# Patient Record
Sex: Female | Born: 1950 | Race: Black or African American | Hispanic: No | Smoking: Never smoker
Health system: Southern US, Community
[De-identification: ages and names within clinical notes are randomized; demographics above are authoritative.]

## PROBLEM LIST (undated history)

## (undated) DIAGNOSIS — R000 Tachycardia, unspecified: Secondary | ICD-10-CM

## (undated) DIAGNOSIS — I10 Essential (primary) hypertension: Secondary | ICD-10-CM

## (undated) DIAGNOSIS — D649 Anemia, unspecified: Secondary | ICD-10-CM

## (undated) DIAGNOSIS — G20A1 Parkinson's disease without dyskinesia, without mention of fluctuations: Secondary | ICD-10-CM

## (undated) DIAGNOSIS — R600 Localized edema: Secondary | ICD-10-CM

## (undated) DIAGNOSIS — M5020 Other cervical disc displacement, unspecified cervical region: Secondary | ICD-10-CM

## (undated) DIAGNOSIS — F32A Depression, unspecified: Secondary | ICD-10-CM

## (undated) DIAGNOSIS — E039 Hypothyroidism, unspecified: Secondary | ICD-10-CM

## (undated) DIAGNOSIS — T8859XA Other complications of anesthesia, initial encounter: Secondary | ICD-10-CM

## (undated) DIAGNOSIS — T4145XA Adverse effect of unspecified anesthetic, initial encounter: Secondary | ICD-10-CM

## (undated) DIAGNOSIS — E538 Deficiency of other specified B group vitamins: Secondary | ICD-10-CM

## (undated) DIAGNOSIS — Z923 Personal history of irradiation: Secondary | ICD-10-CM

## (undated) DIAGNOSIS — G4730 Sleep apnea, unspecified: Secondary | ICD-10-CM

## (undated) DIAGNOSIS — K219 Gastro-esophageal reflux disease without esophagitis: Secondary | ICD-10-CM

## (undated) DIAGNOSIS — K5792 Diverticulitis of intestine, part unspecified, without perforation or abscess without bleeding: Secondary | ICD-10-CM

## (undated) DIAGNOSIS — L732 Hidradenitis suppurativa: Secondary | ICD-10-CM

## (undated) DIAGNOSIS — C50019 Malignant neoplasm of nipple and areola, unspecified female breast: Secondary | ICD-10-CM

## (undated) DIAGNOSIS — M1990 Unspecified osteoarthritis, unspecified site: Secondary | ICD-10-CM

## (undated) DIAGNOSIS — M5030 Other cervical disc degeneration, unspecified cervical region: Secondary | ICD-10-CM

## (undated) DIAGNOSIS — G629 Polyneuropathy, unspecified: Secondary | ICD-10-CM

## (undated) DIAGNOSIS — G20 Parkinson's disease: Secondary | ICD-10-CM

## (undated) DIAGNOSIS — G4733 Obstructive sleep apnea (adult) (pediatric): Secondary | ICD-10-CM

## (undated) DIAGNOSIS — I4891 Unspecified atrial fibrillation: Secondary | ICD-10-CM

## (undated) DIAGNOSIS — Z9010 Acquired absence of unspecified breast and nipple: Secondary | ICD-10-CM

## (undated) DIAGNOSIS — M109 Gout, unspecified: Secondary | ICD-10-CM

## (undated) DIAGNOSIS — Z9221 Personal history of antineoplastic chemotherapy: Secondary | ICD-10-CM

## (undated) DIAGNOSIS — F329 Major depressive disorder, single episode, unspecified: Secondary | ICD-10-CM

## (undated) DIAGNOSIS — R0600 Dyspnea, unspecified: Secondary | ICD-10-CM

## (undated) DIAGNOSIS — Z90710 Acquired absence of both cervix and uterus: Secondary | ICD-10-CM

## (undated) DIAGNOSIS — C50919 Malignant neoplasm of unspecified site of unspecified female breast: Secondary | ICD-10-CM

## (undated) DIAGNOSIS — Z98891 History of uterine scar from previous surgery: Secondary | ICD-10-CM

## (undated) DIAGNOSIS — E669 Obesity, unspecified: Secondary | ICD-10-CM

## (undated) DIAGNOSIS — F419 Anxiety disorder, unspecified: Secondary | ICD-10-CM

## (undated) HISTORY — PX: OTHER SURGICAL HISTORY: SHX169

## (undated) HISTORY — PX: EYE SURGERY: SHX253

## (undated) HISTORY — PX: NEUROPLASTY / TRANSPOSITION ULNAR NERVE AT ELBOW: SUR895

## (undated) HISTORY — PX: SPINE SURGERY: SHX786

## (undated) HISTORY — PX: ABDOMINAL HYSTERECTOMY: SHX81

## (undated) HISTORY — PX: REDUCTION MAMMAPLASTY: SUR839

## (undated) HISTORY — PX: LAPAROSCOPIC GASTRIC BYPASS: SUR771

## (undated) HISTORY — PX: BREAST CYST ASPIRATION: SHX578

## (undated) HISTORY — PX: BREAST BIOPSY: SHX20

## (undated) HISTORY — PX: CHOLECYSTECTOMY: SHX55

## (undated) HISTORY — PX: MASTECTOMY: SHX3

## (undated) HISTORY — PX: OOPHORECTOMY: SHX86

## (undated) HISTORY — DX: Malignant neoplasm of unspecified site of unspecified female breast: C50.919

## (undated) HISTORY — PX: MASTECTOMY MODIFIED RADICAL: SHX5962

## (undated) HISTORY — PX: ESOPHAGOGASTRODUODENOSCOPY: SHX1529

---
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
## 1985-09-19 DIAGNOSIS — Z98891 History of uterine scar from previous surgery: Secondary | ICD-10-CM

## 1985-09-19 HISTORY — DX: History of uterine scar from previous surgery: Z98.891

## 1989-09-19 DIAGNOSIS — C50919 Malignant neoplasm of unspecified site of unspecified female breast: Secondary | ICD-10-CM

## 1989-09-19 HISTORY — DX: Malignant neoplasm of unspecified site of unspecified female breast: C50.919

## 1994-09-19 DIAGNOSIS — Z90710 Acquired absence of both cervix and uterus: Secondary | ICD-10-CM

## 1994-09-19 HISTORY — DX: Acquired absence of both cervix and uterus: Z90.710

## 2000-09-19 DIAGNOSIS — M5020 Other cervical disc displacement, unspecified cervical region: Secondary | ICD-10-CM

## 2000-09-19 HISTORY — PX: BACK SURGERY: SHX140

## 2000-09-19 HISTORY — DX: Other cervical disc displacement, unspecified cervical region: M50.20

## 2001-05-23 ENCOUNTER — Encounter: Admission: RE | Admit: 2001-05-23 | Discharge: 2001-05-23 | Payer: Self-pay | Admitting: *Deleted

## 2001-09-11 ENCOUNTER — Encounter: Payer: Self-pay | Admitting: Neurosurgery

## 2001-09-17 ENCOUNTER — Ambulatory Visit (HOSPITAL_COMMUNITY): Admission: RE | Admit: 2001-09-17 | Discharge: 2001-09-18 | Payer: Self-pay | Admitting: Neurosurgery

## 2001-09-17 ENCOUNTER — Encounter: Payer: Self-pay | Admitting: Neurosurgery

## 2004-10-20 ENCOUNTER — Ambulatory Visit: Payer: Self-pay | Admitting: Surgery

## 2005-03-28 ENCOUNTER — Ambulatory Visit: Payer: Self-pay | Admitting: Surgery

## 2005-05-06 ENCOUNTER — Ambulatory Visit: Payer: Self-pay | Admitting: Urology

## 2005-05-10 ENCOUNTER — Ambulatory Visit: Payer: Self-pay | Admitting: Unknown Physician Specialty

## 2005-11-09 ENCOUNTER — Ambulatory Visit: Payer: Self-pay | Admitting: Surgery

## 2006-05-10 ENCOUNTER — Ambulatory Visit: Payer: Self-pay | Admitting: Surgery

## 2006-10-16 ENCOUNTER — Ambulatory Visit: Payer: Self-pay | Admitting: Internal Medicine

## 2006-11-09 ENCOUNTER — Emergency Department: Payer: Self-pay | Admitting: Emergency Medicine

## 2006-11-23 ENCOUNTER — Ambulatory Visit: Payer: Self-pay | Admitting: Surgery

## 2007-12-10 ENCOUNTER — Ambulatory Visit: Payer: Self-pay | Admitting: Surgery

## 2008-10-10 ENCOUNTER — Ambulatory Visit: Payer: Self-pay | Admitting: Internal Medicine

## 2008-12-24 ENCOUNTER — Ambulatory Visit: Payer: Self-pay | Admitting: Surgery

## 2009-01-17 DIAGNOSIS — T8859XA Other complications of anesthesia, initial encounter: Secondary | ICD-10-CM

## 2009-01-17 HISTORY — DX: Other complications of anesthesia, initial encounter: T88.59XA

## 2009-01-26 HISTORY — PX: ROUX-EN-Y GASTRIC BYPASS: SHX1104

## 2009-03-09 ENCOUNTER — Ambulatory Visit: Payer: Self-pay | Admitting: Internal Medicine

## 2009-10-28 ENCOUNTER — Ambulatory Visit: Payer: Self-pay | Admitting: Internal Medicine

## 2009-11-11 ENCOUNTER — Ambulatory Visit: Payer: Self-pay | Admitting: Internal Medicine

## 2009-11-13 ENCOUNTER — Ambulatory Visit: Payer: Self-pay | Admitting: Internal Medicine

## 2009-11-16 ENCOUNTER — Ambulatory Visit: Payer: Self-pay | Admitting: Internal Medicine

## 2009-11-19 ENCOUNTER — Ambulatory Visit: Payer: Self-pay | Admitting: Internal Medicine

## 2009-11-23 ENCOUNTER — Ambulatory Visit: Payer: Self-pay | Admitting: Internal Medicine

## 2009-11-26 ENCOUNTER — Ambulatory Visit: Payer: Self-pay | Admitting: Internal Medicine

## 2009-11-30 ENCOUNTER — Ambulatory Visit: Payer: Self-pay | Admitting: Internal Medicine

## 2009-12-03 ENCOUNTER — Ambulatory Visit: Payer: Self-pay | Admitting: Internal Medicine

## 2009-12-07 ENCOUNTER — Ambulatory Visit: Payer: Self-pay | Admitting: Internal Medicine

## 2009-12-11 ENCOUNTER — Ambulatory Visit: Payer: Self-pay | Admitting: Internal Medicine

## 2009-12-15 ENCOUNTER — Ambulatory Visit: Payer: Self-pay | Admitting: Internal Medicine

## 2009-12-18 ENCOUNTER — Ambulatory Visit: Payer: Self-pay | Admitting: Internal Medicine

## 2009-12-22 ENCOUNTER — Ambulatory Visit: Payer: Self-pay | Admitting: Internal Medicine

## 2009-12-25 ENCOUNTER — Ambulatory Visit: Payer: Self-pay | Admitting: Internal Medicine

## 2009-12-29 ENCOUNTER — Ambulatory Visit: Payer: Self-pay | Admitting: Internal Medicine

## 2009-12-30 ENCOUNTER — Ambulatory Visit: Payer: Self-pay | Admitting: Surgery

## 2010-01-04 ENCOUNTER — Ambulatory Visit: Payer: Self-pay | Admitting: Internal Medicine

## 2010-01-07 ENCOUNTER — Ambulatory Visit: Payer: Self-pay | Admitting: Internal Medicine

## 2010-01-12 ENCOUNTER — Ambulatory Visit: Payer: Self-pay | Admitting: Internal Medicine

## 2010-01-19 ENCOUNTER — Ambulatory Visit: Payer: Self-pay | Admitting: Internal Medicine

## 2010-01-26 ENCOUNTER — Ambulatory Visit: Payer: Self-pay | Admitting: Internal Medicine

## 2010-02-02 ENCOUNTER — Ambulatory Visit: Payer: Self-pay | Admitting: Internal Medicine

## 2010-02-12 ENCOUNTER — Ambulatory Visit: Payer: Self-pay | Admitting: Internal Medicine

## 2010-03-26 ENCOUNTER — Ambulatory Visit: Payer: Self-pay | Admitting: Internal Medicine

## 2010-08-20 ENCOUNTER — Ambulatory Visit: Payer: Self-pay | Admitting: Internal Medicine

## 2010-09-17 ENCOUNTER — Ambulatory Visit: Payer: Self-pay | Admitting: Internal Medicine

## 2010-09-19 ENCOUNTER — Ambulatory Visit: Payer: Self-pay | Admitting: Internal Medicine

## 2010-09-24 ENCOUNTER — Ambulatory Visit: Payer: Self-pay | Admitting: Internal Medicine

## 2010-10-01 ENCOUNTER — Ambulatory Visit: Payer: Self-pay | Admitting: Internal Medicine

## 2010-10-18 ENCOUNTER — Ambulatory Visit: Payer: Self-pay | Admitting: Unknown Physician Specialty

## 2010-10-20 ENCOUNTER — Ambulatory Visit: Payer: Self-pay | Admitting: Internal Medicine

## 2010-10-22 LAB — PATHOLOGY REPORT

## 2010-11-18 ENCOUNTER — Ambulatory Visit: Payer: Self-pay | Admitting: Internal Medicine

## 2010-12-19 ENCOUNTER — Ambulatory Visit: Payer: Self-pay | Admitting: Internal Medicine

## 2010-12-30 ENCOUNTER — Ambulatory Visit: Payer: Self-pay | Admitting: Internal Medicine

## 2011-01-05 ENCOUNTER — Ambulatory Visit: Payer: Self-pay | Admitting: Surgery

## 2011-01-18 ENCOUNTER — Ambulatory Visit: Payer: Self-pay

## 2011-01-18 ENCOUNTER — Ambulatory Visit: Payer: Self-pay | Admitting: Internal Medicine

## 2011-02-24 ENCOUNTER — Ambulatory Visit: Payer: Self-pay | Admitting: Internal Medicine

## 2011-03-20 ENCOUNTER — Ambulatory Visit: Payer: Self-pay | Admitting: Internal Medicine

## 2011-04-20 ENCOUNTER — Ambulatory Visit: Payer: Self-pay | Admitting: Internal Medicine

## 2011-05-30 ENCOUNTER — Ambulatory Visit: Payer: Self-pay | Admitting: Internal Medicine

## 2011-06-20 ENCOUNTER — Ambulatory Visit: Payer: Self-pay | Admitting: Internal Medicine

## 2011-07-21 ENCOUNTER — Ambulatory Visit: Payer: Self-pay | Admitting: Internal Medicine

## 2011-10-17 ENCOUNTER — Ambulatory Visit: Payer: Self-pay | Admitting: Internal Medicine

## 2011-10-17 LAB — CBC CANCER CENTER
Basophil %: 1.7 %
Eosinophil %: 1.6 %
HCT: 41 % (ref 35.0–47.0)
HGB: 14.1 g/dL (ref 12.0–16.0)
Lymphocyte %: 54.8 %
MCH: 32.1 pg (ref 26.0–34.0)
Monocyte #: 1 x10 3/mm — ABNORMAL HIGH (ref 0.0–0.7)
Monocyte %: 10.9 %
Neutrophil %: 31 %
RBC: 4.39 10*6/uL (ref 3.80–5.20)
WBC: 9.2 x10 3/mm (ref 3.6–11.0)

## 2011-10-17 LAB — LACTATE DEHYDROGENASE: LDH: 270 U/L — ABNORMAL HIGH (ref 84–246)

## 2011-10-17 LAB — CREATININE, SERUM
Creatinine: 0.87 mg/dL (ref 0.60–1.30)
EGFR (Non-African Amer.): >60

## 2011-10-21 ENCOUNTER — Ambulatory Visit: Payer: Self-pay | Admitting: Internal Medicine

## 2012-01-16 ENCOUNTER — Ambulatory Visit: Payer: Self-pay | Admitting: Internal Medicine

## 2012-01-16 LAB — CBC CANCER CENTER
Basophil #: 0.1 x10 3/mm (ref 0.0–0.1)
Basophil %: 0.8 %
Eosinophil %: 2 %
HGB: 13.2 g/dL (ref 12.0–16.0)
MCH: 31 pg (ref 26.0–34.0)
MCHC: 33.2 g/dL (ref 32.0–36.0)
Monocyte #: 0.8 x10 3/mm (ref 0.2–0.9)
Monocyte %: 10 %
Neutrophil #: 2.8 x10 3/mm (ref 1.4–6.5)
Neutrophil %: 33.5 %
Platelet: 259 x10 3/mm (ref 150–440)
WBC: 8.5 x10 3/mm (ref 3.6–11.0)

## 2012-01-16 LAB — CREATININE, SERUM: Creatinine: 0.66 mg/dL (ref 0.60–1.30)

## 2012-01-16 LAB — URIC ACID: Uric Acid: 2.8 mg/dL (ref 2.6–6.0)

## 2012-01-18 ENCOUNTER — Ambulatory Visit: Payer: Self-pay | Admitting: Internal Medicine

## 2012-02-02 ENCOUNTER — Ambulatory Visit: Payer: Self-pay | Admitting: Surgery

## 2012-02-14 ENCOUNTER — Ambulatory Visit: Payer: Self-pay | Admitting: Surgery

## 2012-02-15 ENCOUNTER — Ambulatory Visit: Payer: Self-pay | Admitting: Internal Medicine

## 2012-02-15 LAB — CBC CANCER CENTER
Basophil #: 0.1 x10 3/mm (ref 0.0–0.1)
Basophil %: 0.8 %
Eosinophil #: 0.1 x10 3/mm (ref 0.0–0.7)
Eosinophil %: 1.6 %
HCT: 39 % (ref 35.0–47.0)
HGB: 13 g/dL (ref 12.0–16.0)
MCH: 31.2 pg (ref 26.0–34.0)
MCHC: 33.4 g/dL (ref 32.0–36.0)
Monocyte #: 1 x10 3/mm — ABNORMAL HIGH (ref 0.2–0.9)
Monocyte %: 12.2 %
Neutrophil #: 2.7 x10 3/mm (ref 1.4–6.5)

## 2012-02-18 ENCOUNTER — Ambulatory Visit: Payer: Self-pay | Admitting: Internal Medicine

## 2012-03-12 ENCOUNTER — Ambulatory Visit: Payer: Self-pay | Admitting: Internal Medicine

## 2012-03-26 DIAGNOSIS — F331 Major depressive disorder, recurrent, moderate: Secondary | ICD-10-CM | POA: Insufficient documentation

## 2012-04-09 HISTORY — PX: CHOLECYSTECTOMY: SHX55

## 2012-05-17 ENCOUNTER — Ambulatory Visit: Payer: Self-pay | Admitting: Internal Medicine

## 2012-05-17 LAB — CREATININE, SERUM
Creatinine: 0.87 mg/dL (ref 0.60–1.30)
EGFR (African American): >60
EGFR (Non-African Amer.): >60

## 2012-05-17 LAB — CBC CANCER CENTER
Basophil #: 0.1 x10 3/mm (ref 0.0–0.1)
Basophil %: 1 %
Eosinophil #: 0.2 x10 3/mm (ref 0.0–0.7)
Eosinophil %: 2.4 %
HCT: 41.1 % (ref 35.0–47.0)
HGB: 13.3 g/dL (ref 12.0–16.0)
Lymphocyte #: 4.5 x10 3/mm — ABNORMAL HIGH (ref 1.0–3.6)
Lymphocyte %: 54.7 %
MCH: 30.7 pg (ref 26.0–34.0)
MCHC: 32.4 g/dL (ref 32.0–36.0)
MCV: 95 fL (ref 80–100)
Monocyte #: 0.9 x10 3/mm (ref 0.2–0.9)
Monocyte %: 10.8 %
Neutrophil #: 2.5 x10 3/mm (ref 1.4–6.5)
Neutrophil %: 31.1 %
Platelet: 259 x10 3/mm (ref 150–440)
RBC: 4.33 10*6/uL (ref 3.80–5.20)
RDW: 14.7 % — ABNORMAL HIGH (ref 11.5–14.5)
WBC: 8.2 x10 3/mm (ref 3.6–11.0)

## 2012-05-17 LAB — URIC ACID: Uric Acid: 2.8 mg/dL (ref 2.6–6.0)

## 2012-05-17 LAB — LACTATE DEHYDROGENASE: LDH: 225 U/L (ref 81–234)

## 2012-05-20 ENCOUNTER — Ambulatory Visit: Payer: Self-pay | Admitting: Internal Medicine

## 2012-06-04 DIAGNOSIS — E669 Obesity, unspecified: Secondary | ICD-10-CM | POA: Insufficient documentation

## 2012-06-12 ENCOUNTER — Ambulatory Visit: Payer: Self-pay | Admitting: Internal Medicine

## 2012-06-14 ENCOUNTER — Ambulatory Visit: Payer: Self-pay | Admitting: Internal Medicine

## 2012-06-19 ENCOUNTER — Ambulatory Visit: Payer: Self-pay | Admitting: Internal Medicine

## 2012-07-23 ENCOUNTER — Ambulatory Visit: Payer: Self-pay | Admitting: Internal Medicine

## 2012-07-23 LAB — CREATININE, SERUM
Creatinine: 0.9 mg/dL (ref 0.60–1.30)
EGFR (African American): >60

## 2012-07-23 LAB — CBC CANCER CENTER
Basophil #: 0.1 x10 3/mm (ref 0.0–0.1)
Eosinophil #: 0.1 x10 3/mm (ref 0.0–0.7)
HGB: 13.4 g/dL (ref 12.0–16.0)
Lymphocyte #: 3.9 x10 3/mm — ABNORMAL HIGH (ref 1.0–3.6)
MCH: 31.3 pg (ref 26.0–34.0)
MCHC: 32.8 g/dL (ref 32.0–36.0)
MCV: 96 fL (ref 80–100)
Monocyte #: 1.1 x10 3/mm — ABNORMAL HIGH (ref 0.2–0.9)
Monocyte %: 12 %
Platelet: 264 x10 3/mm (ref 150–440)
RDW: 15.1 % — ABNORMAL HIGH (ref 11.5–14.5)
WBC: 8.9 x10 3/mm (ref 3.6–11.0)

## 2012-07-23 LAB — URIC ACID: Uric Acid: 2.8 mg/dL (ref 2.6–6.0)

## 2012-07-23 LAB — LACTATE DEHYDROGENASE: LDH: 228 U/L (ref 81–246)

## 2012-08-19 ENCOUNTER — Ambulatory Visit: Payer: Self-pay | Admitting: Internal Medicine

## 2012-08-20 LAB — CBC CANCER CENTER
Basophil %: 0.7 %
Eosinophil #: 0.1 x10 3/mm (ref 0.0–0.7)
Eosinophil %: 1.3 %
HCT: 38.8 % (ref 35.0–47.0)
Lymphocyte %: 51.4 %
MCH: 31.7 pg (ref 26.0–34.0)
MCV: 93 fL (ref 80–100)
Monocyte #: 0.9 x10 3/mm (ref 0.2–0.9)
Monocyte %: 10.6 %
Platelet: 267 x10 3/mm (ref 150–440)
RDW: 14.9 % — ABNORMAL HIGH (ref 11.5–14.5)

## 2012-09-10 LAB — CBC CANCER CENTER
Basophil #: 0.1 x10 3/mm (ref 0.0–0.1)
Basophil %: 0.7 %
HGB: 13.6 g/dL (ref 12.0–16.0)
Lymphocyte #: 4.3 x10 3/mm — ABNORMAL HIGH (ref 1.0–3.6)
MCHC: 33.9 g/dL (ref 32.0–36.0)
MCV: 93 fL (ref 80–100)
Monocyte #: 1 x10 3/mm — ABNORMAL HIGH (ref 0.2–0.9)
Monocyte %: 12.3 %
Neutrophil #: 2.6 x10 3/mm (ref 1.4–6.5)
RBC: 4.28 10*6/uL (ref 3.80–5.20)
RDW: 14.8 % — ABNORMAL HIGH (ref 11.5–14.5)
WBC: 8.1 x10 3/mm (ref 3.6–11.0)

## 2012-09-10 LAB — LACTATE DEHYDROGENASE: LDH: 234 U/L (ref 81–246)

## 2012-09-19 ENCOUNTER — Ambulatory Visit: Payer: Self-pay | Admitting: Internal Medicine

## 2012-09-19 DIAGNOSIS — Z9010 Acquired absence of unspecified breast and nipple: Secondary | ICD-10-CM

## 2012-09-19 HISTORY — DX: Acquired absence of unspecified breast and nipple: Z90.10

## 2012-09-19 HISTORY — PX: BREAST SURGERY: SHX581

## 2012-09-24 ENCOUNTER — Ambulatory Visit: Payer: Self-pay | Admitting: Surgery

## 2012-10-04 ENCOUNTER — Ambulatory Visit: Payer: Self-pay | Admitting: Surgery

## 2012-10-04 LAB — PROTIME-INR
INR: 1
Prothrombin Time: 13.3 secs (ref 11.5–14.7)

## 2012-10-11 LAB — PATHOLOGY REPORT

## 2012-10-20 ENCOUNTER — Ambulatory Visit: Payer: Self-pay | Admitting: Internal Medicine

## 2012-10-22 LAB — CBC CANCER CENTER
Basophil #: 0.1 x10 3/mm (ref 0.0–0.1)
Eosinophil #: 0.1 x10 3/mm (ref 0.0–0.7)
Eosinophil %: 1.5 %
HCT: 41.3 % (ref 35.0–47.0)
HGB: 13.9 g/dL (ref 12.0–16.0)
Lymphocyte #: 4.4 x10 3/mm — ABNORMAL HIGH (ref 1.0–3.6)
Lymphocyte %: 50.8 %
MCH: 31.8 pg (ref 26.0–34.0)
MCHC: 33.7 g/dL (ref 32.0–36.0)
MCV: 95 fL (ref 80–100)
WBC: 8.6 x10 3/mm (ref 3.6–11.0)

## 2012-10-29 ENCOUNTER — Ambulatory Visit: Payer: Self-pay | Admitting: Internal Medicine

## 2012-10-31 LAB — COMPREHENSIVE METABOLIC PANEL
Alkaline Phosphatase: 125 U/L (ref 50–136)
Anion Gap: 8 (ref 7–16)
BUN: 19 mg/dL — ABNORMAL HIGH (ref 7–18)
Calcium, Total: 8.6 mg/dL (ref 8.5–10.1)
Chloride: 105 mmol/L (ref 98–107)
Co2: 28 mmol/L (ref 21–32)
Creatinine: 1.05 mg/dL (ref 0.60–1.30)
EGFR (African American): >60
Osmolality: 283 (ref 275–301)
SGPT (ALT): 29 U/L (ref 12–78)
Sodium: 141 mmol/L (ref 136–145)

## 2012-11-02 LAB — CEA: CEA: 2.8 ng/mL (ref 0.0–4.7)

## 2012-11-02 LAB — CANCER ANTIGEN 27.29: CA 27.29: 28.9 U/mL (ref 0.0–38.6)

## 2012-11-13 LAB — CREATININE, SERUM
EGFR (African American): >60
EGFR (Non-African Amer.): >60

## 2012-11-17 ENCOUNTER — Ambulatory Visit: Payer: Self-pay | Admitting: Internal Medicine

## 2012-11-22 ENCOUNTER — Ambulatory Visit: Payer: Self-pay | Admitting: Internal Medicine

## 2012-11-29 ENCOUNTER — Ambulatory Visit: Payer: Self-pay | Admitting: Surgery

## 2012-12-05 ENCOUNTER — Ambulatory Visit: Payer: Self-pay | Admitting: Surgery

## 2012-12-05 LAB — PROTIME-INR
INR: 1.1
Prothrombin Time: 14.3 secs (ref 11.5–14.7)

## 2012-12-07 LAB — PATHOLOGY REPORT

## 2012-12-17 ENCOUNTER — Observation Stay: Payer: Self-pay | Admitting: Internal Medicine

## 2012-12-17 LAB — CBC WITH DIFFERENTIAL/PLATELET
Basophil #: 0.1 10*3/uL (ref 0.0–0.1)
Eosinophil #: 0.4 10*3/uL (ref 0.0–0.7)
HCT: 37.8 % (ref 35.0–47.0)
HGB: 12.7 g/dL (ref 12.0–16.0)
MCHC: 33.7 g/dL (ref 32.0–36.0)
MCV: 95 fL (ref 80–100)
Monocyte #: 1 x10 3/mm — ABNORMAL HIGH (ref 0.2–0.9)
Monocyte %: 9.1 %
Neutrophil %: 44 %
RDW: 14.3 % (ref 11.5–14.5)

## 2012-12-17 LAB — BASIC METABOLIC PANEL
Anion Gap: 5 — ABNORMAL LOW (ref 7–16)
Calcium, Total: 8.4 mg/dL — ABNORMAL LOW (ref 8.5–10.1)
Chloride: 108 mmol/L — ABNORMAL HIGH (ref 98–107)
Creatinine: 0.61 mg/dL (ref 0.60–1.30)
EGFR (Non-African Amer.): >60
Potassium: 3.8 mmol/L (ref 3.5–5.1)
Sodium: 142 mmol/L (ref 136–145)

## 2012-12-17 LAB — PROTIME-INR: INR: 1.6

## 2012-12-19 ENCOUNTER — Ambulatory Visit: Payer: Self-pay | Admitting: Internal Medicine

## 2013-01-17 ENCOUNTER — Ambulatory Visit: Payer: Self-pay | Admitting: Internal Medicine

## 2013-01-24 ENCOUNTER — Ambulatory Visit: Payer: Self-pay | Admitting: Orthopedic Surgery

## 2013-01-31 ENCOUNTER — Ambulatory Visit: Payer: Self-pay | Admitting: Orthopedic Surgery

## 2013-02-17 ENCOUNTER — Ambulatory Visit: Payer: Self-pay | Admitting: Internal Medicine

## 2013-02-18 LAB — COMPREHENSIVE METABOLIC PANEL
Albumin: 3.2 g/dL — ABNORMAL LOW (ref 3.4–5.0)
Alkaline Phosphatase: 107 U/L (ref 50–136)
Anion Gap: 8 (ref 7–16)
BUN: 13 mg/dL (ref 7–18)
Chloride: 105 mmol/L (ref 98–107)
Co2: 28 mmol/L (ref 21–32)
Creatinine: 0.8 mg/dL (ref 0.60–1.30)
EGFR (African American): >60
EGFR (Non-African Amer.): >60
Osmolality: 281 (ref 275–301)
Potassium: 3.9 mmol/L (ref 3.5–5.1)
Sodium: 141 mmol/L (ref 136–145)

## 2013-02-18 LAB — CBC CANCER CENTER
Eosinophil #: 0.2 x10 3/mm (ref 0.0–0.7)
Eosinophil %: 2.2 %
HCT: 37.9 % (ref 35.0–47.0)
Lymphocyte #: 5.1 x10 3/mm — ABNORMAL HIGH (ref 1.0–3.6)
Lymphocyte %: 50.3 %
Monocyte %: 11.4 %
Neutrophil %: 35.6 %
Platelet: 261 x10 3/mm (ref 150–440)
RDW: 14.7 % — ABNORMAL HIGH (ref 11.5–14.5)
WBC: 10.2 x10 3/mm (ref 3.6–11.0)

## 2013-02-18 LAB — PROTIME-INR
INR: 2.8
Prothrombin Time: 29 secs — ABNORMAL HIGH (ref 11.5–14.7)

## 2013-02-25 LAB — CBC CANCER CENTER
Basophil #: 0 x10 3/mm (ref 0.0–0.1)
Eosinophil #: 0.2 x10 3/mm (ref 0.0–0.7)
HGB: 12.8 g/dL (ref 12.0–16.0)
Lymphocyte #: 3.6 x10 3/mm (ref 1.0–3.6)
Lymphocyte %: 36.2 %
Monocyte %: 5.6 %
Neutrophil %: 56.5 %
Platelet: 207 x10 3/mm (ref 150–440)
RBC: 3.96 10*6/uL (ref 3.80–5.20)
RDW: 14.4 % (ref 11.5–14.5)

## 2013-02-25 LAB — PROTIME-INR: Prothrombin Time: 22.6 secs — ABNORMAL HIGH (ref 11.5–14.7)

## 2013-02-27 LAB — CBC CANCER CENTER
Basophil %: 0.7 %
Eosinophil #: 0.1 x10 3/mm (ref 0.0–0.7)
HCT: 35.5 % (ref 35.0–47.0)
HGB: 12.3 g/dL (ref 12.0–16.0)
Lymphocyte %: 58.7 %
MCHC: 34.6 g/dL (ref 32.0–36.0)
MCV: 93 fL (ref 80–100)
Monocyte %: 8.7 %
Neutrophil #: 2.3 x10 3/mm (ref 1.4–6.5)
Platelet: 212 x10 3/mm (ref 150–440)
RBC: 3.84 10*6/uL (ref 3.80–5.20)
RDW: 14.2 % (ref 11.5–14.5)
WBC: 7.6 x10 3/mm (ref 3.6–11.0)

## 2013-03-01 LAB — CBC CANCER CENTER
Basophil %: 0.8 %
Eosinophil %: 1 %
HCT: 36.5 % (ref 35.0–47.0)
Lymphocyte %: 51.7 %
MCH: 32.1 pg (ref 26.0–34.0)
MCHC: 34.5 g/dL (ref 32.0–36.0)
Monocyte #: 0.7 x10 3/mm (ref 0.2–0.9)
Neutrophil %: 38 %
Platelet: 228 x10 3/mm (ref 150–440)
WBC: 8.2 x10 3/mm (ref 3.6–11.0)

## 2013-03-01 LAB — PROTIME-INR: Prothrombin Time: 25.5 secs — ABNORMAL HIGH (ref 11.5–14.7)

## 2013-03-04 LAB — CBC CANCER CENTER
Basophil %: 0.7 %
Eosinophil #: 0.1 x10 3/mm (ref 0.0–0.7)
Eosinophil %: 0.7 %
HCT: 37.1 % (ref 35.0–47.0)
HGB: 12.9 g/dL (ref 12.0–16.0)
Lymphocyte #: 4.8 x10 3/mm — ABNORMAL HIGH (ref 1.0–3.6)
MCH: 32.2 pg (ref 26.0–34.0)
MCV: 93 fL (ref 80–100)
Monocyte #: 1 x10 3/mm — ABNORMAL HIGH (ref 0.2–0.9)
Neutrophil #: 4.7 x10 3/mm (ref 1.4–6.5)
Neutrophil %: 43.7 %
RBC: 4 10*6/uL (ref 3.80–5.20)
RDW: 14.6 % — ABNORMAL HIGH (ref 11.5–14.5)
WBC: 10.7 x10 3/mm (ref 3.6–11.0)

## 2013-03-04 LAB — PROTIME-INR
INR: 2.8
Prothrombin Time: 29 secs — ABNORMAL HIGH (ref 11.5–14.7)

## 2013-03-11 LAB — CBC CANCER CENTER
Eosinophil #: 0 x10 3/mm (ref 0.0–0.7)
HCT: 38.9 % (ref 35.0–47.0)
HGB: 13 g/dL (ref 12.0–16.0)
Lymphocyte %: 43 %
MCH: 31 pg (ref 26.0–34.0)
MCHC: 33.5 g/dL (ref 32.0–36.0)
MCV: 93 fL (ref 80–100)
Monocyte %: 11.6 %
Neutrophil #: 5.9 x10 3/mm (ref 1.4–6.5)
Neutrophil %: 44.9 %
Platelet: 264 x10 3/mm (ref 150–440)
RBC: 4.2 10*6/uL (ref 3.80–5.20)
RDW: 15.4 % — ABNORMAL HIGH (ref 11.5–14.5)
WBC: 13.3 x10 3/mm — ABNORMAL HIGH (ref 3.6–11.0)

## 2013-03-11 LAB — COMPREHENSIVE METABOLIC PANEL
Anion Gap: 5 — ABNORMAL LOW (ref 7–16)
BUN: 20 mg/dL — ABNORMAL HIGH (ref 7–18)
Bilirubin,Total: 0.3 mg/dL (ref 0.2–1.0)
Chloride: 103 mmol/L (ref 98–107)
Co2: 31 mmol/L (ref 21–32)
Creatinine: 0.99 mg/dL (ref 0.60–1.30)
EGFR (African American): >60
EGFR (Non-African Amer.): >60
Glucose: 99 mg/dL (ref 65–99)

## 2013-03-11 LAB — PROTIME-INR
INR: 3
Prothrombin Time: 30.1 secs — ABNORMAL HIGH (ref 11.5–14.7)

## 2013-03-15 LAB — PROTIME-INR
INR: 3.7
Prothrombin Time: 35.1 secs — ABNORMAL HIGH (ref 11.5–14.7)

## 2013-03-19 ENCOUNTER — Ambulatory Visit: Payer: Self-pay | Admitting: Internal Medicine

## 2013-03-20 LAB — CBC CANCER CENTER
Eosinophil #: 0.1 x10 3/mm (ref 0.0–0.7)
Eosinophil %: 1.1 %
HCT: 35.6 % (ref 35.0–47.0)
HGB: 12.2 g/dL (ref 12.0–16.0)
Lymphocyte #: 3.7 x10 3/mm — ABNORMAL HIGH (ref 1.0–3.6)
MCHC: 34.2 g/dL (ref 32.0–36.0)
MCV: 93 fL (ref 80–100)
Monocyte %: 13.3 %
Neutrophil #: 2.1 x10 3/mm (ref 1.4–6.5)
Platelet: 221 x10 3/mm (ref 150–440)
RBC: 3.84 10*6/uL (ref 3.80–5.20)

## 2013-03-20 LAB — PROTIME-INR: INR: 1.9

## 2013-03-25 LAB — CBC CANCER CENTER
Eosinophil #: 0.1 x10 3/mm (ref 0.0–0.7)
Eosinophil %: 1.4 %
Lymphocyte %: 42.8 %
MCH: 32.4 pg (ref 26.0–34.0)
MCHC: 34.8 g/dL (ref 32.0–36.0)
MCV: 93 fL (ref 80–100)
Monocyte #: 0.9 x10 3/mm (ref 0.2–0.9)
Monocyte %: 10.7 %
Neutrophil #: 3.5 x10 3/mm (ref 1.4–6.5)
Neutrophil %: 44.2 %
Platelet: 174 x10 3/mm (ref 150–440)
RBC: 3.73 10*6/uL — ABNORMAL LOW (ref 3.80–5.20)
WBC: 8 x10 3/mm (ref 3.6–11.0)

## 2013-03-25 LAB — BASIC METABOLIC PANEL
Anion Gap: 9 (ref 7–16)
BUN: 11 mg/dL (ref 7–18)
Chloride: 105 mmol/L (ref 98–107)
Co2: 29 mmol/L (ref 21–32)
EGFR (African American): >60
Glucose: 95 mg/dL (ref 65–99)
Sodium: 143 mmol/L (ref 136–145)

## 2013-03-25 LAB — PROTIME-INR
INR: 2.7
Prothrombin Time: 27.9 secs — ABNORMAL HIGH (ref 11.5–14.7)

## 2013-03-27 ENCOUNTER — Ambulatory Visit: Payer: Self-pay | Admitting: Cardiology

## 2013-04-01 LAB — CREATININE, SERUM: EGFR (African American): >60

## 2013-04-01 LAB — CBC CANCER CENTER
Basophil #: 0 x10 3/mm (ref 0.0–0.1)
Eosinophil %: 0.8 %
HGB: 12.3 g/dL (ref 12.0–16.0)
Lymphocyte #: 3.4 x10 3/mm (ref 1.0–3.6)
MCV: 94 fL (ref 80–100)
Monocyte #: 1.3 x10 3/mm — ABNORMAL HIGH (ref 0.2–0.9)
Monocyte %: 14.5 %
Neutrophil %: 46.7 %
RDW: 16.9 % — ABNORMAL HIGH (ref 11.5–14.5)
WBC: 9 x10 3/mm (ref 3.6–11.0)

## 2013-04-01 LAB — HEPATIC FUNCTION PANEL A (ARMC)
Albumin: 3.2 g/dL — ABNORMAL LOW (ref 3.4–5.0)
Alkaline Phosphatase: 105 U/L (ref 50–136)
SGOT(AST): 31 U/L (ref 15–37)
SGPT (ALT): 27 U/L (ref 12–78)
Total Protein: 6.2 g/dL — ABNORMAL LOW (ref 6.4–8.2)

## 2013-04-01 LAB — PROTIME-INR
INR: 2.6
Prothrombin Time: 26.9 secs — ABNORMAL HIGH (ref 11.5–14.7)

## 2013-04-08 LAB — CBC CANCER CENTER
Basophil #: 0 x10 3/mm (ref 0.0–0.1)
Basophil %: 1 %
Eosinophil #: 0 x10 3/mm (ref 0.0–0.7)
Lymphocyte #: 1.8 x10 3/mm (ref 1.0–3.6)
Lymphocyte %: 50.4 %
MCHC: 34.3 g/dL (ref 32.0–36.0)
Monocyte #: 0.1 x10 3/mm — ABNORMAL LOW (ref 0.2–0.9)
Monocyte %: 2.7 %
Neutrophil #: 1.6 x10 3/mm (ref 1.4–6.5)
Neutrophil %: 44.8 %
Platelet: 136 x10 3/mm — ABNORMAL LOW (ref 150–440)
RDW: 16.7 % — ABNORMAL HIGH (ref 11.5–14.5)
WBC: 3.6 x10 3/mm (ref 3.6–11.0)

## 2013-04-08 LAB — PROTIME-INR
INR: 1.9
Prothrombin Time: 21.9 s — ABNORMAL HIGH

## 2013-04-08 LAB — CREATININE, SERUM
Creatinine: 0.62 mg/dL (ref 0.60–1.30)
EGFR (African American): >60

## 2013-04-08 LAB — POTASSIUM: Potassium: 3.7 mmol/L

## 2013-04-11 LAB — CBC CANCER CENTER
Basophil #: 0.1 x10 3/mm (ref 0.0–0.1)
Basophil %: 1.9 %
Eosinophil #: 0 x10 3/mm (ref 0.0–0.7)
Eosinophil %: 1.2 %
HGB: 11.4 g/dL — ABNORMAL LOW (ref 12.0–16.0)
Lymphocyte #: 2.7 x10 3/mm (ref 1.0–3.6)
Lymphocyte %: 71.7 %
MCH: 32.4 pg (ref 26.0–34.0)
MCHC: 35 g/dL (ref 32.0–36.0)
Monocyte #: 0.3 x10 3/mm (ref 0.2–0.9)
Monocyte %: 7.8 %
Neutrophil %: 17.4 %
RBC: 3.51 10*6/uL — ABNORMAL LOW (ref 3.80–5.20)
RDW: 16.4 % — ABNORMAL HIGH (ref 11.5–14.5)
WBC: 3.7 x10 3/mm (ref 3.6–11.0)

## 2013-04-11 LAB — PROTIME-INR
INR: 1.6
Prothrombin Time: 18.5 secs — ABNORMAL HIGH (ref 11.5–14.7)

## 2013-04-12 LAB — CBC CANCER CENTER
Basophil #: 0.1 x10 3/mm (ref 0.0–0.1)
Basophil %: 1.4 %
Eosinophil %: 0.6 %
HCT: 32.4 % — ABNORMAL LOW (ref 35.0–47.0)
Lymphocyte #: 2.9 x10 3/mm (ref 1.0–3.6)
Lymphocyte %: 58.6 %
MCH: 31.9 pg (ref 26.0–34.0)
MCHC: 34.3 g/dL (ref 32.0–36.0)
MCV: 93 fL (ref 80–100)
Monocyte #: 0.5 x10 3/mm (ref 0.2–0.9)
Neutrophil #: 1.5 x10 3/mm (ref 1.4–6.5)
Neutrophil %: 30.3 %
Platelet: 147 x10 3/mm — ABNORMAL LOW (ref 150–440)
RBC: 3.48 10*6/uL — ABNORMAL LOW (ref 3.80–5.20)
RDW: 16 % — ABNORMAL HIGH (ref 11.5–14.5)
WBC: 5 x10 3/mm (ref 3.6–11.0)

## 2013-04-12 LAB — PROTIME-INR: Prothrombin Time: 19.2 secs — ABNORMAL HIGH (ref 11.5–14.7)

## 2013-04-15 LAB — CBC CANCER CENTER
Basophil #: 0.1 x10 3/mm (ref 0.0–0.1)
Eosinophil #: 0.1 x10 3/mm (ref 0.0–0.7)
Lymphocyte #: 3.3 x10 3/mm (ref 1.0–3.6)
MCH: 32.4 pg (ref 26.0–34.0)
MCHC: 34.6 g/dL (ref 32.0–36.0)
Monocyte #: 0.9 x10 3/mm (ref 0.2–0.9)
Neutrophil %: 44.1 %
Platelet: 235 x10 3/mm (ref 150–440)
WBC: 7.7 x10 3/mm (ref 3.6–11.0)

## 2013-04-15 LAB — PROTIME-INR
INR: 2.1
Prothrombin Time: 23.4 secs — ABNORMAL HIGH (ref 11.5–14.7)

## 2013-04-15 LAB — MAGNESIUM: Magnesium: 1.4 mg/dL — ABNORMAL LOW

## 2013-04-15 LAB — CREATININE, SERUM
EGFR (African American): >60
EGFR (Non-African Amer.): >60

## 2013-04-18 LAB — POTASSIUM: Potassium: 3.6 mmol/L (ref 3.5–5.1)

## 2013-04-18 LAB — MAGNESIUM: Magnesium: 1.4 mg/dL — ABNORMAL LOW

## 2013-04-18 LAB — PROTIME-INR: INR: 2.5

## 2013-04-19 ENCOUNTER — Ambulatory Visit: Payer: Self-pay | Admitting: Internal Medicine

## 2013-04-22 ENCOUNTER — Ambulatory Visit: Payer: Self-pay | Admitting: Internal Medicine

## 2013-04-22 LAB — CBC CANCER CENTER
Basophil %: 1.1 %
Eosinophil #: 0 x10 3/mm (ref 0.0–0.7)
Eosinophil %: 0.2 %
HCT: 35.6 % (ref 35.0–47.0)
HGB: 12.2 g/dL (ref 12.0–16.0)
Lymphocyte %: 38.6 %
MCHC: 34.2 g/dL (ref 32.0–36.0)
MCV: 94 fL (ref 80–100)
Monocyte #: 1.4 x10 3/mm — ABNORMAL HIGH (ref 0.2–0.9)
Monocyte %: 16.2 %
Neutrophil %: 43.9 %
RBC: 3.77 10*6/uL — ABNORMAL LOW (ref 3.80–5.20)
RDW: 18.2 % — ABNORMAL HIGH (ref 11.5–14.5)
WBC: 8.3 x10 3/mm (ref 3.6–11.0)

## 2013-04-22 LAB — COMPREHENSIVE METABOLIC PANEL
Albumin: 3.3 g/dL — ABNORMAL LOW (ref 3.4–5.0)
Anion Gap: 7 (ref 7–16)
BUN: 13 mg/dL (ref 7–18)
Bilirubin,Total: 0.3 mg/dL (ref 0.2–1.0)
Calcium, Total: 8.5 mg/dL (ref 8.5–10.1)
Chloride: 104 mmol/L (ref 98–107)
Co2: 30 mmol/L (ref 21–32)
EGFR (African American): >60
Osmolality: 282 (ref 275–301)
Potassium: 4 mmol/L (ref 3.5–5.1)
SGPT (ALT): 26 U/L (ref 12–78)
Total Protein: 6.4 g/dL (ref 6.4–8.2)

## 2013-04-22 LAB — PROTIME-INR
INR: 2.4
Prothrombin Time: 25.6 secs — ABNORMAL HIGH (ref 11.5–14.7)

## 2013-04-22 LAB — MAGNESIUM: Magnesium: 1.9 mg/dL

## 2013-04-29 LAB — CBC CANCER CENTER
Basophil %: 0.1 %
HCT: 32.2 % — ABNORMAL LOW (ref 35.0–47.0)
HGB: 11.2 g/dL — ABNORMAL LOW (ref 12.0–16.0)
MCH: 32.5 pg (ref 26.0–34.0)
MCHC: 34.9 g/dL (ref 32.0–36.0)
MCV: 93 fL (ref 80–100)
Monocyte #: 0.2 x10 3/mm (ref 0.2–0.9)
Platelet: 206 x10 3/mm (ref 150–440)
RBC: 3.45 10*6/uL — ABNORMAL LOW (ref 3.80–5.20)
WBC: 5.3 x10 3/mm (ref 3.6–11.0)

## 2013-04-29 LAB — MAGNESIUM: Magnesium: 1.6 mg/dL — ABNORMAL LOW

## 2013-04-29 LAB — POTASSIUM: Potassium: 3.8 mmol/L (ref 3.5–5.1)

## 2013-04-29 LAB — PROTIME-INR
INR: 1.8
Prothrombin Time: 20.8 secs — ABNORMAL HIGH (ref 11.5–14.7)

## 2013-05-06 LAB — CBC CANCER CENTER
Basophil #: 0.1 x10 3/mm (ref 0.0–0.1)
Eosinophil %: 1 %
HCT: 33.4 % — ABNORMAL LOW (ref 35.0–47.0)
HGB: 11.7 g/dL — ABNORMAL LOW (ref 12.0–16.0)
MCH: 33.3 pg (ref 26.0–34.0)
MCHC: 35.1 g/dL (ref 32.0–36.0)
MCV: 95 fL (ref 80–100)
Monocyte #: 1.1 x10 3/mm — ABNORMAL HIGH (ref 0.2–0.9)
Monocyte %: 17.5 %
Neutrophil %: 33.6 %
Platelet: 195 x10 3/mm (ref 150–440)
RBC: 3.51 10*6/uL — ABNORMAL LOW (ref 3.80–5.20)
RDW: 18.1 % — ABNORMAL HIGH (ref 11.5–14.5)
WBC: 6.5 x10 3/mm (ref 3.6–11.0)

## 2013-05-06 LAB — MAGNESIUM: Magnesium: 1.9 mg/dL

## 2013-05-06 LAB — BASIC METABOLIC PANEL
Anion Gap: 6 — ABNORMAL LOW (ref 7–16)
BUN: 9 mg/dL (ref 7–18)
Calcium, Total: 8.4 mg/dL — ABNORMAL LOW (ref 8.5–10.1)
Chloride: 106 mmol/L (ref 98–107)
Co2: 29 mmol/L (ref 21–32)
Creatinine: 0.77 mg/dL (ref 0.60–1.30)
EGFR (African American): >60
EGFR (Non-African Amer.): >60
Potassium: 4.1 mmol/L (ref 3.5–5.1)
Sodium: 141 mmol/L (ref 136–145)

## 2013-05-13 LAB — CBC CANCER CENTER
Basophil #: 0.1 x10 3/mm (ref 0.0–0.1)
Basophil %: 1 %
HGB: 11.8 g/dL — ABNORMAL LOW (ref 12.0–16.0)
Lymphocyte #: 2.9 x10 3/mm (ref 1.0–3.6)
Lymphocyte %: 35.7 %
MCHC: 34.5 g/dL (ref 32.0–36.0)
Monocyte #: 1.5 x10 3/mm — ABNORMAL HIGH (ref 0.2–0.9)
Neutrophil #: 3.6 x10 3/mm (ref 1.4–6.5)
WBC: 8.2 x10 3/mm (ref 3.6–11.0)

## 2013-05-13 LAB — COMPREHENSIVE METABOLIC PANEL
Alkaline Phosphatase: 88 U/L (ref 50–136)
BUN: 11 mg/dL (ref 7–18)
Bilirubin,Total: 0.3 mg/dL (ref 0.2–1.0)
Chloride: 109 mmol/L — ABNORMAL HIGH (ref 98–107)
EGFR (African American): >60
EGFR (Non-African Amer.): >60
Glucose: 100 mg/dL — ABNORMAL HIGH (ref 65–99)
SGOT(AST): 44 U/L — ABNORMAL HIGH (ref 15–37)
SGPT (ALT): 25 U/L (ref 12–78)
Total Protein: 6.2 g/dL — ABNORMAL LOW (ref 6.4–8.2)

## 2013-05-13 LAB — MAGNESIUM: Magnesium: 1.7 mg/dL — ABNORMAL LOW

## 2013-05-16 ENCOUNTER — Emergency Department: Payer: Self-pay | Admitting: Emergency Medicine

## 2013-05-16 LAB — CBC WITH DIFFERENTIAL/PLATELET
Basophil #: 0.1 10*3/uL (ref 0.0–0.1)
Basophil %: 0.1 %
Eosinophil #: 0 10*3/uL (ref 0.0–0.7)
Eosinophil %: 0.1 %
HCT: 32.1 % — ABNORMAL LOW (ref 35.0–47.0)
Lymphocyte #: 2.3 10*3/uL (ref 1.0–3.6)
Lymphocytes: 4 %
MCH: 32.5 pg (ref 26.0–34.0)
MCHC: 34.4 g/dL (ref 32.0–36.0)
MCV: 95 fL (ref 80–100)
Monocyte #: 0.9 x10 3/mm (ref 0.2–0.9)
Monocyte %: 1.9 %
Neutrophil #: 45.9 10*3/uL — ABNORMAL HIGH (ref 1.4–6.5)
Platelet: 203 10*3/uL (ref 150–440)
Platelet: 193 10*3/uL (ref 150–440)
RBC: 3.49 10*6/uL — ABNORMAL LOW (ref 3.80–5.20)
RBC: 3.39 10*6/uL — ABNORMAL LOW (ref 3.80–5.20)
RDW: 18.5 % — ABNORMAL HIGH (ref 11.5–14.5)
RDW: 18.7 % — ABNORMAL HIGH (ref 11.5–14.5)
WBC: 49.3 10*3/uL — ABNORMAL HIGH (ref 3.6–11.0)
WBC: 50.5 10*3/uL — ABNORMAL HIGH (ref 3.6–11.0)

## 2013-05-16 LAB — COMPREHENSIVE METABOLIC PANEL
Albumin: 3.2 g/dL — ABNORMAL LOW (ref 3.4–5.0)
Anion Gap: 6 — ABNORMAL LOW (ref 7–16)
Calcium, Total: 8.2 mg/dL — ABNORMAL LOW (ref 8.5–10.1)
Chloride: 106 mmol/L (ref 98–107)
Co2: 27 mmol/L (ref 21–32)
EGFR (African American): >60
EGFR (Non-African Amer.): >60
Glucose: 127 mg/dL — ABNORMAL HIGH (ref 65–99)
Potassium: 4 mmol/L (ref 3.5–5.1)
SGOT(AST): 22 U/L (ref 15–37)
SGPT (ALT): 20 U/L (ref 12–78)
Total Protein: 6 g/dL — ABNORMAL LOW (ref 6.4–8.2)

## 2013-05-16 LAB — URINALYSIS, COMPLETE
Bilirubin,UR: NEGATIVE
Nitrite: NEGATIVE
Protein: NEGATIVE
RBC,UR: 19 /HPF (ref 0–5)
Specific Gravity: 1.019 (ref 1.003–1.030)
Squamous Epithelial: <1
WBC UR: 2 /HPF (ref 0–5)

## 2013-05-16 LAB — PROTIME-INR: INR: 2.4

## 2013-05-16 LAB — LIPASE, BLOOD: Lipase: 64 U/L — ABNORMAL LOW (ref 73–393)

## 2013-05-16 LAB — APTT: Activated PTT: 31.6 secs (ref 23.6–35.9)

## 2013-05-18 LAB — URINE CULTURE

## 2013-05-20 ENCOUNTER — Ambulatory Visit: Payer: Self-pay | Admitting: Internal Medicine

## 2013-05-21 LAB — CULTURE, BLOOD (SINGLE)

## 2013-05-21 LAB — CBC CANCER CENTER
Basophil #: 0.1 x10 3/mm (ref 0.0–0.1)
Basophil %: 1.3 %
Eosinophil #: 0.1 x10 3/mm (ref 0.0–0.7)
Eosinophil %: 1.6 %
HCT: 30.2 % — ABNORMAL LOW (ref 35.0–47.0)
Lymphocyte #: 1.8 x10 3/mm (ref 1.0–3.6)
MCV: 95 fL (ref 80–100)
Monocyte #: 0.3 x10 3/mm (ref 0.2–0.9)
Monocyte %: 7.7 %
Neutrophil #: 2 x10 3/mm (ref 1.4–6.5)
Neutrophil %: 47.3 %
RDW: 17.6 % — ABNORMAL HIGH (ref 11.5–14.5)

## 2013-05-21 LAB — MAGNESIUM: Magnesium: 1.8 mg/dL

## 2013-05-21 LAB — POTASSIUM: Potassium: 3.7 mmol/L (ref 3.5–5.1)

## 2013-05-27 LAB — CBC CANCER CENTER
Basophil #: 0.1 "x10 3/mm "
Basophil %: 0.7 %
Eosinophil #: 0 "x10 3/mm "
Eosinophil %: 0.6 %
HCT: 31.4 % — ABNORMAL LOW
HGB: 10.8 g/dL — ABNORMAL LOW
Lymphocyte %: 31.9 %
Lymphs Abs: 2.7 "x10 3/mm "
MCH: 33.2 pg
MCHC: 34.3 g/dL
MCV: 97 fL
Monocyte #: 1.5 "x10 3/mm " — ABNORMAL HIGH
Monocyte %: 17.5 %
Neutrophil #: 4.2 "x10 3/mm "
Neutrophil %: 49.3 %
Platelet: 289 "x10 3/mm "
RBC: 3.25 "x10 6/mm " — ABNORMAL LOW
RDW: 18 % — ABNORMAL HIGH
WBC: 8.5 "x10 3/mm "

## 2013-05-27 LAB — PROTIME-INR: Prothrombin Time: 21.9 secs — ABNORMAL HIGH (ref 11.5–14.7)

## 2013-05-27 LAB — URINALYSIS, COMPLETE
Bilirubin,UR: NEGATIVE
Glucose,UR: NEGATIVE mg/dL (ref 0–75)
Ketone: NEGATIVE
Nitrite: NEGATIVE
Ph: 6 (ref 4.5–8.0)
Squamous Epithelial: 8
WBC UR: 11 /HPF (ref 0–5)

## 2013-05-28 LAB — URINE CULTURE

## 2013-06-03 LAB — CBC CANCER CENTER
Basophil %: 1.1 %
Eosinophil #: 0 x10 3/mm (ref 0.0–0.7)
Eosinophil %: 0.3 %
HCT: 33.1 % — ABNORMAL LOW (ref 35.0–47.0)
Lymphocyte #: 3 x10 3/mm (ref 1.0–3.6)
Lymphocyte %: 29.4 %
MCHC: 34.3 g/dL (ref 32.0–36.0)
MCV: 96 fL (ref 80–100)
Monocyte #: 1.7 x10 3/mm — ABNORMAL HIGH (ref 0.2–0.9)
Neutrophil #: 5.3 x10 3/mm (ref 1.4–6.5)
Neutrophil %: 52.7 %
Platelet: 383 x10 3/mm (ref 150–440)
RBC: 3.44 10*6/uL — ABNORMAL LOW (ref 3.80–5.20)

## 2013-06-10 LAB — MAGNESIUM: Magnesium: 1.8 mg/dL

## 2013-06-10 LAB — CBC CANCER CENTER
Basophil %: 0.9 %
Eosinophil #: 0 x10 3/mm (ref 0.0–0.7)
HGB: 10 g/dL — ABNORMAL LOW (ref 12.0–16.0)
Lymphocyte #: 2.2 x10 3/mm (ref 1.0–3.6)
Lymphocyte %: 49.9 %
MCHC: 34.1 g/dL (ref 32.0–36.0)
MCV: 96 fL (ref 80–100)
Monocyte #: 0.2 x10 3/mm (ref 0.2–0.9)
Monocyte %: 4.1 %
Neutrophil #: 1.9 x10 3/mm (ref 1.4–6.5)
RBC: 3.05 10*6/uL — ABNORMAL LOW (ref 3.80–5.20)
WBC: 4.3 x10 3/mm (ref 3.6–11.0)

## 2013-06-10 LAB — BASIC METABOLIC PANEL
Anion Gap: 8 (ref 7–16)
BUN: 8 mg/dL (ref 7–18)
Chloride: 106 mmol/L (ref 98–107)
Co2: 27 mmol/L (ref 21–32)
EGFR (Non-African Amer.): >60
Glucose: 96 mg/dL (ref 65–99)
Osmolality: 279 (ref 275–301)
Potassium: 4 mmol/L (ref 3.5–5.1)
Sodium: 141 mmol/L (ref 136–145)

## 2013-06-17 LAB — CBC CANCER CENTER
Eosinophil #: 0 x10 3/mm (ref 0.0–0.7)
HCT: 31.8 % — ABNORMAL LOW (ref 35.0–47.0)
Lymphocyte #: 3.2 x10 3/mm (ref 1.0–3.6)
MCH: 32.8 pg (ref 26.0–34.0)
MCHC: 33.7 g/dL (ref 32.0–36.0)
Neutrophil #: 1.9 x10 3/mm (ref 1.4–6.5)
Neutrophil %: 28.6 %
Platelet: 258 x10 3/mm (ref 150–440)
RBC: 3.28 10*6/uL — ABNORMAL LOW (ref 3.80–5.20)
RDW: 17.1 % — ABNORMAL HIGH (ref 11.5–14.5)
WBC: 6.6 x10 3/mm (ref 3.6–11.0)

## 2013-06-19 ENCOUNTER — Ambulatory Visit: Payer: Self-pay | Admitting: Internal Medicine

## 2013-06-24 ENCOUNTER — Ambulatory Visit: Payer: Self-pay | Admitting: Internal Medicine

## 2013-06-24 LAB — COMPREHENSIVE METABOLIC PANEL
Albumin: 3.1 g/dL — ABNORMAL LOW (ref 3.4–5.0)
Alkaline Phosphatase: 101 U/L (ref 50–136)
BUN: 10 mg/dL (ref 7–18)
Bilirubin,Total: 0.2 mg/dL (ref 0.2–1.0)
Chloride: 106 mmol/L (ref 98–107)
Co2: 27 mmol/L (ref 21–32)
EGFR (Non-African Amer.): >60
SGOT(AST): 26 U/L (ref 15–37)
SGPT (ALT): 22 U/L (ref 12–78)
Total Protein: 6.6 g/dL (ref 6.4–8.2)

## 2013-06-24 LAB — CBC CANCER CENTER
Eosinophil %: 0.4 %
HCT: 33.6 % — ABNORMAL LOW (ref 35.0–47.0)
HGB: 11.3 g/dL — ABNORMAL LOW (ref 12.0–16.0)
Lymphocyte #: 2.6 x10 3/mm (ref 1.0–3.6)
Lymphocyte %: 36.7 %
MCH: 33.4 pg (ref 26.0–34.0)
MCV: 99 fL (ref 80–100)
Monocyte #: 1.3 x10 3/mm — ABNORMAL HIGH (ref 0.2–0.9)
Monocyte %: 18.3 %
Neutrophil #: 3.1 x10 3/mm (ref 1.4–6.5)
Neutrophil %: 43.2 %
Platelet: 292 x10 3/mm (ref 150–440)
RBC: 3.39 10*6/uL — ABNORMAL LOW (ref 3.80–5.20)
WBC: 7.1 x10 3/mm (ref 3.6–11.0)

## 2013-07-17 ENCOUNTER — Ambulatory Visit: Payer: Self-pay | Admitting: Internal Medicine

## 2013-07-20 ENCOUNTER — Ambulatory Visit: Payer: Self-pay | Admitting: Internal Medicine

## 2013-07-22 LAB — PROTIME-INR
INR: 1.8
Prothrombin Time: 20.3 secs — ABNORMAL HIGH (ref 11.5–14.7)

## 2013-08-12 LAB — PROTIME-INR: Prothrombin Time: 23.8 secs — ABNORMAL HIGH (ref 11.5–14.7)

## 2013-08-19 ENCOUNTER — Ambulatory Visit: Payer: Self-pay | Admitting: Internal Medicine

## 2013-08-26 LAB — CBC CANCER CENTER
Eosinophil %: 1.7 %
HCT: 38.8 % (ref 35.0–47.0)
HGB: 12.9 g/dL (ref 12.0–16.0)
Lymphocyte %: 57.3 %
MCH: 31.5 pg (ref 26.0–34.0)
MCHC: 33.3 g/dL (ref 32.0–36.0)
MCV: 94 fL (ref 80–100)
Monocyte #: 0.8 x10 3/mm (ref 0.2–0.9)
Monocyte %: 12.1 %
Neutrophil #: 1.9 x10 3/mm (ref 1.4–6.5)
Neutrophil %: 28.3 %
RDW: 14.6 % — ABNORMAL HIGH (ref 11.5–14.5)
WBC: 6.6 x10 3/mm (ref 3.6–11.0)

## 2013-08-26 LAB — LACTATE DEHYDROGENASE: LDH: 226 U/L (ref 81–246)

## 2013-09-19 ENCOUNTER — Ambulatory Visit: Payer: Self-pay | Admitting: Internal Medicine

## 2013-09-30 LAB — PROTIME-INR
INR: 2.5
PROTHROMBIN TIME: 26.7 s — AB (ref 11.5–14.7)

## 2013-10-20 ENCOUNTER — Ambulatory Visit: Payer: Self-pay | Admitting: Internal Medicine

## 2013-11-04 LAB — PROTIME-INR
INR: 2.9
PROTHROMBIN TIME: 29.2 s — AB (ref 11.5–14.7)

## 2013-11-17 ENCOUNTER — Ambulatory Visit: Payer: Self-pay | Admitting: Internal Medicine

## 2013-11-18 LAB — PROTIME-INR
INR: 2.7
Prothrombin Time: 28.1 secs — ABNORMAL HIGH (ref 11.5–14.7)

## 2013-12-09 LAB — PROTIME-INR
INR: 2.5
PROTHROMBIN TIME: 26.5 s — AB (ref 11.5–14.7)

## 2013-12-18 ENCOUNTER — Ambulatory Visit: Payer: Self-pay | Admitting: Internal Medicine

## 2013-12-24 LAB — BASIC METABOLIC PANEL
Anion Gap: 7 (ref 7–16)
BUN: 8 mg/dL (ref 7–18)
CHLORIDE: 98 mmol/L (ref 98–107)
Calcium, Total: 8.7 mg/dL (ref 8.5–10.1)
Co2: 31 mmol/L (ref 21–32)
Creatinine: 0.86 mg/dL (ref 0.60–1.30)
EGFR (African American): >60
Glucose: 90 mg/dL (ref 65–99)
Osmolality: 270 (ref 275–301)
Potassium: 4.4 mmol/L (ref 3.5–5.1)
Sodium: 136 mmol/L (ref 136–145)

## 2013-12-24 LAB — PROTIME-INR
INR: 2.4
PROTHROMBIN TIME: 25.2 s — AB (ref 11.5–14.7)

## 2013-12-24 LAB — MAGNESIUM: MAGNESIUM: 1.9 mg/dL

## 2014-01-13 LAB — CBC CANCER CENTER
BASOS ABS: 0 x10 3/mm (ref 0.0–0.1)
BASOS PCT: 0.6 %
Eosinophil #: 0.1 x10 3/mm (ref 0.0–0.7)
Eosinophil %: 1.9 %
HCT: 38.8 % (ref 35.0–47.0)
HGB: 12.9 g/dL (ref 12.0–16.0)
Lymphocyte #: 3.2 x10 3/mm (ref 1.0–3.6)
Lymphocyte %: 52.6 %
MCH: 31.4 pg (ref 26.0–34.0)
MCHC: 33.3 g/dL (ref 32.0–36.0)
MCV: 94 fL (ref 80–100)
MONOS PCT: 14.1 %
Monocyte #: 0.9 x10 3/mm (ref 0.2–0.9)
NEUTROS ABS: 1.9 x10 3/mm (ref 1.4–6.5)
Neutrophil %: 30.8 %
Platelet: 254 x10 3/mm (ref 150–440)
RBC: 4.11 10*6/uL (ref 3.80–5.20)
RDW: 15.7 % — ABNORMAL HIGH (ref 11.5–14.5)
WBC: 6.2 x10 3/mm (ref 3.6–11.0)

## 2014-01-13 LAB — POTASSIUM: Potassium: 4.2 mmol/L (ref 3.5–5.1)

## 2014-01-13 LAB — PROTIME-INR
INR: 2.7
PROTHROMBIN TIME: 27.8 s — AB (ref 11.5–14.7)

## 2014-01-13 LAB — LACTATE DEHYDROGENASE: LDH: 230 U/L (ref 81–246)

## 2014-01-17 ENCOUNTER — Ambulatory Visit: Payer: Self-pay | Admitting: Internal Medicine

## 2014-02-17 ENCOUNTER — Ambulatory Visit: Payer: Self-pay | Admitting: Internal Medicine

## 2014-02-17 DIAGNOSIS — I480 Paroxysmal atrial fibrillation: Secondary | ICD-10-CM | POA: Insufficient documentation

## 2014-02-17 DIAGNOSIS — I4891 Unspecified atrial fibrillation: Secondary | ICD-10-CM | POA: Insufficient documentation

## 2014-02-17 LAB — CBC CANCER CENTER
BASOS ABS: 0 x10 3/mm (ref 0.0–0.1)
Basophil %: 0.7 %
EOS ABS: 0.1 x10 3/mm (ref 0.0–0.7)
EOS PCT: 1.5 %
HCT: 40.2 % (ref 35.0–47.0)
HGB: 13.4 g/dL (ref 12.0–16.0)
Lymphocyte #: 3.4 x10 3/mm (ref 1.0–3.6)
Lymphocyte %: 53.2 %
MCH: 31.9 pg (ref 26.0–34.0)
MCHC: 33.4 g/dL (ref 32.0–36.0)
MCV: 95 fL (ref 80–100)
MONOS PCT: 9.5 %
Monocyte #: 0.6 x10 3/mm (ref 0.2–0.9)
NEUTROS ABS: 2.3 x10 3/mm (ref 1.4–6.5)
NEUTROS PCT: 35.1 %
PLATELETS: 214 x10 3/mm (ref 150–440)
RBC: 4.22 10*6/uL (ref 3.80–5.20)
RDW: 14.8 % — ABNORMAL HIGH (ref 11.5–14.5)
WBC: 6.5 x10 3/mm (ref 3.6–11.0)

## 2014-02-17 LAB — PROTIME-INR
INR: 2.5
PROTHROMBIN TIME: 26.1 s — AB (ref 11.5–14.7)

## 2014-02-17 LAB — LACTATE DEHYDROGENASE: LDH: 244 U/L (ref 81–246)

## 2014-02-24 DIAGNOSIS — E538 Deficiency of other specified B group vitamins: Secondary | ICD-10-CM | POA: Insufficient documentation

## 2014-03-19 ENCOUNTER — Ambulatory Visit: Payer: Self-pay | Admitting: Internal Medicine

## 2014-03-24 LAB — PROTIME-INR
INR: 1.7
Prothrombin Time: 19.8 secs — ABNORMAL HIGH (ref 11.5–14.7)

## 2014-04-15 DIAGNOSIS — I10 Essential (primary) hypertension: Secondary | ICD-10-CM | POA: Insufficient documentation

## 2014-04-19 ENCOUNTER — Ambulatory Visit: Payer: Self-pay | Admitting: Internal Medicine

## 2014-04-30 LAB — CBC CANCER CENTER
BASOS ABS: 0 x10 3/mm (ref 0.0–0.1)
BASOS PCT: 0.7 %
Eosinophil #: 0.1 x10 3/mm (ref 0.0–0.7)
Eosinophil %: 0.9 %
HCT: 41.9 % (ref 35.0–47.0)
HGB: 13.8 g/dL (ref 12.0–16.0)
Lymphocyte #: 3.8 x10 3/mm — ABNORMAL HIGH (ref 1.0–3.6)
Lymphocyte %: 54.4 %
MCH: 31.1 pg (ref 26.0–34.0)
MCHC: 32.8 g/dL (ref 32.0–36.0)
MCV: 95 fL (ref 80–100)
Monocyte #: 0.8 x10 3/mm (ref 0.2–0.9)
Monocyte %: 11.8 %
NEUTROS PCT: 32.2 %
Neutrophil #: 2.2 x10 3/mm (ref 1.4–6.5)
Platelet: 226 x10 3/mm (ref 150–440)
RBC: 4.43 10*6/uL (ref 3.80–5.20)
RDW: 14.8 % — ABNORMAL HIGH (ref 11.5–14.5)
WBC: 7 x10 3/mm (ref 3.6–11.0)

## 2014-04-30 LAB — PROTIME-INR
INR: 1.8
Prothrombin Time: 20.6 secs — ABNORMAL HIGH (ref 11.5–14.7)

## 2014-04-30 LAB — LACTATE DEHYDROGENASE: LDH: 205 U/L (ref 81–246)

## 2014-05-20 ENCOUNTER — Ambulatory Visit: Payer: Self-pay | Admitting: Internal Medicine

## 2014-06-07 IMAGING — CT CT CHEST W/ CM
1 series · 15 of 32 positions shown, 19 images · non-contrast
Comparison: none

REASON FOR EXAM: pulmonary nodule  breast CA
COMMENTS:

[Series 2: soft tissue · axial · 0.67mm/px · z∈[-246,+24]mm · 15 of 101 slices shown, 19 images]
[im 7/101  soft-tissue]
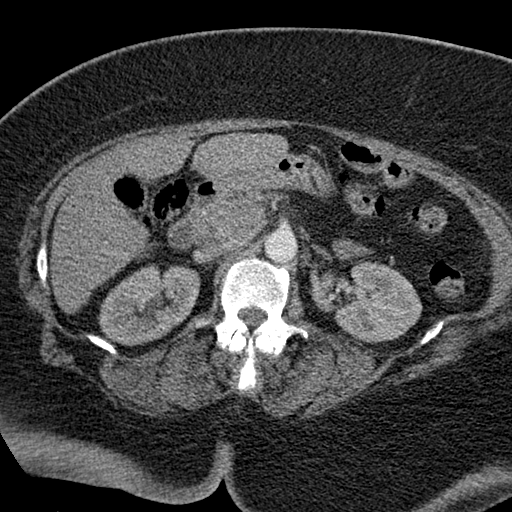
[im 7/101  bone]
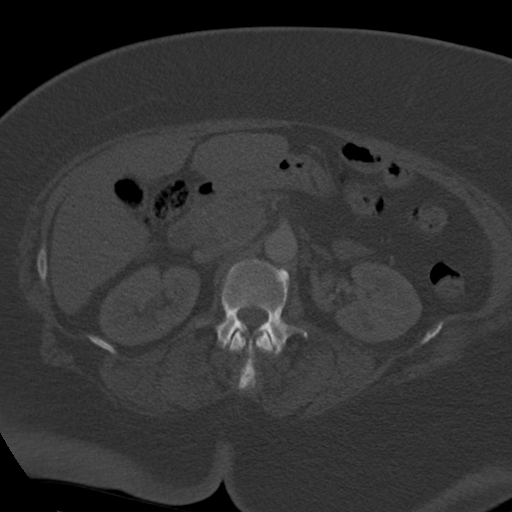
[im 13/101  soft-tissue]
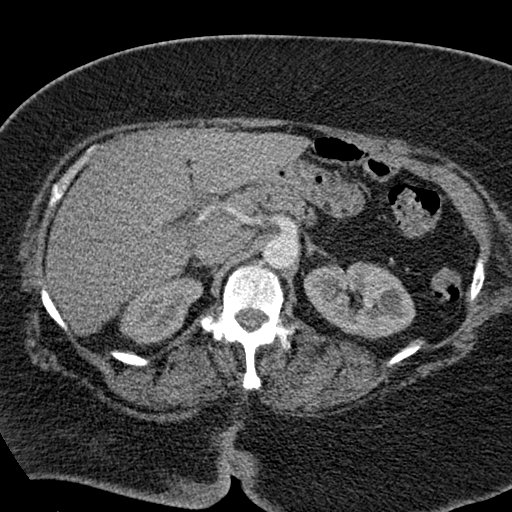
[im 20/101  soft-tissue]
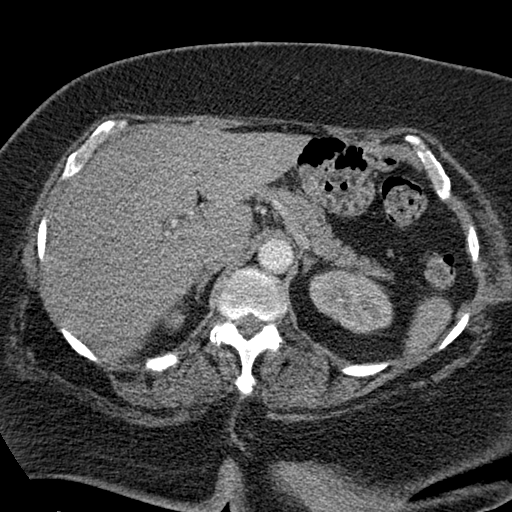
[im 30/101  soft-tissue]
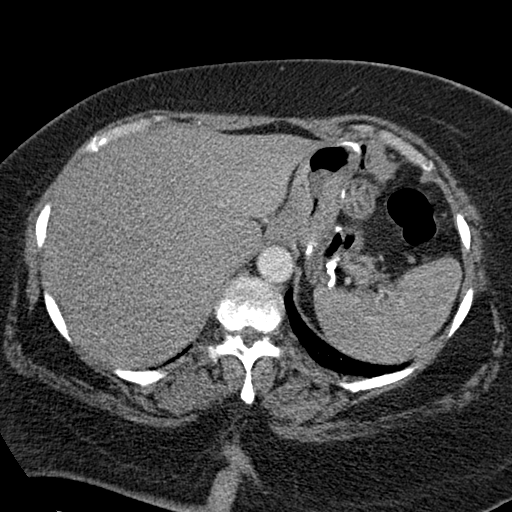
[im 36/101  soft-tissue]
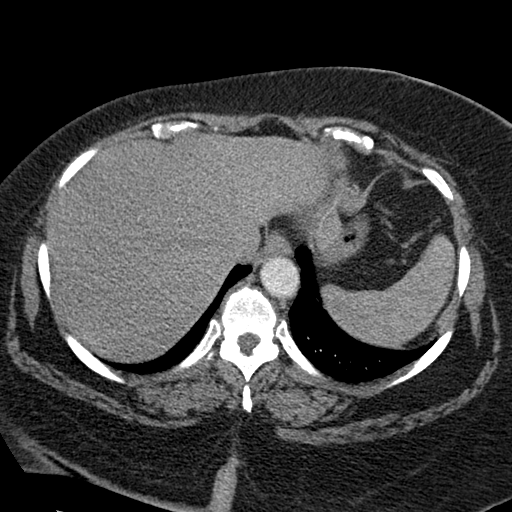
[im 42/101  soft-tissue]
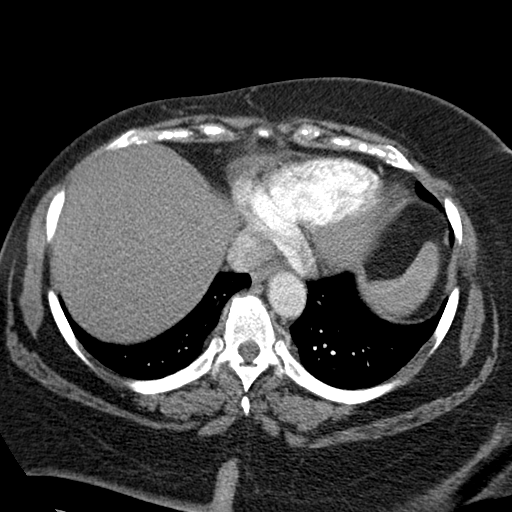
[im 52/101  soft-tissue]
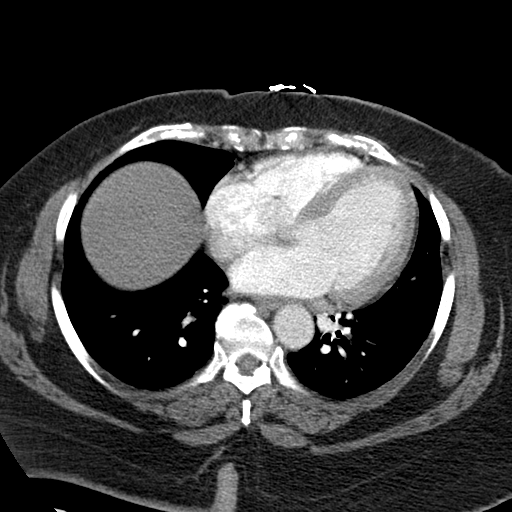
[im 59/101  soft-tissue]
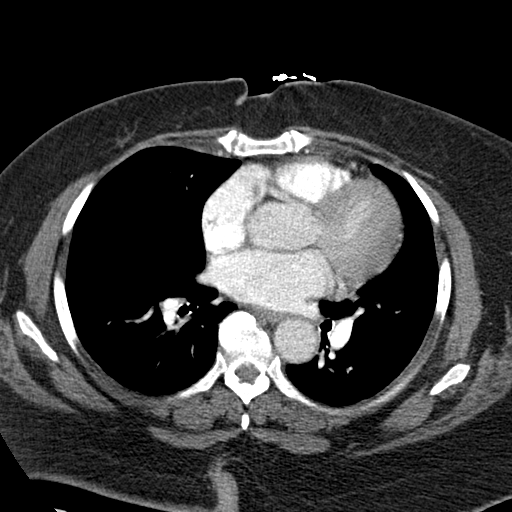
[im 65/101  soft-tissue]
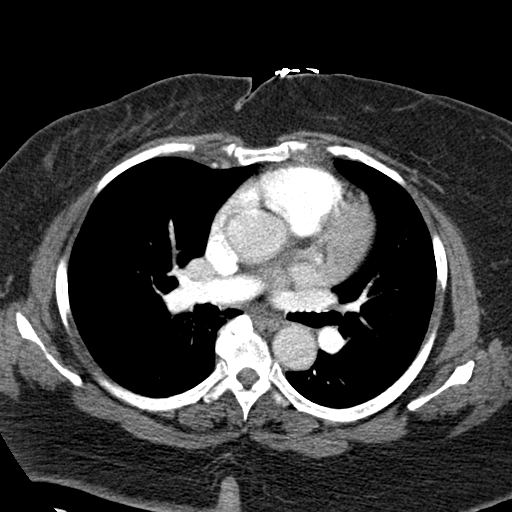
[im 65/101  bone]
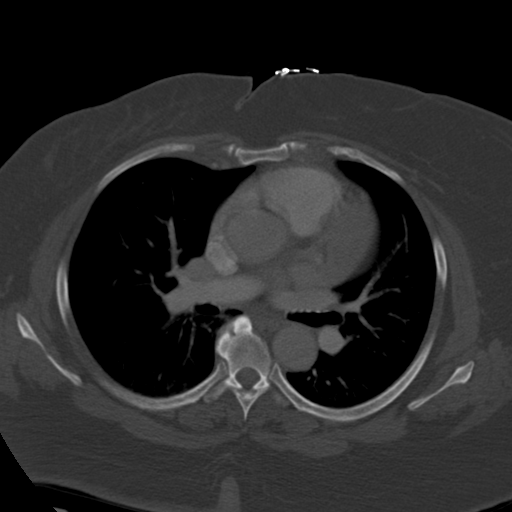
[im 71/101  soft-tissue]
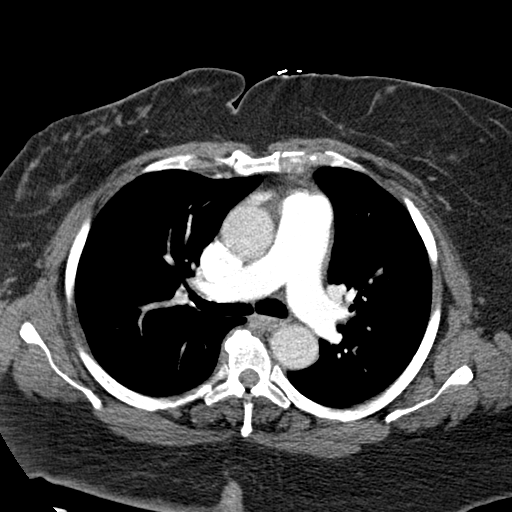
[im 81/101  soft-tissue]
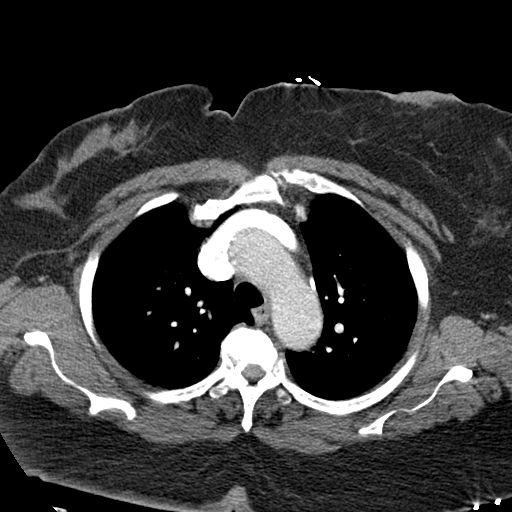
[im 88/101  soft-tissue]
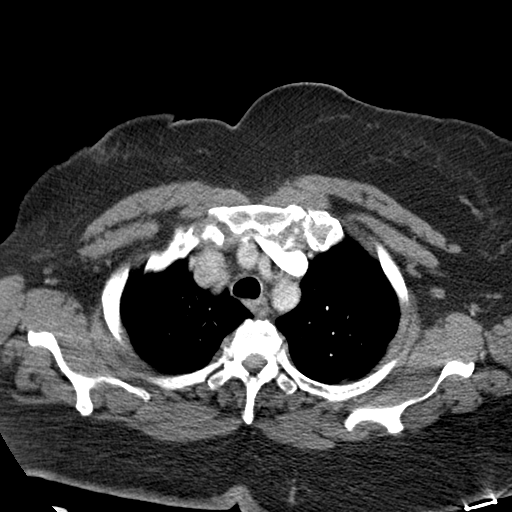
[im 88/101  lung]
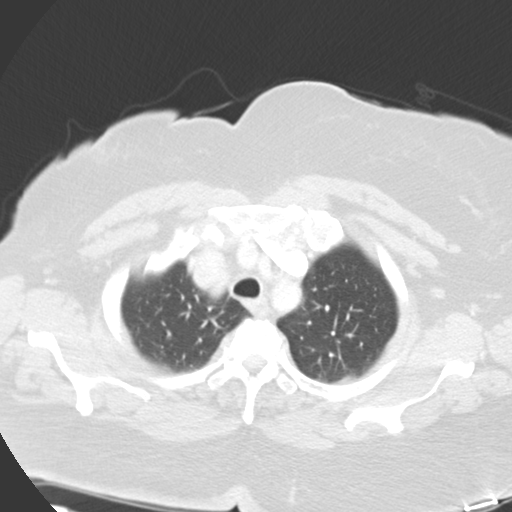
[im 91/101  lung]
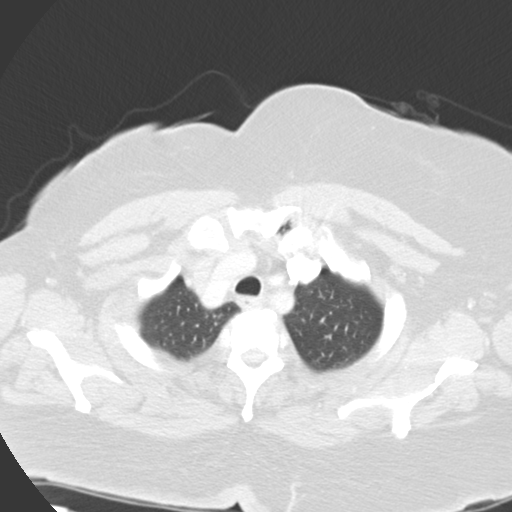
[im 94/101  soft-tissue]
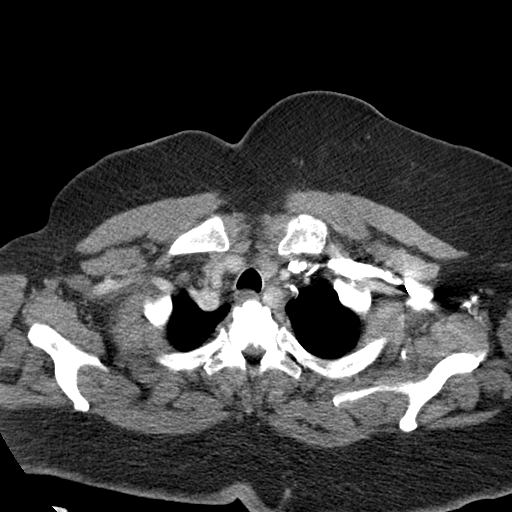
[im 94/101  lung]
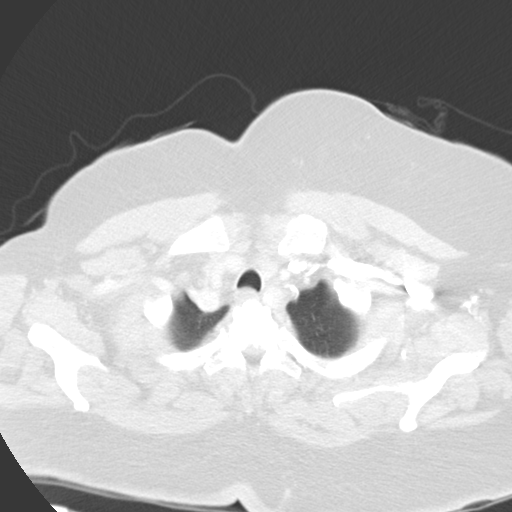
[im 97/101  lung]
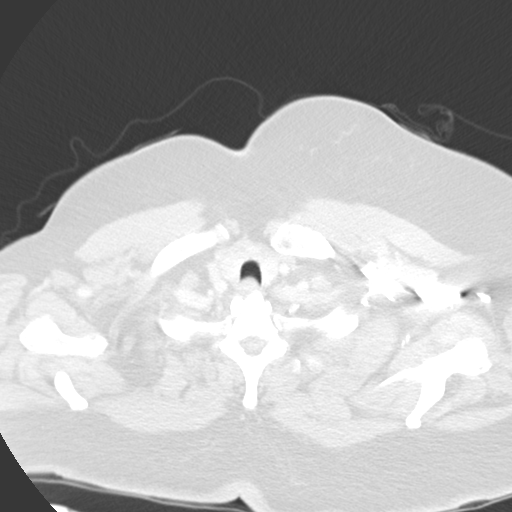

[15 of 32 positions shown; findings below may reference images not displayed]

PROCEDURE:     CT  - CT CHEST WITH CONTRAST  - November 15, 2012  [DATE]

RESULT:     Axial CT scanning was performed through the chest with
reconstructions at 3 mm intervals and slice thicknesses following
intravenous administration of 75 cc of Isovue-ZSK. Review of multiplanar
reconstructed images was performed separately on the VIA monitor. Comparison
is made to a chest CT scan of June 12, 2012.

At lung window settings there is a stable approximately 2 mm diameter nodule
in the left upper lobe on image 16. A 1 mm diameter calcified nodule on the
anterior lateral aspect of the minor fissure is demonstrated on image 33 it
appears stable. I do not see abnormal nodules elsewhere. There is minimal
subsegmental atelectasis in the posterior aspect of both lower lobes.

The cardiac chambers are enlarged. The caliber of the thoracic aorta is
normal. Contrast within the pulmonary arterial tree is normal in appearance.
No pathologic sized mediastinal or hilar lymph nodes are evident. There is
no pleural nor pericardial effusion.

Within the upper abdomen the observed portions of the liver and spleen and
adrenal glands appear normal.
IMPRESSION: 1. There are stable tiny nodules in the left upper lobe and along the minor
fissure on the right. The minor fissure nodule is calcified. I do not see
new nodules. Followup chest CT scanning in 4 months would bring the patient
to one year out from original discovery of these nodules.
2. There is no mediastinal or hilar lymphadenopathy. There is no pleural nor
pericardial effusion.

If there are strong clinical concerns of metastatic breast malignancy,
PET/CT scanning may be the most useful next diagnostic step.

[REDACTED]

## 2014-06-11 LAB — CBC CANCER CENTER
Basophil #: 0 x10 3/mm (ref 0.0–0.1)
Basophil %: 0.5 %
EOS ABS: 0.1 x10 3/mm (ref 0.0–0.7)
EOS PCT: 1.4 %
HCT: 40.7 % (ref 35.0–47.0)
HGB: 13.8 g/dL (ref 12.0–16.0)
LYMPHS ABS: 3.8 x10 3/mm — AB (ref 1.0–3.6)
Lymphocyte %: 51.6 %
MCH: 31.9 pg (ref 26.0–34.0)
MCHC: 33.9 g/dL (ref 32.0–36.0)
MCV: 94 fL (ref 80–100)
MONO ABS: 1 x10 3/mm — AB (ref 0.2–0.9)
Monocyte %: 13.1 %
NEUTROS ABS: 2.5 x10 3/mm (ref 1.4–6.5)
NEUTROS PCT: 33.4 %
Platelet: 248 x10 3/mm (ref 150–440)
RBC: 4.32 10*6/uL (ref 3.80–5.20)
RDW: 14.8 % — AB (ref 11.5–14.5)
WBC: 7.4 x10 3/mm (ref 3.6–11.0)

## 2014-06-11 LAB — PROTIME-INR
INR: 2.3
Prothrombin Time: 25.1 secs — ABNORMAL HIGH (ref 11.5–14.7)

## 2014-06-14 IMAGING — US ULTRASOUND LEFT BREAST
1 series · 14 of 17 positions shown · non-contrast
Comparison: 02/02/2012, 01/05/2011, 12/30/2009, 12/10/2007.

REASON FOR EXAM: av lt small asymmetry
COMMENTS:

PROCEDURE:     US  - US BREAST LEFT  - November 22, 2012 [DATE]
RESULT:

[Series 1: ultrasound left breast · 0.12mm/px · 14 of 17 slices shown]
[im 1/17]
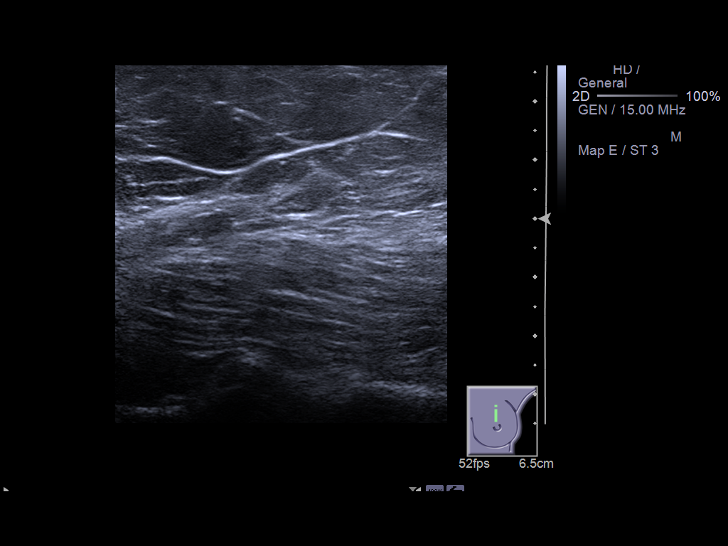
[im 2/17]
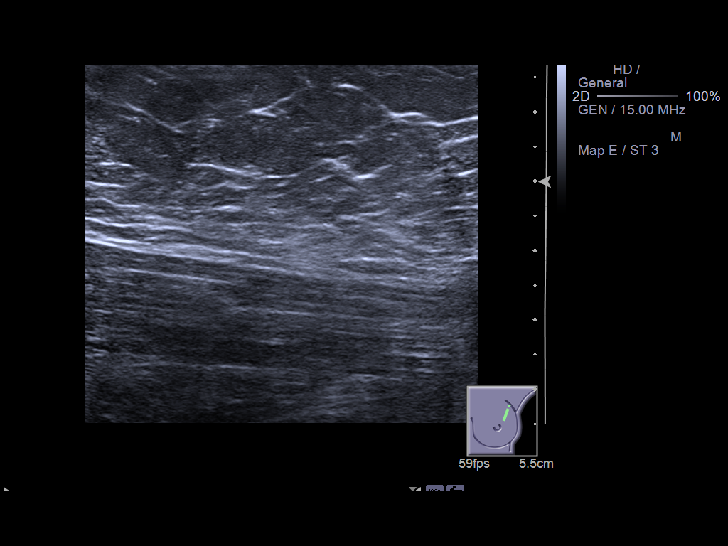
[im 4/17]
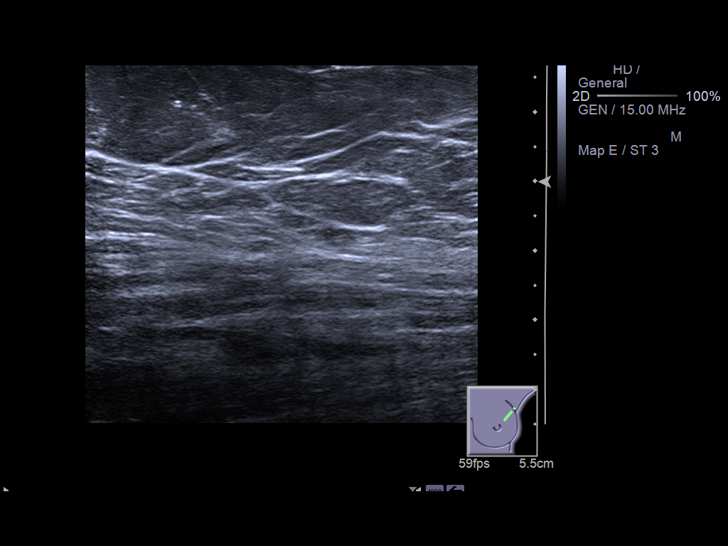
[im 5/17]
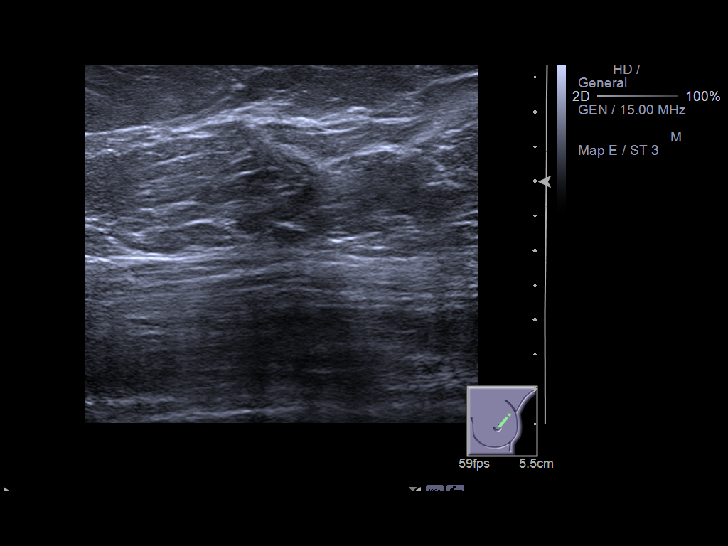
[im 6/17]
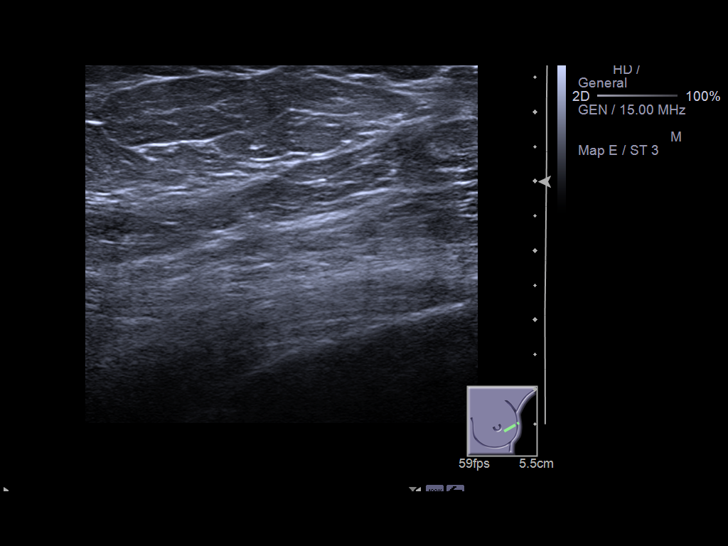
[im 7/17]
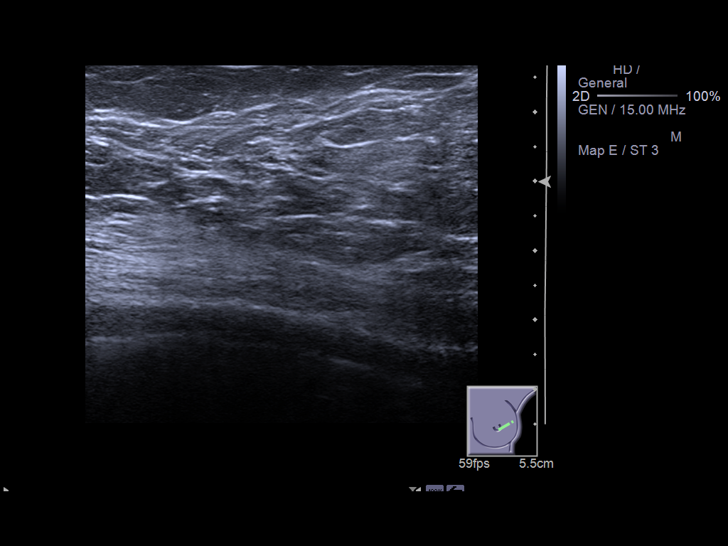
[im 8/17]
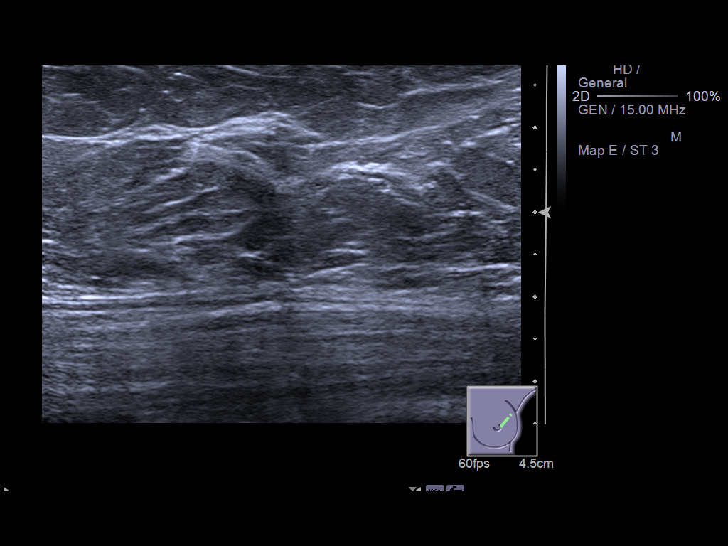
[im 10/17]
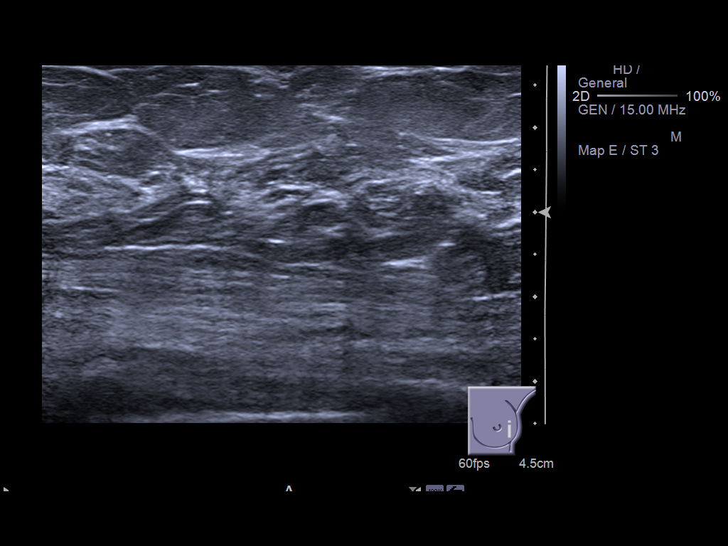
[im 11/17]
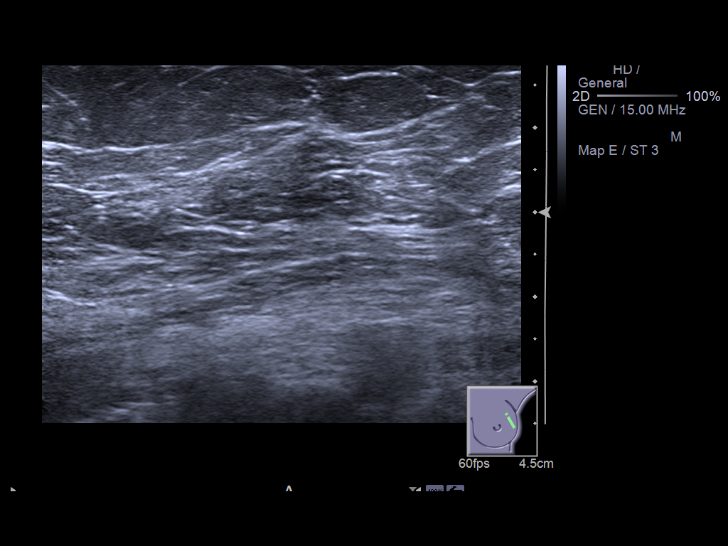
[im 12/17]
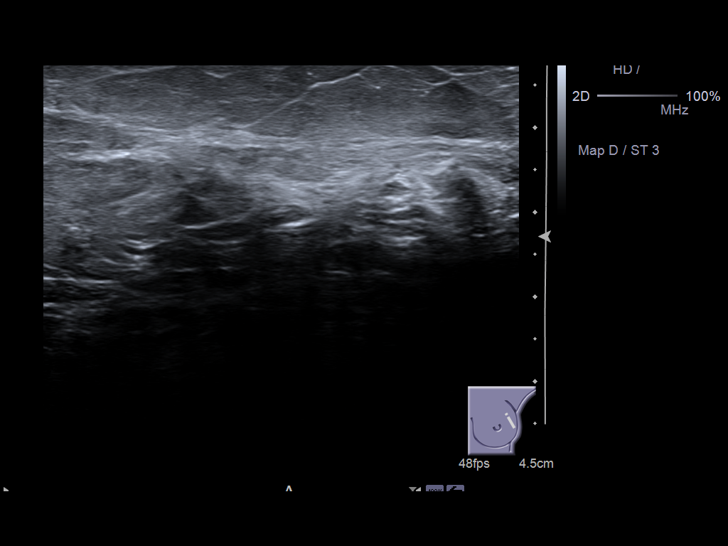
[im 13/17]
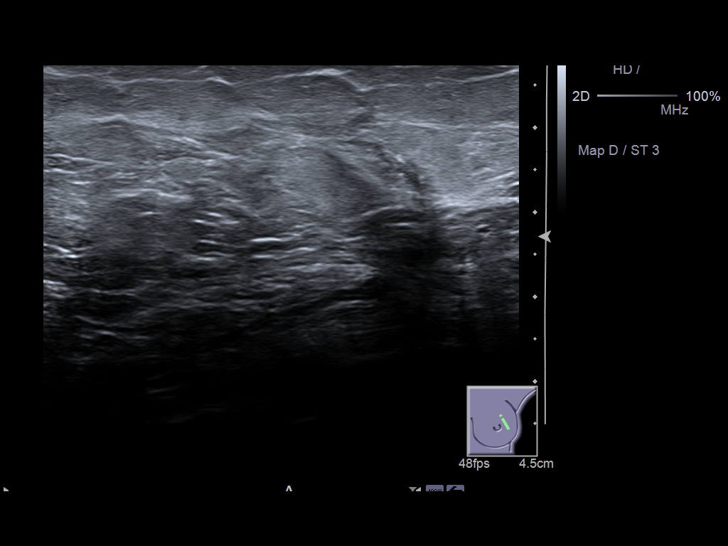
[im 14/17]
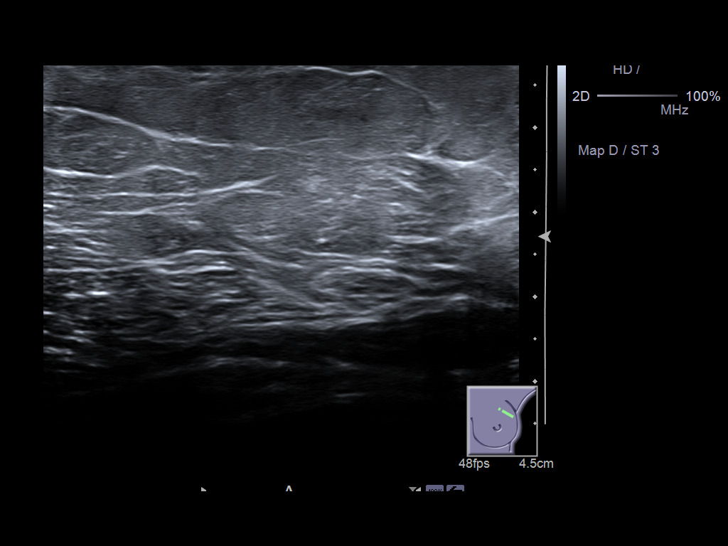
[im 16/17]
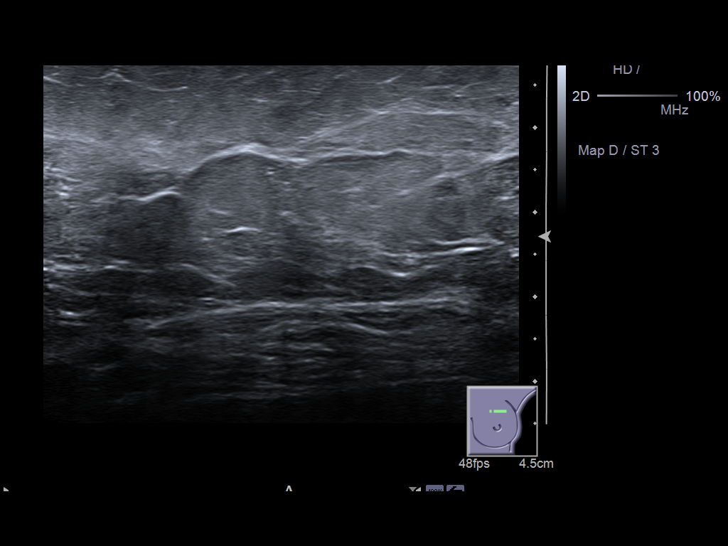
[im 17/17]
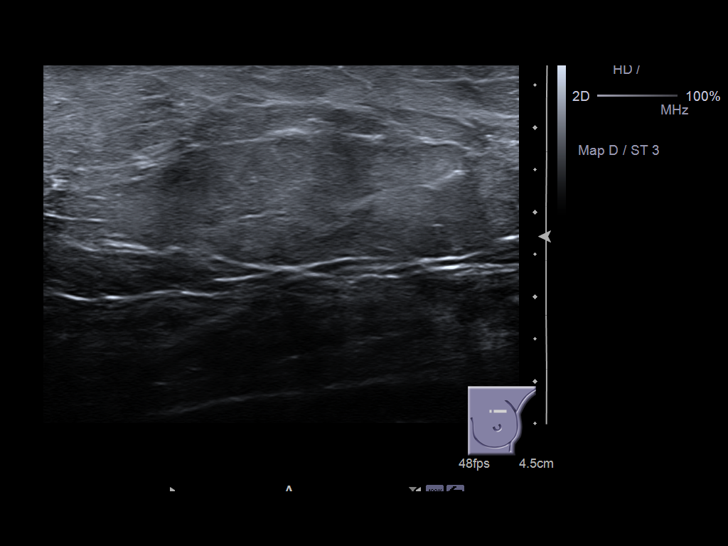

[14 of 17 positions shown; findings below may reference images not displayed]

FINDINGS: Spot compression magnification views are performed to evaluate the small
asymmetry in the lateral aspect of the left CC view. After the initial spot
compression magnification views were performed, the patient was called back
for additional focal spot compression magnification views of this region to
ensure that this area effaced. With these images, the area of the asymmetry
effaced and assumed the appearance of normal fibroglandular tissue that was
similar to prior studies, including 12/30/2009.

Real-time ultrasound was originally performed of the superolateral left
breast. No mass or suspicious shadowing was identified. The patient was
called back and the inferolateral left breast was also scanned. Again, no
mass or suspicious shadowing was identified.
IMPRESSION: 1.     BI-RADS: Category 1 Negative.
2.     Recommend continued annual mammography.

BREAST COMPOSITION: The breast composition is SCATTERED FIBROGLANDULAR
TISSUE (glandular tissue is 25-50%).

## 2014-06-18 IMAGING — MG MM ADDITIONAL VIEWS AT NO CHARGE
1 series · 4 of 4 positions shown · non-contrast
Comparison: 02/02/2012, 01/05/2011, 12/30/2009, 12/10/2007.

REASON FOR EXAM: av lt small asymmetry
COMMENTS:

PROCEDURE:     MAM - MAM DGTL [HOSPITAL] ADD VIEWS LT DIAG  - November 22, 2012 [DATE]
RESULT:

[L CC · left · 4 of 4 slices shown]
[im 1/4]
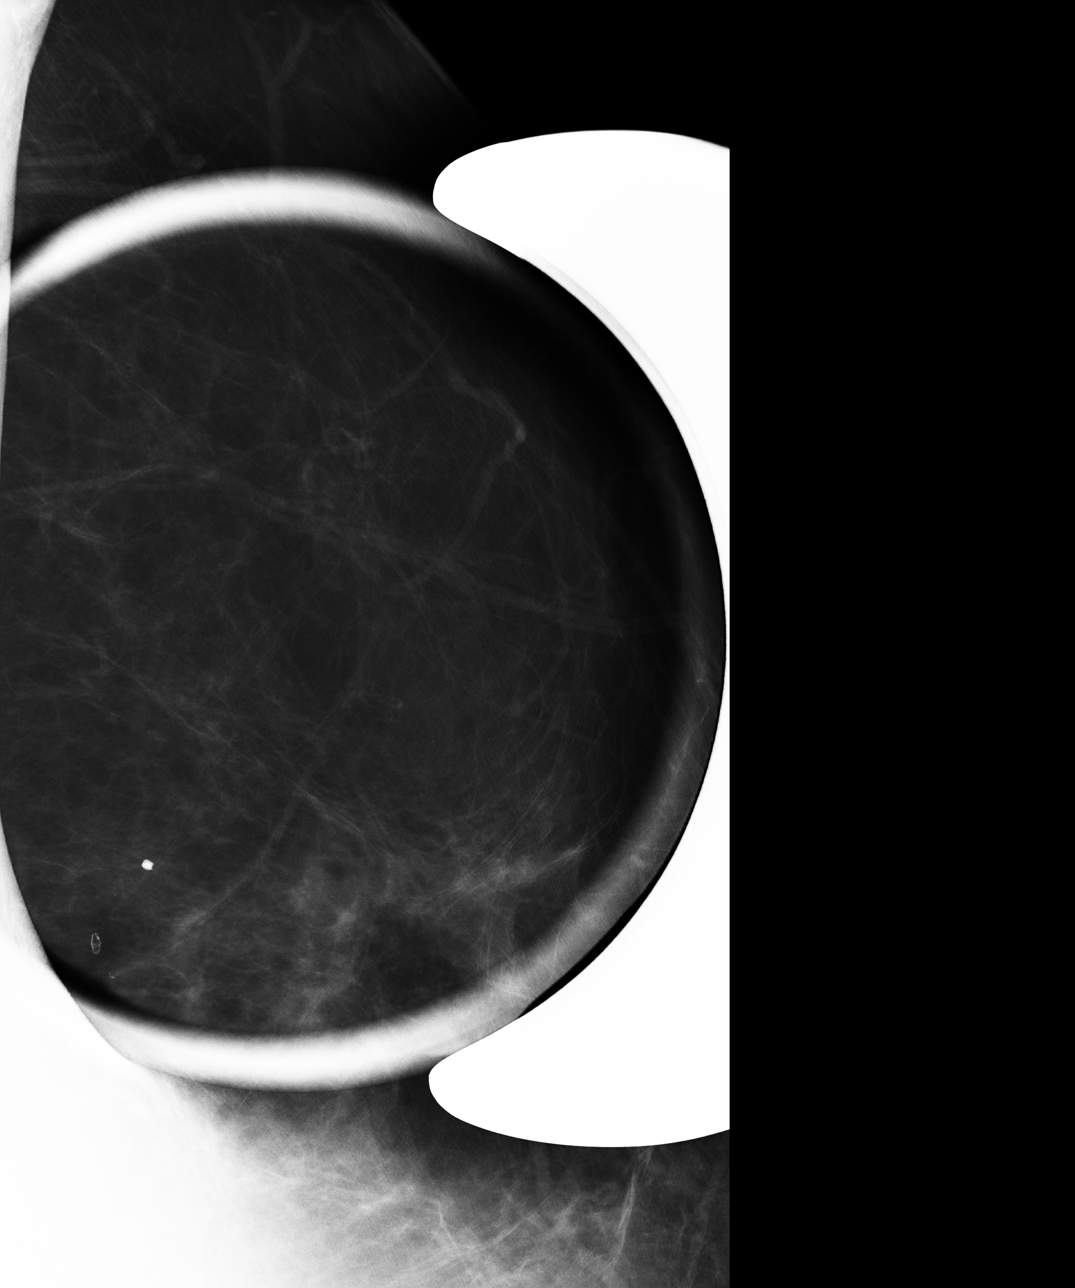
[im 2/4]
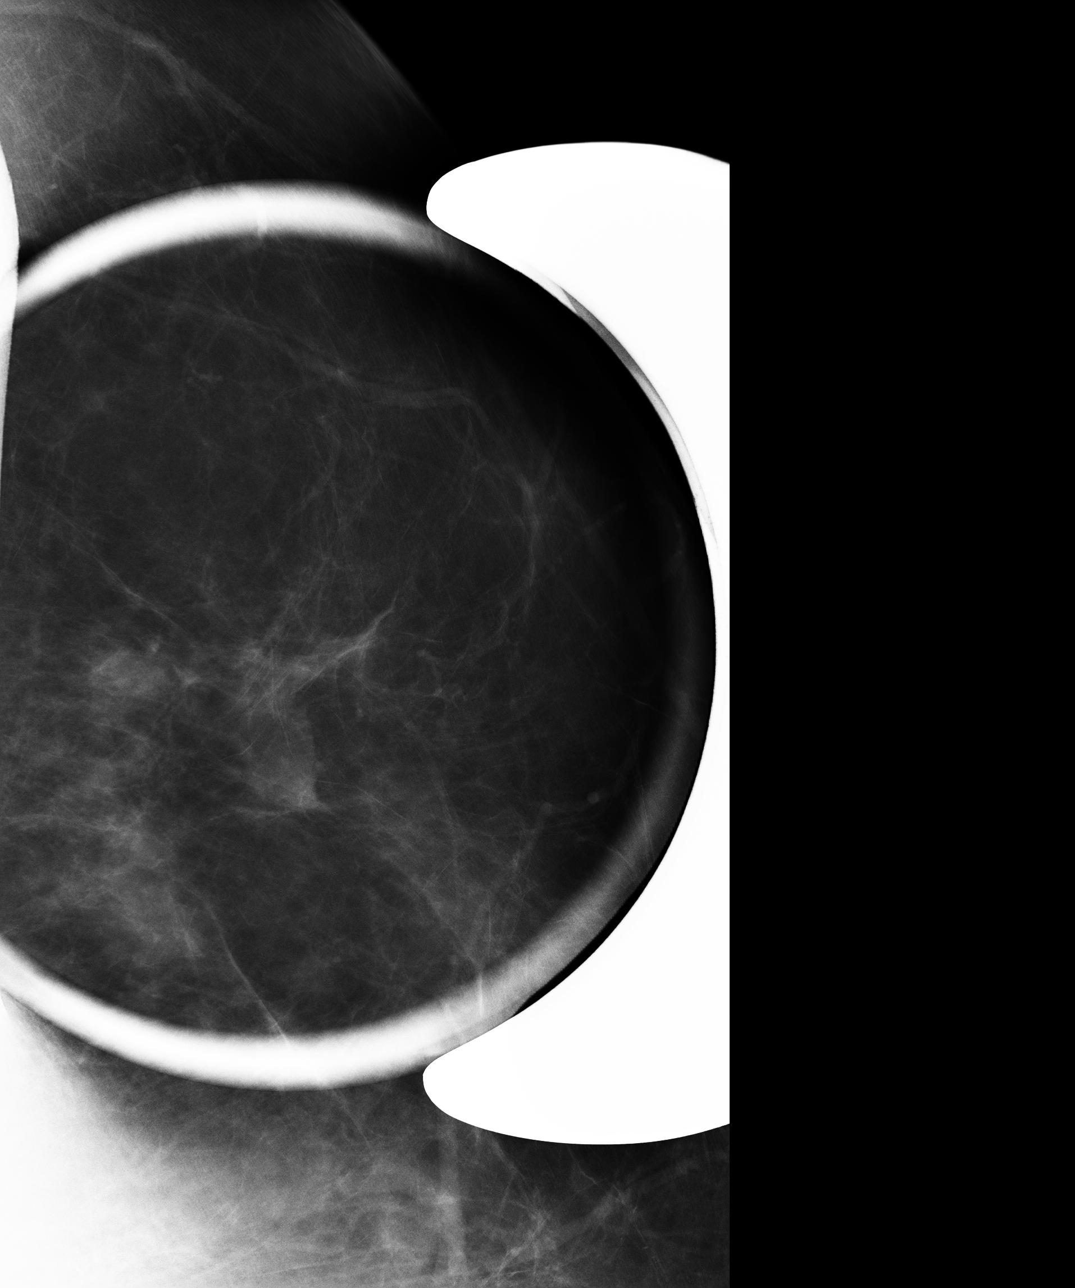
[im 3/4]
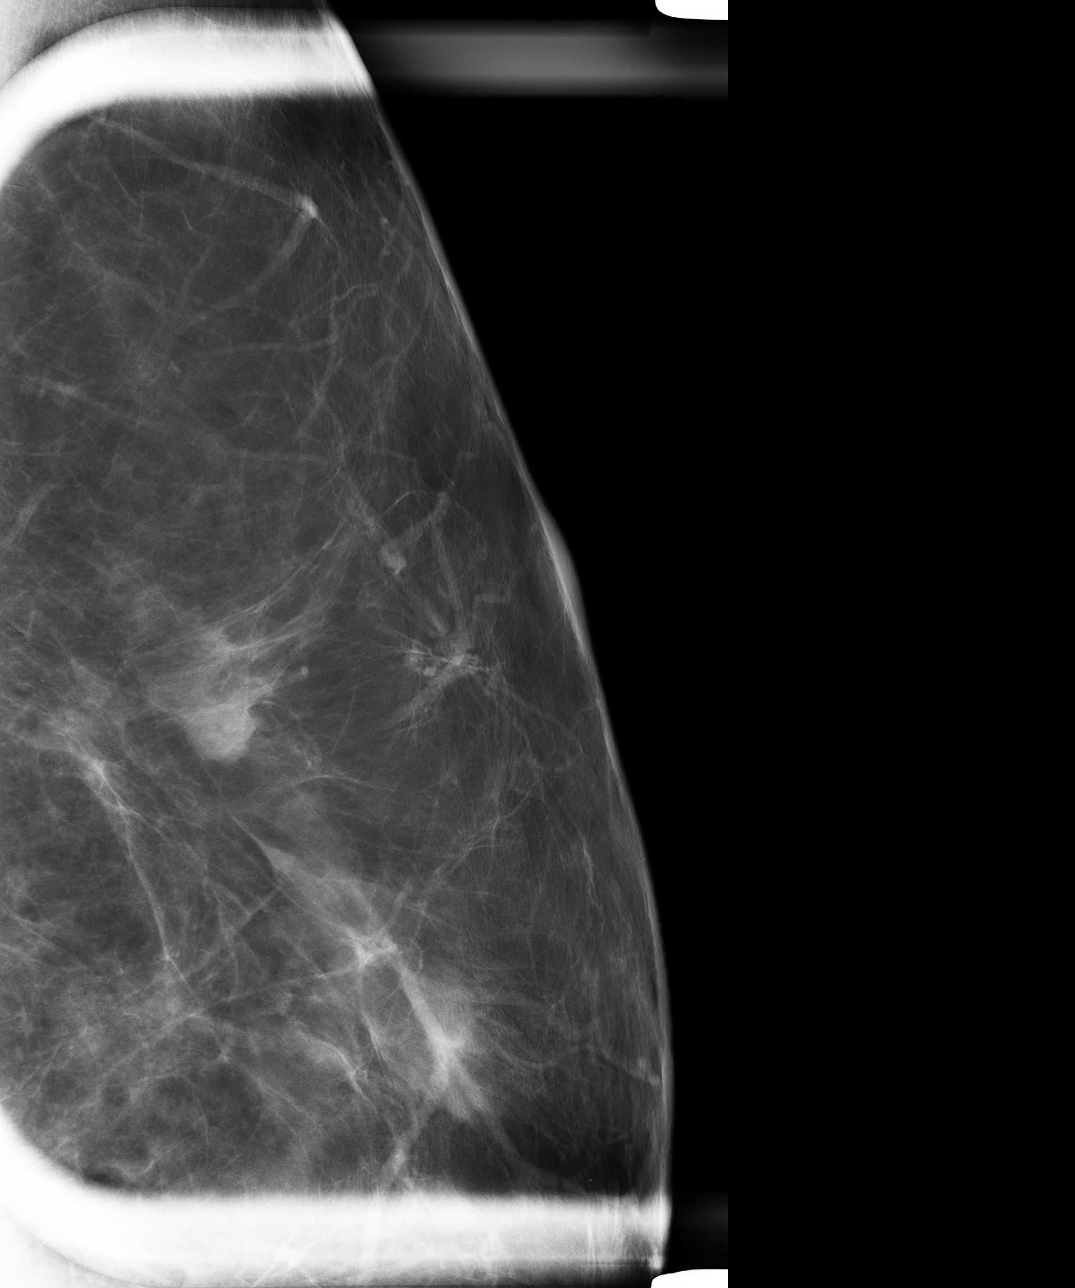
[im 4/4]
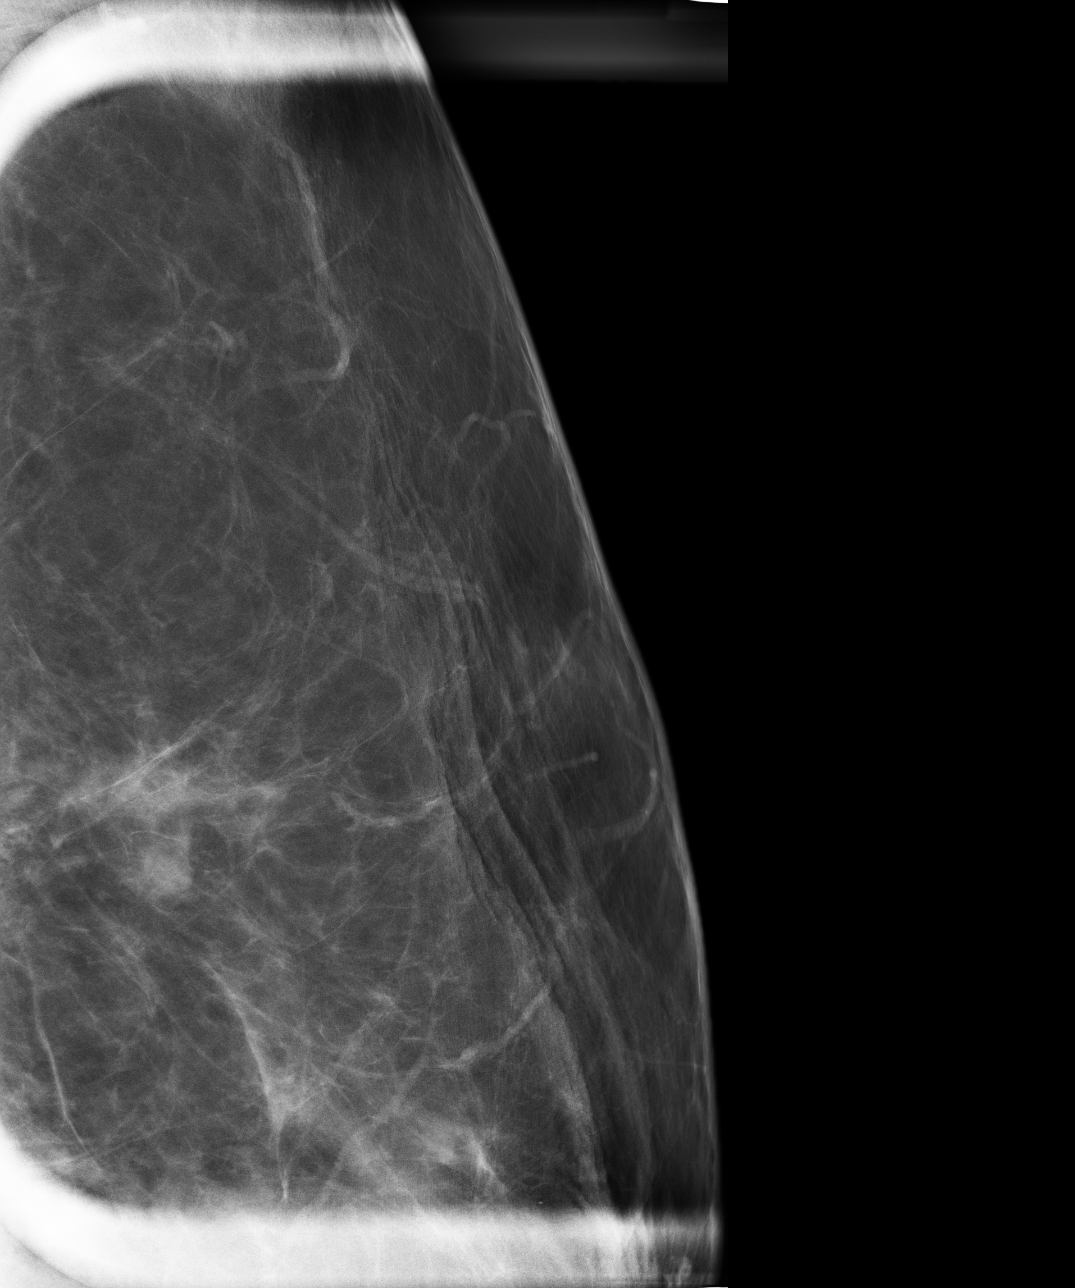

[4 of 4 positions shown; findings below may reference images not displayed]

FINDINGS: Spot compression magnification views are performed to evaluate the small
asymmetry in the lateral aspect of the left CC view. After the initial spot
compression magnification views were performed, the patient was called back
for additional focal spot compression magnification views of this region to
ensure that this area effaced. With these images, the area of the asymmetry
effaced and assumed the appearance of normal fibroglandular tissue that was
similar to prior studies, including 12/30/2009.

Real-time ultrasound was originally performed of the superolateral left
breast. No mass or suspicious shadowing was identified. The patient was
called back and the inferolateral left breast was also scanned. Again, no
mass or suspicious shadowing was identified.
IMPRESSION: 1.     BI-RADS: Category 1 Negative.
2.     Recommend continued annual mammography.

BREAST COMPOSITION: The breast composition is SCATTERED FIBROGLANDULAR
TISSUE (glandular tissue is 25-50%).

Thank you for this opportunity to contribute to the care of your patient.

A NEGATIVE MAMMOGRAM REPORT DOES NOT PRECLUDE BIOPSY OR OTHER EVALUATION OF
A CLINICALLY PALPABLE OR OTHERWISE SUSPICIOUS MASS OR LESION. BREAST CA[HOSPITAL]ER
MAY NOT BE DETECTED BY MAMMOGRAPHY IN UP TO 10% OF CASES.

## 2014-06-19 ENCOUNTER — Ambulatory Visit: Payer: Self-pay | Admitting: Internal Medicine

## 2014-07-23 ENCOUNTER — Ambulatory Visit: Payer: Self-pay | Admitting: Internal Medicine

## 2014-07-23 LAB — CBC CANCER CENTER
BASOS ABS: 0 x10 3/mm (ref 0.0–0.1)
BASOS PCT: 0.6 %
Eosinophil #: 0.1 x10 3/mm (ref 0.0–0.7)
Eosinophil %: 1.5 %
HCT: 40.5 % (ref 35.0–47.0)
HGB: 13.4 g/dL (ref 12.0–16.0)
LYMPHS ABS: 4.2 x10 3/mm — AB (ref 1.0–3.6)
LYMPHS PCT: 56.1 %
MCH: 31.7 pg (ref 26.0–34.0)
MCHC: 33.1 g/dL (ref 32.0–36.0)
MCV: 96 fL (ref 80–100)
Monocyte #: 0.8 x10 3/mm (ref 0.2–0.9)
Monocyte %: 11.4 %
Neutrophil #: 2.3 x10 3/mm (ref 1.4–6.5)
Neutrophil %: 30.4 %
Platelet: 236 x10 3/mm (ref 150–440)
RBC: 4.23 10*6/uL (ref 3.80–5.20)
RDW: 14.7 % — ABNORMAL HIGH (ref 11.5–14.5)
WBC: 7.5 x10 3/mm (ref 3.6–11.0)

## 2014-07-23 LAB — URIC ACID: URIC ACID: 1.9 mg/dL — AB (ref 2.6–6.0)

## 2014-07-23 LAB — PROTIME-INR
INR: 1.8
PROTHROMBIN TIME: 20.6 s — AB (ref 11.5–14.7)

## 2014-07-23 LAB — CREATININE, SERUM
Creatinine: 0.74 mg/dL (ref 0.60–1.30)
EGFR (African American): >60
EGFR (Non-African Amer.): >60

## 2014-07-23 LAB — LACTATE DEHYDROGENASE: LDH: 213 U/L (ref 81–246)

## 2014-08-19 ENCOUNTER — Ambulatory Visit: Payer: Self-pay | Admitting: Internal Medicine

## 2014-09-03 LAB — CBC CANCER CENTER
Basophil #: 0 x10 3/mm (ref 0.0–0.1)
Basophil %: 0.5 %
Eosinophil #: 0.1 x10 3/mm (ref 0.0–0.7)
Eosinophil %: 1.8 %
HCT: 38.9 % (ref 35.0–47.0)
HGB: 12.8 g/dL (ref 12.0–16.0)
LYMPHS ABS: 4 x10 3/mm — AB (ref 1.0–3.6)
Lymphocyte %: 50.5 %
MCH: 31.2 pg (ref 26.0–34.0)
MCHC: 33 g/dL (ref 32.0–36.0)
MCV: 94 fL (ref 80–100)
MONO ABS: 0.9 x10 3/mm (ref 0.2–0.9)
Monocyte %: 11.5 %
NEUTROS ABS: 2.9 x10 3/mm (ref 1.4–6.5)
Neutrophil %: 35.7 %
PLATELETS: 267 x10 3/mm (ref 150–440)
RBC: 4.12 10*6/uL (ref 3.80–5.20)
RDW: 15.2 % — AB (ref 11.5–14.5)
WBC: 8 x10 3/mm (ref 3.6–11.0)

## 2014-09-19 ENCOUNTER — Ambulatory Visit: Payer: Self-pay | Admitting: Internal Medicine

## 2014-10-20 ENCOUNTER — Ambulatory Visit: Payer: Self-pay | Admitting: Internal Medicine

## 2014-11-26 ENCOUNTER — Ambulatory Visit
Admit: 2014-11-26 | Disposition: A | Discharge status: Home / Self Care | Payer: Self-pay | Attending: Internal Medicine | Admitting: Internal Medicine

## 2014-12-19 ENCOUNTER — Ambulatory Visit
Admit: 2014-12-19 | Disposition: A | Discharge status: Home / Self Care | Payer: Self-pay | Attending: Internal Medicine | Admitting: Internal Medicine

## 2015-01-09 NOTE — Op Note (Signed)
PATIENT NAME:  Suzanne Dixon, Suzanne Dixon MR#:  [id number] DATE OF BIRTH:  1951-05-06  DATE OF PROCEDURE:  12/17/2012  PREOPERATIVE DIAGNOSIS:  Right chest wall wound dehiscence.   POSTOPERATIVE DIAGNOSIS:  Right chest wall wound dehiscence.   PROCEDURE:  Wound exploration and closure.   ANESTHESIA:  General.   SURGEON:  Rodena Goldmann, MD  OPERATIVE PROCEDURE:  With the patient in the supine position after the induction of appropriate general anesthesia, the patient's right chest was prepped with Betadine and draped with sterile towels. The wound was opened in the lateral aspect. It was copiously irrigated. No purulence was identified. There appeared to be a spontaneously drained seroma. The sutures were missing in the lateral aspect of the incision. There were several other areas which were dusky and one other area which was slightly opened. Two 19-French Blake drains were placed, 1 to the axilla and 1 to the superior flap secured with 3-0 nylon. The skin incisions were then reinforced medially and closed laterally with 3-0 nylon. Sterile dressings were applied. The patient was returned to the recovery room having tolerated the procedure well. Sponge, instrument and needle counts were correct x 2 in the operating room.    ____________________________ Micheline Maze, MD rle:si D: 12/17/2012 21:12:40 ET T: 12/17/2012 23:26:51 ET JOB#: [id number]  cc: Micheline Maze, MD, <Dictator> Adin Hector, MD Simonne Come. Inez Pilgrim, MD  Rodena Goldmann MD ELECTRONICALLY SIGNED 12/18/2012 0:34

## 2015-01-09 NOTE — Discharge Summary (Signed)
PATIENT NAME:  Suzanne Dixon, Suzanne Dixon MR#:  [id number] DATE OF BIRTH:  10/03/1950  DATE OF ADMISSION:  12/17/2012 DATE OF DISCHARGE:  12/18/2012  BRIEF HISTORY OF PRESENT ILLNESS:  The patient is a 64 year old woman who recently underwent a simple mastectomy on the right side.  The procedure was uncomplicated.  Drains were removed in the office last week after drainage dropped below 25 mL per day.  She presented back to the office with a small wound dehiscence.  The sutures in one area laterally were completely gone.  There was some concern about the etiology of that dehiscence, but it was clear that she could not remain with an open wound and an otherwise nonadherent flap.  She is admitted to the hospital, taken back to the operating room where the area was copiously irrigated, new drains placed and the wound reapproximated.  She was given intraoperative antibiotics and postoperative antibiotics, discharged home to be followed in the office in 7 days' time for drain removal.   DISCHARGE MEDICATIONS:  Metoprolol 100 mg 2 tablets twice a day, omeprazole 20 mg twice daily, allopurinol 300 mg twice daily, Levothroid 50 mcg once a day, calcium 600 mg twice a day, meloxicam 15 mg once a day, warfarin 9 mg once a day, Klor-Con 10 mEq twice daily, multivitamins 2 tablets a day, fluticasone 0.05 mg inhaler once a day, gabapentin 600 mg 3 times daily, furosemide 80 mg by mouth daily, alpha lipoic acid 1 tablet twice daily, and tramadol 100 mg every 4 to 6 hours as needed.   FINAL DISCHARGE DIAGNOSES:  1.  Breast cancer.  2.  Wound dehiscence.   SURGERY:  Wound dehiscence closure.      ____________________________ Micheline Maze, MD rle:ea D: 12/18/2012 20:35:00 ET T: 12/19/2012 00:01:18 ET JOB#: [id number]  cc: Rodena Goldmann III, MD, <Dictator> Rodena Goldmann MD ELECTRONICALLY SIGNED 12/19/2012 20:32

## 2015-01-09 NOTE — Op Note (Signed)
PATIENT NAME:  Suzanne Dixon, Suzanne Dixon MR#:  [id number] DATE OF BIRTH:  1951/09/19  DATE OF PROCEDURE:  01/31/2013  PREOPERATIVE DIAGNOSIS: Left cubital tunnel syndrome.   POSTOPERATIVE DIAGNOSIS: Left cubital tunnel syndrome.   PROCEDURE: Subcutaneous ulnar nerve transposition.   SURGEON: Laurene Footman, MD.   DESCRIPTION OF PROCEDURE: The patient was brought to the operating room and after adequate anesthesia was obtained, the left arm was prepped and draped in the usual sterile fashion. Sterile tourniquet was then applied and appropriate patient identification and timeout procedures completed. The arm was exsanguinated with an Esmarch and the tourniquet raised to 250 mmHg. A Learmonth  incision was then made over the medial epicondyle and the subcutaneous tissue spread. Medial epicondyle was identified and the  tissue was elevated off the muscle for subsequent placement of the nerve. The nerve was then identified posteriorly and released from the cubital tunnel. It had significant approximately 1.5 cm area of constriction just distal to the bend of the elbow under the ulnar  within the cubital tunnel was then freed up proximally and distally so could be mobilized and was then brought forward and when placed through a range of motion. There was no kinking or tethering. A flap was then created with a subcutaneous fat and fascia from the area of medial epicondyle to hold the nerve anteriorly and prevent dislocation back posteriorly. This was done using a 2-0 Vicryl again placing it through a range of motion. It appeared stable and again without compression. The wound was then closed with 2-0 Vicryl subcutaneously and 4-0 nylon in a simple interrupted fashion. A Prevena wound dressing was applied secondary to the patient being on Coumadin and then a long-arm splint was placed with appropriate padding and Ace wrap. The tourniquet was let down.   TOURNIQUET TIME: 52 minutes at 250 mmHg.   COMPLICATIONS: None.    ESTIMATED BLOOD LOSS: Minimal.   SPECIMEN:  No specimen.   FINDINGS: As noted above. Significant constriction of the ulnar nerve.   CONDITION TO RECOVERY ROOM:  Stable.     ____________________________ Laurene Footman, MD mjm:cc D: 01/31/2013 21:24:20 ET T: 01/31/2013 21:36:24 ET JOB#: [id number]  cc: Laurene Footman, MD, <Dictator> Laurene Footman MD ELECTRONICALLY SIGNED 02/01/2013 8:07

## 2015-01-09 NOTE — Op Note (Signed)
PATIENT NAME:  Suzanne Dixon, Suzanne Dixon MR#:  [id number] DATE OF BIRTH:  [birth year]  DATE OF PROCEDURE:  12/05/2012  PREOPERATIVE DIAGNOSIS: Right breast carcinoma.   POSTOPERATIVE DIAGNOSIS: Right breast carcinoma.   PROCEDURES:  1.  Right breast mastectomy. 2.  Port placement.   SURGEON: Rodena Goldmann, MD  ASSISTANTAnastasia Fiedler, [place] Student  ANESTHESIA:  General.   DESCRIPTION OF PROCEDURE: With the patient in the supine position, after induction of appropriate general anesthesia, the patient's right breast was prepped with ChloraPrep and draped in sterile towels. An elliptical incision was made around the site of the previous biopsy and carried down through the subcutaneous tissue with Bovie electrocautery. Anterior flap and inferior flaps were created using suture traction down to the chest wall. The third intercostal space was used superiorly and just below the inframammary fold was used inferiorly. There were multiple areas of scarring from previous surgery and therapy. The breast was then swept off medial to lateral ligating the large perforating vessels with cautery. The specimen was taken off just medial to the latissimus muscle. It was marked and sent for permanent sections. Flat Jackson-Pratt drains, 10 mm in size, were inserted through the inferior flap and placed 1 to the superior flap and 1 to the inferior flap. Hemostasis appeared to be satisfactory. The area was then irrigated and the incision closed with vertical mattress sutures of 4-0 nylon. The drain was secured with 3-0 nylon, compressive dressing applied.   Attention was then turned to the patient's left neck. The left neck was investigated with the ultrasound and there did appear to be a large patent jugular vein. No attempt was made to perform subclavian placement because of her size. She was appropriately padded and positioned. The neck was prepped with ChloraPrep and draped in sterile towels. A small incision was made over the  identified area. The vein was cannulated with a single pass and the wire passed into the great vessel system without difficulty.  Fluoroscopy was used to manipulate the wire into the great system, but would not go into the atrium.  The wire was exchanged for an introducer and then the catheter placed through the introducer after removing the wire and dilator.  Using contrast the flexible catheter was manipulated in the great vessel system and into the atrium. It  was backed up to just into the atrium. As soon as the catheter came out of the atrium, it would flip up into the right arm. No ectopy was identified. The catheter was then tunneled to a separate second incision where a subcutaneous suprafascial pocket was created for the Port-A-Cath device. Conray was used to provide proper placement. The device was filled with heparinized saline and sutured to the chest wall. There were no kinks or leaks noted. The subcutaneous space was obliterated with 3-0 Vicryl and the skin was closed with 4-0 nylon. Sterile dressings were applied. The patient was returned to the recovery room having tolerated the procedure well. Sponge, instrument and needle counts were correct x 2 in the operating room. ____________________________ Micheline Maze, MD rle:sb D: 12/05/2012 14:07:30 ET T: 12/05/2012 14:21:13 ET JOB#: [id number]  cc: Micheline Maze, MD, <Dictator> Adin Hector, MD Simonne Come. Inez Pilgrim, MD  Rodena Goldmann MD ELECTRONICALLY SIGNED 12/06/2012 10:41

## 2015-01-09 NOTE — Discharge Summary (Signed)
PATIENT NAME:  Suzanne Dixon, Suzanne Dixon MR#:  [id number] DATE OF BIRTH:  02/07/1951  DATE OF ADMISSION:  12/05/2012 DATE OF DISCHARGE:  12/07/2012  BRIEF HISTORY: The patient is a 64 year old woman with newly discovered carcinoma of the right breast. She has had previous breast carcinoma with axillary dissection, lumpectomy and radiation. She had a recurrent cancer on the same side identified by open biopsy. After appropriate oncologic evaluation, she was set up for surgical intervention and port placement. After appropriate preoperative preparation and informed consent, she was taken to surgery on the morning of 12/05/2012, where she underwent a mastectomy. Her procedure was uncomplicated. A Port-A-Cath was placed at the same time. She recovered uneventfully. Her wounds look good. There is no sign of any infection. She is discharged home today to be followed in the office in 3 to 5 days' time for drain removal.   DISCHARGE MEDICATIONS: Include: 1. Allopurinol 300 mg b.i.d.  2. Calcium 600 mg daily.  3. Fluconazole nasal spray. 4. Furosemide 80 mg p.o. daily.  5. Gabapentin 600 mg t.i.d.  6. Klor-Con 10 mEq daily.  7. Synthroid 0.05 mg daily. 8. Meloxicam 15 mg once a day.  9. Metoprolol 100 mg 2 tablets once a day.  10. Omeprazole 20 mg once a day.  11. Tramadol 100 mg every 4 to 6 hours p.r.n.  12. She is also discharged on warfarin 5 mg p.o. daily.   FINAL DISCHARGE DIAGNOSIS: Breast carcinoma.  SURGERY: Right mastectomy with port placement.   ____________________________ Micheline Maze, MD rle:OSi D: 12/07/2012 16:16:00 ET T: 12/08/2012 09:45:01 ET JOB#: [id number]  cc: Micheline Maze, MD, <Dictator> Adin Hector, MD Simonne Come. Inez Pilgrim, MD Rodena Goldmann MD ELECTRONICALLY SIGNED 12/09/2012 17:31

## 2015-01-19 ENCOUNTER — Emergency Department
Admission: EM | Admit: 2015-01-19 | Discharge: 2015-01-19 | Disposition: A | Discharge status: Home / Self Care | Payer: Medicare Other | Attending: Emergency Medicine | Admitting: Emergency Medicine

## 2015-01-19 ENCOUNTER — Encounter: Payer: Self-pay | Admitting: Emergency Medicine

## 2015-01-19 DIAGNOSIS — X58XXXA Exposure to other specified factors, initial encounter: Secondary | ICD-10-CM | POA: Diagnosis not present

## 2015-01-19 DIAGNOSIS — S3992XA Unspecified injury of lower back, initial encounter: Secondary | ICD-10-CM | POA: Diagnosis present

## 2015-01-19 DIAGNOSIS — Z7951 Long term (current) use of inhaled steroids: Secondary | ICD-10-CM | POA: Diagnosis not present

## 2015-01-19 DIAGNOSIS — Y9389 Activity, other specified: Secondary | ICD-10-CM | POA: Insufficient documentation

## 2015-01-19 DIAGNOSIS — Z79899 Other long term (current) drug therapy: Secondary | ICD-10-CM | POA: Insufficient documentation

## 2015-01-19 DIAGNOSIS — Z7901 Long term (current) use of anticoagulants: Secondary | ICD-10-CM | POA: Diagnosis not present

## 2015-01-19 DIAGNOSIS — Y998 Other external cause status: Secondary | ICD-10-CM | POA: Insufficient documentation

## 2015-01-19 DIAGNOSIS — Y9289 Other specified places as the place of occurrence of the external cause: Secondary | ICD-10-CM | POA: Insufficient documentation

## 2015-01-19 DIAGNOSIS — S39012A Strain of muscle, fascia and tendon of lower back, initial encounter: Secondary | ICD-10-CM | POA: Insufficient documentation

## 2015-01-19 DIAGNOSIS — M62830 Muscle spasm of back: Secondary | ICD-10-CM

## 2015-01-19 HISTORY — DX: Other cervical disc displacement, unspecified cervical region: M50.20

## 2015-01-19 HISTORY — DX: Acquired absence of both cervix and uterus: Z90.710

## 2015-01-19 HISTORY — DX: Acquired absence of unspecified breast and nipple: Z90.10

## 2015-01-19 HISTORY — DX: History of uterine scar from previous surgery: Z98.891

## 2015-01-19 MED ORDER — DIAZEPAM 5 MG/ML IJ SOLN
10.0000 mg | Freq: Once | INTRAMUSCULAR | Status: AC
  Administered 2015-01-19: 10 mg via INTRAVENOUS

## 2015-01-19 MED ORDER — DIAZEPAM 5 MG/ML IJ SOLN
INTRAMUSCULAR | Status: AC
  Administered 2015-01-19: 10 mg via INTRAVENOUS
  Filled 2015-01-19: qty 2

## 2015-01-19 MED ORDER — CYCLOBENZAPRINE HCL 5 MG PO TABS: 5.0000 mg | ORAL_TABLET | Freq: Three times a day (TID) | ORAL | Status: DC | PRN

## 2015-01-19 NOTE — ED Notes (Signed)
Pt here for back spasms in lower back.

## 2015-01-19 NOTE — ED Provider Notes (Signed)
[facility] Emergency Department Provider Note    ____________________________________________  Time seen:1800  I have reviewed the triage vital signs and the nursing notes.   HISTORY  Chief Complaint Back Pain       HPI Suzanne Dixon is a 64 y.o. female complaining of back spasm and cramping that started over the last couple days was worse today called EMS to be brought here for evaluation because she couldn't get the spasms to stop rated as a 10 out of 10 at worst currently about a 6 out of 10 states that the last couple days she's been bending over to pick up her grandchild or chill while walking and being more active than normal and her back to spasm and locked up no other complaints at this time states sitting perfectly still makes it better any type of movement has been making it worse     Past Medical History  Diagnosis Date  . H/O: cesarean section 1987    per patient report  . H/O: hysterectomy 1996    per patient  . Ruptured cervical disc 2002    per patient   . H/O mastectomy 2014    per patient    There are no active problems to display for this patient.   No past surgical history on file.  Current Outpatient Rx  Name  Route  Sig  Dispense  Refill  . allopurinol (ZYLOPRIM) 300 MG tablet   Oral   Take 300 mg by mouth 2 (two) times daily.         . calcium-vitamin D 250-100 MG-UNIT per tablet   Oral   Take 2 tablets by mouth 2 (two) times daily.         . DULoxetine (CYMBALTA) 30 MG capsule   Oral   Take 30 mg by mouth daily.         . fluticasone (FLONASE) 50 MCG/ACT nasal spray   Each Nare   Place 2 sprays into both nostrils daily.         Marland Kitchen gabapentin (NEURONTIN) 300 MG capsule   Oral   Take 300 mg by mouth 3 (three) times daily.         Marland Kitchen GABAPENTIN, ONCE-DAILY, PO   Oral   Take by mouth.         . levothyroxine (SYNTHROID, LEVOTHROID) 50 MCG tablet   Oral   Take 50 mcg by mouth daily before  breakfast.         . Levothyroxine Sodium (LEVOTHROID PO)   Oral   Take 50 mcg by mouth.         . meloxicam (MOBIC) 15 MG tablet   Oral   Take 15 mg by mouth daily.         . metoprolol (LOPRESSOR) 100 MG tablet   Oral   Take 100 mg by mouth 2 (two) times daily.         . Multiple Vitamins-Minerals (MULTIVITAMIN ADULT PO)   Oral   Take 1 tablet by mouth daily.         Marland Kitchen omeprazole (PRILOSEC) 20 MG capsule   Oral   Take 20 mg by mouth daily.         . potassium chloride (K-DUR,KLOR-CON) 10 MEQ tablet   Oral   Take 10 mEq by mouth 2 (two) times daily.         . traMADol (ULTRAM) 50 MG tablet   Oral   Take 50 mg by  mouth every 6 (six) hours as needed.         . vitamin B-12 (CYANOCOBALAMIN) 500 MCG tablet   Oral   Take 500 mcg by mouth daily.         Marland Kitchen warfarin (COUMADIN) 6 MG tablet   Oral   Take 6 mg by mouth daily.         . cyclobenzaprine (FLEXERIL) 5 MG tablet   Oral   Take 1 tablet (5 mg total) by mouth every 8 (eight) hours as needed for muscle spasms.   12 tablet   1     Allergies Singulair; Sulfa antibiotics; and Venlafaxine  No family history on file.  Social History History  Substance Use Topics  . Smoking status: Never Smoker   . Smokeless tobacco: Not on file  . Alcohol Use: Not on file    Review of Systems  Constitutional: Negative for fever. Eyes: Negative for visual changes. ENT: Negative for sore throat. Cardiovascular: Negative for chest pain. Respiratory: Negative for shortness of breath. Gastrointestinal: Negative for abdominal pain, vomiting and diarrhea. Genitourinary: Negative for dysuria. Musculoskeletal: Negative for back pain. Skin: Negative for rash. Neurological: Negative for headaches, focal weakness or numbness.   10-point ROS otherwise negative. Except for previously noted in the history of present illness  ____________________________________________   PHYSICAL EXAM:  VITAL SIGNS: ED  Triage Vitals  Enc Vitals Group     BP 01/19/15 1815 213/91 mmHg     Pulse Rate 01/19/15 1815 66     Resp 01/19/15 1815 18     Temp 01/19/15 1815 97.9 F (36.6 C)     Temp Source 01/19/15 1815 Oral     SpO2 01/19/15 1815 94 %     Weight --      Height --      Head Cir --      Peak Flow --      Pain Score 01/19/15 1833 7     Pain Loc --      Pain Edu? --      Excl. in [place]? --      Constitutional: Alert and oriented. Well appearing and in no distress. Eyes: Conjunctivae are normal. PERRL. Normal extraocular movements. ENT   Head: Normocephalic and atraumatic.   Nose: No congestion/rhinnorhea.   Mouth/Throat: Mucous membranes are moist.   Neck: No stridor. Hematological/Lymphatic/Immunilogical: No cervical lymphadenopathy. Cardiovascular: Normal rate, regular rhythm. Normal and symmetric distal pulses are present in all extremities. No murmurs, rubs, or gallops. Respiratory: Normal respiratory effort without tachypnea nor retractions. Breath sounds are clear and equal bilaterally. No wheezes/rales/rhonchi.  Musculoskeletal: Patient tight spasm muscles across her low and thoracic back not notable deformities step-offs or other abnormal findings Neurologic:  Normal speech and language. No gross focal neurologic deficits are appreciated. Speech is normal. No gait instability. Skin:  Skin is warm, dry and intact. No rash noted. Psychiatric: Mood and affect are normal. Speech and behavior are normal. Patient exhibits appropriate insight and judgment.    PROCEDURES  Procedure(s) performed: None  Critical Care performed: No  ____________________________________________   INITIAL IMPRESSION / ASSESSMENT AND PLAN / ED COURSE  Pertinent labs & imaging results that were available during my care of the patient were reviewed by me and considered in my medical decision making (see chart for details).  Initial impression lumbosacral thoracic back spasm and strain start  patient on muscle relaxer have follow-up with primary care provider in one day return for any acute concerns or worsening  symptoms patient was given a shot of volume in the department with almost complete relief of symptoms  ____________________________________________   FINAL CLINICAL IMPRESSION(S) / ED DIAGNOSES  Final diagnoses:  Back strain, initial encounter  Spasm of back muscles    Rayona Sardinha Verdene Rio, PA-C 01/19/15 1936  Nance Pear, MD 01/20/15 [phone]

## 2015-02-18 ENCOUNTER — Inpatient Hospital Stay: Payer: Medicare Other | Attending: Family Medicine

## 2015-02-18 ENCOUNTER — Encounter (INDEPENDENT_AMBULATORY_CARE_PROVIDER_SITE_OTHER): Payer: Self-pay

## 2015-02-18 DIAGNOSIS — C801 Malignant (primary) neoplasm, unspecified: Secondary | ICD-10-CM

## 2015-02-18 DIAGNOSIS — Z452 Encounter for adjustment and management of vascular access device: Secondary | ICD-10-CM | POA: Insufficient documentation

## 2015-02-18 MED ORDER — HEPARIN SOD (PORK) LOCK FLUSH 100 UNIT/ML IV SOLN
INTRAVENOUS | Status: AC
  Filled 2015-02-18: qty 5

## 2015-02-18 MED ORDER — SODIUM CHLORIDE 0.9 % IJ SOLN
10.0000 mL | Freq: Once | INTRAMUSCULAR | Status: AC
  Administered 2015-02-18: 10 mL via INTRAVENOUS
  Filled 2015-02-18: qty 10

## 2015-02-18 MED ORDER — HEPARIN SOD (PORK) LOCK FLUSH 100 UNIT/ML IV SOLN
500.0000 [IU] | Freq: Once | INTRAVENOUS | Status: AC
  Administered 2015-02-18: 500 [IU] via INTRAVENOUS

## 2015-04-01 ENCOUNTER — Inpatient Hospital Stay: Payer: Medicare Other

## 2015-04-01 ENCOUNTER — Inpatient Hospital Stay: Payer: Medicare Other | Attending: Family Medicine | Admitting: Family Medicine

## 2015-04-01 ENCOUNTER — Telehealth: Payer: Self-pay | Admitting: *Deleted

## 2015-04-01 ENCOUNTER — Encounter: Payer: Self-pay | Admitting: Family Medicine

## 2015-04-01 VITALS — BP 174/115 | HR 74 | Temp 98.0°F | Wt 341.3 lb

## 2015-04-01 DIAGNOSIS — I972 Postmastectomy lymphedema syndrome: Secondary | ICD-10-CM | POA: Insufficient documentation

## 2015-04-01 DIAGNOSIS — G4730 Sleep apnea, unspecified: Secondary | ICD-10-CM | POA: Insufficient documentation

## 2015-04-01 DIAGNOSIS — Z452 Encounter for adjustment and management of vascular access device: Secondary | ICD-10-CM | POA: Diagnosis not present

## 2015-04-01 DIAGNOSIS — D72820 Lymphocytosis (symptomatic): Secondary | ICD-10-CM | POA: Insufficient documentation

## 2015-04-01 DIAGNOSIS — R918 Other nonspecific abnormal finding of lung field: Secondary | ICD-10-CM | POA: Diagnosis not present

## 2015-04-01 DIAGNOSIS — E559 Vitamin D deficiency, unspecified: Secondary | ICD-10-CM | POA: Insufficient documentation

## 2015-04-01 DIAGNOSIS — Z9221 Personal history of antineoplastic chemotherapy: Secondary | ICD-10-CM | POA: Insufficient documentation

## 2015-04-01 DIAGNOSIS — G64 Other disorders of peripheral nervous system: Secondary | ICD-10-CM | POA: Insufficient documentation

## 2015-04-01 DIAGNOSIS — Z7901 Long term (current) use of anticoagulants: Secondary | ICD-10-CM | POA: Diagnosis not present

## 2015-04-01 DIAGNOSIS — D831 Common variable immunodeficiency with predominant immunoregulatory T-cell disorders: Secondary | ICD-10-CM | POA: Insufficient documentation

## 2015-04-01 DIAGNOSIS — Z853 Personal history of malignant neoplasm of breast: Secondary | ICD-10-CM | POA: Diagnosis not present

## 2015-04-01 DIAGNOSIS — Z79899 Other long term (current) drug therapy: Secondary | ICD-10-CM | POA: Diagnosis not present

## 2015-04-01 DIAGNOSIS — R600 Localized edema: Secondary | ICD-10-CM | POA: Insufficient documentation

## 2015-04-01 DIAGNOSIS — M109 Gout, unspecified: Secondary | ICD-10-CM | POA: Insufficient documentation

## 2015-04-01 DIAGNOSIS — Z171 Estrogen receptor negative status [ER-]: Secondary | ICD-10-CM | POA: Insufficient documentation

## 2015-04-01 DIAGNOSIS — M1990 Unspecified osteoarthritis, unspecified site: Secondary | ICD-10-CM | POA: Insufficient documentation

## 2015-04-01 DIAGNOSIS — Z90710 Acquired absence of both cervix and uterus: Secondary | ICD-10-CM | POA: Diagnosis not present

## 2015-04-01 DIAGNOSIS — T7840XA Allergy, unspecified, initial encounter: Secondary | ICD-10-CM | POA: Insufficient documentation

## 2015-04-01 DIAGNOSIS — Z9011 Acquired absence of right breast and nipple: Secondary | ICD-10-CM | POA: Diagnosis not present

## 2015-04-01 DIAGNOSIS — C50919 Malignant neoplasm of unspecified site of unspecified female breast: Secondary | ICD-10-CM | POA: Insufficient documentation

## 2015-04-01 DIAGNOSIS — C50911 Malignant neoplasm of unspecified site of right female breast: Secondary | ICD-10-CM

## 2015-04-01 DIAGNOSIS — C801 Malignant (primary) neoplasm, unspecified: Secondary | ICD-10-CM | POA: Insufficient documentation

## 2015-04-01 DIAGNOSIS — L732 Hidradenitis suppurativa: Secondary | ICD-10-CM | POA: Insufficient documentation

## 2015-04-01 MED ORDER — HEPARIN SOD (PORK) LOCK FLUSH 100 UNIT/ML IV SOLN
INTRAVENOUS | Status: AC
  Filled 2015-04-01: qty 5

## 2015-04-01 MED ORDER — SODIUM CHLORIDE 0.9 % IJ SOLN
10.0000 mL | INTRAMUSCULAR | Status: DC | PRN
  Administered 2015-04-01: 10 mL via INTRAVENOUS
  Filled 2015-04-01: qty 10

## 2015-04-01 MED ORDER — HEPARIN SOD (PORK) LOCK FLUSH 100 UNIT/ML IV SOLN
500.0000 [IU] | Freq: Once | INTRAVENOUS | Status: AC
  Administered 2015-04-01: 500 [IU] via INTRAVENOUS

## 2015-04-01 NOTE — Telephone Encounter (Signed)
Port removal orders faxed to Dia Crawford, MD with [facility] at [phone] as discussed with Georgeanne Nim, AGNP-C.Marland KitchenMarland Kitchen

## 2015-04-01 NOTE — Progress Notes (Signed)
[place]  [phone]) [phone]  [phone]) [phone]     Suzanne Dixon DOB: [birth year]  MR#: [id number]  IWL#:[id number]  Patient Care Team: Adin Hector, MD as PCP - General (Internal Medicine)  CHIEF COMPLAINT:  Chief Complaint  Patient presents with  . Follow-up  . Breast Cancer   Patient also with medical history of mild lymphocytosis with a CLL panel revealing abnormal T-cell antigens and T-cell clonal gene rearrangement. HIV, SPEP and CT/PET for 2012 reported as negative. Breast cancer diagnosed in 2014, ER/PR negative HER-2/neu negative, BRCA negative. Patient is status post right mastectomy following diagnosis of infiltrating ductal carcinoma. Patient has subsequent right arm lymphedema. Patient also has significant history for benign pulmonary nodules noted on CT scan from 2013, that have remained stable. Last CT March 2015.  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 Patient completed 6 cycles of FEC in 2014. She recently had a mammogram in March 2016 that was reported as negative. She is followed closely by her primary care provider Dr. Caryl Comes. Patient also has a history of T-cell disorder with lymphocytosis, no suspicion of T-cell lymphoma. Patient overall feels very well and denies any complaints.  REVIEW OF SYSTEMS:   Review of Systems  Constitutional: Negative for fever, chills, weight loss, malaise/fatigue and diaphoresis.  HENT: Negative for congestion, ear discharge, ear pain, hearing loss, nosebleeds, sore throat and tinnitus.   Eyes: Negative for blurred vision, double vision, photophobia, pain, discharge and redness.  Respiratory: Negative for cough, hemoptysis, sputum production, shortness of breath, wheezing and stridor.   Cardiovascular: Negative for chest pain, palpitations, orthopnea, claudication, leg swelling and PND.    Gastrointestinal: Negative for heartburn, nausea, vomiting, abdominal pain, diarrhea, constipation, blood in stool and melena.  Genitourinary: Negative.   Musculoskeletal: Negative.   Skin: Negative.   Neurological: Negative for dizziness, tingling, focal weakness, seizures, weakness and headaches.  Endo/Heme/Allergies: Bruises/bleeds easily.  Psychiatric/Behavioral: Negative for depression. The patient is not nervous/anxious and does not have insomnia.     As per HPI. Otherwise, a complete review of systems is negatve.  ONCOLOGY HISTORY:  No history exists.    PAST MEDICAL HISTORY: Past Medical History  Diagnosis Date  . H/O: cesarean section 1987    per patient report  . H/O: hysterectomy 1996    per patient  . Ruptured cervical disc 2002    per patient   . H/O mastectomy 2014    per patient  . Breast cancer 04/01/2015    PAST SURGICAL HISTORY: No past surgical history on file.  FAMILY HISTORY No family history on file.  GYNECOLOGIC HISTORY:  No LMP recorded. Patient has had a hysterectomy.     ADVANCED DIRECTIVES:    HEALTH MAINTENANCE: History  Substance Use Topics  . Smoking status: Never Smoker   . Smokeless tobacco: Not on file  . Alcohol Use: Not on file     Colonoscopy:  PAP:  Bone density:  Lipid panel:  Allergies  Allergen Reactions  . Singulair [Montelukast]     "muscle pain"  . Sulfa Antibiotics Hives  . Venlafaxine     "altered mental status/tremors"    Current Outpatient Prescriptions  Medication Sig Dispense Refill  . allopurinol (ZYLOPRIM) 300 MG tablet Take 300 mg by mouth 2 (two) times daily.    Marland Kitchen azelastine (ASTELIN) 0.1 % nasal spray Place into the nose.    . Calcium Carb-Cholecalciferol (  SM CALCIUM-VITAMIN D) 600-400 MG-UNIT TABS Take by mouth.    . calcium-vitamin D 250-100 MG-UNIT per tablet Take 2 tablets by mouth 2 (two) times daily.    . clindamycin (CLEOCIN T) 1 % SWAB USE EVERY MORNING    . Cyanocobalamin (RA VITAMIN  B-12 TR) 1000 MCG TBCR Take by mouth.    . cyclobenzaprine (FLEXERIL) 5 MG tablet Take 1 tablet (5 mg total) by mouth every 8 (eight) hours as needed for muscle spasms. 12 tablet 1  . doxycycline (ADOXA) 100 MG tablet TAKE 1 TABLET TWICE A DAY    . DULoxetine (CYMBALTA) 30 MG capsule Take 30 mg by mouth daily.    . fluticasone (FLONASE) 50 MCG/ACT nasal spray Place 2 sprays into both nostrils daily.    Marland Kitchen gabapentin (NEURONTIN) 300 MG capsule Take 300 mg by mouth 3 (three) times daily.    Marland Kitchen gabapentin (NEURONTIN) 600 MG tablet Take 600 mg by mouth 3 (three) times daily.  11  . levothyroxine (SYNTHROID, LEVOTHROID) 50 MCG tablet Take 50 mcg by mouth daily before breakfast.    . meloxicam (MOBIC) 15 MG tablet Take 15 mg by mouth daily.    . metoprolol (LOPRESSOR) 100 MG tablet Take 100 mg by mouth 2 (two) times daily.    . Multiple Vitamins tablet Take by mouth.    Marland Kitchen omeprazole (PRILOSEC) 20 MG capsule Take 20 mg by mouth daily.    . potassium chloride (K-DUR,KLOR-CON) 10 MEQ tablet Take 10 mEq by mouth 2 (two) times daily.    . traMADol (ULTRAM) 50 MG tablet Take 50 mg by mouth every 6 (six) hours as needed.    . triamcinolone cream (KENALOG) 0.1 % Apply topically.    . warfarin (COUMADIN) 1 MG tablet TAKE 1 TABLET (1 MG TOTAL) BY MOUTH ONCE DAILY.  11  . warfarin (COUMADIN) 2 MG tablet TAKE 1 TABLET (2 MG TOTAL) BY MOUTH ONCE DAILY.  11  . warfarin (COUMADIN) 5 MG tablet      No current facility-administered medications for this visit.   Facility-Administered Medications Ordered in Other Visits  Medication Dose Route Frequency Provider Last Rate Last Dose  . sodium chloride 0.9 % injection 10 mL  10 mL Intravenous PRN Evlyn Kanner, NP   10 mL at 04/01/15 1430    OBJECTIVE: BP 174/115 mmHg  Pulse 74  Temp(Src) 98 F (36.7 C) (Tympanic)  Wt 341 lb 4.4 oz (154.8 kg)   There is no height on file to calculate BMI.    ECOG FS:0 - Asymptomatic  General: Well-developed, well-nourished, no  acute distress. Eyes: Pink conjunctiva, anicteric sclera. HEENT: Normocephalic, moist mucous membranes, clear oropharnyx. Lungs: Clear to auscultation bilaterally. Heart: Regular rate and rhythm. No rubs, murmurs, or gallops. Abdomen: Soft, nontender, nondistended. No organomegaly noted, normoactive bowel sounds. Musculoskeletal: No edema, cyanosis, or clubbing. Neuro: Alert, answering all questions appropriately. Cranial nerves grossly intact. Skin: No rashes or petechiae noted. Psych: Normal affect. Lymphatics: No cervical, calvicular, axillary or inguinal LAD.   LAB RESULTS:  No visits with results within 3 Day(s) from this visit. Latest known visit with results is:  [facility] Conversion on 08/19/2014  Component Date Value Ref Range Status  . WBC 09/03/2014 8.0  3.6-11.0 x10 3/mm  Final  . RBC 09/03/2014 4.12  3.80-5.20 x10 6/mm  Final  . HGB 09/03/2014 12.8  12.0-16.0 g/dL Final  . HCT 09/03/2014 38.9  35.0-47.0 % Final  . MCV 09/03/2014 94  80-100 fL Final  .  MCH 09/03/2014 31.2  26.0-34.0 pg Final  . MCHC 09/03/2014 33.0  32.0-36.0 g/dL Final  . RDW 09/03/2014 15.2* 11.5-14.5 % Final  . Platelet 09/03/2014 267  150-440 x10 3/mm  Final  . Neutrophil % 09/03/2014 35.7   Final  . Lymphocyte % 09/03/2014 50.5   Final  . Monocyte % 09/03/2014 11.5   Final  . Eosinophil % 09/03/2014 1.8   Final  . Basophil % 09/03/2014 0.5   Final  . Neutrophil # 09/03/2014 2.9  1.4-6.5 x10 3/mm  Final  . Lymphocyte # 09/03/2014 4.0* 1.0-3.6 x10 3/mm  Final  . Monocyte # 09/03/2014 0.9  0.2-0.9 x10 3/mm  Final  . Eosinophil # 09/03/2014 0.1  0.0-0.7 x10 3/mm  Final  . Basophil # 09/03/2014 0.0  0.0-0.1 x10 3/mm  Final    STUDIES: No results found.  ASSESSMENT:  Breast cancer  PLAN:  1. Breast CA. 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 Patient completed 6 cycles of FEC chemotherapy in 2014. She is requesting removal of  her Port-A-Cath. We'll refer patient back to Dr. Rolin Barry office for removal. Mammogram from March 2016 reported as BI-RADS 1, negative. Patient encouraged to continue with routine monthly self breast exams. Dr. Caryl Comes following regularly for primary care as well as Coumadin management. Next follow-up 04/10/2015. Most recent lab work has been reviewed from [facility].  We'll continue with routine follow-up in 6 months with continued lab work.  Patient expressed understanding and was in agreement with this plan. She also understands that She can call clinic at any time with any questions, concerns, or complaints.   Dr. Oliva Bustard was available for consultation and review of plan of care for this patient.   Evlyn Kanner, NP   04/01/2015 3:28 PM

## 2015-04-15 ENCOUNTER — Ambulatory Visit (INDEPENDENT_AMBULATORY_CARE_PROVIDER_SITE_OTHER): Payer: Medicare Other | Admitting: Surgery

## 2015-04-15 ENCOUNTER — Encounter: Payer: Self-pay | Admitting: Surgery

## 2015-04-15 VITALS — BP 172/100 | HR 68 | Temp 98.0°F | Ht 66.0 in | Wt 335.0 lb

## 2015-04-15 DIAGNOSIS — D0590 Unspecified type of carcinoma in situ of unspecified breast: Secondary | ICD-10-CM | POA: Diagnosis not present

## 2015-04-15 NOTE — Telephone Encounter (Signed)
error 

## 2015-04-15 NOTE — Patient Instructions (Signed)
Angie will be contacting you to schedule your port removal surgery.  Please stop taking your Coumadin/Warfarin three days prior to your surgery and restart it the following day.  If you have any questions, please give [place] a call.

## 2015-04-15 NOTE — Progress Notes (Signed)
Outpatient Surgical Follow Up  04/15/2015  Suzanne Dixon is an 64 y.o. female.   CC: Patient requesting port removal  HPI: This a patient with a history of breast cancer treated back in 2014. She no longer requires her port. She does not anticipate needing it in the future. And wants it removed.  Past Medical History  Diagnosis Date  . H/O: cesarean section 1987    per patient report  . H/O: hysterectomy 1996    per patient  . Ruptured cervical disc 2002    per patient   . H/O mastectomy 2014    per patient  . Breast cancer 04/01/2015    History reviewed. No pertinent past surgical history.  Family History  Problem Relation Age of Onset  . Hypertension Mother   . Lymphoma Mother   . Cancer Sister     Breast    Social History:  reports that she has never smoked. She does not have any smokeless tobacco history on file. She reports that she does not drink alcohol. Her drug history is not on file.  Allergies:  Allergies  Allergen Reactions  . Singulair [Montelukast]     "muscle pain"  . Sulfa Antibiotics Hives  . Venlafaxine     "altered mental status/tremors"    Medications reviewed.   Review of Systems:   Review of Systems  Constitutional: Negative.   HENT: Negative.   Eyes: Negative.   Respiratory: Negative.   Cardiovascular: Negative.   Gastrointestinal: Negative.   Skin: Negative.   All other systems reviewed and are negative.    Physical Exam:  BP 172/100 mmHg  Pulse 68  Temp(Src) 98 F (36.7 C) (Oral)  Ht 5\' 6"  (1.676 m)  Wt 335 lb (151.955 kg)  BMI 54.10 kg/m2  Physical Exam  Constitutional: She is oriented to person, place, and time.  Massively morbidly obese  HENT:  Head: Normocephalic.  Eyes: No scleral icterus.  Cardiovascular: Normal rate, regular rhythm and normal heart sounds.   Left chest port site well-healed port is palpable.  Pulmonary/Chest: No respiratory distress. She has no wheezes. She has no rales.  Abdominal: Soft.  There is no tenderness.  Musculoskeletal: Normal range of motion.  Walks with a cane.  Lymphadenopathy:    She has no cervical adenopathy.  Neurological: She is alert and oriented to person, place, and time.  Skin: Skin is warm and dry.  Psychiatric: Mood and affect normal.      No results found for this or any previous visit (from the past 48 hour(s)). No results found.  Assessment/Plan:  Patient with breast cancer requesting port removal. She has not used it since 2014.  I'm in agreement the port can be removed under local and aesthetic and monitored anesthesia care. This should be done in the operating room. I discussed with her the rationale for surgery the options of leaving the port in place and the risks of bleeding infection recurrence of need for port and port replacement she understood and agreed to proceed.  Florene Glen, MD, FACS

## 2015-04-16 ENCOUNTER — Telehealth: Payer: Self-pay | Admitting: Surgery

## 2015-04-16 NOTE — Telephone Encounter (Signed)
Pt advised of pre op date/time and sx date. Sx: 05/13/15 with Dr Burt Knack for [facility] Removal Pre op: 05/05/15 @ 9:30am [facility].

## 2015-04-17 ENCOUNTER — Telehealth: Payer: Self-pay | Admitting: Surgery

## 2015-04-17 NOTE — Telephone Encounter (Signed)
No authorization is required for CPT: 61518 per [facility] with [place]. DOS: 05/13/15. REF# [id number]

## 2015-05-05 ENCOUNTER — Encounter
Admission: RE | Admit: 2015-05-05 | Discharge: 2015-05-05 | Disposition: A | Discharge status: Home / Self Care | Payer: Medicare Other | Source: Ambulatory Visit | Attending: Surgery | Admitting: Surgery

## 2015-05-05 DIAGNOSIS — Z01812 Encounter for preprocedural laboratory examination: Secondary | ICD-10-CM | POA: Insufficient documentation

## 2015-05-05 HISTORY — DX: Essential (primary) hypertension: I10

## 2015-05-05 HISTORY — DX: Hypothyroidism, unspecified: E03.9

## 2015-05-05 HISTORY — DX: Polyneuropathy, unspecified: G62.9

## 2015-05-05 HISTORY — DX: Tachycardia, unspecified: R00.0

## 2015-05-05 HISTORY — DX: Gastro-esophageal reflux disease without esophagitis: K21.9

## 2015-05-05 LAB — CBC WITH DIFFERENTIAL/PLATELET
Basophils Absolute: 0 10*3/uL (ref 0–0.1)
Basophils Relative: 0 %
EOS ABS: 0.1 10*3/uL (ref 0–0.7)
EOS PCT: 2 %
HCT: 42 % (ref 35.0–47.0)
Hemoglobin: 14 g/dL (ref 12.0–16.0)
LYMPHS ABS: 3.8 10*3/uL — AB (ref 1.0–3.6)
Lymphocytes Relative: 55 %
MCH: 31.8 pg (ref 26.0–34.0)
MCHC: 33.3 g/dL (ref 32.0–36.0)
MCV: 95.5 fL (ref 80.0–100.0)
MONO ABS: 0.7 10*3/uL (ref 0.2–0.9)
MONOS PCT: 10 %
Neutro Abs: 2.2 10*3/uL (ref 1.4–6.5)
Neutrophils Relative %: 33 %
PLATELETS: 249 10*3/uL (ref 150–440)
RBC: 4.39 MIL/uL (ref 3.80–5.20)
RDW: 15 % — AB (ref 11.5–14.5)
WBC: 6.9 10*3/uL (ref 3.6–11.0)

## 2015-05-05 LAB — BASIC METABOLIC PANEL
Anion gap: 9 (ref 5–15)
BUN: 9 mg/dL (ref 6–20)
CHLORIDE: 103 mmol/L (ref 101–111)
CO2: 29 mmol/L (ref 22–32)
CREATININE: 0.75 mg/dL (ref 0.44–1.00)
Calcium: 8.9 mg/dL (ref 8.9–10.3)
GFR calc Af Amer: >60 mL/min (ref >60)
GFR calc non Af Amer: >60 mL/min (ref >60)
GLUCOSE: 66 mg/dL (ref 65–99)
POTASSIUM: 4.4 mmol/L (ref 3.5–5.1)
SODIUM: 141 mmol/L (ref 135–145)

## 2015-05-05 NOTE — Patient Instructions (Signed)
  Your procedure is scheduled on: Wednesday 05/13/2015 Report to Day Surgery. 2ND FLOOR MEDICAL MALL ENTRANCE To find out your arrival time please call [phone] between 1PM - 3PM on Tuesday 05/12/2015.  Remember: Instructions that are not followed completely may result in serious medical risk, up to and including death, or upon the discretion of your surgeon and anesthesiologist your surgery may need to be rescheduled.    __X__ 1. Do not eat food or drink liquids after midnight. No gum chewing or hard candies.     __X__ 2. No Alcohol for 24 hours before or after surgery.   ____ 3. Bring all medications with you on the day of surgery if instructed.    __X__ 4. Notify your doctor if there is any change in your medical condition     (cold, fever, infections).     Do not wear jewelry, make-up, hairpins, clips or nail polish.  Do not wear lotions, powders, or perfumes.   Do not shave 48 hours prior to surgery. Men may shave face and neck.  Do not bring valuables to the hospital.    [facility] is not responsible for any belongings or valuables.               Contacts, dentures or bridgework may not be worn into surgery.  Leave your suitcase in the car. After surgery it may be brought to your room.  For patients admitted to the hospital, discharge time is determined by your                treatment team.   Patients discharged the day of surgery will not be allowed to drive home.   Please read over the following fact sheets that you were given:   Surgical Site Infection Prevention   __X__ Take these medicines the morning of surgery with A SIP OF WATER:    1. DULOXETINE  2. GABAPENTIN  3. LEVOTHYROXINE  4. METOPROLOL  5. OMEPRAZOLE  6.  ____ Fleet Enema (as directed)   __X__ Use CHG Soap as directed  ____ Use inhalers on the day of surgery  ____ Stop metformin 2 days prior to surgery    ____ Take 1/2 of usual insulin dose the night before surgery and none on the morning of  surgery.   __X__ Stop Coumadin/Plavix/aspirin on AS DIRECTED ON 05/10/2015  __X__ Stop Anti-inflammatories on TODAY (MELOXICAM)   __X__ Stop supplements until after surgery.  VITAMIN B12  __X__ Bring C-Pap to the hospital.

## 2015-05-13 ENCOUNTER — Encounter: Payer: Self-pay | Admitting: *Deleted

## 2015-05-13 ENCOUNTER — Encounter
Admission: RE | Disposition: A | Discharge status: Home / Self Care | Payer: Self-pay | Source: Ambulatory Visit | Attending: Surgery

## 2015-05-13 ENCOUNTER — Ambulatory Visit: Payer: Medicare Other | Admitting: Anesthesiology

## 2015-05-13 ENCOUNTER — Ambulatory Visit
Admission: RE | Admit: 2015-05-13 | Discharge: 2015-05-13 | Disposition: A | Discharge status: Home / Self Care | Payer: Medicare Other | Source: Ambulatory Visit | Attending: Surgery | Admitting: Surgery

## 2015-05-13 DIAGNOSIS — D0590 Unspecified type of carcinoma in situ of unspecified breast: Secondary | ICD-10-CM | POA: Diagnosis not present

## 2015-05-13 DIAGNOSIS — Z79899 Other long term (current) drug therapy: Secondary | ICD-10-CM | POA: Diagnosis not present

## 2015-05-13 DIAGNOSIS — Z853 Personal history of malignant neoplasm of breast: Secondary | ICD-10-CM | POA: Insufficient documentation

## 2015-05-13 DIAGNOSIS — G4730 Sleep apnea, unspecified: Secondary | ICD-10-CM | POA: Insufficient documentation

## 2015-05-13 DIAGNOSIS — E039 Hypothyroidism, unspecified: Secondary | ICD-10-CM | POA: Diagnosis not present

## 2015-05-13 DIAGNOSIS — F329 Major depressive disorder, single episode, unspecified: Secondary | ICD-10-CM | POA: Insufficient documentation

## 2015-05-13 DIAGNOSIS — Z5181 Encounter for therapeutic drug level monitoring: Secondary | ICD-10-CM | POA: Diagnosis not present

## 2015-05-13 DIAGNOSIS — M1990 Unspecified osteoarthritis, unspecified site: Secondary | ICD-10-CM | POA: Insufficient documentation

## 2015-05-13 DIAGNOSIS — I10 Essential (primary) hypertension: Secondary | ICD-10-CM | POA: Diagnosis not present

## 2015-05-13 DIAGNOSIS — Z452 Encounter for adjustment and management of vascular access device: Secondary | ICD-10-CM | POA: Insufficient documentation

## 2015-05-13 HISTORY — PX: PORT-A-CATH REMOVAL: SHX5289

## 2015-05-13 LAB — PROTIME-INR
INR: 1.15
PROTHROMBIN TIME: 14.9 s (ref 11.4–15.0)

## 2015-05-13 SURGERY — REMOVAL PORT-A-CATH
Anesthesia: Monitor Anesthesia Care | Laterality: Left | Wound class: Clean

## 2015-05-13 MED ORDER — BUPIVACAINE-EPINEPHRINE (PF) 0.25% -1:200000 IJ SOLN
INTRAMUSCULAR | Status: AC
  Filled 2015-05-13: qty 30

## 2015-05-13 MED ORDER — HEPARIN SODIUM (PORCINE) 5000 UNIT/ML IJ SOLN
INTRAMUSCULAR | Status: AC
  Administered 2015-05-13: 5000 [IU] via SUBCUTANEOUS
  Filled 2015-05-13: qty 1

## 2015-05-13 MED ORDER — HEPARIN SODIUM (PORCINE) 5000 UNIT/ML IJ SOLN
5000.0000 [IU] | Freq: Once | INTRAMUSCULAR | Status: AC
  Administered 2015-05-13: 5000 [IU] via SUBCUTANEOUS

## 2015-05-13 MED ORDER — LACTATED RINGERS IV SOLN
INTRAVENOUS | Status: DC
  Administered 2015-05-13: 75 mL/h via INTRAVENOUS

## 2015-05-13 MED ORDER — LIDOCAINE HCL (PF) 1 % IJ SOLN
INTRAMUSCULAR | Status: DC | PRN
  Administered 2015-05-13: 16 mL via SUBCUTANEOUS

## 2015-05-13 MED ORDER — ONDANSETRON HCL 4 MG/2ML IJ SOLN: 4.0000 mg | Freq: Once | INTRAMUSCULAR | Status: DC | PRN

## 2015-05-13 MED ORDER — FENTANYL CITRATE (PF) 100 MCG/2ML IJ SOLN
INTRAMUSCULAR | Status: DC | PRN
  Administered 2015-05-13 (×2): 50 ug via INTRAVENOUS

## 2015-05-13 MED ORDER — DEXTROSE 5 % IV SOLN
3.0000 g | INTRAVENOUS | Status: AC
  Administered 2015-05-13: 3 g via INTRAVENOUS
  Filled 2015-05-13: qty 3000

## 2015-05-13 MED ORDER — LIDOCAINE HCL (PF) 1 % IJ SOLN
INTRAMUSCULAR | Status: AC
  Filled 2015-05-13: qty 30

## 2015-05-13 MED ORDER — FENTANYL CITRATE (PF) 100 MCG/2ML IJ SOLN: 25.0000 ug | INTRAMUSCULAR | Status: DC | PRN

## 2015-05-13 MED ORDER — MIDAZOLAM HCL 2 MG/2ML IJ SOLN
INTRAMUSCULAR | Status: DC | PRN
  Administered 2015-05-13: 2 mg via INTRAVENOUS

## 2015-05-13 MED ORDER — BUPIVACAINE-EPINEPHRINE (PF) 0.25% -1:200000 IJ SOLN
INTRAMUSCULAR | Status: DC | PRN
  Administered 2015-05-13: 16 mL

## 2015-05-13 SURGICAL SUPPLY — 26 items
ADHESIVE MASTISOL STRL (MISCELLANEOUS) ×3 IMPLANT
BLADE SURG 15 STRL LF DISP TIS (BLADE) ×1 IMPLANT
BLADE SURG 15 STRL SS (BLADE) ×2
CANISTER SUCT 1200ML W/VALVE (MISCELLANEOUS) ×3 IMPLANT
CHLORAPREP W/TINT 26ML (MISCELLANEOUS) ×3 IMPLANT
CLOSURE WOUND 1/2 X4 (GAUZE/BANDAGES/DRESSINGS) ×1
COVER LIGHT HANDLE STERIS (MISCELLANEOUS) ×6 IMPLANT
DRAPE PED LAPAROTOMY (DRAPES) ×3 IMPLANT
GAUZE SPONGE 4X4 12PLY STRL (GAUZE/BANDAGES/DRESSINGS) ×3 IMPLANT
GLOVE BIO SURGEON STRL SZ8 (GLOVE) ×12 IMPLANT
GOWN STRL REUS W/ TWL LRG LVL3 (GOWN DISPOSABLE) ×2 IMPLANT
GOWN STRL REUS W/TWL LRG LVL3 (GOWN DISPOSABLE) ×4
KIT RM TURNOVER STRD PROC AR (KITS) ×3 IMPLANT
LABEL OR SOLS (LABEL) ×3 IMPLANT
NEEDLE FILTER BLUNT 18X 1/2SAF (NEEDLE)
NEEDLE FILTER BLUNT 18X1 1/2 (NEEDLE) IMPLANT
NEEDLE HYPO 22GX1.5 SAFETY (NEEDLE) ×3 IMPLANT
PAD GROUND ADULT SPLIT (MISCELLANEOUS) ×3 IMPLANT
SPONGE LAP 18X18 5 PK (GAUZE/BANDAGES/DRESSINGS) ×3 IMPLANT
STRIP CLOSURE SKIN 1/2X4 (GAUZE/BANDAGES/DRESSINGS) ×2 IMPLANT
SUT MNCRL AB 4-0 PS2 18 (SUTURE) ×3 IMPLANT
SUT PROLENE 3 0 SH DA (SUTURE) IMPLANT
SUT VIC AB 3-0 SH 27 (SUTURE) ×2
SUT VIC AB 3-0 SH 27X BRD (SUTURE) ×1 IMPLANT
SYR 3ML LL SCALE MARK (SYRINGE) ×3 IMPLANT
SYRINGE 10CC LL (SYRINGE) ×3 IMPLANT

## 2015-05-13 NOTE — Anesthesia Preprocedure Evaluation (Addendum)
Anesthesia Evaluation  Patient identified by MRN, date of birth, ID band Patient awake    Reviewed: Allergy & Precautions, NPO status , Patient's Chart, lab work & pertinent test results, reviewed documented beta blocker date and time   Airway Mallampati: III  TM Distance: >3 FB     Dental  (+) Chipped, Poor Dentition, Dental Advisory Given, Missing   Pulmonary sleep apnea and Continuous Positive Airway Pressure Ventilation ,          Cardiovascular hypertension, Pt. on medications and Pt. on home beta blockers     Neuro/Psych PSYCHIATRIC DISORDERS Depression  Neuromuscular disease    GI/Hepatic GERD-  ,  Endo/Other  Hypothyroidism   Renal/GU      Musculoskeletal  (+) Arthritis -,   Abdominal   Peds  Hematology   Anesthesia Other Findings Will use CPAP tonite and have someone  At home.  Reproductive/Obstetrics                            Anesthesia Physical Anesthesia Plan  ASA: III  Anesthesia Plan: MAC   Post-op Pain Management:    Induction:   Airway Management Planned:   Additional Equipment:   Intra-op Plan:   Post-operative Plan:   Informed Consent: I have reviewed the patients History and Physical, chart, labs and discussed the procedure including the risks, benefits and alternatives for the proposed anesthesia with the patient or authorized representative who has indicated his/her understanding and acceptance.     Plan Discussed with: CRNA  Anesthesia Plan Comments:         Anesthesia Quick Evaluation

## 2015-05-13 NOTE — Op Note (Signed)
  Pre-operative Diagnosis: Breast cancer  Post-operative Diagnosis: Same   Surgeon: Jerrol Banana. Burt Knack, MD FACS  Anesthesia: Local aesthetic and IV sedation  Procedure: Port removal  Procedure Details  The patient was seen again in the Holding Room. The benefits, complications, treatment options, and expected outcomes were discussed with the patient. The risks of bleeding, infection, recurrence of symptoms, failure to resolve symptoms, were reviewed with the patient.   The patient was taken to Operating Room, identified as Suzanne Dixon and the procedure verified.  A Time Out was held and the above information confirmed.  VTE prophylaxis was in place. Appropriate anesthesia was then administered and tolerated well. The chest was prepped with Chloraprep and draped in the sterile fashion. The patient was positioned in the supine position. Then the patient was placed in Trendelenburg position. Patient was prepped and draped in a sterile condition. Local and static was infiltrated in the sitting skin and subcutaneous tissues around the previous incision. At scar was incised and dissection down to the port pocket was performed. The suture was cut and the was elevated and removed pressure was held. And once assuring hemostasis was adequate the use of minimal electrocautery the site was closed with deep sutures of 30 Vicryls followed by 4-0 subcuticular Monocryl. Suzanne Dixon and Steri-Strips were placed as well as sterile dressing.  Patient was taken to the recovery room in stable condition where a postoperative chest film has been ordered.    Findings: Port placed in the subclavian position verified by fluoroscopy. A postoperative chest film has been ordered.  Estimated Blood Loss: Minimal         Drains: None         Specimens: None       Complications: None                  Condition: Stable   Suzanne Dixon E. Burt Knack, MD, FACS

## 2015-05-13 NOTE — Progress Notes (Signed)
Preoperative Review   Patient is met in the preoperative holding area. The history is reviewed in the chart and with the patient. I personally reviewed the options and rationale as well as the risks of this procedure that have been previously discussed with the patient. All questions asked by the patient and/or family were answered to their satisfaction.  Patient agrees to proceed with this procedure at this time.  Brigette Hopfer E Suresh Audi M.D. FACS  

## 2015-05-13 NOTE — Anesthesia Procedure Notes (Signed)
Procedure Name: MAC Date/Time: 05/13/2015 7:25 AM Performed by: Doreen Salvage Pre-anesthesia Checklist: Patient identified, Emergency Drugs available, Suction available and Patient being monitored Patient Re-evaluated:Patient Re-evaluated prior to inductionOxygen Delivery Method: Nasal cannula

## 2015-05-13 NOTE — Transfer of Care (Signed)
Immediate Anesthesia Transfer of Care Note  Patient: Suzanne Dixon  Procedure(s) Performed: Procedure(s): REMOVAL PORT-A-CATH LEFT CHEST  (Left)  Patient Location: PACU  Anesthesia Type:General  Level of Consciousness: sedated  Airway & Oxygen Therapy: Patient Spontanous Breathing and Patient connected to face mask oxygen  Post-op Assessment: Report given to RN and Post -op Vital signs reviewed and stable  Post vital signs: Reviewed and stable  Last Vitals:  Filed Vitals:   05/13/15 0807  BP: 152/89  Pulse: 65  Temp: 37.5 C  Resp: 16    Complications: No apparent anesthesia complications

## 2015-05-13 NOTE — Anesthesia Postprocedure Evaluation (Signed)
  Anesthesia Post-op Note  Patient: Suzanne Dixon  Procedure(s) Performed: Procedure(s): REMOVAL PORT-A-CATH LEFT CHEST  (Left)  Anesthesia type:MAC  Patient location: PACU  Post pain: Pain level controlled  Post assessment: Post-op Vital signs reviewed, Patient's Cardiovascular Status Stable, Respiratory Function Stable, Patent Airway and No signs of Nausea or vomiting  Post vital signs: Reviewed and stable  Last Vitals:  Filed Vitals:   05/13/15 0924  BP: 166/79  Pulse: 80  Temp:   Resp:     Level of consciousness: awake, alert  and patient cooperative  Complications: No apparent anesthesia complications

## 2015-05-13 NOTE — Discharge Instructions (Addendum)
Remove dressing in 24 hours. May shower in 24 hours. Leave paper strips in place. Resume all home medications. Follow-up with Dr. Burt Knack in 10 days.AMBULATORY SURGERY  DISCHARGE INSTRUCTIONS   1) The drugs that you were given will stay in your system until tomorrow so for the next 24 hours you should not:  A) Drive an automobile B) Make any legal decisions C) Drink any alcoholic beverage   2) You may resume regular meals tomorrow.  Today it is better to start with liquids and gradually work up to solid foods.  You may eat anything you prefer, but it is better to start with liquids, then soup and crackers, and gradually work up to solid foods.   3) Please notify your doctor immediately if you have any unusual bleeding, trouble breathing, redness and pain at the surgery site, drainage, fever, or pain not relieved by medication.    4) Additional Instructions:        Please contact your physician with any problems or Same Day Surgery at [phone], Monday through Friday 6 am to 4 pm, or [place] at [facility] number at [phone].AMBULATORY SURGERY  DISCHARGE INSTRUCTIONS   5) The drugs that you were given will stay in your system until tomorrow so for the next 24 hours you should not:  D) Drive an automobile E) Make any legal decisions F) Drink any alcoholic beverage   6) You may resume regular meals tomorrow.  Today it is better to start with liquids and gradually work up to solid foods.  You may eat anything you prefer, but it is better to start with liquids, then soup and crackers, and gradually work up to solid foods.   7) Please notify your doctor immediately if you have any unusual bleeding, trouble breathing, redness and pain at the surgery site, drainage, fever, or pain not relieved by medication.    8) Additional Instructions:        Please contact your physician with any problems or Same Day Surgery at [phone], Monday through Friday 6  am to 4 pm, or [place] at [facility] number at [phone].

## 2015-05-14 ENCOUNTER — Encounter: Payer: Self-pay | Admitting: Surgery

## 2015-05-22 ENCOUNTER — Encounter: Payer: Medicare Other | Admitting: Surgery

## 2015-05-22 ENCOUNTER — Encounter: Payer: Self-pay | Admitting: Surgery

## 2015-05-22 ENCOUNTER — Ambulatory Visit (INDEPENDENT_AMBULATORY_CARE_PROVIDER_SITE_OTHER): Payer: Medicare Other | Admitting: Surgery

## 2015-05-22 VITALS — BP 130/88 | HR 101 | Temp 97.5°F | Ht 66.0 in | Wt 337.0 lb

## 2015-05-22 DIAGNOSIS — D0590 Unspecified type of carcinoma in situ of unspecified breast: Secondary | ICD-10-CM

## 2015-05-22 NOTE — Patient Instructions (Signed)
Please give us a call if you have any questions or concerns. 

## 2015-05-22 NOTE — Progress Notes (Signed)
Patient is status post port removal she has no complaints at this time.   Wound is healing well no erythema the incision was made through the old incision which was heavily keloid. Otherwise the wound is doing quite well.  Patient is doing well recommend follow up on an as-needed basis.

## 2015-06-22 ENCOUNTER — Ambulatory Visit: Payer: Medicare Other | Admitting: Anesthesiology

## 2015-06-22 ENCOUNTER — Encounter: Payer: Self-pay | Admitting: *Deleted

## 2015-06-22 ENCOUNTER — Encounter
Admission: RE | Disposition: A | Discharge status: Home / Self Care | Payer: Self-pay | Source: Ambulatory Visit | Attending: Unknown Physician Specialty

## 2015-06-22 ENCOUNTER — Ambulatory Visit
Admission: RE | Admit: 2015-06-22 | Discharge: 2015-06-22 | Disposition: A | Discharge status: Home / Self Care | Payer: Medicare Other | Source: Ambulatory Visit | Attending: Unknown Physician Specialty | Admitting: Unknown Physician Specialty

## 2015-06-22 DIAGNOSIS — Z807 Family history of other malignant neoplasms of lymphoid, hematopoietic and related tissues: Secondary | ICD-10-CM | POA: Diagnosis not present

## 2015-06-22 DIAGNOSIS — Z8249 Family history of ischemic heart disease and other diseases of the circulatory system: Secondary | ICD-10-CM | POA: Diagnosis not present

## 2015-06-22 DIAGNOSIS — G629 Polyneuropathy, unspecified: Secondary | ICD-10-CM | POA: Diagnosis not present

## 2015-06-22 DIAGNOSIS — E039 Hypothyroidism, unspecified: Secondary | ICD-10-CM | POA: Insufficient documentation

## 2015-06-22 DIAGNOSIS — Z9010 Acquired absence of unspecified breast and nipple: Secondary | ICD-10-CM | POA: Insufficient documentation

## 2015-06-22 DIAGNOSIS — Z882 Allergy status to sulfonamides status: Secondary | ICD-10-CM | POA: Insufficient documentation

## 2015-06-22 DIAGNOSIS — K640 First degree hemorrhoids: Secondary | ICD-10-CM | POA: Insufficient documentation

## 2015-06-22 DIAGNOSIS — K219 Gastro-esophageal reflux disease without esophagitis: Secondary | ICD-10-CM | POA: Diagnosis not present

## 2015-06-22 DIAGNOSIS — Z853 Personal history of malignant neoplasm of breast: Secondary | ICD-10-CM | POA: Insufficient documentation

## 2015-06-22 DIAGNOSIS — Z9889 Other specified postprocedural states: Secondary | ICD-10-CM | POA: Insufficient documentation

## 2015-06-22 DIAGNOSIS — Z79899 Other long term (current) drug therapy: Secondary | ICD-10-CM | POA: Insufficient documentation

## 2015-06-22 DIAGNOSIS — Z803 Family history of malignant neoplasm of breast: Secondary | ICD-10-CM | POA: Diagnosis not present

## 2015-06-22 DIAGNOSIS — I10 Essential (primary) hypertension: Secondary | ICD-10-CM | POA: Insufficient documentation

## 2015-06-22 DIAGNOSIS — K5730 Diverticulosis of large intestine without perforation or abscess without bleeding: Secondary | ICD-10-CM | POA: Diagnosis not present

## 2015-06-22 DIAGNOSIS — Z1211 Encounter for screening for malignant neoplasm of colon: Secondary | ICD-10-CM | POA: Insufficient documentation

## 2015-06-22 DIAGNOSIS — Z7951 Long term (current) use of inhaled steroids: Secondary | ICD-10-CM | POA: Diagnosis not present

## 2015-06-22 DIAGNOSIS — Z7901 Long term (current) use of anticoagulants: Secondary | ICD-10-CM | POA: Insufficient documentation

## 2015-06-22 DIAGNOSIS — Z888 Allergy status to other drugs, medicaments and biological substances status: Secondary | ICD-10-CM | POA: Insufficient documentation

## 2015-06-22 HISTORY — PX: COLONOSCOPY WITH PROPOFOL: SHX5780

## 2015-06-22 SURGERY — COLONOSCOPY WITH PROPOFOL
Anesthesia: General

## 2015-06-22 MED ORDER — SODIUM CHLORIDE 0.9 % IV SOLN
INTRAVENOUS | Status: DC
  Administered 2015-06-22: 15:00:00 via INTRAVENOUS

## 2015-06-22 MED ORDER — PROPOFOL 500 MG/50ML IV EMUL
INTRAVENOUS | Status: DC | PRN
  Administered 2015-06-22: 160 ug/kg/min via INTRAVENOUS

## 2015-06-22 MED ORDER — MIDAZOLAM HCL 2 MG/2ML IJ SOLN
INTRAMUSCULAR | Status: DC | PRN
  Administered 2015-06-22: 1 mg via INTRAVENOUS

## 2015-06-22 MED ORDER — PROPOFOL 10 MG/ML IV BOLUS
INTRAVENOUS | Status: DC | PRN
  Administered 2015-06-22: 50 mg via INTRAVENOUS

## 2015-06-22 MED ORDER — LIDOCAINE HCL (CARDIAC) 20 MG/ML IV SOLN
INTRAVENOUS | Status: DC | PRN
  Administered 2015-06-22: 60 mg via INTRAVENOUS

## 2015-06-22 NOTE — Op Note (Signed)
[facility] Patient Name: Suzanne Dixon Procedure Date: 06/22/2015 3:46 PM MRN: [id number] Account #: [id number] Date of Birth: 1950/09/26 Admit Type: Outpatient Age: 63 Room: [facility] ENDO ROOM 1 Gender: Female Note Status: Finalized Procedure:         Colonoscopy Indications:       Screening for colorectal malignant neoplasm Providers:         Manya Silvas, MD Referring MD:      Ramonita Lab, MD (Referring MD) Medicines:         Propofol per Anesthesia Complications:     No immediate complications. Procedure:         Pre-Anesthesia Assessment:                    - After reviewing the risks and benefits, the patient was                     deemed in satisfactory condition to undergo the procedure.                    After obtaining informed consent, the colonoscope was                     passed under direct vision. Throughout the procedure, the                     patient's blood pressure, pulse, and oxygen saturations                     were monitored continuously. The Colonoscope was                     introduced through the anus and advanced to the the cecum,                     identified by appendiceal orifice and ileocecal valve. The                     colonoscopy was performed without difficulty. The patient                     tolerated the procedure well. The quality of the bowel                     preparation was good. Findings:      Many small-mouthed diverticula were found in the sigmoid colon.      Internal hemorrhoids were found during endoscopy. The hemorrhoids were       small, medium-sized and Grade I (internal hemorrhoids that do not       prolapse).      The exam was otherwise without abnormality. Impression:        - Diverticulosis in the sigmoid colon.                    - Internal hemorrhoids.                    - The examination was otherwise normal.                    - No specimens collected. Recommendation:    -  Repeat colonoscopy in 10 years for screening purposes. Manya Silvas, MD 06/22/2015 4:07:36 PM This report has been signed electronically. Number of Addenda: 0 Note Initiated On: 06/22/2015 3:46  PM Scope Withdrawal Time: 0 hours 5 minutes 43 seconds  Total Procedure Duration: 0 hours 12 minutes 25 seconds       [facility]

## 2015-06-22 NOTE — H&P (Signed)
Primary Care Physician:  Adin Hector, MD Primary Gastroenterologist:  Dr. Vira Agar  Pre-Procedure History & Physical: HPI:  Suzanne Dixon is a 64 y.o. female is here for an colonoscopy.   Past Medical History  Diagnosis Date  . H/O: cesarean section 1987    per patient report  . H/O: hysterectomy 1996    per patient  . Ruptured cervical disc 2002    per patient   . H/O mastectomy 2014    per patient  . Breast cancer ([place]) 04/01/2015  . Hypertension   . Tachycardia   . Hypothyroidism   . Neuropathy ([place])   . GERD (gastroesophageal reflux disease)     Past Surgical History  Procedure Laterality Date  . Abdominal hysterectomy    . Mastectomy Right   . Cesarean section    . Cholecystectomy    . Breast surgery Right 2014    mastectomy  . Back surgery  2002    plate and 2 screws in neck  . Port-a-cath removal Left 05/13/2015    Procedure: REMOVAL PORT-A-CATH LEFT CHEST ;  Surgeon: Florene Glen, MD;  Location: [facility] ORS;  Service: General;  Laterality: Left;    Prior to Admission medications   Medication Sig Start Date End Date Taking? Authorizing Provider  allopurinol (ZYLOPRIM) 300 MG tablet Take 300 mg by mouth 2 (two) times daily.   Yes Historical Provider, MD  Calcium Carb-Cholecalciferol (SM CALCIUM-VITAMIN D) 600-400 MG-UNIT TABS Take 1 tablet by mouth 2 (two) times daily.    Yes Historical Provider, MD  Cyanocobalamin (RA VITAMIN B-12 TR) 1000 MCG TBCR Take 1 tablet by mouth daily.    Yes Historical Provider, MD  DULoxetine (CYMBALTA) 30 MG capsule Take 30 mg by mouth daily.   Yes Historical Provider, MD  fluticasone (FLONASE) 50 MCG/ACT nasal spray Place 2 sprays into both nostrils daily.   Yes Historical Provider, MD  gabapentin (NEURONTIN) 300 MG capsule Take 300 mg by mouth 3 (three) times daily.   Yes Historical Provider, MD  gabapentin (NEURONTIN) 600 MG tablet Take 600 mg by mouth 3 (three) times daily. 03/09/15  Yes Historical Provider, MD   levothyroxine (SYNTHROID, LEVOTHROID) 50 MCG tablet Take 50 mcg by mouth daily before breakfast.   Yes Historical Provider, MD  meloxicam (MOBIC) 15 MG tablet Take 15 mg by mouth daily.   Yes Historical Provider, MD  metoprolol (LOPRESSOR) 100 MG tablet Take 200 mg by mouth 2 (two) times daily.    Yes Historical Provider, MD  Multiple Vitamins tablet Take 2 tablets by mouth daily.    Yes Historical Provider, MD  omeprazole (PRILOSEC) 20 MG capsule Take 20 mg by mouth 2 (two) times daily before a meal.    Yes Historical Provider, MD  potassium chloride (K-DUR,KLOR-CON) 10 MEQ tablet Take 10 mEq by mouth 3 (three) times daily.    Yes Historical Provider, MD  warfarin (COUMADIN) 2 MG tablet Take 2 mg by mouth daily at 6 PM. Taken with 5mg  tablet for a total of 7mg    Yes Historical Provider, MD  warfarin (COUMADIN) 5 MG tablet Take 5 mg by mouth daily at 6 PM. Taken with 2mg  tablet for a total of 7mg  03/31/15  Yes Historical Provider, MD  azelastine (ASTELIN) 0.1 % nasal spray Place 2 sprays into the nose 2 (two) times daily as needed for rhinitis.  02/19/15 02/19/16  Historical Provider, MD  traMADol (ULTRAM) 50 MG tablet Take 50 mg by mouth every 6 (six)  hours as needed.    Historical Provider, MD  triamcinolone cream (KENALOG) 0.1 % Apply 1 application topically daily.  11/28/14 11/28/15  Historical Provider, MD    Allergies as of 05/18/2015 - Review Complete 05/13/2015  Allergen Reaction Noted  . Singulair [montelukast]  01/19/2015  . Sulfa antibiotics Hives 01/19/2015  . Venlafaxine  01/19/2015    Family History  Problem Relation Age of Onset  . Hypertension Mother   . Lymphoma Mother   . Cancer Sister     Breast    Social History   Social History  . Marital Status: Divorced    Spouse Name: N/A  . Number of Children: N/A  . Years of Education: N/A   Occupational History  . Not on file.   Social History Main Topics  . Smoking status: Never Smoker   . Smokeless tobacco: Never Used   . Alcohol Use: No  . Drug Use: No  . Sexual Activity: Not on file   Other Topics Concern  . Not on file   Social History Narrative    Review of Systems: See HPI, otherwise negative ROS  Physical Exam: BP 153/86 mmHg  Pulse 73  Temp(Src) 97.5 F (36.4 C) (Tympanic)  Resp 14  Ht 5\' 5"  (1.651 m)  Wt 151.955 kg (335 lb)  BMI 55.75 kg/m2  SpO2 100% General:   Alert,  pleasant and cooperative in NAD Head:  Normocephalic and atraumatic. Neck:  Supple; no masses or thyromegaly. Lungs:  Clear throughout to auscultation.    Heart:  Regular rate and rhythm. Abdomen:  Soft, nontender and nondistended. Normal bowel sounds, without guarding, and without rebound.   Neurologic:  Alert and  oriented x4;  grossly normal neurologically.  Impression/Plan: Suzanne Dixon is here for an colonoscopy to be performed for screening  Risks, benefits, limitations, and alternatives regarding  colonoscopy have been reviewed with the patient.  Questions have been answered.  All parties agreeable.   Gaylyn Cheers, MD  06/22/2015, 3:45 PM

## 2015-06-22 NOTE — Transfer of Care (Signed)
Immediate Anesthesia Transfer of Care Note  Patient: Suzanne Dixon  Procedure(s) Performed: Procedure(s): COLONOSCOPY WITH PROPOFOL (N/A)  Patient Location: Endoscopy Unit  Anesthesia Type:General  Level of Consciousness: awake, alert , oriented and patient cooperative  Airway & Oxygen Therapy: Patient Spontanous Breathing and Patient connected to nasal cannula oxygen  Post-op Assessment: Report given to RN, Post -op Vital signs reviewed and stable and Patient moving all extremities X 4  Post vital signs: Reviewed and stable  Last Vitals:  Filed Vitals:   06/22/15 1609  BP: 124/54  Pulse: 68  Temp: 36.2 C  Resp: 22    Complications: No apparent anesthesia complications

## 2015-06-22 NOTE — Anesthesia Preprocedure Evaluation (Signed)
Anesthesia Evaluation  Patient identified by MRN, date of birth, ID band Patient awake    Reviewed: Allergy & Precautions, H&P , NPO status , Patient's Chart, lab work & pertinent test results, reviewed documented beta blocker date and time   Airway Mallampati: II  TM Distance: >3 FB Neck ROM: full    Dental no notable dental hx.    Pulmonary neg pulmonary ROS, sleep apnea ,    Pulmonary exam normal breath sounds clear to auscultation       Cardiovascular Exercise Tolerance: Good hypertension, negative cardio ROS   Rhythm:regular Rate:Normal     Neuro/Psych PSYCHIATRIC DISORDERS Complains of bilateral upper extr neuropathy, worse after placement of iv.  Burning in both arms and hands  Neuromuscular disease negative neurological ROS  negative psych ROS   GI/Hepatic negative GI ROS, Neg liver ROS, GERD  ,  Endo/Other  negative endocrine ROSHypothyroidism Morbid obesity  Renal/GU negative Renal ROS  negative genitourinary   Musculoskeletal   Abdominal   Peds  Hematology negative hematology ROS (+)   Anesthesia Other Findings   Reproductive/Obstetrics negative OB ROS                             Anesthesia Physical Anesthesia Plan  ASA: III  Anesthesia Plan: General   Post-op Pain Management:    Induction:   Airway Management Planned:   Additional Equipment:   Intra-op Plan:   Post-operative Plan:   Informed Consent: I have reviewed the patients History and Physical, chart, labs and discussed the procedure including the risks, benefits and alternatives for the proposed anesthesia with the patient or authorized representative who has indicated his/her understanding and acceptance.   Dental Advisory Given  Plan Discussed with: CRNA  Anesthesia Plan Comments:         Anesthesia Quick Evaluation

## 2015-06-23 ENCOUNTER — Encounter: Payer: Self-pay | Admitting: Unknown Physician Specialty

## 2015-06-26 NOTE — Anesthesia Postprocedure Evaluation (Signed)
  Anesthesia Post-op Note  Patient: Suzanne Dixon  Procedure(s) Performed: Procedure(s): COLONOSCOPY WITH PROPOFOL (N/A)  Anesthesia type:General  Patient location: PACU  Post pain: Pain level controlled  Post assessment: Post-op Vital signs reviewed, Patient's Cardiovascular Status Stable, Respiratory Function Stable, Patent Airway and No signs of Nausea or vomiting  Post vital signs: Reviewed and stable  Last Vitals:  Filed Vitals:   06/22/15 1650  BP: 174/85  Pulse: 62  Temp:   Resp: 23    Level of consciousness: awake, alert  and patient cooperative  Complications: No apparent anesthesia complications

## 2015-08-27 ENCOUNTER — Other Ambulatory Visit: Payer: Self-pay | Admitting: Internal Medicine

## 2015-08-27 DIAGNOSIS — M7989 Other specified soft tissue disorders: Secondary | ICD-10-CM

## 2015-08-28 ENCOUNTER — Ambulatory Visit
Admission: RE | Admit: 2015-08-28 | Discharge: 2015-08-28 | Disposition: A | Discharge status: Home / Self Care | Payer: Medicare Other | Source: Ambulatory Visit | Attending: Internal Medicine | Admitting: Internal Medicine

## 2015-08-28 DIAGNOSIS — M7989 Other specified soft tissue disorders: Secondary | ICD-10-CM | POA: Insufficient documentation

## 2015-10-01 ENCOUNTER — Other Ambulatory Visit: Payer: Self-pay | Admitting: *Deleted

## 2015-10-01 DIAGNOSIS — C50911 Malignant neoplasm of unspecified site of right female breast: Secondary | ICD-10-CM

## 2015-10-02 ENCOUNTER — Inpatient Hospital Stay: Payer: Medicare HMO | Attending: Internal Medicine

## 2015-10-02 ENCOUNTER — Inpatient Hospital Stay (HOSPITAL_BASED_OUTPATIENT_CLINIC_OR_DEPARTMENT_OTHER): Payer: Medicare HMO | Admitting: Internal Medicine

## 2015-10-02 VITALS — BP 142/88 | HR 73 | Temp 97.8°F | Resp 20 | Ht 65.0 in | Wt 332.2 lb

## 2015-10-02 DIAGNOSIS — Z853 Personal history of malignant neoplasm of breast: Secondary | ICD-10-CM | POA: Insufficient documentation

## 2015-10-02 DIAGNOSIS — E039 Hypothyroidism, unspecified: Secondary | ICD-10-CM | POA: Insufficient documentation

## 2015-10-02 DIAGNOSIS — I10 Essential (primary) hypertension: Secondary | ICD-10-CM | POA: Insufficient documentation

## 2015-10-02 DIAGNOSIS — Z171 Estrogen receptor negative status [ER-]: Secondary | ICD-10-CM | POA: Diagnosis not present

## 2015-10-02 DIAGNOSIS — Z79899 Other long term (current) drug therapy: Secondary | ICD-10-CM | POA: Insufficient documentation

## 2015-10-02 DIAGNOSIS — R918 Other nonspecific abnormal finding of lung field: Secondary | ICD-10-CM | POA: Diagnosis not present

## 2015-10-02 DIAGNOSIS — Z9221 Personal history of antineoplastic chemotherapy: Secondary | ICD-10-CM

## 2015-10-02 DIAGNOSIS — Z803 Family history of malignant neoplasm of breast: Secondary | ICD-10-CM | POA: Insufficient documentation

## 2015-10-02 DIAGNOSIS — C50911 Malignant neoplasm of unspecified site of right female breast: Secondary | ICD-10-CM

## 2015-10-02 DIAGNOSIS — I972 Postmastectomy lymphedema syndrome: Secondary | ICD-10-CM | POA: Insufficient documentation

## 2015-10-02 DIAGNOSIS — K219 Gastro-esophageal reflux disease without esophagitis: Secondary | ICD-10-CM | POA: Diagnosis not present

## 2015-10-02 DIAGNOSIS — D72820 Lymphocytosis (symptomatic): Secondary | ICD-10-CM

## 2015-10-02 DIAGNOSIS — Z9011 Acquired absence of right breast and nipple: Secondary | ICD-10-CM | POA: Diagnosis not present

## 2015-10-02 DIAGNOSIS — Z6841 Body Mass Index (BMI) 40.0 and over, adult: Secondary | ICD-10-CM | POA: Insufficient documentation

## 2015-10-02 DIAGNOSIS — Z90710 Acquired absence of both cervix and uterus: Secondary | ICD-10-CM | POA: Diagnosis not present

## 2015-10-02 DIAGNOSIS — Z7901 Long term (current) use of anticoagulants: Secondary | ICD-10-CM | POA: Insufficient documentation

## 2015-10-02 LAB — CBC WITH DIFFERENTIAL/PLATELET
Basophils Absolute: 0.1 10*3/uL (ref 0–0.1)
Basophils Relative: 1 %
EOS PCT: 1 %
Eosinophils Absolute: 0.1 10*3/uL (ref 0–0.7)
HEMATOCRIT: 40.5 % (ref 35.0–47.0)
Hemoglobin: 13.8 g/dL (ref 12.0–16.0)
LYMPHS ABS: 3.9 10*3/uL — AB (ref 1.0–3.6)
Lymphocytes Relative: 41 %
MCH: 31.7 pg (ref 26.0–34.0)
MCHC: 34 g/dL (ref 32.0–36.0)
MCV: 93 fL (ref 80.0–100.0)
MONO ABS: 1 10*3/uL — AB (ref 0.2–0.9)
Monocytes Relative: 11 %
NEUTROS PCT: 46 %
Neutro Abs: 4.4 10*3/uL (ref 1.4–6.5)
PLATELETS: 260 10*3/uL (ref 150–440)
RBC: 4.36 MIL/uL (ref 3.80–5.20)
RDW: 14.5 % (ref 11.5–14.5)
WBC: 9.5 10*3/uL (ref 3.6–11.0)

## 2015-10-02 LAB — FERRITIN: Ferritin: 56 ng/mL (ref 11–307)

## 2015-10-02 LAB — IRON AND TIBC
Iron: 102 ug/dL (ref 28–170)
SATURATION RATIOS: 31 % (ref 10.4–31.8)
TIBC: 326 ug/dL (ref 250–450)
UIBC: 224 ug/dL

## 2015-10-02 NOTE — Progress Notes (Signed)
[facility]  [phone]  [phone]     Suzanne Dixon DOB: 05/15/1951  MR#: [id number]  GUY#:[id number]  Patient Care Team: Adin Hector, MD as PCP - General (Internal Medicine)  CHIEF COMPLAINT:  No chief complaint on file.  Patient also with medical history of mild lymphocytosis with a CLL panel revealing abnormal T-cell antigens and T-cell clonal gene rearrangement. HIV, SPEP and CT/PET for 2012 reported as negative. Breast cancer diagnosed in 2014, ER/PR negative HER-2/neu negative, BRCA negative. Patient is status post right mastectomy following diagnosis of infiltrating ductal carcinoma. Patient has subsequent right arm lymphedema. Patient also has significant history for benign pulmonary nodules noted on CT scan from 2013, that have remained stable. Last CT March 2015.  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 Patient completed 6 cycles of FEC in 2014. She continues to have skin lesions/boils on his skin from time to time. She has not had any significant health-related issues or concerns since her last appointment. She continues to have difficulties performing activities of daily living due to morbid obesity.  REVIEW OF SYSTEMS:   Review of Systems  Constitutional: Negative for fever, chills, weight loss, malaise/fatigue and diaphoresis.  HENT: Negative for congestion, ear discharge, ear pain, hearing loss, nosebleeds, sore throat and tinnitus.   Eyes: Negative for blurred vision, double vision, photophobia, pain, discharge and redness.  Respiratory: Negative for cough, hemoptysis, sputum production, shortness of breath, wheezing and stridor.   Cardiovascular: Negative for chest pain, palpitations, orthopnea, claudication, leg swelling and PND.  Gastrointestinal: Negative for heartburn, nausea, vomiting, abdominal pain,  diarrhea, constipation, blood in stool and melena.  Genitourinary: Negative.   Musculoskeletal: Negative.   Skin: Negative.   Neurological: Negative for dizziness, tingling, focal weakness, seizures, weakness and headaches.  Endo/Heme/Allergies: Bruises/bleeds easily.  Psychiatric/Behavioral: Negative for depression. The patient is not nervous/anxious and does not have insomnia.     As per HPI. Otherwise, a complete review of systems is negatve.  ONCOLOGY HISTORY:   Breast cancer ([place])   10/05/2012 Initial Diagnosis Breast cancer   10/10/2012 Surgery Right Mastectomy   02/18/2013 - 06/03/2013 Chemotherapy FECx 6 cycles    PAST MEDICAL HISTORY: Past Medical History  Diagnosis Date  . H/O: cesarean section 1987    per patient report  . H/O: hysterectomy 1996    per patient  . Ruptured cervical disc 2002    per patient   . H/O mastectomy 2014    per patient  . Breast cancer ([place]) 04/01/2015  . Hypertension   . Tachycardia   . Hypothyroidism   . Neuropathy ([place])   . GERD (gastroesophageal reflux disease)     PAST SURGICAL HISTORY: Past Surgical History  Procedure Laterality Date  . Abdominal hysterectomy    . Mastectomy Right   . Cesarean section    . Cholecystectomy    . Breast surgery Right 2014    mastectomy  . Back surgery  2002    plate and 2 screws in neck  . Port-a-cath removal Left 05/13/2015    Procedure: REMOVAL PORT-A-CATH LEFT CHEST ;  Surgeon: Florene Glen, MD;  Location: [facility] ORS;  Service: General;  Laterality: Left;  . Colonoscopy with propofol N/A 06/22/2015    Procedure: COLONOSCOPY WITH PROPOFOL;  Surgeon: Manya Silvas, MD;  Location: [facility] ENDOSCOPY;  Service: Endoscopy;  Laterality: N/A;    FAMILY HISTORY Family History  Problem Relation Age of Onset  . Hypertension Mother   . Lymphoma Mother   . Cancer Sister     Breast    GYNECOLOGIC HISTORY:  No LMP recorded. Patient has had a hysterectomy.     ADVANCED DIRECTIVES:    HEALTH  MAINTENANCE: Social History  Substance Use Topics  . Smoking status: Never Smoker   . Smokeless tobacco: Never Used  . Alcohol Use: No     Colonoscopy:  PAP:  Bone density:  Lipid panel:  Allergies  Allergen Reactions  . Advair Diskus [Fluticasone-Salmeterol]   . Singulair [Montelukast]     "muscle pain"  . Sulfa Antibiotics Hives  . Venlafaxine     "altered mental status/tremors"    Current Outpatient Prescriptions  Medication Sig Dispense Refill  . allopurinol (ZYLOPRIM) 300 MG tablet Take 300 mg by mouth 2 (two) times daily.    Marland Kitchen azelastine (ASTELIN) 0.1 % nasal spray Place 2 sprays into the nose 2 (two) times daily as needed for rhinitis.     . Calcium Carb-Cholecalciferol (SM CALCIUM-VITAMIN D) 600-400 MG-UNIT TABS Take 1 tablet by mouth 2 (two) times daily.     . Cyanocobalamin (RA VITAMIN B-12 TR) 1000 MCG TBCR Take 1 tablet by mouth daily.     . DULoxetine (CYMBALTA) 30 MG capsule Take 30 mg by mouth daily.    . fluticasone (FLONASE) 50 MCG/ACT nasal spray Place 2 sprays into both nostrils daily.    Marland Kitchen gabapentin (NEURONTIN) 300 MG capsule Take 300 mg by mouth 3 (three) times daily.    Marland Kitchen gabapentin (NEURONTIN) 600 MG tablet Take 600 mg by mouth 3 (three) times daily.  11  . levothyroxine (SYNTHROID, LEVOTHROID) 50 MCG tablet Take 50 mcg by mouth daily before breakfast.    . meloxicam (MOBIC) 15 MG tablet Take 15 mg by mouth daily.    . metoprolol (LOPRESSOR) 100 MG tablet Take 200 mg by mouth 2 (two) times daily.     . Multiple Vitamins tablet Take 2 tablets by mouth daily.     Marland Kitchen omeprazole (PRILOSEC) 20 MG capsule Take 20 mg by mouth 2 (two) times daily before a meal.     . potassium chloride (K-DUR,KLOR-CON) 10 MEQ tablet Take 10 mEq by mouth 3 (three) times daily.     . traMADol (ULTRAM) 50 MG tablet Take 50 mg by mouth every 6 (six) hours as needed.    . triamcinolone cream (KENALOG) 0.1 % Apply 1 application topically daily.     Marland Kitchen warfarin (COUMADIN) 2 MG tablet  Take 2 mg by mouth daily at 6 PM. Taken with 380m tablet for a total of 78m   . warfarin (COUMADIN) 5 MG tablet Take 5 mg by mouth daily at 6 PM. Taken with 80m68mablet for a total of 7mg59m  No current facility-administered medications for this visit.    OBJECTIVE: BP 142/88 mmHg  Pulse 73  Temp(Src) 97.8 F (36.6 C) (Tympanic)  Resp 20  Ht '5\' 5"'  (1.651 m)  Wt 332 lb 3.7 oz (150.7 kg)  BMI 55.29 kg/m2   Body mass index is 55.29 kg/(m^2).    ECOG FS:1 - Symptomatic but completely ambulatory  General: Well-developed, morbidly obese African-American female, no acute distress. Eyes: Pink conjunctiva, anicteric sclera. HEENT: Normocephalic, moist mucous membranes, clear oropharnyx. Lungs: Clear to auscultation bilaterally. Heart: Regular rate and rhythm. No rubs, murmurs, or gallops. Abdomen: Soft, nontender, nondistended. No organomegaly noted, normoactive bowel sounds. Musculoskeletal: No edema, cyanosis,  or clubbing. Neuro: Alert, answering all questions appropriately. Cranial nerves grossly intact. Skin: multiple hyperpigmented areas at the sites of former infection. Psych: Normal affect. Lymphatics: No cervical, calvicular, axillary or inguinal LAD. Breast: Right-status post mastectomy. No evidence of masses or nodules. No axillary lymphadenopathy. Left-no masses or palpable nodules. No axillary lymphadenopathy. There are 2 hyperpigmented lesions at the side of former infection.   LAB RESULTS:  Appointment on 10/02/2015  Component Date Value Ref Range Status  . WBC 10/02/2015 9.5  3.6 - 11.0 K/uL Final  . RBC 10/02/2015 4.36  3.80 - 5.20 MIL/uL Final  . Hemoglobin 10/02/2015 13.8  12.0 - 16.0 g/dL Final  . HCT 10/02/2015 40.5  35.0 - 47.0 % Final  . MCV 10/02/2015 93.0  80.0 - 100.0 fL Final  . MCH 10/02/2015 31.7  26.0 - 34.0 pg Final  . MCHC 10/02/2015 34.0  32.0 - 36.0 g/dL Final  . RDW 10/02/2015 14.5  11.5 - 14.5 % Final  . Platelets 10/02/2015 260  150 - 440 K/uL  Final  . Neutrophils Relative % 10/02/2015 46   Final  . Neutro Abs 10/02/2015 4.4  1.4 - 6.5 K/uL Final  . Lymphocytes Relative 10/02/2015 41   Final  . Lymphs Abs 10/02/2015 3.9* 1.0 - 3.6 K/uL Final  . Monocytes Relative 10/02/2015 11   Final  . Monocytes Absolute 10/02/2015 1.0* 0.2 - 0.9 K/uL Final  . Eosinophils Relative 10/02/2015 1   Final  . Eosinophils Absolute 10/02/2015 0.1  0 - 0.7 K/uL Final  . Basophils Relative 10/02/2015 1   Final  . Basophils Absolute 10/02/2015 0.1  0 - 0.1 K/uL Final    STUDIES: No results found.  ASSESSMENT:  Breast cancer  PLAN:  1. Breast CA. 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 Patient completed 6 cycles of FEC chemotherapy in 05/2013.  Since she is more than 2 years out from the completion of the treatment, we will switch to annual visits. She will continue to perform monthly self exam of the left breast and right chest wall. She will continue with annual mammograms, the next one is due in March 2017. She will contact us prior to next visit if needed.  2. 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 We will request T-cell receptor gene rearrangement test today. If positive, the diagnosis is consistent with LGL process. If negative, it'll be a reactive process. Patient expressed understanding and was in agreement with this plan. She also understands that She can call clinic at any time with any questions, concerns, or complaints.     Roxana Hires, MD   10/02/2015 1:34 PM

## 2015-10-07 ENCOUNTER — Inpatient Hospital Stay: Payer: Medicare HMO

## 2015-10-07 DIAGNOSIS — C50911 Malignant neoplasm of unspecified site of right female breast: Secondary | ICD-10-CM

## 2015-10-07 DIAGNOSIS — Z853 Personal history of malignant neoplasm of breast: Secondary | ICD-10-CM | POA: Diagnosis not present

## 2015-10-07 DIAGNOSIS — D72820 Lymphocytosis (symptomatic): Secondary | ICD-10-CM

## 2015-10-07 LAB — CBC WITH DIFFERENTIAL/PLATELET
Basophils Absolute: 0.1 10*3/uL (ref 0–0.1)
Basophils Relative: 1 %
Eosinophils Absolute: 0.1 10*3/uL (ref 0–0.7)
Eosinophils Relative: 1 %
HEMATOCRIT: 42.4 % (ref 35.0–47.0)
Hemoglobin: 14.5 g/dL (ref 12.0–16.0)
LYMPHS ABS: 3.9 10*3/uL — AB (ref 1.0–3.6)
LYMPHS PCT: 49 %
MCH: 32 pg (ref 26.0–34.0)
MCHC: 34.2 g/dL (ref 32.0–36.0)
MCV: 93.6 fL (ref 80.0–100.0)
MONO ABS: 0.7 10*3/uL (ref 0.2–0.9)
MONOS PCT: 9 %
NEUTROS ABS: 3.3 10*3/uL (ref 1.4–6.5)
Neutrophils Relative %: 40 %
Platelets: 268 10*3/uL (ref 150–440)
RBC: 4.52 MIL/uL (ref 3.80–5.20)
RDW: 15 % — AB (ref 11.5–14.5)
WBC: 8.1 10*3/uL (ref 3.6–11.0)

## 2015-10-16 LAB — MISC LABCORP TEST (SEND OUT)
LABCORP TEST CODE: 481080
LABCORP TEST NAME: 481080

## 2015-12-03 ENCOUNTER — Ambulatory Visit
Admission: RE | Admit: 2015-12-03 | Discharge: 2015-12-03 | Disposition: A | Discharge status: Home / Self Care | Payer: Medicare HMO | Source: Ambulatory Visit | Attending: Internal Medicine | Admitting: Internal Medicine

## 2015-12-03 ENCOUNTER — Other Ambulatory Visit: Payer: Self-pay | Admitting: Internal Medicine

## 2015-12-03 DIAGNOSIS — Z853 Personal history of malignant neoplasm of breast: Secondary | ICD-10-CM | POA: Insufficient documentation

## 2015-12-03 DIAGNOSIS — Z1231 Encounter for screening mammogram for malignant neoplasm of breast: Secondary | ICD-10-CM | POA: Insufficient documentation

## 2015-12-03 DIAGNOSIS — Z9011 Acquired absence of right breast and nipple: Secondary | ICD-10-CM | POA: Insufficient documentation

## 2015-12-03 DIAGNOSIS — C50911 Malignant neoplasm of unspecified site of right female breast: Secondary | ICD-10-CM

## 2016-06-02 DIAGNOSIS — Z853 Personal history of malignant neoplasm of breast: Secondary | ICD-10-CM | POA: Insufficient documentation

## 2016-09-09 ENCOUNTER — Emergency Department
Admission: EM | Admit: 2016-09-09 | Discharge: 2016-09-09 | Disposition: A | Discharge status: Home / Self Care | Payer: Medicare HMO | Attending: Emergency Medicine | Admitting: Emergency Medicine

## 2016-09-09 ENCOUNTER — Emergency Department: Payer: Medicare HMO

## 2016-09-09 ENCOUNTER — Encounter: Payer: Self-pay | Admitting: Emergency Medicine

## 2016-09-09 DIAGNOSIS — Z79899 Other long term (current) drug therapy: Secondary | ICD-10-CM | POA: Diagnosis not present

## 2016-09-09 DIAGNOSIS — E039 Hypothyroidism, unspecified: Secondary | ICD-10-CM | POA: Diagnosis not present

## 2016-09-09 DIAGNOSIS — Y999 Unspecified external cause status: Secondary | ICD-10-CM | POA: Diagnosis not present

## 2016-09-09 DIAGNOSIS — Y939 Activity, unspecified: Secondary | ICD-10-CM | POA: Diagnosis not present

## 2016-09-09 DIAGNOSIS — M25511 Pain in right shoulder: Secondary | ICD-10-CM | POA: Insufficient documentation

## 2016-09-09 DIAGNOSIS — W19XXXA Unspecified fall, initial encounter: Secondary | ICD-10-CM

## 2016-09-09 DIAGNOSIS — Z853 Personal history of malignant neoplasm of breast: Secondary | ICD-10-CM | POA: Insufficient documentation

## 2016-09-09 DIAGNOSIS — S0083XA Contusion of other part of head, initial encounter: Secondary | ICD-10-CM

## 2016-09-09 DIAGNOSIS — W1809XA Striking against other object with subsequent fall, initial encounter: Secondary | ICD-10-CM | POA: Diagnosis not present

## 2016-09-09 DIAGNOSIS — I10 Essential (primary) hypertension: Secondary | ICD-10-CM | POA: Insufficient documentation

## 2016-09-09 DIAGNOSIS — Z7901 Long term (current) use of anticoagulants: Secondary | ICD-10-CM | POA: Insufficient documentation

## 2016-09-09 DIAGNOSIS — S0993XA Unspecified injury of face, initial encounter: Secondary | ICD-10-CM | POA: Diagnosis present

## 2016-09-09 DIAGNOSIS — Y92480 Sidewalk as the place of occurrence of the external cause: Secondary | ICD-10-CM | POA: Insufficient documentation

## 2016-09-09 MED ORDER — BACITRACIN-NEOMYCIN-POLYMYXIN 400-5-5000 EX OINT
TOPICAL_OINTMENT | Freq: Once | CUTANEOUS | Status: AC
  Administered 2016-09-09: 1 via TOPICAL
  Filled 2016-09-09: qty 1

## 2016-09-09 MED ORDER — OXYCODONE-ACETAMINOPHEN 5-325 MG PO TABS
1.0000 | ORAL_TABLET | Freq: Once | ORAL | Status: AC
  Administered 2016-09-09: 1 via ORAL
  Filled 2016-09-09: qty 1

## 2016-09-09 MED ORDER — TRAMADOL HCL 50 MG PO TABS: 50.0000 mg | ORAL_TABLET | Freq: Two times a day (BID) | ORAL | 0 refills | Status: DC | PRN

## 2016-09-09 NOTE — Discharge Instructions (Signed)
Wear sling for 2-3 days as needed. Advised Neosporin for facial abrasions.

## 2016-09-09 NOTE — ED Provider Notes (Signed)
[facility] Emergency Department Provider Note   ____________________________________________   First MD Initiated Contact with Patient 09/09/16 [phone]     (approximate)  I have reviewed the triage vital signs and the nursing notes.   HISTORY  Chief Complaint Fall and Shoulder Pain    HPI Suzanne Dixon is a 65 y.o. female patient complaining of facial and right shoulder pain secondary to a fall. Patient states she missed a step onto a curb and fell hitting her face and right shoulder. Patient denies LOC or vertigo. Patient denies loss sensation to the shoulder but decreased range of motion of overhead reaching. Patient denies any other complaint secondary to this fall.Patient rates the pain as a 7/10. Patient scratching her pain as "achy". No palliative measures prior to arrival by EMS.   Past Medical History:  Diagnosis Date  . Breast cancer ([place]) 03/31/2013   Right - chemo- mastectomy  . Breast cancer ([place]) 1991   Rt.- radiation  . GERD (gastroesophageal reflux disease)   . H/O mastectomy 2014   per patient  . H/O: cesarean section 1987   per patient report  . H/O: hysterectomy 1996   per patient  . Hypertension   . Hypothyroidism   . Neuropathy ([place])   . Ruptured cervical disc 2002   per patient   . Tachycardia     Patient Active Problem List   Diagnosis Date Noted  . Allergic state 04/01/2015  . Arthritis 04/01/2015  . Cancer ([place]) 04/01/2015  . Edema leg 04/01/2015  . Gout 04/01/2015  . Hidradenitis 04/01/2015  . Elevated lymphocyte count 04/01/2015  . 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 Apnea, sleep 04/01/2015  . Avitaminosis D 04/01/2015  . Breast cancer ([place]) 04/01/2015  . BP (high blood pressure) 04/15/2014  . B12 deficiency 02/24/2014  . A-fib ([place]) 02/17/2014  . Adiposity 06/04/2012  . Depression, major, recurrent, moderate ([place]) 03/26/2012    Past Surgical History:  Procedure Laterality Date  .  ABDOMINAL HYSTERECTOMY    . BACK SURGERY  2002   plate and 2 screws in neck  . BREAST BIOPSY Right 1991,2014   Positive  . BREAST CYST ASPIRATION Left   . BREAST SURGERY Right 2014   mastectomy  . CESAREAN SECTION    . CHOLECYSTECTOMY    . COLONOSCOPY WITH PROPOFOL N/A 06/22/2015   Procedure: COLONOSCOPY WITH PROPOFOL;  Surgeon: Manya Silvas, MD;  Location: [facility] ENDOSCOPY;  Service: Endoscopy;  Laterality: N/A;  . MASTECTOMY Right   . PORT-A-CATH REMOVAL Left 05/13/2015   Procedure: REMOVAL PORT-A-CATH LEFT CHEST ;  Surgeon: Florene Glen, MD;  Location: [facility] ORS;  Service: General;  Laterality: Left;    Prior to Admission medications   Medication Sig Start Date End Date Taking? Authorizing Provider  allopurinol (ZYLOPRIM) 300 MG tablet Take 300 mg by mouth 2 (two) times daily.    Historical Provider, MD  azelastine (ASTELIN) 0.1 % nasal spray Place 2 sprays into the nose 2 (two) times daily as needed for rhinitis.  02/19/15 02/19/16  Historical Provider, MD  Calcium Carb-Cholecalciferol (SM CALCIUM-VITAMIN D) 600-400 MG-UNIT TABS Take 1 tablet by mouth 2 (two) times daily.     Historical Provider, MD  Cyanocobalamin (RA VITAMIN B-12 TR) 1000 MCG TBCR Take 1 tablet by mouth daily.     Historical Provider, MD  DULoxetine (CYMBALTA) 30 MG capsule Take 30 mg by mouth daily.    Historical Provider, MD  fluticasone (FLONASE) 50 MCG/ACT  nasal spray Place 2 sprays into both nostrils daily.    Historical Provider, MD  gabapentin (NEURONTIN) 300 MG capsule Take 300 mg by mouth 3 (three) times daily.    Historical Provider, MD  gabapentin (NEURONTIN) 600 MG tablet Take 600 mg by mouth 3 (three) times daily. 03/09/15   Historical Provider, MD  levothyroxine (SYNTHROID, LEVOTHROID) 50 MCG tablet Take 50 mcg by mouth daily before breakfast.    Historical Provider, MD  meloxicam (MOBIC) 15 MG tablet Take 15 mg by mouth daily.    Historical Provider, MD  metoprolol (LOPRESSOR) 100 MG tablet Take 200 mg  by mouth 2 (two) times daily.     Historical Provider, MD  Multiple Vitamins tablet Take 2 tablets by mouth daily.     Historical Provider, MD  omeprazole (PRILOSEC) 20 MG capsule Take 20 mg by mouth 2 (two) times daily before a meal.     Historical Provider, MD  potassium chloride (K-DUR,KLOR-CON) 10 MEQ tablet Take 10 mEq by mouth 3 (three) times daily.     Historical Provider, MD  traMADol (ULTRAM) 50 MG tablet Take 50 mg by mouth every 6 (six) hours as needed.    Historical Provider, MD  traMADol (ULTRAM) 50 MG tablet Take 1 tablet (50 mg total) by mouth every 12 (twelve) hours as needed. 09/09/16   Sable Feil, PA-C  warfarin (COUMADIN) 2 MG tablet Take 1 mg by mouth daily at 6 PM. Taken with 5mg  tablet for a total of 7mg     Historical Provider, MD  warfarin (COUMADIN) 5 MG tablet Take 5 mg by mouth daily at 6 PM. Taken with 2mg  tablet for a total of 7mg  03/31/15   Historical Provider, MD    Allergies Advair diskus [fluticasone-salmeterol]; Singulair [montelukast]; Sulfa antibiotics; and Venlafaxine  Family History  Problem Relation Age of Onset  . Hypertension Mother   . Lymphoma Mother   . Cancer Sister     Breast  . Breast cancer Sister 36    Social History Social History  Substance Use Topics  . Smoking status: Never Smoker  . Smokeless tobacco: Never Used  . Alcohol use No    Review of Systems Constitutional: No fever/chills Eyes: No visual changes. ENT: No sore throat. Cardiovascular: Denies chest pain. Respiratory: Denies shortness of breath. Gastrointestinal: No abdominal pain.  No nausea, no vomiting.  No diarrhea.  No constipation. Genitourinary: Negative for dysuria. Musculoskeletal: Right shoulder pain  Skin: Negative for rash. Abrasion to forehead and anterior nose. Neurological: Negative for headaches, focal weakness or numbness. Psychiatric:Depression Endocrine:Hypertension and hypothyroidism Hematological/Lymphatic: Allergic/Immunilogical: See  medication list ____________________________________________   PHYSICAL EXAM:  VITAL SIGNS: ED Triage Vitals  Enc Vitals Group     BP 09/09/16 0949 (!) 143/91     Pulse Rate 09/09/16 0949 72     Resp 09/09/16 0949 20     Temp 09/09/16 0949 97.9 F (36.6 C)     Temp Source 09/09/16 0949 Oral     SpO2 09/09/16 0949 98 %     Weight 09/09/16 0950 (!) 332 lb (150.6 kg)     Height 09/09/16 0950 5\' 4"  (1.626 m)     Head Circumference --      Peak Flow --      Pain Score 09/09/16 0950 7     Pain Loc --      Pain Edu? --      Excl. in [place]? --     Constitutional: Alert and oriented. Well appearing and  in no acute distress. Eyes: Conjunctivae are normal. PERRL. EOMI. Head: Atraumatic. Nose: No congestion/rhinnorhea. Mouth/Throat: Mucous membranes are moist.  Oropharynx non-erythematous. Neck: No stridor.  No cervical spine tenderness to palpation. Hematological/Lymphatic/Immunilogical: No cervical lymphadenopathy. Cardiovascular: Normal rate, regular rhythm. Grossly normal heart sounds.  Good peripheral circulation. Respiratory: Normal respiratory effort.  No retractions. Lungs CTAB. Gastrointestinal: Soft and nontender. No distention. No abdominal bruits. No CVA tenderness. Musculoskeletal: No obvious deformity to the right shoulder. Patient has moderate guarding the humeral head and GH joint. Decreased range of motion with adduction and overhead reaching. In the back complaining of pain. Neurologic:  Normal speech and language. No gross focal neurologic deficits are appreciated. No gait instability. Skin:  Skin is warm, dry and intact. No rash noted. Abrasion to forehead and anterior nose. Psychiatric: Mood and affect are normal. Speech and behavior are normal.  ____________________________________________   LABS (all labs ordered are listed, but only abnormal results are displayed)  Labs Reviewed - No data to  display ____________________________________________  EKG   ____________________________________________  RADIOLOGY  Facial and right shoulder x-ray negative for acute findings. ____________________________________________   PROCEDURES  Procedure(s) performed: None  Procedures  Critical Care performed: No  ____________________________________________   INITIAL IMPRESSION / ASSESSMENT AND PLAN / ED COURSE  Pertinent labs & imaging results that were available during my care of the patient were reviewed by me and considered in my medical decision making (see chart for details). Facial and right shoulder contusion secondary to fall. Patient given discharge care instructions. Patient given a prescription for tramadol and advised follow-up family doctor condition persists.  Clinical Course    Discussed x-ray finding with patient. Patient placed in arm sling for comfort.  ____________________________________________   FINAL CLINICAL IMPRESSION(S) / ED DIAGNOSES  Final diagnoses:  Fall, initial encounter  Acute pain of right shoulder  Contusion of face, initial encounter      NEW MEDICATIONS STARTED DURING THIS VISIT:  New Prescriptions   TRAMADOL (ULTRAM) 50 MG TABLET    Take 1 tablet (50 mg total) by mouth every 12 (twelve) hours as needed.     Note:  This document was prepared using Dragon voice recognition software and may include unintentional dictation errors.    Sable Feil, PA-C 09/09/16 1107    Lisa Roca, MD 09/09/16 [phone]

## 2016-09-09 NOTE — ED Triage Notes (Signed)
Pt presents after falling. She missed a step onto a curb and hit her face and shoulder. Pt has abrasions so forehead and just under her nose. Pt denies LOC or dizziness prior to fall. States that she had a purse around her neck that made her unbalanced.

## 2016-09-09 NOTE — ED Notes (Signed)
Pt discharged home after verbalizing understanding of discharge instructions; nad noted. 

## 2016-10-03 ENCOUNTER — Ambulatory Visit: Payer: Medicare HMO

## 2016-10-03 ENCOUNTER — Other Ambulatory Visit: Payer: Medicare HMO

## 2016-10-03 ENCOUNTER — Other Ambulatory Visit: Payer: Self-pay | Admitting: *Deleted

## 2016-10-03 DIAGNOSIS — Z171 Estrogen receptor negative status [ER-]: Principal | ICD-10-CM

## 2016-10-03 DIAGNOSIS — D72820 Lymphocytosis (symptomatic): Secondary | ICD-10-CM

## 2016-10-03 DIAGNOSIS — C50911 Malignant neoplasm of unspecified site of right female breast: Secondary | ICD-10-CM

## 2016-10-04 ENCOUNTER — Inpatient Hospital Stay: Payer: Medicare HMO | Attending: Oncology | Admitting: Oncology

## 2016-10-04 ENCOUNTER — Inpatient Hospital Stay: Payer: Medicare HMO

## 2016-10-04 VITALS — BP 152/97 | HR 71 | Temp 97.5°F | Resp 18 | Wt 330.9 lb

## 2016-10-04 DIAGNOSIS — I10 Essential (primary) hypertension: Secondary | ICD-10-CM | POA: Insufficient documentation

## 2016-10-04 DIAGNOSIS — C50911 Malignant neoplasm of unspecified site of right female breast: Secondary | ICD-10-CM

## 2016-10-04 DIAGNOSIS — K219 Gastro-esophageal reflux disease without esophagitis: Secondary | ICD-10-CM | POA: Insufficient documentation

## 2016-10-04 DIAGNOSIS — Z9011 Acquired absence of right breast and nipple: Secondary | ICD-10-CM | POA: Diagnosis not present

## 2016-10-04 DIAGNOSIS — E039 Hypothyroidism, unspecified: Secondary | ICD-10-CM | POA: Insufficient documentation

## 2016-10-04 DIAGNOSIS — Z90710 Acquired absence of both cervix and uterus: Secondary | ICD-10-CM | POA: Diagnosis not present

## 2016-10-04 DIAGNOSIS — Z7901 Long term (current) use of anticoagulants: Secondary | ICD-10-CM | POA: Diagnosis not present

## 2016-10-04 DIAGNOSIS — Z171 Estrogen receptor negative status [ER-]: Secondary | ICD-10-CM

## 2016-10-04 DIAGNOSIS — Z79899 Other long term (current) drug therapy: Secondary | ICD-10-CM | POA: Diagnosis not present

## 2016-10-04 DIAGNOSIS — Z9181 History of falling: Secondary | ICD-10-CM | POA: Insufficient documentation

## 2016-10-04 DIAGNOSIS — Z9221 Personal history of antineoplastic chemotherapy: Secondary | ICD-10-CM | POA: Insufficient documentation

## 2016-10-04 DIAGNOSIS — D72820 Lymphocytosis (symptomatic): Secondary | ICD-10-CM

## 2016-10-04 DIAGNOSIS — Z853 Personal history of malignant neoplasm of breast: Secondary | ICD-10-CM | POA: Diagnosis not present

## 2016-10-04 DIAGNOSIS — C91Z0 Other lymphoid leukemia not having achieved remission: Secondary | ICD-10-CM | POA: Diagnosis present

## 2016-10-04 LAB — CBC WITH DIFFERENTIAL/PLATELET
Basophils Absolute: 0.1 10*3/uL (ref 0–0.1)
Basophils Relative: 1 %
EOS ABS: 0.2 10*3/uL (ref 0–0.7)
EOS PCT: 2 %
HCT: 40.8 % (ref 35.0–47.0)
HEMOGLOBIN: 14.1 g/dL (ref 12.0–16.0)
Lymphocytes Relative: 51 %
Lymphs Abs: 4.7 10*3/uL — ABNORMAL HIGH (ref 1.0–3.6)
MCH: 31.9 pg (ref 26.0–34.0)
MCHC: 34.5 g/dL (ref 32.0–36.0)
MCV: 92.6 fL (ref 80.0–100.0)
MONO ABS: 1 10*3/uL — AB (ref 0.2–0.9)
MONOS PCT: 11 %
Neutro Abs: 3.3 10*3/uL (ref 1.4–6.5)
Neutrophils Relative %: 35 %
PLATELETS: 278 10*3/uL (ref 150–440)
RBC: 4.4 MIL/uL (ref 3.80–5.20)
RDW: 14.6 % — ABNORMAL HIGH (ref 11.5–14.5)
WBC: 9.2 10*3/uL (ref 3.6–11.0)

## 2016-10-04 NOTE — Progress Notes (Signed)
Hematology/Oncology Consult note [facility]  [phone] [phone]  Patient Care Team: Adin Hector, MD as PCP - General (Internal Medicine)   Name of the patient: Suzanne Dixon  [id number]  25-Jun-1951   Date of visit: 10/04/16  Diagnosis-1. 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 HIV, SPEP and CT/PET for 2012 reported as negative. Right breast cancer diagnosed in 2014, ER/PR negative HER-2/neu negative, BRCA negative. Patient is status post right mastectomy following diagnosis of infiltrating ductal carcinoma. Patient completed 6 cycles of FEC in 2014. Patient had subsequent right arm lymphedema. Patient also has significant history for benign pulmonary nodules noted on CT scan from 2013, that have remained stable. Last CT March 2015.  Test Ordered: [id number] T Cell Panel (gamma, beta)  T-Cell PCR Interpretation   Comment          YU   POSITIVE: A clonal T-cell receptor gamma (TCRG) population was      detected  Interpretation of this result should be made in the context of other  clinical, morphologic, and immunophenotypic findings. The presence  of a monoclonal gene rearrangement usually reflects the presence of a  T-lymphocytic neoplasm. Occasionally, B-cell neoplasms may  demonstrate "lineage infidelity", i.e. rearranged T-cell receptor  genes. Other possible reasons for detecting a clonal T-cell  population include transient clonal proliferations due to certain  viral infections or drug-associated cutaneous eruptions associated  with dermal lymphoid proliferation  processes. Clonal T-cell receptor  gene rearrangements have also been described in other non-malignant  disorders including lymphomatoid papulosis and large granular  lymphocytosis.    Interval history- reports doing well. Denies any complaints  ECOG PS- 2 Pain scale- 0 Opioid associated constipation- no  Review of systems- Review of Systems  Constitutional: Negative for chills, fever, malaise/fatigue and weight loss.  HENT: Negative for congestion, ear discharge and nosebleeds.   Eyes: Negative for blurred vision.  Respiratory: Negative for cough, hemoptysis, sputum production, shortness of breath and wheezing.   Cardiovascular: Negative for chest pain, palpitations, orthopnea and claudication.  Gastrointestinal: Negative for abdominal pain, blood in stool, constipation, diarrhea, heartburn, melena, nausea and vomiting.  Genitourinary: Negative for dysuria, flank pain, frequency, hematuria and urgency.  Musculoskeletal: Negative for back pain, joint pain and myalgias.  Skin: Negative for rash.  Neurological: Negative for dizziness, tingling, focal weakness, seizures, weakness and headaches.  Endo/Heme/Allergies: Does not bruise/bleed easily.  Psychiatric/Behavioral: Negative for depression and suicidal ideas. The patient does not have insomnia.    Breast exam: Patient is status post right mastectomy without reconstruction. There is no evidence of any chest wall recurrence. No evidence of bilateral axillary adenopathy. No palpable masses in the left breast   Current treatment- observation  Allergies  Allergen Reactions  . Advair Diskus [Fluticasone-Salmeterol]   . Singulair [Montelukast]     "muscle pain"  . Sulfa Antibiotics Hives  . Venlafaxine     "altered mental status/tremors"     Past Medical History:  Diagnosis Date  . Breast cancer ([place]) 03/31/2013   Right - chemo- mastectomy  . Breast cancer ([place]) 1991   Rt.- radiation  . GERD (gastroesophageal reflux disease)     . H/O  mastectomy 2014   per patient  . H/O: cesarean section 1987   per patient report  . H/O: hysterectomy 1996   per patient  . Hypertension   . Hypothyroidism   . Neuropathy ([place])   . Ruptured cervical disc 2002   per patient   . Tachycardia      Past Surgical History:  Procedure Laterality Date  . ABDOMINAL HYSTERECTOMY    . BACK SURGERY  2002   plate and 2 screws in neck  . BREAST BIOPSY Right 1991,2014   Positive  . BREAST CYST ASPIRATION Left   . BREAST SURGERY Right 2014   mastectomy  . CESAREAN SECTION    . CHOLECYSTECTOMY    . COLONOSCOPY WITH PROPOFOL N/A 06/22/2015   Procedure: COLONOSCOPY WITH PROPOFOL;  Surgeon: Manya Silvas, MD;  Location: [facility] ENDOSCOPY;  Service: Endoscopy;  Laterality: N/A;  . MASTECTOMY Right   . PORT-A-CATH REMOVAL Left 05/13/2015   Procedure: REMOVAL PORT-A-CATH LEFT CHEST ;  Surgeon: Florene Glen, MD;  Location: [facility] ORS;  Service: General;  Laterality: Left;    Social History   Social History  . Marital status: Divorced    Spouse name: N/A  . Number of children: N/A  . Years of education: N/A   Occupational History  . Not on file.   Social History Main Topics  . Smoking status: Never Smoker  . Smokeless tobacco: Never Used  . Alcohol use No  . Drug use: No  . Sexual activity: Not on file   Other Topics Concern  . Not on file   Social History Narrative  . No narrative on file    Family History  Problem Relation Age of Onset  . Hypertension Mother   . Lymphoma Mother   . Cancer Sister     Breast  . Breast cancer Sister 28     Current Outpatient Prescriptions:  .  allopurinol (ZYLOPRIM) 300 MG tablet, Take 300 mg by mouth 2 (two) times daily., Disp: , Rfl:  .  azelastine (ASTELIN) 0.1 % nasal spray, Place 2 sprays into the nose 2 (two) times daily as needed for rhinitis. , Disp: , Rfl:  .  Calcium Carb-Cholecalciferol (SM CALCIUM-VITAMIN D) 600-400 MG-UNIT TABS, Take 1 tablet by mouth 2 (two) times  daily. , Disp: , Rfl:  .  Cyanocobalamin (RA VITAMIN B-12 TR) 1000 MCG TBCR, Take 1 tablet by mouth daily. , Disp: , Rfl:  .  DULoxetine (CYMBALTA) 30 MG capsule, Take 30 mg by mouth daily., Disp: , Rfl:  .  fluticasone (FLONASE) 50 MCG/ACT nasal spray, Place 2 sprays into both nostrils daily., Disp: , Rfl:  .  gabapentin (NEURONTIN) 300 MG capsule, Take 300 mg by mouth 3 (three) times daily., Disp: , Rfl:  .  gabapentin (NEURONTIN) 600 MG tablet, Take 600 mg by mouth 3 (three) times daily., Disp: , Rfl: 11 .  levothyroxine (SYNTHROID, LEVOTHROID) 50 MCG tablet, Take 50 mcg by mouth daily before breakfast., Disp: , Rfl:  .  meloxicam (MOBIC) 15 MG tablet, Take 15 mg by mouth daily., Disp: , Rfl:  .  metoprolol (LOPRESSOR) 100 MG tablet, Take 200 mg by mouth 2 (two) times daily. , Disp: , Rfl:  .  Multiple Vitamins tablet, Take 2 tablets by mouth daily. , Disp: , Rfl:  .  omeprazole (PRILOSEC) 20 MG capsule, Take 20 mg by mouth 2 (two) times daily before a meal. , Disp: , Rfl:  .  potassium chloride (K-DUR,KLOR-CON) 10 MEQ  tablet, Take 10 mEq by mouth 3 (three) times daily. , Disp: , Rfl:  .  traMADol (ULTRAM) 50 MG tablet, Take 50 mg by mouth every 6 (six) hours as needed., Disp: , Rfl:  .  traMADol (ULTRAM) 50 MG tablet, Take 1 tablet (50 mg total) by mouth every 12 (twelve) hours as needed., Disp: 12 tablet, Rfl: 0 .  warfarin (COUMADIN) 2 MG tablet, Take 1 mg by mouth daily at 6 PM. Taken with 38m tablet for a total of 7331m Disp: , Rfl:  .  warfarin (COUMADIN) 5 MG tablet, Take 5 mg by mouth daily at 6 PM. Taken with 31m36mablet for a total of 7mg53misp: , Rfl:   Physical exam:  Vitals:   10/04/16 1447  BP: (!) 152/97  Pulse: 71  Resp: 18  Temp: 97.5 F (36.4 C)  TempSrc: Tympanic  Weight: (!) 330 lb 14.6 oz (150.1 kg)   Physical Exam  Constitutional: She is oriented to person, place, and time.  She is morbidly obese. Appears in no acute distress  HENT:  Head: Normocephalic and  atraumatic.  Eyes: EOM are normal. Pupils are equal, round, and reactive to light.  Neck: Normal range of motion.  Cardiovascular: Normal rate, regular rhythm and normal heart sounds.   Pulmonary/Chest: Effort normal and breath sounds normal.  Abdominal: Soft. Bowel sounds are normal.  Neurological: She is alert and oriented to person, place, and time.  Skin: Skin is warm and dry.     CMP Latest Ref Rng & Units 05/05/2015  Glucose 65 - 99 mg/dL 66  BUN 6 - 20 mg/dL 9  Creatinine 0.44 - 1.00 mg/dL 0.75  Sodium 135 - 145 mmol/L 141  Potassium 3.5 - 5.1 mmol/L 4.4  Chloride 101 - 111 mmol/L 103  CO2 22 - 32 mmol/L 29  Calcium 8.9 - 10.3 mg/dL 8.9  Total Protein 6.4 - 8.2 g/dL -  Total Bilirubin 0.2 - 1.0 mg/dL -  Alkaline Phos 50 - 136 Unit/L -  AST 15 - 37 Unit/L -  ALT 12 - 78 U/L -   CBC Latest Ref Rng & Units 10/07/2015  WBC 3.6 - 11.0 K/uL 8.1  Hemoglobin 12.0 - 16.0 g/dL 14.5  Hematocrit 35.0 - 47.0 % 42.4  Platelets 150 - 440 K/uL 268    No images are attached to the encounter.  Dg Facial Bones Complete  Result Date: 09/09/2016 CLINICAL DATA:  Trip and fall off a curb with a facial injury. Initial encounter. EXAM: FACIAL BONES COMPLETE 3+V COMPARISON:  None. FINDINGS: There is no evidence of fracture or other significant bone abnormality. No orbital emphysema or sinus air-fluid levels are seen. IMPRESSION: Negative exam. Electronically Signed   By: ThomInge Rise.   On: 09/09/2016 10:52   Dg Shoulder Right  Result Date: 09/09/2016 CLINICAL DATA:  Fall today stepping off a curb with a right shoulder injury. Initial encounter. EXAM: RIGHT SHOULDER - 2+ VIEW COMPARISON:  None. FINDINGS: No acute bony or joint abnormality is seen. Acromioclavicular osteoarthritis is noted. Subacromial spurring is seen. Imaged right lung and ribs appear normal. IMPRESSION: No acute abnormality. Electronically Signed   By: ThomInge Rise.   On: 09/09/2016 10:53     Assessment and  plan- Patient is a 65 y47. [place] medical [place]:  1. Triple negative right breast cancer in 2014 status post mastectomy and adjuvant chemotherapy. She did not receive adjuvant radiation because of prior h/o RT to  her left breast. Clinically she is doing well and there is no evidence of recurrence on today's exam. We will schedule left breast mammogram for March 2018.  2. LGL- noted on flow cytometry for workup done for lymphocytosis. Patient does not have any cytopenias or bony symptoms and does not need anytreatment for her LGL at this time. We will follow this up with a repeat cbc with diff in 6 months time.   Visit Diagnosis 1. 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 Randa Evens, MD, MPH [place] [facility] Pager- [phone] 10/04/2016 8:28 AM

## 2016-10-04 NOTE — Progress Notes (Signed)
Patient is here for follow up, she is doing well besides pain in her right shoulder

## 2016-11-07 ENCOUNTER — Other Ambulatory Visit: Payer: Self-pay | Admitting: Student

## 2016-11-07 DIAGNOSIS — M7581 Other shoulder lesions, right shoulder: Secondary | ICD-10-CM

## 2016-11-07 DIAGNOSIS — M25511 Pain in right shoulder: Secondary | ICD-10-CM

## 2016-11-18 ENCOUNTER — Ambulatory Visit
Admission: RE | Admit: 2016-11-18 | Discharge: 2016-11-18 | Disposition: A | Discharge status: Home / Self Care | Payer: Medicare HMO | Source: Ambulatory Visit | Attending: Student | Admitting: Student

## 2016-11-18 DIAGNOSIS — M25511 Pain in right shoulder: Secondary | ICD-10-CM | POA: Diagnosis not present

## 2016-11-18 DIAGNOSIS — M7581 Other shoulder lesions, right shoulder: Secondary | ICD-10-CM | POA: Insufficient documentation

## 2016-11-18 DIAGNOSIS — M75121 Complete rotator cuff tear or rupture of right shoulder, not specified as traumatic: Secondary | ICD-10-CM | POA: Insufficient documentation

## 2016-12-01 ENCOUNTER — Other Ambulatory Visit: Payer: Self-pay | Admitting: Internal Medicine

## 2016-12-01 DIAGNOSIS — Z1231 Encounter for screening mammogram for malignant neoplasm of breast: Secondary | ICD-10-CM

## 2016-12-05 DIAGNOSIS — M19011 Primary osteoarthritis, right shoulder: Secondary | ICD-10-CM | POA: Insufficient documentation

## 2016-12-05 DIAGNOSIS — M7521 Bicipital tendinitis, right shoulder: Secondary | ICD-10-CM | POA: Insufficient documentation

## 2016-12-05 DIAGNOSIS — M75121 Complete rotator cuff tear or rupture of right shoulder, not specified as traumatic: Secondary | ICD-10-CM | POA: Insufficient documentation

## 2016-12-20 ENCOUNTER — Encounter
Admission: RE | Admit: 2016-12-20 | Discharge: 2016-12-20 | Disposition: A | Discharge status: Home / Self Care | Payer: Medicare HMO | Source: Ambulatory Visit | Attending: Surgery | Admitting: Surgery

## 2016-12-20 DIAGNOSIS — Z01818 Encounter for other preprocedural examination: Secondary | ICD-10-CM | POA: Insufficient documentation

## 2016-12-20 HISTORY — DX: Deficiency of other specified B group vitamins: E53.8

## 2016-12-20 HISTORY — DX: Malignant neoplasm of nipple and areola, unspecified female breast: C50.019

## 2016-12-20 HISTORY — DX: Gout, unspecified: M10.9

## 2016-12-20 HISTORY — DX: Depression, unspecified: F32.A

## 2016-12-20 HISTORY — DX: Other cervical disc degeneration, unspecified cervical region: M50.30

## 2016-12-20 HISTORY — DX: Major depressive disorder, single episode, unspecified: F32.9

## 2016-12-20 HISTORY — DX: Hidradenitis suppurativa: L73.2

## 2016-12-20 HISTORY — DX: Sleep apnea, unspecified: G47.30

## 2016-12-20 HISTORY — DX: Localized edema: R60.0

## 2016-12-20 NOTE — Pre-Procedure Instructions (Signed)
  ECG 12-lead1/18/2017 [place] Component Name Value Ref Range  Vent Rate (bpm) 72   PR Interval (msec) 160   QRS Interval (msec) 94   QT Interval (msec) 408   QTc (msec) 446   Result Narrative  Normal sinus rhythm Minimal voltage criteria for LVH, may be normal variant Borderline ECG When compared with ECG of 10-Apr-2015 08:53, No significant change was found I reviewed and concur with this report. Electronically signed LI:DCVU MD, KEN [phone]) on 10/14/2015 1:55:08 PM

## 2016-12-20 NOTE — Patient Instructions (Signed)
Your procedure is scheduled on: Tuesday 01/03/17 Report to [place]. 2ND FLOOR MEDICAL MALL ENTRANCE. To find out your arrival time please call [phone] between 1PM - 3PM on Monday 01/02/17.  Remember: Instructions that are not followed completely may result in serious medical risk, up to and including death, or upon the discretion of your surgeon and anesthesiologist your surgery may need to be rescheduled.    __X__ 1. Do not eat food or drink liquids after midnight. No gum chewing or hard candies.     __X__ 2. No Alcohol for 24 hours before or after surgery.   ____ 3. Bring all medications with you on the day of surgery if instructed.    __X__ 4. Notify your doctor if there is any change in your medical condition     (cold, fever, infections).             ___X__5. No smoking within 24 hours of your surgery.     Do not wear jewelry, make-up, hairpins, clips or nail polish.  Do not wear lotions, powders, or perfumes.   Do not shave 48 hours prior to surgery. Men may shave face and neck.  Do not bring valuables to the hospital.    [facility] is not responsible for any belongings or valuables.               Contacts, dentures or bridgework may not be worn into surgery.  Leave your suitcase in the car. After surgery it may be brought to your room.  For patients admitted to the hospital, discharge time is determined by your                treatment team.   Patients discharged the day of surgery will not be allowed to drive home.   Please read over the following fact sheets that you were given:   Pain Booklet and MRSA Information   __X__ Take these medicines the morning of surgery with A SIP OF WATER:    1. DULOXETINE (CYMBALTA)  2. GABAPENTIN (NEURONTIN)  3. LEVOTHYROXINE (SYNTHROID)  4. MAGNESIUM  5. OMEPRAZOLE  6. TRAMADOL IF NEEDED  7. METOPROLOL (LOPRESSOR)  ____ Fleet Enema (as directed)   __X__ Use CHG Soap as directed  ____ Use inhalers on the day of  surgery  ____ Stop metformin 2 days prior to surgery    ____ Take 1/2 of usual insulin dose the night before surgery and none on the morning of surgery.   __X__ Stop Coumadin/Plavix/aspirin on CONTACT SURGEONS OFFICE REGARDING TIME TO STOP COUMADIN  __X__ Stop Anti-inflammatories such as Advil, Aleve, Ibuprofen, Motrin, Naproxen, Naprosyn, Goodies,powder, or aspirin products. STOP MELOXICAM 12/27/16 OK to take Tylenol.   ____ Stop supplements until after surgery.    __X__ Bring C-Pap to the hospital.

## 2017-01-03 ENCOUNTER — Encounter: Payer: Self-pay | Admitting: Surgery

## 2017-01-03 ENCOUNTER — Ambulatory Visit: Payer: Medicare HMO | Admitting: Anesthesiology

## 2017-01-03 ENCOUNTER — Ambulatory Visit
Admission: RE | Admit: 2017-01-03 | Discharge: 2017-01-03 | Disposition: A | Discharge status: Home / Self Care | Payer: Medicare HMO | Source: Ambulatory Visit | Attending: Surgery | Admitting: Surgery

## 2017-01-03 ENCOUNTER — Ambulatory Visit: Payer: Medicare HMO

## 2017-01-03 ENCOUNTER — Encounter
Admission: RE | Disposition: A | Discharge status: Home / Self Care | Payer: Self-pay | Source: Ambulatory Visit | Attending: Surgery

## 2017-01-03 DIAGNOSIS — N393 Stress incontinence (female) (male): Secondary | ICD-10-CM | POA: Insufficient documentation

## 2017-01-03 DIAGNOSIS — E669 Obesity, unspecified: Secondary | ICD-10-CM | POA: Diagnosis not present

## 2017-01-03 DIAGNOSIS — Z807 Family history of other malignant neoplasms of lymphoid, hematopoietic and related tissues: Secondary | ICD-10-CM | POA: Insufficient documentation

## 2017-01-03 DIAGNOSIS — M479 Spondylosis, unspecified: Secondary | ICD-10-CM | POA: Insufficient documentation

## 2017-01-03 DIAGNOSIS — Z90710 Acquired absence of both cervix and uterus: Secondary | ICD-10-CM | POA: Insufficient documentation

## 2017-01-03 DIAGNOSIS — Y939 Activity, unspecified: Secondary | ICD-10-CM | POA: Insufficient documentation

## 2017-01-03 DIAGNOSIS — E538 Deficiency of other specified B group vitamins: Secondary | ICD-10-CM | POA: Diagnosis not present

## 2017-01-03 DIAGNOSIS — I4891 Unspecified atrial fibrillation: Secondary | ICD-10-CM | POA: Insufficient documentation

## 2017-01-03 DIAGNOSIS — L732 Hidradenitis suppurativa: Secondary | ICD-10-CM | POA: Insufficient documentation

## 2017-01-03 DIAGNOSIS — W010XXA Fall on same level from slipping, tripping and stumbling without subsequent striking against object, initial encounter: Secondary | ICD-10-CM | POA: Diagnosis not present

## 2017-01-03 DIAGNOSIS — G4730 Sleep apnea, unspecified: Secondary | ICD-10-CM | POA: Insufficient documentation

## 2017-01-03 DIAGNOSIS — Z9011 Acquired absence of right breast and nipple: Secondary | ICD-10-CM | POA: Insufficient documentation

## 2017-01-03 DIAGNOSIS — Z9884 Bariatric surgery status: Secondary | ICD-10-CM | POA: Insufficient documentation

## 2017-01-03 DIAGNOSIS — Z853 Personal history of malignant neoplasm of breast: Secondary | ICD-10-CM | POA: Insufficient documentation

## 2017-01-03 DIAGNOSIS — K219 Gastro-esophageal reflux disease without esophagitis: Secondary | ICD-10-CM | POA: Insufficient documentation

## 2017-01-03 DIAGNOSIS — E559 Vitamin D deficiency, unspecified: Secondary | ICD-10-CM | POA: Diagnosis not present

## 2017-01-03 DIAGNOSIS — Z9049 Acquired absence of other specified parts of digestive tract: Secondary | ICD-10-CM | POA: Diagnosis not present

## 2017-01-03 DIAGNOSIS — M12811 Other specific arthropathies, not elsewhere classified, right shoulder: Secondary | ICD-10-CM | POA: Insufficient documentation

## 2017-01-03 DIAGNOSIS — M5030 Other cervical disc degeneration, unspecified cervical region: Secondary | ICD-10-CM | POA: Insufficient documentation

## 2017-01-03 DIAGNOSIS — M109 Gout, unspecified: Secondary | ICD-10-CM | POA: Insufficient documentation

## 2017-01-03 DIAGNOSIS — G629 Polyneuropathy, unspecified: Secondary | ICD-10-CM | POA: Diagnosis not present

## 2017-01-03 DIAGNOSIS — F329 Major depressive disorder, single episode, unspecified: Secondary | ICD-10-CM | POA: Diagnosis not present

## 2017-01-03 DIAGNOSIS — Z888 Allergy status to other drugs, medicaments and biological substances status: Secondary | ICD-10-CM | POA: Insufficient documentation

## 2017-01-03 DIAGNOSIS — Z6841 Body Mass Index (BMI) 40.0 and over, adult: Secondary | ICD-10-CM | POA: Insufficient documentation

## 2017-01-03 DIAGNOSIS — Z7901 Long term (current) use of anticoagulants: Secondary | ICD-10-CM | POA: Insufficient documentation

## 2017-01-03 DIAGNOSIS — S46011A Strain of muscle(s) and tendon(s) of the rotator cuff of right shoulder, initial encounter: Secondary | ICD-10-CM | POA: Insufficient documentation

## 2017-01-03 DIAGNOSIS — I10 Essential (primary) hypertension: Secondary | ICD-10-CM | POA: Insufficient documentation

## 2017-01-03 DIAGNOSIS — M7541 Impingement syndrome of right shoulder: Secondary | ICD-10-CM | POA: Diagnosis present

## 2017-01-03 DIAGNOSIS — Z833 Family history of diabetes mellitus: Secondary | ICD-10-CM | POA: Insufficient documentation

## 2017-01-03 DIAGNOSIS — Z882 Allergy status to sulfonamides status: Secondary | ICD-10-CM | POA: Insufficient documentation

## 2017-01-03 DIAGNOSIS — Z79899 Other long term (current) drug therapy: Secondary | ICD-10-CM | POA: Insufficient documentation

## 2017-01-03 DIAGNOSIS — I480 Paroxysmal atrial fibrillation: Secondary | ICD-10-CM | POA: Insufficient documentation

## 2017-01-03 DIAGNOSIS — Z825 Family history of asthma and other chronic lower respiratory diseases: Secondary | ICD-10-CM | POA: Diagnosis not present

## 2017-01-03 DIAGNOSIS — Z8249 Family history of ischemic heart disease and other diseases of the circulatory system: Secondary | ICD-10-CM | POA: Insufficient documentation

## 2017-01-03 DIAGNOSIS — D72820 Lymphocytosis (symptomatic): Secondary | ICD-10-CM | POA: Insufficient documentation

## 2017-01-03 DIAGNOSIS — E079 Disorder of thyroid, unspecified: Secondary | ICD-10-CM | POA: Diagnosis not present

## 2017-01-03 DIAGNOSIS — Y92210 Daycare center as the place of occurrence of the external cause: Secondary | ICD-10-CM | POA: Insufficient documentation

## 2017-01-03 HISTORY — PX: SHOULDER ARTHROSCOPY WITH OPEN ROTATOR CUFF REPAIR: SHX6092

## 2017-01-03 HISTORY — PX: SHOULDER ARTHROSCOPY WITH SUBACROMIAL DECOMPRESSION, ROTATOR CUFF REPAIR AND BICEP TENDON REPAIR: SHX5687

## 2017-01-03 SURGERY — ARTHROSCOPY, SHOULDER WITH REPAIR, ROTATOR CUFF, OPEN
Anesthesia: General | Site: Shoulder | Laterality: Right

## 2017-01-03 MED ORDER — ONDANSETRON HCL 4 MG/2ML IJ SOLN: 4.0000 mg | Freq: Four times a day (QID) | INTRAMUSCULAR | Status: DC | PRN

## 2017-01-03 MED ORDER — PHENYLEPHRINE HCL 10 MG/ML IJ SOLN
INTRAMUSCULAR | Status: DC | PRN
  Administered 2017-01-03: 200 ug via INTRAVENOUS

## 2017-01-03 MED ORDER — LIDOCAINE HCL (PF) 2 % IJ SOLN
INTRAMUSCULAR | Status: AC
  Filled 2017-01-03: qty 2

## 2017-01-03 MED ORDER — LIDOCAINE HCL (CARDIAC) 20 MG/ML IV SOLN
INTRAVENOUS | Status: DC | PRN
  Administered 2017-01-03: 100 mg via INTRAVENOUS

## 2017-01-03 MED ORDER — ROPIVACAINE HCL 5 MG/ML IJ SOLN
INTRAMUSCULAR | Status: DC | PRN
  Administered 2017-01-03: 30 mL via PERINEURAL

## 2017-01-03 MED ORDER — SUGAMMADEX SODIUM 200 MG/2ML IV SOLN
INTRAVENOUS | Status: DC | PRN
  Administered 2017-01-03: 500 mg via INTRAVENOUS

## 2017-01-03 MED ORDER — OXYCODONE HCL 5 MG PO TABS: 5.0000 mg | ORAL_TABLET | ORAL | 0 refills | Status: DC | PRN

## 2017-01-03 MED ORDER — DEXAMETHASONE SODIUM PHOSPHATE 4 MG/ML IJ SOLN
INTRAMUSCULAR | Status: DC | PRN
  Administered 2017-01-03: 5 mg via INTRAVENOUS

## 2017-01-03 MED ORDER — SUGAMMADEX SODIUM 500 MG/5ML IV SOLN
INTRAVENOUS | Status: AC
  Filled 2017-01-03: qty 5

## 2017-01-03 MED ORDER — LIDOCAINE HCL (PF) 1 % IJ SOLN
INTRAMUSCULAR | Status: AC
  Filled 2017-01-03: qty 5

## 2017-01-03 MED ORDER — ROCURONIUM BROMIDE 50 MG/5ML IV SOLN
INTRAVENOUS | Status: AC
  Filled 2017-01-03: qty 1

## 2017-01-03 MED ORDER — PHENYLEPHRINE HCL 10 MG/ML IJ SOLN
INTRAVENOUS | Status: DC | PRN
  Administered 2017-01-03: 10 ug/min via INTRAVENOUS

## 2017-01-03 MED ORDER — GLYCOPYRROLATE 0.2 MG/ML IJ SOLN
INTRAMUSCULAR | Status: DC | PRN
  Administered 2017-01-03: 0.2 mg via INTRAVENOUS

## 2017-01-03 MED ORDER — OXYCODONE HCL 5 MG PO TABS: 5.0000 mg | ORAL_TABLET | ORAL | Status: DC | PRN

## 2017-01-03 MED ORDER — POTASSIUM CHLORIDE IN NACL 20-0.9 MEQ/L-% IV SOLN
INTRAVENOUS | Status: DC
  Filled 2017-01-03: qty 1000

## 2017-01-03 MED ORDER — FENTANYL CITRATE (PF) 250 MCG/5ML IJ SOLN
INTRAMUSCULAR | Status: AC
Start: 2017-01-03 — End: 2017-01-03
  Filled 2017-01-03: qty 5

## 2017-01-03 MED ORDER — OXYCODONE HCL 5 MG PO TABS: 5.0000 mg | ORAL_TABLET | Freq: Once | ORAL | Status: DC | PRN

## 2017-01-03 MED ORDER — ONDANSETRON HCL 4 MG PO TABS: 4.0000 mg | ORAL_TABLET | Freq: Four times a day (QID) | ORAL | Status: DC | PRN

## 2017-01-03 MED ORDER — MEPERIDINE HCL 50 MG/ML IJ SOLN: 6.2500 mg | INTRAMUSCULAR | Status: DC | PRN

## 2017-01-03 MED ORDER — METOCLOPRAMIDE HCL 5 MG/ML IJ SOLN: 5.0000 mg | Freq: Three times a day (TID) | INTRAMUSCULAR | Status: DC | PRN

## 2017-01-03 MED ORDER — MIDAZOLAM HCL 2 MG/2ML IJ SOLN
INTRAMUSCULAR | Status: AC
  Filled 2017-01-03: qty 2

## 2017-01-03 MED ORDER — FAMOTIDINE 20 MG PO TABS
20.0000 mg | ORAL_TABLET | Freq: Once | ORAL | Status: AC
  Administered 2017-01-03: 20 mg via ORAL

## 2017-01-03 MED ORDER — BUPIVACAINE-EPINEPHRINE (PF) 0.5% -1:200000 IJ SOLN
INTRAMUSCULAR | Status: AC
  Filled 2017-01-03: qty 30

## 2017-01-03 MED ORDER — LIDOCAINE HCL (PF) 1 % IJ SOLN
INTRAMUSCULAR | Status: DC | PRN
  Administered 2017-01-03: 3 mL

## 2017-01-03 MED ORDER — MIDAZOLAM HCL 2 MG/2ML IJ SOLN
INTRAMUSCULAR | Status: DC | PRN
  Administered 2017-01-03: 2 mg via INTRAVENOUS

## 2017-01-03 MED ORDER — PROPOFOL 10 MG/ML IV BOLUS
INTRAVENOUS | Status: DC | PRN
  Administered 2017-01-03: 200 mg via INTRAVENOUS

## 2017-01-03 MED ORDER — LACTATED RINGERS IV SOLN
INTRAVENOUS | Status: DC
  Administered 2017-01-03: 07:00:00 via INTRAVENOUS

## 2017-01-03 MED ORDER — PROPOFOL 10 MG/ML IV BOLUS
INTRAVENOUS | Status: AC
  Filled 2017-01-03: qty 20

## 2017-01-03 MED ORDER — ONDANSETRON HCL 4 MG/2ML IJ SOLN
INTRAMUSCULAR | Status: AC
  Filled 2017-01-03: qty 2

## 2017-01-03 MED ORDER — EPINEPHRINE PF 1 MG/ML IJ SOLN
INTRAMUSCULAR | Status: AC
  Filled 2017-01-03: qty 2

## 2017-01-03 MED ORDER — DEXAMETHASONE SODIUM PHOSPHATE 10 MG/ML IJ SOLN
INTRAMUSCULAR | Status: AC
Start: 2017-01-03 — End: 2017-01-03
  Filled 2017-01-03: qty 1

## 2017-01-03 MED ORDER — ONDANSETRON HCL 4 MG/2ML IJ SOLN
INTRAMUSCULAR | Status: DC | PRN
  Administered 2017-01-03: 4 mg via INTRAVENOUS

## 2017-01-03 MED ORDER — PROMETHAZINE HCL 25 MG/ML IJ SOLN
6.2500 mg | INTRAMUSCULAR | Status: DC | PRN
Start: 2017-01-03 — End: 2017-01-03

## 2017-01-03 MED ORDER — CEFAZOLIN SODIUM 1 G IJ SOLR
INTRAMUSCULAR | Status: AC
  Filled 2017-01-03: qty 10

## 2017-01-03 MED ORDER — FENTANYL CITRATE (PF) 100 MCG/2ML IJ SOLN: 25.0000 ug | INTRAMUSCULAR | Status: DC | PRN

## 2017-01-03 MED ORDER — METOCLOPRAMIDE HCL 10 MG PO TABS: 5.0000 mg | ORAL_TABLET | Freq: Three times a day (TID) | ORAL | Status: DC | PRN

## 2017-01-03 MED ORDER — SUCCINYLCHOLINE CHLORIDE 20 MG/ML IJ SOLN
INTRAMUSCULAR | Status: DC | PRN
  Administered 2017-01-03: 100 mg via INTRAVENOUS

## 2017-01-03 MED ORDER — BUPIVACAINE-EPINEPHRINE 0.5% -1:200000 IJ SOLN
INTRAMUSCULAR | Status: DC | PRN
  Administered 2017-01-03: 30 mL

## 2017-01-03 MED ORDER — MIDAZOLAM HCL 2 MG/2ML IJ SOLN
1.0000 mg | Freq: Once | INTRAMUSCULAR | Status: AC
  Administered 2017-01-03: 1 mg via INTRAVENOUS

## 2017-01-03 MED ORDER — GLYCOPYRROLATE 0.2 MG/ML IJ SOLN
INTRAMUSCULAR | Status: AC
  Filled 2017-01-03: qty 1

## 2017-01-03 MED ORDER — ROCURONIUM BROMIDE 100 MG/10ML IV SOLN
INTRAVENOUS | Status: DC | PRN
  Administered 2017-01-03: 50 mg via INTRAVENOUS
  Administered 2017-01-03 (×2): 10 mg via INTRAVENOUS

## 2017-01-03 MED ORDER — ROPIVACAINE HCL 5 MG/ML IJ SOLN
INTRAMUSCULAR | Status: AC
  Filled 2017-01-03: qty 40

## 2017-01-03 MED ORDER — CEFAZOLIN SODIUM-DEXTROSE 2-4 GM/100ML-% IV SOLN
INTRAVENOUS | Status: AC
  Filled 2017-01-03: qty 100

## 2017-01-03 MED ORDER — OXYCODONE HCL 5 MG/5ML PO SOLN: 5.0000 mg | Freq: Once | ORAL | Status: DC | PRN

## 2017-01-03 MED ORDER — MIDAZOLAM HCL 2 MG/2ML IJ SOLN
INTRAMUSCULAR | Status: AC
  Administered 2017-01-03: 1 mg via INTRAVENOUS
  Filled 2017-01-03: qty 2

## 2017-01-03 MED ORDER — FAMOTIDINE 20 MG PO TABS
ORAL_TABLET | ORAL | Status: AC
  Administered 2017-01-03: 20 mg via ORAL
  Filled 2017-01-03: qty 1

## 2017-01-03 MED ORDER — CEFAZOLIN SODIUM-DEXTROSE 2-4 GM/100ML-% IV SOLN
2.0000 g | Freq: Once | INTRAVENOUS | Status: AC
  Administered 2017-01-03: 2 g via INTRAVENOUS
  Administered 2017-01-03: 1 g via INTRAVENOUS

## 2017-01-03 MED ORDER — EPINEPHRINE PF 1 MG/ML IJ SOLN
INTRAMUSCULAR | Status: DC | PRN
  Administered 2017-01-03: 2 mL

## 2017-01-03 MED ORDER — FENTANYL CITRATE (PF) 100 MCG/2ML IJ SOLN
INTRAMUSCULAR | Status: DC | PRN
  Administered 2017-01-03 (×2): 100 ug via INTRAVENOUS
  Administered 2017-01-03: 50 ug via INTRAVENOUS

## 2017-01-03 SURGICAL SUPPLY — 46 items
ANCHOR JUGGERKNOT WTAP NDL 2.9 (Anchor) ×16 IMPLANT
ANCHOR SUT QUATTRO KNTLS 4.5 (Anchor) ×8 IMPLANT
BIT DRILL JUGRKNT W/NDL BIT2.9 (DRILL) ×2 IMPLANT
BLADE FULL RADIUS 3.5 (BLADE) ×4 IMPLANT
BUR ACROMIONIZER 4.0 (BURR) ×4 IMPLANT
CANNULA SHAVER 8MMX76MM (CANNULA) ×4 IMPLANT
CHLORAPREP W/TINT 26ML (MISCELLANEOUS) ×4 IMPLANT
COVER MAYO STAND STRL (DRAPES) ×4 IMPLANT
DRAPE IMP U-DRAPE 54X76 (DRAPES) ×8 IMPLANT
DRILL JUGGERKNOT W/NDL BIT 2.9 (DRILL) ×4
DRSG OPSITE POSTOP 4X8 (GAUZE/BANDAGES/DRESSINGS) ×4 IMPLANT
ELECT REM PT RETURN 9FT ADLT (ELECTROSURGICAL) ×4
ELECTRODE REM PT RTRN 9FT ADLT (ELECTROSURGICAL) ×2 IMPLANT
GAUZE PETRO XEROFOAM 1X8 (MISCELLANEOUS) ×4 IMPLANT
GAUZE SPONGE 4X4 12PLY STRL (GAUZE/BANDAGES/DRESSINGS) ×4 IMPLANT
GLOVE BIO SURGEON STRL SZ7.5 (GLOVE) ×8 IMPLANT
GLOVE BIO SURGEON STRL SZ8 (GLOVE) ×8 IMPLANT
GLOVE BIOGEL PI IND STRL 8 (GLOVE) ×2 IMPLANT
GLOVE BIOGEL PI INDICATOR 8 (GLOVE) ×2
GLOVE INDICATOR 8.0 STRL GRN (GLOVE) ×4 IMPLANT
GOWN STRL REUS W/ TWL LRG LVL3 (GOWN DISPOSABLE) ×2 IMPLANT
GOWN STRL REUS W/ TWL XL LVL3 (GOWN DISPOSABLE) ×2 IMPLANT
GOWN STRL REUS W/TWL LRG LVL3 (GOWN DISPOSABLE) ×2
GOWN STRL REUS W/TWL XL LVL3 (GOWN DISPOSABLE) ×2
GRASPER SUT 15 45D LOW PRO (SUTURE) IMPLANT
IV LACTATED RINGER IRRG 3000ML (IV SOLUTION) ×4
IV LR IRRIG 3000ML ARTHROMATIC (IV SOLUTION) ×4 IMPLANT
MANIFOLD NEPTUNE II (INSTRUMENTS) ×4 IMPLANT
MASK FACE SPIDER DISP (MASK) ×4 IMPLANT
MAT BLUE FLOOR 46X72 FLO (MISCELLANEOUS) ×4 IMPLANT
NDL MAYO CATGUT SZ5 (NEEDLE)
NDL SUT 5 .5 CRC TPR PNT MAYO (NEEDLE) IMPLANT
NEEDLE REVERSE CUT 1/2 CRC (NEEDLE) IMPLANT
PACK ARTHROSCOPY SHOULDER (MISCELLANEOUS) ×4 IMPLANT
SLING ARM LRG DEEP (SOFTGOODS) IMPLANT
SLING ULTRA II LG (MISCELLANEOUS) ×4 IMPLANT
STAPLER SKIN PROX 35W (STAPLE) ×4 IMPLANT
STRAP SAFETY BODY (MISCELLANEOUS) ×4 IMPLANT
SUT ETHIBOND 0 MO6 C/R (SUTURE) ×4 IMPLANT
SUT VIC AB 2-0 CT1 27 (SUTURE) ×4
SUT VIC AB 2-0 CT1 TAPERPNT 27 (SUTURE) ×4 IMPLANT
TAPE MICROFOAM 4IN (TAPE) ×4 IMPLANT
TUBING ARTHRO INFLOW-ONLY STRL (TUBING) ×4 IMPLANT
TUBING CONNECTING 10 (TUBING) ×3 IMPLANT
TUBING CONNECTING 10' (TUBING) ×1
WAND HAND CNTRL MULTIVAC 90 (MISCELLANEOUS) ×4 IMPLANT

## 2017-01-03 NOTE — H&P (Signed)
Paper H&P to be scanned into permanent record. H&P reviewed and patient re-examined. No changes. 

## 2017-01-03 NOTE — Transfer of Care (Signed)
Immediate Anesthesia Transfer of Care Note  Patient: Suzanne Dixon  Procedure(s) Performed: Procedure(s) with comments: SHOULDER ARTHROSCOPY WITH OPEN ROTATOR CUFF REPAIR (Right) SHOULDER ARTHROSCOPY WITH SUBACROMIAL DECOMPRESSION, ROTATOR CUFF REPAIR AND BICEP TENDON REPAIR (Right) - Limited debridement  Patient Location: PACU  Anesthesia Type:General  Level of Consciousness: awake, alert , oriented and patient cooperative  Airway & Oxygen Therapy: Patient Spontanous Breathing and Patient connected to face mask oxygen  Post-op Assessment: Report given to RN and Post -op Vital signs reviewed and stable  Post vital signs: Reviewed and stable  Last Vitals:  Vitals:   01/03/17 0720 01/03/17 0725  BP: (!) 158/107 (!) 155/93  Pulse: 63 (!) 32  Resp: (!) 23 (!) 21  Temp:      Last Pain:  Vitals:   01/03/17 0621  TempSrc: Tympanic         Complications: No apparent anesthesia complications

## 2017-01-03 NOTE — Anesthesia Procedure Notes (Signed)
Anesthesia Regional Block: Interscalene brachial plexus block   Pre-Anesthetic Checklist: ,, timeout performed, Correct Patient, Correct Site, Correct Laterality, Correct Procedure, Correct Position, site marked, Risks and benefits discussed,  Surgical consent,  Pre-op evaluation,  At surgeon's request and post-op pain management  Laterality: Right  Prep: chloraprep       Needles:  Injection technique: Single-shot  Needle Type: Stimiplex     Needle Length: 9cm  Needle Gauge: 22     Additional Needles:   Procedures: ultrasound guided,,,,,,,,  Narrative:  Start time: 01/03/2017 7:10 AM End time: 01/03/2017 7:20 AM Injection made incrementally with aspirations every 5 mL.  Performed by: Personally  Anesthesiologist: Tiffiany Beadles  Additional Notes: Functioning IV was confirmed and monitors were applied.  A 27mm 22ga Stimuplex needle was used. Sterile prep and drape,hand hygiene and sterile gloves were used.  Negative aspiration and negative test dose prior to incremental administration of local anesthetic. The patient tolerated the procedure well.

## 2017-01-03 NOTE — OR Nursing (Signed)
1200 Family instructed to give Metoprolol  200mg  po when she gets home.

## 2017-01-03 NOTE — Anesthesia Preprocedure Evaluation (Signed)
Anesthesia Evaluation  Patient identified by MRN, date of birth, ID band Patient awake    Reviewed: Allergy & Precautions, NPO status , Patient's Chart, lab work & pertinent test results  History of Anesthesia Complications Negative for: history of anesthetic complications  Airway Mallampati: III  TM Distance: >3 FB Neck ROM: Full    Dental no notable dental hx.    Pulmonary sleep apnea and Continuous Positive Airway Pressure Ventilation , neg COPD,    breath sounds clear to auscultation- rhonchi (-) wheezing      Cardiovascular hypertension, Pt. on medications (-) CAD and (-) Past MI + dysrhythmias (paroxysmal afib) Atrial Fibrillation  Rhythm:Regular Rate:Normal - Systolic murmurs and - Diastolic murmurs    Neuro/Psych PSYCHIATRIC DISORDERS Depression negative neurological ROS     GI/Hepatic Neg liver ROS, GERD  ,  Endo/Other  neg diabetesHypothyroidism   Renal/GU negative Renal ROS     Musculoskeletal  (+) Arthritis ,   Abdominal (+) + obese,   Peds  Hematology negative hematology ROS (+)   Anesthesia Other Findings Past Medical History: No date: B12 deficiency 03/31/2013: Breast cancer ([place])     Comment: Right - chemo- mastectomy 1991: Breast cancer ([place])     Comment: Rt.- radiation No date: DDD (degenerative disc disease), cervical No date: Depression No date: Edema of both legs No date: GERD (gastroesophageal reflux disease) No date: Gout 2014: H/O mastectomy     Comment: per patient 29: H/O: cesarean section     Comment: per patient report 67: H/O: hysterectomy     Comment: per patient No date: Hydradenitis No date: Hypertension No date: Hypothyroidism No date: Neuropathy 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: Ruptured cervical disc     Comment: per patient  No date: Sleep apnea No date: Tachycardia   Reproductive/Obstetrics                              Anesthesia Physical Anesthesia Plan  ASA: III  Anesthesia Plan: General   Post-op Pain Management:  Regional for Post-op pain   Induction: Intravenous  Airway Management Planned: Oral ETT  Additional Equipment:   Intra-op Plan:   Post-operative Plan: Extubation in OR  Informed Consent: I have reviewed the patients History and Physical, chart, labs and discussed the procedure including the risks, benefits and alternatives for the proposed anesthesia with the patient or authorized representative who has indicated his/her understanding and acceptance.   Dental advisory given  Plan Discussed with: CRNA and Anesthesiologist  Anesthesia Plan Comments:         Anesthesia Quick Evaluation

## 2017-01-03 NOTE — Anesthesia Procedure Notes (Signed)
Procedure Name: Intubation Date/Time: 01/03/2017 7:41 AM Performed by: Rosaria Ferries, Kimari Lienhard Pre-anesthesia Checklist: Patient identified, Emergency Drugs available, Suction available and Patient being monitored Patient Re-evaluated:Patient Re-evaluated prior to inductionOxygen Delivery Method: Circle system utilized Preoxygenation: Pre-oxygenation with 100% oxygen Intubation Type: IV induction Laryngoscope Size: Mac, 3 and McGraph (Grade 1 with McGraph) Grade View: Grade III Tube size: 7.0 mm Number of attempts: 1 Airway Equipment and Method: Bougie stylet Placement Confirmation: ETT inserted through vocal cords under direct vision,  positive ETCO2 and breath sounds checked- equal and bilateral Secured at: 22 cm Tube secured with: Tape Difficulty Due To: Difficulty was anticipated and Difficult Airway- due to anterior larynx Future Recommendations: Recommend- induction with short-acting agent, and alternative techniques readily available

## 2017-01-03 NOTE — Anesthesia Post-op Follow-up Note (Cosign Needed)
Anesthesia QCDR form completed.        

## 2017-01-03 NOTE — Discharge Instructions (Addendum)
Keep dressing dry and intact.  May shower after dressing changed on post-op day #4 (Saturday).  Cover staples/sutures with Band-Aids after drying off. Apply ice frequently to shoulder. Take oxycodone as prescribed when needed. May alternate with Tramadol.  May supplement with ES Tylenol if necessary. Keep shoulder immobilizer on at all times except may remove for bathing purposes. Follow-up in 10-14 days or as scheduled.   AMBULATORY SURGERY  DISCHARGE INSTRUCTIONS   1) The drugs that you were given will stay in your system until tomorrow so for the next 24 hours you should not:  A) Drive an automobile B) Make any legal decisions C) Drink any alcoholic beverage   2) You may resume regular meals tomorrow.  Today it is better to start with liquids and gradually work up to solid foods.  You may eat anything you prefer, but it is better to start with liquids, then soup and crackers, and gradually work up to solid foods.   3) Please notify your doctor immediately if you have any unusual bleeding, trouble breathing, redness and pain at the surgery site, drainage, fever, or pain not relieved by medication.    4) Additional Instructions:        Please contact your physician with any problems or Same Day Surgery at [phone], Monday through Friday 6 am to 4 pm, or [place] [facility] number at [phone].

## 2017-01-03 NOTE — Anesthesia Postprocedure Evaluation (Signed)
Anesthesia Post Note  Patient: Suzanne Dixon  Procedure(s) Performed: Procedure(s) (LRB): SHOULDER ARTHROSCOPY WITH OPEN ROTATOR CUFF REPAIR (Right) SHOULDER ARTHROSCOPY WITH SUBACROMIAL DECOMPRESSION, ROTATOR CUFF REPAIR AND BICEP TENDON REPAIR (Right)  Patient location during evaluation: PACU Anesthesia Type: General Level of consciousness: awake and alert and oriented Pain management: pain level controlled Vital Signs Assessment: post-procedure vital signs reviewed and stable Respiratory status: spontaneous breathing, nonlabored ventilation and respiratory function stable Cardiovascular status: blood pressure returned to baseline and stable Postop Assessment: no signs of nausea or vomiting Anesthetic complications: no     Last Vitals:  Vitals:   01/03/17 1039 01/03/17 1051  BP: (!) 166/95 (!) 176/102  Pulse: 68 66  Resp: 20 16  Temp: 36.5 C 36.1 C    Last Pain:  Vitals:   01/03/17 1051  TempSrc: Temporal  PainSc: 0-No pain                 Aaryav Hopfensperger

## 2017-01-03 NOTE — Op Note (Signed)
01/03/2017  9:41 AM  Patient:   Suzanne Dixon  Pre-Op Diagnosis:   Impingement/tendinopathy with large rotator cuff tear and biceps tendinopathy, right shoulder.  Post-Op Diagnosis: Impingement/tendinopathy with massive rotator cuff tear and biceps tendinopathy, right shoulder.  Procedure: Limited arthroscopic debridement, arthroscopic subacromial decompression, mini-open repair of massive rotator cuff tear, and mini-open biceps tenodesis, right shoulder.  Anesthesia: General endotracheal with interscalene block placed preoperatively by the anesthesiologist.  Surgeon:   Pascal Lux, MD  Assistant:   Cameron Proud, PA-C  Findings: As above. The rotator cuff tear involved the entire insertion of both the supraspinatus and infraspinatus tendons with retraction back to the glenoid. There was significant tendinopathy Tearing of the long head of the biceps tendon. There were diffuse grade 1-2 chondromalacial changes of the glenoid with focal grade 3 changes centrally. The humeral head also demonstrated diffuse grade 1 chondromalacial changes. Both the subscapularis and infraspinatus tendons were in satisfactory condition. There was some labral fraying but no frank detachment of the labrum from the glenoid.  Complications: None  Fluids:   1000 cc  Estimated blood loss: 10 cc  Tourniquet time: None  Drains: None  Closure: Staples   Brief clinical note: The patient is a 66 year old female with a two month history of right shoulder pain following a fall. The patient's symptoms have progressed despite medications, activity modification, etc. The patient's history and examination are consistent with impingement/tendinopathy with a rotator cuff tear. These findings were confirmed by MRI scan. The patient presents at this time for definitive management of these shoulder symptoms.  Procedure: The patient underwent placement of an interscalene block by the  anesthesiologist in the preoperative holding area before being brought into the operating room and lain in the supine position. The patient then underwent general endotracheal intubation and anesthesia before being repositioned in the beach chair position using the beach chair positioner. The right shoulder and upper extremity were prepped with ChloraPrep solution before being draped sterilely. Preoperative antibiotics were administered. A timeout was performed to confirm the proper surgical site before the expected portal sites and incision site were injected with 0.5% Sensorcaine with epinephrine. A posterior portal was created and the glenohumeral joint thoroughly inspected with the findings as described above. An anterior portal was created using an outside-in technique. The labrum and rotator cuff were further probed, again confirming the above-noted findings. Areas of synovitis and as well as the torn margins of the rotator cuff tear were debrided back to stable margins using the full-radius resector. The ArthroCare wand was inserted and used to release the biceps tendon from its labral anchor as well as to "anneal" the labrum superiorly and anteriorly. The instruments were removed from the joint after suctioning the excess fluid.  The camera was repositioned through the posterior portal into the subacromial space. A separate lateral portal was created using an outside-in technique. The 3.5 mm full-radius resector was introduced and used to perform a subtotal bursectomy. The ArthroCare wand was then inserted and used to remove the periosteal tissue off the undersurface of the anterior third of the acromion as well as to recess the coracoacromial ligament from its attachment along the anterior and lateral margins of the acromion. The 4.0 mm acromionizing bur was introduced and used to complete the decompression by removing the undersurface of the anterior third of the acromion. The full radius resector was  reintroduced to remove any residual bony debris before the ArthroCare wand was reintroduced to obtain hemostasis. The instruments  were then removed from the subacromial space after suctioning the excess fluid.  An approximately 4-5 cm incision was made over the anterolateral aspect of the shoulder beginning at the anterolateral corner of the acromion and extending distally in line with the bicipital groove. This incision was carried down through the subcutaneous tissues to expose the deltoid fascia. The raphae between the anterior and middle thirds was identified and this plane developed to provide access into the subacromial space. Additional bursal tissues were debrided sharply using Metzenbaum scissors, mobilizing the rotator cuff in the process. The rotator cuff tear was readily identified. The margins were debrided sharply with a #15 blade and the exposed greater tuberosity roughened with a rongeur. The tear was repaired using three Biomet 2.9 mm JuggerKnot anchors. These sutures were then brought back laterally and secured using two [place] QuatroLink anchors to create a two-layer closure. In addition, several side-to-side #0 Ethibond interrupted sutures were placed in the area of the rotator interval to reinforce the repair. An apparent watertight closure was obtained.  The bicipital groove was identified by palpation and opened for 1-1.5 cm. The biceps tendon stump was retrieved through this defect. The floor of the bicipital groove was roughened with a curet before another Biomet 2.9 mm JuggerKnot anchor was inserted. Both sets of sutures were passed through the biceps tendon and tied securely to effect the tenodesis. The bicipital sheath was reapproximated using two #0 Ethibond interrupted sutures, incorporating the biceps tendon to further reinforce the tenodesis.  The wound was copiously irrigated with sterile saline solution before the deltoid raphae was reapproximated using 2-0 Vicryl interrupted  sutures. The subcutaneous tissues were closed in two layers using 2-0 Vicryl interrupted sutures before the skin was closed using staples. The portal sites also were closed using staples. A sterile bulky dressing was applied to the shoulder before the arm was placed into a shoulder immobilizer. The patient was then awakened, extubated, and returned to the recovery room in satisfactory condition after tolerating the procedure well.

## 2017-02-21 ENCOUNTER — Ambulatory Visit
Admission: RE | Admit: 2017-02-21 | Discharge: 2017-02-21 | Disposition: A | Discharge status: Home / Self Care | Payer: Medicare HMO | Source: Ambulatory Visit | Attending: Internal Medicine | Admitting: Internal Medicine

## 2017-02-21 DIAGNOSIS — Z853 Personal history of malignant neoplasm of breast: Secondary | ICD-10-CM | POA: Diagnosis not present

## 2017-02-21 DIAGNOSIS — Z1231 Encounter for screening mammogram for malignant neoplasm of breast: Secondary | ICD-10-CM | POA: Diagnosis not present

## 2017-04-04 ENCOUNTER — Inpatient Hospital Stay: Payer: Medicare HMO

## 2017-04-04 ENCOUNTER — Inpatient Hospital Stay: Payer: Medicare HMO | Attending: Oncology | Admitting: Oncology

## 2017-04-04 VITALS — BP 179/112 | HR 87 | Temp 96.4°F | Resp 20 | Wt 324.1 lb

## 2017-04-04 DIAGNOSIS — R5381 Other malaise: Secondary | ICD-10-CM | POA: Diagnosis not present

## 2017-04-04 DIAGNOSIS — K219 Gastro-esophageal reflux disease without esophagitis: Secondary | ICD-10-CM | POA: Diagnosis not present

## 2017-04-04 DIAGNOSIS — Z803 Family history of malignant neoplasm of breast: Secondary | ICD-10-CM | POA: Insufficient documentation

## 2017-04-04 DIAGNOSIS — C91Z0 Other lymphoid leukemia not having achieved remission: Secondary | ICD-10-CM | POA: Diagnosis not present

## 2017-04-04 DIAGNOSIS — Z79899 Other long term (current) drug therapy: Secondary | ICD-10-CM | POA: Diagnosis not present

## 2017-04-04 DIAGNOSIS — R5383 Other fatigue: Secondary | ICD-10-CM | POA: Diagnosis not present

## 2017-04-04 DIAGNOSIS — M5030 Other cervical disc degeneration, unspecified cervical region: Secondary | ICD-10-CM | POA: Diagnosis not present

## 2017-04-04 DIAGNOSIS — E039 Hypothyroidism, unspecified: Secondary | ICD-10-CM | POA: Diagnosis not present

## 2017-04-04 DIAGNOSIS — Z853 Personal history of malignant neoplasm of breast: Secondary | ICD-10-CM | POA: Diagnosis not present

## 2017-04-04 DIAGNOSIS — E538 Deficiency of other specified B group vitamins: Secondary | ICD-10-CM | POA: Diagnosis not present

## 2017-04-04 DIAGNOSIS — Z171 Estrogen receptor negative status [ER-]: Secondary | ICD-10-CM | POA: Diagnosis not present

## 2017-04-04 DIAGNOSIS — G4730 Sleep apnea, unspecified: Secondary | ICD-10-CM | POA: Insufficient documentation

## 2017-04-04 DIAGNOSIS — R918 Other nonspecific abnormal finding of lung field: Secondary | ICD-10-CM | POA: Diagnosis not present

## 2017-04-04 DIAGNOSIS — Z9221 Personal history of antineoplastic chemotherapy: Secondary | ICD-10-CM | POA: Insufficient documentation

## 2017-04-04 DIAGNOSIS — M109 Gout, unspecified: Secondary | ICD-10-CM | POA: Diagnosis not present

## 2017-04-04 DIAGNOSIS — Z9011 Acquired absence of right breast and nipple: Secondary | ICD-10-CM | POA: Diagnosis not present

## 2017-04-04 DIAGNOSIS — Z7901 Long term (current) use of anticoagulants: Secondary | ICD-10-CM

## 2017-04-04 DIAGNOSIS — I10 Essential (primary) hypertension: Secondary | ICD-10-CM | POA: Diagnosis not present

## 2017-04-04 DIAGNOSIS — C50911 Malignant neoplasm of unspecified site of right female breast: Secondary | ICD-10-CM

## 2017-04-04 LAB — CBC WITH DIFFERENTIAL/PLATELET
Basophils Absolute: 0.1 10*3/uL (ref 0–0.1)
Basophils Relative: 1 %
EOS PCT: 1 %
Eosinophils Absolute: 0.1 10*3/uL (ref 0–0.7)
HCT: 36.5 % (ref 35.0–47.0)
Hemoglobin: 12.7 g/dL (ref 12.0–16.0)
LYMPHS ABS: 3.8 10*3/uL — AB (ref 1.0–3.6)
LYMPHS PCT: 39 %
MCH: 31.8 pg (ref 26.0–34.0)
MCHC: 34.8 g/dL (ref 32.0–36.0)
MCV: 91.3 fL (ref 80.0–100.0)
MONO ABS: 0.8 10*3/uL (ref 0.2–0.9)
Monocytes Relative: 8 %
Neutro Abs: 5 10*3/uL (ref 1.4–6.5)
Neutrophils Relative %: 51 %
PLATELETS: 313 10*3/uL (ref 150–440)
RBC: 4 MIL/uL (ref 3.80–5.20)
RDW: 14.4 % (ref 11.5–14.5)
WBC: 9.9 10*3/uL (ref 3.6–11.0)

## 2017-04-04 NOTE — Progress Notes (Signed)
Recheck b/p 132/78 p-88.

## 2017-04-04 NOTE — Progress Notes (Signed)
Here for follow up  BP elevated -will repeat

## 2017-04-06 ENCOUNTER — Encounter: Payer: Self-pay | Admitting: Oncology

## 2017-04-06 NOTE — Progress Notes (Signed)
Hematology/Oncology Consult note [facility]  Telephone:([phone] [phone]  Patient Care Team: Adin Hector, MD as PCP - General (Internal Medicine)   Name of the patient: Suzanne Dixon  [id number]  1951/09/17   Date of visit: 04/06/17  Diagnosis-1. 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 HIV, SPEP and CT/PET for 2012 reported as negative. Right breast cancer diagnosed in 2014, ER/PR negative HER-2/neu negative, BRCA negative. Patient is status post right mastectomy following diagnosis of infiltrating ductal carcinoma. Patient completed 6 cycles of FEC in 2014. Patient had subsequent right arm lymphedema. Patient also has significant history for benign pulmonary nodules noted on CT scan from 2013, that have remained stable. Last CT March 2015.  Test Ordered: [id number] T Cell Panel (gamma, beta)  T-Cell PCR Interpretation   Comment          YU   POSITIVE: A clonal T-cell receptor gamma (TCRG) population was      detected  Interpretation of this result should be made in the context of other  clinical, morphologic, and immunophenotypic findings. The presence  of a monoclonal gene rearrangement usually reflects the presence of a  T-lymphocytic neoplasm. Occasionally, B-cell neoplasms may  demonstrate "lineage infidelity", i.e. rearranged T-cell receptor  genes. Other possible reasons for detecting a clonal T-cell  population include transient clonal proliferations due to certain  viral infections or drug-associated cutaneous eruptions associated  with dermal lymphoid  proliferation processes. Clonal T-cell receptor  gene rearrangements have also been described in other non-malignant  disorders including lymphomatoid papulosis and large granular  lymphocytosis.    Interval history- appetite is good. Denies any unintentional weight loss or pain  ECOG PS- 1 Pain scale- 0   Review of systems- Review of Systems  Constitutional: Positive for malaise/fatigue. Negative for chills, fever and weight loss.  HENT: Negative for congestion, ear discharge and nosebleeds.   Eyes: Negative for blurred vision.  Respiratory: Negative for cough, hemoptysis, sputum production, shortness of breath and wheezing.   Cardiovascular: Negative for chest pain, palpitations, orthopnea and claudication.  Gastrointestinal: Negative for abdominal pain, blood in stool, constipation, diarrhea, heartburn, melena, nausea and vomiting.  Genitourinary: Negative for dysuria, flank pain, frequency, hematuria and urgency.  Musculoskeletal: Negative for back pain, joint pain and myalgias.  Skin: Negative for rash.  Neurological: Negative for dizziness, tingling, focal weakness, seizures, weakness and headaches.  Endo/Heme/Allergies: Does not bruise/bleed easily.  Psychiatric/Behavioral: Negative for depression and suicidal ideas. The patient does not have insomnia.      Allergies  Allergen Reactions  . Advair Diskus [Fluticasone-Salmeterol] Other (See Comments)    Joint pain   . Singulair [Montelukast] Other (See Comments)    "muscle pain"  . Sulfa Antibiotics Hives  . Venlafaxine Other (See Comments)    "altered mental status/tremors"     Past Medical History:  Diagnosis Date  . B12 deficiency   . Breast cancer ([place]) 03/31/2013   Right - chemo- mastectomy  . Breast cancer ([place]) 1991   Rt.- radiation  . DDD (degenerative disc disease), cervical   . Depression   . Edema of both legs   . GERD (gastroesophageal reflux disease)   .  Gout   . H/O mastectomy 2014   per patient    . H/O: cesarean section 1987   per patient report  . H/O: hysterectomy 1996   per patient  . Hydradenitis   . Hypertension   . Hypothyroidism   . Neuropathy   . 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 Ruptured cervical disc 2002   per patient   . Sleep apnea   . Tachycardia      Past Surgical History:  Procedure Laterality Date  . ABDOMINAL HYSTERECTOMY    . BACK SURGERY  2002   plate and 2 screws in neck  . BREAST BIOPSY Right 1991,2014   Positive  . BREAST CYST ASPIRATION Left   . BREAST SURGERY Right 2014   mastectomy  . CESAREAN SECTION    . CHOLECYSTECTOMY    . COLONOSCOPY WITH PROPOFOL N/A 06/22/2015   Procedure: COLONOSCOPY WITH PROPOFOL;  Surgeon: Manya Silvas, MD;  Location: [facility] ENDOSCOPY;  Service: Endoscopy;  Laterality: N/A;  . ESOPHAGOGASTRODUODENOSCOPY    . LAPAROSCOPIC GASTRIC BYPASS    . MASTECTOMY Right   . NEUROPLASTY / TRANSPOSITION ULNAR NERVE AT ELBOW    . PORT-A-CATH REMOVAL Left 05/13/2015   Procedure: REMOVAL PORT-A-CATH LEFT CHEST ;  Surgeon: Florene Glen, MD;  Location: [facility] ORS;  Service: General;  Laterality: Left;  . REDUCTION MAMMAPLASTY    . SHOULDER ARTHROSCOPY WITH OPEN ROTATOR CUFF REPAIR Right 01/03/2017   Procedure: SHOULDER ARTHROSCOPY WITH OPEN ROTATOR CUFF REPAIR;  Surgeon: Corky Mull, MD;  Location: [facility] ORS;  Service: Orthopedics;  Laterality: Right;  . SHOULDER ARTHROSCOPY WITH SUBACROMIAL DECOMPRESSION, ROTATOR CUFF REPAIR AND BICEP TENDON REPAIR Right 01/03/2017   Procedure: SHOULDER ARTHROSCOPY WITH SUBACROMIAL DECOMPRESSION, ROTATOR CUFF REPAIR AND BICEP TENDON REPAIR;  Surgeon: Corky Mull, MD;  Location: [facility] ORS;  Service: Orthopedics;  Laterality: Right;  Limited debridement    Social History   Social History  . Marital status: Divorced    Spouse name: N/A  . Number of children: N/A  . Years of education: N/A   Occupational History  . Not on file.   Social History Main Topics  . Smoking status: Never Smoker   . Smokeless tobacco: Never Used  . Alcohol use No  . Drug use: No  . Sexual activity: Not on file   Other Topics Concern  . Not on file   Social History Narrative  . No narrative on file    Family History  Problem Relation Age of Onset  . Hypertension Mother   . Lymphoma Mother   . Cancer Sister        Breast  . Breast cancer Sister 22     Current Outpatient Prescriptions:  .  allopurinol (ZYLOPRIM) 300 MG tablet, Take 300 mg by mouth daily. , Disp: , Rfl:  .  Calcium Carb-Cholecalciferol (SM CALCIUM-VITAMIN D) 600-400 MG-UNIT TABS, Take 1 tablet by mouth 2 (two) times daily. , Disp: , Rfl:  .  Cyanocobalamin (RA VITAMIN B-12 TR) 1000 MCG TBCR, Take 1 tablet by mouth daily. , Disp: , Rfl:  .  cyclobenzaprine (FLEXERIL) 10 MG tablet, Take by mouth., Disp: , Rfl:  .  diclofenac sodium (VOLTAREN) 1 % GEL, Apply 4 g topically 3 (three) times daily as needed (joint pain). , Disp: , Rfl:  .  DULoxetine (CYMBALTA) 30 MG capsule, Take 30-60 mg by mouth 2 (two) times daily. 30 mg in the morning and 60 mg at bedtime, Disp: , Rfl:  .  fluticasone (FLONASE) 50 MCG/ACT nasal spray, Place 2 sprays into both nostrils daily., Disp: , Rfl:  .  gabapentin (NEURONTIN) 300 MG capsule, Take 300 mg by mouth 3 (three) times daily. 300 mg and a 600 mg to equal 900 mg three times daily, Disp: , Rfl:  .  gabapentin (NEURONTIN) 600 MG tablet, Take 600 mg by mouth 3 (three) times daily. Takes a 300 mg and 600 mg to equal 900 mg three times daily, Disp: , Rfl: 11 .  levothyroxine (SYNTHROID, LEVOTHROID) 50 MCG tablet, Take 50 mcg by mouth daily before breakfast., Disp: , Rfl:  .  MAGNESIUM PO, Take 350 mg by mouth daily., Disp: , Rfl:  .  meloxicam (MOBIC) 15 MG tablet, Take 15 mg by mouth daily., Disp: , Rfl:  .  metoprolol (LOPRESSOR) 100 MG tablet, Take 200 mg by mouth 2 (two) times daily. Takes 2 tablets, Disp: , Rfl:  .  Multiple Vitamins tablet, Take 2 tablets by mouth daily. , Disp: , Rfl:  .   omeprazole (PRILOSEC) 20 MG capsule, Take 20 mg by mouth daily. , Disp: , Rfl:  .  potassium chloride (K-DUR,KLOR-CON) 10 MEQ tablet, Take 10 mEq by mouth 3 (three) times daily. , Disp: , Rfl:  .  PRESCRIPTION MEDICATION, APPLY FROM NECK DOWN TWICE DAILY (PHARMACY MIX 1:1 WITH CERAVE CREAM), Disp: , Rfl: 0 .  triamcinolone ointment (KENALOG) 0.1 %, Apply 1 application topically 2 (two) times daily. To body, Disp: , Rfl:  .  warfarin (COUMADIN) 1 MG tablet, Take 1 mg by mouth daily at 6 PM. Takes 5 mg and  1 mg to equal 6 mg daily, Disp: , Rfl:  .  warfarin (COUMADIN) 5 MG tablet, Take 5 mg by mouth daily at 6 PM. Takes a 5 mg and 1 mg to equal 6 mg daily, Disp: , Rfl:  .  azelastine (ASTELIN) 0.1 % nasal spray, Instill 1 spray into each nostril twice daily as needed for congestion, Disp: , Rfl:  .  clindamycin (CLEOCIN T) 1 % SWAB, Apply 1 application topically daily., Disp: , Rfl:  .  diphenhydrAMINE (ALLERGY RELIEF) 25 mg capsule, Take 25 mg by mouth 2 (two) times daily., Disp: , Rfl:  .  HYDROcodone-acetaminophen (NORCO/VICODIN) 5-325 MG tablet, Take 1 tablet by mouth every 6 (six) hours as needed. for pain, Disp: , Rfl: 0 .  nystatin-triamcinolone (MYCOLOG II) cream, Apply 1 application topically as needed., Disp: , Rfl:  .  oxyCODONE (ROXICODONE) 5 MG immediate release tablet, Take 1-2 tablets (5-10 mg total) by mouth every 4 (four) hours as needed for severe pain. (Patient not taking: Reported on 04/04/2017), Disp: 50 tablet, Rfl: 0 .  traMADol (ULTRAM) 50 MG tablet, Take 50 mg by mouth every 4 (four) hours as needed for moderate pain (every 4-6 hours as needed). , Disp: , Rfl:   Physical exam:  Vitals:   04/04/17 1201  BP: (!) 179/112  Pulse: 87  Resp: 20  Temp: (!) 96.4 F (35.8 C)  TempSrc: Tympanic  Weight: (!) 324 lb 1.6 oz (147 kg)   Physical Exam  Constitutional: She is oriented to person, place, and time.  Morbidly obese. Appears in no acute distress  HENT:  Head:  Normocephalic and atraumatic.  Eyes: Pupils are equal, round, and reactive to light. EOM are normal.  Neck: Normal range of motion.  Cardiovascular: Normal rate, regular rhythm and normal heart sounds.   Pulmonary/Chest: Effort normal and breath sounds normal.  Abdominal: Soft. Bowel  sounds are normal.  Musculoskeletal: She exhibits edema.  Neurological: She is alert and oriented to person, place, and time.  Skin: Skin is warm and dry.   Breast exam is performed in seated and lying down position. Patient is status post right mastectomy without reconstruction. There is no evidence of any chest wall recurrence. No evidence of bilateral axillary adenopathy. No palpable masses in the left breast   CMP Latest Ref Rng & Units 05/05/2015  Glucose 65 - 99 mg/dL 66  BUN 6 - 20 mg/dL 9  Creatinine 0.44 - 1.00 mg/dL 0.75  Sodium 135 - 145 mmol/L 141  Potassium 3.5 - 5.1 mmol/L 4.4  Chloride 101 - 111 mmol/L 103  CO2 22 - 32 mmol/L 29  Calcium 8.9 - 10.3 mg/dL 8.9  Total Protein 6.4 - 8.2 g/dL -  Total Bilirubin 0.2 - 1.0 mg/dL -  Alkaline Phos 50 - 136 Unit/L -  AST 15 - 37 Unit/L -  ALT 12 - 78 U/L -   CBC Latest Ref Rng & Units 04/04/2017  WBC 3.6 - 11.0 K/uL 9.9  Hemoglobin 12.0 - 16.0 g/dL 12.7  Hematocrit 35.0 - 47.0 % 36.5  Platelets 150 - 440 K/uL 313     Assessment and plan- Patient is a 66 y.o. female who sees [place] for following medical issues:  1. Triple negative right breast cancer in 2014 status post mastectomy and adjuvant chemotherapy. Clinically she is doing well and there is no evidence of recurrence on exam today. Recent mammogram from June 2018 did not reveal any malignancy in left breast. Repeat ammmogram in 1 year  2. LGL- noted on flow cytometry for workup done for lymphocytosis. CBC shows chronic stable lymphocytosis. No B symptoms. Continue to monitor  RTC in 6 months to see me. No labs     Visit Diagnosis 1. 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 Randa Evens, MD, MPH [place] at [facility] Pager- [phone] 04/06/2017 8:03 AM

## 2017-10-05 ENCOUNTER — Inpatient Hospital Stay: Payer: Medicare HMO | Attending: Oncology | Admitting: Oncology

## 2017-10-05 ENCOUNTER — Encounter: Payer: Self-pay | Admitting: Oncology

## 2017-10-05 VITALS — BP 154/95 | HR 72 | Temp 98.2°F | Resp 24 | Wt 320.0 lb

## 2017-10-05 DIAGNOSIS — D72820 Lymphocytosis (symptomatic): Secondary | ICD-10-CM

## 2017-10-05 DIAGNOSIS — Z853 Personal history of malignant neoplasm of breast: Secondary | ICD-10-CM | POA: Diagnosis present

## 2017-10-05 DIAGNOSIS — C91Z0 Other lymphoid leukemia not having achieved remission: Secondary | ICD-10-CM

## 2017-10-05 DIAGNOSIS — Z9011 Acquired absence of right breast and nipple: Secondary | ICD-10-CM

## 2017-10-05 DIAGNOSIS — C50911 Malignant neoplasm of unspecified site of right female breast: Secondary | ICD-10-CM

## 2017-10-05 DIAGNOSIS — Z171 Estrogen receptor negative status [ER-]: Secondary | ICD-10-CM

## 2017-10-05 NOTE — Progress Notes (Signed)
Hematology/Oncology Consult note [facility]  Telephone:([phone] [phone]  Patient Care Team: Adin Hector, MD as PCP - General (Internal Medicine)   Name of the patient: Suzanne Dixon  [id number]  February 28, 1951   Date of visit: 10/05/17  Diagnosis-1. 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 HIV, SPEP and CT/PET for 2012 reported as negative. Right breast cancer diagnosed in 2014, ER/PR negative HER-2/neu negative, BRCA negative. Patient is status post right mastectomy following diagnosis of infiltrating ductal carcinoma. Patient completed 6 cycles of FEC in 2014. Patient hadsubsequent right arm lymphedema. Patient also has significant history for benign pulmonary nodules noted on CT scan from 2013, that have remained stable. Last CT March 2015.  Test Ordered: [id number] T Cell Panel (gamma, beta)  T-Cell PCR Interpretation   Comment          YU   POSITIVE: A clonal T-cell receptor gamma (TCRG) population was      detected  Interpretation of this result should be made in the context of other  clinical, morphologic, and immunophenotypic findings. The presence  of a monoclonal gene rearrangement usually reflects the presence of a  T-lymphocytic neoplasm. Occasionally, B-cell neoplasms may  demonstrate "lineage infidelity", i.e. rearranged T-cell receptor  genes. Other possible reasons for detecting a clonal T-cell  population include transient clonal proliferations due to certain  viral infections or drug-associated cutaneous eruptions associated  with dermal lymphoid  proliferation processes. Clonal T-cell receptor  gene rearrangements have also been described in other non-malignant  disorders including lymphomatoid papulosis and large granular  lymphocytosis.     Interval history-patient reports mild chronic fatigue.  She occasionally has pain and swelling especially in the medial aspect of her right hand which comes and goes.  She denies any unintentional weight loss or new aches or pains anywhere.  Her appetite is good  ECOG PS- 2 Pain scale- 4   Review of systems- Review of Systems  Constitutional: Positive for malaise/fatigue. Negative for chills, fever and weight loss.  HENT: Negative for congestion, ear discharge and nosebleeds.   Eyes: Negative for blurred vision.  Respiratory: Negative for cough, hemoptysis, sputum production, shortness of breath and wheezing.   Cardiovascular: Negative for chest pain, palpitations, orthopnea and claudication.  Gastrointestinal: Negative for abdominal pain, blood in stool, constipation, diarrhea, heartburn, melena, nausea and vomiting.  Genitourinary: Negative for dysuria, flank pain, frequency, hematuria and urgency.  Musculoskeletal: Negative for back pain and myalgias.  Skin: Negative for rash.  Neurological: Negative for dizziness, tingling, focal weakness, seizures, weakness and headaches.  Endo/Heme/Allergies: Does not bruise/bleed easily.  Psychiatric/Behavioral: Negative for depression and suicidal ideas. The patient does not have insomnia.      Allergies  Allergen Reactions  . Advair Diskus [Fluticasone-Salmeterol] Other (See Comments)    Joint pain   . Singulair [Montelukast] Other (See Comments)    "muscle pain"  . Sulfa Antibiotics Hives  . Venlafaxine Other (See Comments)    "altered mental status/tremors"     Past Medical History:  Diagnosis Date  . B12 deficiency   . Breast cancer ([place]) 03/31/2013   Right - chemo- mastectomy  . Breast cancer ([place]) 1991   Rt.-  radiation  . DDD  (degenerative disc disease), cervical   . Depression   . Edema of both legs   . GERD (gastroesophageal reflux disease)   . Gout   . H/O mastectomy 2014   per patient  . H/O: cesarean section 1987   per patient report  . H/O: hysterectomy 1996   per patient  . Hydradenitis   . Hypertension   . Hypothyroidism   . Neuropathy   . 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 Ruptured cervical disc 2002   per patient   . Sleep apnea   . Tachycardia      Past Surgical History:  Procedure Laterality Date  . ABDOMINAL HYSTERECTOMY    . BACK SURGERY  2002   plate and 2 screws in neck  . BREAST BIOPSY Right 1991,2014   Positive  . BREAST CYST ASPIRATION Left   . BREAST SURGERY Right 2014   mastectomy  . CESAREAN SECTION    . CHOLECYSTECTOMY    . COLONOSCOPY WITH PROPOFOL N/A 06/22/2015   Procedure: COLONOSCOPY WITH PROPOFOL;  Surgeon: Manya Silvas, MD;  Location: [facility] ENDOSCOPY;  Service: Endoscopy;  Laterality: N/A;  . ESOPHAGOGASTRODUODENOSCOPY    . LAPAROSCOPIC GASTRIC BYPASS    . MASTECTOMY Right   . NEUROPLASTY / TRANSPOSITION ULNAR NERVE AT ELBOW    . PORT-A-CATH REMOVAL Left 05/13/2015   Procedure: REMOVAL PORT-A-CATH LEFT CHEST ;  Surgeon: Florene Glen, MD;  Location: [facility] ORS;  Service: General;  Laterality: Left;  . REDUCTION MAMMAPLASTY    . SHOULDER ARTHROSCOPY WITH OPEN ROTATOR CUFF REPAIR Right 01/03/2017   Procedure: SHOULDER ARTHROSCOPY WITH OPEN ROTATOR CUFF REPAIR;  Surgeon: Corky Mull, MD;  Location: [facility] ORS;  Service: Orthopedics;  Laterality: Right;  . SHOULDER ARTHROSCOPY WITH SUBACROMIAL DECOMPRESSION, ROTATOR CUFF REPAIR AND BICEP TENDON REPAIR Right 01/03/2017   Procedure: SHOULDER ARTHROSCOPY WITH SUBACROMIAL DECOMPRESSION, ROTATOR CUFF REPAIR AND BICEP TENDON REPAIR;  Surgeon: Corky Mull, MD;  Location: [facility] ORS;  Service: Orthopedics;  Laterality: Right;  Limited debridement    Social History   Socioeconomic History  . Marital status: Divorced     Spouse name: Not on file  . Number of children: Not on file  . Years of education: Not on file  . Highest education level: Not on file  Social Needs  . Financial resource strain: Not on file  . Food insecurity - worry: Not on file  . Food insecurity - inability: Not on file  . Transportation needs - medical: Not on file  . Transportation needs - non-medical: Not on file  Occupational History  . Not on file  Tobacco Use  . Smoking status: Never Smoker  . Smokeless tobacco: Never Used  Substance and Sexual Activity  . Alcohol use: No    Alcohol/week: 0.0 oz  . Drug use: No  . Sexual activity: Not on file  Other Topics Concern  . Not on file  Social History Narrative  . Not on file    Family History  Problem Relation Age of Onset  . Hypertension Mother   . Lymphoma Mother   . Cancer Sister        Breast  . Breast cancer Sister 23     Current Outpatient Medications:  .  allopurinol (ZYLOPRIM) 300 MG tablet, Take 300 mg by mouth daily. , Disp: , Rfl:  .  azelastine (ASTELIN) 0.1 % nasal spray, Instill 1 spray into each nostril twice daily as needed for congestion,  Disp: , Rfl:  .  Calcium Carb-Cholecalciferol (SM CALCIUM-VITAMIN D) 600-400 MG-UNIT TABS, Take 1 tablet by mouth 2 (two) times daily. , Disp: , Rfl:  .  clindamycin (CLEOCIN T) 1 % SWAB, Apply 1 application topically daily., Disp: , Rfl:  .  Cyanocobalamin (RA VITAMIN B-12 TR) 1000 MCG TBCR, Take 1 tablet by mouth daily. , Disp: , Rfl:  .  cyclobenzaprine (FLEXERIL) 10 MG tablet, Take by mouth., Disp: , Rfl:  .  diphenhydrAMINE (ALLERGY RELIEF) 25 mg capsule, Take 25 mg by mouth 2 (two) times daily., Disp: , Rfl:  .  DULoxetine (CYMBALTA) 30 MG capsule, Take 30-60 mg by mouth 2 (two) times daily. 30 mg in the morning and 60 mg at bedtime, Disp: , Rfl:  .  fluticasone (FLONASE) 50 MCG/ACT nasal spray, Place 2 sprays into both nostrils daily., Disp: , Rfl:  .  gabapentin (NEURONTIN) 300 MG capsule, Take 300 mg by  mouth 3 (three) times daily. 300 mg and a 600 mg to equal 900 mg three times daily, Disp: , Rfl:  .  gabapentin (NEURONTIN) 600 MG tablet, Take 600 mg by mouth 3 (three) times daily. Takes a 300 mg and 600 mg to equal 900 mg three times daily, Disp: , Rfl: 11 .  levothyroxine (SYNTHROID, LEVOTHROID) 50 MCG tablet, Take 50 mcg by mouth daily before breakfast., Disp: , Rfl:  .  MAGNESIUM PO, Take 350 mg by mouth daily., Disp: , Rfl:  .  meloxicam (MOBIC) 15 MG tablet, Take 15 mg by mouth daily., Disp: , Rfl:  .  metoprolol (LOPRESSOR) 100 MG tablet, Take 200 mg by mouth 2 (two) times daily. Takes 2 tablets, Disp: , Rfl:  .  Multiple Vitamins tablet, Take 2 tablets by mouth daily. , Disp: , Rfl:  .  nystatin-triamcinolone (MYCOLOG II) cream, Apply 1 application topically as needed., Disp: , Rfl:  .  omeprazole (PRILOSEC) 20 MG capsule, Take 20 mg by mouth daily. , Disp: , Rfl:  .  potassium chloride (K-DUR,KLOR-CON) 10 MEQ tablet, Take 10 mEq by mouth 3 (three) times daily. , Disp: , Rfl:  .  PRESCRIPTION MEDICATION, APPLY FROM NECK DOWN TWICE DAILY (PHARMACY MIX 1:1 WITH CERAVE CREAM), Disp: , Rfl: 0 .  triamcinolone ointment (KENALOG) 0.1 %, Apply 1 application topically 2 (two) times daily. To body, Disp: , Rfl:  .  warfarin (COUMADIN) 1 MG tablet, Take 1 mg by mouth daily at 6 PM. Takes 5 mg and  1 mg to equal 6 mg daily, Disp: , Rfl:  .  traMADol (ULTRAM) 50 MG tablet, Take 50 mg by mouth every 4 (four) hours as needed for moderate pain (every 4-6 hours as needed). , Disp: , Rfl:   Physical exam:  Vitals:   10/05/17 1018  BP: (!) 154/95  Pulse: 72  Resp: (!) 24  Temp: 98.2 F (36.8 C)  TempSrc: Tympanic  SpO2: 98%  Weight: (!) 320 lb (145.2 kg)   Physical Exam  Constitutional: She is oriented to person, place, and time.  Patient is morbidly obese.  Does not appear to be in any acute distress  HENT:  Head: Normocephalic and atraumatic.  Eyes: EOM are normal. Pupils are equal, round,  and reactive to light.  Neck: Normal range of motion.  Cardiovascular: Normal rate, regular rhythm and normal heart sounds.  Pulmonary/Chest: Effort normal and breath sounds normal.  Abdominal: Soft. Bowel sounds are normal.  Neurological: She is alert and oriented to person, place, and time.  Skin: Skin is warm and dry.  She is status post right mastectomy without reconstruction.  Patient could not lay down on the examination table and therefore breast exam was only performed in sitting position.  No evidence of chest wall recurrence.  No evidence of bilateral axillary and no palpable masses noted in the left breast.  CMP Latest Ref Rng & Units 05/05/2015  Glucose 65 - 99 mg/dL 66  BUN 6 - 20 mg/dL 9  Creatinine 0.44 - 1.00 mg/dL 0.75  Sodium 135 - 145 mmol/L 141  Potassium 3.5 - 5.1 mmol/L 4.4  Chloride 101 - 111 mmol/L 103  CO2 22 - 32 mmol/L 29  Calcium 8.9 - 10.3 mg/dL 8.9  Total Protein 6.4 - 8.2 g/dL -  Total Bilirubin 0.2 - 1.0 mg/dL -  Alkaline Phos 50 - 136 Unit/L -  AST 15 - 37 Unit/L -  ALT 12 - 78 U/L -   CBC Latest Ref Rng & Units 04/04/2017  WBC 3.6 - 11.0 K/uL 9.9  Hemoglobin 12.0 - 16.0 g/dL 12.7  Hematocrit 35.0 - 47.0 % 36.5  Platelets 150 - 440 K/uL 313      Assessment and plan- Patient is a 67 y.o. female who sees me for following medical issues:  1.  LGL: This was found on peripheral flow cytometry that was checked a few years ago.  Patient has a normal Dixon count and her lymphocytosis has remained stable.  I did review her blood work from September 2018.  No other associated cytopenias.  Continue to monitor 2.  Right breast cancer: Clinically patient is doing well and there is no evidence of recurrence on today's exam.  Mammogram from May 2018 did not reveal any evidence of malignancy.  Dr. Caryl Comes will continue to order yearly mammograms.  I will see her back in 1 years time mainly for follow-up of her breast cancer.  At that point she would have completed 5  years of follow-up and can subsequently follow up with Dr. Caryl Comes.   Visit Diagnosis 1. Malignant neoplasm of right breast in female, estrogen receptor negative, unspecified site of breast ([place])   2. Lymphocytosis      Dr. Randa Evens, MD, MPH [place] at [facility] Pager- [phone] 10/05/2017 11:38 AM

## 2017-12-08 DIAGNOSIS — M810 Age-related osteoporosis without current pathological fracture: Secondary | ICD-10-CM | POA: Insufficient documentation

## 2018-02-21 ENCOUNTER — Other Ambulatory Visit: Payer: Self-pay | Admitting: Internal Medicine

## 2018-02-21 DIAGNOSIS — Z1231 Encounter for screening mammogram for malignant neoplasm of breast: Secondary | ICD-10-CM

## 2018-03-14 ENCOUNTER — Ambulatory Visit
Admission: RE | Admit: 2018-03-14 | Discharge: 2018-03-14 | Disposition: A | Discharge status: Home / Self Care | Payer: Medicare HMO | Source: Ambulatory Visit | Attending: Internal Medicine | Admitting: Internal Medicine

## 2018-03-14 DIAGNOSIS — Z1231 Encounter for screening mammogram for malignant neoplasm of breast: Secondary | ICD-10-CM | POA: Diagnosis not present

## 2018-06-07 ENCOUNTER — Other Ambulatory Visit: Payer: Self-pay | Admitting: Internal Medicine

## 2018-06-07 DIAGNOSIS — R1312 Dysphagia, oropharyngeal phase: Secondary | ICD-10-CM

## 2018-06-07 DIAGNOSIS — R3129 Other microscopic hematuria: Secondary | ICD-10-CM | POA: Insufficient documentation

## 2018-06-12 ENCOUNTER — Ambulatory Visit
Admission: RE | Admit: 2018-06-12 | Discharge: 2018-06-12 | Disposition: A | Discharge status: Home / Self Care | Payer: Medicare HMO | Source: Ambulatory Visit | Attending: Internal Medicine | Admitting: Internal Medicine

## 2018-06-12 DIAGNOSIS — R1312 Dysphagia, oropharyngeal phase: Secondary | ICD-10-CM | POA: Insufficient documentation

## 2018-06-25 ENCOUNTER — Ambulatory Visit: Payer: Medicare HMO

## 2018-06-25 ENCOUNTER — Ambulatory Visit
Admission: RE | Admit: 2018-06-25 | Discharge: 2018-06-25 | Disposition: A | Discharge status: Home / Self Care | Payer: Medicare HMO | Source: Ambulatory Visit | Attending: Internal Medicine | Admitting: Internal Medicine

## 2018-06-25 ENCOUNTER — Other Ambulatory Visit: Payer: Self-pay | Admitting: Internal Medicine

## 2018-06-25 DIAGNOSIS — M852 Hyperostosis of skull: Secondary | ICD-10-CM | POA: Diagnosis not present

## 2018-06-25 DIAGNOSIS — R51 Headache: Secondary | ICD-10-CM | POA: Insufficient documentation

## 2018-06-25 DIAGNOSIS — D164 Benign neoplasm of bones of skull and face: Secondary | ICD-10-CM | POA: Diagnosis not present

## 2018-06-25 DIAGNOSIS — R519 Headache, unspecified: Secondary | ICD-10-CM

## 2018-10-05 ENCOUNTER — Inpatient Hospital Stay: Payer: Medicare HMO | Admitting: Oncology

## 2018-10-08 ENCOUNTER — Ambulatory Visit: Admit: 2018-10-08 | Payer: Medicare HMO | Admitting: Ophthalmology

## 2018-10-08 SURGERY — PHACOEMULSIFICATION, CATARACT, WITH IOL INSERTION
Anesthesia: Topical | Laterality: Left

## 2019-01-09 ENCOUNTER — Encounter: Admission: RE | Payer: Self-pay | Source: Home / Self Care

## 2019-01-09 ENCOUNTER — Ambulatory Visit: Admission: RE | Admit: 2019-01-09 | Payer: Medicare HMO | Source: Home / Self Care | Admitting: Ophthalmology

## 2019-01-09 SURGERY — PHACOEMULSIFICATION, CATARACT, WITH IOL INSERTION
Anesthesia: Choice | Laterality: Left

## 2019-03-14 ENCOUNTER — Other Ambulatory Visit: Payer: Self-pay | Admitting: Internal Medicine

## 2019-03-14 DIAGNOSIS — Z1231 Encounter for screening mammogram for malignant neoplasm of breast: Secondary | ICD-10-CM

## 2019-04-24 ENCOUNTER — Other Ambulatory Visit: Payer: Self-pay

## 2019-04-24 ENCOUNTER — Ambulatory Visit
Admission: RE | Admit: 2019-04-24 | Discharge: 2019-04-24 | Disposition: A | Discharge status: Home / Self Care | Payer: Medicare HMO | Source: Ambulatory Visit | Attending: Internal Medicine | Admitting: Internal Medicine

## 2019-04-24 DIAGNOSIS — Z1231 Encounter for screening mammogram for malignant neoplasm of breast: Secondary | ICD-10-CM | POA: Diagnosis not present

## 2019-05-14 ENCOUNTER — Other Ambulatory Visit
Admission: RE | Admit: 2019-05-14 | Discharge: 2019-05-14 | Disposition: A | Discharge status: Home / Self Care | Payer: Medicare HMO | Source: Ambulatory Visit | Attending: Ophthalmology | Admitting: Ophthalmology

## 2019-05-14 ENCOUNTER — Other Ambulatory Visit: Payer: Self-pay

## 2019-05-14 DIAGNOSIS — Z01812 Encounter for preprocedural laboratory examination: Secondary | ICD-10-CM | POA: Diagnosis present

## 2019-05-14 DIAGNOSIS — Z20828 Contact with and (suspected) exposure to other viral communicable diseases: Secondary | ICD-10-CM | POA: Insufficient documentation

## 2019-05-14 LAB — SARS CORONAVIRUS 2 (TAT 6-24 HRS): SARS Coronavirus 2: NEGATIVE

## 2019-05-17 ENCOUNTER — Ambulatory Visit: Payer: Medicare HMO | Admitting: Anesthesiology

## 2019-05-17 ENCOUNTER — Ambulatory Visit
Admission: RE | Admit: 2019-05-17 | Discharge: 2019-05-17 | Disposition: A | Discharge status: Home / Self Care | Payer: Medicare HMO | Attending: Ophthalmology | Admitting: Ophthalmology

## 2019-05-17 ENCOUNTER — Encounter
Admission: RE | Disposition: A | Discharge status: Home / Self Care | Payer: Self-pay | Source: Home / Self Care | Attending: Ophthalmology

## 2019-05-17 ENCOUNTER — Encounter: Payer: Self-pay | Admitting: *Deleted

## 2019-05-17 DIAGNOSIS — Z6841 Body Mass Index (BMI) 40.0 and over, adult: Secondary | ICD-10-CM | POA: Diagnosis not present

## 2019-05-17 DIAGNOSIS — E039 Hypothyroidism, unspecified: Secondary | ICD-10-CM | POA: Insufficient documentation

## 2019-05-17 DIAGNOSIS — I10 Essential (primary) hypertension: Secondary | ICD-10-CM | POA: Diagnosis not present

## 2019-05-17 DIAGNOSIS — G4730 Sleep apnea, unspecified: Secondary | ICD-10-CM | POA: Diagnosis not present

## 2019-05-17 DIAGNOSIS — E538 Deficiency of other specified B group vitamins: Secondary | ICD-10-CM | POA: Diagnosis not present

## 2019-05-17 DIAGNOSIS — Z79890 Hormone replacement therapy (postmenopausal): Secondary | ICD-10-CM | POA: Insufficient documentation

## 2019-05-17 DIAGNOSIS — Z9221 Personal history of antineoplastic chemotherapy: Secondary | ICD-10-CM | POA: Insufficient documentation

## 2019-05-17 DIAGNOSIS — F329 Major depressive disorder, single episode, unspecified: Secondary | ICD-10-CM | POA: Diagnosis not present

## 2019-05-17 DIAGNOSIS — Z7951 Long term (current) use of inhaled steroids: Secondary | ICD-10-CM | POA: Diagnosis not present

## 2019-05-17 DIAGNOSIS — K219 Gastro-esophageal reflux disease without esophagitis: Secondary | ICD-10-CM | POA: Insufficient documentation

## 2019-05-17 DIAGNOSIS — Z853 Personal history of malignant neoplasm of breast: Secondary | ICD-10-CM | POA: Insufficient documentation

## 2019-05-17 DIAGNOSIS — M1990 Unspecified osteoarthritis, unspecified site: Secondary | ICD-10-CM | POA: Insufficient documentation

## 2019-05-17 DIAGNOSIS — Z79899 Other long term (current) drug therapy: Secondary | ICD-10-CM | POA: Diagnosis not present

## 2019-05-17 DIAGNOSIS — M109 Gout, unspecified: Secondary | ICD-10-CM | POA: Diagnosis not present

## 2019-05-17 DIAGNOSIS — H2512 Age-related nuclear cataract, left eye: Secondary | ICD-10-CM | POA: Diagnosis not present

## 2019-05-17 HISTORY — PX: CATARACT EXTRACTION W/PHACO: SHX586

## 2019-05-17 HISTORY — DX: Diverticulitis of intestine, part unspecified, without perforation or abscess without bleeding: K57.92

## 2019-05-17 SURGERY — PHACOEMULSIFICATION, CATARACT, WITH IOL INSERTION
Anesthesia: Monitor Anesthesia Care | Site: Eye | Laterality: Left

## 2019-05-17 MED ORDER — LIDOCAINE HCL (PF) 4 % IJ SOLN
INTRAOCULAR | Status: DC | PRN
  Administered 2019-05-17: 08:00:00 4 mL via OPHTHALMIC

## 2019-05-17 MED ORDER — POVIDONE-IODINE 5 % OP SOLN
OPHTHALMIC | Status: DC | PRN
  Administered 2019-05-17: 1 via OPHTHALMIC

## 2019-05-17 MED ORDER — FENTANYL CITRATE (PF) 100 MCG/2ML IJ SOLN
INTRAMUSCULAR | Status: DC | PRN
  Administered 2019-05-17: 25 ug via INTRAVENOUS

## 2019-05-17 MED ORDER — EPINEPHRINE PF 1 MG/ML IJ SOLN
INTRAOCULAR | Status: DC | PRN
  Administered 2019-05-17: 1 mL via OPHTHALMIC

## 2019-05-17 MED ORDER — SODIUM HYALURONATE 23 MG/ML IO SOLN
INTRAOCULAR | Status: AC
  Filled 2019-05-17: qty 0.6

## 2019-05-17 MED ORDER — TETRACAINE HCL 0.5 % OP SOLN
OPHTHALMIC | Status: AC
  Administered 2019-05-17: 07:00:00 1 [drp] via OPHTHALMIC
  Filled 2019-05-17: qty 4

## 2019-05-17 MED ORDER — MOXIFLOXACIN HCL 0.5 % OP SOLN
OPHTHALMIC | Status: AC
  Filled 2019-05-17: qty 3

## 2019-05-17 MED ORDER — LIDOCAINE HCL (PF) 4 % IJ SOLN
INTRAMUSCULAR | Status: AC
  Filled 2019-05-17: qty 5

## 2019-05-17 MED ORDER — ARMC OPHTHALMIC DILATING DROPS
OPHTHALMIC | Status: AC
  Administered 2019-05-17: 1 via OPHTHALMIC
  Filled 2019-05-17: qty 0.5

## 2019-05-17 MED ORDER — MIDAZOLAM HCL 2 MG/2ML IJ SOLN
INTRAMUSCULAR | Status: DC | PRN
  Administered 2019-05-17: 1 mg via INTRAVENOUS

## 2019-05-17 MED ORDER — FENTANYL CITRATE (PF) 100 MCG/2ML IJ SOLN
INTRAMUSCULAR | Status: AC
  Filled 2019-05-17: qty 2

## 2019-05-17 MED ORDER — ARMC OPHTHALMIC DILATING DROPS
1.0000 "application " | OPHTHALMIC | Status: AC
  Administered 2019-05-17 (×3): 1 via OPHTHALMIC

## 2019-05-17 MED ORDER — MIDAZOLAM HCL 2 MG/2ML IJ SOLN
INTRAMUSCULAR | Status: AC
  Filled 2019-05-17: qty 2

## 2019-05-17 MED ORDER — MOXIFLOXACIN HCL 0.5 % OP SOLN
1.0000 [drp] | Freq: Once | OPHTHALMIC | Status: AC
  Administered 2019-05-17: 08:00:00 1 [drp] via OPHTHALMIC
  Filled 2019-05-17: qty 3

## 2019-05-17 MED ORDER — SODIUM CHLORIDE 0.9 % IV SOLN
Freq: Once | INTRAVENOUS | Status: AC
  Administered 2019-05-17: 08:00:00 via INTRAVENOUS

## 2019-05-17 MED ORDER — CARBACHOL 0.01 % IO SOLN
INTRAOCULAR | Status: DC | PRN
  Administered 2019-05-17: 0.5 mL via INTRAOCULAR

## 2019-05-17 MED ORDER — EPINEPHRINE PF 1 MG/ML IJ SOLN
INTRAMUSCULAR | Status: AC
  Filled 2019-05-17: qty 2

## 2019-05-17 MED ORDER — SODIUM CHLORIDE 0.9 % IV SOLN
INTRAVENOUS | Status: DC | PRN
  Administered 2019-05-17: 08:00:00 via INTRAVENOUS

## 2019-05-17 MED ORDER — TETRACAINE HCL 0.5 % OP SOLN
1.0000 [drp] | Freq: Once | OPHTHALMIC | Status: AC
  Administered 2019-05-17 (×2): 1 [drp] via OPHTHALMIC

## 2019-05-17 MED ORDER — POVIDONE-IODINE 5 % OP SOLN
OPHTHALMIC | Status: AC
  Filled 2019-05-17: qty 30

## 2019-05-17 MED ORDER — SODIUM HYALURONATE 10 MG/ML IO SOLN
INTRAOCULAR | Status: DC | PRN
  Administered 2019-05-17: 0.55 mL via INTRAOCULAR

## 2019-05-17 MED ORDER — SODIUM HYALURONATE 23 MG/ML IO SOLN
INTRAOCULAR | Status: DC | PRN
  Administered 2019-05-17: 0.6 mL via INTRAOCULAR

## 2019-05-17 SURGICAL SUPPLY — 16 items

## 2019-05-17 NOTE — Op Note (Signed)
OPERATIVE NOTE  Suzanne Dixon [id number] 05/17/2019   PREOPERATIVE DIAGNOSIS:  Nuclear sclerotic cataract left eye.  H25.12   POSTOPERATIVE DIAGNOSIS:    Nuclear sclerotic cataract left eye.     PROCEDURE:  Phacoemusification with posterior chamber intraocular lens placement of the left eye   LENS:   Implant Name Type Inv. Item Serial No. Manufacturer Lot No. LRB No. Used Action  LENS IOL DIOP 16.5 - [id number] Intraocular Lens LENS IOL DIOP 16.5 [id number]  Left 1 Implanted       PCB +16.5   ULTRASOUND TIME: 0 minutes 28 seconds.  CDE 2.15   SURGEON:  Benay Pillow, MD, MPH   ANESTHESIA:  Topical with tetracaine drops augmented with 1% preservative-free intracameral lidocaine.  ESTIMATED BLOOD LOSS: <1 mL   COMPLICATIONS:  None.   DESCRIPTION OF PROCEDURE:  The patient was identified in the holding room and transported to the operating room and placed in the supine position under the operating microscope.  The left eye was identified as the operative eye and it was prepped and draped in the usual sterile ophthalmic fashion.   A 1.0 millimeter clear-corneal paracentesis was made at the 5:00 position. 0.5 ml of preservative-free 1% lidocaine with epinephrine was injected into the anterior chamber.  The anterior chamber was filled with Healon 5 viscoelastic.  A 2.4 millimeter keratome was used to make a near-clear corneal incision at the 2:00 position.  A curvilinear capsulorrhexis was made with a cystotome and capsulorrhexis forceps.  Balanced salt solution was used to hydrodissect and hydrodelineate the nucleus.   Phacoemulsification was then used in stop and chop fashion to remove the lens nucleus and epinucleus.  The remaining cortex was then removed using the irrigation and aspiration handpiece. Healon was then placed into the capsular bag to distend it for lens placement.  A lens was then injected into the capsular bag.  The remaining viscoelastic was aspirated.   Wounds  were hydrated with balanced salt solution.  The anterior chamber was inflated to a physiologic pressure with balanced salt solution.  Intracameral vigamox 0.1 mL undiltued was injected into the eye and a drop placed onto the ocular surface.  No wound leaks were noted.  The patient was taken to the recovery room in stable condition without complications of anesthesia or surgery  Benay Pillow 05/17/2019, 8:51 AM

## 2019-05-17 NOTE — Discharge Instructions (Addendum)
Eye Surgery Discharge Instructions  Expect mild scratchy sensation or mild soreness. DO NOT RUB YOUR EYE!  The day of surgery:  Minimal physical activity, but bed rest is not required  No reading, computer work, or close hand work  No bending, lifting, or straining.  May watch TV  For 24 hours:  No driving, legal decisions, or alcoholic beverages  Safety precautions  Eat anything you prefer: It is better to start with liquids, then soup then solid foods.  Solar shield eyeglasses should be worn for comfort in the sunlight/patch while sleeping  Resume all regular medications including aspirin or Coumadin if these were discontinued prior to surgery. You may shower, bathe, shave, or wash your hair. Tylenol may be taken for mild discomfort. FOLLOW EYE DROP INSTRUCTION SHEET AS REVIEWED.  Call your doctor if you experience significant pain, nausea, or vomiting, fever > 101 or other signs of infection. [phone] or [phone] Specific instructions:  Follow-up Information    Eulogio Bear, MD Follow up.   Specialty: Ophthalmology Why: 05-17-19 @ 3:30 pm  Contact information: [address] [place] [address] [phone]

## 2019-05-17 NOTE — Anesthesia Preprocedure Evaluation (Addendum)
Anesthesia Evaluation  Patient identified by MRN, date of birth, ID band Patient awake    Reviewed: Allergy & Precautions, H&P , NPO status , Patient's Chart, lab work & pertinent test results  History of Anesthesia Complications Negative for: history of anesthetic complications  Airway Mallampati: III  TM Distance: >3 FB     Dental  (+) Chipped   Pulmonary sleep apnea and Continuous Positive Airway Pressure Ventilation ,           Cardiovascular hypertension,      Neuro/Psych PSYCHIATRIC DISORDERS Depression negative neurological ROS     GI/Hepatic Neg liver ROS, GERD  Controlled,  Endo/Other  Hypothyroidism Morbid obesity (BMI 24)  Renal/GU      Musculoskeletal  (+) Arthritis ,   Abdominal   Peds  Hematology negative hematology ROS (+)   Anesthesia Other Findings Past Medical History: No date: B12 deficiency 03/31/2013: Breast cancer ([place])     Comment:  Right - chemo- mastectomy 1991: Breast cancer ([place])     Comment:  Rt.- radiation No date: DDD (degenerative disc disease), cervical No date: Depression No date: Diverticulitis No date: Edema of both legs No date: GERD (gastroesophageal reflux disease) No date: Gout 2014: H/O mastectomy     Comment:  per patient 89: H/O: cesarean section     Comment:  per patient report 31: H/O: hysterectomy     Comment:  per patient No date: Hydradenitis No date: Hypertension No date: Hypothyroidism No date: Neuropathy 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: Ruptured cervical disc     Comment:  per patient  No date: Sleep apnea No date: Tachycardia  Past Surgical History: No date: ABDOMINAL HYSTERECTOMY 2002: BACK SURGERY     Comment:  plate and 2 screws in neck 9480,1655: BREAST BIOPSY; Right     Comment:  Positive No date: BREAST CYST ASPIRATION; Left 2014: BREAST SURGERY; Right     Comment:  mastectomy No date: CESAREAN SECTION No date:  CHOLECYSTECTOMY 06/22/2015: COLONOSCOPY WITH PROPOFOL; N/A     Comment:  Procedure: COLONOSCOPY WITH PROPOFOL;  Surgeon: Manya Silvas, MD;  Location: [facility] ENDOSCOPY;  Service:               Endoscopy;  Laterality: N/A; No date: ESOPHAGOGASTRODUODENOSCOPY No date: LAPAROSCOPIC GASTRIC BYPASS No date: MASTECTOMY; Right No date: NEUROPLASTY / TRANSPOSITION ULNAR NERVE AT ELBOW 05/13/2015: PORT-A-CATH REMOVAL; Left     Comment:  Procedure: REMOVAL PORT-A-CATH LEFT CHEST ;  Surgeon:               Florene Glen, MD;  Location: [facility] ORS;  Service:               General;  Laterality: Left; No date: REDUCTION MAMMAPLASTY 01/03/2017: SHOULDER ARTHROSCOPY WITH OPEN ROTATOR CUFF REPAIR; Right     Comment:  Procedure: SHOULDER ARTHROSCOPY WITH OPEN ROTATOR CUFF               REPAIR;  Surgeon: Corky Mull, MD;  Location: [facility] ORS;               Service: Orthopedics;  Laterality: Right; 01/03/2017: SHOULDER ARTHROSCOPY WITH SUBACROMIAL DECOMPRESSION,  ROTATOR CUFF REPAIR AND BICEP TENDON REPAIR; Right     Comment:  Procedure: SHOULDER ARTHROSCOPY WITH SUBACROMIAL               DECOMPRESSION, ROTATOR CUFF REPAIR AND BICEP TENDON  REPAIR;  Surgeon: Corky Mull, MD;  Location: [facility] ORS;               Service: Orthopedics;  Laterality: Right;  Limited               debridement  BMI    Body Mass Index: 50.71 kg/m      Reproductive/Obstetrics negative OB ROS                            Anesthesia Physical Anesthesia Plan  ASA: III  Anesthesia Plan: MAC   Post-op Pain Management:    Induction:   PONV Risk Score and Plan:   Airway Management Planned: Natural Airway and Nasal Cannula  Additional Equipment:   Intra-op Plan:   Post-operative Plan:   Informed Consent: I have reviewed the patients History and Physical, chart, labs and discussed the procedure including the risks, benefits and alternatives for the proposed anesthesia with  the patient or authorized representative who has indicated his/her understanding and acceptance.       Plan Discussed with: Anesthesiologist and CRNA  Anesthesia Plan Comments:       Anesthesia Quick Evaluation

## 2019-05-17 NOTE — Anesthesia Procedure Notes (Signed)
Date/Time: 05/17/2019 8:20 AM Performed by: Allean Found, CRNA Pre-anesthesia Checklist: Patient identified, Emergency Drugs available, Suction available, Patient being monitored and Timeout performed Oxygen Delivery Method: Nasal cannula Placement Confirmation: positive ETCO2

## 2019-05-17 NOTE — Transfer of Care (Signed)
Immediate Anesthesia Transfer of Care Note  Patient: Suzanne Dixon  Procedure(s) Performed: CATARACT EXTRACTION PHACO AND INTRAOCULAR LENS PLACEMENT (IOC) (Left Eye)  Patient Location: PACU  Anesthesia Type:MAC  Level of Consciousness: awake and alert   Airway & Oxygen Therapy: Patient Spontanous Breathing and Patient connected to nasal cannula oxygen  Post-op Assessment: Report given to RN and Post -op Vital signs reviewed and stable  Post vital signs: Reviewed and stable  Last Vitals:  Vitals Value Taken Time  BP    Temp 36.1 C 05/17/19 0853  Pulse    Resp    SpO2      Last Pain:  Vitals:   05/17/19 0853  TempSrc: Temporal  PainSc:          Complications: No apparent anesthesia complications

## 2019-05-17 NOTE — Anesthesia Postprocedure Evaluation (Signed)
Anesthesia Post Note  Patient: Suzanne Dixon  Procedure(s) Performed: CATARACT EXTRACTION PHACO AND INTRAOCULAR LENS PLACEMENT (IOC) (Left Eye)  Patient location during evaluation: Phase II Anesthesia Type: MAC Level of consciousness: awake and alert and patient cooperative Pain management: pain level controlled Vital Signs Assessment: post-procedure vital signs reviewed and stable Respiratory status: spontaneous breathing Cardiovascular status: blood pressure returned to baseline Anesthetic complications: no     Last Vitals:  Vitals:   05/17/19 0642 05/17/19 0853  BP: (!) 148/97   Pulse: 69 (!) 58  Resp: 16 16  Temp: (!) 36.2 C (!) 36.1 C  SpO2: 97% 98%    Last Pain:  Vitals:   05/17/19 0853  TempSrc: Temporal  PainSc:                  Buckner Malta

## 2019-05-17 NOTE — Anesthesia Post-op Follow-up Note (Signed)
Anesthesia QCDR form completed.        

## 2019-05-17 NOTE — H&P (Signed)

## 2019-05-31 ENCOUNTER — Encounter: Payer: Self-pay | Admitting: *Deleted

## 2019-06-11 ENCOUNTER — Other Ambulatory Visit
Admission: RE | Admit: 2019-06-11 | Discharge: 2019-06-11 | Disposition: A | Discharge status: Home / Self Care | Payer: Medicare HMO | Source: Ambulatory Visit | Attending: Ophthalmology | Admitting: Ophthalmology

## 2019-06-11 ENCOUNTER — Other Ambulatory Visit: Payer: Self-pay

## 2019-06-11 DIAGNOSIS — Z01812 Encounter for preprocedural laboratory examination: Secondary | ICD-10-CM | POA: Insufficient documentation

## 2019-06-11 DIAGNOSIS — H2511 Age-related nuclear cataract, right eye: Secondary | ICD-10-CM | POA: Insufficient documentation

## 2019-06-11 DIAGNOSIS — Z20828 Contact with and (suspected) exposure to other viral communicable diseases: Secondary | ICD-10-CM | POA: Diagnosis not present

## 2019-06-11 LAB — SARS CORONAVIRUS 2 (TAT 6-24 HRS): SARS Coronavirus 2: NEGATIVE

## 2019-06-14 ENCOUNTER — Encounter
Admission: RE | Disposition: A | Discharge status: Home / Self Care | Payer: Self-pay | Source: Home / Self Care | Attending: Ophthalmology

## 2019-06-14 ENCOUNTER — Ambulatory Visit: Payer: Medicare HMO | Admitting: Certified Registered Nurse Anesthetist

## 2019-06-14 ENCOUNTER — Other Ambulatory Visit: Payer: Self-pay

## 2019-06-14 ENCOUNTER — Ambulatory Visit
Admission: RE | Admit: 2019-06-14 | Discharge: 2019-06-14 | Disposition: A | Discharge status: Home / Self Care | Payer: Medicare HMO | Attending: Ophthalmology | Admitting: Ophthalmology

## 2019-06-14 DIAGNOSIS — G4730 Sleep apnea, unspecified: Secondary | ICD-10-CM | POA: Diagnosis not present

## 2019-06-14 DIAGNOSIS — I10 Essential (primary) hypertension: Secondary | ICD-10-CM | POA: Diagnosis not present

## 2019-06-14 DIAGNOSIS — Z923 Personal history of irradiation: Secondary | ICD-10-CM | POA: Diagnosis not present

## 2019-06-14 DIAGNOSIS — H2511 Age-related nuclear cataract, right eye: Secondary | ICD-10-CM | POA: Diagnosis present

## 2019-06-14 DIAGNOSIS — Z9221 Personal history of antineoplastic chemotherapy: Secondary | ICD-10-CM | POA: Insufficient documentation

## 2019-06-14 DIAGNOSIS — K219 Gastro-esophageal reflux disease without esophagitis: Secondary | ICD-10-CM | POA: Diagnosis not present

## 2019-06-14 DIAGNOSIS — Z791 Long term (current) use of non-steroidal anti-inflammatories (NSAID): Secondary | ICD-10-CM | POA: Insufficient documentation

## 2019-06-14 DIAGNOSIS — Z6841 Body Mass Index (BMI) 40.0 and over, adult: Secondary | ICD-10-CM | POA: Insufficient documentation

## 2019-06-14 DIAGNOSIS — Z9011 Acquired absence of right breast and nipple: Secondary | ICD-10-CM | POA: Insufficient documentation

## 2019-06-14 DIAGNOSIS — Z79899 Other long term (current) drug therapy: Secondary | ICD-10-CM | POA: Diagnosis not present

## 2019-06-14 DIAGNOSIS — G629 Polyneuropathy, unspecified: Secondary | ICD-10-CM | POA: Insufficient documentation

## 2019-06-14 DIAGNOSIS — Z7901 Long term (current) use of anticoagulants: Secondary | ICD-10-CM | POA: Diagnosis not present

## 2019-06-14 DIAGNOSIS — M109 Gout, unspecified: Secondary | ICD-10-CM | POA: Diagnosis not present

## 2019-06-14 DIAGNOSIS — Z853 Personal history of malignant neoplasm of breast: Secondary | ICD-10-CM | POA: Diagnosis not present

## 2019-06-14 DIAGNOSIS — I4891 Unspecified atrial fibrillation: Secondary | ICD-10-CM | POA: Insufficient documentation

## 2019-06-14 HISTORY — PX: CATARACT EXTRACTION W/PHACO: SHX586

## 2019-06-14 HISTORY — DX: Unspecified atrial fibrillation: I48.91

## 2019-06-14 SURGERY — PHACOEMULSIFICATION, CATARACT, WITH IOL INSERTION
Anesthesia: Monitor Anesthesia Care | Site: Eye | Laterality: Right

## 2019-06-14 MED ORDER — MIDAZOLAM HCL 2 MG/2ML IJ SOLN
INTRAMUSCULAR | Status: DC | PRN
  Administered 2019-06-14 (×2): 0.5 mg via INTRAVENOUS

## 2019-06-14 MED ORDER — MOXIFLOXACIN HCL 0.5 % OP SOLN
OPHTHALMIC | Status: AC
  Filled 2019-06-14: qty 3

## 2019-06-14 MED ORDER — TETRACAINE HCL 0.5 % OP SOLN
OPHTHALMIC | Status: AC
  Administered 2019-06-14: 09:00:00
  Filled 2019-06-14: qty 4

## 2019-06-14 MED ORDER — ARMC OPHTHALMIC DILATING DROPS
OPHTHALMIC | Status: AC
  Administered 2019-06-14: 09:00:00
  Filled 2019-06-14: qty 0.5

## 2019-06-14 MED ORDER — TETRACAINE HCL 0.5 % OP SOLN: 1.0000 [drp] | OPHTHALMIC | Status: DC | PRN

## 2019-06-14 MED ORDER — SODIUM HYALURONATE 23 MG/ML IO SOLN
INTRAOCULAR | Status: DC | PRN
  Administered 2019-06-14: 0.6 mL via INTRAOCULAR

## 2019-06-14 MED ORDER — MOXIFLOXACIN HCL 0.5 % OP SOLN
OPHTHALMIC | Status: DC | PRN
  Administered 2019-06-14: 0.2 mL via OPHTHALMIC

## 2019-06-14 MED ORDER — MOXIFLOXACIN HCL 0.5 % OP SOLN - NO CHARGE
1.0000 [drp] | Freq: Once | OPHTHALMIC | Status: DC
  Filled 2019-06-14: qty 3

## 2019-06-14 MED ORDER — SODIUM HYALURONATE 10 MG/ML IO SOLN
INTRAOCULAR | Status: DC | PRN
  Administered 2019-06-14: 0.55 mL via INTRAOCULAR

## 2019-06-14 MED ORDER — ONDANSETRON HCL 4 MG/2ML IJ SOLN
INTRAMUSCULAR | Status: DC | PRN
  Administered 2019-06-14: 4 mg via INTRAVENOUS

## 2019-06-14 MED ORDER — SODIUM CHLORIDE 0.9 % IV SOLN
INTRAVENOUS | Status: DC
  Administered 2019-06-14: 11:00:00 via INTRAVENOUS

## 2019-06-14 MED ORDER — EPINEPHRINE PF 1 MG/ML IJ SOLN
INTRAOCULAR | Status: DC | PRN
  Administered 2019-06-14: 1 mL via OPHTHALMIC

## 2019-06-14 MED ORDER — FENTANYL CITRATE (PF) 100 MCG/2ML IJ SOLN
INTRAMUSCULAR | Status: AC
  Filled 2019-06-14: qty 2

## 2019-06-14 MED ORDER — MIDAZOLAM HCL 2 MG/2ML IJ SOLN
INTRAMUSCULAR | Status: AC
  Filled 2019-06-14: qty 2

## 2019-06-14 MED ORDER — LIDOCAINE HCL (PF) 4 % IJ SOLN
INTRAOCULAR | Status: DC | PRN
  Administered 2019-06-14: 4 mL via OPHTHALMIC

## 2019-06-14 MED ORDER — ARMC OPHTHALMIC DILATING DROPS: 1.0000 "application " | OPHTHALMIC | Status: AC

## 2019-06-14 MED ORDER — POVIDONE-IODINE 5 % OP SOLN
OPHTHALMIC | Status: DC | PRN
  Administered 2019-06-14: 1 via OPHTHALMIC

## 2019-06-14 MED ORDER — CARBACHOL 0.01 % IO SOLN
INTRAOCULAR | Status: DC | PRN
  Administered 2019-06-14: 0.5 mL via INTRAOCULAR

## 2019-06-14 MED ORDER — FENTANYL CITRATE (PF) 100 MCG/2ML IJ SOLN
INTRAMUSCULAR | Status: DC | PRN
  Administered 2019-06-14: 25 ug via INTRAVENOUS

## 2019-06-14 SURGICAL SUPPLY — 16 items

## 2019-06-14 NOTE — Op Note (Signed)
OPERATIVE NOTE  Suzanne Dixon [id number] 06/14/2019   PREOPERATIVE DIAGNOSIS:  Nuclear sclerotic cataract right eye.  H25.11   POSTOPERATIVE DIAGNOSIS:    Nuclear sclerotic cataract right eye.     PROCEDURE:  Phacoemusification with posterior chamber intraocular lens placement of the right eye   LENS:   Implant Name Type Inv. Item Serial No. Manufacturer Lot No. LRB No. Used Action  LENS IOL DIOP 16.0 - [id number] Intraocular Lens LENS IOL DIOP 16.0 [id number] AMO  Right 1 Implanted       Procedure(s) with comments: CATARACT EXTRACTION PHACO AND INTRAOCULAR LENS PLACEMENT (IOC) RIGHT (Right) - [place] 00:35.0 CDE 2.37 Fluid Pack Lot [id number]    SURGEON:  Benay Pillow, MD, MPH  ANESTHESIOLOGIST: Anesthesiologist: Durenda Hurt, MD CRNA: Willette Alma, CRNA   ANESTHESIA:  Topical with tetracaine drops augmented with 1% preservative-free intracameral lidocaine.  ESTIMATED BLOOD LOSS: less than 1 mL.   COMPLICATIONS:  None.   DESCRIPTION OF PROCEDURE:  The patient was identified in the holding room and transported to the operating room and placed in the supine position under the operating microscope.  The right eye was identified as the operative eye and it was prepped and draped in the usual sterile ophthalmic fashion.   A 1.0 millimeter clear-corneal paracentesis was made at the 10:30 position. 0.5 ml of preservative-free 1% lidocaine with epinephrine was injected into the anterior chamber.  The anterior chamber was filled with Healon 5 viscoelastic.  A 2.4 millimeter keratome was used to make a near-clear corneal incision at the 8:00 position.  A curvilinear capsulorrhexis was made with a cystotome and capsulorrhexis forceps.  Balanced salt solution was used to hydrodissect and hydrodelineate the nucleus.   Phacoemulsification was then used in stop and chop fashion to remove the lens nucleus and epinucleus.  The remaining cortex was then removed using the irrigation  and aspiration handpiece. Healon was then placed into the capsular bag to distend it for lens placement.  A lens was then injected into the capsular bag.  The remaining viscoelastic was aspirated.   Wounds were hydrated with balanced salt solution.  The anterior chamber was inflated to a physiologic pressure with balanced salt solution.   Intracameral vigamox 0.1 mL undiluted was injected into the eye and a drop placed onto the ocular surface.  No wound leaks were noted.  The patient was taken to the recovery room in stable condition without complications of anesthesia or surgery  Benay Pillow 06/14/2019, 10:58 AM

## 2019-06-14 NOTE — Discharge Instructions (Addendum)
Eye Surgery Discharge Instructions  Expect mild scratchy sensation or mild soreness. DO NOT RUB YOUR EYE!  The day of surgery:  Minimal physical activity, but bed rest is not required  No reading, computer work, or close hand work  No bending, lifting, or straining.  May watch TV  For 24 hours:  No driving, legal decisions, or alcoholic beverages  Safety precautions  Eat anything you prefer: It is better to start with liquids, then soup then solid foods.  Solar shield eyeglasses should be worn for comfort in the sunlight/patch while sleeping  Resume all regular medications including aspirin or Coumadin if these were discontinued prior to surgery. You may shower, bathe, shave, or wash your hair. Tylenol may be taken for mild discomfort. Follow eye drop instruction sheet as reviewed.  Call your doctor if you experience significant pain, nausea, or vomiting, fever > 101 or other signs of infection. [phone] or [phone] Specific instructions:  Follow-up Information    Eulogio Bear, MD Follow up.   Specialty: Ophthalmology Why: 06-14-19 @ 3:45 pm  Contact information: [address] [place] [address] [phone]

## 2019-06-14 NOTE — H&P (Signed)

## 2019-06-14 NOTE — Anesthesia Post-op Follow-up Note (Signed)
Anesthesia QCDR form completed.        

## 2019-06-14 NOTE — Anesthesia Preprocedure Evaluation (Addendum)
Anesthesia Evaluation  Patient identified by MRN, date of birth, ID band Patient awake    Reviewed: Allergy & Precautions, H&P , NPO status , Patient's Chart, lab work & pertinent test results  History of Anesthesia Complications Negative for: history of anesthetic complications  Airway Mallampati: III  TM Distance: >3 FB     Dental  (+) Chipped   Pulmonary sleep apnea and Continuous Positive Airway Pressure Ventilation , neg COPD, neg recent URI,           Cardiovascular hypertension, (-) angina     Neuro/Psych PSYCHIATRIC DISORDERS Depression negative neurological ROS     GI/Hepatic Neg liver ROS, GERD  Controlled,  Endo/Other  Hypothyroidism Morbid obesity (BMI 51)  Renal/GU      Musculoskeletal  (+) Arthritis ,   Abdominal   Peds  Hematology negative hematology ROS (+)   Anesthesia Other Findings Past Medical History: No date: B12 deficiency 03/31/2013: Breast cancer ([place])     Comment:  Right - chemo- mastectomy 1991: Breast cancer ([place])     Comment:  Rt.- radiation No date: DDD (degenerative disc disease), cervical No date: Depression No date: Diverticulitis No date: Edema of both legs No date: GERD (gastroesophageal reflux disease) No date: Gout 2014: H/O mastectomy     Comment:  per patient 43: H/O: cesarean section     Comment:  per patient report 25: H/O: hysterectomy     Comment:  per patient No date: Hydradenitis No date: Hypertension No date: Hypothyroidism No date: Neuropathy 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: Ruptured cervical disc     Comment:  per patient  No date: Sleep apnea No date: Tachycardia  Past Surgical History: No date: ABDOMINAL HYSTERECTOMY 2002: BACK SURGERY     Comment:  plate and 2 screws in neck 1610,9604: BREAST BIOPSY; Right     Comment:  Positive No date: BREAST CYST ASPIRATION; Left 2014: BREAST SURGERY; Right     Comment:  mastectomy No  date: CESAREAN SECTION No date: CHOLECYSTECTOMY 06/22/2015: COLONOSCOPY WITH PROPOFOL; N/A     Comment:  Procedure: COLONOSCOPY WITH PROPOFOL;  Surgeon: Manya Silvas, MD;  Location: [facility] ENDOSCOPY;  Service:               Endoscopy;  Laterality: N/A; No date: ESOPHAGOGASTRODUODENOSCOPY No date: LAPAROSCOPIC GASTRIC BYPASS No date: MASTECTOMY; Right No date: NEUROPLASTY / TRANSPOSITION ULNAR NERVE AT ELBOW 05/13/2015: PORT-A-CATH REMOVAL; Left     Comment:  Procedure: REMOVAL PORT-A-CATH LEFT CHEST ;  Surgeon:               Florene Glen, MD;  Location: [facility] ORS;  Service:               General;  Laterality: Left; No date: REDUCTION MAMMAPLASTY 01/03/2017: SHOULDER ARTHROSCOPY WITH OPEN ROTATOR CUFF REPAIR; Right     Comment:  Procedure: SHOULDER ARTHROSCOPY WITH OPEN ROTATOR CUFF               REPAIR;  Surgeon: Corky Mull, MD;  Location: [facility] ORS;               Service: Orthopedics;  Laterality: Right; 01/03/2017: SHOULDER ARTHROSCOPY WITH SUBACROMIAL DECOMPRESSION,  ROTATOR CUFF REPAIR AND BICEP TENDON REPAIR; Right     Comment:  Procedure: SHOULDER ARTHROSCOPY WITH SUBACROMIAL               DECOMPRESSION, ROTATOR CUFF REPAIR AND  BICEP TENDON               REPAIR;  Surgeon: Corky Mull, MD;  Location: [facility] ORS;               Service: Orthopedics;  Laterality: Right;  Limited               debridement  BMI    Body Mass Index: 50.71 kg/m      Reproductive/Obstetrics negative OB ROS                            Anesthesia Physical  Anesthesia Plan  ASA: III  Anesthesia Plan: MAC   Post-op Pain Management:    Induction:   PONV Risk Score and Plan:   Airway Management Planned: Natural Airway and Nasal Cannula  Additional Equipment:   Intra-op Plan:   Post-operative Plan:   Informed Consent: I have reviewed the patients History and Physical, chart, labs and discussed the procedure including the risks, benefits and  alternatives for the proposed anesthesia with the patient or authorized representative who has indicated his/her understanding and acceptance.       Plan Discussed with: Anesthesiologist and CRNA  Anesthesia Plan Comments:         Anesthesia Quick Evaluation

## 2019-06-14 NOTE — Anesthesia Postprocedure Evaluation (Signed)
Anesthesia Post Note  Patient: Suzanne Dixon  Procedure(s) Performed: CATARACT EXTRACTION PHACO AND INTRAOCULAR LENS PLACEMENT (IOC) RIGHT (Right Eye)  Patient location during evaluation: PACU Anesthesia Type: MAC Level of consciousness: awake and alert Pain management: pain level controlled Vital Signs Assessment: post-procedure vital signs reviewed and stable Respiratory status: spontaneous breathing, nonlabored ventilation, respiratory function stable and patient connected to nasal cannula oxygen Cardiovascular status: stable and blood pressure returned to baseline Postop Assessment: no apparent nausea or vomiting Anesthetic complications: no     Last Vitals:  Vitals:   06/14/19 0830 06/14/19 1058  BP: (!) 154/98 (!) 173/93  Pulse: 70 (!) 57  Resp: 18 18  Temp: (!) 36.2 C 36.5 C  SpO2: 97% 100%    Last Pain:  Vitals:   06/14/19 1058  TempSrc: Temporal  PainSc: 0-No pain                 Durenda Hurt

## 2019-06-14 NOTE — Transfer of Care (Signed)
Immediate Anesthesia Transfer of Care Note  Patient: Suzanne Dixon  Procedure(s) Performed: CATARACT EXTRACTION PHACO AND INTRAOCULAR LENS PLACEMENT (IOC) RIGHT (Right Eye)  Patient Location: PACU  Anesthesia Type:MAC  Level of Consciousness: awake, alert  and oriented  Airway & Oxygen Therapy: Patient Spontanous Breathing  Post-op Assessment: Report given to RN and Post -op Vital signs reviewed and stable  Post vital signs: Reviewed and stable  Last Vitals:  Vitals Value Taken Time  BP    Temp    Pulse    Resp    SpO2      Last Pain:  Vitals:   06/14/19 0830  TempSrc: Oral  PainSc: 0-No pain         Complications: No apparent anesthesia complications

## 2019-06-17 ENCOUNTER — Encounter: Payer: Self-pay | Admitting: Ophthalmology

## 2019-12-20 ENCOUNTER — Emergency Department
Admission: EM | Admit: 2019-12-20 | Discharge: 2019-12-20 | Disposition: A | Discharge status: Home / Self Care | Payer: Medicare HMO | Attending: Emergency Medicine | Admitting: Emergency Medicine

## 2019-12-20 ENCOUNTER — Emergency Department: Payer: Medicare HMO

## 2019-12-20 ENCOUNTER — Other Ambulatory Visit: Payer: Self-pay

## 2019-12-20 ENCOUNTER — Encounter: Payer: Self-pay | Admitting: Emergency Medicine

## 2019-12-20 DIAGNOSIS — I10 Essential (primary) hypertension: Secondary | ICD-10-CM | POA: Insufficient documentation

## 2019-12-20 DIAGNOSIS — E039 Hypothyroidism, unspecified: Secondary | ICD-10-CM | POA: Diagnosis not present

## 2019-12-20 DIAGNOSIS — R600 Localized edema: Secondary | ICD-10-CM | POA: Insufficient documentation

## 2019-12-20 DIAGNOSIS — I4891 Unspecified atrial fibrillation: Secondary | ICD-10-CM | POA: Insufficient documentation

## 2019-12-20 DIAGNOSIS — R609 Edema, unspecified: Secondary | ICD-10-CM

## 2019-12-20 DIAGNOSIS — Z79899 Other long term (current) drug therapy: Secondary | ICD-10-CM | POA: Insufficient documentation

## 2019-12-20 DIAGNOSIS — M7989 Other specified soft tissue disorders: Secondary | ICD-10-CM | POA: Diagnosis present

## 2019-12-20 DIAGNOSIS — Z853 Personal history of malignant neoplasm of breast: Secondary | ICD-10-CM | POA: Insufficient documentation

## 2019-12-20 DIAGNOSIS — Z7901 Long term (current) use of anticoagulants: Secondary | ICD-10-CM | POA: Insufficient documentation

## 2019-12-20 LAB — COMPREHENSIVE METABOLIC PANEL
ALT: 18 U/L (ref 0–44)
AST: 27 U/L (ref 15–41)
Albumin: 3.7 g/dL (ref 3.5–5.0)
Alkaline Phosphatase: 69 U/L (ref 38–126)
Anion gap: 7 (ref 5–15)
BUN: 11 mg/dL (ref 8–23)
CO2: 30 mmol/L (ref 22–32)
Calcium: 8.8 mg/dL — ABNORMAL LOW (ref 8.9–10.3)
Chloride: 103 mmol/L (ref 98–111)
Creatinine, Ser: 0.75 mg/dL (ref 0.44–1.00)
GFR calc Af Amer: >60 mL/min (ref >60)
GFR calc non Af Amer: >60 mL/min (ref >60)
Glucose, Bld: 92 mg/dL (ref 70–99)
Potassium: 4.2 mmol/L (ref 3.5–5.1)
Sodium: 140 mmol/L (ref 135–145)
Total Bilirubin: 0.6 mg/dL (ref 0.3–1.2)
Total Protein: 7.2 g/dL (ref 6.5–8.1)

## 2019-12-20 LAB — CBC WITH DIFFERENTIAL/PLATELET
Abs Immature Granulocytes: 0.02 10*3/uL (ref 0.00–0.07)
Basophils Absolute: 0.1 10*3/uL (ref 0.0–0.1)
Basophils Relative: 1 %
Eosinophils Absolute: 0.1 10*3/uL (ref 0.0–0.5)
Eosinophils Relative: 2 %
HCT: 39.4 % (ref 36.0–46.0)
Hemoglobin: 13.4 g/dL (ref 12.0–15.0)
Immature Granulocytes: 0 %
Lymphocytes Relative: 54 %
Lymphs Abs: 3.9 10*3/uL (ref 0.7–4.0)
MCH: 31.7 pg (ref 26.0–34.0)
MCHC: 34 g/dL (ref 30.0–36.0)
MCV: 93.1 fL (ref 80.0–100.0)
Monocytes Absolute: 0.6 10*3/uL (ref 0.1–1.0)
Monocytes Relative: 8 %
Neutro Abs: 2.5 10*3/uL (ref 1.7–7.7)
Neutrophils Relative %: 35 %
Platelets: 270 10*3/uL (ref 150–400)
RBC: 4.23 MIL/uL (ref 3.87–5.11)
RDW: 13.4 % (ref 11.5–15.5)
WBC: 7.2 10*3/uL (ref 4.0–10.5)
nRBC: 0 % (ref 0.0–0.2)

## 2019-12-20 LAB — BRAIN NATRIURETIC PEPTIDE: B Natriuretic Peptide: 165 pg/mL — ABNORMAL HIGH (ref 0.0–100.0)

## 2019-12-20 NOTE — Discharge Instructions (Signed)
Wear compression stockings on the left at home. Increase ambulation. Keep left lower leg elevated.

## 2019-12-20 NOTE — ED Notes (Signed)
See triage note  Presents with left leg swelling  States she noticed the swelling yesterday

## 2019-12-20 NOTE — ED Provider Notes (Signed)
Emergency Department Provider Note  ____________________________________________  Time seen: Approximately 8:11 PM  I have reviewed the triage vital signs and the nursing notes.   HISTORY  Chief Complaint Leg Swelling   Historian Patient     HPI Suzanne Dixon is a 69 y.o. female with a history of A. fib, peripheral edema and hypertension presents to the emergency department with left lower extremity swelling that started yesterday.  Patient was referred to the emergency department by cardiology for a venous ultrasound.  Patient denies recent surgery and has been sitting more than usual.  She denies daily smoking.  No fever or chills at home.  She denies chest pain, chest tightness or shortness of breath.  No other alleviating measures have been attempted.   Past Medical History:  Diagnosis Date  . A-fib ([place])   . B12 deficiency   . Breast cancer ([place]) 03/31/2013   Right - chemo- mastectomy  . Breast cancer ([place]) 1991   Rt.- radiation  . Complication of anesthesia    Recent years with general anesthesia had itching following surgery  . DDD (degenerative disc disease), cervical   . Depression   . Diverticulitis   . Edema of both legs   . GERD (gastroesophageal reflux disease)   . Gout   . H/O mastectomy 2014   per patient  . H/O: cesarean section 1987   per patient report  . H/O: hysterectomy 1996   per patient  . Hydradenitis   . Hypertension   . Hypothyroidism   . Neuropathy   . 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 Ruptured cervical disc 2002   per patient   . Sleep apnea   . Tachycardia      Immunizations up to date:  Yes.     Past Medical History:  Diagnosis Date  . A-fib ([place])   . B12 deficiency   . Breast cancer ([place]) 03/31/2013   Right - chemo- mastectomy  . Breast cancer ([place]) 1991   Rt.- radiation  . Complication of anesthesia    Recent years with general anesthesia had itching following surgery  . DDD (degenerative disc disease), cervical    . Depression   . Diverticulitis   . Edema of both legs   . GERD (gastroesophageal reflux disease)   . Gout   . H/O mastectomy 2014   per patient  . H/O: cesarean section 1987   per patient report  . H/O: hysterectomy 1996   per patient  . Hydradenitis   . Hypertension   . Hypothyroidism   . Neuropathy   . 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 Ruptured cervical disc 2002   per patient   . Sleep apnea   . Tachycardia     Patient Active Problem List   Diagnosis Date Noted  . 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 Allergic state 04/01/2015  . Arthritis 04/01/2015  . Cancer ([place]) 04/01/2015  . Edema leg 04/01/2015  . Gout 04/01/2015  . Hidradenitis 04/01/2015  . Elevated lymphocyte count 04/01/2015  . Disorder of peripheral nervous system 04/01/2015  . Apnea, sleep 04/01/2015  . Avitaminosis D 04/01/2015  . Breast cancer ([place]) 04/01/2015  . BP (high blood pressure) 04/15/2014  . B12 deficiency 02/24/2014  . A-fib ([place]) 02/17/2014  . Adiposity 06/04/2012  . Depression, major, recurrent, moderate ([place]) 03/26/2012    Past Surgical History:  Procedure Laterality Date  . ABDOMINAL HYSTERECTOMY    . BACK SURGERY  2002  plate and 2 screws in neck  . BREAST BIOPSY Right 1991,2014   Positive  . BREAST CYST ASPIRATION Left   . BREAST SURGERY Right 2014   mastectomy  . CATARACT EXTRACTION W/PHACO Left 05/17/2019   Procedure: CATARACT EXTRACTION PHACO AND INTRAOCULAR LENS PLACEMENT (IOC);  Surgeon: Eulogio Bear, MD;  Location: [facility] ORS;  Service: Ophthalmology;  Laterality: Left;  [place] 00:28 CDE 2.15 FLUID PACK LOT # [id number]  . CATARACT EXTRACTION W/PHACO Right 06/14/2019   Procedure: CATARACT EXTRACTION PHACO AND INTRAOCULAR LENS PLACEMENT ([place]) RIGHT;  Surgeon: Eulogio Bear, MD;  Location: [facility] ORS;  Service: Ophthalmology;  Laterality: Right;  [place] 00:35.0 CDE 2.37 Fluid Pack Lot [id number]  . CESAREAN SECTION    . CHOLECYSTECTOMY  04/09/2012  .  COLONOSCOPY WITH PROPOFOL N/A 06/22/2015   Procedure: COLONOSCOPY WITH PROPOFOL;  Surgeon: Manya Silvas, MD;  Location[facility];  Service: Endoscopy;  Laterality: N/A;  . ESOPHAGOGASTRODUODENOSCOPY    . LAPAROSCOPIC GASTRIC BYPASS    . MASTECTOMY Right   . NEUROPLASTY / TRANSPOSITION ULNAR NERVE AT ELBOW    . PORT-A-CATH REMOVAL Left 05/13/2015   Procedure: REMOVAL PORT-A-CATH LEFT CHEST ;  Surgeon: Florene Glen, MD;  Location: [facility] ORS;  Service: General;  Laterality: Left;  . REDUCTION MAMMAPLASTY Left   . ROUX-EN-Y GASTRIC BYPASS  01/26/2009  . SHOULDER ARTHROSCOPY WITH OPEN ROTATOR CUFF REPAIR Right 01/03/2017   Procedure: SHOULDER ARTHROSCOPY WITH OPEN ROTATOR CUFF REPAIR;  Surgeon: Corky Mull, MD;  Location: [facility] ORS;  Service: Orthopedics;  Laterality: Right;  . SHOULDER ARTHROSCOPY WITH SUBACROMIAL DECOMPRESSION, ROTATOR CUFF REPAIR AND BICEP TENDON REPAIR Right 01/03/2017   Procedure: SHOULDER ARTHROSCOPY WITH SUBACROMIAL DECOMPRESSION, ROTATOR CUFF REPAIR AND BICEP TENDON REPAIR;  Surgeon: Corky Mull, MD;  Location: [facility] ORS;  Service: Orthopedics;  Laterality: Right;  Limited debridement    Prior to Admission medications   Medication Sig Start Date End Date Taking? Authorizing Provider  acetaminophen (TYLENOL) 500 MG tablet Take 500 mg by mouth every 6 (six) hours as needed for moderate pain or headache.    [provider]  allopurinol (ZYLOPRIM) 300 MG tablet Take 300 mg by mouth daily.     [provider]  azelastine (ASTELIN) 0.1 % nasal spray Place 1 spray into both nostrils 2 (two) times daily as needed for rhinitis or allergies.  09/30/16   [provider]  Calcium Carb-Cholecalciferol (SM CALCIUM-VITAMIN D) 600-400 MG-UNIT TABS Take 1 tablet by mouth 2 (two) times daily.     [provider]  DULoxetine (CYMBALTA) 60 MG capsule Take 60 mg by mouth 2 (two) times daily.    [provider]  fluticasone (FLONASE) 50  MCG/ACT nasal spray Place 2 sprays into both nostrils daily as needed for allergies.     [provider]  gabapentin (NEURONTIN) 300 MG capsule Take 300 mg by mouth 3 (three) times daily. 300 mg and a 600 mg to equal 900 mg three times daily    [provider]  gabapentin (NEURONTIN) 600 MG tablet Take 600 mg by mouth 3 (three) times daily. Takes a 300 mg and 600 mg to equal 900 mg three times daily 03/09/15   [provider]  levothyroxine (SYNTHROID, LEVOTHROID) 50 MCG tablet Take 50 mcg by mouth daily before breakfast.    [provider]  Magnesium 400 MG CAPS Take 400 mg by mouth daily.    [provider]  meloxicam (MOBIC) 15 MG tablet Take 15  mg by mouth daily.    [provider]  metoprolol (LOPRESSOR) 100 MG tablet Take 200 mg by mouth 2 (two) times daily.     [provider]  Multiple Vitamins tablet Take 2 tablets by mouth daily.     [provider]  omeprazole (PRILOSEC) 20 MG capsule Take 20 mg by mouth daily.     [provider]  oxybutynin (DITROPAN) 5 MG tablet Take 5 mg by mouth 2 (two) times daily.    [provider]  potassium chloride (K-DUR,KLOR-CON) 10 MEQ tablet Take 10 mEq by mouth 3 (three) times daily.     [provider]  vitamin B-12 (CYANOCOBALAMIN) 500 MCG tablet Take 500 mcg by mouth daily.    [provider]  warfarin (COUMADIN) 6 MG tablet Take 6 mg by mouth at bedtime.    [provider]    Allergies Advair diskus [fluticasone-salmeterol], Alendronate, Singulair [montelukast], Sulfa antibiotics, and Venlafaxine  Family History  Problem Relation Age of Onset  . Hypertension Mother   . Lymphoma Mother   . Cancer Sister        Breast  . Breast cancer Sister 40    Social History Social History   Tobacco Use  . Smoking status: Never Smoker  . Smokeless tobacco: Never Used  Substance Use Topics  . Alcohol use: No    Alcohol/week: 0.0  standard drinks  . Drug use: No     Review of Systems  Constitutional: No fever/chills Eyes:  No discharge ENT: No upper respiratory complaints. Respiratory: no cough. No SOB/ use of accessory muscles to breath Gastrointestinal:   No nausea, no vomiting.  No diarrhea.  No constipation. Musculoskeletal: Negative for musculoskeletal pain. Skin: Patient has left lower extremity edema.   ____________________________________________   PHYSICAL EXAM:  VITAL SIGNS: ED Triage Vitals  Enc Vitals Group     BP 12/20/19 1638 (!) 176/101     Pulse Rate 12/20/19 1638 65     Resp 12/20/19 1638 18     Temp 12/20/19 1638 98.6 F (37 C)     Temp Source 12/20/19 1638 Oral     SpO2 12/20/19 1638 97 %     Weight 12/20/19 1635 290 lb (131.5 kg)     Height 12/20/19 1635 5\' 4"  (1.626 m)     Head Circumference --      Peak Flow --      Pain Score 12/20/19 1635 0     Pain Loc --      Pain Edu? --      Excl. in [place]? --      Constitutional: Alert and oriented. Well appearing and in no acute distress. Eyes: Conjunctivae are normal. PERRL. EOMI. Head: Atraumatic. Cardiovascular: Normal rate, regular rhythm. Normal S1 and S2.  Good peripheral circulation. Respiratory: Normal respiratory effort without tachypnea or retractions. Lungs CTAB. Good air entry to the bases with no decreased or absent breath sounds Gastrointestinal: Bowel sounds x 4 quadrants. Soft and nontender to palpation. No guarding or rigidity. No distention. Musculoskeletal: Full range of motion to all extremities. No obvious deformities noted Neurologic:  Normal for age. No gross focal neurologic deficits are appreciated.  Skin: Patient has 3+ pitting edema left ankle.  No erythema. Psychiatric: Mood and affect are normal for age. Speech and behavior are normal.   ____________________________________________   LABS (all labs ordered are listed, but only abnormal results are displayed)  Labs Reviewed  COMPREHENSIVE METABOLIC  PANEL - Abnormal; Notable for  the following components:      Result Value   Calcium 8.8 (*)    All other components within normal limits  BRAIN NATRIURETIC PEPTIDE - Abnormal; Notable for the following components:   B Natriuretic Peptide 165.0 (*)    All other components within normal limits  CBC WITH DIFFERENTIAL/PLATELET   ____________________________________________  EKG   ____________________________________________  RADIOLOGY Unk Pinto, personally viewed and evaluated these images (plain radiographs) as part of my medical decision making, as well as reviewing the written report by the radiologist.  US Venous Img Lower Unilateral Left  Result Date: 12/20/2019 CLINICAL DATA:  Left lower extremity edema for 2 days EXAM: Left LOWER EXTREMITY VENOUS DOPPLER ULTRASOUND TECHNIQUE: Gray-scale sonography with compression, as well as color and duplex ultrasound, were performed to evaluate the deep venous system(s) from the level of the common femoral vein through the popliteal and proximal calf veins. COMPARISON:  None. FINDINGS: VENOUS Normal compressibility of the common femoral, superficial femoral, and popliteal veins, as well as the visualized calf veins. Visualized portions of profunda femoral vein and great saphenous vein unremarkable. No filling defects to suggest DVT on grayscale or color Doppler imaging. Doppler waveforms show normal direction of venous flow, normal respiratory phasicity and response to augmentation. Limited views of the contralateral common femoral vein are unremarkable. OTHER Septated cyst in the left popliteal fossa measuring 5 x 2.6 x 3.2 cm. Limitations: none IMPRESSION: No femoropopliteal DVT nor evidence of DVT within the visualized calf veins. If clinical symptoms are inconsistent or if there are persistent or worsening symptoms, further imaging (possibly involving the iliac veins) may be warranted. 5 cm septated popliteal fossa cyst. Electronically Signed    By: Donavan Foil M.D.   On: 12/20/2019 17:35    ____________________________________________    PROCEDURES  Procedure(s) performed:     Procedures     Medications - No data to display   ____________________________________________   INITIAL IMPRESSION / ASSESSMENT AND PLAN / ED COURSE  Pertinent labs & imaging results that were available during my care of the patient were reviewed by me and considered in my medical decision making (see chart for details).      Assessment and plan Left lower extremity swelling 69 year old female presents to the emergency department with left lower extremity swelling that started yesterday.  Patient was hypertensive at triage but vital signs were otherwise reassuring.  She had 3+ pitting edema of the left ankle without erythema.  Differential diagnosis included venous insufficiency, DVT, cellulitis...  Venous ultrasound revealed no evidence of DVT.  CBC and CMP were reassuring.  BNP was mildly elevated.  Continues to deny shortness of breath, chest tightness or orthopnea..  Compression stockings were recommended at home as well as increase ambulation.  She was advised to keep left lower extremity elevated when in a sitting position.  I recommended patient to follow-up with her primary care provider if left-sided peripheral edema persist.  She voiced understanding and has easy access to the ED should her symptoms worsen.     ____________________________________________  FINAL CLINICAL IMPRESSION(S) / ED DIAGNOSES  Final diagnoses:  Peripheral edema      NEW MEDICATIONS STARTED DURING THIS VISIT:  ED Discharge Orders    None          This chart was dictated using voice recognition software/Dragon. Despite best efforts to proofread, errors can occur which can change the meaning. Any change was purely unintentional.     Lannie Fields, PA-C 12/20/19  2015    Earleen Newport, MD 12/20/19 2318

## 2019-12-20 NOTE — ED Triage Notes (Signed)
Pt has left leg swelling since yesterday. Dr Ubaldo Glassing wanted pt to come to r/o DVT per pt.  No pain.  NAD.

## 2019-12-31 ENCOUNTER — Ambulatory Visit (INDEPENDENT_AMBULATORY_CARE_PROVIDER_SITE_OTHER): Payer: Medicare HMO | Admitting: Vascular Surgery

## 2019-12-31 ENCOUNTER — Encounter (INDEPENDENT_AMBULATORY_CARE_PROVIDER_SITE_OTHER): Payer: Self-pay | Admitting: Vascular Surgery

## 2019-12-31 ENCOUNTER — Other Ambulatory Visit: Payer: Self-pay

## 2019-12-31 VITALS — BP 115/68 | HR 120 | Resp 16 | Ht 64.0 in | Wt 296.0 lb

## 2019-12-31 DIAGNOSIS — R600 Localized edema: Secondary | ICD-10-CM | POA: Insufficient documentation

## 2019-12-31 DIAGNOSIS — G63 Polyneuropathy in diseases classified elsewhere: Secondary | ICD-10-CM | POA: Insufficient documentation

## 2019-12-31 DIAGNOSIS — M7989 Other specified soft tissue disorders: Secondary | ICD-10-CM | POA: Insufficient documentation

## 2019-12-31 DIAGNOSIS — I4891 Unspecified atrial fibrillation: Secondary | ICD-10-CM

## 2019-12-31 DIAGNOSIS — I10 Essential (primary) hypertension: Secondary | ICD-10-CM | POA: Diagnosis not present

## 2019-12-31 NOTE — Assessment & Plan Note (Signed)
The patient has lower extremity swelling that is significant and impairs their normal activities. Discussed the multiple causes of lower extremity swelling. We discussed the pathophysiology and natural history of both venous disease and lymphedema as potential vascular causes of lower extremity swelling. We will obtain a venous reflux study at the patient's convenience in the near future. We will see the patient back following the study to discuss the results and determine further treatment options. Conservative therapies for leg swelling would include increasing exercise, leg elevation, weight loss, and the daily use of graduated compression stockings 20-30 mmHg and strength.  Due to the shape and size of her leg, compression stockings have not been able to fit her leg up to this point, so we are going to prescribe her the juxta lite Velcro compression system.  We discussed using the compression system on a daily basis starting first thing in the morning and wearing them throughout the day. These should be worn daily.  She will follow-up after her noninvasive studies to discuss the results and determine further treatment options.  At this time, we can discuss whether or not laser ablation would be of benefit or if a lymphedema pump would be a good adjuvant therapy.

## 2019-12-31 NOTE — Patient Instructions (Signed)

## 2019-12-31 NOTE — Assessment & Plan Note (Signed)
On anticoagulation 

## 2019-12-31 NOTE — Progress Notes (Signed)
Patient ID: Suzanne Dixon, female   DOB: 04-13-1951, 69 y.o.   MRN: [id number]  Chief Complaint  Patient presents with  . New Patient (Initial Visit)    ref Caryl Comes lle edema    HPI Suzanne Dixon is a 69 y.o. female.  I am asked to see the patient by Dr. Caryl Comes for evaluation of leg leg swelling.  The patient says the swelling came on out of the blue with no clear inciting event or causative factor a week or 2 ago.  It is better today than it was initially, but still remains swollen in the left leg.  No previous history of swelling.  She does have significant arthritis in both legs.  No right leg symptoms.  No ulceration or infection.  There is some dark discoloration in the lower part of the left leg.  She had a DVT study done last week which showed no DVT but a significant Baker's cyst was present septated and 5 cm in size.     Past Medical History:  Diagnosis Date  . A-fib ([place])   . B12 deficiency   . Breast cancer ([place]) 03/31/2013   Right - chemo- mastectomy  . Breast cancer ([place]) 1991   Rt.- radiation  . Complication of anesthesia    Recent years with general anesthesia had itching following surgery  . DDD (degenerative disc disease), cervical   . Depression   . Diverticulitis   . Edema of both legs   . GERD (gastroesophageal reflux disease)   . Gout   . H/O mastectomy 2014   per patient  . H/O: cesarean section 1987   per patient report  . H/O: hysterectomy 1996   per patient  . Hydradenitis   . Hypertension   . Hypothyroidism   . Neuropathy   . 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 Ruptured cervical disc 2002   per patient   . Sleep apnea   . Tachycardia     Past Surgical History:  Procedure Laterality Date  . ABDOMINAL HYSTERECTOMY    . BACK SURGERY  2002   plate and 2 screws in neck  . BREAST BIOPSY Right 1991,2014   Positive  . BREAST CYST ASPIRATION Left   . BREAST SURGERY Right 2014   mastectomy  . CATARACT EXTRACTION W/PHACO Left 05/17/2019   Procedure: CATARACT EXTRACTION PHACO AND INTRAOCULAR LENS PLACEMENT (I[facility]);  Surgeon: Eulogio Bear, MD;  Location: [facility] ORS;  Service: Ophthalmology;  Laterality: Left;  [place] 00:28 CDE 2.15 FLUID PACK LOT # [id number]  . CATARACT EXTRACTION W/PHACO Right 06/14/2019   Procedure: CATARACT EXTRACTION PHACO AND INTRAOCULAR LENS PLACEMENT ([place]) RIGHT;  Surgeon: Eulogio Bear, MD;  Location: [facility] ORS;  Service: Ophthalmology;  Laterality: Right;  [place] 00:35.0 CDE 2.37 Fluid Pack Lot [id number]  . CESAREAN SECTION    . CHOLECYSTECTOMY  04/09/2012  . COLONOSCOPY WITH PROPOFOL N/A 06/22/2015   Procedure: COLONOSCOPY WITH PROPOFOL;  Surgeon: Manya Silvas, MD;  Location: [facility] ENDOSCOPY;  Service: Endoscopy;  Laterality: N/A;  . ESOPHAGOGASTRODUODENOSCOPY    . LAPAROSCOPIC GASTRIC BYPASS    . MASTECTOMY Right   . NEUROPLASTY / TRANSPOSITION ULNAR NERVE AT ELBOW    . PORT-A-CATH REMOVAL Left 05/13/2015   Procedure: REMOVAL PORT-A-CATH LEFT CHEST ;  Surgeon: Florene Glen, MD;  Location: [facility] ORS;  Service: General;  Laterality: Left;  . REDUCTION MAMMAPLASTY Left   . ROUX-EN-Y GASTRIC BYPASS  01/26/2009  . SHOULDER ARTHROSCOPY WITH OPEN  ROTATOR CUFF REPAIR Right 01/03/2017   Procedure: SHOULDER ARTHROSCOPY WITH OPEN ROTATOR CUFF REPAIR;  Surgeon: Corky Mull, MD;  Location: [facility] ORS;  Service: Orthopedics;  Laterality: Right;  . SHOULDER ARTHROSCOPY WITH SUBACROMIAL DECOMPRESSION, ROTATOR CUFF REPAIR AND BICEP TENDON REPAIR Right 01/03/2017   Procedure: SHOULDER ARTHROSCOPY WITH SUBACROMIAL DECOMPRESSION, ROTATOR CUFF REPAIR AND BICEP TENDON REPAIR;  Surgeon: Corky Mull, MD;  Location: [facility] ORS;  Service: Orthopedics;  Laterality: Right;  Limited debridement     Family History  Problem Relation Age of Onset  . Hypertension Mother   . Lymphoma Mother   . Cancer Sister        Breast  . Breast cancer Sister 66    Social History   Tobacco Use  . Smoking status: Never Smoker  . Smokeless  tobacco: Never Used  Substance Use Topics  . Alcohol use: No    Alcohol/week: 0.0 standard drinks  . Drug use: No     Allergies  Allergen Reactions  . Advair Diskus [Fluticasone-Salmeterol] Other (See Comments)    Joint pain   . Alendronate     Muscle pain  . Singulair [Montelukast] Other (See Comments)    "muscle pain"  . Sulfa Antibiotics Hives  . Venlafaxine Other (See Comments)    "altered mental status/tremors"    Current Outpatient Medications  Medication Sig Dispense Refill  . acetaminophen (TYLENOL) 500 MG tablet Take 500 mg by mouth every 6 (six) hours as needed for moderate pain or headache.    . allopurinol (ZYLOPRIM) 300 MG tablet Take 300 mg by mouth daily.     Marland Kitchen azelastine (ASTELIN) 0.1 % nasal spray Place 1 spray into both nostrils 2 (two) times daily as needed for rhinitis or allergies.     . Calcium Carb-Cholecalciferol (SM CALCIUM-VITAMIN D) 600-400 MG-UNIT TABS Take 1 tablet by mouth 2 (two) times daily.     . DULoxetine (CYMBALTA) 60 MG capsule Take 60 mg by mouth 2 (two) times daily.    . fluticasone (FLONASE) 50 MCG/ACT nasal spray Place 2 sprays into both nostrils daily as needed for allergies.     Marland Kitchen gabapentin (NEURONTIN) 600 MG tablet Take 600 mg by mouth in the morning, at noon, in the evening, and at bedtime. Takes a 300 mg and 600 mg to equal 900 mg three times daily  11  . levothyroxine (SYNTHROID, LEVOTHROID) 50 MCG tablet Take 50 mcg by mouth daily before breakfast.    . Magnesium 400 MG CAPS Take 400 mg by mouth daily.    . meloxicam (MOBIC) 15 MG tablet Take 15 mg by mouth daily.    . metoprolol (LOPRESSOR) 100 MG tablet Take 200 mg by mouth 2 (two) times daily.     . Multiple Vitamins tablet Take 2 tablets by mouth daily.     Marland Kitchen omeprazole (PRILOSEC) 20 MG capsule Take 20 mg by mouth daily.     Marland Kitchen oxybutynin (DITROPAN) 5 MG tablet Take 5 mg by mouth 2 (two) times daily.    . potassium chloride (K-DUR,KLOR-CON) 10 MEQ tablet Take 10 mEq by mouth 3  (three) times daily.     . propranolol (INDERAL) 10 MG tablet Take 10 mg by mouth 2 (two) times daily.    . vitamin B-12 (CYANOCOBALAMIN) 500 MCG tablet Take 500 mcg by mouth daily.    Marland Kitchen warfarin (COUMADIN) 6 MG tablet Take 6 mg by mouth at bedtime.    . gabapentin (NEURONTIN) 300 MG capsule Take 300 mg by  mouth 3 (three) times daily. 300 mg and a 600 mg to equal 900 mg three times daily     No current facility-administered medications for this visit.      REVIEW OF SYSTEMS (Negative unless checked)  Constitutional: [] Weight loss  [] Fever  [] Chills Cardiac: [] Chest pain   [] Chest pressure   [] Palpitations   [] Shortness of breath when laying flat   [] Shortness of breath at rest   [] Shortness of breath with exertion. Vascular:  [] Pain in legs with walking   [] Pain in legs at rest   [] Pain in legs when laying flat   [] Claudication   [] Pain in feet when walking  [] Pain in feet at rest  [] Pain in feet when laying flat   [] History of DVT   [] Phlebitis   [] Swelling in legs   [] Varicose veins   [] Non-healing ulcers Pulmonary:   [] Uses home oxygen   [] Productive cough   [] Hemoptysis   [] Wheeze  [] COPD   [] Asthma Neurologic:  [] Dizziness  [] Blackouts   [] Seizures   [] History of stroke   [] History of TIA  [] Aphasia   [] Temporary blindness   [] Dysphagia   [] Weakness or numbness in arms   [] Weakness or numbness in legs Musculoskeletal:  [] Arthritis   [] Joint swelling   [] Joint pain   [] Low back pain Hematologic:  [] Easy bruising  [] Easy bleeding   [] Hypercoagulable state   [] Anemic  [] Hepatitis Gastrointestinal:  [] Blood in stool   [] Vomiting blood  [] Gastroesophageal reflux/heartburn   [] Abdominal pain Genitourinary:  [] Chronic kidney disease   [] Difficult urination  [] Frequent urination  [] Burning with urination   [] Hematuria Skin:  [] Rashes   [] Ulcers   [] Wounds Psychological:  [] History of anxiety   []  History of major depression.    Physical Exam BP 115/68 (BP Location: Left Arm)   Pulse (!) 120    Resp 16   Ht 5\' 4"  (1.626 m)   Wt 296 lb (134.3 kg)   BMI 50.81 kg/m  Gen:  WD/WN, NAD.  Obese. Head: [place]/AT, No temporalis wasting.  Ear/Nose/Throat: Hearing grossly intact, nares w/o erythema or drainage, oropharynx w/o Erythema/Exudate Eyes: Conjunctiva clear, sclera non-icteric  Neck: trachea midline.  No JVD.  Pulmonary:  Good air movement, respirations not labored, no use of accessory muscles  Cardiac: irregular and tachycardic Vascular:  Vessel Right Left  Radial Palpable Palpable                          PT  1+  NP  DP  2+  1+   Gastrointestinal:. No masses, surgical incisions, or scars. Musculoskeletal: M/S 5/5 throughout.  Extremities without ischemic changes.  No deformity or atrophy.  2+ left lower extremity edema.  In a wheelchair. Neurologic: Sensation grossly intact in extremities.  Symmetrical.  Speech is fluent. Motor exam as listed above. Psychiatric: Judgment intact, Mood & affect appropriate for pt's clinical situation. Dermatologic: No rashes or ulcers noted.  No cellulitis or open wounds.    Radiology US Venous Img Lower Unilateral Left  Result Date: 12/20/2019 CLINICAL DATA:  Left lower extremity edema for 2 days EXAM: Left LOWER EXTREMITY VENOUS DOPPLER ULTRASOUND TECHNIQUE: Gray-scale sonography with compression, as well as color and duplex ultrasound, were performed to evaluate the deep venous system(s) from the level of the common femoral vein through the popliteal and proximal calf veins. COMPARISON:  None. FINDINGS: VENOUS Normal compressibility of the common femoral, superficial femoral, and popliteal veins, as well as the visualized calf veins. Visualized  portions of profunda femoral vein and great saphenous vein unremarkable. No filling defects to suggest DVT on grayscale or color Doppler imaging. Doppler waveforms show normal direction of venous flow, normal respiratory phasicity and response to augmentation. Limited views of the contralateral  common femoral vein are unremarkable. OTHER Septated cyst in the left popliteal fossa measuring 5 x 2.6 x 3.2 cm. Limitations: none IMPRESSION: No femoropopliteal DVT nor evidence of DVT within the visualized calf veins. If clinical symptoms are inconsistent or if there are persistent or worsening symptoms, further imaging (possibly involving the iliac veins) may be warranted. 5 cm septated popliteal fossa cyst. Electronically Signed   By: Donavan Foil M.D.   On: 12/20/2019 17:35    Labs Recent Results (from the past 2160 hour(s))  CBC with Differential     Status: None   Collection Time: 12/20/19  6:46 PM  Result Value Ref Range   WBC 7.2 4.0 - 10.5 K/uL   RBC 4.23 3.87 - 5.11 MIL/uL   Hemoglobin 13.4 12.0 - 15.0 g/dL   HCT 39.4 36.0 - 46.0 %   MCV 93.1 80.0 - 100.0 fL   MCH 31.7 26.0 - 34.0 pg   MCHC 34.0 30.0 - 36.0 g/dL   RDW 13.4 11.5 - 15.5 %   Platelets 270 150 - 400 K/uL   nRBC 0.0 0.0 - 0.2 %   Neutrophils Relative % 35 %   Neutro Abs 2.5 1.7 - 7.7 K/uL   Lymphocytes Relative 54 %   Lymphs Abs 3.9 0.7 - 4.0 K/uL   Monocytes Relative 8 %   Monocytes Absolute 0.6 0.1 - 1.0 K/uL   Eosinophils Relative 2 %   Eosinophils Absolute 0.1 0.0 - 0.5 K/uL   Basophils Relative 1 %   Basophils Absolute 0.1 0.0 - 0.1 K/uL   Immature Granulocytes 0 %   Abs Immature Granulocytes 0.02 0.00 - 0.07 K/uL    Comment: Performed at [facility], [address] [place], [place] [address]  Comprehensive metabolic panel     Status: Abnormal   Collection Time: 12/20/19  6:46 PM  Result Value Ref Range   Sodium 140 135 - 145 mmol/L   Potassium 4.2 3.5 - 5.1 mmol/L   Chloride 103 98 - 111 mmol/L   CO2 30 22 - 32 mmol/L   Glucose, Bld 92 70 - 99 mg/dL    Comment: Glucose reference range applies only to samples taken after fasting for at least 8 hours.   BUN 11 8 - 23 mg/dL   Creatinine, Ser 0.75 0.44 - 1.00 mg/dL   Calcium 8.8 (L) 8.9 - 10.3 mg/dL   Total Protein 7.2 6.5 - 8.1 g/dL     Albumin 3.7 3.5 - 5.0 g/dL   AST 27 15 - 41 U/L   ALT 18 0 - 44 U/L   Alkaline Phosphatase 69 38 - 126 U/L   Total Bilirubin 0.6 0.3 - 1.2 mg/dL   GFR calc non Af Amer >60 >60 mL/min   GFR calc Af Amer >60 >60 mL/min   Anion gap 7 5 - 15    Comment: Performed at [facility], [address] [place], [place] [address]  Brain natriuretic peptide     Status: Abnormal   Collection Time: 12/20/19  6:46 PM  Result Value Ref Range   B Natriuretic Peptide 165.0 (H) 0.0 - 100.0 pg/mL    Comment: Performed at [facility], [address] [place], [place] [address]  Assessment/Plan:  BP (high blood pressure) blood pressure control important in reducing the progression of atherosclerotic disease. On appropriate oral medications.   A-fib On anticoagulation  Swelling of limb The patient has lower extremity swelling that is significant and impairs their normal activities. Discussed the multiple causes of lower extremity swelling. We discussed the pathophysiology and natural history of both venous disease and lymphedema as potential vascular causes of lower extremity swelling. We will obtain a venous reflux study at the patient's convenience in the near future. We will see the patient back following the study to discuss the results and determine further treatment options. Conservative therapies for leg swelling would include increasing exercise, leg elevation, weight loss, and the daily use of graduated compression stockings 20-30 mmHg and strength.  Due to the shape and size of her leg, compression stockings have not been able to fit her leg up to this point, so we are going to prescribe her the juxta lite Velcro compression system.  We discussed using the compression system on a daily basis starting first thing in the morning and wearing them throughout the day. These should be worn daily.  She will follow-up after her noninvasive studies to discuss the results and determine  further treatment options.  At this time, we can discuss whether or not laser ablation would be of benefit or if a lymphedema pump would be a good adjuvant therapy.      Leotis Pain 12/31/2019, 12:38 PM   This note was created with Dragon medical transcription system.  Any errors from dictation are unintentional.

## 2019-12-31 NOTE — Assessment & Plan Note (Signed)
blood pressure control important in reducing the progression of atherosclerotic disease. On appropriate oral medications.  

## 2020-01-15 ENCOUNTER — Ambulatory Visit (INDEPENDENT_AMBULATORY_CARE_PROVIDER_SITE_OTHER): Payer: Medicare HMO | Admitting: Nurse Practitioner

## 2020-01-15 ENCOUNTER — Ambulatory Visit (INDEPENDENT_AMBULATORY_CARE_PROVIDER_SITE_OTHER): Payer: Medicare HMO

## 2020-01-15 ENCOUNTER — Encounter (INDEPENDENT_AMBULATORY_CARE_PROVIDER_SITE_OTHER): Payer: Self-pay | Admitting: Nurse Practitioner

## 2020-01-15 ENCOUNTER — Other Ambulatory Visit: Payer: Self-pay

## 2020-01-15 VITALS — BP 180/107 | HR 70 | Resp 16 | Wt 297.8 lb

## 2020-01-15 DIAGNOSIS — I890 Lymphedema, not elsewhere classified: Secondary | ICD-10-CM

## 2020-01-15 DIAGNOSIS — R600 Localized edema: Secondary | ICD-10-CM

## 2020-01-15 DIAGNOSIS — I872 Venous insufficiency (chronic) (peripheral): Secondary | ICD-10-CM | POA: Diagnosis not present

## 2020-01-15 DIAGNOSIS — M7989 Other specified soft tissue disorders: Secondary | ICD-10-CM

## 2020-01-15 DIAGNOSIS — I10 Essential (primary) hypertension: Secondary | ICD-10-CM

## 2020-01-19 ENCOUNTER — Encounter (INDEPENDENT_AMBULATORY_CARE_PROVIDER_SITE_OTHER): Payer: Self-pay | Admitting: Nurse Practitioner

## 2020-01-19 NOTE — Progress Notes (Signed)
Subjective:    Patient ID: Suzanne Dixon, female    DOB: September 27, 1950, 69 y.o.   MRN: [id number] Chief Complaint  Patient presents with  . Follow-up    ultrasound follow up    The patient returns to the office for followup evaluation regarding leg swelling.  The swelling has persisted and the pain associated with swelling continues. There have not been any interval development of a ulcerations or wounds.  Since the previous visit the patient has been wearing graduated compression stockings and has noted some improvement in the lymphedema. The patient has been using compression routinely morning until night.  The patient also states elevation during the day and exercise is being done too.   Today the patient underwent noninvasive studies.  Patient had evidence of deep venous reflux in the common femoral vein.  There was also evidence in the great saphenous vein at the saphenofemoral junction.  Patient had no evidence of DVT or superficial venous thrombosis bilaterally.   Review of Systems     Objective:   Physical Exam  BP (!) 180/107 (BP Location: Left Arm)   Pulse 70   Resp 16   Wt 297 lb 12.8 oz (135.1 kg)   BMI 51.12 kg/m   Past Medical History:  Diagnosis Date  . A-fib ([place])   . B12 deficiency   . Breast cancer ([place]) 03/31/2013   Right - chemo- mastectomy  . Breast cancer ([place]) 1991   Rt.- radiation  . Complication of anesthesia    Recent years with general anesthesia had itching following surgery  . DDD (degenerative disc disease), cervical   . Depression   . Diverticulitis   . Edema of both legs   . GERD (gastroesophageal reflux disease)   . Gout   . H/O mastectomy 2014   per patient  . H/O: cesarean section 1987   per patient report  . H/O: hysterectomy 1996   per patient  . Hydradenitis   . Hypertension   . Hypothyroidism   . Neuropathy   . 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 Ruptured cervical disc 2002   per patient   . Sleep apnea   . Tachycardia       Social History   Socioeconomic History  . Marital status: Widowed    Spouse name: Not on file  . Number of children: Not on file  . Years of education: Not on file  . Highest education level: Not on file  Occupational History  . Not on file  Tobacco Use  . Smoking status: Never Smoker  . Smokeless tobacco: Never Used  Substance and Sexual Activity  . Alcohol use: No    Alcohol/week: 0.0 standard drinks  . Drug use: No  . Sexual activity: Not on file  Other Topics Concern  . Not on file  Social History Narrative  . Not on file   Social Determinants of Health   Financial Resource Strain:   . Difficulty of Paying Living Expenses:   Food Insecurity:   . Worried About Charity fundraiser in the Last Year:   . Arboriculturist in the Last Year:   Transportation Needs:   . Film/video editor (Medical):   Marland Kitchen Lack of Transportation (Non-Medical):   Physical Activity:   . Days of Exercise per Week:   . Minutes of Exercise per Session:   Stress:   . Feeling of Stress :   Social Connections:   . Frequency of Communication  with Friends and Family:   . Frequency of Social Gatherings with Friends and Family:   . Attends Religious Services:   . Active Member of Clubs or Organizations:   . Attends Archivist Meetings:   Marland Kitchen Marital Status:   Intimate Partner Violence:   . Fear of Current or Ex-Partner:   . Emotionally Abused:   Marland Kitchen Physically Abused:   . Sexually Abused:     Past Surgical History:  Procedure Laterality Date  . ABDOMINAL HYSTERECTOMY    . BACK SURGERY  2002   plate and 2 screws in neck  . BREAST BIOPSY Right 1991,2014   Positive  . BREAST CYST ASPIRATION Left   . BREAST SURGERY Right 2014   mastectomy  . CATARACT EXTRACTION W/PHACO Left 05/17/2019   Procedure: CATARACT EXTRACTION PHACO AND INTRAOCULAR LENS PLACEMENT ([facility]);  Surgeon: Eulogio Bear, MD;  Location: [facility] ORS;  Service: Ophthalmology;  Laterality: Left;  [place] 00:28 CDE  2.15 FLUID PACK LOT # [id number]  . CATARACT EXTRACTION W/PHACO Right 06/14/2019   Procedure: CATARACT EXTRACTION PHACO AND INTRAOCULAR LENS PLACEMENT ([place]) RIGHT;  Surgeon: Eulogio Bear, MD;  Location: [facility] ORS;  Service: Ophthalmology;  Laterality: Right;  [place] 00:35.0 CDE 2.37 Fluid Pack Lot [id number]  . CESAREAN SECTION    . CHOLECYSTECTOMY  04/09/2012  . COLONOSCOPY WITH PROPOFOL N/A 06/22/2015   Procedure: COLONOSCOPY WITH PROPOFOL;  Surgeon: Manya Silvas, MD;  Location: [facility] ENDOSCOPY;  Service: Endoscopy;  Laterality: N/A;  . ESOPHAGOGASTRODUODENOSCOPY    . LAPAROSCOPIC GASTRIC BYPASS    . MASTECTOMY Right   . NEUROPLASTY / TRANSPOSITION ULNAR NERVE AT ELBOW    . PORT-A-CATH REMOVAL Left 05/13/2015   Procedure: REMOVAL PORT-A-CATH LEFT CHEST ;  Surgeon: Florene Glen, MD;  Location: [facility] ORS;  Service: General;  Laterality: Left;  . REDUCTION MAMMAPLASTY Left   . ROUX-EN-Y GASTRIC BYPASS  01/26/2009  . SHOULDER ARTHROSCOPY WITH OPEN ROTATOR CUFF REPAIR Right 01/03/2017   Procedure: SHOULDER ARTHROSCOPY WITH OPEN ROTATOR CUFF REPAIR;  Surgeon: Corky Mull, MD;  Location: [facility] ORS;  Service: Orthopedics;  Laterality: Right;  . SHOULDER ARTHROSCOPY WITH SUBACROMIAL DECOMPRESSION, ROTATOR CUFF REPAIR AND BICEP TENDON REPAIR Right 01/03/2017   Procedure: SHOULDER ARTHROSCOPY WITH SUBACROMIAL DECOMPRESSION, ROTATOR CUFF REPAIR AND BICEP TENDON REPAIR;  Surgeon: Corky Mull, MD;  Location: [facility] ORS;  Service: Orthopedics;  Laterality: Right;  Limited debridement    Family History  Problem Relation Age of Onset  . Hypertension Mother   . Lymphoma Mother   . Cancer Sister        Breast  . Breast cancer Sister 70    Allergies  Allergen Reactions  . Advair Diskus [Fluticasone-Salmeterol] Other (See Comments)    Joint pain   . Alendronate     Muscle pain  . Singulair [Montelukast] Other (See Comments)    "muscle pain"  . Sulfa Antibiotics Hives  . Venlafaxine Other (See  Comments)    "altered mental status/tremors"       Assessment & Plan:   1. Lymphedema No surgery or intervention at this point in time.    I have reviewed my discussion with the patient regarding venous insufficiency and secondary lymph edema and why it  causes symptoms. I have discussed with the patient the chronic skin changes that accompany these problems and the long term sequela such as ulceration and infection.  Patient will continue wearing graduated compression stockings class 1 (20-30 mmHg) on a daily basis a  prescription was given to the patient to keep this updated. The patient will  put the stockings on first thing in the morning and removing them in the evening. The patient is instructed specifically not to sleep in the stockings.  In addition, behavioral modification including elevation during the day will be continued.  Diet and salt restriction was also discussed.  Following the review of the ultrasound the patient will follow up in 6 months to reassess the degree of swelling and the control that graduated compression is offering.   The patient can be assessed for a Lymph Pump at that time.  However, at this time the patient states they are satisfied with the control compression and elevation is yielding.   Patient will follow up in 6 months for evaluation of conservative therapy.  However patient will follow up sooner if necessary. 2. Essential hypertension Blood pressure slightly elevated today.  This is likely due to combination of stressors between tests.  We will continue to take current medication no changes made today.  3. Chronic venous insufficiency Patient will continue with conservative therapy as outlined above.   Current Outpatient Medications on File Prior to Visit  Medication Sig Dispense Refill  . acetaminophen (TYLENOL) 500 MG tablet Take 500 mg by mouth every 6 (six) hours as needed for moderate pain or headache.    . allopurinol (ZYLOPRIM) 300 MG tablet  Take 300 mg by mouth daily.     Marland Kitchen azelastine (ASTELIN) 0.1 % nasal spray Place 1 spray into both nostrils 2 (two) times daily as needed for rhinitis or allergies.     . Calcium Carb-Cholecalciferol (SM CALCIUM-VITAMIN D) 600-400 MG-UNIT TABS Take 1 tablet by mouth 2 (two) times daily.     . DULoxetine (CYMBALTA) 60 MG capsule Take 60 mg by mouth 2 (two) times daily.    . fluticasone (FLONASE) 50 MCG/ACT nasal spray Place 2 sprays into both nostrils daily as needed for allergies.     Marland Kitchen gabapentin (NEURONTIN) 300 MG capsule Take 300 mg by mouth 3 (three) times daily. 300 mg and a 600 mg to equal 900 mg three times daily    . gabapentin (NEURONTIN) 600 MG tablet Take 600 mg by mouth in the morning, at noon, in the evening, and at bedtime. Takes a 300 mg and 600 mg to equal 900 mg three times daily  11  . levothyroxine (SYNTHROID, LEVOTHROID) 50 MCG tablet Take 50 mcg by mouth daily before breakfast.    . Magnesium 400 MG CAPS Take 400 mg by mouth daily.    . meloxicam (MOBIC) 15 MG tablet Take 15 mg by mouth daily.    . metoprolol (LOPRESSOR) 100 MG tablet Take 200 mg by mouth 2 (two) times daily.     . Multiple Vitamins tablet Take 2 tablets by mouth daily.     Marland Kitchen omeprazole (PRILOSEC) 20 MG capsule Take 20 mg by mouth daily.     Marland Kitchen oxybutynin (DITROPAN) 5 MG tablet Take 5 mg by mouth 2 (two) times daily.    . potassium chloride (K-DUR,KLOR-CON) 10 MEQ tablet Take 10 mEq by mouth 3 (three) times daily.     . propranolol (INDERAL) 10 MG tablet Take 10 mg by mouth 2 (two) times daily.    . vitamin B-12 (CYANOCOBALAMIN) 500 MCG tablet Take 500 mcg by mouth daily.    Marland Kitchen warfarin (COUMADIN) 6 MG tablet Take 6 mg by mouth at bedtime.     No current facility-administered medications on file prior  to visit.    There are no Patient Instructions on file for this visit. No follow-ups on file.   Yukiko Minnich E Ashonti Leandro, NP   

## 2020-02-13 ENCOUNTER — Telehealth (INDEPENDENT_AMBULATORY_CARE_PROVIDER_SITE_OTHER): Payer: Self-pay | Admitting: Vascular Surgery

## 2020-02-13 NOTE — Telephone Encounter (Signed)
I returned the pts call to get further information and she says that both of her legs hurt it started about two days ago its a cramping pain that is constant. The pt has swelling below both knee's  she has no hx  of blood clots a the legs are no longer feverish they were this AM. The pt has no wounds an the left leg is darker than the right . The pt says she has numbness in her toe, she says it feels like sh has on sock all of the time

## 2020-02-14 NOTE — Telephone Encounter (Signed)
She can come with ABIs but if she's in severe pain and can't wait to be seen in our office, she can either call her pcp to be evaluated or go to urgent care.  Otherwise she can use tylenol or ibuprofen for pain

## 2020-02-14 NOTE — Telephone Encounter (Deleted)
Can you  please schedule the pt for the U/S Mentioned below.

## 2020-02-14 NOTE — Telephone Encounter (Signed)
Can you please schedule the U/S mentioned below.

## 2020-03-05 ENCOUNTER — Ambulatory Visit (INDEPENDENT_AMBULATORY_CARE_PROVIDER_SITE_OTHER): Payer: Medicare HMO | Admitting: Nurse Practitioner

## 2020-03-05 ENCOUNTER — Encounter (INDEPENDENT_AMBULATORY_CARE_PROVIDER_SITE_OTHER): Payer: Self-pay | Admitting: Nurse Practitioner

## 2020-03-05 ENCOUNTER — Ambulatory Visit (INDEPENDENT_AMBULATORY_CARE_PROVIDER_SITE_OTHER): Payer: Medicare HMO

## 2020-03-05 ENCOUNTER — Other Ambulatory Visit (INDEPENDENT_AMBULATORY_CARE_PROVIDER_SITE_OTHER): Payer: Self-pay | Admitting: Nurse Practitioner

## 2020-03-05 ENCOUNTER — Other Ambulatory Visit: Payer: Self-pay

## 2020-03-05 VITALS — BP 151/90 | HR 68 | Resp 16 | Wt 287.6 lb

## 2020-03-05 DIAGNOSIS — I10 Essential (primary) hypertension: Secondary | ICD-10-CM | POA: Diagnosis not present

## 2020-03-05 DIAGNOSIS — I890 Lymphedema, not elsewhere classified: Secondary | ICD-10-CM | POA: Diagnosis not present

## 2020-03-05 DIAGNOSIS — M1990 Unspecified osteoarthritis, unspecified site: Secondary | ICD-10-CM

## 2020-03-05 DIAGNOSIS — M79605 Pain in left leg: Secondary | ICD-10-CM | POA: Diagnosis not present

## 2020-03-05 DIAGNOSIS — M79604 Pain in right leg: Secondary | ICD-10-CM

## 2020-03-05 MED ORDER — TRAMADOL HCL 50 MG PO TABS: 50.0000 mg | ORAL_TABLET | Freq: Four times a day (QID) | ORAL | 0 refills | Status: DC | PRN

## 2020-03-05 NOTE — Progress Notes (Signed)
Subjective:    Patient ID: Suzanne Dixon, female    DOB: 1950-12-14, 69 y.o.   MRN: [id number] Chief Complaint  Patient presents with   Follow-up    ultrasound follow up    The patient presents today with complaints of pain in her left lower extremity.  She states that the pain started around the beginning of this month.  She notes that the pain started in her knee with swelling and that it radiates around that area down to her calf as well as up her leg.  She notes that the pain is worse when she has been going from a sitting to moving position.  However, once the patient begins moving the pain decreases somewhat.  She has taken Tylenol with some minor improvement.  She notes that the right lower extremity has recently begun hurting as well.  The patient notes that her meloxicam was stopped per her PCP, and the pain began approximately 2 to 3 days later.  The patient reports tenderness with palpation in the calf however it is soft, supple, with no erythema or excessive warmth.  She denies any chest pain or shortness of breath.  Today noninvasive studies show the patient has an ABI of 1.09 on the right and 1.22 on the left.  Patient has strong triphasic wave forms in the bilateral tibial arteries with a toe pressure of 104 on the right and 137 on the left.  Toe waveforms are slightly dampened.   Review of Systems  Musculoskeletal: Positive for arthralgias, gait problem and joint swelling.  Neurological: Positive for weakness.  All other systems reviewed and are negative.      Objective:   Physical Exam Vitals reviewed.  Constitutional:      Appearance: She is obese.  HENT:     Head: Normocephalic.  Pulmonary:     Effort: Pulmonary effort is normal.     Breath sounds: Normal breath sounds.  Musculoskeletal:        General: Swelling and tenderness present.     Right lower leg: Edema present.     Left lower leg: Edema present.  Skin:    General: Skin is warm and dry.    Neurological:     Mental Status: She is alert.  Psychiatric:        Mood and Affect: Mood normal.        Behavior: Behavior normal.        Thought Content: Thought content normal.        Judgment: Judgment normal.     BP (!) 151/90 (BP Location: Left Arm)    Pulse 68    Resp 16    Wt 287 lb 9.6 oz (130.5 kg)    BMI 49.37 kg/m   Past Medical History:  Diagnosis Date   A-fib ([place])    B12 deficiency    Breast cancer ([place]) 03/31/2013   Right - chemo- mastectomy   Breast cancer ([place]) 1991   Rt.- radiation   Complication of anesthesia    Recent years with general anesthesia had itching following surgery   DDD (degenerative disc disease), cervical    Depression    Diverticulitis    Edema of both legs    GERD (gastroesophageal reflux disease)    Gout    H/O mastectomy 2014   per patient   H/O: cesarean section 1987   per patient report   H/O: hysterectomy 1996   per patient   Hydradenitis    Hypertension  Hypothyroidism    Neuropathy    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[place])    Ruptured cervical disc 2002   per patient    Sleep apnea    Tachycardia     Social History   Socioeconomic History   Marital status: Widowed    Spouse name: Not on file   Number of children: Not on file   Years of education: Not on file   Highest education level: Not on file  Occupational History   Not on file  Tobacco Use   Smoking status: Never Smoker   Smokeless tobacco: Never Used  Vaping Use   Vaping Use: Never assessed  Substance and Sexual Activity   Alcohol use: No    Alcohol/week: 0.0 standard drinks   Drug use: No   Sexual activity: Not on file  Other Topics Concern   Not on file  Social History Narrative   Not on file   Social Determinants of Health   Financial Resource Strain:    Difficulty of Paying Living Expenses:   Food Insecurity:    Worried About Charity fundraiser in the Last Year:    Arboriculturist in the Last Year:    Transportation Needs:    Film/video editor (Medical):    Lack of Transportation (Non-Medical):   Physical Activity:    Days of Exercise per Week:    Minutes of Exercise per Session:   Stress:    Feeling of Stress :   Social Connections:    Frequency of Communication with Friends and Family:    Frequency of Social Gatherings with Friends and Family:    Attends Religious Services:    Active Member of Clubs or Organizations:    Attends Music therapist:    Marital Status:   Intimate Partner Violence:    Fear of Current or Ex-Partner:    Emotionally Abused:    Physically Abused:    Sexually Abused:     Past Surgical History:  Procedure Laterality Date   ABDOMINAL HYSTERECTOMY     BACK SURGERY  2002   plate and 2 screws in neck   BREAST BIOPSY Right 1991,2014   Positive   BREAST CYST ASPIRATION Left    BREAST SURGERY Right 2014   mastectomy   CATARACT EXTRACTION W/PHACO Left 05/17/2019   Procedure: CATARACT EXTRACTION PHACO AND INTRAOCULAR LENS PLACEMENT ([facility]);  Surgeon: Eulogio Bear, MD;  Location: [facility] ORS;  Service: Ophthalmology;  Laterality: Left;  [place] 00:28 CDE 2.15 FLUID PACK LOT # [id number]   CATARACT EXTRACTION W/PHACO Right 06/14/2019   Procedure: CATARACT EXTRACTION PHACO AND INTRAOCULAR LENS PLACEMENT ([facility]) RIGHT;  Surgeon: Eulogio Bear, MD;  Location: [facility] ORS;  Service: Ophthalmology;  Laterality: Right;  [place] 00:35.0 CDE 2.37 Fluid Pack Lot #[id number]   CESAREAN SECTION     CHOLECYSTECTOMY  04/09/2012   COLONOSCOPY WITH PROPOFOL N/A 06/22/2015   Procedure: COLONOSCOPY WITH PROPOFOL;  Surgeon: Manya Silvas, MD;  [facility] END[facility]COPY;  Service: Endoscopy;  Laterality: N/A;   ESOPHAGOGASTRODUODENOSCOPY     LAPAROSCOPIC GASTRIC BYPASS     MASTECTOMY Right    NEUROPLASTY / TRANSPOSITION ULNAR NERVE AT ELBOW     PORT-A-CATH REMOVAL Left 05/13/2015   Procedure: REMOVAL PORT-A-CATH LEFT CHEST ;  Surgeon:  Florene Glen, MD;  Location: [facility] ORS;  Service: General;  Laterality: Left;   REDUCTION MAMMAPLASTY Left    ROUX-EN-Y GASTRIC BYPASS  01/26/2009   SHOULDER ARTHROSCOPY WITH OPEN ROTATOR CUFF  REPAIR Right 01/03/2017   Procedure: SHOULDER ARTHROSCOPY WITH OPEN ROTATOR CUFF REPAIR;  Surgeon: Corky Mull, MD;  Location: [facility] ORS;  Service: Orthopedics;  Laterality: Right;   SHOULDER ARTHROSCOPY WITH SUBACROMIAL DECOMPRESSION, ROTATOR CUFF REPAIR AND BICEP TENDON REPAIR Right 01/03/2017   Procedure: SHOULDER ARTHROSCOPY WITH SUBACROMIAL DECOMPRESSION, ROTATOR CUFF REPAIR AND BICEP TENDON REPAIR;  Surgeon: Corky Mull, MD;  Location: [facility] ORS;  Service: Orthopedics;  Laterality: Right;  Limited debridement    Family History  Problem Relation Age of Onset   Hypertension Mother    Lymphoma Mother    Cancer Sister        Breast   Breast cancer Sister 59    Allergies  Allergen Reactions   Advair Diskus [Fluticasone-Salmeterol] Other (See Comments)    Joint pain    Alendronate     Muscle pain   Singulair [Montelukast] Other (See Comments)    "muscle pain"   Sulfa Antibiotics Hives   Venlafaxine Other (See Comments)    "altered mental status/tremors"       Assessment & Plan:   1. Arthritis Based on the patient's description of symptoms as well as the timing, I suspect that the pain is related to arthritic changes.  The pain began shortly after the patient's meloxicam was stopped.  DVT study done in the emergency room was negative in addition to normal arterial studies today.  Today have given the patient some pain medication to help provide her with some relief until she is able to see her primary care physician in a week.  Have also instructed her to utilize some diclofenac gel over the knee area, as well as lidocaine patches.  Patient will follow up with her PCP for work-up and management. - traMADol (ULTRAM) 50 MG tablet; Take 1 tablet (50 mg total) by mouth every 6  (six) hours as needed.  Dispense: 20 tablet; Refill: 0  2. Essential hypertension Continue antihypertensive medications as already ordered, these medications have been reviewed and there are no changes at this time.   3. Lymphedema Patient will continue with conservative therapies of utilizing medical grade 1 compression and elevation as previously discussed at her previous office visit.  The patient will still continue with her 70-month follow-up as planned.     Current Outpatient Medications on File Prior to Visit  Medication Sig Dispense Refill   acetaminophen (TYLENOL) 500 MG tablet Take 500 mg by mouth every 6 (six) hours as needed for moderate pain or headache.     allopurinol (ZYLOPRIM) 300 MG tablet Take 300 mg by mouth daily.      azelastine (ASTELIN) 0.1 % nasal spray Place 1 spray into both nostrils 2 (two) times daily as needed for rhinitis or allergies.      Calcium Carb-Cholecalciferol (SM CALCIUM-VITAMIN D) 600-400 MG-UNIT TABS Take 1 tablet by mouth 2 (two) times daily.      DULoxetine (CYMBALTA) 60 MG capsule Take 60 mg by mouth 2 (two) times daily.     fluticasone (FLONASE) 50 MCG/ACT nasal spray Place 2 sprays into both nostrils daily as needed for allergies.      furosemide (LASIX) 20 MG tablet Take by mouth.     gabapentin (NEURONTIN) 300 MG capsule Take 300 mg by mouth 3 (three) times daily. 300 mg and a 600 mg to equal 900 mg three times daily     gabapentin (NEURONTIN) 600 MG tablet Take 600 mg by mouth in the morning, at noon, in the evening, and at  bedtime. Takes a 300 mg and 600 mg to equal 900 mg three times daily  11   levothyroxine (SYNTHROID, LEVOTHROID) 50 MCG tablet Take 50 mcg by mouth daily before breakfast.     Magnesium 400 MG CAPS Take 400 mg by mouth daily.     metoprolol (LOPRESSOR) 100 MG tablet Take 200 mg by mouth 2 (two) times daily.      Multiple Vitamins tablet Take 2 tablets by mouth daily.      omeprazole (PRILOSEC) 20 MG  capsule Take 20 mg by mouth daily.      oxybutynin (DITROPAN) 5 MG tablet Take 5 mg by mouth 2 (two) times daily.     potassium chloride (K-DUR,KLOR-CON) 10 MEQ tablet Take 10 mEq by mouth 3 (three) times daily.      propranolol (INDERAL) 10 MG tablet Take 10 mg by mouth 2 (two) times daily.     vitamin B-12 (CYANOCOBALAMIN) 500 MCG tablet Take 500 mcg by mouth daily.     warfarin (COUMADIN) 6 MG tablet Take 6 mg by mouth at bedtime.     meloxicam (MOBIC) 15 MG tablet Take 15 mg by mouth daily. (Patient not taking: Reported on 03/05/2020)     No current facility-administered medications on file prior to visit.    There are no Patient Instructions on file for this visit. No follow-ups on file.   Kris Hartmann, NP

## 2020-04-07 DIAGNOSIS — M12811 Other specific arthropathies, not elsewhere classified, right shoulder: Secondary | ICD-10-CM | POA: Insufficient documentation

## 2020-04-30 ENCOUNTER — Other Ambulatory Visit: Payer: Self-pay | Admitting: Internal Medicine

## 2020-04-30 DIAGNOSIS — Z1231 Encounter for screening mammogram for malignant neoplasm of breast: Secondary | ICD-10-CM

## 2020-05-05 ENCOUNTER — Other Ambulatory Visit: Payer: Self-pay

## 2020-05-05 ENCOUNTER — Ambulatory Visit
Admission: RE | Admit: 2020-05-05 | Discharge: 2020-05-05 | Disposition: A | Discharge status: Home / Self Care | Payer: Medicare HMO | Source: Ambulatory Visit | Attending: Internal Medicine | Admitting: Internal Medicine

## 2020-05-05 DIAGNOSIS — Z1231 Encounter for screening mammogram for malignant neoplasm of breast: Secondary | ICD-10-CM | POA: Diagnosis not present

## 2020-05-05 HISTORY — DX: Personal history of antineoplastic chemotherapy: Z92.21

## 2020-05-05 HISTORY — DX: Personal history of irradiation: Z92.3

## 2020-07-14 ENCOUNTER — Other Ambulatory Visit: Payer: Self-pay | Admitting: Student

## 2020-07-14 ENCOUNTER — Other Ambulatory Visit (HOSPITAL_COMMUNITY): Payer: Self-pay | Admitting: Student

## 2020-07-14 DIAGNOSIS — M19011 Primary osteoarthritis, right shoulder: Secondary | ICD-10-CM

## 2020-07-14 DIAGNOSIS — M75101 Unspecified rotator cuff tear or rupture of right shoulder, not specified as traumatic: Secondary | ICD-10-CM

## 2020-07-17 ENCOUNTER — Encounter (INDEPENDENT_AMBULATORY_CARE_PROVIDER_SITE_OTHER): Payer: Self-pay | Admitting: Vascular Surgery

## 2020-07-17 ENCOUNTER — Other Ambulatory Visit: Payer: Self-pay

## 2020-07-17 ENCOUNTER — Ambulatory Visit (INDEPENDENT_AMBULATORY_CARE_PROVIDER_SITE_OTHER): Payer: Medicare HMO | Admitting: Vascular Surgery

## 2020-07-17 VITALS — BP 175/89 | HR 66 | Ht 64.0 in | Wt 283.0 lb

## 2020-07-17 DIAGNOSIS — I4891 Unspecified atrial fibrillation: Secondary | ICD-10-CM | POA: Diagnosis not present

## 2020-07-17 DIAGNOSIS — I890 Lymphedema, not elsewhere classified: Secondary | ICD-10-CM | POA: Diagnosis not present

## 2020-07-17 DIAGNOSIS — I10 Essential (primary) hypertension: Secondary | ICD-10-CM

## 2020-07-17 NOTE — Progress Notes (Signed)
MRN : [id number]  Suzanne Dixon is a 69 y.o. (06/22/1951) female who presents with chief complaint of  Chief Complaint  Patient presents with  . Follow-up    59mo no studies  .  History of Present Illness: Patient returns today in follow up of her leg pain and swelling.  She says her legs are no better.  She has significant neuropathic and arthritic issues, but her swelling has been persistent as well.  She has not gotten the lymphedema pump we had requested previously as it sounds like it was not approved.  She clearly has stage II lymphedema refractory to compression and elevation with ongoing swelling.  Current Outpatient Medications  Medication Sig Dispense Refill  . acetaminophen (TYLENOL) 500 MG tablet Take 500 mg by mouth every 6 (six) hours as needed for moderate pain or headache.    . allopurinol (ZYLOPRIM) 300 MG tablet Take 300 mg by mouth daily.     Marland Kitchen azelastine (ASTELIN) 0.1 % nasal spray Place 1 spray into both nostrils 2 (two) times daily as needed for rhinitis or allergies.     . Calcium Carb-Cholecalciferol (SM CALCIUM-VITAMIN D) 600-400 MG-UNIT TABS Take 1 tablet by mouth 2 (two) times daily.     . carbidopa-levodopa (SINEMET IR) 25-100 MG tablet Take by mouth.    . DULoxetine (CYMBALTA) 60 MG capsule Take 60 mg by mouth 2 (two) times daily.    . fluticasone (FLONASE) 50 MCG/ACT nasal spray Place 2 sprays into both nostrils daily as needed for allergies.     . furosemide (LASIX) 20 MG tablet Take by mouth.    . gabapentin (NEURONTIN) 300 MG capsule Take 300 mg by mouth 3 (three) times daily. 300 mg and a 600 mg to equal 900 mg three times daily    . gabapentin (NEURONTIN) 600 MG tablet Take 600 mg by mouth in the morning, at noon, in the evening, and at bedtime. Takes a 300 mg and 600 mg to equal 900 mg three times daily  11  . levothyroxine (SYNTHROID, LEVOTHROID) 50 MCG tablet Take 50 mcg by mouth daily before breakfast.    . Magnesium 400 MG CAPS Take 400 mg by mouth  daily.    . meloxicam (MOBIC) 15 MG tablet Take 15 mg by mouth daily. (Patient not taking: Reported on 03/05/2020)    . metoprolol (LOPRESSOR) 100 MG tablet Take 200 mg by mouth 2 (two) times daily.     . Multiple Vitamins tablet Take 2 tablets by mouth daily.     Marland Kitchen omeprazole (PRILOSEC) 20 MG capsule Take 20 mg by mouth daily.     Marland Kitchen oxybutynin (DITROPAN) 5 MG tablet Take 5 mg by mouth 2 (two) times daily.    . potassium chloride (K-DUR,KLOR-CON) 10 MEQ tablet Take 10 mEq by mouth 3 (three) times daily.     . propranolol (INDERAL) 10 MG tablet Take 10 mg by mouth 2 (two) times daily. (Patient not taking: Reported on 07/17/2020)    . traMADol (ULTRAM) 50 MG tablet Take 1 tablet (50 mg total) by mouth every 6 (six) hours as needed. 20 tablet 0  . vitamin B-12 (CYANOCOBALAMIN) 500 MCG tablet Take 500 mcg by mouth daily.    Marland Kitchen warfarin (COUMADIN) 6 MG tablet Take 6 mg by mouth at bedtime.     No current facility-administered medications for this visit.    Past Medical History:  Diagnosis Date  . A-fib ([place])   . B12 deficiency   .  Breast cancer ([place]) 03/31/2013   Right - chemo- mastectomy  . Breast cancer ([place] [facility]) 1991   Rt.- radiation  . Complication of anesthesia    Recent years with general anesthesia had itching following surgery  . DDD (degenerative disc disease), cervical   . Depression   . Diverticulitis   . Edema of both legs   . GERD (gastroesophageal reflux disease)   . Gout   . H/O mastectomy 2014   per patient  . H/O: cesarean section 1987   per patient report  . H/O: hysterectomy 1996   per patient  . Hydradenitis   . Hypertension   . Hypothyroidism   . Neuropathy   . 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 Personal history of chemotherapy   . Personal history of radiation therapy   . Ruptured cervical disc 2002   per patient   . Sleep apnea   . Tachycardia     Past Surgical History:  Procedure Laterality Date  . ABDOMINAL HYSTERECTOMY    . BACK SURGERY  2002   plate  and 2 screws in neck  . BREAST BIOPSY Right 1991,2014   Positive  . BREAST CYST ASPIRATION Left   . BREAST SURGERY Right 2014   mastectomy  . CATARACT EXTRACTION W/PHACO Left 05/17/2019   Procedure: CATARACT EXTRACTION PHACO AND INTRAOCULAR LENS PLACEMENT ([facility]);  Surgeon: Eulogio Bear, MD;  Location: [facility] ORS;  Service: Ophthalmology;  Laterality: Left;  [place] 00:28 CDE 2.15 FLUID PACK LOT # [id number]  . CATARACT EXTRACTION W/PHACO Right 06/14/2019   Procedure: CATARACT EXTRACTION PHACO AND INTRAOCULAR LENS PLACEMENT ([place]) RIGHT;  Surgeon: Eulogio Bear, MD;  Location: [facility] ORS;  Service: Ophthalmology;  Laterality: Right;  [place] 00:35.0 CDE 2.37 Fluid Pack Lot #[id number] H  . CESAREAN SECTION    . CHOLECYSTECTOMY  04/09/2012  . COLONOSCOPY WITH PROPOFOL N/A 06/22/2015   Procedure: COLONOSCOPY WITH PROPOFOL;  Surgeon: Manya Silvas, MD;  Location: [facility] ENDOSCOPY;  Service: Endoscopy;  Laterality: N/A;  . ESOPHAGOGASTRODUODENOSCOPY    . LAPAROSCOPIC GASTRIC BYPASS    . MASTECTOMY Right   . NEUROPLASTY / TRANSPOSITION ULNAR NERVE AT ELBOW    . PORT-A-CATH REMOVAL Left 05/13/2015   Procedure: REMOVAL PORT-A-CATH LEFT CHEST ;  Surgeon: Florene Glen, MD;  Location: [facility] ORS;  Service: General;  Laterality: Left;  . REDUCTION MAMMAPLASTY Left   . ROUX-EN-Y GASTRIC BYPASS  01/26/2009  . SHOULDER ARTHROSCOPY WITH OPEN ROTATOR CUFF REPAIR Right 01/03/2017   Procedure: SHOULDER ARTHROSCOPY WITH OPEN ROTATOR CUFF REPAIR;  Surgeon: Corky Mull, MD;  Location: [facility] ORS;  Service: Orthopedics;  Laterality: Right;  . SHOULDER ARTHROSCOPY WITH SUBACROMIAL DECOMPRESSION, ROTATOR CUFF REPAIR AND BICEP TENDON REPAIR Right 01/03/2017   Procedure: SHOULDER ARTHROSCOPY WITH SUBACROMIAL DECOMPRESSION, ROTATOR CUFF REPAIR AND BICEP TENDON REPAIR;  Surgeon: Corky Mull, MD;  Location: [facility] ORS;  Service: Orthopedics;  Laterality: Right;  Limited debridement     Social History   Tobacco Use  . Smoking  status: Never Smoker  . Smokeless tobacco: Never Used  Vaping Use  . Vaping Use: Never assessed  Substance Use Topics  . Alcohol use: No    Alcohol/week: 0.0 standard drinks  . Drug use: No       Family History  Problem Relation Age of Onset  . Hypertension Mother   . Lymphoma Mother   . Cancer Sister        Breast  . Breast cancer Sister 77  Allergies  Allergen Reactions  . Advair Diskus [Fluticasone-Salmeterol] Other (See Comments)    Joint pain   . Alendronate     Muscle pain  . Singulair [Montelukast] Other (See Comments)    "muscle pain"  . Sulfa Antibiotics Hives  . Venlafaxine Other (See Comments)    "altered mental status/tremors"     REVIEW OF SYSTEMS (Negative unless checked)  Constitutional: [] Weight loss  [] Fever  [] Chills Cardiac: [] Chest pain   [] Chest pressure   [] Palpitations   [] Shortness of breath when laying flat   [] Shortness of breath at rest   [] Shortness of breath with exertion. Vascular:  [] Pain in legs with walking   [] Pain in legs at rest   [] Pain in legs when laying flat   [] Claudication   [] Pain in feet when walking  [] Pain in feet at rest  [] Pain in feet when laying flat   [] History of DVT   [] Phlebitis   [x] Swelling in legs   [] Varicose veins   [] Non-healing ulcers Pulmonary:   [] Uses home oxygen   [] Productive cough   [] Hemoptysis   [] Wheeze  [] COPD   [] Asthma Neurologic:  [] Dizziness  [] Blackouts   [] Seizures   [] History of stroke   [] History of TIA  [] Aphasia   [] Temporary blindness   [] Dysphagia   [] Weakness or numbness in arms   [x] Weakness or numbness in legs Musculoskeletal:  [x] Arthritis   [] Joint swelling   [x] Joint pain   [] Low back pain Hematologic:  [] Easy bruising  [] Easy bleeding   [] Hypercoagulable state   [] Anemic   Gastrointestinal:  [] Blood in stool   [] Vomiting blood  [] Gastroesophageal reflux/heartburn   [] Abdominal pain Genitourinary:  [] Chronic kidney disease   [] Difficult urination  [] Frequent urination  [] Burning  with urination   [] Hematuria Skin:  [] Rashes   [] Ulcers   [] Wounds Psychological:  [] History of anxiety   []  History of major depression.  Physical Examination  BP (!) 175/89   Pulse 66   Ht 5\' 4"  (1.626 m)   Wt 283 lb (128.4 kg)   BMI 48.58 kg/m  Gen:  WD/WN, NAD Head: [place]/AT, No temporalis wasting. Ear/Nose/Throat: Hearing grossly intact, nares w/o erythema or drainage Eyes: Conjunctiva clear. Sclera non-icteric Neck: Supple.  Trachea midline Pulmonary:  Good air movement, no use of accessory muscles.  Cardiac: RRR, no JVD Vascular:  Vessel Right Left  Radial Palpable Palpable           Musculoskeletal: M/S 5/5 throughout.  No deformity or atrophy. 1+ BLE edema. Neurologic: Sensation grossly intact in extremities.  Symmetrical.  Speech is fluent.  Psychiatric: Judgment intact, Mood & affect appropriate for pt's clinical situation. Dermatologic: No rashes or ulcers noted.  No cellulitis or open wounds.       Labs No results found for this or any previous visit (from the past 2160 hour(s)).  Radiology No results found.  Assessment/Plan BP (high blood pressure) blood pressure control important in reducing the progression of atherosclerotic disease. On appropriate oral medications.   A-fib On anticoagulation  Lymphedema Patient has persistent swelling despite compression and elevation.  Lymphedema pump would be an excellent adjuvant therapy to try to improve her pain and swelling in her lower legs.  We will again try to get this approved and add this to her regimen.  We will see her back in 6 months.    Leotis Pain, MD  07/17/2020 12:14 PM    This note was created with Dragon medical transcription system.  Any errors from dictation are purely  unintentional

## 2020-07-17 NOTE — Assessment & Plan Note (Signed)
Patient has persistent swelling despite compression and elevation.  Lymphedema pump would be an excellent adjuvant therapy to try to improve her pain and swelling in her lower legs.  We will again try to get this approved and add this to her regimen.  We will see her back in 6 months.

## 2020-07-22 ENCOUNTER — Other Ambulatory Visit: Payer: Self-pay

## 2020-07-22 ENCOUNTER — Ambulatory Visit
Admission: RE | Admit: 2020-07-22 | Discharge: 2020-07-22 | Disposition: A | Discharge status: Home / Self Care | Payer: Medicare HMO | Source: Ambulatory Visit | Attending: Student | Admitting: Student

## 2020-07-22 DIAGNOSIS — M75101 Unspecified rotator cuff tear or rupture of right shoulder, not specified as traumatic: Secondary | ICD-10-CM | POA: Diagnosis present

## 2020-07-22 DIAGNOSIS — M12811 Other specific arthropathies, not elsewhere classified, right shoulder: Secondary | ICD-10-CM | POA: Diagnosis present

## 2020-07-22 DIAGNOSIS — M19011 Primary osteoarthritis, right shoulder: Secondary | ICD-10-CM | POA: Diagnosis present

## 2020-09-30 ENCOUNTER — Other Ambulatory Visit: Payer: Self-pay | Admitting: Surgery

## 2020-10-06 ENCOUNTER — Inpatient Hospital Stay: Admission: RE | Admit: 2020-10-06 | Payer: Medicare HMO | Source: Ambulatory Visit

## 2020-10-12 ENCOUNTER — Other Ambulatory Visit: Admission: RE | Admit: 2020-10-12 | Payer: Medicare HMO | Source: Ambulatory Visit

## 2020-10-13 ENCOUNTER — Inpatient Hospital Stay: Admit: 2020-10-13 | Payer: Medicare HMO | Admitting: Surgery

## 2020-10-13 SURGERY — ARTHROPLASTY, SHOULDER, TOTAL, REVERSE
Anesthesia: Choice | Site: Shoulder | Laterality: Right

## 2020-11-26 ENCOUNTER — Other Ambulatory Visit: Payer: Self-pay

## 2020-11-26 ENCOUNTER — Encounter
Admission: RE | Admit: 2020-11-26 | Discharge: 2020-11-26 | Disposition: A | Discharge status: Home / Self Care | Payer: Medicare HMO | Source: Ambulatory Visit | Attending: Surgery | Admitting: Surgery

## 2020-11-26 DIAGNOSIS — Z01818 Encounter for other preprocedural examination: Secondary | ICD-10-CM | POA: Diagnosis present

## 2020-11-26 HISTORY — DX: Anemia, unspecified: D64.9

## 2020-11-26 HISTORY — DX: Dyspnea, unspecified: R06.00

## 2020-11-26 HISTORY — DX: Parkinson's disease without dyskinesia, without mention of fluctuations: G20.A1

## 2020-11-26 HISTORY — DX: Parkinson's disease: G20

## 2020-11-26 LAB — URINALYSIS, ROUTINE W REFLEX MICROSCOPIC
Bilirubin Urine: NEGATIVE
Glucose, UA: NEGATIVE mg/dL
Ketones, ur: 5 mg/dL — AB
Nitrite: NEGATIVE
Protein, ur: NEGATIVE mg/dL
Specific Gravity, Urine: 1.021 (ref 1.005–1.030)
WBC, UA: >50 WBC/hpf — ABNORMAL HIGH (ref 0–5)
pH: 5 (ref 5.0–8.0)

## 2020-11-26 LAB — COMPREHENSIVE METABOLIC PANEL
ALT: <5 U/L (ref 0–44)
AST: 27 U/L (ref 15–41)
Albumin: 3.7 g/dL (ref 3.5–5.0)
Alkaline Phosphatase: 74 U/L (ref 38–126)
Anion gap: 9 (ref 5–15)
BUN: 13 mg/dL (ref 8–23)
CO2: 24 mmol/L (ref 22–32)
Calcium: 9 mg/dL (ref 8.9–10.3)
Chloride: 106 mmol/L (ref 98–111)
Creatinine, Ser: 0.72 mg/dL (ref 0.44–1.00)
GFR, Estimated: >60 mL/min (ref >60)
Glucose, Bld: 99 mg/dL (ref 70–99)
Potassium: 4.5 mmol/L (ref 3.5–5.1)
Sodium: 139 mmol/L (ref 135–145)
Total Bilirubin: 0.6 mg/dL (ref 0.3–1.2)
Total Protein: 7.4 g/dL (ref 6.5–8.1)

## 2020-11-26 LAB — TYPE AND SCREEN
ABO/RH(D): B POS
Antibody Screen: NEGATIVE

## 2020-11-26 LAB — CBC WITH DIFFERENTIAL/PLATELET
Abs Immature Granulocytes: 0.02 10*3/uL (ref 0.00–0.07)
Basophils Absolute: 0 10*3/uL (ref 0.0–0.1)
Basophils Relative: 1 %
Eosinophils Absolute: 0.1 10*3/uL (ref 0.0–0.5)
Eosinophils Relative: 2 %
HCT: 40 % (ref 36.0–46.0)
Hemoglobin: 13.7 g/dL (ref 12.0–15.0)
Immature Granulocytes: 0 %
Lymphocytes Relative: 50 %
Lymphs Abs: 3.1 10*3/uL (ref 0.7–4.0)
MCH: 31.1 pg (ref 26.0–34.0)
MCHC: 34.3 g/dL (ref 30.0–36.0)
MCV: 90.9 fL (ref 80.0–100.0)
Monocytes Absolute: 0.6 10*3/uL (ref 0.1–1.0)
Monocytes Relative: 10 %
Neutro Abs: 2.3 10*3/uL (ref 1.7–7.7)
Neutrophils Relative %: 37 %
Platelets: 302 10*3/uL (ref 150–400)
RBC: 4.4 MIL/uL (ref 3.87–5.11)
RDW: 13.1 % (ref 11.5–15.5)
WBC: 6.2 10*3/uL (ref 4.0–10.5)
nRBC: 0 % (ref 0.0–0.2)

## 2020-11-26 LAB — SURGICAL PCR SCREEN
MRSA, PCR: NEGATIVE
Staphylococcus aureus: NEGATIVE

## 2020-11-26 LAB — PROTIME-INR
INR: 2.3 — ABNORMAL HIGH (ref 0.8–1.2)
Prothrombin Time: 24.8 seconds — ABNORMAL HIGH (ref 11.4–15.2)

## 2020-11-26 LAB — APTT: aPTT: 37 seconds — ABNORMAL HIGH (ref 24–36)

## 2020-11-26 NOTE — Patient Instructions (Signed)
Your procedure is scheduled on: Thursday December 03, 2020. Report to Day Surgery inside [facility] 2nd floor (stop by Admissions desk first before getting on Elevator). To find out your arrival time please call [phone] between 1PM - 3PM on Wednesday December 02, 2020.  Remember: Instructions that are not followed completely may result in serious medical risk,  up to and including death, or upon the discretion of your surgeon and anesthesiologist your  surgery may need to be rescheduled.     _X__ 1. Do not eat food after midnight the night before your procedure.                 No chewing gum or hard candies. You may drink clear liquids up to 2 hours                 before you are scheduled to arrive for your surgery- DO not drink clear                 liquids within 2 hours of the start of your surgery.                 Clear Liquids include:  water, apple juice without pulp, clear Gatorade, G2 or                  Gatorade Zero (avoid Red/Purple/Blue), Black Coffee or Tea (Do not add                 anything to coffee or tea).  __X__2.   Complete the "Ensure Clear Pre-surgery Clear Carbohydrate Drink" provided to you, 2 hours before arrival. **If you are diabetic you will be provided with an alternative drink, Gatorade Zero or G2.  __X__3.  On the morning of surgery brush your teeth with toothpaste and water, you                may rinse your mouth with mouthwash if you wish.  Do not swallow any toothpaste of mouthwash.     _X__ 4.  No Alcohol for 24 hours before or after surgery.   _X__ 5.  Do Not Smoke or use e-cigarettes For 24 Hours Prior to Your Surgery.                 Do not use any chewable tobacco products for at least 6 hours prior to                 Surgery.  _X__  6.  Do not use any recreational drugs (marijuana, cocaine, heroin, ecstasy, MDMA or other)                For at least one week prior to your surgery.  Combination of these drugs with  anesthesia                May have life threatening results.  __X__  7.  Notify your doctor if there is any change in your medical condition      (cold, fever, infections).     Do not wear jewelry, make-up, hairpins, clips or nail polish. Do not wear lotions, powders, or perfumes. You may wear deodorant. Do not shave 48 hours prior to surgery. Men may shave face and neck. Do not bring valuables to the hospital.    [facility] is not responsible for any belongings or valuables.  Contacts, dentures or bridgework may not be worn into surgery. Leave your suitcase in the car. After  surgery it may be brought to your room. For patients admitted to the hospital, discharge time is determined by your treatment team.   Patients discharged the day of surgery will not be allowed to drive home.   Make arrangements for someone to be with you for the first 24 hours of your Same Day Discharge.    Please read over the following fact sheets that you were given:   Total Joint Packet    __X__ Take these medicines the morning of surgery with A SIP OF WATER:    1. allopurinol (ZYLOPRIM) 300 MG  2. carbidopa-levodopa (SINEMET IR) 25-100 MG  3. DULoxetine (CYMBALTA) 60 MG  4. gabapentin (NEURONTIN) 600 MG  5. levothyroxine (SYNTHROID, LEVOTHROID) 50 MCG  6. metoprolol (LOPRESSOR) 100 MG  7. omeprazole (PRILOSEC) 20 MG  8. oxybutynin (DITROPAN) 5 MG    ____ Fleet Enema (as directed)   __X__ Use CHG Soap (or wipes) as directed  ____ Use Benzoyl Peroxide Gel as instructed  ____ Use inhalers on the day of surgery  ____ Stop metformin 2 days prior to surgery    ____ Take 1/2 of usual insulin dose the night before surgery. No insulin the morning          of surgery.   __X__ Stop Anti-inflammatories such as meloxicam (MOBIC), Ibuprofen, Aleve, Advil, naproxen, aspirin and or BC powders.   __X__ Stop All supplements until after surgery.    __X__ Do not start any herbal supplements before your  procedure.   If you have any questions regarding your pre-procedure instructions,  Please call Pre-admit Testing at [phone].

## 2020-12-01 ENCOUNTER — Other Ambulatory Visit
Admission: RE | Admit: 2020-12-01 | Discharge: 2020-12-01 | Disposition: A | Discharge status: Home / Self Care | Payer: Medicare HMO | Source: Ambulatory Visit | Attending: Surgery | Admitting: Surgery

## 2020-12-01 ENCOUNTER — Other Ambulatory Visit: Payer: Self-pay

## 2020-12-01 DIAGNOSIS — Z01812 Encounter for preprocedural laboratory examination: Secondary | ICD-10-CM | POA: Insufficient documentation

## 2020-12-01 DIAGNOSIS — Z20822 Contact with and (suspected) exposure to covid-19: Secondary | ICD-10-CM | POA: Insufficient documentation

## 2020-12-01 LAB — SARS CORONAVIRUS 2 (TAT 6-24 HRS): SARS Coronavirus 2: NEGATIVE

## 2020-12-02 MED ORDER — ORAL CARE MOUTH RINSE: 15.0000 mL | Freq: Once | OROMUCOSAL | Status: AC

## 2020-12-02 MED ORDER — DEXTROSE 5 % IV SOLN
3.0000 g | INTRAVENOUS | Status: AC
  Administered 2020-12-03: 3 g via INTRAVENOUS
  Filled 2020-12-02: qty 3

## 2020-12-02 MED ORDER — LACTATED RINGERS IV SOLN: INTRAVENOUS | Status: DC

## 2020-12-02 MED ORDER — CHLORHEXIDINE GLUCONATE 0.12 % MT SOLN: 15.0000 mL | Freq: Once | OROMUCOSAL | Status: AC

## 2020-12-03 ENCOUNTER — Inpatient Hospital Stay: Payer: Medicare HMO | Admitting: Anesthesiology

## 2020-12-03 ENCOUNTER — Inpatient Hospital Stay: Payer: Medicare HMO

## 2020-12-03 ENCOUNTER — Encounter: Payer: Self-pay | Admitting: Surgery

## 2020-12-03 ENCOUNTER — Inpatient Hospital Stay
Admission: RE | Admit: 2020-12-03 | Discharge: 2020-12-04 | DRG: 483 | Disposition: A | Discharge status: Home / Self Care | Payer: Medicare HMO | Attending: Surgery | Admitting: Surgery

## 2020-12-03 ENCOUNTER — Other Ambulatory Visit: Payer: Self-pay

## 2020-12-03 ENCOUNTER — Encounter
Admission: RE | Disposition: A | Discharge status: Home / Self Care | Payer: Self-pay | Source: Home / Self Care | Attending: Surgery

## 2020-12-03 DIAGNOSIS — Z9221 Personal history of antineoplastic chemotherapy: Secondary | ICD-10-CM | POA: Diagnosis not present

## 2020-12-03 DIAGNOSIS — Z79899 Other long term (current) drug therapy: Secondary | ICD-10-CM | POA: Diagnosis not present

## 2020-12-03 DIAGNOSIS — G4730 Sleep apnea, unspecified: Secondary | ICD-10-CM | POA: Diagnosis present

## 2020-12-03 DIAGNOSIS — M7521 Bicipital tendinitis, right shoulder: Secondary | ICD-10-CM | POA: Diagnosis present

## 2020-12-03 DIAGNOSIS — Z419 Encounter for procedure for purposes other than remedying health state, unspecified: Secondary | ICD-10-CM

## 2020-12-03 DIAGNOSIS — Z825 Family history of asthma and other chronic lower respiratory diseases: Secondary | ICD-10-CM | POA: Diagnosis not present

## 2020-12-03 DIAGNOSIS — Z20822 Contact with and (suspected) exposure to covid-19: Secondary | ICD-10-CM | POA: Diagnosis present

## 2020-12-03 DIAGNOSIS — M810 Age-related osteoporosis without current pathological fracture: Secondary | ICD-10-CM | POA: Diagnosis present

## 2020-12-03 DIAGNOSIS — Z833 Family history of diabetes mellitus: Secondary | ICD-10-CM

## 2020-12-03 DIAGNOSIS — Z7901 Long term (current) use of anticoagulants: Secondary | ICD-10-CM | POA: Diagnosis not present

## 2020-12-03 DIAGNOSIS — G20 Parkinson's disease: Secondary | ICD-10-CM | POA: Diagnosis present

## 2020-12-03 DIAGNOSIS — F32A Depression, unspecified: Secondary | ICD-10-CM | POA: Diagnosis present

## 2020-12-03 DIAGNOSIS — Z923 Personal history of irradiation: Secondary | ICD-10-CM | POA: Diagnosis not present

## 2020-12-03 DIAGNOSIS — M25511 Pain in right shoulder: Secondary | ICD-10-CM | POA: Diagnosis present

## 2020-12-03 DIAGNOSIS — E079 Disorder of thyroid, unspecified: Secondary | ICD-10-CM | POA: Diagnosis present

## 2020-12-03 DIAGNOSIS — F419 Anxiety disorder, unspecified: Secondary | ICD-10-CM | POA: Diagnosis present

## 2020-12-03 DIAGNOSIS — Z96611 Presence of right artificial shoulder joint: Secondary | ICD-10-CM

## 2020-12-03 DIAGNOSIS — E559 Vitamin D deficiency, unspecified: Secondary | ICD-10-CM | POA: Diagnosis present

## 2020-12-03 DIAGNOSIS — E039 Hypothyroidism, unspecified: Secondary | ICD-10-CM | POA: Diagnosis present

## 2020-12-03 DIAGNOSIS — Z853 Personal history of malignant neoplasm of breast: Secondary | ICD-10-CM | POA: Diagnosis not present

## 2020-12-03 DIAGNOSIS — Z6841 Body Mass Index (BMI) 40.0 and over, adult: Secondary | ICD-10-CM

## 2020-12-03 DIAGNOSIS — Z79890 Hormone replacement therapy (postmenopausal): Secondary | ICD-10-CM | POA: Diagnosis not present

## 2020-12-03 DIAGNOSIS — I10 Essential (primary) hypertension: Secondary | ICD-10-CM | POA: Diagnosis present

## 2020-12-03 DIAGNOSIS — Z9884 Bariatric surgery status: Secondary | ICD-10-CM

## 2020-12-03 DIAGNOSIS — Z9011 Acquired absence of right breast and nipple: Secondary | ICD-10-CM | POA: Diagnosis not present

## 2020-12-03 DIAGNOSIS — M75121 Complete rotator cuff tear or rupture of right shoulder, not specified as traumatic: Secondary | ICD-10-CM | POA: Diagnosis present

## 2020-12-03 DIAGNOSIS — Z8249 Family history of ischemic heart disease and other diseases of the circulatory system: Secondary | ICD-10-CM

## 2020-12-03 DIAGNOSIS — K219 Gastro-esophageal reflux disease without esophagitis: Secondary | ICD-10-CM | POA: Diagnosis present

## 2020-12-03 HISTORY — PX: REVERSE SHOULDER ARTHROPLASTY: SHX5054

## 2020-12-03 LAB — PROTIME-INR
INR: 1.3 — ABNORMAL HIGH (ref 0.8–1.2)
Prothrombin Time: 15.4 seconds — ABNORMAL HIGH (ref 11.4–15.2)

## 2020-12-03 LAB — ABO/RH: ABO/RH(D): B POS

## 2020-12-03 SURGERY — ARTHROPLASTY, SHOULDER, TOTAL, REVERSE
Anesthesia: General | Site: Shoulder | Laterality: Right

## 2020-12-03 MED ORDER — FENTANYL CITRATE (PF) 100 MCG/2ML IJ SOLN
INTRAMUSCULAR | Status: AC
  Administered 2020-12-03: 50 ug via INTRAVENOUS
  Filled 2020-12-03: qty 2

## 2020-12-03 MED ORDER — PROPOFOL 10 MG/ML IV BOLUS
INTRAVENOUS | Status: DC | PRN
  Administered 2020-12-03: 150 mg via INTRAVENOUS
  Administered 2020-12-03: 30 mg via INTRAVENOUS
  Administered 2020-12-03: 20 mg via INTRAVENOUS

## 2020-12-03 MED ORDER — FENTANYL CITRATE (PF) 100 MCG/2ML IJ SOLN: 50.0000 ug | Freq: Once | INTRAMUSCULAR | Status: AC

## 2020-12-03 MED ORDER — DOCUSATE SODIUM 100 MG PO CAPS
100.0000 mg | ORAL_CAPSULE | Freq: Two times a day (BID) | ORAL | Status: DC
  Administered 2020-12-03 – 2020-12-04 (×2): 100 mg via ORAL
  Filled 2020-12-03 (×2): qty 1

## 2020-12-03 MED ORDER — KETOROLAC TROMETHAMINE 15 MG/ML IJ SOLN
7.5000 mg | Freq: Four times a day (QID) | INTRAMUSCULAR | Status: AC
  Administered 2020-12-03 – 2020-12-04 (×3): 7.5 mg via INTRAVENOUS
  Filled 2020-12-03 (×3): qty 1

## 2020-12-03 MED ORDER — BUPIVACAINE HCL (PF) 0.5 % IJ SOLN
INTRAMUSCULAR | Status: AC
  Filled 2020-12-03: qty 10

## 2020-12-03 MED ORDER — WARFARIN SODIUM 6 MG PO TABS: 6.0000 mg | ORAL_TABLET | Freq: Every day | ORAL | Status: DC

## 2020-12-03 MED ORDER — BUPIVACAINE LIPOSOME 1.3 % IJ SUSP
INTRAMUSCULAR | Status: AC
  Filled 2020-12-03: qty 20

## 2020-12-03 MED ORDER — CALCIUM CARBONATE-VITAMIN D 500-200 MG-UNIT PO TABS
1.0000 | ORAL_TABLET | Freq: Two times a day (BID) | ORAL | Status: DC
  Administered 2020-12-04: 1 via ORAL
  Filled 2020-12-03: qty 1

## 2020-12-03 MED ORDER — ONDANSETRON HCL 4 MG/2ML IJ SOLN: 4.0000 mg | Freq: Once | INTRAMUSCULAR | Status: DC | PRN

## 2020-12-03 MED ORDER — METOCLOPRAMIDE HCL 5 MG/ML IJ SOLN: 5.0000 mg | Freq: Three times a day (TID) | INTRAMUSCULAR | Status: DC | PRN

## 2020-12-03 MED ORDER — WARFARIN SODIUM 1 MG PO TABS: 1.0000 mg | ORAL_TABLET | Freq: Every day | ORAL | Status: DC

## 2020-12-03 MED ORDER — TRANEXAMIC ACID 1000 MG/10ML IV SOLN
INTRAVENOUS | Status: AC
  Filled 2020-12-03: qty 10

## 2020-12-03 MED ORDER — SUCCINYLCHOLINE CHLORIDE 200 MG/10ML IV SOSY
PREFILLED_SYRINGE | INTRAVENOUS | Status: AC
  Filled 2020-12-03: qty 10

## 2020-12-03 MED ORDER — WARFARIN - PHYSICIAN DOSING INPATIENT: Freq: Every day | Status: DC

## 2020-12-03 MED ORDER — ROCURONIUM BROMIDE 10 MG/ML (PF) SYRINGE
PREFILLED_SYRINGE | INTRAVENOUS | Status: AC
  Filled 2020-12-03: qty 10

## 2020-12-03 MED ORDER — GABAPENTIN 600 MG PO TABS
600.0000 mg | ORAL_TABLET | Freq: Four times a day (QID) | ORAL | Status: DC
  Administered 2020-12-03 – 2020-12-04 (×3): 600 mg via ORAL
  Filled 2020-12-03 (×3): qty 1

## 2020-12-03 MED ORDER — ONDANSETRON HCL 4 MG PO TABS: 4.0000 mg | ORAL_TABLET | Freq: Four times a day (QID) | ORAL | Status: DC | PRN

## 2020-12-03 MED ORDER — SODIUM CHLORIDE 0.9 % IV SOLN
INTRAVENOUS | Status: DC | PRN
  Administered 2020-12-03: 20 ug/min via INTRAVENOUS

## 2020-12-03 MED ORDER — PHENYLEPHRINE HCL (PRESSORS) 10 MG/ML IV SOLN
INTRAVENOUS | Status: AC
  Filled 2020-12-03: qty 1

## 2020-12-03 MED ORDER — BUPIVACAINE LIPOSOME 1.3 % IJ SUSP
INTRAMUSCULAR | Status: DC | PRN
  Administered 2020-12-03: 15 mL via PERINEURAL

## 2020-12-03 MED ORDER — CARBIDOPA-LEVODOPA 25-100 MG PO TABS
1.5000 | ORAL_TABLET | Freq: Three times a day (TID) | ORAL | Status: DC
  Administered 2020-12-03 – 2020-12-04 (×2): 1.5 via ORAL
  Filled 2020-12-03 (×5): qty 1.5

## 2020-12-03 MED ORDER — LIDOCAINE HCL (PF) 2 % IJ SOLN
INTRAMUSCULAR | Status: AC
  Filled 2020-12-03: qty 5

## 2020-12-03 MED ORDER — MAGNESIUM HYDROXIDE 400 MG/5ML PO SUSP: 30.0000 mL | Freq: Every day | ORAL | Status: DC | PRN

## 2020-12-03 MED ORDER — DEXTROSE 5 % IV SOLN
3.0000 g | Freq: Four times a day (QID) | INTRAVENOUS | Status: AC
  Administered 2020-12-03 – 2020-12-04 (×3): 3 g via INTRAVENOUS
  Filled 2020-12-03 (×2): qty 3
  Filled 2020-12-03: qty 3000

## 2020-12-03 MED ORDER — LEVOTHYROXINE SODIUM 50 MCG PO TABS
50.0000 ug | ORAL_TABLET | Freq: Every day | ORAL | Status: DC
  Administered 2020-12-04: 50 ug via ORAL
  Filled 2020-12-03: qty 1

## 2020-12-03 MED ORDER — CHLORHEXIDINE GLUCONATE 0.12 % MT SOLN
OROMUCOSAL | Status: AC
  Administered 2020-12-03: 15 mL via OROMUCOSAL
  Filled 2020-12-03: qty 15

## 2020-12-03 MED ORDER — DIPHENHYDRAMINE HCL 12.5 MG/5ML PO ELIX: 12.5000 mg | ORAL_SOLUTION | ORAL | Status: DC | PRN

## 2020-12-03 MED ORDER — DULOXETINE HCL 60 MG PO CPEP
60.0000 mg | ORAL_CAPSULE | Freq: Two times a day (BID) | ORAL | Status: DC
  Administered 2020-12-03 – 2020-12-04 (×2): 60 mg via ORAL
  Filled 2020-12-03 (×3): qty 1

## 2020-12-03 MED ORDER — BUPIVACAINE-EPINEPHRINE (PF) 0.5% -1:200000 IJ SOLN
INTRAMUSCULAR | Status: AC
  Filled 2020-12-03: qty 30

## 2020-12-03 MED ORDER — KETOROLAC TROMETHAMINE 15 MG/ML IJ SOLN
INTRAMUSCULAR | Status: AC
  Administered 2020-12-03: 15 mg via INTRAVENOUS
  Filled 2020-12-03: qty 1

## 2020-12-03 MED ORDER — DEXAMETHASONE SODIUM PHOSPHATE 10 MG/ML IJ SOLN
INTRAMUSCULAR | Status: AC
  Filled 2020-12-03: qty 1

## 2020-12-03 MED ORDER — OXYBUTYNIN CHLORIDE 5 MG PO TABS
5.0000 mg | ORAL_TABLET | Freq: Two times a day (BID) | ORAL | Status: DC
  Administered 2020-12-03 – 2020-12-04 (×2): 5 mg via ORAL
  Filled 2020-12-03 (×3): qty 1

## 2020-12-03 MED ORDER — ONDANSETRON HCL 4 MG/2ML IJ SOLN
INTRAMUSCULAR | Status: DC | PRN
  Administered 2020-12-03 (×3): 4 mg via INTRAVENOUS

## 2020-12-03 MED ORDER — MIDAZOLAM HCL 2 MG/2ML IJ SOLN
INTRAMUSCULAR | Status: AC
  Filled 2020-12-03: qty 2

## 2020-12-03 MED ORDER — WARFARIN SODIUM 1 MG PO TABS
1.0000 mg | ORAL_TABLET | Freq: Every day | ORAL | Status: DC
Start: 2020-12-03 — End: 2020-12-04
  Filled 2020-12-03: qty 1

## 2020-12-03 MED ORDER — WARFARIN SODIUM 10 MG PO TABS
10.0000 mg | ORAL_TABLET | Freq: Once | ORAL | Status: DC
  Filled 2020-12-03: qty 1

## 2020-12-03 MED ORDER — GLYCOPYRROLATE 0.2 MG/ML IJ SOLN
INTRAMUSCULAR | Status: AC
  Filled 2020-12-03: qty 1

## 2020-12-03 MED ORDER — ACETAMINOPHEN 160 MG/5ML PO SOLN
325.0000 mg | ORAL | Status: DC | PRN
  Filled 2020-12-03: qty 20.3

## 2020-12-03 MED ORDER — SODIUM CHLORIDE FLUSH 0.9 % IV SOLN
INTRAVENOUS | Status: AC
  Filled 2020-12-03: qty 50

## 2020-12-03 MED ORDER — ONDANSETRON HCL 4 MG/2ML IJ SOLN: 4.0000 mg | Freq: Four times a day (QID) | INTRAMUSCULAR | Status: DC | PRN

## 2020-12-03 MED ORDER — ALLOPURINOL 300 MG PO TABS
300.0000 mg | ORAL_TABLET | Freq: Every day | ORAL | Status: DC
  Administered 2020-12-04: 300 mg via ORAL
  Filled 2020-12-03: qty 1

## 2020-12-03 MED ORDER — FENTANYL CITRATE (PF) 100 MCG/2ML IJ SOLN
INTRAMUSCULAR | Status: DC | PRN
  Administered 2020-12-03 (×2): 50 ug via INTRAVENOUS

## 2020-12-03 MED ORDER — BUPIVACAINE-EPINEPHRINE (PF) 0.5% -1:200000 IJ SOLN
INTRAMUSCULAR | Status: DC | PRN
  Administered 2020-12-03: 30 mL via PERINEURAL

## 2020-12-03 MED ORDER — ACETAMINOPHEN 10 MG/ML IV SOLN
INTRAVENOUS | Status: AC
  Filled 2020-12-03: qty 100

## 2020-12-03 MED ORDER — GLYCOPYRROLATE 0.2 MG/ML IJ SOLN
INTRAMUSCULAR | Status: DC | PRN
  Administered 2020-12-03: .2 mg via INTRAVENOUS

## 2020-12-03 MED ORDER — ACETAMINOPHEN 325 MG PO TABS
325.0000 mg | ORAL_TABLET | ORAL | Status: DC | PRN
Start: 2020-12-03 — End: 2020-12-03

## 2020-12-03 MED ORDER — MIDAZOLAM HCL 2 MG/2ML IJ SOLN
1.0000 mg | Freq: Once | INTRAMUSCULAR | Status: AC
  Administered 2020-12-03: 1 mg via INTRAVENOUS

## 2020-12-03 MED ORDER — SUGAMMADEX SODIUM 500 MG/5ML IV SOLN
INTRAVENOUS | Status: DC | PRN
  Administered 2020-12-03: 600 mg via INTRAVENOUS

## 2020-12-03 MED ORDER — BUPIVACAINE HCL (PF) 0.5 % IJ SOLN
INTRAMUSCULAR | Status: DC | PRN
  Administered 2020-12-03: 5 mL via PERINEURAL

## 2020-12-03 MED ORDER — TRANEXAMIC ACID 1000 MG/10ML IV SOLN
INTRAVENOUS | Status: DC | PRN
  Administered 2020-12-03: 1000 mg via TOPICAL

## 2020-12-03 MED ORDER — VITAMIN B-12 1000 MCG PO TABS
1000.0000 ug | ORAL_TABLET | Freq: Every day | ORAL | Status: DC
  Administered 2020-12-04: 1000 ug via ORAL
  Filled 2020-12-03: qty 1

## 2020-12-03 MED ORDER — HYDROCODONE-ACETAMINOPHEN 7.5-325 MG PO TABS: 1.0000 | ORAL_TABLET | Freq: Once | ORAL | Status: DC | PRN

## 2020-12-03 MED ORDER — POTASSIUM CHLORIDE CRYS ER 10 MEQ PO TBCR
10.0000 meq | EXTENDED_RELEASE_TABLET | Freq: Three times a day (TID) | ORAL | Status: DC
  Administered 2020-12-03 – 2020-12-04 (×2): 10 meq via ORAL
  Filled 2020-12-03 (×2): qty 1

## 2020-12-03 MED ORDER — HYDRALAZINE HCL 10 MG PO TABS
10.0000 mg | ORAL_TABLET | Freq: Four times a day (QID) | ORAL | Status: DC | PRN
  Administered 2020-12-03: 10 mg via ORAL
  Filled 2020-12-03 (×2): qty 1

## 2020-12-03 MED ORDER — PROPOFOL 500 MG/50ML IV EMUL
INTRAVENOUS | Status: AC
  Filled 2020-12-03: qty 50

## 2020-12-03 MED ORDER — METOPROLOL TARTRATE 50 MG PO TABS
200.0000 mg | ORAL_TABLET | Freq: Two times a day (BID) | ORAL | Status: DC
  Administered 2020-12-03 – 2020-12-04 (×2): 200 mg via ORAL
  Filled 2020-12-03 (×2): qty 4

## 2020-12-03 MED ORDER — FLEET ENEMA 7-19 GM/118ML RE ENEM: 1.0000 | ENEMA | Freq: Once | RECTAL | Status: DC | PRN

## 2020-12-03 MED ORDER — DEXAMETHASONE SODIUM PHOSPHATE 10 MG/ML IJ SOLN
INTRAMUSCULAR | Status: DC | PRN
  Administered 2020-12-03: 10 mg via INTRAVENOUS

## 2020-12-03 MED ORDER — ACETAMINOPHEN 10 MG/ML IV SOLN
INTRAVENOUS | Status: DC | PRN
  Administered 2020-12-03: 1000 mg via INTRAVENOUS

## 2020-12-03 MED ORDER — HYDROCODONE-ACETAMINOPHEN 5-325 MG PO TABS: 1.0000 | ORAL_TABLET | ORAL | Status: DC | PRN

## 2020-12-03 MED ORDER — PHENYLEPHRINE HCL (PRESSORS) 10 MG/ML IV SOLN
INTRAVENOUS | Status: DC | PRN
  Administered 2020-12-03: 200 ug via INTRAVENOUS
  Administered 2020-12-03 (×3): 100 ug via INTRAVENOUS

## 2020-12-03 MED ORDER — FENTANYL CITRATE (PF) 100 MCG/2ML IJ SOLN
INTRAMUSCULAR | Status: AC
  Filled 2020-12-03: qty 2

## 2020-12-03 MED ORDER — BISACODYL 10 MG RE SUPP: 10.0000 mg | Freq: Every day | RECTAL | Status: DC | PRN

## 2020-12-03 MED ORDER — ACETAMINOPHEN 325 MG PO TABS: 325.0000 mg | ORAL_TABLET | Freq: Four times a day (QID) | ORAL | Status: DC | PRN

## 2020-12-03 MED ORDER — LIDOCAINE HCL (CARDIAC) PF 100 MG/5ML IV SOSY
PREFILLED_SYRINGE | INTRAVENOUS | Status: DC | PRN
  Administered 2020-12-03: 100 mg via INTRAVENOUS

## 2020-12-03 MED ORDER — SUCCINYLCHOLINE CHLORIDE 20 MG/ML IJ SOLN
INTRAMUSCULAR | Status: DC | PRN
  Administered 2020-12-03: 140 mg via INTRAVENOUS

## 2020-12-03 MED ORDER — EPHEDRINE 5 MG/ML INJ
INTRAVENOUS | Status: AC
  Filled 2020-12-03: qty 10

## 2020-12-03 MED ORDER — DROPERIDOL 2.5 MG/ML IJ SOLN
0.6250 mg | Freq: Once | INTRAMUSCULAR | Status: DC | PRN
  Filled 2020-12-03: qty 2

## 2020-12-03 MED ORDER — WARFARIN SODIUM 6 MG PO TABS
6.0000 mg | ORAL_TABLET | Freq: Every day | ORAL | Status: DC
  Filled 2020-12-03: qty 1

## 2020-12-03 MED ORDER — METOCLOPRAMIDE HCL 10 MG PO TABS: 5.0000 mg | ORAL_TABLET | Freq: Three times a day (TID) | ORAL | Status: DC | PRN

## 2020-12-03 MED ORDER — ADULT MULTIVITAMIN W/MINERALS CH
2.0000 | ORAL_TABLET | Freq: Every day | ORAL | Status: DC
  Administered 2020-12-04: 2 via ORAL
  Filled 2020-12-03: qty 2

## 2020-12-03 MED ORDER — ONDANSETRON HCL 4 MG/2ML IJ SOLN
INTRAMUSCULAR | Status: AC
  Filled 2020-12-03: qty 4

## 2020-12-03 MED ORDER — ROCURONIUM BROMIDE 100 MG/10ML IV SOLN
INTRAVENOUS | Status: DC | PRN
  Administered 2020-12-03: 10 mg via INTRAVENOUS
  Administered 2020-12-03: 40 mg via INTRAVENOUS

## 2020-12-03 MED ORDER — EPHEDRINE SULFATE 50 MG/ML IJ SOLN
INTRAMUSCULAR | Status: DC | PRN
  Administered 2020-12-03: 5 mg via INTRAVENOUS

## 2020-12-03 MED ORDER — PANTOPRAZOLE SODIUM 40 MG PO TBEC
40.0000 mg | DELAYED_RELEASE_TABLET | Freq: Every day | ORAL | Status: DC
  Administered 2020-12-04: 40 mg via ORAL
  Filled 2020-12-03: qty 1

## 2020-12-03 MED ORDER — SODIUM CHLORIDE 0.9 % IV SOLN: INTRAVENOUS | Status: DC

## 2020-12-03 MED ORDER — TRAMADOL HCL 50 MG PO TABS: 50.0000 mg | ORAL_TABLET | Freq: Four times a day (QID) | ORAL | Status: DC | PRN

## 2020-12-03 MED ORDER — KETOROLAC TROMETHAMINE 15 MG/ML IJ SOLN: 15.0000 mg | Freq: Once | INTRAMUSCULAR | Status: AC

## 2020-12-03 MED ORDER — ACETAMINOPHEN 500 MG PO TABS
500.0000 mg | ORAL_TABLET | Freq: Four times a day (QID) | ORAL | Status: AC
  Administered 2020-12-03 – 2020-12-04 (×3): 500 mg via ORAL
  Filled 2020-12-03 (×3): qty 1

## 2020-12-03 MED ORDER — MAGNESIUM GLUCONATE 500 MG PO TABS
500.0000 mg | ORAL_TABLET | Freq: Every day | ORAL | Status: DC
  Administered 2020-12-03 – 2020-12-04 (×2): 500 mg via ORAL
  Filled 2020-12-03 (×2): qty 1

## 2020-12-03 SURGICAL SUPPLY — 70 items
BASEPLATE GLENOSPHERE 25 (Plate) ×2 IMPLANT
BEARING HUMERAL SHLDER 36M STD (Shoulder) ×1 IMPLANT
BIT DRILL TWIST 2.7 (BIT) ×2 IMPLANT
BLADE SAW SAG 25X90X1.19 (BLADE) ×2 IMPLANT
CHLORAPREP W/TINT 26 (MISCELLANEOUS) ×2 IMPLANT
COOLER POLAR GLACIER W/PUMP (MISCELLANEOUS) ×2 IMPLANT
COVER BACK TABLE REUSABLE LG (DRAPES) ×2 IMPLANT
COVER WAND RF STERILE (DRAPES) ×2 IMPLANT
DRAPE 3/4 80X56 (DRAPES) ×4 IMPLANT
DRAPE IMP U-DRAPE 54X76 (DRAPES) ×4 IMPLANT
DRAPE INCISE IOBAN 66X45 STRL (DRAPES) ×4 IMPLANT
DRSG OPSITE POSTOP 4X8 (GAUZE/BANDAGES/DRESSINGS) ×2 IMPLANT
ELECT BLADE 6.5 EXT (BLADE) IMPLANT
ELECT CAUTERY BLADE 6.4 (BLADE) ×2 IMPLANT
ELECT REM PT RETURN 9FT ADLT (ELECTROSURGICAL) ×2
ELECTRODE REM PT RTRN 9FT ADLT (ELECTROSURGICAL) ×1 IMPLANT
GAUZE XEROFORM 1X8 LF (GAUZE/BANDAGES/DRESSINGS) ×2 IMPLANT
GLENOID SPHERE STD STRL 36MM (Orthopedic Implant) ×2 IMPLANT
GLOVE INDICATOR 8.0 STRL GRN (GLOVE) ×2 IMPLANT
GLOVE SRG 8 PF TXTR STRL LF DI (GLOVE) ×1 IMPLANT
GLOVE SURG ENC MOIS LTX SZ7.5 (GLOVE) ×8 IMPLANT
GLOVE SURG ENC MOIS LTX SZ8 (GLOVE) ×8 IMPLANT
GLOVE SURG UNDER POLY LF SZ8 (GLOVE) ×2
GOWN STRL REUS W/ TWL LRG LVL3 (GOWN DISPOSABLE) ×1 IMPLANT
GOWN STRL REUS W/ TWL XL LVL3 (GOWN DISPOSABLE) ×1 IMPLANT
GOWN STRL REUS W/TWL LRG LVL3 (GOWN DISPOSABLE) ×2
GOWN STRL REUS W/TWL XL LVL3 (GOWN DISPOSABLE) ×2
HOOD PEEL AWAY FLYTE STAYCOOL (MISCELLANEOUS) ×8 IMPLANT
ILLUMINATOR WAVEGUIDE N/F (MISCELLANEOUS) IMPLANT
KIT STABILIZATION SHOULDER (MISCELLANEOUS) ×2 IMPLANT
KIT TURNOVER KIT A (KITS) ×2 IMPLANT
MANIFOLD NEPTUNE II (INSTRUMENTS) ×2 IMPLANT
MASK FACE SPIDER DISP (MASK) ×2 IMPLANT
MAT ABSORB  FLUID 56X50 GRAY (MISCELLANEOUS) ×2
MAT ABSORB FLUID 56X50 GRAY (MISCELLANEOUS) ×1 IMPLANT
NDL SAFETY ECLIPSE 18X1.5 (NEEDLE) ×1 IMPLANT
NEEDLE HYPO 18GX1.5 SHARP (NEEDLE) ×2
NEEDLE HYPO 22GX1.5 SAFETY (NEEDLE) ×2 IMPLANT
NEEDLE SPNL 20GX3.5 QUINCKE YW (NEEDLE) ×2 IMPLANT
NS IRRIG 500ML POUR BTL (IV SOLUTION) ×2 IMPLANT
PACK ARTHROSCOPY SHOULDER (MISCELLANEOUS) ×2 IMPLANT
PAD ARMBOARD 7.5X6 YLW CONV (MISCELLANEOUS) ×2 IMPLANT
PAD WRAPON POLAR SHDR UNIV (MISCELLANEOUS) IMPLANT
PAD WRAPON POLAR SHDR XLG (MISCELLANEOUS) ×1 IMPLANT
PENCIL SMOKE EVACUATOR (MISCELLANEOUS) ×2 IMPLANT
PIN THREADED REVERSE (PIN) ×2 IMPLANT
PULSAVAC PLUS IRRIG FAN TIP (DISPOSABLE) ×2
SCREW BONE LOCKING 4.75X30X3.5 (Screw) ×2 IMPLANT
SCREW BONE LOCKING 4.75X40X3.5 (Screw) ×2 IMPLANT
SCREW BONE STRL 6.5MMX25MM (Screw) ×2 IMPLANT
SCREW LOCKING 4.75MMX15MM (Screw) ×4 IMPLANT
SHOULDER HUMERAL BEAR 36M STD (Shoulder) ×2 IMPLANT
SLING ULTRA II LG (MISCELLANEOUS) ×2 IMPLANT
SLING ULTRA II M (MISCELLANEOUS) IMPLANT
SOL .9 NS 3000ML IRR  AL (IV SOLUTION) ×2
SOL .9 NS 3000ML IRR UROMATIC (IV SOLUTION) ×1 IMPLANT
SPONGE LAP 18X18 RF (DISPOSABLE) ×2 IMPLANT
STAPLER SKIN PROX 35W (STAPLE) ×2 IMPLANT
STEM HUMERAL STRL 11MMX55MM (Stem) ×2 IMPLANT
SUT ETHIBOND 0 MO6 C/R (SUTURE) ×2 IMPLANT
SUT FIBERWIRE #2 38 BLUE 1/2 (SUTURE) ×8
SUT VIC AB 0 CT1 36 (SUTURE) ×2 IMPLANT
SUT VIC AB 2-0 CT1 27 (SUTURE) ×4
SUT VIC AB 2-0 CT1 TAPERPNT 27 (SUTURE) ×2 IMPLANT
SUTURE FIBERWR #2 38 BLUE 1/2 (SUTURE) ×4 IMPLANT
SYR 10ML LL (SYRINGE) ×2 IMPLANT
TIP FAN IRRIG PULSAVAC PLUS (DISPOSABLE) ×1 IMPLANT
TRAY HUM MINI SHOULDER +0 40D (Shoulder) ×2 IMPLANT
WRAPON POLAR PAD SHDR UNIV (MISCELLANEOUS)
WRAPON POLAR PAD SHDR XLG (MISCELLANEOUS) ×2

## 2020-12-03 NOTE — Anesthesia Postprocedure Evaluation (Signed)
Anesthesia Post Note  Patient: Suzanne Dixon  Procedure(s) Performed: REVERSE SHOULDER ARTHROPLASTY (Right Shoulder)  Patient location during evaluation: PACU Anesthesia Type: General Level of consciousness: awake and alert Pain management: pain level controlled (Good block function) Vital Signs Assessment: post-procedure vital signs reviewed and stable Respiratory status: spontaneous breathing, nonlabored ventilation and respiratory function stable Cardiovascular status: blood pressure returned to baseline and stable Postop Assessment: no apparent nausea or vomiting Anesthetic complications: no   No complications documented.   Last Vitals:  Vitals:   12/03/20 1115 12/03/20 1149  BP:  (!) 155/96  Pulse:  69  Resp:    Temp: (!) 36.4 C (!) 36.1 C  SpO2:  98%    Last Pain:  Vitals:   12/03/20 1201  TempSrc:   PainSc: 0-No pain                 Alphonsus Sias

## 2020-12-03 NOTE — Evaluation (Signed)
Physical Therapy Evaluation Patient Details Name: Suzanne Dixon MRN: [id number] DOB: 1950/11/06 Today's Date: 12/03/2020   History of Present Illness  Pt is a 70 yo female s/p R reverse TSA. PMH of sleep apnea, HTN, afib, breast cancer.  Clinical Impression  Patient alert, RN and CNA at bedside at pt request due to needing to urinate, bedpan placed. Family also at bedside. Pt independent at baseline, has her daughter coming to stay with her at discharge to assist as needed. Normally uses a quad cane or walker if needed depending on the day, has a ramp to get into her house.   Pt able to perform bed rolling CGA. Supine to sit with handheld assist, not necessarily required. Good sitting balance noted. The patient was able to sit <> stand from EOB with cueing for hand placement, and stand ~48minute for pericare with fair balance. Pt able to take 2-3 steps to recliner in room, CGA no unsteadiness noted. Further mobility deferred due to CNA in room to assess vitals, and pts elevated BP; 170/105.  Overall the patient demonstrated deficits (see "PT Problem List") that impede the patient's functional abilities, safety, and mobility and would benefit from skilled PT intervention. Recommendation is HHPT pending pt progress with ambulation next session.     Follow Up Recommendations Home health PT;Supervision/Assistance - 24 hour    Equipment Recommendations  None recommended by PT    Recommendations for Other Services       Precautions / Restrictions Precautions Precautions: Fall;Shoulder Shoulder Interventions: Shoulder sling/immobilizer;Shoulder abduction pillow;At all times;Off for dressing/bathing/exercises Restrictions Weight Bearing Restrictions: Yes RUE Weight Bearing: Non weight bearing      Mobility  Bed Mobility Overal bed mobility: Needs Assistance Bed Mobility: Supine to Sit     Supine to sit: Min assist;HOB elevated     General bed mobility comments: handheld assist  provided, but not required    Transfers Overall transfer level: Needs assistance Equipment used: Quad cane Transfers: Sit to/from Stand Sit to Stand: Min guard            Ambulation/Gait Ambulation/Gait assistance: Counsellor (Feet): 3 Feet Assistive device: Quad cane Gait Pattern/deviations: WFL(Within Functional Limits)     General Gait Details: further mobility deferred due to elevated BP, tech in room to assess vitals: 170/105  Stairs            Wheelchair Mobility    Modified Rankin (Stroke Patients Only)       Balance Overall balance assessment: Needs assistance Sitting-balance support: Feet supported Sitting balance-Leahy Scale: Good       Standing balance-Leahy Scale: Fair Standing balance comment: use of quad cane for ambulation                             Pertinent Vitals/Pain Pain Assessment: No/denies pain    Home Living Family/patient expects to be discharged to:: Private residence Living Arrangements: Alone Available Help at Discharge: Family;Other (Comment);Available 24 hours/day (daughter will be staying with her for two weeks) Type of Home: House Home Access: Ramped entrance     Home Layout: One level Home Equipment: Grab bars - toilet;Grab bars - tub/shower;Bedside commode;Walker - 2 wheels;Walker - 4 wheels;Cane - quad      Prior Function Level of Independence: Independent               Hand Dominance   Dominant Hand: Left    Extremity/Trunk Assessment   Upper Extremity  Assessment Upper Extremity Assessment: Defer to OT evaluation;RUE deficits/detail RUE Deficits / Details: s/p reverse TSA    Lower Extremity Assessment Lower Extremity Assessment: Overall WFL for tasks assessed    Cervical / Trunk Assessment Cervical / Trunk Assessment: Normal  Communication   Communication: No difficulties  Cognition Arousal/Alertness: Awake/alert Behavior During Therapy: WFL for tasks  assessed/performed Overall Cognitive Status: Within Functional Limits for tasks assessed                                        General Comments      Exercises     Assessment/Plan    PT Assessment Patient needs continued PT services  PT Problem List Decreased strength;Decreased mobility;Decreased range of motion;Decreased activity tolerance;Pain;Decreased knowledge of use of DME;Decreased knowledge of precautions       PT Treatment Interventions DME instruction;Therapeutic exercise;Gait training;Balance training;Neuromuscular re-education;Functional mobility training;Therapeutic activities;Patient/family education    PT Goals (Current goals can be found in the Care Plan section)  Acute Rehab PT Goals Patient Stated Goal: to go home, return to PLOF PT Goal Formulation: With patient Time For Goal Achievement: 12/17/20 Potential to Achieve Goals: Good    Frequency 7X/week   Barriers to discharge        Co-evaluation               AM-PAC PT "6 Clicks" Mobility  Outcome Measure Help needed turning from your back to your side while in a flat bed without using bedrails?: A Little Help needed moving from lying on your back to sitting on the side of a flat bed without using bedrails?: A Little Help needed moving to and from a bed to a chair (including a wheelchair)?: A Little Help needed standing up from a chair using your arms (e.g., wheelchair or bedside chair)?: A Little Help needed to walk in hospital room?: A Little Help needed climbing 3-5 steps with a railing? : A Little 6 Click Score: 18    End of Session   Activity Tolerance: Patient tolerated treatment well;Other (comment);Treatment limited secondary to medical complications (Comment) (limited by elevated BP) Patient left: in chair;with chair alarm set;with call bell/phone within reach Nurse Communication: Mobility status PT Visit Diagnosis: Other abnormalities of gait and mobility  (R26.89);Muscle weakness (generalized) (M62.81);Pain Pain - Right/Left: Right Pain - part of body: Shoulder    Time: 8466-5993 PT Time Calculation (min) (ACUTE ONLY): 25 min   Charges:   PT Evaluation $PT Eval Low Complexity: 1 Low PT Treatments $Therapeutic Activity: 8-22 mins       Lieutenant Diego PT, DPT 2:12 PM,12/03/20

## 2020-12-03 NOTE — H&P (Signed)
History of Present Illness:  Suzanne Dixon is a 70 y.o. female who presents with right shoulder pain and weakness. The patient's symptoms began 6 to 7 months ago and developed and developed after a fall in her home. She recalls that the EMT people lifted her up by her right shoulder and wonders if this may have further aggravated her shoulder symptoms. She saw Cameron Proud, PA-C, several months later who administered a steroid injection which she states provided only a few weeks worth of relief before her symptoms began to recur. Therefore, the patient was sent for a CT scan of the shoulder and referred to me for further evaluation and treatment.   The patient describes the symptoms as marked (major pain with significant limitations) and have the quality of being aching, miserable, nagging, tender and throbbing. The pain is localized to the lateral arm/shoulder and localized to the anterior shoulder. These symptoms are aggravated with normal daily activities, with sleeping, carrying heavy objects, at higher levels of activity, with overhead activity and reaching behind the back. She has tried acetaminophen and non-steroidal anti-inflammatories (Mobic) with temporary partial relief. She has tried rest with limited benefit. She has tried the 1 injection described above which provided only 1 to 2 weeks worth of relief. The patient denies any neck pain, nor does she note any numbness or paresthesias down her arm to her hand. This complaint is not work related. She is a sports non-participant.  Shoulder Surgical History:  The patient is now 3.5 years status post a right shoulder arthroscopy with debridement and repair of a massive rotator cuff tear of the right shoulder.  PMH/PSH/Family History/Social History/Meds/Allergies:  I have reviewed past medical, surgical, social and family history, medications and allergies as documented in the EMR.  Current Outpatient Medications: . allopurinoL (ZYLOPRIM) 300 MG  tablet Take 1 tablet by mouth daily. 90 tablet 1  . calcium carbonate-vitamin D3 (CALCIUM 600 + D,3,) 600 mg(1,500mg ) -400 unit tablet Take 1 tablet by mouth 2 (two) times daily with meals.  . carbidopa-levodopa (SINEMET) 25-100 mg tablet Take 2 tablets by mouth 4 (four) times daily for 90 days 720 tablet 3  . cyanocobalamin (VITAMIN B12) 1000 MCG tablet Take 1,000 mcg by mouth once daily.  . DULoxetine (CYMBALTA) 60 MG DR capsule Take 1 capsule by mouth twice daily. 180 capsule 2  . fluticasone propionate (FLONASE) 50 mcg/actuation nasal spray  . gabapentin (NEURONTIN) 600 MG tablet Take 1 tablet (600 mg total) by mouth 4 (four) times daily 360 tablet 3  . levothyroxine (SYNTHROID) 50 MCG tablet Take 1 tablet by mouth on an empty stomach with a glass of water at least 30 - 60 minutes before breakfast. 90 tablet 0  . magnesium oxide (MAG-OX) 400 mg tablet Take 400 mg by mouth once daily.  . meloxicam (MOBIC) 15 MG tablet Take 1 tablet (15 mg total) by mouth once daily 20 tablet 0  . metoprolol tartrate (LOPRESSOR) 100 MG tablet Take 2 tablets by mouth twice daily. 360 tablet 0  . miscellaneous medical supply Misc Use 1 each as directed 1 each 0  . multivitamin (MULTIVITAMIN) tablet Take 2 tablets by mouth once daily.  Marland Kitchen omeprazole (PRILOSEC) 20 MG DR capsule Take 1 capsule by mouth daily. 90 capsule 0  . oxybutynin (DITROPAN) 5 mg tablet Take 1 tablet by mouth twice daily. 180 tablet 0  . potassium chloride (KLOR-CON) 10 mEq ER tablet Take 1 tablet by mouth three times daily. 270 tablet 0  .  warfarin (COUMADIN) 1 MG tablet Take 1 tablet by mouth daily. 30 tablet 10  . warfarin (COUMADIN) 6 MG tablet Take 1 tablet by mouth daily. 90 tablet 0   Allergies:  . Advair Diskus [Fluticasone Propion-Salmeterol] Muscle Pain  . Alendronate - Muscle Pain  . Montelukast - "muscle pain"  . Sulfa (Sulfonamide Antibiotics) - Rash  . Venlafaxine Dizziness and Other ("altered mental status/tremors")   Past  Medical History:  . Age-related osteoporosis without current pathological fracture 12/08/2017  . Allergic state  . Anxiety  . Arthritis  cervical DDD  . Atrial fibrillation (CMS-HCC)  . B12 deficiency 02/24/2014  . BMI 50.0-59.9, adult (CMS-HCC) 12/01/2015  . Cancer (CMS-HCC)  right breast 2014  . Depression  . Edema of both legs  . Gout  a. Polyarticular. b. Colchine-allopurinol. ([place])  . Hidradenitis  . History of Paget's disease of breast  . Hypertension  . 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 Followed by Dr. Inez Pilgrim. PET scan, electrophoresis studies negative  . Obesity  Followed by [facility]  . Osteoarthritis  a. Cervical disc disease. b. Dropped arches. c. Hand OA. d. Trigger nodule. [facility])  . Peripheral neuropathy  EMG, Neuro eval 2012  . Sleep apnea (cpap 9)  . Stress incontinence in female  . Thyroid disease  . Vitamin D deficiency   Past Surgical History:  . CESAREAN SECTION 1987  . CHOLECYSTECTOMY  . COLONOSCOPY 05/10/2005 (Hyperplastic Polyp: CBF 04/2015; Recall Ltr mailed 03/02/2015 (dw))  . COLONOSCOPY 06/22/2015 (Int Hemorrhoids, Diverticulosis: CBF 06/2015)  . EGD 10/18/2010 (No repeat per RTE)  . HYSTERECTOMY  . LAMINOTOMY / EXCISION DISK POSTERIOR CERVICAL SPINE  . LAPAROSCOPIC GASTRIC BYPASS  . LAPAROSCOPIC GASTRIC BYPASS 2010  . Limited arthroscopic debridement,arthroscopic subacromial decompression,mini-open repair of massive rotator cuff tear,and mini-open biceps tenodesis,right shoulder Right 01/03/2017 (Dr.Tyreak Reagle)  . MASTECTOMY MODIFIED RADICAL Right  . MASTECTOMY PARTIAL / LUMPECTOMY Right 2014  . NEUROPLASTY / TRANSPOSITION ULNAR NERVE AT ELBOW Left  . OOPHORECTOMY Apr.,1996  . PORTOCAVAL SHUNT PLACEMENT Right 2014 (Dcd 8/ 24//2016)  . REDUCTION MAMMAPLASTY  . SPINE SURGERY Dec,2003   Family History:  . Asthma Mother  . Diabetes type II Mother  . High blood pressure (Hypertension) Mother  . Lymphoma Mother   . High blood pressure (Hypertension) Father  . High blood pressure (Hypertension) Maternal Grandmother  . High blood pressure (Hypertension) Maternal Grandfather   Social History:   Socioeconomic History:  Marland Kitchen Marital status: Divorced  Spouse name: Not on file  . Number of children: Not on file  . Years of education: Not on file  . Highest education level: Not on file  Occupational History  . Not on file  Tobacco Use  . Smoking status: Never Smoker  . Smokeless tobacco: Never Used  Vaping Use  . Vaping Use: Never used  Substance and Sexual Activity  . Alcohol use: No  . Drug use: No  . Sexual activity: Never  Other Topics Concern  . Not on file  Social History Narrative  . Not on file   Social Determinants of Health:   Financial Resource Strain: Not on file  Food Insecurity: Not on file  Transportation Needs: Not on file   Review of Systems:  A comprehensive 14 point ROS was performed, reviewed, and the pertinent orthopaedic findings are documented in the HPI.  Physical Exam:  Vitals:  08/10/20 1353 08/10/20 1403  BP: 132/82  Weight: (!) 133.5 kg (294 lb 6.4 oz)  Height:  162.6 cm (5\' 4" )  PainSc: 2 2  PainLoc: Back Back   General/Constitutional: Pleasant significantly overweight elderly female in no acute distress. Neuro/Psych: Normal mood and affect, oriented to person, place and time. Eyes: Non-icteric. Pupils are equal, round, and reactive to light, and exhibit synchronous movement. ENT: Unremarkable. Lymphatic: No palpable adenopathy. Respiratory: Lungs clear to auscultation, Normal chest excursion, No wheezes and Non-labored breathing Cardiovascular: Regular rate and rhythm. No murmurs. and No edema, swelling or tenderness, except as noted in detailed exam. Integumentary: No impressive skin lesions present, except as noted in detailed exam. Musculoskeletal: Unremarkable, except as noted in detailed exam.  Right shoulder exam: SKIN: Well-healed surgical  incision and arthroscopic portal sites, otherwise unremarkable. SWELLING: none WARMTH: none LYMPH NODES: no adenopathy palpable CREPITUS: none TENDERNESS: Minimally tender over anterolateral shoulder ROM (active):  Forward flexion: 90 degrees Abduction: 80 degrees Internal rotation: L2 ROM (passive):  Forward flexion: 125 degrees Abduction: 120 degrees  ER/IR at 90 abd: 85 degrees / 55 degrees  She has moderate pain with all motions.  STRENGTH: Forward flexion: 3/5 Abduction: 3/5 External rotation: 4/5 Internal rotation: 4-4+/5 Pain with RC testing: Moderate pain with resisted forward flexion and abduction  STABILITY: Normal  SPECIAL TESTS: Luan Pulling' test: positive, moderate Speed's test: negative Capsulitis - pain w/ passive ER: no Crossed arm test: Mildly positive Crank: Not evaluated Anterior apprehension: Negative Posterior apprehension: Not evaluated  She is neurovascularly intact to the right upper extremity.  Imaging:  Shoulder Imaging, CT: Right Shoulder: A recent CT scan of the right shoulder is available for review. By report, the scan demonstrates evidence of advanced degenerative joint disease of the glenohumeral joint with a high riding humeral head, suggestive of a massive rotator cuff tear. There also is evidence of at least moderate atrophy of the supraspinatus, infraspinatus, and teres minor muscles. Both the films report reviewed by myself and discussed with the patient.  Assessment:  1. Right rotator cuff tear arthropathy  2. Nontraumatic complete tear of right rotator cuff  3. Biceps tendinitis of right upper extremity  4. Tendinitis of right rotator cuff  Plan:  The treatment options were discussed with the patient. In addition, patient educational materials were provided regarding the diagnosis and treatment options. The patient is quite frustrated by her symptoms and functional limitations, and is ready to consider more aggressive treatment options.  Therefore, I have recommended a surgical procedure, specifically a reverse right total shoulder arthroplasty. The procedure was discussed with the patient, as were the potential risks (including bleeding, infection, nerve and/or blood vessel injury, persistent or recurrent pain, loosening and/or failure of the components, dislocation, need for further surgery, blood clots, strokes, heart attacks and/or arhythmias, pneumonia, etc.) and benefits. The patient states his/her understanding and wishes to proceed. All of the patient's questions and concerns were answered. She can call any time with further concerns. She will follow up post-surgery, routine.   H&P reviewed and patient re-examined. No changes.

## 2020-12-03 NOTE — Anesthesia Procedure Notes (Signed)
Anesthesia Regional Block: Interscalene brachial plexus block   Pre-Anesthetic Checklist: ,, timeout performed, Correct Patient, Correct Site, Correct Laterality, Correct Procedure, Correct Position, site marked, Risks and benefits discussed,  Surgical consent,  Pre-op evaluation,  At surgeon's request and post-op pain management  Laterality: Upper and Right  Prep: chloraprep       Needles:  Injection technique: Single-shot  Needle Type: Echogenic Stimulator Needle     Needle Length: 10cm  Needle Gauge: 21   Needle insertion depth: 6 cm   Additional Needles:   Procedures: Doppler guided,,,, ultrasound used (permanent image in chart),,,,  Motor weakness within 12 minutes.  Narrative:  Start time: 12/03/2020 7:28 AM End time: 12/03/2020 7:30 AM Injection made incrementally with aspirations every 5 mL.  Performed by: Personally  Anesthesiologist: Alphonsus Sias, MD  Additional Notes: Functioning IV was confirmed and O2 /monitors were applied. Light sedation administered as required, patient responsive throughout. A 13mm 21ga EchoStim needle was used. Sterile prep and drape,hand hygiene and sterile gloves were used.  Negative aspiration and negative test dose prior to incremental administration of local anesthetic. 1% Lidocaine for skin wheal, 3 ml. Total LA: 63ml - Exparel 63ml & 0.5% Bupivicaine 63ml. U/S images stored in chart. The patient tolerated the procedure well. U/S Images stored.

## 2020-12-03 NOTE — Transfer of Care (Signed)
Immediate Anesthesia Transfer of Care Note  Patient: Suzanne Dixon  Procedure(s) Performed: REVERSE SHOULDER ARTHROPLASTY (Right Shoulder)  Patient Location: PACU  Anesthesia Type:General  Level of Consciousness: awake, drowsy and patient cooperative  Airway & Oxygen Therapy: Patient Spontanous Breathing and Patient connected to face mask oxygen  Post-op Assessment: Report given to RN and Post -op Vital signs reviewed and stable  Post vital signs: Reviewed and stable  Last Vitals:  Vitals Value Taken Time  BP 159/102 12/03/20 1016  Temp 36 C 12/03/20 1015  Pulse 67 12/03/20 1023  Resp 18 12/03/20 1023  SpO2 100 % 12/03/20 1023  Vitals shown include unvalidated device data.  Last Pain:  Vitals:   12/03/20 0658  TempSrc: Temporal  PainSc: 0-No pain         Complications: No complications documented.

## 2020-12-03 NOTE — Anesthesia Preprocedure Evaluation (Addendum)
Anesthesia Evaluation  Patient identified by MRN, date of birth, ID band Patient awake    Reviewed: Allergy & Precautions, H&P , NPO status , reviewed documented beta blocker date and time   History of Anesthesia Complications (+) history of anesthetic complications  Airway Mallampati: II  TM Distance: >3 FB Neck ROM: full    Dental  (+) Chipped, Teeth Intact   Pulmonary shortness of breath, sleep apnea and Continuous Positive Airway Pressure Ventilation ,    Pulmonary exam normal        Cardiovascular hypertension, Normal cardiovascular exam+ dysrhythmias Atrial Fibrillation   2014 ECHO Summary:   1. Left ventricular ejection fraction, by visual estimation, is 60 to  65%.   2. Mildly increased RV wall thickness.   3. Mildly dilated right atrium.   4. Mildly elevated pulmonary artery systolic pressure.   5. Mildly increased left ventricular posterior wall thickness.   6. Mild tricuspid regurgitation.    Neuro/Psych PSYCHIATRIC DISORDERS Depression  Neuromuscular disease    GI/Hepatic GERD  Medicated and Controlled,  Endo/Other  Hypothyroidism Morbid obesity  Renal/GU      Musculoskeletal  (+) Arthritis ,   Abdominal   Peds  Hematology  (+) Blood dyscrasia, anemia ,   Anesthesia Other Findings Past Medical History: No date: A-fib [facility]) No date: Anemia No date: B12 deficiency 03/31/2013: Breast cancer ([place])     Comment:  Right - chemo- mastectomy 1991: Breast cancer ([place])     Comment:  Rt.- radiation 76/1950: Complication of anesthesia     Comment:  Recent years with general anesthesia had itching               following surgery No date: DDD (degenerative disc disease), cervical No date: Depression No date: Diverticulitis No date: Dyspnea No date: Edema of both legs No date: GERD (gastroesophageal reflux disease) No date: Gout 2014: H/O mastectomy     Comment:  per patient 35: H/O: cesarean  section     Comment:  per patient report 53: H/O: hysterectomy     Comment:  per patient No date: Hydradenitis No date: Hypertension No date: Hypothyroidism No date: Neuropathy 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[place]) No date: Parkinson's disease ([place]) No date: Personal history of chemotherapy No date: Personal history of radiation therapy 2002: Ruptured cervical disc     Comment:  per patient  No date: Sleep apnea     Comment:  cpap machine No date: Tachycardia Past Surgical History: No date: ABDOMINAL HYSTERECTOMY 2002: BACK SURGERY     Comment:  plate and 2 screws in neck 9326,7124: BREAST BIOPSY; Right     Comment:  Positive No date: BREAST CYST ASPIRATION; Left 2014: BREAST SURGERY; Right     Comment:  mastectomy 05/17/2019: CATARACT EXTRACTION W/PHACO; Left     Comment:  Procedure: CATARACT EXTRACTION PHACO AND INTRAOCULAR               LENS PLACEMENT (IOC);  Surgeon: Eulogio Bear, MD;                Location: [facility] ORS;  Service: Ophthalmology;  Laterality:              Left;  [place] 00:28 CDE 2.15 FLUID PACK LOT # [id number] 06/14/2019: CATARACT EXTRACTION W/PHACO; Right     Comment:  Procedure: CATARACT EXTRACTION PHACO AND INTRAOCULAR               LENS PLACEMENT (IOC) RIGHT;  Surgeon: Eulogio Bear,  MD;  Location: [facility] ORS;  Service: Ophthalmology;                Laterality: Right;  [place] 00:35.0 CDE 2.37 Fluid Pack Lot               #[id number] No date: CESAREAN SECTION 04/09/2012: CHOLECYSTECTOMY 06/22/2015: COLONOSCOPY WITH PROPOFOL; N/A     Comment:  Procedure: COLONOSCOPY WITH PROPOFOL;  Surgeon: Manya Silvas, MD;  Location: [facility];  Service:               Endoscopy;  Laterality: N/A; No date: ESOPHAGOGASTRODUODENOSCOPY No date: EYE SURGERY No date: LAPAROSCOPIC GASTRIC BYPASS No date: MASTECTOMY; Right No date: NEUROPLASTY / TRANSPOSITION ULNAR NERVE AT ELBOW 05/13/2015: PORT-A-CATH REMOVAL; Left     Comment:   Procedure: REMOVAL PORT-A-CATH LEFT CHEST ;  Surgeon:               Florene Glen, MD;  Location: [facility] ORS;  Service:               General;  Laterality: Left; No date: REDUCTION MAMMAPLASTY; Left 01/26/2009: ROUX-EN-Y GASTRIC BYPASS 01/03/2017: SHOULDER ARTHROSCOPY WITH OPEN ROTATOR CUFF REPAIR; Right     Comment:  Procedure: SHOULDER ARTHROSCOPY WITH OPEN ROTATOR CUFF               REPAIR;  Surgeon: Corky Mull, MD;  Location: [facility] ORS;               Service: Orthopedics;  Laterality: Right; 01/03/2017: SHOULDER ARTHROSCOPY WITH SUBACROMIAL DECOMPRESSION,  ROTATOR CUFF REPAIR AND BICEP TENDON REPAIR; Right     Comment:  Procedure: SHOULDER ARTHROSCOPY WITH SUBACROMIAL               DECOMPRESSION, ROTATOR CUFF REPAIR AND BICEP TENDON               REPAIR;  Surgeon: Corky Mull, MD;  Location: [facility] ORS;               Service: Orthopedics;  Laterality: Right;  Limited               debridement   Reproductive/Obstetrics                             Anesthesia Physical Anesthesia Plan  ASA: IV  Anesthesia Plan: General   Post-op Pain Management:  Regional for Post-op pain   Induction: Intravenous  PONV Risk Score and Plan: 3 and Ondansetron, Midazolam and Treatment may vary due to age or medical condition  Airway Management Planned: Oral ETT  Additional Equipment:   Intra-op Plan:   Post-operative Plan: Extubation in OR  Informed Consent: I have reviewed the patients History and Physical, chart, labs and discussed the procedure including the risks, benefits and alternatives for the proposed anesthesia with the patient or authorized representative who has indicated his/her understanding and acceptance.     Dental Advisory Given  Plan Discussed with: CRNA  Anesthesia Plan Comments: (Education: OSA & GA discussed, nerve block accepted)        Anesthesia Quick Evaluation

## 2020-12-03 NOTE — Anesthesia Procedure Notes (Signed)
Procedure Name: Intubation Performed by: Fletcher-Harrison, Sephira Zellman, CRNA Pre-anesthesia Checklist: Patient identified, Emergency Drugs available, Suction available and Patient being monitored Patient Re-evaluated:Patient Re-evaluated prior to induction Oxygen Delivery Method: Circle system utilized Preoxygenation: Pre-oxygenation with 100% oxygen Induction Type: IV induction Ventilation: Mask ventilation without difficulty Laryngoscope Size: McGraph and 3 Grade View: Grade I Tube type: Oral Tube size: 7.0 mm Number of attempts: 1 Airway Equipment and Method: Stylet and Oral airway Placement Confirmation: ETT inserted through vocal cords under direct vision,  positive ETCO2,  breath sounds checked- equal and bilateral and CO2 detector Secured at: 21 cm Tube secured with: Tape Dental Injury: Teeth and Oropharynx as per pre-operative assessment        

## 2020-12-03 NOTE — Op Note (Signed)
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  Post-Op Diagnosis:   Same  Procedure:   Reverse right total shoulder arthroplasty.  Surgeon:   Pascal Lux, MD  Assistant:   Cameron Proud, PA-C; Jaynie Bream, PA-S  Anesthesia:   General endotracheal with an interscalene block using Exparel placed preoperatively by the anesthesiologist.  Findings:   As above.  Complications:   None  EBL:   100 cc  Fluids:   800 cc crystalloid  UOP:   None  TT:   None  Drains:   None  Closure:   Staples  Implants:   All press-fit Biomet Comprehensive system with a #11 micro-humeral stem, a 40 mm humeral tray with a standard insert, and a mini-base plate with a 36 mm glenosphere.  Brief Clinical Note:   The patient is a 70 year old female with a history of progressively worsening right shoulder pain. The symptoms have progressed despite medications, activity modification, etc. Her history and examination are consistent with a massive irreparable rotator cuff tear with cuff arthropathy, all of which were confirmed by plain radiographs and CT scan. The patient is now 3.5 years status post a rotator cuff repair with decompression and biceps tenodesis. The patient presents at this time for a reverse right total shoulder arthroplasty.  Procedure:   The patient underwent placement of an interscalene block using Exparel by the anesthesiologist in the preoperative holding area before being brought into the operating room and lain in the supine position. The patient then underwent general endotracheal intubation and anesthesia before the patient was repositioned in the beach chair position using the beach chair positioner. The right shoulder and upper extremity were prepped with ChloraPrep solution before being draped sterilely. Preoperative antibiotics were administered.   A standard anterior  approach to the shoulder was made through an approximately 4-5 inch incision. The incision was carried down through the subcutaneous tissues to expose the deltopectoral fascia. The interval between the deltoid and pectoralis muscles was identified and this plane developed, retracting the cephalic vein laterally with the deltoid muscle. The conjoined tendon was identified. Its lateral margin was dissected and the Kolbel self-retraining retractor inserted. The "three sisters" were identified and cauterized. Bursal tissues were removed to improve visualization. The subscapularis tendon was released from its attachment to the lesser tuberosity 1 cm proximal to its insertion and several tagging sutures placed. The inferior capsule was released with care after identifying and protecting the axillary nerve. The proximal humeral cut was made at approximately 25 of retroversion using the extra-medullary guide.   Attention was redirected to the glenoid. The labrum was debrided circumferentially before the center of the glenoid was marked with electrocautery. The guidewire was drilled into the glenoid neck using the appropriate guide. After verifying its position, it was overreamed with the mini-baseplate reamer to create a flat surface. The permanent mini-baseplate was impacted into place. It was stabilized with a 25 x 6.5 mm central screw and four peripheral locking screws. The permanent 36 mm glenosphere was then impacted into place and its Morse taper locking mechanism verified using manual distraction.  Attention was directed to the humeral side. The humeral canal was reamed sequentially beginning with the end-cutting reamer then progressing from a 4 mm reamer up to an 11 mm reamer. This provided excellent circumferential chatter. The canal was broached beginning with a #8 broach and progressing to a #11  broach. This was left in place and a trial reduction performed using the standard trial humeral platform. The arm  demonstrated excellent range of motion as the hand could be brought across the chest to the opposite shoulder and brought to the top of the patient's head and to the patient's ear. The shoulder appeared stable throughout this range of motion. The joint was dislocated and the trial components removed. The permanent #11 micro-stem was impacted into place with care taken to maintain the appropriate version. The permanent 40 mm humeral platform with the standard insert was put together on the back table and impacted into place. Again, the [facility] taper locking mechanism was verified using manual distraction. The shoulder was relocated using two finger pressure and again placed through a range of motion with the findings as described above.  The wound was copiously irrigated with sterile saline solution using the jet lavage system before a total of 30 cc of 0.5% Sensorcaine with epinephrine was injected into the pericapsular and peri-incisional tissues to help with postoperative analgesia. The subscapularis tendon was reapproximated using #2 FiberWire interrupted sutures. The deltopectoral interval was closed using #0 Vicryl interrupted sutures before the subcutaneous tissues were closed using 2-0 Vicryl interrupted sutures. The skin was closed using staples. Prior to closing the skin, 1 g of transexemic acid in 10 cc of normal saline was injected intra-articularly to help with postoperative bleeding. A sterile occlusive dressing was applied to the wound before the arm was placed into a shoulder immobilizer with an abduction pillow. A Polar Care system also was applied to the shoulder. The patient was then transferred back to a hospital bed before being awakened, extubated, and returned to the recovery room in satisfactory condition after tolerating the procedure well.

## 2020-12-03 NOTE — Discharge Summary (Signed)
Physician Discharge Summary  Patient ID: Suzanne Dixon MRN: [id number] DOB/AGE: September 23, 1950 70 y.o.  Admit date: 12/03/2020 Discharge date: 12/04/2020  Admission Diagnoses:  Status post reverse arthroplasty of shoulder, right [Z96.611]  Discharge Diagnoses: Patient Active Problem List   Diagnosis Date Noted  . Status post reverse arthroplasty of shoulder, 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  . Lymphedema 07/17/2020  . Right rotator cuff tear arthropathy 04/07/2020  . Edema of both legs 12/31/2019  . 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 Swelling of limb 12/31/2019  . Hematuria, microscopic 06/07/2018  . Age-related osteoporosis without current pathological fracture 12/08/2017  . Tendinitis of right rotator cuff 01/03/2017  . Biceps tendinitis of right upper extremity 12/05/2016  . Complete tear of right rotator cuff 12/05/2016  . 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 History of breast cancer in female 06/02/2016  . Allergic state 04/01/2015  . Arthritis 04/01/2015  . Cancer ([place]) 04/01/2015  . Edema leg 04/01/2015  . Gout 04/01/2015  . Hidradenitis 04/01/2015  . Elevated lymphocyte count 04/01/2015  . Disorder of peripheral nervous system 04/01/2015  . Apnea, sleep 04/01/2015  . Avitaminosis D 04/01/2015  . Breast cancer ([place]) 04/01/2015  . BP (high blood pressure) 04/15/2014  . B12 deficiency 02/24/2014  . A-fib ([place]) 02/17/2014  . Adiposity 06/04/2012  . Depression, major, recurrent, moderate ([place]) 03/26/2012    Past Medical History:  Diagnosis Date  . A-fib ([place])   . Anemia   . B12 deficiency   . Breast cancer ([place]) 03/31/2013   Right - chemo- mastectomy  . Breast cancer ([place]) 1991   Rt.- radiation  . Complication of anesthesia 01/2009   Recent years with general anesthesia had itching following surgery  . DDD (degenerative disc disease), cervical   . Depression   .  Diverticulitis   . Dyspnea   . Edema of both legs   . GERD (gastroesophageal reflux disease)   . Gout   . H/O mastectomy 2014   per patient  . H/O: cesarean section 1987   per patient report  . H/O: hysterectomy 1996   per patient  . Hydradenitis   . Hypertension   . Hypothyroidism   . Neuropathy   . 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 Parkinson's disease ([place])   . Personal history of chemotherapy   . Personal history of radiation therapy   . Ruptured cervical disc 2002   per patient   . Sleep apnea    cpap machine  . Tachycardia      Transfusion: None.   Consultants (if any):   Discharged Condition: Improved  Hospital Course: ALMARIE KURDZIEL is an 70 y.o. female who was admitted 12/03/2020 with a diagnosis of a massive irreparable recurrent rotator cuff tear with rotator cuff arthropathy of the right shoulder and went to the operating room on 12/03/2020 and underwent the above named procedures.    Surgeries: Procedure(s): REVERSE SHOULDER ARTHROPLASTY on 12/03/2020 Patient tolerated the surgery well. Taken to PACU where she was stabilized and then transferred to the orthopedic floor.  Continued on warfarin while she was admitted to the hospital. Foot pumps applied bilaterally at 80 mm. Heels elevated on bed with rolled towels. No evidence of DVT. Negative [place]. Physical therapy started on day #1 for gait training and transfer. OT started day #1 for ADL and assisted devices.  Patient's IV was removed on POD1.  Implants: All press-fit Biomet Comprehensive system with a #  11 micro-humeral stem, a 40 mm humeral tray with a standard insert, and a mini-base plate with a 36 mm glenosphere.  She was given perioperative antibiotics:  Anti-infectives (From admission, onward)   Start     Dose/Rate Route Frequency Ordered Stop   12/03/20 1400  ceFAZolin (ANCEF) 3 g in dextrose 5 % 50 mL IVPB        3 g 100 mL/hr over 30 Minutes Intravenous Every 6 hours 12/03/20 1152 12/04/20 0227    12/03/20 0600  ceFAZolin (ANCEF) 3 g in dextrose 5 % 50 mL IVPB        3 g 100 mL/hr over 30 Minutes Intravenous On call to O.R. 12/02/20 2308 12/03/20 0757    .  She was given sequential compression devices, early ambulation, and warfarin for DVT prophylaxis.  She benefited maximally from the hospital stay and there were no complications.    Recent vital signs:  Vitals:   12/04/20 0748 12/04/20 1209  BP: (!) 129/97 139/79  Pulse: 74 (!) 59  Resp: 16 17  Temp: 97.8 F (36.6 C) (!) 97.5 F (36.4 C)  SpO2: 99% 92%    Recent laboratory studies:  Lab Results  Component Value Date   HGB 13.4 12/04/2020   HGB 13.7 11/26/2020   HGB 13.4 12/20/2019   Lab Results  Component Value Date   WBC 15.0 (H) 12/04/2020   PLT 259 12/04/2020   Lab Results  Component Value Date   INR 1.3 (H) 12/04/2020   Lab Results  Component Value Date   NA 140 12/04/2020   K 4.3 12/04/2020   CL 105 12/04/2020   CO2 28 12/04/2020   BUN 11 12/04/2020   CREATININE 0.63 12/04/2020   GLUCOSE 112 (H) 12/04/2020    Discharge Medications:   Allergies as of 12/04/2020      Reactions   Advair Diskus [fluticasone-salmeterol] Other (See Comments)   Joint pain    Alendronate    Muscle pain   Singulair [montelukast] Other (See Comments)   "muscle pain"   Sulfa Antibiotics Hives   Venlafaxine Other (See Comments)   "altered mental status/tremors"      Medication List    TAKE these medications   allopurinol 300 MG tablet Commonly known as: ZYLOPRIM Take 300 mg by mouth daily.   Calcium Carb-Cholecalciferol 600-400 MG-UNIT Tabs Take 1 tablet by mouth 2 (two) times daily with a meal.   carbidopa-levodopa 25-100 MG tablet Commonly known as: SINEMET IR Take 1.5 tablets by mouth 3 (three) times daily.   DULoxetine 60 MG capsule Commonly known as: CYMBALTA Take 60 mg by mouth 2 (two) times daily.   fluticasone 50 MCG/ACT nasal spray Commonly known as: FLONASE Place 1 spray into both nostrils  daily as needed for allergies.   gabapentin 600 MG tablet Commonly known as: NEURONTIN Take 600 mg by mouth 4 (four) times daily.   HYDROcodone-acetaminophen 5-325 MG tablet Commonly known as: NORCO/VICODIN Take 1-2 tablets by mouth every 4 (four) hours as needed for moderate pain (pain score 4-6).   levothyroxine 50 MCG tablet Commonly known as: SYNTHROID Take 50 mcg by mouth daily before breakfast.   Magnesium 400 MG Caps Take 400 mg by mouth daily.   meloxicam 15 MG tablet Commonly known as: MOBIC Take 15 mg by mouth daily.   metoprolol tartrate 100 MG tablet Commonly known as: LOPRESSOR Take 200 mg by mouth 2 (two) times daily.   Multiple Vitamins tablet Take 2 tablets by mouth daily.  omeprazole 20 MG capsule Commonly known as: PRILOSEC Take 20 mg by mouth daily.   oxybutynin 5 MG tablet Commonly known as: DITROPAN Take 5 mg by mouth 2 (two) times daily.   potassium chloride 10 MEQ tablet Commonly known as: KLOR-CON Take 10 mEq by mouth 3 (three) times daily.   PSORIASIS/ECZEMA RELIEF EX Apply 1 application topically daily as needed (eczema).   traMADol 50 MG tablet Commonly known as: ULTRAM Take 1 tablet (50 mg total) by mouth every 6 (six) hours as needed for moderate pain.   triamcinolone 0.025 % cream Commonly known as: KENALOG Apply 1 application topically 2 (two) times daily.   vitamin B-12 1000 MCG tablet Commonly known as: CYANOCOBALAMIN Take 1,000 mcg by mouth daily.   warfarin 6 MG tablet Commonly known as: COUMADIN Take 6 mg by mouth at bedtime. Take with 1 mg to equal 7 mg at night   warfarin 1 MG tablet Commonly known as: COUMADIN Take 1 mg by mouth at bedtime. Take with 6 mg to equal 7 mg at night       Diagnostic Studies: DG Shoulder Right Port  Result Date: 12/03/2020 CLINICAL DATA:  Patient status post right shoulder replacement today. EXAM: PORTABLE RIGHT SHOULDER COMPARISON:  CT right shoulder 07/22/2020. FINDINGS: Reverse  shoulder arthroplasty is in place. No fracture or dislocation. Gas in the soft tissues and surgical staples noted. IMPRESSION: Status post reverse right shoulder arthroplasty. No acute abnormality. Electronically Signed   By: Inge Rise M.D.   On: 12/03/2020 11:54   [place] OR NERVE BLOCK-IMAGE ONLY [facility])  Result Date: 12/03/2020 There is no interpretation for this exam.  This order is for images obtained during a surgical procedure.  Please See "Surgeries" Tab for more information regarding the procedure.   Disposition: Plan for discharge home with HHPT on 12/04/20.  HHPT to perform routine PT/INR checks while patient is home until PT/INR is therapeutic.   Follow-up Information    Lattie Corns, PA-C Follow up in 14 day(s).   Specialty: Physician Assistant Why: Electa Sniff information: [place] [address] [phone]             Signed: Judson Roch PA-C 12/04/2020, 12:10 PM

## 2020-12-04 ENCOUNTER — Encounter: Payer: Self-pay | Admitting: Surgery

## 2020-12-04 LAB — CBC
HCT: 38 % (ref 36.0–46.0)
Hemoglobin: 13.4 g/dL (ref 12.0–15.0)
MCH: 32.2 pg (ref 26.0–34.0)
MCHC: 35.3 g/dL (ref 30.0–36.0)
MCV: 91.3 fL (ref 80.0–100.0)
Platelets: 259 10*3/uL (ref 150–400)
RBC: 4.16 MIL/uL (ref 3.87–5.11)
RDW: 13.2 % (ref 11.5–15.5)
WBC: 15 10*3/uL — ABNORMAL HIGH (ref 4.0–10.5)
nRBC: 0 % (ref 0.0–0.2)

## 2020-12-04 LAB — PROTIME-INR
INR: 1.3 — ABNORMAL HIGH (ref 0.8–1.2)
Prothrombin Time: 15.3 seconds — ABNORMAL HIGH (ref 11.4–15.2)

## 2020-12-04 LAB — BASIC METABOLIC PANEL
Anion gap: 7 (ref 5–15)
BUN: 11 mg/dL (ref 8–23)
CO2: 28 mmol/L (ref 22–32)
Calcium: 8.9 mg/dL (ref 8.9–10.3)
Chloride: 105 mmol/L (ref 98–111)
Creatinine, Ser: 0.63 mg/dL (ref 0.44–1.00)
GFR, Estimated: >60 mL/min (ref >60)
Glucose, Bld: 112 mg/dL — ABNORMAL HIGH (ref 70–99)
Potassium: 4.3 mmol/L (ref 3.5–5.1)
Sodium: 140 mmol/L (ref 135–145)

## 2020-12-04 MED ORDER — TRAMADOL HCL 50 MG PO TABS: 50.0000 mg | ORAL_TABLET | Freq: Four times a day (QID) | ORAL | 0 refills | Status: DC | PRN

## 2020-12-04 MED ORDER — WARFARIN SODIUM 10 MG PO TABS
10.0000 mg | ORAL_TABLET | Freq: Once | ORAL | Status: AC
  Administered 2020-12-04: 10 mg via ORAL
  Filled 2020-12-04: qty 1

## 2020-12-04 MED ORDER — HYDROCODONE-ACETAMINOPHEN 5-325 MG PO TABS: 1.0000 | ORAL_TABLET | ORAL | 0 refills | Status: DC | PRN

## 2020-12-04 NOTE — Discharge Instructions (Signed)
Diet: As you were doing prior to hospitalization  ° °Shower:  May shower but keep the wounds dry, use an occlusive plastic wrap, NO SOAKING IN TUB.  If the bandage gets wet, change with a clean dry gauze. ° °Dressing:  You may change your dressing as needed. Change the dressing with sterile gauze dressing.   ° °Activity:  Increase activity slowly as tolerated, but follow the weight bearing instructions below.  No lifting or driving for 6 weeks. ° °Weight Bearing:   Non-weightbearing to the left arm. ° °To prevent constipation: you may use a stool softener such as - ° °Colace (over the counter) 100 mg by mouth twice a day  °Drink plenty of fluids (prune juice may be helpful) and high fiber foods °Miralax (over the counter) for constipation as needed.   ° °Itching:  If you experience itching with your medications, try taking only a single pain pill, or even half a pain pill at a time.  You may take up to 10 pain pills per day, and you can also use benadryl over the counter for itching or also to help with sleep.  ° °Precautions:  If you experience chest pain or shortness of breath - call 911 immediately for transfer to the hospital emergency department!! ° °If you develop a fever greater that 101 F, purulent drainage from wound, increased redness or drainage from wound, or calf pain-Call [facility]                                              °Follow- Up Appointment:  Please call for an appointment to be seen in 2 weeks at [facility] °

## 2020-12-04 NOTE — Progress Notes (Signed)
Subjective: 1 Day Post-Op Procedure(s) (LRB): REVERSE SHOULDER ARTHROPLASTY (Right) Patient reports no pain in the right shoulder this AM.  Block still in effect. Patient is well, and has had no acute complaints or problems Plan is to go Home after hospital stay. Negative for chest pain and shortness of breath Fever: no Gastrointestinal:Negative for nausea and vomiting  Objective: Vital signs in last 24 hours: Temp:  [96.8 F (36 C)-98.3 F (36.8 C)] 97.6 F (36.4 C) (03/18 0458) Pulse Rate:  [63-83] 68 (03/18 0458) Resp:  [13-21] 18 (03/18 0458) BP: (132-174)/(82-105) 148/93 (03/18 0458) SpO2:  [68 %-100 %] 100 % (03/18 0458)  Intake/Output from previous day:  Intake/Output Summary (Last 24 hours) at 12/04/2020 0720 Last data filed at 12/04/2020 0502 Gross per 24 hour  Intake 1200 ml  Output 100 ml  Net 1100 ml    Intake/Output this shift: No intake/output data recorded.  Labs: Recent Labs    12/04/20 0427  HGB 13.4   Recent Labs    12/04/20 0427  WBC 15.0*  RBC 4.16  HCT 38.0  PLT 259   Recent Labs    12/04/20 0427  NA 140  K 4.3  CL 105  CO2 28  BUN 11  CREATININE 0.63  GLUCOSE 112*  CALCIUM 8.9   Recent Labs    12/03/20 0629 12/04/20 0427  INR 1.3* 1.3*     EXAM General - Patient is Alert, Appropriate and Oriented Extremity - Intact pulses distally Incision: dressing C/D/I No cellulitis present  Decrease sensation to light touch to the right arm this AM. Sling intact to the right upper extremity. Dressing/Incision - clean, dry, no drainage Motor Function - intact, moving foot and toes well on exam.  Abdomen soft with normal bowel sounds.  Past Medical History:  Diagnosis Date  . A-fib ([place])   . Anemia   . B12 deficiency   . Breast cancer ([place]) 03/31/2013   Right - chemo- mastectomy  . Breast cancer ([place]) 1991   Rt.- radiation  . Complication of anesthesia 01/2009   Recent years with general anesthesia had itching following  surgery  . DDD (degenerative disc disease), cervical   . Depression   . Diverticulitis   . Dyspnea   . Edema of both legs   . GERD (gastroesophageal reflux disease)   . Gout   . H/O mastectomy 2014   per patient  . H/O: cesarean section 1987   per patient report  . H/O: hysterectomy 1996   per patient  . Hydradenitis   . Hypertension   . Hypothyroidism   . Neuropathy   . 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 Parkinson's disease ([place])   . Personal history of chemotherapy   . Personal history of radiation therapy   . Ruptured cervical disc 2002   per patient   . Sleep apnea    cpap machine  . Tachycardia    Assessment/Plan: 1 Day Post-Op Procedure(s) (LRB): REVERSE SHOULDER ARTHROPLASTY (Right) Active Problems:   Status post reverse arthroplasty of shoulder, right  Estimated body mass index is 49.26 kg/m as calculated from the following:   Height as of 11/26/20: 5\' 4"  (1.626 m).   Weight as of 11/26/20: 130.2 kg. Advance diet Up with therapy D/C IV fluids when tolerating po intake.  Labs reviewed this AM. Hg 13.4, WBC 15. INR 1.3 this AM, continue Warfarin this AM. Patient is passing gas, work on BM. Up with therapy today. Will plan for discharge home today  pending progress with PT.   HHPT to check PT/INR.  DVT Prophylaxis - Lovenox, Foot Pumps and TED hose Non-weightbearing to the right arm.  Raquel Deborh Pense, PA-C [facility] 12/04/2020, 7:20 AM

## 2020-12-04 NOTE — Progress Notes (Signed)
Physical Therapy Treatment Patient Details Name: Suzanne Dixon MRN: [id number] DOB: 10-30-1950 Today's Date: 12/04/2020    History of Present Illness Pt is a 70 yo female s/p R reverse TSA. PMH of sleep apnea, HTN, afib, breast cancer.    PT Comments    Uses rail to get OOB but no other assist.  Steady in sitting.  Stands with supervision and is able to march in place.  She progresses gait to 100' with SPC and min guard.  Increased lateral sway but is able to self recover slight imbalances.  Discussed safety and +1 assist initially at home.  She has no stairs to enter her home.  No further questions or concerns voiced.     Follow Up Recommendations  Home health PT;Supervision/Assistance - 24 hour     Equipment Recommendations  None recommended by PT    Recommendations for Other Services       Precautions / Restrictions Precautions Precautions: Fall;Shoulder Shoulder Interventions: Shoulder sling/immobilizer;Shoulder abduction pillow;At all times;Off for dressing/bathing/exercises Restrictions Weight Bearing Restrictions: Yes RUE Weight Bearing: Non weight bearing    Mobility  Bed Mobility Overal bed mobility: Needs Assistance Bed Mobility: Supine to Sit     Supine to sit: Modified independent (Device/Increase time)          Transfers Overall transfer level: Needs assistance Equipment used: Quad cane Transfers: Sit to/from Stand Sit to Stand: Min guard            Ambulation/Gait Ambulation/Gait assistance: Counsellor (Feet): 100 Feet Assistive device: Quad cane Gait Pattern/deviations: WFL(Within Functional Limits);Wide base of support Gait velocity: decreased   General Gait Details: increased lateral sway but no LOB noted.   Stairs             Wheelchair Mobility    Modified Rankin (Stroke Patients Only)       Balance Overall balance assessment: Needs assistance Sitting-balance support: Feet supported Sitting balance-Leahy  Scale: Good     Standing balance support: Single extremity supported Standing balance-Leahy Scale: Fair Standing balance comment: use of quad cane for ambulation                            Cognition Arousal/Alertness: Awake/alert Behavior During Therapy: WFL for tasks assessed/performed Overall Cognitive Status: Within Functional Limits for tasks assessed                                        Exercises      General Comments        Pertinent Vitals/Pain Pain Assessment: No/denies pain    Home Living                      Prior Function            PT Goals (current goals can now be found in the care plan section) Progress towards PT goals: Progressing toward goals    Frequency    7X/week      PT Plan      Co-evaluation              AM-PAC PT "6 Clicks" Mobility   Outcome Measure  Help needed turning from your back to your side while in a flat bed without using bedrails?: None Help needed moving from lying on your back to sitting on the side of  a flat bed without using bedrails?: A Little Help needed moving to and from a bed to a chair (including a wheelchair)?: A Little Help needed standing up from a chair using your arms (e.g., wheelchair or bedside chair)?: A Little Help needed to walk in hospital room?: A Little Help needed climbing 3-5 steps with a railing? : A Little 6 Click Score: 19    End of Session   Activity Tolerance: Patient tolerated treatment well;Other (comment);Treatment limited secondary to medical complications (Comment) (limited by elevated BP) Patient left: in chair;with chair alarm set;with call bell/phone within reach Nurse Communication: Mobility status PT Visit Diagnosis: Other abnormalities of gait and mobility (R26.89);Muscle weakness (generalized) (M62.81);Pain Pain - Right/Left: Right Pain - part of body: Shoulder     Time: 1610-9604 PT Time Calculation (min) (ACUTE ONLY): 14  min  Charges:  $Gait Training: 8-22 mins                    Chesley Noon, PTA 12/04/20, 9:15 AM

## 2020-12-04 NOTE — Evaluation (Signed)
Occupational Therapy Evaluation Patient Details Name: Suzanne Dixon MRN: [id number] DOB: [birth year] Today's Date: 12/04/2020    History of Present Illness Pt is a 70 yo female s/p R reverse TSA. PMH of sleep apnea, HTN, afib, breast cancer.   Clinical Impression   Patient was seen for an OT evaluation this date. Pt lives alone but reports daughter (who is present for evaluation) will be staying with her for 2 weeks. Prior to surgery, pt was active and independent. Pt has orders for RUE to be immobilized and will be NWBing per MD. Patient presents with impaired strength/ROM, pain, and sensation to RUE with block not completely resolved yet. These impairments result in a decreased ability to perform self care tasks requiring MIN assist for LB dressing, MOD A UB dressing and bathing and max assist for application of polar care, compression stockings, and sling/immobilizer. Pt/dtr instructed in polar care mgt, compression stockings mgt, sling/immobilizer mgt, ROM precautions, adaptive strategies for bathing/dressing/toileting/grooming, positioning and considerations for sleep, and home/routines modifications to maximize falls prevention, safety, and independence. Handout provided. OT adjusted sling/immobilizer and polar care to improve comfort, optimize positioning, and to maximize skin integrity/safety. Pt verbalized understanding of all education/training provided. Pt will benefit from skilled OT services to address these limitations and improve independence in daily tasks. Recommend HHOT services to continue therapy to maximize return to PLOF, address home/routines modifications and safety, minimize falls risk, and minimize caregiver burden. Also recommend HH Aide to support bathing.     Follow Up Recommendations  Home health OT;Supervision - Intermittent    Equipment Recommendations  None recommended by OT    Recommendations for Other Services       Precautions / Restrictions  Precautions Precautions: Fall;Shoulder Shoulder Interventions: Shoulder sling/immobilizer;Shoulder abduction pillow;At all times;Off for dressing/bathing/exercises Precaution Booklet Issued: Yes (comment) Restrictions Weight Bearing Restrictions: Yes RUE Weight Bearing: Non weight bearing      Mobility Bed Mobility Overal bed mobility: Needs Assistance Bed Mobility: Supine to Sit     Supine to sit: Modified independent (Device/Increase time)     General bed mobility comments: deferred, up in recliner    Transfers Overall transfer level: Needs assistance Equipment used: Quad cane Transfers: Sit to/from Stand Sit to Stand: Min guard              Balance Overall balance assessment: Needs assistance Sitting-balance support: Feet supported Sitting balance-Leahy Scale: Good     Standing balance support: Single extremity supported;No upper extremity supported;During functional activity Standing balance-Leahy Scale: Fair Standing balance comment: use of quad cane for ambulation                           ADL either performed or assessed with clinical judgement   ADL Overall ADL's : Needs assistance/impaired                                       General ADL Comments: MIN A for LB ADL to complete over R hip and for socks; MOD A for UB ADL, cues for RUE NWBing/no AROM shoulder, SBA for ADL transfers; MAX A for compression stockings, polar care, sling     Vision Patient Visual Report: No change from baseline       Perception     Praxis      Pertinent Vitals/Pain Pain Assessment: No/denies pain     Hand Dominance  Left   Extremity/Trunk Assessment Upper Extremity Assessment RUE Deficits / Details: s/p reverse TSA, some decreased sensation still, fair grip RUE Sensation: decreased light touch;decreased proprioception RUE Coordination: decreased fine motor;decreased gross motor   Lower Extremity Assessment Lower Extremity Assessment:  Overall WFL for tasks assessed   Cervical / Trunk Assessment Cervical / Trunk Assessment: Normal   Communication Communication Communication: No difficulties   Cognition Arousal/Alertness: Awake/alert Behavior During Therapy: WFL for tasks assessed/performed Overall Cognitive Status: Within Functional Limits for tasks assessed                                     General Comments       Exercises Other Exercises Other Exercises: Pt/dtr instructed in shoulder d/c sheet, home/routines mods, falls, shoulder sling/immobilizer, polar care, compression stockings, precautions and how to maintain, AE/DME, self care skills   Shoulder Instructions      Home Living Family/patient expects to be discharged to:: Private residence Living Arrangements: Alone Available Help at Discharge: Family;Other (Comment);Available 24 hours/day (daughter will be staying with her for two weeks) Type of Home: House Home Access: Ramped entrance     Home Layout: One level     Bathroom Shower/Tub: Occupational psychologist: Handicapped height     Home Equipment: Grab bars - toilet;Grab bars - tub/shower;Bedside commode;Walker - 2 wheels;Walker - 4 wheels;Cane - quad          Prior Functioning/Environment Level of Independence: Independent with assistive device(s)        Comments: Pt using small base cane for mobility, indep with ADL, housekeeping 2x/wk, pt reports some difficulty with heavy meal prep 2/2 fatigue, endorses some difficulty with med mgt after health insurance switch no longer provided her with blister packs        OT Problem List: Decreased activity tolerance;Decreased range of motion;Decreased strength;Decreased coordination;Impaired sensation;Obesity;Decreased knowledge of use of DME or AE;Impaired balance (sitting and/or standing);Impaired UE functional use;Decreased knowledge of precautions      OT Treatment/Interventions: Self-care/ADL training;Therapeutic  exercise;Therapeutic activities;DME and/or AE instruction;Patient/family education;Energy conservation;Balance training    OT Goals(Current goals can be found in the care plan section) Acute Rehab OT Goals Patient Stated Goal: to go home, return to PLOF OT Goal Formulation: With patient/family Time For Goal Achievement: 12/18/20 Potential to Achieve Goals: Good ADL Goals Pt Will Perform Upper Body Dressing: with caregiver independent in assisting;sitting Pt Will Perform Lower Body Dressing: with caregiver independent in assisting;sit to/from stand Additional ADL Goal #1: Pt will independently instruct family/caregiver in polar care mgt Additional ADL Goal #2: Pt will independently instruct family/caregiver in shoulder sling/immobilizer mgt Additional ADL Goal #3: Pt will independently instruct family/caregiver in compression stocking mgt  OT Frequency: Min 2X/week   Barriers to D/C:            Co-evaluation              AM-PAC OT "6 Clicks" Daily Activity     Outcome Measure Help from another person eating meals?: A Little Help from another person taking care of personal grooming?: A Little Help from another person toileting, which includes using toliet, bedpan, or urinal?: A Little Help from another person bathing (including washing, rinsing, drying)?: A Lot Help from another person to put on and taking off regular upper body clothing?: A Lot Help from another person to put on and taking off regular lower body clothing?: A Little  6 Click Score: 16   End of Session    Activity Tolerance: Patient tolerated treatment well Patient left: in chair;with call bell/phone within reach;with chair alarm set;with family/visitor present;Other (comment) (polar care,sling in place)  OT Visit Diagnosis: Other abnormalities of gait and mobility (R26.89);History of falling (Z91.81)                Time: 6314-9702 OT Time Calculation (min): 71 min Charges:  OT General Charges $OT Visit: 1  Visit OT Evaluation $OT Eval Moderate Complexity: 1 Mod OT Treatments $Self Care/Home Management : 53-67 mins  Hanley Hays, MPH, MS, OTR/L ascom [phone] 12/04/20, 12:36 PM

## 2020-12-04 NOTE — Progress Notes (Signed)
Pt provided discharge instructions and daughter is at bedside. All questions answered. Pt discharged in wheelchair.     12/04/20 1209  Vitals  Temp (!) 97.5 F (36.4 C)  BP 139/79  MAP (mmHg) 97  BP Location Left Arm  BP Method Automatic  Patient Position (if appropriate) Sitting  Pulse Rate (!) 59  Resp 17  MEWS COLOR  MEWS Score Color Green  Oxygen Therapy  SpO2 92 %  O2 Device Room [employer]

## 2020-12-04 NOTE — Progress Notes (Signed)
Patient discharging home today with [facility] for PT, [facility] will also be checking PT/INR at home, Spoke with Helene Kelp with [facility] and they are aware of the DC The patient 's daughter will be staying with her for 2 weeks No additional needs

## 2020-12-07 LAB — SURGICAL PATHOLOGY

## 2020-12-10 DIAGNOSIS — D6869 Other thrombophilia: Secondary | ICD-10-CM | POA: Insufficient documentation

## 2021-01-15 ENCOUNTER — Ambulatory Visit (INDEPENDENT_AMBULATORY_CARE_PROVIDER_SITE_OTHER): Payer: Medicare HMO | Admitting: Nurse Practitioner

## 2021-01-19 ENCOUNTER — Emergency Department: Payer: Medicare HMO

## 2021-01-19 ENCOUNTER — Emergency Department
Admission: EM | Admit: 2021-01-19 | Discharge: 2021-01-19 | Disposition: A | Discharge status: Home / Self Care | Payer: Medicare HMO | Attending: Student in an Organized Health Care Education/Training Program | Admitting: Student in an Organized Health Care Education/Training Program

## 2021-01-19 ENCOUNTER — Other Ambulatory Visit: Payer: Self-pay

## 2021-01-19 DIAGNOSIS — R600 Localized edema: Secondary | ICD-10-CM | POA: Diagnosis not present

## 2021-01-19 DIAGNOSIS — E039 Hypothyroidism, unspecified: Secondary | ICD-10-CM | POA: Diagnosis not present

## 2021-01-19 DIAGNOSIS — Z96611 Presence of right artificial shoulder joint: Secondary | ICD-10-CM | POA: Insufficient documentation

## 2021-01-19 DIAGNOSIS — G20 Parkinson's disease: Secondary | ICD-10-CM | POA: Insufficient documentation

## 2021-01-19 DIAGNOSIS — Z79899 Other long term (current) drug therapy: Secondary | ICD-10-CM | POA: Diagnosis not present

## 2021-01-19 DIAGNOSIS — I10 Essential (primary) hypertension: Secondary | ICD-10-CM | POA: Insufficient documentation

## 2021-01-19 DIAGNOSIS — Z7901 Long term (current) use of anticoagulants: Secondary | ICD-10-CM | POA: Insufficient documentation

## 2021-01-19 DIAGNOSIS — R609 Edema, unspecified: Secondary | ICD-10-CM

## 2021-01-19 DIAGNOSIS — Z853 Personal history of malignant neoplasm of breast: Secondary | ICD-10-CM | POA: Insufficient documentation

## 2021-01-19 DIAGNOSIS — R2243 Localized swelling, mass and lump, lower limb, bilateral: Secondary | ICD-10-CM | POA: Diagnosis present

## 2021-01-19 LAB — HEPATIC FUNCTION PANEL
ALT: 7 U/L (ref 0–44)
AST: 29 U/L (ref 15–41)
Albumin: 3.7 g/dL (ref 3.5–5.0)
Alkaline Phosphatase: 79 U/L (ref 38–126)
Bilirubin, Direct: <0.1 mg/dL (ref 0.0–0.2)
Total Bilirubin: 0.6 mg/dL (ref 0.3–1.2)
Total Protein: 7 g/dL (ref 6.5–8.1)

## 2021-01-19 LAB — BASIC METABOLIC PANEL
Anion gap: 8 (ref 5–15)
BUN: 14 mg/dL (ref 8–23)
CO2: 25 mmol/L (ref 22–32)
Calcium: 8.7 mg/dL — ABNORMAL LOW (ref 8.9–10.3)
Chloride: 104 mmol/L (ref 98–111)
Creatinine, Ser: 0.8 mg/dL (ref 0.44–1.00)
GFR, Estimated: >60 mL/min (ref >60)
Glucose, Bld: 95 mg/dL (ref 70–99)
Potassium: 4.4 mmol/L (ref 3.5–5.1)
Sodium: 137 mmol/L (ref 135–145)

## 2021-01-19 LAB — CBC
HCT: 38.6 % (ref 36.0–46.0)
Hemoglobin: 13.3 g/dL (ref 12.0–15.0)
MCH: 31.5 pg (ref 26.0–34.0)
MCHC: 34.5 g/dL (ref 30.0–36.0)
MCV: 91.5 fL (ref 80.0–100.0)
Platelets: 256 10*3/uL (ref 150–400)
RBC: 4.22 MIL/uL (ref 3.87–5.11)
RDW: 13.5 % (ref 11.5–15.5)
WBC: 6.4 10*3/uL (ref 4.0–10.5)
nRBC: 0 % (ref 0.0–0.2)

## 2021-01-19 LAB — URINALYSIS, COMPLETE (UACMP) WITH MICROSCOPIC
Bilirubin Urine: NEGATIVE
Glucose, UA: NEGATIVE mg/dL
Ketones, ur: NEGATIVE mg/dL
Nitrite: NEGATIVE
Protein, ur: NEGATIVE mg/dL
Specific Gravity, Urine: 1.009 (ref 1.005–1.030)
pH: 7 (ref 5.0–8.0)

## 2021-01-19 LAB — BRAIN NATRIURETIC PEPTIDE: B Natriuretic Peptide: 164.8 pg/mL — ABNORMAL HIGH (ref 0.0–100.0)

## 2021-01-19 MED ORDER — FUROSEMIDE 20 MG PO TABS: 20.0000 mg | ORAL_TABLET | Freq: Every day | ORAL | 0 refills | Status: DC

## 2021-01-19 MED ORDER — NITROFURANTOIN MONOHYD MACRO 100 MG PO CAPS: 100.0000 mg | ORAL_CAPSULE | Freq: Two times a day (BID) | ORAL | 0 refills | Status: AC

## 2021-01-19 NOTE — ED Triage Notes (Signed)
Pt comes with c/o bilateral lower leg swelling. Pt states this started few weeks ago.  Pt denies any CP or SOB. Pt does state decreased intake.

## 2021-01-19 NOTE — ED Provider Notes (Signed)
[facility] Emergency Department Provider Note    Event Date/Time   First MD Initiated Contact with Patient 01/19/21 1346     (approximate)  I have reviewed the triage vital signs and the nursing notes.   HISTORY  Chief Complaint Leg Swelling    HPI Suzanne Dixon is a 70 y.o. female below listed past medical history presents to the ER for evaluation of bilateral lower extremity swelling for the past several weeks.  Has noted some achiness and discomfort in her right leg.  No history of DVT or PE.  No recent medication changes.  Does have a history of high blood pressure.  No previous history of CHF or ACS.  Does have family history of cardiac disease.  Denies any orthopnea or chest pain.    Past Medical History:  Diagnosis Date  . A-fib ([place])   . Anemia   . B12 deficiency   . Breast cancer ([place]) 03/31/2013   Right - chemo- mastectomy  . Breast cancer ([place]) 1991   Rt.- radiation  . Complication of anesthesia 01/2009   Recent years with general anesthesia had itching following surgery  . DDD (degenerative disc disease), cervical   . Depression   . Diverticulitis   . Dyspnea   . Edema of both legs   . GERD (gastroesophageal reflux disease)   . Gout   . H/O mastectomy 2014   per patient  . H/O: cesarean section 1987   per patient report  . H/O: hysterectomy 1996   per patient  . Hydradenitis   . Hypertension   . Hypothyroidism   . Neuropathy   . 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 Parkinson's disease ([place])   . Personal history of chemotherapy   . Personal history of radiation therapy   . Ruptured cervical disc 2002   per patient   . Sleep apnea    cpap machine  . Tachycardia    Family History  Problem Relation Age of Onset  . Hypertension Mother   . Lymphoma Mother   . Cancer Sister        Breast  . Breast cancer Sister 47   Past Surgical History:  Procedure Laterality Date  . ABDOMINAL HYSTERECTOMY    . BACK SURGERY  2002    plate and 2 screws in neck  . BREAST BIOPSY Right 1991,2014   Positive  . BREAST CYST ASPIRATION Left   . BREAST SURGERY Right 2014   mastectomy  . CATARACT EXTRACTION W/PHACO Left 05/17/2019   Procedure: CATARACT EXTRACTION PHACO AND INTRAOCULAR LENS PLACEMENT (IOC);  Surgeon: Eulogio Bear, MD;  Location: [facility] ORS;  Service: Ophthalmology;  Laterality: Left;  [place] 00:28 CDE 2.15 FLUID PACK LOT # [id number]  . CATARACT EXTRACTION W/PHACO Right 06/14/2019   Procedure: CATARACT EXTRACTION PHACO AND INTRAOCULAR LENS PLACEMENT ([place]) RIGHT;  Surgeon: Eulogio Bear, MD;  Location: [facility] ORS;  Service: Ophthalmology;  Laterality: Right;  [place] 00:35.0 CDE 2.37 Fluid Pack Lot #[id number]  . CESAREAN SECTION    . CHOLECYSTECTOMY  04/09/2012  . COLONOSCOPY WITH PROPOFOL N/A 06/22/2015   Procedure: COLONOSCOPY WITH PROPOFOL;  Surgeon: Manya Silvas, MD;  Location: [facility] ENDOSCOPY;  Service: Endoscopy;  Laterality: N/A;  . ESOPHAGOGASTRODUODENOSCOPY    . EYE SURGERY    . LAPAROSCOPIC GASTRIC BYPASS    . MASTECTOMY Right   . NEUROPLASTY / TRANSPOSITION ULNAR NERVE AT ELBOW    . PORT-A-CATH REMOVAL Left 05/13/2015   Procedure: REMOVAL  PORT-A-CATH LEFT CHEST ;  Surgeon: Florene Glen, MD;  Location: [facility] ORS;  Service: General;  Laterality: Left;  . REDUCTION MAMMAPLASTY Left   . REVERSE SHOULDER ARTHROPLASTY Right 12/03/2020   Procedure: REVERSE SHOULDER ARTHROPLASTY;  Surgeon: Corky Mull, MD;  Location: [facility] ORS;  Service: Orthopedics;  Laterality: Right;  . ROUX-EN-Y GASTRIC BYPASS  01/26/2009  . SHOULDER ARTHROSCOPY WITH OPEN ROTATOR CUFF REPAIR Right 01/03/2017   Procedure: SHOULDER ARTHROSCOPY WITH OPEN ROTATOR CUFF REPAIR;  Surgeon: Corky Mull, MD;  Location: [facility] ORS;  Service: Orthopedics;  Laterality: Right;  . SHOULDER ARTHROSCOPY WITH SUBACROMIAL DECOMPRESSION, ROTATOR CUFF REPAIR AND BICEP TENDON REPAIR Right 01/03/2017   Procedure: SHOULDER ARTHROSCOPY WITH SUBACROMIAL  DECOMPRESSION, ROTATOR CUFF REPAIR AND BICEP TENDON REPAIR;  Surgeon: Corky Mull, MD;  Location: [facility] ORS;  Service: Orthopedics;  Laterality: Right;  Limited debridement   Patient Active Problem List   Diagnosis Date Noted  . Status post reverse arthroplasty of shoulder, 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  . Lymphedema 07/17/2020  . Right rotator cuff tear arthropathy 04/07/2020  . Edema of both legs 12/31/2019  . Polyneuropathy associated with underlying disease ([place]) 12/31/2019  . Swelling of limb 12/31/2019  . Hematuria, microscopic 06/07/2018  . Age-related osteoporosis without current pathological fracture 12/08/2017  . Tendinitis of right rotator cuff 01/03/2017  . Biceps tendinitis of right upper extremity 12/05/2016  . Complete tear of right rotator cuff 12/05/2016  . 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 History of breast cancer in female 06/02/2016  . Allergic state 04/01/2015  . Arthritis 04/01/2015  . Cancer ([place]) 04/01/2015  . Edema leg 04/01/2015  . Gout 04/01/2015  . Hidradenitis 04/01/2015  . Elevated lymphocyte count 04/01/2015  . Disorder of peripheral nervous system 04/01/2015  . Apnea, sleep 04/01/2015  . Avitaminosis D 04/01/2015  . Breast cancer ([place]) 04/01/2015  . BP (high blood pressure) 04/15/2014  . B12 deficiency 02/24/2014  . A-fib ([place]) 02/17/2014  . Adiposity 06/04/2012  . Depression, major, recurrent, moderate ([place]) 03/26/2012      Prior to Admission medications   Medication Sig Start Date End Date Taking? Authorizing Provider  furosemide (LASIX) 20 MG tablet Take 1 tablet (20 mg total) by mouth daily. 01/19/21 01/19/22 Yes Merlyn Lot, MD  nitrofurantoin, macrocrystal-monohydrate, (MACROBID) 100 MG capsule Take 1 capsule (100 mg total) by mouth 2 (two) times daily for 3 days. 01/19/21 01/22/21 Yes Merlyn Lot, MD  allopurinol (ZYLOPRIM) 300 MG tablet Take 300 mg by mouth daily.      [provider]  Calcium Carb-Cholecalciferol 600-400 MG-UNIT TABS Take 1 tablet by mouth 2 (two) times daily with a meal.    [provider]  carbidopa-levodopa (SINEMET IR) 25-100 MG tablet Take 1.5 tablets by mouth 3 (three) times daily. 06/25/20   [provider]  DULoxetine (CYMBALTA) 60 MG capsule Take 60 mg by mouth 2 (two) times daily.    [provider]  fluticasone (FLONASE) 50 MCG/ACT nasal spray Place 1 spray into both nostrils daily as needed for allergies. Patient not taking: Reported on 12/04/2020    [provider]  gabapentin (NEURONTIN) 600 MG tablet Take 600 mg by mouth 4 (four) times daily. 03/09/15   [provider]  Homeopathic Products (PSORIASIS/ECZEMA RELIEF EX) Apply 1 application topically daily as needed (eczema).    [provider]  HYDROcodone-acetaminophen (NORCO/VICODIN) 5-325 MG tablet Take 1-2 tablets by mouth every 4 (four) hours as needed  for moderate pain (pain score 4-6). 12/04/20   Lattie Corns, PA-C  levothyroxine (SYNTHROID, LEVOTHROID) 50 MCG tablet Take 50 mcg by mouth daily before breakfast.    [provider]  Magnesium 400 MG CAPS Take 400 mg by mouth daily.    [provider]  meloxicam (MOBIC) 15 MG tablet Take 15 mg by mouth daily.    [provider]  metoprolol (LOPRESSOR) 100 MG tablet Take 200 mg by mouth 2 (two) times daily.     [provider]  Multiple Vitamins tablet Take 2 tablets by mouth daily.     [provider]  omeprazole (PRILOSEC) 20 MG capsule Take 20 mg by mouth daily.     [provider]  oxybutynin (DITROPAN) 5 MG tablet Take 5 mg by mouth 2 (two) times daily.    [provider]  potassium chloride (K-DUR,KLOR-CON) 10 MEQ tablet Take 10 mEq by mouth 3 (three) times daily.     [provider]  traMADol (ULTRAM) 50 MG tablet Take 1 tablet (50 mg total) by mouth every 6 (six) hours as needed for  moderate pain. 12/04/20   Lattie Corns, PA-C  triamcinolone (KENALOG) 0.025 % cream Apply 1 application topically 2 (two) times daily. 11/06/20   [provider]  vitamin B-12 (CYANOCOBALAMIN) 1000 MCG tablet Take 1,000 mcg by mouth daily.    [provider]  warfarin (COUMADIN) 1 MG tablet Take 1 mg by mouth at bedtime. Take with 6 mg to equal 7 mg at night    [provider]  warfarin (COUMADIN) 6 MG tablet Take 6 mg by mouth at bedtime. Take with 1 mg to equal 7 mg at night    [provider]    Allergies Advair diskus [fluticasone-salmeterol], Alendronate, Singulair [montelukast], Sulfa antibiotics, and Venlafaxine    Social History Social History   Tobacco Use  . Smoking status: Never Smoker  . Smokeless tobacco: Never Used  Substance Use Topics  . Alcohol use: No    Alcohol/week: 0.0 standard drinks  . Drug use: No    Review of Systems Patient denies headaches, rhinorrhea, blurry vision, numbness, shortness of breath, chest pain, edema, cough, abdominal pain, nausea, vomiting, diarrhea, dysuria, fevers, rashes or hallucinations unless otherwise stated above in HPI. ____________________________________________   PHYSICAL EXAM:  VITAL SIGNS: Vitals:   01/19/21 1320 01/19/21 1436  BP: (!) 125/91 132/71  Pulse: 74 70  Resp: 18 19  Temp: 98.1 F (36.7 C)   SpO2: 94% 100%    Constitutional: Alert and oriented.  Eyes: Conjunctivae are normal.  Head: Atraumatic. Nose: No congestion/rhinnorhea. Mouth/Throat: Mucous membranes are moist.   Neck: No stridor. Painless ROM.  Cardiovascular: Normal rate, regular rhythm. Grossly normal heart sounds.  Good peripheral circulation. Respiratory: Normal respiratory effort.  No retractions. Lungs CTAB. Gastrointestinal: Soft and nontender. No distention. No abdominal bruits. No CVA tenderness. Genitourinary:  Musculoskeletal: No lower extremity tenderness.  2+ BLE.  No joint  effusions. Neurologic:  Normal speech and language. No gross focal neurologic deficits are appreciated. No facial droop Skin:  Skin is warm, dry and intact. No rash noted. Psychiatric: Mood and affect are normal. Speech and behavior are normal.  ____________________________________________   LABS (all labs ordered are listed, but only abnormal results are displayed)  Results for orders placed or performed during the hospital encounter of 01/19/21 (from the past 24 hour(s))  CBC     Status: None   Collection Time: 01/19/21  1:21 PM  Result Value Ref Range   WBC 6.4 4.0 - 10.5 K/uL   RBC 4.22 3.87 - 5.11 MIL/uL   Hemoglobin 13.3 12.0 - 15.0 g/dL   HCT 38.6 36.0 - 46.0 %   MCV 91.5 80.0 - 100.0 fL   MCH 31.5 26.0 - 34.0 pg   MCHC 34.5 30.0 - 36.0 g/dL   RDW 13.5 11.5 - 15.5 %   Platelets 256 150 - 400 K/uL   nRBC 0.0 0.0 - 0.2 %  Basic metabolic panel     Status: Abnormal   Collection Time: 01/19/21  1:21 PM  Result Value Ref Range   Sodium 137 135 - 145 mmol/L   Potassium 4.4 3.5 - 5.1 mmol/L   Chloride 104 98 - 111 mmol/L   CO2 25 22 - 32 mmol/L   Glucose, Bld 95 70 - 99 mg/dL   BUN 14 8 - 23 mg/dL   Creatinine, Ser 0.80 0.44 - 1.00 mg/dL   Calcium 8.7 (L) 8.9 - 10.3 mg/dL   GFR, Estimated >60 >60 mL/min   Anion gap 8 5 - 15  Brain natriuretic peptide     Status: Abnormal   Collection Time: 01/19/21  1:21 PM  Result Value Ref Range   B Natriuretic Peptide 164.8 (H) 0.0 - 100.0 pg/mL  Hepatic function panel     Status: None   Collection Time: 01/19/21  1:21 PM  Result Value Ref Range   Total Protein 7.0 6.5 - 8.1 g/dL   Albumin 3.7 3.5 - 5.0 g/dL   AST 29 15 - 41 U/L   ALT 7 0 - 44 U/L   Alkaline Phosphatase 79 38 - 126 U/L   Total Bilirubin 0.6 0.3 - 1.2 mg/dL   Bilirubin, Direct <0.1 0.0 - 0.2 mg/dL   Indirect Bilirubin NOT CALCULATED 0.3 - 0.9 mg/dL  Urinalysis, Complete w Microscopic Urine, Clean Catch     Status: Abnormal   Collection Time: 01/19/21  5:18 PM   Result Value Ref Range   Color, Urine YELLOW (A) YELLOW   APPearance HAZY (A) CLEAR   Specific Gravity, Urine 1.009 1.005 - 1.030   pH 7.0 5.0 - 8.0   Glucose, UA NEGATIVE NEGATIVE mg/dL   Hgb urine dipstick MODERATE (A) NEGATIVE   Bilirubin Urine NEGATIVE NEGATIVE   Ketones, ur NEGATIVE NEGATIVE mg/dL   Protein, ur NEGATIVE NEGATIVE mg/dL   Nitrite NEGATIVE NEGATIVE   Leukocytes,Ua MODERATE (A) NEGATIVE   RBC / HPF 6-10 0 - 5 RBC/hpf   WBC, UA 21-50 0 - 5 WBC/hpf   Bacteria, UA MANY (A) NONE SEEN   Squamous Epithelial / LPF 0-5 0 - 5   ____________________________________________  EKG My review and personal interpretation at Time: 17:11   Indication: leg swelling  Rate: 60  Rhythm: sinus Axis: normal Other: normal intervals, no stemi ____________________________________________  RADIOLOGY  I personally reviewed all radiographic images ordered to evaluate for the above acute complaints and reviewed radiology reports and findings.  These findings were personally discussed with the patient.  Please see medical record for radiology report.  ____________________________________________   PROCEDURES  Procedure(s) performed:  Procedures    Critical Care performed: no ____________________________________________   INITIAL IMPRESSION / ASSESSMENT AND PLAN / ED COURSE  Pertinent labs & imaging results that were available during my care of the patient were reviewed by me and considered in my medical decision making (see chart for details).   DDX: CHF, edema, anasarca, lymphedema, DVT  Suzanne Dixon is  a 70 y.o. who presents to the ED with presentation as described above.  Patient nontoxic-appearing no acute distress.  Does have mild lower extremity pitting edema.  No sign of DVT.  No sign of pulmonary edema.  Does have borderline cardiomegaly.  Renal function normal.  Will trial Lasix will give outpatient follow-up cardiology.  Urinalysis does show bacteria.  States that she  has been having some urinary frequency.  Will give antibiotic.  She has follow-up with Dr. Ubaldo Glassing in cardiology this coming week.  She is already on potassium supplement.  Is established with PCP.  Discussed signs and symptoms for which patient should return to the ER.     The patient was evaluated in Emergency Department today for the symptoms described in the history of present illness. He/she was evaluated in the context of the global COVID-19 pandemic, which necessitated consideration that the patient might be at risk for infection with the SARS-CoV-2 virus that causes COVID-19. Institutional protocols and algorithms that pertain to the evaluation of patients at risk for COVID-19 are in a state of rapid change based on information released by regulatory bodies including the CDC and federal and state organizations. These policies and algorithms were followed during the patient's care in the ED.  As part of my medical decision making, I reviewed the following data within the [place] notes reviewed and incorporated, Labs reviewed, notes from prior ED visits and [place] Controlled Substance Database   ____________________________________________   FINAL CLINICAL IMPRESSION(S) / ED DIAGNOSES  Final diagnoses:  Peripheral edema      NEW MEDICATIONS STARTED DURING THIS VISIT:  New Prescriptions   FUROSEMIDE (LASIX) 20 MG TABLET    Take 1 tablet (20 mg total) by mouth daily.   NITROFURANTOIN, MACROCRYSTAL-MONOHYDRATE, (MACROBID) 100 MG CAPSULE    Take 1 capsule (100 mg total) by mouth 2 (two) times daily for 3 days.     Note:  This document was prepared using Dragon voice recognition software and may include unintentional dictation errors.    Merlyn Lot, MD 01/19/21 [phone]

## 2021-02-01 ENCOUNTER — Other Ambulatory Visit: Payer: Self-pay

## 2021-02-01 ENCOUNTER — Observation Stay
Admission: EM | Admit: 2021-02-01 | Discharge: 2021-02-02 | Disposition: A | Discharge status: Home / Self Care | Payer: Medicare HMO | Attending: Family Medicine | Admitting: Family Medicine

## 2021-02-01 ENCOUNTER — Emergency Department: Payer: Medicare HMO

## 2021-02-01 DIAGNOSIS — E039 Hypothyroidism, unspecified: Secondary | ICD-10-CM | POA: Diagnosis not present

## 2021-02-01 DIAGNOSIS — Z853 Personal history of malignant neoplasm of breast: Secondary | ICD-10-CM | POA: Diagnosis not present

## 2021-02-01 DIAGNOSIS — G20 Parkinson's disease: Secondary | ICD-10-CM | POA: Insufficient documentation

## 2021-02-01 DIAGNOSIS — Z79899 Other long term (current) drug therapy: Secondary | ICD-10-CM | POA: Insufficient documentation

## 2021-02-01 DIAGNOSIS — R4182 Altered mental status, unspecified: Secondary | ICD-10-CM | POA: Diagnosis present

## 2021-02-01 DIAGNOSIS — R4701 Aphasia: Principal | ICD-10-CM | POA: Insufficient documentation

## 2021-02-01 DIAGNOSIS — Z20822 Contact with and (suspected) exposure to covid-19: Secondary | ICD-10-CM | POA: Diagnosis not present

## 2021-02-01 DIAGNOSIS — I480 Paroxysmal atrial fibrillation: Secondary | ICD-10-CM | POA: Diagnosis present

## 2021-02-01 DIAGNOSIS — I10 Essential (primary) hypertension: Secondary | ICD-10-CM | POA: Diagnosis present

## 2021-02-01 DIAGNOSIS — I639 Cerebral infarction, unspecified: Secondary | ICD-10-CM | POA: Diagnosis present

## 2021-02-01 DIAGNOSIS — Z7901 Long term (current) use of anticoagulants: Secondary | ICD-10-CM | POA: Diagnosis not present

## 2021-02-01 DIAGNOSIS — L0390 Cellulitis, unspecified: Secondary | ICD-10-CM | POA: Diagnosis present

## 2021-02-01 DIAGNOSIS — I82409 Acute embolism and thrombosis of unspecified deep veins of unspecified lower extremity: Secondary | ICD-10-CM

## 2021-02-01 DIAGNOSIS — L03116 Cellulitis of left lower limb: Secondary | ICD-10-CM | POA: Diagnosis present

## 2021-02-01 DIAGNOSIS — Z96611 Presence of right artificial shoulder joint: Secondary | ICD-10-CM | POA: Diagnosis not present

## 2021-02-01 LAB — CBC WITH DIFFERENTIAL/PLATELET
Abs Immature Granulocytes: 0.05 10*3/uL (ref 0.00–0.07)
Basophils Absolute: 0 10*3/uL (ref 0.0–0.1)
Basophils Relative: 0 %
Eosinophils Absolute: 0 10*3/uL (ref 0.0–0.5)
Eosinophils Relative: 0 %
HCT: 38.7 % (ref 36.0–46.0)
Hemoglobin: 13.6 g/dL (ref 12.0–15.0)
Immature Granulocytes: 0 %
Lymphocytes Relative: 25 %
Lymphs Abs: 3 10*3/uL (ref 0.7–4.0)
MCH: 31.4 pg (ref 26.0–34.0)
MCHC: 35.1 g/dL (ref 30.0–36.0)
MCV: 89.4 fL (ref 80.0–100.0)
Monocytes Absolute: 1.2 10*3/uL — ABNORMAL HIGH (ref 0.1–1.0)
Monocytes Relative: 10 %
Neutro Abs: 7.7 10*3/uL (ref 1.7–7.7)
Neutrophils Relative %: 65 %
Platelets: 242 10*3/uL (ref 150–400)
RBC: 4.33 MIL/uL (ref 3.87–5.11)
RDW: 13.2 % (ref 11.5–15.5)
WBC: 12 10*3/uL — ABNORMAL HIGH (ref 4.0–10.5)
nRBC: 0 % (ref 0.0–0.2)

## 2021-02-01 LAB — COMPREHENSIVE METABOLIC PANEL
ALT: 18 U/L (ref 0–44)
AST: 36 U/L (ref 15–41)
Albumin: 3.7 g/dL (ref 3.5–5.0)
Alkaline Phosphatase: 85 U/L (ref 38–126)
Anion gap: 12 (ref 5–15)
BUN: 12 mg/dL (ref 8–23)
CO2: 24 mmol/L (ref 22–32)
Calcium: 8.6 mg/dL — ABNORMAL LOW (ref 8.9–10.3)
Chloride: 101 mmol/L (ref 98–111)
Creatinine, Ser: 0.72 mg/dL (ref 0.44–1.00)
GFR, Estimated: >60 mL/min (ref >60)
Glucose, Bld: 123 mg/dL — ABNORMAL HIGH (ref 70–99)
Potassium: 3.3 mmol/L — ABNORMAL LOW (ref 3.5–5.1)
Sodium: 137 mmol/L (ref 135–145)
Total Bilirubin: 1.6 mg/dL — ABNORMAL HIGH (ref 0.3–1.2)
Total Protein: 7.7 g/dL (ref 6.5–8.1)

## 2021-02-01 LAB — ETHANOL: Alcohol, Ethyl (B): <10 mg/dL (ref <10)

## 2021-02-01 LAB — RESP PANEL BY RT-PCR (FLU A&B, COVID) ARPGX2
Influenza A by PCR: NEGATIVE
Influenza B by PCR: NEGATIVE
SARS Coronavirus 2 by RT PCR: NEGATIVE

## 2021-02-01 LAB — APTT: aPTT: 32 seconds (ref 24–36)

## 2021-02-01 LAB — PROTIME-INR
INR: 1.5 — ABNORMAL HIGH (ref 0.8–1.2)
Prothrombin Time: 17.7 seconds — ABNORMAL HIGH (ref 11.4–15.2)

## 2021-02-01 MED ORDER — WARFARIN SODIUM 6 MG PO TABS: 6.0000 mg | ORAL_TABLET | Freq: Every day | ORAL | Status: DC

## 2021-02-01 MED ORDER — WARFARIN - PHYSICIAN DOSING INPATIENT: Freq: Every day | Status: DC

## 2021-02-01 MED ORDER — ASPIRIN 81 MG PO CHEW
324.0000 mg | CHEWABLE_TABLET | Freq: Once | ORAL | Status: AC
  Administered 2021-02-01: 324 mg via ORAL
  Filled 2021-02-01: qty 4

## 2021-02-01 MED ORDER — GABAPENTIN 600 MG PO TABS
600.0000 mg | ORAL_TABLET | Freq: Four times a day (QID) | ORAL | Status: DC
  Administered 2021-02-01 – 2021-02-02 (×5): 600 mg via ORAL
  Filled 2021-02-01 (×5): qty 1

## 2021-02-01 MED ORDER — ALLOPURINOL 300 MG PO TABS
300.0000 mg | ORAL_TABLET | Freq: Every day | ORAL | Status: DC
  Administered 2021-02-02: 10:00:00 300 mg via ORAL
  Filled 2021-02-01: qty 1
  Filled 2021-02-01: qty 3
  Filled 2021-02-01: qty 1

## 2021-02-01 MED ORDER — LABETALOL HCL 5 MG/ML IV SOLN
10.0000 mg | Freq: Once | INTRAVENOUS | Status: AC
  Administered 2021-02-01: 10 mg via INTRAVENOUS
  Filled 2021-02-01: qty 4

## 2021-02-01 MED ORDER — POTASSIUM CHLORIDE CRYS ER 20 MEQ PO TBCR
40.0000 meq | EXTENDED_RELEASE_TABLET | Freq: Once | ORAL | Status: AC
  Administered 2021-02-01: 40 meq via ORAL
  Filled 2021-02-01: qty 2

## 2021-02-01 MED ORDER — LEVOTHYROXINE SODIUM 50 MCG PO TABS
50.0000 ug | ORAL_TABLET | Freq: Every day | ORAL | Status: DC
  Administered 2021-02-02: 50 ug via ORAL
  Filled 2021-02-01: qty 1

## 2021-02-01 MED ORDER — ACETAMINOPHEN 325 MG PO TABS
650.0000 mg | ORAL_TABLET | ORAL | Status: DC | PRN
Start: 2021-02-01 — End: 2021-02-03

## 2021-02-01 MED ORDER — CALCIUM CARB-CHOLECALCIFEROL 600-400 MG-UNIT PO TABS: 1.0000 | ORAL_TABLET | Freq: Two times a day (BID) | ORAL | Status: DC

## 2021-02-01 MED ORDER — MAGNESIUM 400 MG PO CAPS: 400.0000 mg | ORAL_CAPSULE | Freq: Every day | ORAL | Status: DC

## 2021-02-01 MED ORDER — WARFARIN SODIUM 6 MG PO TABS
7.0000 mg | ORAL_TABLET | Freq: Once | ORAL | Status: DC
  Filled 2021-02-01: qty 1

## 2021-02-01 MED ORDER — TRIAMCINOLONE ACETONIDE 0.025 % EX CREA
1.0000 "application " | TOPICAL_CREAM | Freq: Two times a day (BID) | CUTANEOUS | Status: DC
  Administered 2021-02-02: 1 via TOPICAL
  Filled 2021-02-01: qty 15

## 2021-02-01 MED ORDER — STROKE: EARLY STAGES OF RECOVERY BOOK: Freq: Once | Status: AC

## 2021-02-01 MED ORDER — CEPHALEXIN 500 MG PO CAPS
500.0000 mg | ORAL_CAPSULE | Freq: Four times a day (QID) | ORAL | Status: DC
Start: 2021-02-01 — End: 2021-02-03
  Administered 2021-02-01 – 2021-02-02 (×5): 500 mg via ORAL
  Filled 2021-02-01 (×5): qty 1

## 2021-02-01 MED ORDER — HYDROCODONE-ACETAMINOPHEN 5-325 MG PO TABS: 1.0000 | ORAL_TABLET | ORAL | Status: DC | PRN

## 2021-02-01 MED ORDER — OXYBUTYNIN CHLORIDE 5 MG PO TABS
5.0000 mg | ORAL_TABLET | Freq: Two times a day (BID) | ORAL | Status: DC
  Administered 2021-02-01 – 2021-02-02 (×2): 5 mg via ORAL
  Filled 2021-02-01 (×2): qty 1

## 2021-02-01 MED ORDER — ADULT MULTIVITAMIN W/MINERALS CH
2.0000 | ORAL_TABLET | Freq: Every day | ORAL | Status: DC
  Administered 2021-02-02: 2 via ORAL
  Filled 2021-02-01: qty 2

## 2021-02-01 MED ORDER — VITAMIN B-12 1000 MCG PO TABS
1000.0000 ug | ORAL_TABLET | Freq: Every day | ORAL | Status: DC
  Administered 2021-02-02: 10:00:00 1000 ug via ORAL
  Filled 2021-02-01: qty 1

## 2021-02-01 MED ORDER — PANTOPRAZOLE SODIUM 40 MG PO TBEC
40.0000 mg | DELAYED_RELEASE_TABLET | Freq: Every day | ORAL | Status: DC
  Administered 2021-02-02: 10:00:00 40 mg via ORAL
  Filled 2021-02-01: qty 1

## 2021-02-01 MED ORDER — WARFARIN SODIUM 1 MG PO TABS: 1.0000 mg | ORAL_TABLET | Freq: Every day | ORAL | Status: DC

## 2021-02-01 MED ORDER — CARBIDOPA-LEVODOPA 25-100 MG PO TABS
1.5000 | ORAL_TABLET | Freq: Three times a day (TID) | ORAL | Status: DC
  Administered 2021-02-01 – 2021-02-02 (×4): 1.5 via ORAL
  Filled 2021-02-01 (×2): qty 2
  Filled 2021-02-01: qty 1.5

## 2021-02-01 MED ORDER — DULOXETINE HCL 60 MG PO CPEP
60.0000 mg | ORAL_CAPSULE | Freq: Two times a day (BID) | ORAL | Status: DC
  Administered 2021-02-01 – 2021-02-02 (×2): 60 mg via ORAL
  Filled 2021-02-01: qty 2
  Filled 2021-02-01 (×2): qty 1
  Filled 2021-02-01: qty 2
  Filled 2021-02-01 (×2): qty 1

## 2021-02-01 MED ORDER — ACETAMINOPHEN 650 MG RE SUPP: 650.0000 mg | RECTAL | Status: DC | PRN

## 2021-02-01 MED ORDER — ACETAMINOPHEN 160 MG/5ML PO SOLN
650.0000 mg | ORAL | Status: DC | PRN
  Filled 2021-02-01: qty 20.3

## 2021-02-01 NOTE — ED Triage Notes (Addendum)
BIB EMS for SOB. Family called bc patient couldnt speak clearly. Has not taken her anxiety medication. EMS advised she appears more anxious. STROKE screen is negative. Seen 5/3 in ER for swelling in lower legs and this is baseline. Lives alone. Pt has HX of exzema.  99% on RA  152/80 99 HR  125 BGL

## 2021-02-01 NOTE — Consult Note (Signed)
Referring Physician: Dr. Cherylann Banas    Chief Complaint: Trouble with speech  HPI: Suzanne Dixon is an 70 y.o. female with a PMHx of atrial fibrillation (on warfarin), HTN, Parkinson's disease, B12 deficiency, breast cancer, depression, dyspnea, BLE edema, gout, hydradenitis, hypothyroidism, neuropathy and sleep apnea (on CPAP) who presented to the ED this morning with complaints of SOB, AMS and inability to speak clearly. Daughter noticed the speech problem while talking to the patient over the phone. She did also seem more anxious than normal per family, who also noted that she had not taken her anxiety medication. On arrival to the ED, her vitals were as follows: 99% on RA, 152/80, 99 HR, 125 BGL.  Labs in the ED reveal a mild leukocytosis (12.0), PT of 17.7 and INR subtherapeutic at 1.5.   Last known normal per the patient's sister was on Thursday when they were together socializing at home. The patient is unable to provide a clear LKN; she does state that she was aware of having trouble "saying the words" when speaking with daughter over the telephone this AM. She states that her speech is now back to baseline; her sister, who is at the bedside, also states that her speech is at baseline.   EKG: Sinus rhythm Borderline short PR interval Left axis deviation Borderline T abnormalities, diffuse leads Artifact in lead(s) I II III aVR aVL aVF V1  LSN: Thursday afternoon tPA Given: No: Out of time window.   Past Medical History:  Diagnosis Date  . A-fib ([place])   . Anemia   . B12 deficiency   . Breast cancer ([place]) 03/31/2013   Right - chemo- mastectomy  . Breast cancer ([place]) 1991   Rt.- radiation  . Complication of anesthesia 01/2009   Recent years with general anesthesia had itching following surgery  . DDD (degenerative disc disease), cervical   . Depression   . Diverticulitis   . Dyspnea   . Edema of both legs   . GERD (gastroesophageal reflux disease)   . Gout   . H/O mastectomy  2014   per patient  . H/O: cesarean section 1987   per patient report  . H/O: hysterectomy 1996   per patient  . Hydradenitis   . Hypertension   . Hypothyroidism   . Neuropathy   . 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 Parkinson's disease ([place])   . Personal history of chemotherapy   . Personal history of radiation therapy   . Ruptured cervical disc 2002   per patient   . Sleep apnea    cpap machine  . Tachycardia     Past Surgical History:  Procedure Laterality Date  . ABDOMINAL HYSTERECTOMY    . BACK SURGERY  2002   plate and 2 screws in neck  . BREAST BIOPSY Right 1991,2014   Positive  . BREAST CYST ASPIRATION Left   . BREAST SURGERY Right 2014   mastectomy  . CATARACT EXTRACTION W/PHACO Left 05/17/2019   Procedure: CATARACT EXTRACTION PHACO AND INTRAOCULAR LENS PLACEMENT (IOC);  Surgeon: Eulogio Bear, MD;  Location: [facility] ORS;  Service: Ophthalmology;  Laterality: Left;  [place] 00:28 CDE 2.15 FLUID PACK LOT # [id number]  . CATARACT EXTRACTION W/PHACO Right 06/14/2019   Procedure: CATARACT EXTRACTION PHACO AND INTRAOCULAR LENS PLACEMENT ([place]) RIGHT;  Surgeon: Eulogio Bear, MD;  Location: [facility] ORS;  Service: Ophthalmology;  Laterality: Right;  [place] 00:35.0 CDE 2.37 Fluid Pack Lot #[id number]  . CESAREAN SECTION    . CHOLECYSTECTOMY  04/09/2012  . COLONOSCOPY WITH PROPOFOL N/A 06/22/2015   Procedure: COLONOSCOPY WITH PROPOFOL;  Surgeon: Manya Silvas, MD;  Location: [facility] ENDOSCOPY;  Service: Endoscopy;  Laterality: N/A;  . ESOPHAGOGASTRODUODENOSCOPY    . EYE SURGERY    . LAPAROSCOPIC GASTRIC BYPASS    . MASTECTOMY Right   . NEUROPLASTY / TRANSPOSITION ULNAR NERVE AT ELBOW    . PORT-A-CATH REMOVAL Left 05/13/2015   Procedure: REMOVAL PORT-A-CATH LEFT CHEST ;  Surgeon: Florene Glen, MD;  Location: [facility] ORS;  Service: General;  Laterality: Left;  . REDUCTION MAMMAPLASTY Left   . REVERSE SHOULDER ARTHROPLASTY Right 12/03/2020   Procedure: REVERSE SHOULDER ARTHROPLASTY;   Surgeon: Corky Mull, MD;  Location: [facility] ORS;  Service: Orthopedics;  Laterality: Right;  . ROUX-EN-Y GASTRIC BYPASS  01/26/2009  . SHOULDER ARTHROSCOPY WITH OPEN ROTATOR CUFF REPAIR Right 01/03/2017   Procedure: SHOULDER ARTHROSCOPY WITH OPEN ROTATOR CUFF REPAIR;  Surgeon: Corky Mull, MD;  Location: [facility] ORS;  Service: Orthopedics;  Laterality: Right;  . SHOULDER ARTHROSCOPY WITH SUBACROMIAL DECOMPRESSION, ROTATOR CUFF REPAIR AND BICEP TENDON REPAIR Right 01/03/2017   Procedure: SHOULDER ARTHROSCOPY WITH SUBACROMIAL DECOMPRESSION, ROTATOR CUFF REPAIR AND BICEP TENDON REPAIR;  Surgeon: Corky Mull, MD;  Location: [facility] ORS;  Service: Orthopedics;  Laterality: Right;  Limited debridement    Family History  Problem Relation Age of Onset  . Hypertension Mother   . Lymphoma Mother   . Cancer Sister        Breast  . Breast cancer Sister 63   Social History:  reports that she has never smoked. She has never used smokeless tobacco. She reports that she does not drink alcohol and does not use drugs.  Allergies:  Allergies  Allergen Reactions  . Advair Diskus [Fluticasone-Salmeterol] Other (See Comments)    Joint pain   . Alendronate     Muscle pain  . Singulair [Montelukast] Other (See Comments)    "muscle pain"  . Sulfa Antibiotics Hives  . Venlafaxine Other (See Comments)    "altered mental status/tremors"    Home Medications: No current facility-administered medications on file prior to encounter.   Current Outpatient Medications on File Prior to Encounter  Medication Sig Dispense Refill  . allopurinol (ZYLOPRIM) 300 MG tablet Take 300 mg by mouth daily.     . Calcium Carb-Cholecalciferol 600-400 MG-UNIT TABS Take 1 tablet by mouth 2 (two) times daily with a meal.    . carbidopa-levodopa (SINEMET IR) 25-100 MG tablet Take 1.5 tablets by mouth 3 (three) times daily.    . DULoxetine (CYMBALTA) 60 MG capsule Take 60 mg by mouth 2 (two) times daily.    . fluticasone (FLONASE) 50  MCG/ACT nasal spray Place 1 spray into both nostrils daily as needed for allergies. (Patient not taking: Reported on 12/04/2020)    . furosemide (LASIX) 20 MG tablet Take 1 tablet (20 mg total) by mouth daily. 7 tablet 0  . gabapentin (NEURONTIN) 600 MG tablet Take 600 mg by mouth 4 (four) times daily.  11  . Homeopathic Products (PSORIASIS/ECZEMA RELIEF EX) Apply 1 application topically daily as needed (eczema).    Marland Kitchen HYDROcodone-acetaminophen (NORCO/VICODIN) 5-325 MG tablet Take 1-2 tablets by mouth every 4 (four) hours as needed for moderate pain (pain score 4-6). 60 tablet 0  . levothyroxine (SYNTHROID, LEVOTHROID) 50 MCG tablet Take 50 mcg by mouth daily before breakfast.    . Magnesium 400 MG CAPS Take 400 mg by mouth daily.    . meloxicam (MOBIC)  15 MG tablet Take 15 mg by mouth daily.    . metoprolol (LOPRESSOR) 100 MG tablet Take 200 mg by mouth 2 (two) times daily.     . Multiple Vitamins tablet Take 2 tablets by mouth daily.     Marland Kitchen omeprazole (PRILOSEC) 20 MG capsule Take 20 mg by mouth daily.     Marland Kitchen oxybutynin (DITROPAN) 5 MG tablet Take 5 mg by mouth 2 (two) times daily.    . potassium chloride (K-DUR,KLOR-CON) 10 MEQ tablet Take 10 mEq by mouth 3 (three) times daily.     . traMADol (ULTRAM) 50 MG tablet Take 1 tablet (50 mg total) by mouth every 6 (six) hours as needed for moderate pain. 30 tablet 0  . triamcinolone (KENALOG) 0.025 % cream Apply 1 application topically 2 (two) times daily.    . vitamin B-12 (CYANOCOBALAMIN) 1000 MCG tablet Take 1,000 mcg by mouth daily.    Marland Kitchen warfarin (COUMADIN) 1 MG tablet Take 1 mg by mouth at bedtime. Take with 6 mg to equal 7 mg at night    . warfarin (COUMADIN) 6 MG tablet Take 6 mg by mouth at bedtime. Take with 1 mg to equal 7 mg at night       ROS: The patient complains of LLE swelling with pain. She denies any fevers but feels unusually cold. She denies any limb weakness or sensory loss. She endorses intermittent right hand tremor. Other ROS as  per HPI.   Physical Examination: Blood pressure (!) 189/99, pulse 89, temperature 98 F (36.7 C), temperature source Oral, resp. rate (!) 21, height 5' (1.524 m), SpO2 94 %.  HEENT: [place]/AT Lungs: Respirations unlabored Ext: LLE swelling with redness and increased skin temperature from below the knee to dorsum of foot. RLE with swelling but no redness.   Neurologic Examination: Mental Status: Awake, alert and oriented x 5. Speech is fluent with intact comprehension and naming. Repetition intact. Pleasant and cooperative.  Cranial Nerves: II:  Temporal visual fields intact with no extinction to DSS. PERRL.  III,IV, VI: No ptosis. EOMI. No nystagmus.  V,VII: Smile symmetric, facial temp sensation equal bilaterally VIII: hearing intact to voice IX,X: No hypophonia XI: Slight lag on the right with shoulder shrug XII: No lingual dysarthria.  Motor: RUE with 4 to 4+/5 strength proximally and distally. Subtle right sided drift and positive orbiting fingers test also noted.  RLE 5/5 LUE 5/5 LLE 5/5 Sensory: FT and temp sensation intact x 4. No extinction to DSS.  Deep Tendon Reflexes:  1+ bilateral brachioradialis and biceps.  Unelicitable patellar reflexes bilaterally.  2+ left achilles. 0 right achilles.  Plantars: Mute bilaterally Cerebellar: Intermittent rest tremor of right wrist/hand. No ataxia with FNF bilaterally.  Gait: Deferred.   Results for orders placed or performed during the hospital encounter of 02/01/21 (from the past 48 hour(s))  CBC with Differential     Status: Abnormal   Collection Time: 02/01/21 10:40 AM  Result Value Ref Range   WBC 12.0 (H) 4.0 - 10.5 K/uL   RBC 4.33 3.87 - 5.11 MIL/uL   Hemoglobin 13.6 12.0 - 15.0 g/dL   HCT 38.7 36.0 - 46.0 %   MCV 89.4 80.0 - 100.0 fL   MCH 31.4 26.0 - 34.0 pg   MCHC 35.1 30.0 - 36.0 g/dL   RDW 13.2 11.5 - 15.5 %   Platelets 242 150 - 400 K/uL   nRBC 0.0 0.0 - 0.2 %   Neutrophils Relative % 65 %  Neutro Abs 7.7 1.7  - 7.7 K/uL   Lymphocytes Relative 25 %   Lymphs Abs 3.0 0.7 - 4.0 K/uL   Monocytes Relative 10 %   Monocytes Absolute 1.2 (H) 0.1 - 1.0 K/uL   Eosinophils Relative 0 %   Eosinophils Absolute 0.0 0.0 - 0.5 K/uL   Basophils Relative 0 %   Basophils Absolute 0.0 0.0 - 0.1 K/uL   Immature Granulocytes 0 %   Abs Immature Granulocytes 0.05 0.00 - 0.07 K/uL    Comment: Performed at [facility], [address] [place], [place] [address]  Comprehensive metabolic panel     Status: Abnormal   Collection Time: 02/01/21 10:40 AM  Result Value Ref Range   Sodium 137 135 - 145 mmol/L   Potassium 3.3 (L) 3.5 - 5.1 mmol/L   Chloride 101 98 - 111 mmol/L   CO2 24 22 - 32 mmol/L   Glucose, Bld 123 (H) 70 - 99 mg/dL    Comment: Glucose reference range applies only to samples taken after fasting for at least 8 hours.   BUN 12 8 - 23 mg/dL   Creatinine, Ser 0.72 0.44 - 1.00 mg/dL   Calcium 8.6 (L) 8.9 - 10.3 mg/dL   Total Protein 7.7 6.5 - 8.1 g/dL   Albumin 3.7 3.5 - 5.0 g/dL   AST 36 15 - 41 U/L   ALT 18 0 - 44 U/L   Alkaline Phosphatase 85 38 - 126 U/L   Total Bilirubin 1.6 (H) 0.3 - 1.2 mg/dL   GFR, Estimated >60 >60 mL/min    Comment: (NOTE) Calculated using the CKD-EPI Creatinine Equation (2021)    Anion gap 12 5 - 15    Comment: Performed at [facility], [address], [place], [place] [address]  Resp Panel by RT-PCR (Flu A&B, Covid) Nasopharyngeal Swab     Status: None   Collection Time: 02/01/21 11:30 AM   Specimen: Nasopharyngeal Swab; Nasopharyngeal(NP) swabs in vial transport medium  Result Value Ref Range   SARS Coronavirus 2 by RT PCR NEGATIVE NEGATIVE    Comment: (NOTE) SARS-CoV-2 target nucleic acids are NOT DETECTED.  The SARS-CoV-2 RNA is generally detectable in upper respiratory specimens during the acute phase of infection. The lowest concentration of SARS-CoV-2 viral copies this assay can detect is 138 copies/mL. A negative result does not preclude  SARS-Cov-2 infection and should not be used as the sole basis for treatment or other patient management decisions. A negative result may occur with  improper specimen collection/handling, submission of specimen other than nasopharyngeal swab, presence of viral mutation(s) within the areas targeted by this assay, and inadequate number of viral copies(<138 copies/mL). A negative result must be combined with clinical observations, patient history, and epidemiological information. The expected result is Negative.  Fact Sheet for Patients:  EntrepreneurPulse.com.au  Fact Sheet for Healthcare Providers:  IncredibleEmployment.be  This test is no t yet approved or cleared by the [place] FDA and  has been authorized for detection and/or diagnosis of SARS-CoV-2 by FDA under an Emergency Use Authorization (EUA). This EUA will remain  in effect (meaning this test can be used) for the duration of the COVID-19 declaration under Section 564(b)(1) of the Act, 21 U.S.C.section 360bbb-3(b)(1), unless the authorization is terminated  or revoked sooner.       Influenza A by PCR NEGATIVE NEGATIVE   Influenza B by PCR NEGATIVE NEGATIVE    Comment: (NOTE) The Xpert Xpress SARS-CoV-2/FLU/RSV plus assay is intended as an  aid in the diagnosis of influenza from Nasopharyngeal swab specimens and should not be used as a sole basis for treatment. Nasal washings and aspirates are unacceptable for Xpert Xpress SARS-CoV-2/FLU/RSV testing.  Fact Sheet for Patients: EntrepreneurPulse.com.au  Fact Sheet for Healthcare Providers: IncredibleEmployment.be  This test is not yet approved or cleared by the [place] FDA and has been authorized for detection and/or diagnosis of SARS-CoV-2 by FDA under an Emergency Use Authorization (EUA). This EUA will remain in effect (meaning this test can be used) for the duration of the COVID-19  declaration under Section 564(b)(1) of the Act, 21 U.S.C. section 360bbb-3(b)(1), unless the authorization is terminated or revoked.  Performed at [facility], [address] [place], [place] [address]   Protime-INR     Status: Abnormal   Collection Time: 02/01/21 11:38 AM  Result Value Ref Range   Prothrombin Time 17.7 (H) 11.4 - 15.2 seconds   INR 1.5 (H) 0.8 - 1.2    Comment: (NOTE) INR goal varies based on device and disease states. Performed at [facility], [address] [place], [place] [address]   APTT     Status: None   Collection Time: 02/01/21 11:38 AM  Result Value Ref Range   aPTT 32 24 - 36 seconds    Comment: Performed at [facility], [address] [place], [place] [address]  Ethanol     Status: None   Collection Time: 02/01/21 11:38 AM  Result Value Ref Range   Alcohol, Ethyl (B) <10 <10 mg/dL    Comment: (NOTE) Lowest detectable limit for serum alcohol is 10 mg/dL.  For medical purposes only. Performed at [facility], [address] [place], [place] [address]    CT HEAD WO CONTRAST  Result Date: 02/01/2021 CLINICAL DATA:  Difficulty speaking. EXAM: CT HEAD WITHOUT CONTRAST TECHNIQUE: Contiguous axial images were obtained from the base of the skull through the vertex without intravenous contrast. COMPARISON:  June 25, 2018. FINDINGS: Brain: No evidence of acute infarction, hemorrhage, hydrocephalus, extra-axial collection or mass lesion/mass effect. Vascular: No hyperdense vessel or unexpected calcification. Skull: Normal. Negative for fracture or focal lesion. Sinuses/Orbits: No acute finding. Other: None. IMPRESSION: No acute intracranial abnormality seen. Electronically Signed   By: Marijo Conception M.D.   On: 02/01/2021 11:59   DG Chest Portable 1 View  Result Date: 02/01/2021 CLINICAL DATA:  Shortness of breath EXAM: PORTABLE CHEST 1 VIEW COMPARISON:  01/19/2021 FINDINGS: Cardiomegaly and aortic tortuosity.  There is no edema, consolidation, effusion, or pneumothorax. Glenohumeral arthroplasty on the right. IMPRESSION: Stable compared to prior.  No acute finding. Electronically Signed   By: Monte Fantasia M.D.   On: 02/01/2021 11:16    Assessment: 70 y.o. female with a PMHx of atrial fibrillation, on warfarin, presenting with dysphasia 1. Exam reveals subtle RUE motor signs. No speech deficit noted. Findings referable to a left MCA distribution lesion, most likely a subacute stroke.  2. CT head reveals no acute intracranial abnormality 3. INR is subtherapeutic at 1.5.  4. Stroke Risk Factors - atrial fibrillation, HTN, cancer and sleep apnea  5. Parkinson's disease. On Sinemet. 6. LLE edema with rubor and calor. Suspect possible cellulitis.   Recommendations: 1. MRI brain with MRA head 2. Continue home warfarin. Will need Pharmacy to dose with goal INR of 2-3.  3. Speech consult, PT/OT 4. TTE and carotid ultrasound.  5. Assessment of LLE redness, warmth and swelling per primary team.  6. BP management. Out  of the permissive HTN time window.  7. Cardiac telemetry.  8. Statin. Obtain baseline CK level.    @Electronically  signed: Dr. Kerney Elbe  02/01/2021, 1:32 PM

## 2021-02-01 NOTE — ED Notes (Signed)
MD wants to work up as possible stroke. Unable to obtain last known well due to patient lives at home alone.

## 2021-02-01 NOTE — ED Provider Notes (Signed)
[facility] Emergency Department Provider Note ____________________________________________   Event Date/Time   First MD Initiated Contact with Patient 02/01/21 1026     (approximate)  I have reviewed the triage vital signs and the nursing notes.   HISTORY  Chief Complaint Shortness of Breath and Anxiety  Level 5 caveat: History of present illness limited due to possible aphasia and altered mental status  HPI Suzanne Dixon is a 70 y.o. female with PMH as noted below including atrial fibrillation, hypertension, Parkinson's, and breast cancer who presents with altered mental status and possible aphasia, acute onset sometime this morning or last night.  The patient herself is unable to give much history other than stating her daughter called EMS.   Past Medical History:  Diagnosis Date  . A-fib ([place])   . Anemia   . B12 deficiency   . Breast cancer ([place]) 03/31/2013   Right - chemo- mastectomy  . Breast cancer ([place]) 1991   Rt.- radiation  . Complication of anesthesia 01/2009   Recent years with general anesthesia had itching following surgery  . DDD (degenerative disc disease), cervical   . Depression   . Diverticulitis   . Dyspnea   . Edema of both legs   . GERD (gastroesophageal reflux disease)   . Gout   . H/O mastectomy 2014   per patient  . H/O: cesarean section 1987   per patient report  . H/O: hysterectomy 1996   per patient  . Hydradenitis   . Hypertension   . Hypothyroidism   . Neuropathy   . 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 Parkinson's disease ([place])   . Personal history of chemotherapy   . Personal history of radiation therapy   . Ruptured cervical disc 2002   per patient   . Sleep apnea    cpap machine  . Tachycardia     Patient Active Problem List   Diagnosis Date Noted  . CVA (cerebral vascular accident) ([place]) 02/01/2021  . Cellulitis 02/01/2021  . Hypothyroidism   . Left leg cellulitis   . Status post reverse  arthroplasty of shoulder, 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  . Lymphedema 07/17/2020  . Right rotator cuff tear arthropathy 04/07/2020  . Edema of both legs 12/31/2019  . Polyneuropathy associated with underlying disease ([place]) 12/31/2019  . Swelling of limb 12/31/2019  . Hematuria, microscopic 06/07/2018  . Age-related osteoporosis without current pathological fracture 12/08/2017  . Tendinitis of right rotator cuff 01/03/2017  . Biceps tendinitis of right upper extremity 12/05/2016  . Complete tear of right rotator cuff 12/05/2016  . 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 History of breast cancer in female 06/02/2016  . Allergic state 04/01/2015  . Arthritis 04/01/2015  . Cancer ([place]) 04/01/2015  . Edema leg 04/01/2015  . Gout 04/01/2015  . Hidradenitis 04/01/2015  . Elevated lymphocyte count 04/01/2015  . Disorder of peripheral nervous system 04/01/2015  . Apnea, sleep 04/01/2015  . Avitaminosis D 04/01/2015  . Breast cancer ([place]) 04/01/2015  . Hypertension 04/15/2014  . B12 deficiency 02/24/2014  . AF (paroxysmal atrial fibrillation) ([place]) 02/17/2014  . Adiposity 06/04/2012  . Depression, major, recurrent, moderate ([place]) 03/26/2012    Past Surgical History:  Procedure Laterality Date  . ABDOMINAL HYSTERECTOMY    . BACK SURGERY  2002   plate and 2 screws in neck  . BREAST BIOPSY Right 1991,2014   Positive  . BREAST CYST ASPIRATION Left   .  BREAST SURGERY Right 2014   mastectomy  . CATARACT EXTRACTION W/PHACO Left 05/17/2019   Procedure: CATARACT EXTRACTION PHACO AND INTRAOCULAR LENS PLACEMENT (IOC);  Surgeon: Eulogio Bear, MD;  Location: [facility] ORS;  Service: Ophthalmology;  Laterality: Left;  [place] 00:28 CDE 2.15 FLUID PACK LOT # [id number]  . CATARACT EXTRACTION W/PHACO Right 06/14/2019   Procedure: CATARACT EXTRACTION PHACO AND INTRAOCULAR LENS PLACEMENT ([place]) RIGHT;  Surgeon: Eulogio Bear, MD;  Location:  [facility] ORS;  Service: Ophthalmology;  Laterality: Right;  [place] 00:35.0 CDE 2.37 Fluid Pack Lot [id number]  . CESAREAN SECTION    . CHOLECYSTECTOMY  04/09/2012  . COLONOSCOPY WITH PROPOFOL N/A 06/22/2015   Procedure: COLONOSCOPY WITH PROPOFOL;  Surgeon: Manya Silvas, MD;  Location: [facility] ENDOSCOPY;  Service: Endoscopy;  Laterality: N/A;  . ESOPHAGOGASTRODUODENOSCOPY    . EYE SURGERY    . LAPAROSCOPIC GASTRIC BYPASS    . MASTECTOMY Right   . NEUROPLASTY / TRANSPOSITION ULNAR NERVE AT ELBOW    . PORT-A-CATH REMOVAL Left 05/13/2015   Procedure: REMOVAL PORT-A-CATH LEFT CHEST ;  Surgeon: Florene Glen, MD;  Location: [facility] ORS;  Service: General;  Laterality: Left;  . REDUCTION MAMMAPLASTY Left   . REVERSE SHOULDER ARTHROPLASTY Right 12/03/2020   Procedure: REVERSE SHOULDER ARTHROPLASTY;  Surgeon: Corky Mull, MD;  Location: [facility] ORS;  Service: Orthopedics;  Laterality: Right;  . ROUX-EN-Y GASTRIC BYPASS  01/26/2009  . SHOULDER ARTHROSCOPY WITH OPEN ROTATOR CUFF REPAIR Right 01/03/2017   Procedure: SHOULDER ARTHROSCOPY WITH OPEN ROTATOR CUFF REPAIR;  Surgeon: Corky Mull, MD;  Location: [facility] ORS;  Service: Orthopedics;  Laterality: Right;  . SHOULDER ARTHROSCOPY WITH SUBACROMIAL DECOMPRESSION, ROTATOR CUFF REPAIR AND BICEP TENDON REPAIR Right 01/03/2017   Procedure: SHOULDER ARTHROSCOPY WITH SUBACROMIAL DECOMPRESSION, ROTATOR CUFF REPAIR AND BICEP TENDON REPAIR;  Surgeon: Corky Mull, MD;  Location: [facility] ORS;  Service: Orthopedics;  Laterality: Right;  Limited debridement    Prior to Admission medications   Medication Sig Start Date End Date Taking? Authorizing Provider  allopurinol (ZYLOPRIM) 300 MG tablet Take 300 mg by mouth daily.    Yes [provider]  Calcium Carb-Cholecalciferol 600-400 MG-UNIT TABS Take 1 tablet by mouth 2 (two) times daily with a meal.   Yes [provider]  carbidopa-levodopa (SINEMET IR) 25-100 MG tablet Take 1.5 tablets by mouth 3 (three) times  daily. 06/25/20  Yes [provider]  DULoxetine (CYMBALTA) 60 MG capsule Take 60 mg by mouth 2 (two) times daily.   Yes [provider]  gabapentin (NEURONTIN) 600 MG tablet Take 600 mg by mouth 4 (four) times daily. 03/09/15  Yes [provider]  Homeopathic Products (PSORIASIS/ECZEMA RELIEF EX) Apply 1 application topically daily as needed (eczema).   Yes [provider]  levothyroxine (SYNTHROID, LEVOTHROID) 50 MCG tablet Take 50 mcg by mouth daily before breakfast.   Yes [provider]  Magnesium 400 MG CAPS Take 400 mg by mouth daily.   Yes [provider]  meloxicam (MOBIC) 15 MG tablet Take 15 mg by mouth daily.   Yes [provider]  metoprolol (LOPRESSOR) 100 MG tablet Take 200 mg by mouth 2 (two) times daily.    Yes [provider]  Multiple Vitamins tablet Take 2 tablets by mouth daily.    Yes [provider]  omeprazole (PRILOSEC) 20 MG capsule Take 20 mg by mouth daily.    Yes [provider]  oxybutynin (DITROPAN) 5 MG tablet Take 5 mg by mouth  2 (two) times daily.   Yes [provider]  potassium chloride (K-DUR,KLOR-CON) 10 MEQ tablet Take 10 mEq by mouth 3 (three) times daily.    Yes [provider]  triamcinolone (KENALOG) 0.025 % cream Apply 1 application topically 2 (two) times daily. 11/06/20  Yes [provider]  vitamin B-12 (CYANOCOBALAMIN) 1000 MCG tablet Take 1,000 mcg by mouth daily.   Yes [provider]  warfarin (COUMADIN) 1 MG tablet Take 1 mg by mouth at bedtime. (take with 6mg  tablet to equal 7mg  total)   Yes [provider]  warfarin (COUMADIN) 6 MG tablet Take 6 mg by mouth at bedtime. (take with 1mg  tablet to equal 7mg  total)   Yes [provider]  furosemide (LASIX) 20 MG tablet Take 1 tablet (20 mg total) by mouth daily. Patient not taking: Reported on 02/01/2021 01/19/21 01/19/22  Merlyn Lot, MD   HYDROcodone-acetaminophen (NORCO/VICODIN) 5-325 MG tablet Take 1-2 tablets by mouth every 4 (four) hours as needed for moderate pain (pain score 4-6). Patient not taking: Reported on 02/01/2021 12/04/20   Lattie Corns, PA-C  traMADol (ULTRAM) 50 MG tablet Take 1 tablet (50 mg total) by mouth every 6 (six) hours as needed for moderate pain. Patient not taking: Reported on 02/01/2021 12/04/20   Lattie Corns, PA-C    Allergies Advair diskus [fluticasone-salmeterol], Alendronate, Singulair [montelukast], Sulfa antibiotics, and Venlafaxine  Family History  Problem Relation Age of Onset  . Hypertension Mother   . Lymphoma Mother   . Cancer Sister        Breast  . Breast cancer Sister 66    Social History Social History   Tobacco Use  . Smoking status: Never Smoker  . Smokeless tobacco: Never Used  Substance Use Topics  . Alcohol use: No    Alcohol/week: 0.0 standard drinks  . Drug use: No    Review of Systems Level 5 caveat: Unable to obtain review of systems due to possible altered mental status and/or aphasia   ____________________________________________   PHYSICAL EXAM:  VITAL SIGNS: ED Triage Vitals  Enc Vitals Group     BP 02/01/21 1025 (!) 163/127     Pulse Rate 02/01/21 1025 94     Resp 02/01/21 1025 20     Temp 02/01/21 1025 98 F (36.7 C)     Temp Source 02/01/21 1025 Oral     SpO2 02/01/21 1024 98 %     Weight --      Height 02/01/21 1026 5' (1.524 m)     Head Circumference --      Peak Flow --      Pain Score 02/01/21 1026 0     Pain Loc --      Pain Edu? --      Excl. in [place]? --     Constitutional: Alert, anxious appearing. Eyes: Conjunctivae are normal.  Head: Atraumatic. Nose: No congestion/rhinnorhea. Mouth/Throat: Mucous membranes are moist.   Neck: Normal range of motion.  Cardiovascular: Normal rate, irregular rhythm. Grossly normal heart sounds.  Good peripheral circulation. Respiratory: Normal respiratory effort.  No  retractions. Lungs CTAB. Gastrointestinal: Soft and nontender. No distention.  Genitourinary: No flank tenderness. Musculoskeletal: 1+ right lower extremity edema, 2+ left lower extremity edema with faint erythema and slight abnormal warmth.  Extremities warm and well perfused.   Neurologic: Expressive aphasia.  Motor intact in all extremities.  No facial droop.  No ataxia. Skin:  Skin is warm and dry. No rash noted. Psychiatric:  Anxious appearing.  ____________________________________________   LABS (all labs ordered are listed, but only abnormal results are displayed)  Labs Reviewed  CBC WITH DIFFERENTIAL/PLATELET - Abnormal; Notable for the following components:      Result Value   WBC 12.0 (*)    Monocytes Absolute 1.2 (*)    All other components within normal limits  COMPREHENSIVE METABOLIC PANEL - Abnormal; Notable for the following components:   Potassium 3.3 (*)    Glucose, Bld 123 (*)    Calcium 8.6 (*)    Total Bilirubin 1.6 (*)    All other components within normal limits  PROTIME-INR - Abnormal; Notable for the following components:   Prothrombin Time 17.7 (*)    INR 1.5 (*)    All other components within normal limits  RESP PANEL BY RT-PCR (FLU A&B, COVID) ARPGX2  CULTURE, BLOOD (ROUTINE X 2)  CULTURE, BLOOD (ROUTINE X 2)  APTT  ETHANOL  URINE DRUG SCREEN, QUALITATIVE ([facility] ONLY)  URINALYSIS, ROUTINE W REFLEX MICROSCOPIC   ____________________________________________  EKG  ED ECG REPORT I, Arta Silence, the attending physician, personally viewed and interpreted this ECG.  Date: 02/01/2021 EKG Time: 1036 Rate: 92 Rhythm: Atrial fibrillation QRS Axis: normal Intervals: normal ST/T Wave abnormalities: Nonspecific ST abnormalities Narrative Interpretation: Nonspecific abnormalities with no evidence of acute ischemia  ____________________________________________  RADIOLOGY  CT head: No acute  abnormality  ____________________________________________   PROCEDURES  Procedure(s) performed: No  Procedures  Critical Care performed: Yes  CRITICAL CARE Performed by: Arta Silence   Total critical care time: 30 minutes  Critical care time was exclusive of separately billable procedures and treating other patients.  Critical care was necessary to treat or prevent imminent or life-threatening deterioration.  Critical care was time spent personally by me on the following activities: development of treatment plan with patient and/or surrogate as well as nursing, discussions with consultants, evaluation of patient's response to treatment, examination of patient, obtaining history from patient or surrogate, ordering and performing treatments and interventions, ordering and review of laboratory studies, ordering and review of radiographic studies, pulse oximetry and re-evaluation of patient's condition. ____________________________________________   INITIAL IMPRESSION / ASSESSMENT AND PLAN / ED COURSE  Pertinent labs & imaging results that were available during my care of the patient were reviewed by me and considered in my medical decision making (see chart for details).  70 year old female with PMH as noted above including atrial fibrillation, hypertension, and Parkinson's presents with unclear onset of possible aphasia and/or altered mental status.  The patient herself is not able to give much history.  I called the daughter Suzanne Dixon and was able to obtain additional information.  She states that a friend called the patient this morning and noted her to be speaking differently.  She then alerted family who called the patient and tried to persuade her to use her life alert.  The family then called EMS.  It is unclear when the patient was last known well.  I reviewed the past medical records in [place].  The patient was last seen in the ED earlier this month with bilateral  lower extremity swelling and had a bilateral DVT study which was negative.  On exam, the patient is alert and appears anxious.  She demonstrates expressive aphasia, having difficulty finding words to try to explain what is going on although the RN reported that this seems to be somewhat waxing and waning.  Neurologic exam is otherwise nonfocal.  Exam is otherwise notable for bilateral lower extremity  edema which appears significantly worse on the left.  Initially, based on EMS report and triage, the primary chief complaint appeared to be anxiety and shortness of breath.  However on my exam I became concerned for possible stroke.  Given that the time of onset is unclear, the patient is not in a window for intervention.  Therefore, there is no indication to activate code stroke.  I consulted Dr. Cheral Marker from neurology who will come to evaluate the patient.  Overall presentation is most concerning for acute stroke although differential also includes altered mental status due to metabolic or infectious cause versus possible anxiety.  I am concerned for cellulitis in the left lower extremity as well.  We will obtain a CT, neurology consult, lab work-up, and reassess.  ----------------------------------------- 2:25 PM on 02/01/2021 -----------------------------------------  Dr. Cheral Marker has evaluated the patient.  From additional history it appears that no one had spoken to the patient since few days ago, so her last known well is truly unknown.  He recommends MRI.  I consulted Dr. Francine Graven from the hospitalist service for admission. ____________________________________________   FINAL CLINICAL IMPRESSION(S) / ED DIAGNOSES  Final diagnoses:  Aphasia      NEW MEDICATIONS STARTED DURING THIS VISIT:  Current Discharge Medication List       Note:  This document was prepared using Dragon voice recognition software and may include unintentional dictation errors.    Arta Silence,  MD 02/01/21 [phone]

## 2021-02-01 NOTE — Consult Note (Signed)
WOC Nurse Consult Note: Reason for Consult: Nonblanching erythema to buttocks present on admission.  Upon later evaluation, Bedside RN believes the area is MASD. It now blanches. Wound type:pressure vs Moisture Pressure Injury POA: Yes Measurement:N/A Wound bed:N/A Drainage (amount, consistency, odor) None Periwound:intact Dressing procedure/placement/frequency:Patient with erythematous, now blanching erythema to buttocks. Sacral foam is ordered , also to float heels and to turn from side to side while in bed.  [place] nursing team will not follow, but will remain available to this patient, the nursing and medical teams.  Please re-consult if needed. Thanks, Maudie Flakes, MSN, RN, [place], Arther Abbott  Pager# [phone]

## 2021-02-01 NOTE — ED Notes (Signed)
Patient transported to CT 

## 2021-02-01 NOTE — Plan of Care (Signed)

## 2021-02-01 NOTE — H&P (Signed)
History and Physical    Suzanne Dixon:[id number] DOB: March 11, 1951 DOA: 02/01/2021  PCP: Adin Hector, MD   Patient coming from: Home  I have personally briefly reviewed patient's old medical records in [place]  Chief Complaint: Trouble with speech  HPI: Suzanne Dixon is a 70 y.o. female with medical history significant for A. fib on chronic anticoagulation with Coumadin, hypertension, Parkinson's disease, history of breast cancer, depression, B12 deficiency, hypothyroidism and sleep apnea who presents to the ER via EMS for concerns of disorientation and inability to speak clearly.  Family thought she appeared more anxious than normal and patient had not taken her anxiolytics.  She lives alone.  Her last known well time is unclear.. She had talked to her daughter over the phone who was concerned that her speech did not sound right.  She called her aunt who called EMS and patient was brought to the ER for further evaluation. She denies having any weakness, no headache, no blurred vision, no difficulty swallowing, no fever, no chills, no chest pain, no shortness of breath, no dizziness, no lightheadedness, no palpitations, no diaphoresis, no urinary symptoms, no cough or any changes in her bowel habits. Labs show sodium 137, potassium 3.3 chloride 101, bicarb 24, glucose 123, BUN 12, creatinine 0.72, calcium 8.6, glucose 5885, albumin 3.7 AST 36, ALT 18, total protein 7.7, white count 12.0, hemoglobin 13.6, hematocrit 38.7, MCV 89.4, RDW 13.2, platelet count 242, PT 17, INR 1.5, Respiratory viral panel is negative CT scan of the head without contrast shows no intracranial hemorrhage Chest x-ray reviewed by me shows no acute findings Twelve-lead EKG reviewed by me shows sinus rhythm with diffuse T wave changes.   ED Course: Patient is a 70 year old African-American female who was brought into the ER by EMS for evaluation of disorientation and speech difficulties which has improved  since being in the ER.  She is noted to have swelling, redness and differential warmth involving her left leg and has stage I pressure ulcers on her buttock. She will be referred to observation status with acute stroke.     Review of Systems: As per HPI otherwise all other systems reviewed and negative.    Past Medical History:  Diagnosis Date  . A-fib ([place])   . Anemia   . B12 deficiency   . Breast cancer ([place]) 03/31/2013   Right - chemo- mastectomy  . Breast cancer ([place]) 1991   Rt.- radiation  . Complication of anesthesia 01/2009   Recent years with general anesthesia had itching following surgery  . DDD (degenerative disc disease), cervical   . Depression   . Diverticulitis   . Dyspnea   . Edema of both legs   . GERD (gastroesophageal reflux disease)   . Gout   . H/O mastectomy 2014   per patient  . H/O: cesarean section 1987   per patient report  . H/O: hysterectomy 1996   per patient  . Hydradenitis   . Hypertension   . Hypothyroidism   . Neuropathy   . 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 Parkinson's disease ([place])   . Personal history of chemotherapy   . Personal history of radiation therapy   . Ruptured cervical disc 2002   per patient   . Sleep apnea    cpap machine  . Tachycardia     Past Surgical History:  Procedure Laterality Date  . ABDOMINAL HYSTERECTOMY    . BACK SURGERY  2002  plate and 2 screws in neck  . BREAST BIOPSY Right 1991,2014   Positive  . BREAST CYST ASPIRATION Left   . BREAST SURGERY Right 2014   mastectomy  . CATARACT EXTRACTION W/PHACO Left 05/17/2019   Procedure: CATARACT EXTRACTION PHACO AND INTRAOCULAR LENS PLACEMENT (IOC);  Surgeon: Eulogio Bear, MD;  Location: [facility] ORS;  Service: Ophthalmology;  Laterality: Left;  [place] 00:28 CDE 2.15 FLUID PACK LOT # [id number]  . CATARACT EXTRACTION W/PHACO Right 06/14/2019   Procedure: CATARACT EXTRACTION PHACO AND INTRAOCULAR LENS PLACEMENT ([place]) RIGHT;  Surgeon: Eulogio Bear, MD;   Location: [facility] ORS;  Service: Ophthalmology;  Laterality: Right;  [place] 00:35.0 CDE 2.37 Fluid Pack Lot #[id number]  . CESAREAN SECTION    . CHOLECYSTECTOMY  04/09/2012  . COLONOSCOPY WITH PROPOFOL N/A 06/22/2015   Procedure: COLONOSCOPY WITH PROPOFOL;  Surgeon: Manya Silvas, MD;  Location: [facility] END[facility]COPY;  Service: Endoscopy;  Laterality: N/A;  . ESOPHAGOGASTRODUODENOSCOPY    . EYE SURGERY    . LAPAROSCOPIC GASTRIC BYPASS    . MASTECTOMY Right   . NEUROPLASTY / TRANSPOSITION ULNAR NERVE AT ELBOW    . PORT-A-CATH REMOVAL Left 05/13/2015   Procedure: REMOVAL PORT-A-CATH LEFT CHEST ;  Surgeon: Florene Glen, MD;  Location: [facility] ORS;  Service: General;  Laterality: Left;  . REDUCTION MAMMAPLASTY Left   . REVERSE SHOULDER ARTHROPLASTY Right 12/03/2020   Procedure: REVERSE SHOULDER ARTHROPLASTY;  Surgeon: Corky Mull, MD;  Location: [facility] ORS;  Service: Orthopedics;  Laterality: Right;  . ROUX-EN-Y GASTRIC BYPASS  01/26/2009  . SHOULDER ARTHROSCOPY WITH OPEN ROTATOR CUFF REPAIR Right 01/03/2017   Procedure: SHOULDER ARTHROSCOPY WITH OPEN ROTATOR CUFF REPAIR;  Surgeon: Corky Mull, MD;  Location: [facility] ORS;  Service: Orthopedics;  Laterality: Right;  . SHOULDER ARTHROSCOPY WITH SUBACROMIAL DECOMPRESSION, ROTATOR CUFF REPAIR AND BICEP TENDON REPAIR Right 01/03/2017   Procedure: SHOULDER ARTHROSCOPY WITH SUBACROMIAL DECOMPRESSION, ROTATOR CUFF REPAIR AND BICEP TENDON REPAIR;  Surgeon: Corky Mull, MD;  Location: [facility] ORS;  Service: Orthopedics;  Laterality: Right;  Limited debridement     reports that she has never smoked. She has never used smokeless tobacco. She reports that she does not drink alcohol and does not use drugs.  Allergies  Allergen Reactions  . Advair Diskus [Fluticasone-Salmeterol] Other (See Comments)    Joint pain   . Alendronate     Muscle pain  . Singulair [Montelukast] Other (See Comments)    "muscle pain"  . Sulfa Antibiotics Hives  . Venlafaxine Other (See Comments)     "altered mental status/tremors"    Family History  Problem Relation Age of Onset  . Hypertension Mother   . Lymphoma Mother   . Cancer Sister        Breast  . Breast cancer Sister 18      Prior to Admission medications   Medication Sig Start Date End Date Taking? Authorizing Provider  allopurinol (ZYLOPRIM) 300 MG tablet Take 300 mg by mouth daily.     [provider]  Calcium Carb-Cholecalciferol 600-400 MG-UNIT TABS Take 1 tablet by mouth 2 (two) times daily with a meal.    [provider]  carbidopa-levodopa (SINEMET IR) 25-100 MG tablet Take 1.5 tablets by mouth 3 (three) times daily. 06/25/20   [provider]  DULoxetine (CYMBALTA) 60 MG capsule Take 60 mg by mouth 2 (two) times daily.    [provider]  fluticasone (FLONASE) 50 MCG/ACT nasal spray Place 1 spray into both nostrils daily as  needed for allergies. Patient not taking: Reported on 12/04/2020    [provider]  furosemide (LASIX) 20 MG tablet Take 1 tablet (20 mg total) by mouth daily. 01/19/21 01/19/22  Willy Eddy, MD  gabapentin (NEURONTIN) 600 MG tablet Take 600 mg by mouth 4 (four) times daily. 03/09/15   [provider]  Homeopathic Products (PSORIASIS/ECZEMA RELIEF EX) Apply 1 application topically daily as needed (eczema).    [provider]  HYDROcodone-acetaminophen (NORCO/VICODIN) 5-325 MG tablet Take 1-2 tablets by mouth every 4 (four) hours as needed for moderate pain (pain score 4-6). 12/04/20   Anson Oregon, PA-C  levothyroxine (SYNTHROID, LEVOTHROID) 50 MCG tablet Take 50 mcg by mouth daily before breakfast.    [provider]  Magnesium 400 MG CAPS Take 400 mg by mouth daily.    [provider]  meloxicam (MOBIC) 15 MG tablet Take 15 mg by mouth daily.    [provider]  metoprolol (LOPRESSOR) 100 MG tablet Take 200 mg by mouth 2 (two) times daily.     [provider]  Multiple Vitamins tablet  Take 2 tablets by mouth daily.     [provider]  omeprazole (PRILOSEC) 20 MG capsule Take 20 mg by mouth daily.     [provider]  oxybutynin (DITROPAN) 5 MG tablet Take 5 mg by mouth 2 (two) times daily.    [provider]  potassium chloride (K-DUR,KLOR-CON) 10 MEQ tablet Take 10 mEq by mouth 3 (three) times daily.     [provider]  traMADol (ULTRAM) 50 MG tablet Take 1 tablet (50 mg total) by mouth every 6 (six) hours as needed for moderate pain. 12/04/20   Anson Oregon, PA-C  triamcinolone (KENALOG) 0.025 % cream Apply 1 application topically 2 (two) times daily. 11/06/20   [provider]  vitamin B-12 (CYANOCOBALAMIN) 1000 MCG tablet Take 1,000 mcg by mouth daily.    [provider]  warfarin (COUMADIN) 1 MG tablet Take 1 mg by mouth at bedtime. Take with 6 mg to equal 7 mg at night    [provider]  warfarin (COUMADIN) 6 MG tablet Take 6 mg by mouth at bedtime. Take with 1 mg to equal 7 mg at night    [provider]    Physical Exam: Vitals:   02/01/21 1026 02/01/21 1230 02/01/21 1245 02/01/21 1300  BP:  (!) 189/99 (!) 183/98 (!) 184/108  Pulse:  89 92 91  Resp:  (!) 21 (!) 22 19  Temp:      TempSrc:      SpO2:  94% 96% 98%  Height: 5' (1.524 m)        Vitals:   02/01/21 1026 02/01/21 1230 02/01/21 1245 02/01/21 1300  BP:  (!) 189/99 (!) 183/98 (!) 184/108  Pulse:  89 92 91  Resp:  (!) 21 (!) 22 19  Temp:      TempSrc:      SpO2:  94% 96% 98%  Height: 5' (1.524 m)         Constitutional: Alert and oriented x 3 . Not in any apparent distress HEENT:      Head: Normocephalic and atraumatic.         Eyes: PERLA, EOMI, Conjunctivae are normal. Sclera is non-icteric.       Mouth/Throat: Mucous membranes are moist.       Neck: Supple with no signs of meningismus. Cardiovascular: Regular rate and rhythm. No murmurs, gallops, or rubs.  2+ symmetrical distal pulses are present . No JVD.   Left leg swelling with redness and differential warmth Respiratory: Respiratory effort normal .Lungs sounds clear bilaterally. No wheezes, crackles, or rhonchi.  Gastrointestinal: Soft, non tender, and non distended with positive bowel sounds.  Genitourinary: No CVA tenderness. Musculoskeletal: , Extremity with swelling and deformity Neurologic:  Face is symmetric. Moving all extremities. No gross focal neurologic deficits able to move all extremities. Skin: Skin is warm, dry.  Stage I pressure injury over buttocks Psychiatric: Mood and affect are normal   Labs on Admission: I have personally reviewed following labs and imaging studies  CBC: Recent Labs  Lab 02/01/21 1040  WBC 12.0*  NEUTROABS 7.7  HGB 13.6  HCT 38.7  MCV 89.4  PLT 287   Basic Metabolic Panel: Recent Labs  Lab 02/01/21 1040  NA 137  K 3.3*  CL 101  CO2 24  GLUCOSE 123*  BUN 12  CREATININE 0.72  CALCIUM 8.6*   GFR: CrCl cannot be calculated (Unknown ideal weight.). Liver Function Tests: Recent Labs  Lab 02/01/21 1040  AST 36  ALT 18  ALKPHOS 85  BILITOT 1.6*  PROT 7.7  ALBUMIN 3.7   No results for input(s): LIPASE, AMYLASE in the last 168 hours. No results for input(s): AMMONIA in the last 168 hours. Coagulation Profile: Recent Labs  Lab 02/01/21 1138  INR 1.5*   Cardiac Enzymes: No results for input(s): CKTOTAL, CKMB, CKMBINDEX, TROPONINI in the last 168 hours. BNP (last 3 results) No results for input(s): PROBNP in the last 8760 hours. HbA1C: No results for input(s): HGBA1C in the last 72 hours. CBG: No results for input(s): GLUCAP in the last 168 hours. Lipid Profile: No results for input(s): CHOL, HDL, LDLCALC, TRIG, CHOLHDL, LDLDIRECT in the last 72 hours. Thyroid Function Tests: No results for input(s): TSH, T4TOTAL, FREET4, T3FREE, THYROIDAB in the last 72 hours. Anemia Panel: No results for input(s): VITAMINB12, FOLATE, FERRITIN, TIBC, IRON, RETICCTPCT in the last 72  hours. Urine analysis:    Component Value Date/Time   COLORURINE YELLOW (A) 01/19/2021 1718   APPEARANCEUR HAZY (A) 01/19/2021 1718   APPEARANCEUR Hazy 05/27/2013 1107   LABSPEC 1.009 01/19/2021 1718   LABSPEC 1.011 05/27/2013 1107   PHURINE 7.0 01/19/2021 1718   GLUCOSEU NEGATIVE 01/19/2021 1718   GLUCOSEU Negative 05/27/2013 1107   HGBUR MODERATE (A) 01/19/2021 1718   BILIRUBINUR NEGATIVE 01/19/2021 1718   BILIRUBINUR Negative 05/27/2013 1107   [place] 01/19/2021 1718   PROTEINUR NEGATIVE 01/19/2021 1718   NITRITE NEGATIVE 01/19/2021 1718   LEUKOCYTESUR MODERATE (A) 01/19/2021 1718   LEUKOCYTESUR 1+ 05/27/2013 1107    Radiological Exams on Admission: CT HEAD WO CONTRAST  Result Date: 02/01/2021 CLINICAL DATA:  Difficulty speaking. EXAM: CT HEAD WITHOUT CONTRAST TECHNIQUE: Contiguous axial images were obtained from the base of the skull through the vertex without intravenous contrast. COMPARISON:  June 25, 2018. FINDINGS: Brain: No evidence of acute infarction, hemorrhage, hydrocephalus, extra-axial collection or mass lesion/mass effect. Vascular: No hyperdense vessel or unexpected calcification. Skull: Normal. Negative for fracture or focal lesion. Sinuses/Orbits: No acute finding. Other: None. IMPRESSION: No acute intracranial abnormality seen. Electronically Signed   By: Marijo Conception M.D.   On: 02/01/2021 11:59   DG Chest Portable 1 View  Result Date: 02/01/2021 CLINICAL DATA:  Shortness of breath EXAM: PORTABLE CHEST 1 VIEW COMPARISON:  01/19/2021 FINDINGS: Cardiomegaly and aortic tortuosity. There is no edema, consolidation, effusion, or pneumothorax. Glenohumeral arthroplasty on the right. IMPRESSION:  Stable compared to prior.  No acute finding. Electronically Signed   By: Monte Fantasia M.D.   On: 02/01/2021 11:16     Assessment/Plan Principal Problem:   CVA (cerebral vascular accident) ([place]) Active Problems:   AF (paroxysmal atrial fibrillation) ([place])    Hypertension   History of breast cancer in female   Cellulitis   Hypothyroidism   Left leg cellulitis     Rule out CVA In a patient with a history of paroxysmal A. fib on chronic anticoagulation therapy with subtherapeutic INR as well as history of hypertension. CT scan of the head without contrast is negative for hemorrhage Obtain MRI of the brain without contrast to rule out an acute stroke Obtain 2D echocardiogram to assess LVEF and rule out cardiac thrombus Continue scheduled Coumadin We will request PT/ST/OT consult Allow for permissive hypertension until an acute stroke is ruled out     Paroxysmal atrial fibrillation Continue Coumadin as primary prophylaxis for an acute stroke INR is subtherapeutic Obtain daily PT/INR    Hypothyroidism Continue Synthroid    Left leg cellulitis Patient has redness, swelling and differential warmth involving her left lower extremity. She is afebrile and has no leukocytosis Keep left leg elevated Start patient empirically on cephalexin 500 mg p.o. every 6 hours for 5 days    Parkinson's disease Continue Sinemet    Hypokalemia Secondary to diuretic use Supplement potassium   DVT prophylaxis: Warfarin Code Status: full code Family Communication: Greater than 50% of time was spent discussing patient's condition and plan of care with her and her sister at the bedside.  All questions and concerns have been addressed.  They verbalized understanding and agree with the plan Disposition Plan: Back to previous home environment Consults called: Neurology Status: Observation    Troyce Febo MD [facility]     02/01/2021, 3:00 PM

## 2021-02-01 NOTE — ED Notes (Addendum)
Pt to toilet with one person assist. When returned to bed, stated her bottom was sore. Rolled to assess. Pt has two small bed sores about the size of a dime on each cheek through top layer of skin.

## 2021-02-02 ENCOUNTER — Observation Stay: Payer: Medicare HMO

## 2021-02-02 ENCOUNTER — Observation Stay
Admit: 2021-02-02 | Discharge: 2021-02-02 | Disposition: A | Discharge status: Home / Self Care | Payer: Medicare HMO | Attending: Internal Medicine | Admitting: Internal Medicine

## 2021-02-02 DIAGNOSIS — R000 Tachycardia, unspecified: Secondary | ICD-10-CM

## 2021-02-02 DIAGNOSIS — I480 Paroxysmal atrial fibrillation: Secondary | ICD-10-CM | POA: Diagnosis not present

## 2021-02-02 LAB — ECHOCARDIOGRAM COMPLETE
AR max vel: 2.36 cm2
AV Area VTI: 3.09 cm2
AV Area mean vel: 2.46 cm2
AV Mean grad: 4 mmHg
AV Peak grad: 6.9 mmHg
Ao pk vel: 1.31 m/s
Area-P 1/2: 4.63 cm2
Height: 60 in
S' Lateral: 2.14 cm

## 2021-02-02 LAB — CBC WITH DIFFERENTIAL/PLATELET
Abs Immature Granulocytes: 0.02 10*3/uL (ref 0.00–0.07)
Basophils Absolute: 0 10*3/uL (ref 0.0–0.1)
Basophils Relative: 0 %
Eosinophils Absolute: 0.1 10*3/uL (ref 0.0–0.5)
Eosinophils Relative: 1 %
HCT: 36.3 % (ref 36.0–46.0)
Hemoglobin: 12.4 g/dL (ref 12.0–15.0)
Immature Granulocytes: 0 %
Lymphocytes Relative: 29 %
Lymphs Abs: 2.4 10*3/uL (ref 0.7–4.0)
MCH: 31 pg (ref 26.0–34.0)
MCHC: 34.2 g/dL (ref 30.0–36.0)
MCV: 90.8 fL (ref 80.0–100.0)
Monocytes Absolute: 1.1 10*3/uL — ABNORMAL HIGH (ref 0.1–1.0)
Monocytes Relative: 13 %
Neutro Abs: 4.8 10*3/uL (ref 1.7–7.7)
Neutrophils Relative %: 57 %
Platelets: 217 10*3/uL (ref 150–400)
RBC: 4 MIL/uL (ref 3.87–5.11)
RDW: 13.3 % (ref 11.5–15.5)
WBC: 8.5 10*3/uL (ref 4.0–10.5)
nRBC: 0 % (ref 0.0–0.2)

## 2021-02-02 LAB — COMPREHENSIVE METABOLIC PANEL
ALT: 14 U/L (ref 0–44)
AST: 24 U/L (ref 15–41)
Albumin: 3.2 g/dL — ABNORMAL LOW (ref 3.5–5.0)
Alkaline Phosphatase: 74 U/L (ref 38–126)
Anion gap: 10 (ref 5–15)
BUN: 7 mg/dL — ABNORMAL LOW (ref 8–23)
CO2: 25 mmol/L (ref 22–32)
Calcium: 8.2 mg/dL — ABNORMAL LOW (ref 8.9–10.3)
Chloride: 103 mmol/L (ref 98–111)
Creatinine, Ser: 0.6 mg/dL (ref 0.44–1.00)
GFR, Estimated: >60 mL/min (ref >60)
Glucose, Bld: 100 mg/dL — ABNORMAL HIGH (ref 70–99)
Potassium: 3.3 mmol/L — ABNORMAL LOW (ref 3.5–5.1)
Sodium: 138 mmol/L (ref 135–145)
Total Bilirubin: 1 mg/dL (ref 0.3–1.2)
Total Protein: 7.1 g/dL (ref 6.5–8.1)

## 2021-02-02 LAB — LIPID PANEL
Cholesterol: 134 mg/dL (ref 0–200)
HDL: 38 mg/dL — ABNORMAL LOW (ref >40)
LDL Cholesterol: 81 mg/dL (ref 0–99)
Total CHOL/HDL Ratio: 3.5 RATIO
Triglycerides: 76 mg/dL (ref <150)
VLDL: 15 mg/dL (ref 0–40)

## 2021-02-02 LAB — GLUCOSE, CAPILLARY: Glucose-Capillary: 198 mg/dL — ABNORMAL HIGH (ref 70–99)

## 2021-02-02 LAB — PROTIME-INR
INR: 1.4 — ABNORMAL HIGH (ref 0.8–1.2)
Prothrombin Time: 17 seconds — ABNORMAL HIGH (ref 11.4–15.2)

## 2021-02-02 LAB — HEMOGLOBIN A1C
Hgb A1c MFr Bld: 5.7 % — ABNORMAL HIGH (ref 4.8–5.6)
Mean Plasma Glucose: 116.89 mg/dL

## 2021-02-02 MED ORDER — METOPROLOL TARTRATE 50 MG PO TABS: 100.0000 mg | ORAL_TABLET | Freq: Two times a day (BID) | ORAL | Status: DC

## 2021-02-02 MED ORDER — METOPROLOL TARTRATE 50 MG PO TABS
200.0000 mg | ORAL_TABLET | Freq: Two times a day (BID) | ORAL | Status: DC
  Administered 2021-02-02: 14:00:00 200 mg via ORAL
  Filled 2021-02-02: qty 4

## 2021-02-02 MED ORDER — WARFARIN SODIUM 6 MG PO TABS
7.0000 mg | ORAL_TABLET | Freq: Once | ORAL | Status: AC
  Administered 2021-02-02: 7 mg via ORAL
  Filled 2021-02-02: qty 1

## 2021-02-02 MED ORDER — CEPHALEXIN 500 MG PO CAPS: 500.0000 mg | ORAL_CAPSULE | Freq: Four times a day (QID) | ORAL | 0 refills | Status: AC

## 2021-02-02 MED ORDER — WARFARIN - PHARMACIST DOSING INPATIENT: Freq: Every day | Status: DC

## 2021-02-02 NOTE — Evaluation (Signed)
Occupational Therapy Evaluation Patient Details Name: Suzanne Dixon MRN: [id number] DOB: 26-Dec-1950 Today's Date: 02/02/2021    History of Present Illness 70 y.o. female with medical history significant for A. fib on chronic anticoagulation with Coumadin, hypertension, Parkinson's disease, history of breast cancer, depression, B12 deficiency, hypothyroidism and sleep apnea who presents to the ER via EMS for concerns of disorientation and inability to speak clearly.  Family thought she appeared more anxious than normal and patient had not taken her anxiolytics.  She lives alone.  Her last known well time is unclear..   Clinical Impression   Patient presenting with decreased I in self care, balance, functional mobility/transfers, endurance, and safety awareness.  Patient reports being mod I at home with use of AD for functional mobility PTA. Pt lives alone and reports using [facility] for mobility within the home and RW outside of the home. She does still drive. An aide comes 1x/wk to assist her with safely getting into the shower and she reports getting HHOT secondary to 8 wks post R total shoulder surgery. Pt reports being close to baseline this session Patient currently functioning supervision - min A with HHA. Pt with elevated HR to 160's with standing at sink for grooming and therefore limited mobility this session. Transport arrives at end of session.  Patient will benefit from acute OT to increase overall independence in the areas of ADLs, functional mobility, and safety awareness in order to safely discharge home.    Follow Up Recommendations  Home health OT;Supervision - Intermittent    Equipment Recommendations  None recommended by OT       Precautions / Restrictions Precautions Precautions: Fall Precaution Comments: elevated HR Restrictions Other Position/Activity Restrictions: R TSA on 3/17 ( 8 weeks out) per pt at 6 wk appt no longer has to wear sling and no ROM restrictions.       Mobility Bed Mobility Overal bed mobility: Needs Assistance Bed Mobility: Supine to Sit;Sit to Supine     Supine to sit: Min assist;HOB elevated Sit to supine: Mod assist;HOB elevated   General bed mobility comments: assistance needed for L LE supine >EOB and assist for B LEs sit >supine    Transfers Overall transfer level: Needs assistance Equipment used: 1 person hand held assist Transfers: Sit to/from [facility] Sit to Stand: Min assist Stand pivot transfers: Min assist       General transfer comment: min A to boost self into standing from standard height bed    Balance Overall balance assessment: Needs assistance   Sitting balance-Leahy Scale: Good     Standing balance support: No upper extremity supported;During functional activity Standing balance-Leahy Scale: Fair                             ADL either performed or assessed with clinical judgement   ADL Overall ADL's : Needs assistance/impaired     Grooming: Wash/dry hands;Wash/dry face;Oral care;Standing;Sitting;Supervision/safety;Set up                                       Vision Patient Visual Report: No change from baseline              Pertinent Vitals/Pain Pain Assessment: No/denies pain     Hand Dominance Left   Extremity/Trunk Assessment Upper Extremity Assessment Upper Extremity Assessment: Generalized weakness;RUE deficits/detail RUE Deficits / Details:  8 wks post TSA has been seeing [place] therapy for "exercises" per pt report. She is utilizing in functional manner this session but is limited   Lower Extremity Assessment Lower Extremity Assessment: LLE deficits/detail LLE Deficits / Details: noted to have significant edema and hot to the touch. RN notified.       Communication Communication Communication: No difficulties   Cognition Arousal/Alertness: Awake/alert Behavior During Therapy: WFL for tasks assessed/performed Overall  Cognitive Status: Within Functional Limits for tasks assessed                                 General Comments: Pt did have difficulty with dates when asked about total shoulder surgery. No other issues noted during session. SLP notified.              Home Living Family/patient expects to be discharged to:: Private residence   Available Help at Discharge: Family;Other (Comment);Available PRN/intermittently;Personal care attendant Type of Home: House Home Access: Ramped entrance     Home Layout: One level     Bathroom Shower/Tub: Occupational psychologist: Handicapped height     Home Equipment: Grab bars - toilet;Grab bars - tub/shower;Bedside commode;Walker - 2 wheels;Walker - 4 wheels;Cane - quad   Additional Comments: She has an aide that come 1x/wk to assist with shower.      Prior Functioning/Environment Level of Independence: Independent with assistive device(s)        Comments: Pt using small base cane for mobility, indep with ADL, housekeeping 2x/wk, pt reports some difficulty with heavy meal prep 2/2 fatigue, endorses some difficulty with med mgt after health insurance switch no longer provided her with blister packs        OT Problem List: Decreased strength;Impaired balance (sitting and/or standing);Decreased cognition;Decreased safety awareness;Cardiopulmonary status limiting activity;Decreased activity tolerance;Decreased knowledge of use of DME or AE      OT Treatment/Interventions: Self-care/ADL training;DME and/or AE instruction;Therapeutic exercise;Manual therapy;Energy conservation;Splinting;Therapeutic activities;Balance training;Modalities    OT Goals(Current goals can be found in the care plan section) Acute Rehab OT Goals Patient Stated Goal: to go home OT Goal Formulation: With patient Time For Goal Achievement: 02/16/21 Potential to Achieve Goals: Good  OT Frequency: Min 2X/week   Barriers to D/C:    none known at this  time          AM-PAC OT "6 Clicks" Daily Activity     Outcome Measure Help from another person eating meals?: None Help from another person taking care of personal grooming?: A Little Help from another person toileting, which includes using toliet, bedpan, or urinal?: A Little Help from another person bathing (including washing, rinsing, drying)?: A Little Help from another person to put on and taking off regular upper body clothing?: None Help from another person to put on and taking off regular lower body clothing?: A Little 6 Click Score: 20   End of Session Nurse Communication: Mobility status  Activity Tolerance: Patient tolerated treatment well Patient left: in bed;with call bell/phone within reach;Other (comment) (transport arrived)  OT Visit Diagnosis: Unsteadiness on feet (R26.81);Muscle weakness (generalized) (M62.81)                Time: 5427-0623 OT Time Calculation (min): 23 min Charges:  OT General Charges $OT Visit: 1 Visit OT Evaluation $OT Eval Moderate Complexity: 1 Mod OT Treatments $Self Care/Home Management : 8-22 mins   Darleen Crocker, MS, OTR/L , CBIS ascom  [phone]  02/02/21, 12:28 PM   02/02/2021, 11:31 AM

## 2021-02-02 NOTE — Discharge Summary (Addendum)
Physician Discharge Summary  Suzanne Dixon [id number]:[id number] DOB: 1951/09/10 DOA: 02/01/2021  PCP: Adin Hector, MD  Admit date: 02/01/2021 Discharge date: 02/02/2021  Time spent: 47 minutes minutes  Recommendations for Outpatient Follow-up:  1. Needs outpatient work-up if confusion persists 2. Need Chem-12 CBC 1 to 2 weeks at outpatient setting 3. Needs weight loss counseling and discussion about her A1c of 5.7 by her MD-May benefit from adjunctive saxagliptin or metformin for off label weight loss 4. Resume all meds without change and follow-up with cardiology in the outpatient regarding reinitiation of Lasix given lower extremity swelling  Discharge Diagnoses:  MAIN problem for hospitalization   TIA ruled out-transient confusional state  Please see below for itemized issues addressed in HOpsital- refer to other progress notes for clarity if needed  Discharge Condition: improved  Diet recommendation: Heart healthy diabetic  There were no vitals filed for this visit.  History of present illness:  70 black female lives alone at home Paroxysmal A. fib [place] >4/Coumadin Breast cancer status 1991-recurrence 03/2013-S/P post chemo mastectomy History of gastric outlet procedure in 2015 with weight loss down from 400 pounds Paget's disease of bone B12 deficiency, gout, hidradenitis Parkinson's [?] OSA on CPAP? Recent reverse arthroplasty shoulder 12/03/2020  70 5/3 to ED with lower extremity swelling rash +?  Cystitis + mild lower extremity pitting edema-x-ray at the time no pulmonary edema-Rx Macrobid Lasix and DC home with instructions to follow-up with her cardiologist Dr. Ubaldo Glassing  Represent 5/16 [facility] ED-daughter noticed mild speech deficits on telephone-could not give a history to [place]  Neurology consulted-notes that she seems more anxious than normal to family had not taken her anxiety meds recently  Potassium 3.3 BUN/creatinine 12/0.7 LFTs WNL White count 12 Platelet  240  CT head no hemorrhage-CXR negative  She was observed overnight in the hospital MRI was obtained that showed chronic tiny areas that could be infarcts and microvascular disease consistent with her history of A. fib Noted to be transiently tachycardic secondary to beta-blocker withdrawal (beta-blocker had been held on admission) She stabilized from the perspective of PT to be able to ambulate around the unit with her walker and we resumed home health on discharge  She will need follow-up with Dr. Ubaldo Glassing in the outpatient setting for consideration of 30-day event monitor although it is unlikely that this was a stroke and I do not think it was a TIA   On discharge her neurologic exam was completely nonfocal-it was felt that she probably had a transient episode of this from her recent antibiotic and Lasix use but she will probably need event monitor as above  I have also extended her course of antibiotics from several weeks ago to Keflex for 20 more doses/5 days because she still has redness which is much improved and her left lower extremity-to be clear she DOES NOT have sepsis from her cellulitis  She had met maximal hospital benefit after all imaging studies including echo MRI etc. had been performed and she was stabilized for discharge on 5/17  Discharge Exam: Vitals:   02/02/21 1455 02/02/21 1506  BP: (!) 108/59 (!) 126/92  Pulse: (!) 124 89  Resp: (!) 26 (!) 24  Temp:    SpO2: 100% 100%    Subj on day of d/c   Pleasant coherent alert smile symmetric eating drinking no chest pain no fever no chills  General Exam on discharge  EOMI NCAT smile symmetric shoulder shrug bilaterally symmetric uvula midline Master intact S1-S2 no  murmur sinus on monitors although sinus tach earlier today Power 5/5 in upper extremities although weakness secondary to right shoulder surgery Trace lower extremity edema on right side significant lower extremity on left side but this is improved per  patient (patient had duplex of the leg on 5/2 which was negative) Power in both lower extremities is intact 5/5 Babinski upgoing gait not assessed   Discharge Instructions    Allergies as of 02/02/2021      Reactions   Advair Diskus [fluticasone-salmeterol] Other (See Comments)   Joint pain    Alendronate    Muscle pain   Singulair [montelukast] Other (See Comments)   "muscle pain"   Sulfa Antibiotics Hives   Venlafaxine Other (See Comments)   "altered mental status/tremors"      Medication List    STOP taking these medications   furosemide 20 MG tablet Commonly known as: Lasix     TAKE these medications   allopurinol 300 MG tablet Commonly known as: ZYLOPRIM Take 300 mg by mouth daily.   Calcium Carb-Cholecalciferol 600-400 MG-UNIT Tabs Take 1 tablet by mouth 2 (two) times daily with a meal.   carbidopa-levodopa 25-100 MG tablet Commonly known as: SINEMET IR Take 1.5 tablets by mouth 3 (three) times daily.   cephALEXin 500 MG capsule Commonly known as: KEFLEX Take 1 capsule (500 mg total) by mouth every 6 (six) hours for 20 doses.   DULoxetine 60 MG capsule Commonly known as: CYMBALTA Take 60 mg by mouth 2 (two) times daily.   gabapentin 600 MG tablet Commonly known as: NEURONTIN Take 600 mg by mouth 4 (four) times daily.   HYDROcodone-acetaminophen 5-325 MG tablet Commonly known as: NORCO/VICODIN Take 1-2 tablets by mouth every 4 (four) hours as needed for moderate pain (pain score 4-6).   levothyroxine 50 MCG tablet Commonly known as: SYNTHROID Take 50 mcg by mouth daily before breakfast.   Magnesium 400 MG Caps Take 400 mg by mouth daily.   meloxicam 15 MG tablet Commonly known as: MOBIC Take 15 mg by mouth daily.   metoprolol tartrate 100 MG tablet Commonly known as: LOPRESSOR Take 200 mg by mouth 2 (two) times daily.   Multiple Vitamins tablet Take 2 tablets by mouth daily.   omeprazole 20 MG capsule Commonly known as: PRILOSEC Take 20  mg by mouth daily.   oxybutynin 5 MG tablet Commonly known as: DITROPAN Take 5 mg by mouth 2 (two) times daily.   potassium chloride 10 MEQ tablet Commonly known as: KLOR-CON Take 10 mEq by mouth 3 (three) times daily.   PSORIASIS/ECZEMA RELIEF EX Apply 1 application topically daily as needed (eczema).   traMADol 50 MG tablet Commonly known as: ULTRAM Take 1 tablet (50 mg total) by mouth every 6 (six) hours as needed for moderate pain.   triamcinolone 0.025 % cream Commonly known as: KENALOG Apply 1 application topically 2 (two) times daily.   vitamin B-12 1000 MCG tablet Commonly known as: CYANOCOBALAMIN Take 1,000 mcg by mouth daily.   warfarin 6 MG tablet Commonly known as: COUMADIN Take 6 mg by mouth at bedtime. (take with 95m tablet to equal 752mtotal)   warfarin 1 MG tablet Commonly known as: COUMADIN Take 1 mg by mouth at bedtime. (take with 81m72mablet to equal 7mg5mtal)      Allergies  Allergen Reactions  . Advair Diskus [Fluticasone-Salmeterol] Other (See Comments)    Joint pain   . Alendronate     Muscle pain  . Singulair [Montelukast]  Other (See Comments)    "muscle pain"  . Sulfa Antibiotics Hives  . Venlafaxine Other (See Comments)    "altered mental status/tremors"      The results of significant diagnostics from this hospitalization (including imaging, microbiology, ancillary and laboratory) are listed below for reference.    Significant Diagnostic Studies: DG Chest 1 View  Result Date: 01/19/2021 CLINICAL DATA:  Leg swelling for 2 weeks.  Hypertension. EXAM: CHEST  1 VIEW COMPARISON:  CT chest 06/11/2014 FINDINGS: Reverse right total shoulder joint prosthesis. Lower cervical plate and screw fixator. Prominent degenerative left glenohumeral arthropathy. Bilateral degenerative AC joint arthropathy. Thoracic spondylosis. The lungs appear clear. No blunting of the costophrenic angles. Borderline enlargement of the cardiopericardial silhouette,  without edema. IMPRESSION: 1. Borderline enlargement of the cardiopericardial silhouette, without edema. 2. Degenerative arthropathy of the AC joints and left glenohumeral joint. Right total shoulder joint prosthesis. Electronically Signed   By: Van Clines M.D.   On: 01/19/2021 14:33   CT HEAD WO CONTRAST  Result Date: 02/01/2021 CLINICAL DATA:  Difficulty speaking. EXAM: CT HEAD WITHOUT CONTRAST TECHNIQUE: Contiguous axial images were obtained from the base of the skull through the vertex without intravenous contrast. COMPARISON:  June 25, 2018. FINDINGS: Brain: No evidence of acute infarction, hemorrhage, hydrocephalus, extra-axial collection or mass lesion/mass effect. Vascular: No hyperdense vessel or unexpected calcification. Skull: Normal. Negative for fracture or focal lesion. Sinuses/Orbits: No acute finding. Other: None. IMPRESSION: No acute intracranial abnormality seen. Electronically Signed   By: Marijo Conception M.D.   On: 02/01/2021 11:59   MR BRAIN WO CONTRAST  Result Date: 02/02/2021 CLINICAL DATA:  Provided history: Stroke, follow-up. EXAM: MRI HEAD WITHOUT CONTRAST TECHNIQUE: Multiplanar, multiecho pulse sequences of the brain and surrounding structures were obtained without intravenous contrast. COMPARISON:  Prior head CT examinations 02/01/2021 and earlier. Brain MRI 03/26/2010. FINDINGS: Brain: Cerebral volume is normal for age. Mild multifocal T2/FLAIR hyperintensity within the cerebral white matter and pons, nonspecific but compatible with chronic small vessel ischemic disease. Additionally, a small chronic lacunar infarct is questioned within the central pons. Tiny chronic lacunar infarct within the right cerebellum (series 10, image 8). There is no acute infarct. No evidence of intracranial mass. No chronic intracranial blood products. No extra-axial fluid collection. No midline shift. Vascular: Expected proximal arterial flow voids. Skull and upper cervical spine: No focal  marrow lesion. Incompletely assessed cervical spondylosis. Sinuses/Orbits: Visualized orbits show no acute finding. Bilateral lens replacements. IMPRESSION: No evidence of acute intracranial abnormality. Mild chronic small vessel ischemic changes within the cerebral white matter and pons, which have developed since the brain MRI of 03/26/2010. A small chronic lacunar infarct is questioned within the central pons. Additionally, there is a tiny chronic lacunar infarct within the right cerebellum. These are new findings as compared to the prior MRI. Electronically Signed   By: Kellie Simmering DO   On: 02/02/2021 12:41   US Venous Img Lower Bilateral  Result Date: 01/19/2021 CLINICAL DATA:  Bilateral lower extremity pain and swelling EXAM: BILATERAL LOWER EXTREMITY VENOUS DOPPLER ULTRASOUND TECHNIQUE: Gray-scale sonography with graded compression, as well as color Doppler and duplex ultrasound were performed to evaluate the lower extremity deep venous systems from the level of the common femoral vein and including the common femoral, femoral, profunda femoral, popliteal and calf veins including the posterior tibial, peroneal and gastrocnemius veins when visible. The superficial great saphenous vein was also interrogated. Spectral Doppler was utilized to evaluate flow at rest and with distal augmentation maneuvers  in the common femoral, femoral and popliteal veins. COMPARISON:  None. FINDINGS: RIGHT LOWER EXTREMITY Common Femoral Vein: No evidence of thrombus. Normal compressibility, respiratory phasicity and response to augmentation. Saphenofemoral Junction: No evidence of thrombus. Normal compressibility and flow on color Doppler imaging. Profunda Femoral Vein: No evidence of thrombus. Normal compressibility and flow on color Doppler imaging. Femoral Vein: No evidence of thrombus. Normal compressibility, respiratory phasicity and response to augmentation. Popliteal Vein: No evidence of thrombus. Normal compressibility,  respiratory phasicity and response to augmentation. Calf Veins: No evidence of thrombus. Normal compressibility and flow on color Doppler imaging. Superficial Great Saphenous Vein: No evidence of thrombus. Normal compressibility. Venous Reflux:  None. Other Findings:  None. LEFT LOWER EXTREMITY Common Femoral Vein: No evidence of thrombus. Normal compressibility, respiratory phasicity and response to augmentation. Saphenofemoral Junction: No evidence of thrombus. Normal compressibility and flow on color Doppler imaging. Profunda Femoral Vein: No evidence of thrombus. Normal compressibility and flow on color Doppler imaging. Femoral Vein: No evidence of thrombus. Normal compressibility, respiratory phasicity and response to augmentation. Popliteal Vein: No evidence of thrombus. Normal compressibility, respiratory phasicity and response to augmentation. Calf Veins: No evidence of thrombus. Normal compressibility and flow on color Doppler imaging. Superficial Great Saphenous Vein: No evidence of thrombus. Normal compressibility. Venous Reflux:  None. Other Findings:  None. IMPRESSION: No evidence of deep venous thrombosis in either lower extremity. Electronically Signed   By: Inez Catalina M.D.   On: 01/19/2021 16:58   DG Chest Portable 1 View  Result Date: 02/01/2021 CLINICAL DATA:  Shortness of breath EXAM: PORTABLE CHEST 1 VIEW COMPARISON:  01/19/2021 FINDINGS: Cardiomegaly and aortic tortuosity. There is no edema, consolidation, effusion, or pneumothorax. Glenohumeral arthroplasty on the right. IMPRESSION: Stable compared to prior.  No acute finding. Electronically Signed   By: Monte Fantasia M.D.   On: 02/01/2021 11:16   ECHOCARDIOGRAM COMPLETE  Result Date: 02/02/2021    ECHOCARDIOGRAM REPORT   Patient Name:   ZARIYAH STEPHENS Date of Exam: 02/02/2021 Medical Rec #:  [id number]      Height:       60.0 in Accession #:    [id number]     Weight:       287.0 lb Date of Birth:  Jun 23, 1951       BSA:          2.176  m Patient Age:    7 years       BP:           144/76 mmHg Patient Gender: F              HR:           102 bpm. Exam Location:  [facility] Procedure: 2D Echo, Cardiac Doppler and Color Doppler Indications:     Stroke I63.9  History:         Patient has no prior history of Echocardiogram examinations.                  Risk Factors:Hypertension and Sleep Apnea. Personal history of                  radiation therapy.  Sonographer:     Sherrie Sport RDCS (AE) Referring Phys:  [id number] Royce Macadamia AGBATA Diagnosing Phys: Bartholome Bill MD IMPRESSIONS  1. Left ventricular ejection fraction, by estimation, is 60 to 65%. The left ventricle has normal function. The left ventricle has no regional wall motion abnormalities. There is moderate left ventricular hypertrophy. Left ventricular diastolic  parameters are consistent with Grade I diastolic dysfunction (impaired relaxation).  2. Right ventricular systolic function is normal. The right ventricular size is normal.  3. The mitral valve is grossly normal. Trivial mitral valve regurgitation.  4. The aortic valve is grossly normal. Aortic valve regurgitation is trivial. FINDINGS  Left Ventricle: Left ventricular ejection fraction, by estimation, is 60 to 65%. The left ventricle has normal function. The left ventricle has no regional wall motion abnormalities. The left ventricular internal cavity size was normal in size. There is  moderate left ventricular hypertrophy. Left ventricular diastolic parameters are consistent with Grade I diastolic dysfunction (impaired relaxation). Right Ventricle: The right ventricular size is normal. No increase in right ventricular wall thickness. Right ventricular systolic function is normal. Left Atrium: Left atrial size was normal in size. Right Atrium: Right atrial size was normal in size. Pericardium: There is no evidence of pericardial effusion. Mitral Valve: The mitral valve is grossly normal. Trivial mitral valve regurgitation. Tricuspid Valve: The  tricuspid valve is grossly normal. Tricuspid valve regurgitation is trivial. Aortic Valve: The aortic valve is grossly normal. Aortic valve regurgitation is trivial. Aortic valve mean gradient measures 4.0 mmHg. Aortic valve peak gradient measures 6.9 mmHg. Aortic valve area, by VTI measures 3.09 cm. Pulmonic Valve: The pulmonic valve was not assessed. Pulmonic valve regurgitation is not visualized. Aorta: The aortic root is normal in size and structure. IAS/Shunts: The interatrial septum was not assessed.  LEFT VENTRICLE PLAX 2D LVIDd:         3.25 cm  Diastology LVIDs:         2.14 cm  LV e' medial:    2.61 cm/s LV PW:         1.51 cm  LV E/e' medial:  23.5 LV IVS:        1.11 cm  LV e' lateral:   12.90 cm/s LVOT diam:     2.00 cm  LV E/e' lateral: 4.8 LV SV:         56 LV SV Index:   26 LVOT Area:     3.14 cm  RIGHT VENTRICLE RV Basal diam:  3.93 cm RV S prime:     21.30 cm/s TAPSE (M-mode): 2.7 cm LEFT ATRIUM           Index       RIGHT ATRIUM           Index LA diam:      3.70 cm 1.70 cm/m  RA Area:     13.90 cm LA Vol (A2C): 11.4 ml 5.24 ml/m  RA Volume:   33.70 ml  15.49 ml/m LA Vol (A4C): 24.5 ml 11.26 ml/m  AORTIC VALVE                   PULMONIC VALVE AV Area (Vmax):    2.36 cm    PV Vmax:        0.39 m/s AV Area (Vmean):   2.46 cm    PV Peak grad:   0.6 mmHg AV Area (VTI):     3.09 cm    RVOT Peak grad: 5 mmHg AV Vmax:           131.00 cm/s AV Vmean:          86.700 cm/s AV VTI:            0.180 m AV Peak Grad:      6.9 mmHg AV Mean Grad:      4.0 mmHg LVOT  Vmax:         98.60 cm/s LVOT Vmean:        68.000 cm/s LVOT VTI:          0.177 m LVOT/AV VTI ratio: 0.98  AORTA Ao Root diam: 2.90 cm MITRAL VALVE                TRICUSPID VALVE MV Area (PHT): 4.63 cm     TR Peak grad:   37.9 mmHg MV Decel Time: 164 msec     TR Vmax:        308.00 cm/s MV E velocity: 61.30 cm/s MV A velocity: 125.00 cm/s  SHUNTS MV E/A ratio:  0.49         Systemic VTI:  0.18 m                             Systemic Diam:  2.00 cm Bartholome Bill MD Electronically signed by Bartholome Bill MD Signature Date/Time: 02/02/2021/12:14:08 PM    Final     Microbiology: Recent Results (from the past 240 hour(s))  Resp Panel by RT-PCR (Flu A&B, Covid) Nasopharyngeal Swab     Status: None   Collection Time: 02/01/21 11:30 AM   Specimen: Nasopharyngeal Swab; Nasopharyngeal(NP) swabs in vial transport medium  Result Value Ref Range Status   SARS Coronavirus 2 by RT PCR NEGATIVE NEGATIVE Final    Comment: (NOTE) SARS-CoV-2 target nucleic acids are NOT DETECTED.  The SARS-CoV-2 RNA is generally detectable in upper respiratory specimens during the acute phase of infection. The lowest concentration of SARS-CoV-2 viral copies this assay can detect is 138 copies/mL. A negative result does not preclude SARS-Cov-2 infection and should not be used as the sole basis for treatment or other patient management decisions. A negative result may occur with  improper specimen collection/handling, submission of specimen other than nasopharyngeal swab, presence of viral mutation(s) within the areas targeted by this assay, and inadequate number of viral copies(<138 copies/mL). A negative result must be combined with clinical observations, patient history, and epidemiological information. The expected result is Negative.  Fact Sheet for Patients:  EntrepreneurPulse.com.au  Fact Sheet for Healthcare Providers:  IncredibleEmployment.be  This test is no t yet approved or cleared by the [place] FDA and  has been authorized for detection and/or diagnosis of SARS-CoV-2 by FDA under an Emergency Use Authorization (EUA). This EUA will remain  in effect (meaning this test can be used) for the duration of the COVID-19 declaration under Section 564(b)(1) of the Act, 21 U.S.C.section 360bbb-3(b)(1), unless the authorization is terminated  or revoked sooner.       Influenza A by PCR NEGATIVE NEGATIVE  Final   Influenza B by PCR NEGATIVE NEGATIVE Final    Comment: (NOTE) The Xpert Xpress SARS-CoV-2/FLU/RSV plus assay is intended as an aid in the diagnosis of influenza from Nasopharyngeal swab specimens and should not be used as a sole basis for treatment. Nasal washings and aspirates are unacceptable for Xpert Xpress SARS-CoV-2/FLU/RSV testing.  Fact Sheet for Patients: EntrepreneurPulse.com.au  Fact Sheet for Healthcare Providers: IncredibleEmployment.be  This test is not yet approved or cleared by the [place] FDA and has been authorized for detection and/or diagnosis of SARS-CoV-2 by FDA under an Emergency Use Authorization (EUA). This EUA will remain in effect (meaning this test can be used) for the duration of the COVID-19 declaration under Section 564(b)(1) of the Act, 21 U.S.C. section 360bbb-3(b)(1), unless the authorization is  terminated or revoked.  Performed at [facility], [address] [place], [place] [address]   Culture, blood (routine x 2)     Status: None (Preliminary result)   Collection Time: 02/01/21  4:13 PM   Specimen: BLOOD  Result Value Ref Range Status   Specimen Description BLOOD BLOOD LEFT HAND  Final   Special Requests   Final    BOTTLES DRAWN AEROBIC AND ANAEROBIC Blood Culture adequate volume   Culture   Final    NO GROWTH < 24 HOURS Performed at [facility], [address], [place], [place] [address]    Report Status PENDING  Incomplete  Culture, blood (routine x 2)     Status: None (Preliminary result)   Collection Time: 02/01/21  4:27 PM   Specimen: BLOOD  Result Value Ref Range Status   Specimen Description BLOOD BLOOD LEFT ARM  Final   Special Requests   Final    BOTTLES DRAWN AEROBIC AND ANAEROBIC Blood Culture adequate volume   Culture   Final    NO GROWTH < 24 HOURS Performed at [facility], [address] [place], [place] [address]    Report Status  PENDING  Incomplete     Labs: Basic Metabolic Panel: Recent Labs  Lab 02/01/21 1040 02/02/21 0757  NA 137 138  K 3.3* 3.3*  CL 101 103  CO2 24 25  GLUCOSE 123* 100*  BUN 12 7*  CREATININE 0.72 0.60  CALCIUM 8.6* 8.2*   Liver Function Tests: Recent Labs  Lab 02/01/21 1040 02/02/21 0757  AST 36 24  ALT 18 14  ALKPHOS 85 74  BILITOT 1.6* 1.0  PROT 7.7 7.1  ALBUMIN 3.7 3.2*   No results for input(s): LIPASE, AMYLASE in the last 168 hours. No results for input(s): AMMONIA in the last 168 hours. CBC: Recent Labs  Lab 02/01/21 1040 02/02/21 0757  WBC 12.0* 8.5  NEUTROABS 7.7 4.8  HGB 13.6 12.4  HCT 38.7 36.3  MCV 89.4 90.8  PLT 242 217   Cardiac Enzymes: No results for input(s): CKTOTAL, CKMB, CKMBINDEX, TROPONINI in the last 168 hours. BNP: BNP (last 3 results) Recent Labs    01/19/21 1321  BNP 164.8*    ProBNP (last 3 results) No results for input(s): PROBNP in the last 8760 hours.  CBG: Recent Labs  Lab 02/02/21 1502  GLUCAP 198*       Signed:  Nita Sells MD   [facility] 02/02/2021, 4:38 PM

## 2021-02-02 NOTE — Significant Event (Signed)
Rapid Response Event Note   Reason for Call :  Elevated HR, red MEWS  Initial Focused Assessment:  Rapid response RN arrived in patient's room with patient lying in bed surrounded by 1C staff. Per patient's RN patient HR had been as high at in the 150s and patient had just received 200 mg of metoprolol PO at 14:28. Currently patient's HR 80's SR on monitor. 1C staff had just performed a 12 lead EKG that showed HR of 102 ST. Patient reports no pain, dizziness, or shortness of breath. States that she has not felt her heart race or skip a beat all day.  Interventions:  None.  Plan of Care:  MD at bedside discussing plan of progressive ambulation with patient and 1C staff. 1C staff instructed to recall rapid response if needed. Per MD patient takes 200 mg PO metoprolol outpatient twice a day but the medication had not been reordered until the afternoon due to stroke workup to avoid low BP.  Event Summary:   MD Notified: Dr. Verlon Au Call Time: 14:58 Arrival Time: 15:00 End Time: 15:09  Stephanie Acre, RN

## 2021-02-02 NOTE — Evaluation (Signed)
Physical Therapy Evaluation Patient Details Name: Suzanne Dixon MRN: [id number] DOB: 02/02/1951 Today's Date: 02/02/2021   History of Present Illness  70 y.o. female with medical history significant for A. fib on chronic anticoagulation with Coumadin, hypertension, Parkinson's disease, history of breast cancer, depression, B12 deficiency, hypothyroidism and sleep apnea who presents to the ER via EMS for concerns of disorientation and inability to speak clearly.  Family thought she appeared more anxious than normal and patient had not taken her anxiolytics.  She lives alone.  Her last known well time is unclear..  Clinical Impression   Pt pleasant and eager to work with PT.  She has been having issues with HR but during prolonged ambulation (~200 ft; longer than she apparently ever really walks) she was able to maintain HR <110 and O2 in the 90s.  She did not have any balance/safety issues.  Pt is 8 weeks post R total shoulder and has been having HHPT for this, she would benefit from continuing with this with some additional focus/awareness of activity tolerance as well as continued orthopedic work per TSA protocol.         Follow Up Recommendations Home health PT    Equipment Recommendations  None recommended by PT    Recommendations for Other Services       Precautions / Restrictions Precautions Precautions: Fall Restrictions Weight Bearing Restrictions: No      Mobility  Bed Mobility Overal bed mobility: Modified Independent Bed Mobility: Supine to Sit     Supine to sit: Supervision     General bed mobility comments: Pt able to get to EOB and (with cues to protect R UE) able to don socks w/o assist and w/o significant fatigue/change in HR    Transfers Overall transfer level: Modified independent Equipment used: Rolling walker (2 wheeled) Transfers: Sit to/from Stand Sit to Stand: Supervision         General transfer comment: Pt able to rise to standing with good  relative confidence, cues to insure R UE did not take much weight  Ambulation/Gait Ambulation/Gait assistance: Supervision Gait Distance (Feet): 185 Feet Assistive device: Rolling walker (2 wheeled)       General Gait Details: Pt with slow but steady gait, needing AD but not overly reliant on the walker (cues to insure R UE was not taking much WBing). Pt's HR did rise with the effort but she did not have excessive fatigue.  Her O2 remained in the high 90s and HR did not exceed 110.  Stairs            Wheelchair Mobility    Modified Rankin (Stroke Patients Only)       Balance Overall balance assessment: Modified Independent   Sitting balance-Leahy Scale: Good     Standing balance support: Bilateral upper extremity supported Standing balance-Leahy Scale: Good                               Pertinent Vitals/Pain Pain Assessment: No/denies pain    Home Living Family/patient expects to be discharged to:: Private residence Living Arrangements: Alone Available Help at Discharge: Family;Other (Comment);Available PRN/intermittently;Personal care attendant (2x/wk aide) Type of Home: House Home Access: Ramped entrance     Home Layout: One level Home Equipment: Grab bars - toilet;Grab bars - tub/shower;Bedside commode;Walker - 2 wheels;Walker - 4 wheels;Cane - quad      Prior Function Level of Independence: Independent with assistive device(s)  Comments: Pt using small base cane for mobility, indep with ADL, housekeeping 2x/wk, pt reports some difficulty with heavy meal prep 2/2 fatigue, endorses some difficulty with med mgt after health insurance switch no longer provided her with blister packs     Hand Dominance        Extremity/Trunk Assessment   Upper Extremity Assessment Upper Extremity Assessment: Overall WFL for tasks assessed (deferred R UE testing (8 wks post shld replacement))    Lower Extremity Assessment Lower Extremity  Assessment: Overall WFL for tasks assessed       Communication      Cognition Arousal/Alertness: Awake/alert Behavior During Therapy: WFL for tasks assessed/performed Overall Cognitive Status: Within Functional Limits for tasks assessed                                        General Comments General comments (skin integrity, edema, etc.): Pt has had issues with elevated HR, seems likely associated with stopping home HR meds to allow for BP during code stroke.  Had meds pre-PT.    Exercises     Assessment/Plan    PT Assessment Patient needs continued PT services  PT Problem List Decreased strength;Decreased range of motion;Decreased activity tolerance;Decreased balance;Decreased mobility;Decreased knowledge of use of DME;Decreased safety awareness;Cardiopulmonary status limiting activity       PT Treatment Interventions Gait training;DME instruction;Stair training;Functional mobility training;Therapeutic activities;Therapeutic exercise;Balance training;Neuromuscular re-education;Patient/family education    PT Goals (Current goals can be found in the Care Plan section)  Acute Rehab PT Goals Patient Stated Goal: to go home PT Goal Formulation: With patient Time For Goal Achievement: 02/16/21 Potential to Achieve Goals: Good    Frequency Min 2X/week   Barriers to discharge        Co-evaluation               AM-PAC PT "6 Clicks" Mobility  Outcome Measure Help needed turning from your back to your side while in a flat bed without using bedrails?: None Help needed moving from lying on your back to sitting on the side of a flat bed without using bedrails?: None Help needed moving to and from a bed to a chair (including a wheelchair)?: A Little Help needed standing up from a chair using your arms (e.g., wheelchair or bedside chair)?: A Little Help needed to walk in hospital room?: A Little Help needed climbing 3-5 steps with a railing? : A Lot 6 Click  Score: 19    End of Session Equipment Utilized During Treatment: Gait belt Activity Tolerance: Patient tolerated treatment well;Patient limited by fatigue Patient left: in chair;with call bell/phone within reach Nurse Communication: Mobility status (vitals during activity) PT Visit Diagnosis: Muscle weakness (generalized) (M62.81);Difficulty in walking, not elsewhere classified (R26.2)    Time: 4097-3532 PT Time Calculation (min) (ACUTE ONLY): 28 min   Charges:   PT Evaluation $PT Eval Low Complexity: 1 Low PT Treatments $Gait Training: 8-22 mins       Kreg Shropshire, DPT 02/02/2021, 5:48 PM

## 2021-02-02 NOTE — Progress Notes (Signed)
*  PRELIMINARY RESULTS* Echocardiogram 2D Echocardiogram has been performed.  Sherrie Sport 02/02/2021, 10:35 AM

## 2021-02-02 NOTE — Progress Notes (Signed)
Went through discharge paper with the patient, verbalize understanding and pt left POV with her family

## 2021-02-02 NOTE — Progress Notes (Signed)
Went to check on patient post rapid response earlier. Patient resting in chair talking on the phone with no complaints. States she is doing well. HR was 80 SR on the heart monitor.

## 2021-02-02 NOTE — Progress Notes (Signed)
Rapid response  called on patient because of red mews, rapid response team respond, assessed pt, MD came at bedside and assessed pt. Pt was alert and oriented, no change in her LOC . Pt already had her meds for elevated HR, no new order

## 2021-02-02 NOTE — Progress Notes (Signed)
ANTICOAGULATION CONSULT NOTE - Initial Consult  Pharmacy Consult for warfarin Indication: atrial fibrillation  Allergies  Allergen Reactions  . Advair Diskus [Fluticasone-Salmeterol] Other (See Comments)    Joint pain   . Alendronate     Muscle pain  . Singulair [Montelukast] Other (See Comments)    "muscle pain"  . Sulfa Antibiotics Hives  . Venlafaxine Other (See Comments)    "altered mental status/tremors"    Patient Measurements: Height: 5' (152.4 cm) IBW/kg (Calculated) : 45.5 Heparin Dosing Weight:    Vital Signs: Temp: 97.5 F (36.4 C) (05/17 0710) Temp Source: Oral (05/17 0710) BP: 144/76 (05/17 0710) Pulse Rate: 102 (05/17 0710)  Labs: Recent Labs    02/01/21 1040 02/01/21 1138 02/02/21 0555 02/02/21 0757  HGB 13.6  --   --  12.4  HCT 38.7  --   --  36.3  PLT 242  --   --  217  APTT  --  32  --   --   LABPROT  --  17.7* 17.0*  --   INR  --  1.5* 1.4*  --   CREATININE 0.72  --   --  0.60    CrCl cannot be calculated (Unknown ideal weight.).   Medical History: Past Medical History:  Diagnosis Date  . A-fib ([place])   . Anemia   . B12 deficiency   . Breast cancer ([place]) 03/31/2013   Right - chemo- mastectomy  . Breast cancer ([place]) 1991   Rt.- radiation  . Complication of anesthesia 01/2009   Recent years with general anesthesia had itching following surgery  . DDD (degenerative disc disease), cervical   . Depression   . Diverticulitis   . Dyspnea   . Edema of both legs   . GERD (gastroesophageal reflux disease)   . Gout   . H/O mastectomy 2014   per patient  . H/O: cesarean section 1987   per patient report  . H/O: hysterectomy 1996   per patient  . Hydradenitis   . Hypertension   . Hypothyroidism   . Neuropathy   . 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 Parkinson's disease ([place])   . Personal history of chemotherapy   . Personal history of radiation therapy   . Ruptured cervical disc 2002   per patient   . Sleep apnea    cpap machine   . Tachycardia     Medications:  Scheduled:  . allopurinol  300 mg Oral Daily  . carbidopa-levodopa  1.5 tablet Oral TID  . cephALEXin  500 mg Oral Q6H  . DULoxetine  60 mg Oral BID  . gabapentin  600 mg Oral QID  . levothyroxine  50 mcg Oral QAC breakfast  . multivitamin with minerals  2 tablet Oral Daily  . oxybutynin  5 mg Oral BID  . pantoprazole  40 mg Oral QAC breakfast  . triamcinolone  1 application Topical BID  . vitamin B-12  1,000 mcg Oral Daily  . warfarin  7 mg Oral Once  . Warfarin - Pharmacist Dosing Inpatient   Does not apply q1600    Assessment: 70 yo F  On Warfarin for A.fib PTA. Home warfarin dose= 7 mg daily with last dose on 01/31/21 per patient. Hgb 12.4  Plt 217  5/16 INR  1.5  No dose given per MAR (was being assesed for stroke?) 5/17 INR  1.4   Goal of Therapy:  INR 2-3 Monitor platelets by anticoagulation protocol: Yes   Plan:  Will  order Warfarin 7 mg x 1 dose for now. DDI: cephalexin, allopurinol Will f/u INR daily and CBC per protocol  Aundray Cartlidge A 02/02/2021,12:26 PM

## 2021-02-02 NOTE — TOC Transition Note (Addendum)
Transition of Care [facility]) - CM/SW Discharge Note   Patient Details  Name: TALINE NASS MRN: [id number] Date of Birth: Apr 09, 1951  [facility]) CM/SW Contact:  Pete Pelt, RN Phone Number: 02/02/2021, 3:44 PM   Clinical Narrative:   Patient being discharged today.  Lives alone, but has support of family and friends as needed.  No concerns getting to appointments, as she drives and can easily find a driver if needed.  No concerns about getting medications, she can either get to the pharmacy or they are mailed ahead of time.  Patient currently has home health, although she is unsure of what services, [place] at [facility] notifed, as patient believes that is her service.  Awaiting response..  No concerns for going home or for TOC at present.  [place] from [facility] confirmed that she would be able to take care for patient, patient made aware.  Correction:  [place] from [facility] was notified will take patient, patient does not see [facility]. Barriers to Discharge: Barriers Resolved   Patient Goals and CMS Choice Patient states their goals for this hospitalization and ongoing recovery are:: to go home   Choice offered to / list presented to : NA  Discharge Placement                       Discharge Plan and Services   Discharge Planning Services: CM Consult Post Acute Care Choice: Home Health                    HH Arranged: PT,OT   Date [facility] Agency Contacted: 02/02/21      Social Determinants of Health (SDOH) Interventions     Readmission Risk Interventions No flowsheet data found.

## 2021-02-02 NOTE — Evaluation (Deleted)
Physical Therapy Evaluation Patient Details Name: Suzanne Dixon MRN: [id number] DOB: 1951/06/02 Today's Date: 02/02/2021   History of Present Illness  70 y.o. female with medical history significant for A. fib on chronic anticoagulation with Coumadin, hypertension, Parkinson's disease, history of breast cancer, depression, B12 deficiency, hypothyroidism and sleep apnea who presents to the ER via EMS for concerns of disorientation and inability to speak clearly.  Family thought she appeared more anxious than normal and patient had not taken her anxiolytics.  She lives alone.  Her last known well time is unclear..  Clinical Impression  Pt pleasant and eager to work with PT.  She has been having issues with HR but during prolonged ambulation (~200 ft; longer than she apparently ever really walks) she was able to maintain HR <110 and O2 in the 90s.  She did not have any balance/safety issues.  Pt is 8 weeks post R total shoulder and has been having HHPT for this, she would benefit from continuing with this with some additional focus/awareness of activity tolerance as well as continued orthopedic work per TSA protocol.      Follow Up Recommendations Home health PT    Equipment Recommendations  None recommended by PT    Recommendations for Other Services       Precautions / Restrictions Precautions Precautions: Fall Restrictions Weight Bearing Restrictions: No      Mobility  Bed Mobility Overal bed mobility: Modified Independent Bed Mobility: Supine to Sit     Supine to sit: Supervision     General bed mobility comments: Pt able to get to EOB and (with cues to protect R UE) able to don socks w/o assist and w/o significant fatigue/change in HR    Transfers Overall transfer level: Modified independent Equipment used: Rolling walker (2 wheeled) Transfers: Sit to/from Stand Sit to Stand: Supervision         General transfer comment: Pt able to rise to standing with good relative  confidence, cues to insure R UE did not take much weight  Ambulation/Gait Ambulation/Gait assistance: Supervision Gait Distance (Feet): 185 Feet Assistive device: Rolling walker (2 wheeled)       General Gait Details: Pt with slow but steady gait, needing AD but not overly reliant on the walker (cues to insure R UE was not taking much WBing). Pt's HR did rise with the effort but she did not have excessive fatigue.  Her O2 remained in the high 90s and HR did not exceed 110.  Stairs            Wheelchair Mobility    Modified Rankin (Stroke Patients Only)       Balance Overall balance assessment: Modified Independent   Sitting balance-Leahy Scale: Good     Standing balance support: Bilateral upper extremity supported Standing balance-Leahy Scale: Good                               Pertinent Vitals/Pain Pain Assessment: No/denies pain    Home Living Family/patient expects to be discharged to:: Private residence Living Arrangements: Alone Available Help at Discharge: Family;Other (Comment);Available PRN/intermittently;Personal care attendant (2x/wk aide) Type of Home: House Home Access: Ramped entrance     Home Layout: One level Home Equipment: Grab bars - toilet;Grab bars - tub/shower;Bedside commode;Walker - 2 wheels;Walker - 4 wheels;Cane - quad      Prior Function Level of Independence: Independent with assistive device(s)  Comments: Pt using small base cane for mobility, indep with ADL, housekeeping 2x/wk, pt reports some difficulty with heavy meal prep 2/2 fatigue, endorses some difficulty with med mgt after health insurance switch no longer provided her with blister packs     Hand Dominance        Extremity/Trunk Assessment   Upper Extremity Assessment Upper Extremity Assessment: Overall WFL for tasks assessed (deferred R UE testing (8 wks post shld replacement))    Lower Extremity Assessment Lower Extremity Assessment:  Overall WFL for tasks assessed       Communication      Cognition Arousal/Alertness: Awake/alert Behavior During Therapy: WFL for tasks assessed/performed Overall Cognitive Status: Within Functional Limits for tasks assessed                                        General Comments General comments (skin integrity, edema, etc.): Pt has had issues with elevated HR, seems likely associated with stopping home HR meds to allow for BP during code stroke.  Had meds pre-PT.    Exercises     Assessment/Plan    PT Assessment    PT Problem List         PT Treatment Interventions      PT Goals (Current goals can be found in the Care Plan section)  Acute Rehab PT Goals Patient Stated Goal: to go home PT Goal Formulation: With patient Time For Goal Achievement: 02/16/21 Potential to Achieve Goals: Good    Frequency Min 2X/week   Barriers to discharge        Co-evaluation               AM-PAC PT "6 Clicks" Mobility  Outcome Measure Help needed turning from your back to your side while in a flat bed without using bedrails?: None Help needed moving from lying on your back to sitting on the side of a flat bed without using bedrails?: None Help needed moving to and from a bed to a chair (including a wheelchair)?: A Little Help needed standing up from a chair using your arms (e.g., wheelchair or bedside chair)?: A Little Help needed to walk in hospital room?: A Little Help needed climbing 3-5 steps with a railing? : A Lot 6 Click Score: 19    End of Session Equipment Utilized During Treatment: Gait belt Activity Tolerance: Patient tolerated treatment well;Patient limited by fatigue Patient left: in chair;with call bell/phone within reach Nurse Communication: Mobility status (vitals during activity) PT Visit Diagnosis: Muscle weakness (generalized) (M62.81);Difficulty in walking, not elsewhere classified (R26.2)    Time: 3295-1884 PT Time Calculation (min)  (ACUTE ONLY): 28 min   Charges:   PT Evaluation $PT Eval Low Complexity: 1 Low PT Treatments $Gait Training: 8-22 mins        Kreg Shropshire, DPT 02/02/2021, 5:30 PM

## 2021-02-02 NOTE — Progress Notes (Signed)
MD made aware about pt s HR LIKE 154, medication given as order by MD

## 2021-02-02 NOTE — Progress Notes (Addendum)
SLP Cancellation Note  Patient Details Name: Suzanne Dixon MRN: [id number] DOB: [birth year]   Cancelled treatment:       Reason Eval/Treat Not Completed: SLP screened, no needs identified, will sign off (chart reviewed; met w/ pt in room). Pt denied any difficulty swallowing and is currently on a regular diet; tolerates swallowing pills w/ water per NSG. Of note, she endorsed s/s of Esophageal Dysmotility describing Retrograde backflow of food material and pills "sometimes" -- "it feels like it gets stuck and I cough it back up". Pt was pointing to her Sternal Notch area and mid-sternum area. Encouraged pt to f/u w/ GI which she said she was doing w/ her PCP and "might have a drink test next month".  Addendum: per chart notes, pt had a DG Esophagus in 2019 which revealed: "No definite hiatal hernia was demonstrated but incidental note is made of prominent cricopharyngeus impression on the esophagus.". This may be the impact that pt is feeling/describing currently. Recommend f/u w/ GI. Pt conversed at conversational level w/out deficits noted; pt was speaking on the phone to family and denied any speech-language deficits during conversation w/ them. Pt answered SLP's questions appropriately. She stated she was back to her Baseline.  No further skilled ST services indicated as pt appears at her baseline. Pt agreed. NSG to reconsult if any change in status.      Orinda Kenner, MS, CCC-SLP Speech Language Pathologist Rehab Services [phone] [facility] 02/02/2021, 10:15 AM

## 2021-02-02 NOTE — Plan of Care (Signed)
End of Shift Summary:  Alert and oriented x4. Sinus tachycardic on tele monitor. Other VSS. Afebrile. Denies pain or n/v. Urine output adequate via purewick. (-) BM this shift. Repositioned as allowed in bed. Remained free from falls or injury. Bed low and in locked position. Call bell within reach and able to use.   Problem: Education: Goal: Knowledge of General Education information will improve Description: Including pain rating scale, medication(s)/side effects and non-pharmacologic comfort measures Outcome: Progressing   Problem: Health Behavior/Discharge Planning: Goal: Ability to manage health-related needs will improve Outcome: Progressing   Problem: Clinical Measurements: Goal: Ability to maintain clinical measurements within normal limits will improve Outcome: Progressing   Problem: Clinical Measurements: Goal: Diagnostic test results will improve Outcome: Progressing   Problem: Clinical Measurements: Goal: Cardiovascular complication will be avoided Outcome: Progressing   Problem: Clinical Measurements: Goal: Respiratory complications will improve Outcome: Progressing   Problem: Activity: Goal: Risk for activity intolerance will decrease Outcome: Progressing   Problem: Safety: Goal: Ability to remain free from injury will improve Outcome: Progressing   Problem: Skin Integrity: Goal: Risk for impaired skin integrity will decrease Outcome: Progressing

## 2021-02-02 NOTE — Progress Notes (Signed)
PT Cancellation Note  Patient Details Name: Suzanne Dixon MRN: [id number] DOB: 09-14-1951   Cancelled Treatment:    Reason Eval/Treat Not Completed: Patient not medically ready.  PT consult received.  Chart reviewed.  Pt's HR noted to be 140-145 bpm on telemetry monitor in hallway; therapist entered pt's room and pt in process of sitting up on edge of bed to eat lunch.  Nurse then entered pt's room and therapist notified pt's nurse of elevated HR (nurse reporting receiving call d/t pt's elevated HR and was coming to check on pt).  D/t elevated HR with minimal activity (HR increased to 160's bpm with OT this morning standing at sink), therapy session deferred.  Will re-attempt PT evaluation tomorrow.  Leitha Bleak, PT 02/02/21, 2:00 PM

## 2021-02-02 NOTE — Hospital Course (Signed)
37 black female lives alone at home Paroxysmal A. fib [place] >4/Coumadin Breast cancer status 1991-recurrence 03/2013-S/P post chemo mastectomy Paget's disease of bone B12 deficiency, gout, hidradenitis Parkinson's plus OSA on CPAP? Recent reverse arthroplasty shoulder 12/03/2020  Present 5/3 to ED with lower extremity swelling rash +?  Cystitis + mild lower extremity pitting edema-x-ray at the time no pulmonary edema-Rx Macrobid Lasix and DC home with instructions to follow-up with her cardiologist Dr. Ubaldo Glassing  Represent 5/16 [facility] ED-daughter noticed mild speech deficits on telephone-could not give a history to [place]  Neurology consulted-notes that she seems more anxious than normal to family had not taken her anxiety meds recently  Potassium 3.3 BUN/creatinine 12/0.7 LFTs WNL White count 12 Platelet 240  CT head no hemorrhage-CXR negative

## 2021-02-03 NOTE — Progress Notes (Signed)
   02/02/21 1455  Assess: MEWS Score  BP (!) 108/59  Pulse Rate (!) 124  ECG Heart Rate (!) 112  Resp (!) 26  SpO2 100 %  O2 Device Room Air  Assess: MEWS Score  MEWS Temp 0  MEWS Systolic 0  MEWS Pulse 2  MEWS RR 2  MEWS LOC 0  MEWS Score 4  MEWS Score Color Red  Assess: if the MEWS score is Yellow or Red  Were vital signs taken at a resting state? Yes  Treat  Pain Scale 0-10  Pain Score 0  Take Vital Signs  Increase Vital Sign Frequency  Red: Q 1hr X 4 then Q 4hr X 4, if remains red, continue Q 4hrs  Escalate  MEWS: Escalate Red: discuss with charge nurse/RN and provider, consider discussing with RRT  Notify: Charge Nurse/RN  Name of Charge Nurse/RN Notified Debi RN  Date Charge Nurse/RN Notified 02/02/21  Time Charge Nurse/RN Notified 1456  Notify: Provider  Provider Name/Title dr Verlon Au  Date Provider Notified 02/02/21  Time Provider Notified 1500  Notification Reason Change in status  Provider response At bedside  Date of Provider Response 02/02/21  Time of Provider Response 1501  Notify: Rapid Response  Date Rapid Response Notified 02/02/21  Time Rapid Response Notified 1030  Document  Patient Outcome Stabilized after interventions

## 2021-02-06 LAB — CULTURE, BLOOD (ROUTINE X 2)
Culture: NO GROWTH
Culture: NO GROWTH
Special Requests: ADEQUATE
Special Requests: ADEQUATE

## 2021-02-09 ENCOUNTER — Ambulatory Visit
Admission: RE | Admit: 2021-02-09 | Discharge: 2021-02-09 | Disposition: A | Discharge status: Home / Self Care | Payer: Medicare HMO | Attending: Gastroenterology | Admitting: Gastroenterology

## 2021-02-09 ENCOUNTER — Encounter
Admission: RE | Disposition: A | Discharge status: Home / Self Care | Payer: Self-pay | Source: Home / Self Care | Attending: Gastroenterology

## 2021-02-09 HISTORY — DX: Obesity, unspecified: E66.9

## 2021-02-09 SURGERY — EGD (ESOPHAGOGASTRODUODENOSCOPY)
Anesthesia: General

## 2021-02-09 MED ORDER — LIDOCAINE HCL (PF) 2 % IJ SOLN
INTRAMUSCULAR | Status: AC
  Filled 2021-02-09: qty 4

## 2021-02-09 MED ORDER — PROPOFOL 10 MG/ML IV BOLUS
INTRAVENOUS | Status: AC
  Filled 2021-02-09: qty 20

## 2021-02-09 MED ORDER — PROPOFOL 500 MG/50ML IV EMUL
INTRAVENOUS | Status: AC
  Filled 2021-02-09: qty 50

## 2021-02-09 MED ORDER — GLYCOPYRROLATE 0.2 MG/ML IJ SOLN
INTRAMUSCULAR | Status: AC
  Filled 2021-02-09: qty 1

## 2021-02-09 MED ORDER — PROPOFOL 10 MG/ML IV BOLUS
INTRAVENOUS | Status: AC
  Filled 2021-02-09: qty 80

## 2021-02-09 MED ORDER — LIDOCAINE HCL (PF) 2 % IJ SOLN
INTRAMUSCULAR | Status: AC
  Filled 2021-02-09: qty 8

## 2021-02-09 NOTE — Anesthesia Preprocedure Evaluation (Deleted)
Anesthesia Evaluation  Patient identified by MRN, date of birth, ID band Patient awake    Reviewed: Allergy & Precautions, H&P , NPO status , reviewed documented beta blocker date and time   History of Anesthesia Complications (+) history of anesthetic complications  Airway Mallampati: II  TM Distance: >3 FB Neck ROM: full    Dental  (+) Chipped, Teeth Intact   Pulmonary shortness of breath and with exertion, sleep apnea and Continuous Positive Airway Pressure Ventilation ,    Pulmonary exam normal        Cardiovascular hypertension, Normal cardiovascular exam+ dysrhythmias Atrial Fibrillation   2014 ECHO Summary:   1. Left ventricular ejection fraction, by visual estimation, is 60 to  65%.   2. Mildly increased RV wall thickness.   3. Mildly dilated right atrium.   4. Mildly elevated pulmonary artery systolic pressure.   5. Mildly increased left ventricular posterior wall thickness.   6. Mild tricuspid regurgitation.    Neuro/Psych PSYCHIATRIC DISORDERS Depression  Neuromuscular disease CVA    GI/Hepatic GERD  Medicated and Controlled,  Endo/Other  Hypothyroidism Morbid obesity  Renal/GU      Musculoskeletal  (+) Arthritis ,   Abdominal   Peds  Hematology  (+) Blood dyscrasia, anemia ,   Anesthesia Other Findings  A-fib ([place])  Anemia B12 deficiency Breast cancer ([place])     Comment:  Right - chemo- mastectomy 1991: Breast cancer ([place])     Comment:  Rt.- radiation Complication of anesthesia     Comment:  Recent years with general anesthesia had itching               following surgery DDD (degenerative disc disease), cervical Depression Diverticulitis Dyspnea  Edema of both legs GERD (gastroesophageal reflux disease) Gout H/O mastectomy     Comment:  per patient 34: H/O: cesarean section     Comment:  per patient report 37: H/O: hysterectomy     Comment:  per patient Hydradenitis   Hypertension Hypothyroidism Neuropathy 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[place])  Parkinson's disease ([place]) Personal history of chemotherapy Personal history of radiation therapy 2002: Ruptured cervical disc     Comment:  per patient   Sleep apnea     Comment:  cpap machine  Tachycardia Past Surgical History: No date: ABDOMINAL HYSTERECTOMY 2002: BACK SURGERY     Comment:  plate and 2 screws in neck 3338,3291: BREAST BIOPSY; Right     Comment:  Positive No date: BREAST CYST ASPIRATION; Left 2014: BREAST SURGERY; Right     Comment:  mastectomy 05/17/2019: CATARACT EXTRACTION W/PHACO; Left     Comment:  Procedure: CATARACT EXTRACTION PHACO AND INTRAOCULAR               LENS PLACEMENT (IOC);  Surgeon: Eulogio Bear, MD;                Location: [facility] ORS;  Service: Ophthalmology;  Laterality:              Left;  [place] 00:28 CDE 2.15 FLUID PACK LOT # [id number] 06/14/2019: CATARACT EXTRACTION W/PHACO; Right     Comment:  Procedure: CATARACT EXTRACTION PHACO AND INTRAOCULAR               LENS PLACEMENT (IOC) RIGHT;  Surgeon: Eulogio Bear,              MD;  Location: [facility] ORS;  Service: Ophthalmology;  Laterality: Right;  [place] 00:35.0 CDE 2.37 Fluid Pack Lot               #[id number] No date: CESAREAN SECTION 04/09/2012: CHOLECYSTECTOMY 06/22/2015: COLONOSCOPY WITH PROPOFOL; N/A     Comment:  Procedure: COLONOSCOPY WITH PROPOFOL;  Surgeon: Manya Silvas, MD;  Location: [facility] ENDOSCOPY;  Service:               Endoscopy;  Laterality: N/A; No date: ESOPHAGOGASTRODUODENOSCOPY No date: EYE SURGERY No date: LAPAROSCOPIC GASTRIC BYPASS No date: MASTECTOMY; Right No date: NEUROPLASTY / TRANSPOSITION ULNAR NERVE AT ELBOW 05/13/2015: PORT-A-CATH REMOVAL; Left     Comment:  Procedure: REMOVAL PORT-A-CATH LEFT CHEST ;  Surgeon:               Florene Glen, MD;  Location: [facility] ORS;  Service:               General;  Laterality: Left; No date: REDUCTION MAMMAPLASTY;  Left 01/26/2009: ROUX-EN-Y GASTRIC BYPASS 01/03/2017: SHOULDER ARTHROSCOPY WITH OPEN ROTATOR CUFF REPAIR; Right     Comment:  Procedure: SHOULDER ARTHROSCOPY WITH OPEN ROTATOR CUFF               REPAIR;  Surgeon: Corky Mull, MD;  Location: [facility] ORS;               Service: Orthopedics;  Laterality: Right; 01/03/2017: SHOULDER ARTHROSCOPY WITH SUBACROMIAL DECOMPRESSION,  ROTATOR CUFF REPAIR AND BICEP TENDON REPAIR; Right     Comment:  Procedure: SHOULDER ARTHROSCOPY WITH SUBACROMIAL               DECOMPRESSION, ROTATOR CUFF REPAIR AND BICEP TENDON               REPAIR;  Surgeon: Corky Mull, MD;  Location: [facility] ORS;               Service: Orthopedics;  Laterality: Right;  Limited               debridement   Reproductive/Obstetrics                             Anesthesia Physical  Anesthesia Plan  ASA: IV  Anesthesia Plan: General   Post-op Pain Management:  Regional for Post-op pain   Induction: Intravenous  PONV Risk Score and Plan: 3 and Ondansetron, Midazolam and Treatment may vary due to age or medical condition  Airway Management Planned: Oral ETT  Additional Equipment:   Intra-op Plan:   Post-operative Plan: Extubation in OR  Informed Consent: I have reviewed the patients History and Physical, chart, labs and discussed the procedure including the risks, benefits and alternatives for the proposed anesthesia with the patient or authorized representative who has indicated his/her understanding and acceptance.     Dental Advisory Given  Plan Discussed with: CRNA  Anesthesia Plan Comments: (Education: OSA & GA discussed, nerve block accepted)        Anesthesia Quick Evaluation

## 2021-02-16 ENCOUNTER — Other Ambulatory Visit: Payer: Self-pay

## 2021-02-16 ENCOUNTER — Ambulatory Visit (INDEPENDENT_AMBULATORY_CARE_PROVIDER_SITE_OTHER): Payer: Medicare HMO | Admitting: Nurse Practitioner

## 2021-02-16 ENCOUNTER — Encounter (INDEPENDENT_AMBULATORY_CARE_PROVIDER_SITE_OTHER): Payer: Self-pay | Admitting: Nurse Practitioner

## 2021-02-16 VITALS — BP 168/96 | HR 82 | Resp 16 | Wt 277.0 lb

## 2021-02-16 DIAGNOSIS — I890 Lymphedema, not elsewhere classified: Secondary | ICD-10-CM

## 2021-02-16 DIAGNOSIS — I10 Essential (primary) hypertension: Secondary | ICD-10-CM

## 2021-02-16 DIAGNOSIS — L03116 Cellulitis of left lower limb: Secondary | ICD-10-CM

## 2021-02-16 NOTE — Progress Notes (Signed)
Subjective:    Patient ID: Suzanne Dixon, female    DOB: 01/05/1951, 70 y.o.   MRN: [id number] Chief Complaint  Patient presents with  . New Patient (Initial Visit)    Ref Caryl Comes ble edema    Patient is seen for evaluation of leg pain and leg swelling. The patient first noticed the swelling remotely. The swelling is associated with pain and discoloration. The pain and swelling worsens with prolonged dependency and improves with elevation. The pain is unrelated to activity.  The patient notes that in the morning the legs are significantly improved but they steadily worsened throughout the course of the day. The patient also notes a steady worsening of the discoloration in the ankle and shin area.   The patient denies claudication symptoms.  The patient denies symptoms consistent with rest pain.  The patient denies and extensive history of DJD and LS spine disease.  The patient has no had any past angiography, interventions or vascular surgery.  Elevation makes the leg symptoms better, dependency makes them much worse. There is no history of ulcerations. The patient denies any recent changes in medications.  The patient has not been wearing graduated compression.  The patient denies a history of DVT or PE. There is no prior history of phlebitis. There is a history of primary lymphedema, the patient has a lymphedema pump  No history of malignancies. No history of trauma or groin or pelvic surgery. There is no history of radiation treatment to the groin or pelvis  The patient denies amaurosis fugax or recent TIA symptoms. There are no recent neurological changes noted. The patient denies recent episodes of angina or shortness of breath   Review of Systems     Objective:   Physical Exam Vitals reviewed.  HENT:     Head: Normocephalic.  Cardiovascular:     Rate and Rhythm: Normal rate.     Pulses: Normal pulses.  Pulmonary:     Effort: Pulmonary effort is normal.  Neurological:      Mental Status: She is alert and oriented to person, place, and time.  Psychiatric:        Mood and Affect: Mood normal.        Behavior: Behavior normal.        Thought Content: Thought content normal.        Judgment: Judgment normal.     BP (!) 168/96 (BP Location: Left Arm)   Pulse 82   Resp 16   Wt 277 lb (125.6 kg)   BMI 54.10 kg/m   Past Medical History:  Diagnosis Date  . A-fib ([place])   . Anemia   . B12 deficiency   . Breast cancer ([place]) 03/31/2013   Right - chemo- mastectomy  . Breast cancer ([place]) 1991   Rt.- radiation  . Complication of anesthesia 01/2009   Recent years with general anesthesia had itching following surgery  . DDD (degenerative disc disease), cervical   . Depression   . Diverticulitis   . Dyspnea   . Edema of both legs   . GERD (gastroesophageal reflux disease)   . Gout   . H/O mastectomy 2014   per patient  . H/O: cesarean section 1987   per patient report  . H/O: hysterectomy 1996   per patient  . Hydradenitis   . Hypertension   . Hypothyroidism   . Neuropathy   . 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 Parkinson's disease ([place])   . Personal history of  chemotherapy   . Personal history of radiation therapy   . Ruptured cervical disc 2002   per patient   . Sleep apnea    cpap machine  . Super obese   . Tachycardia     Social History   Socioeconomic History  . Marital status: Widowed    Spouse name: Not on file  . Number of children: Not on file  . Years of education: Not on file  . Highest education level: Not on file  Occupational History  . Not on file  Tobacco Use  . Smoking status: Never Smoker  . Smokeless tobacco: Never Used  Vaping Use  . Vaping Use: Not on file  Substance and Sexual Activity  . Alcohol use: No    Alcohol/week: 0.0 standard drinks  . Drug use: No  . Sexual activity: Not on file  Other Topics Concern  . Not on file  Social History Narrative  . Not on file   Social Determinants of Health    Financial Resource Strain: Not on file  Food Insecurity: Not on file  Transportation Needs: Not on file  Physical Activity: Not on file  Stress: Not on file  Social Connections: Not on file  Intimate Partner Violence: Not on file    Past Surgical History:  Procedure Laterality Date  . ABDOMINAL HYSTERECTOMY    . BACK SURGERY  2002   plate and 2 screws in neck  . BREAST BIOPSY Right 1991,2014   Positive  . BREAST CYST ASPIRATION Left   . BREAST SURGERY Right 2014   mastectomy  . CATARACT EXTRACTION W/PHACO Left 05/17/2019   Procedure: CATARACT EXTRACTION PHACO AND INTRAOCULAR LENS PLACEMENT (IOC);  Surgeon: Eulogio Bear, MD;  Location: [facility] ORS;  Service: Ophthalmology;  Laterality: Left;  [place] 00:28 CDE 2.15 FLUID PACK LOT # [id number]  . CATARACT EXTRACTION W/PHACO Right 06/14/2019   Procedure: CATARACT EXTRACTION PHACO AND INTRAOCULAR LENS PLACEMENT ([place]) RIGHT;  Surgeon: Eulogio Bear, MD;  Location: [facility] ORS;  Service: Ophthalmology;  Laterality: Right;  [place] 00:35.0 CDE 2.37 Fluid Pack Lot #[id number]  . CESAREAN SECTION    . CHOLECYSTECTOMY  04/09/2012  . COLONOSCOPY WITH PROPOFOL N/A 06/22/2015   Procedure: COLONOSCOPY WITH PROPOFOL;  Surgeon: Manya Silvas, MD;  Location: [facility] ENDOSCOPY;  Service: Endoscopy;  Laterality: N/A;  . ESOPHAGOGASTRODUODENOSCOPY    . EYE SURGERY    . LAPAROSCOPIC GASTRIC BYPASS    . MASTECTOMY Right   . MASTECTOMY MODIFIED RADICAL    . mastectomy partial      lumpectomy  . NEUROPLASTY / TRANSPOSITION ULNAR NERVE AT ELBOW    . OOPHORECTOMY    . PORT-A-CATH REMOVAL Left 05/13/2015   Procedure: REMOVAL PORT-A-CATH LEFT CHEST ;  Surgeon: Florene Glen, MD;  Location: [facility] ORS;  Service: General;  Laterality: Left;  . REDUCTION MAMMAPLASTY Left   . REVERSE SHOULDER ARTHROPLASTY Right 12/03/2020   Procedure: REVERSE SHOULDER ARTHROPLASTY;  Surgeon: Corky Mull, MD;  Location: [facility] ORS;  Service: Orthopedics;  Laterality: Right;  .  ROUX-EN-Y GASTRIC BYPASS  01/26/2009  . SHOULDER ARTHROSCOPY WITH OPEN ROTATOR CUFF REPAIR Right 01/03/2017   Procedure: SHOULDER ARTHROSCOPY WITH OPEN ROTATOR CUFF REPAIR;  Surgeon: Corky Mull, MD;  Location: [facility] ORS;  Service: Orthopedics;  Laterality: Right;  . SHOULDER ARTHROSCOPY WITH SUBACROMIAL DECOMPRESSION, ROTATOR CUFF REPAIR AND BICEP TENDON REPAIR Right 01/03/2017   Procedure: SHOULDER ARTHROSCOPY WITH SUBACROMIAL DECOMPRESSION, ROTATOR CUFF REPAIR AND BICEP TENDON REPAIR;  Surgeon: Marshall Cork  Poggi, MD;  Location: [facility] ORS;  Service: Orthopedics;  Laterality: Right;  Limited debridement  . SPINE SURGERY      Family History  Problem Relation Age of Onset  . Hypertension Mother   . Lymphoma Mother   . Cancer Sister        Breast  . Breast cancer Sister 52    Allergies  Allergen Reactions  . Advair Diskus [Fluticasone-Salmeterol] Other (See Comments)    Joint pain   . Alendronate     Muscle pain  . Singulair [Montelukast] Other (See Comments)    "muscle pain"  . Sulfa Antibiotics Hives  . Venlafaxine Other (See Comments)    "altered mental status/tremors"    CBC Latest Ref Rng & Units 02/02/2021 02/01/2021 01/19/2021  WBC 4.0 - 10.5 K/uL 8.5 12.0(H) 6.4  Hemoglobin 12.0 - 15.0 g/dL 12.4 13.6 13.3  Hematocrit 36.0 - 46.0 % 36.3 38.7 38.6  Platelets 150 - 400 K/uL 217 242 256      CMP     Component Value Date/Time   NA 138 02/02/2021 0757   NA 136 12/24/2013 1117   K 3.3 (L) 02/02/2021 0757   K 4.2 01/13/2014 0958   CL 103 02/02/2021 0757   CL 98 12/24/2013 1117   CO2 25 02/02/2021 0757   CO2 31 12/24/2013 1117   GLUCOSE 100 (H) 02/02/2021 0757   GLUCOSE 90 12/24/2013 1117   BUN 7 (L) 02/02/2021 0757   BUN 8 12/24/2013 1117   CREATININE 0.60 02/02/2021 0757   CREATININE 0.74 07/23/2014 1441   CALCIUM 8.2 (L) 02/02/2021 0757   CALCIUM 8.7 12/24/2013 1117   PROT 7.1 02/02/2021 0757   PROT 6.6 06/24/2013 1112   ALBUMIN 3.2 (L) 02/02/2021 0757   ALBUMIN 3.1  (L) 06/24/2013 1112   AST 24 02/02/2021 0757   AST 26 06/24/2013 1112   ALT 14 02/02/2021 0757   ALT 22 06/24/2013 1112   ALKPHOS 74 02/02/2021 0757   ALKPHOS 101 06/24/2013 1112   BILITOT 1.0 02/02/2021 0757   BILITOT 0.2 06/24/2013 1112   GFRNONAA >60 02/02/2021 0757   GFRNONAA >60 07/23/2014 1441   GFRNONAA >60 12/24/2013 1117   GFRAA >60 12/20/2019 1846   GFRAA >60 07/23/2014 1441   GFRAA >60 12/24/2013 1117     No results found.     Assessment & Plan:   1. Lymphedema Previously saw the patient about a year ago and it appears that she has a lymphedema pump at home.  However she notes that she does not use it regularly.  We discussed conservative therapy including medical grade compression stockings, elevation as well as activity when possible.  We discussed length of use for lymphedema pump.  Previously the patient had been instructed to utilize it for an hour twice a day.  She notes that she can barely use it for an hour.  I suggested that she start with 30 minutes in the evening time to see how this builds her tolerance.  We will also place the patient in [place] wraps to bring her swelling under control.  She will present to the office on a weekly basis to have his wraps changed.  We will reevaluate her lower extremity edema in 4 weeks.  2. Essential hypertension Continue antihypertensive medications as already ordered, these medications have been reviewed and there are no changes at this time.   3. Left leg cellulitis Appears to be resolved today.  Compression will certainly help with continued further recurrences of  cellulitis.   Current Outpatient Medications on File Prior to Visit  Medication Sig Dispense Refill  . allopurinol (ZYLOPRIM) 300 MG tablet Take 300 mg by mouth daily.     . Calcium Carb-Cholecalciferol 600-400 MG-UNIT TABS Take 1 tablet by mouth 2 (two) times daily with a meal.    . carbidopa-levodopa (SINEMET IR) 25-100 MG tablet Take 1.5 tablets by mouth 3  (three) times daily.    . DULoxetine (CYMBALTA) 60 MG capsule Take 60 mg by mouth 2 (two) times daily.    . fluticasone (FLONASE) 50 MCG/ACT nasal spray Place into both nostrils daily.    . furosemide (LASIX) 40 MG tablet Take 40 mg by mouth daily.    Marland Kitchen gabapentin (NEURONTIN) 600 MG tablet Take 600 mg by mouth 4 (four) times daily.  11  . Homeopathic Products (PSORIASIS/ECZEMA RELIEF EX) Apply 1 application topically daily as needed (eczema).    . hydrocortisone 2.5 % cream Apply topically 2 (two) times daily.    Marland Kitchen levothyroxine (SYNTHROID, LEVOTHROID) 50 MCG tablet Take 50 mcg by mouth daily before breakfast.    . Magnesium 400 MG CAPS Take 400 mg by mouth daily.    . meloxicam (MOBIC) 15 MG tablet Take 15 mg by mouth daily.    . metoprolol (LOPRESSOR) 100 MG tablet Take 200 mg by mouth 2 (two) times daily.     . Multiple Vitamins tablet Take 2 tablets by mouth daily.     Marland Kitchen omeprazole (PRILOSEC) 20 MG capsule Take 20 mg by mouth daily.     Marland Kitchen oxybutynin (DITROPAN) 5 MG tablet Take 5 mg by mouth 2 (two) times daily.    . potassium chloride (K-DUR,KLOR-CON) 10 MEQ tablet Take 10 mEq by mouth 3 (three) times daily.     . traMADol (ULTRAM) 50 MG tablet Take 1 tablet (50 mg total) by mouth every 6 (six) hours as needed for moderate pain. 30 tablet 0  . triamcinolone (KENALOG) 0.025 % cream Apply 1 application topically 2 (two) times daily.    . vitamin B-12 (CYANOCOBALAMIN) 1000 MCG tablet Take 1,000 mcg by mouth daily.    Marland Kitchen warfarin (COUMADIN) 1 MG tablet Take 1 mg by mouth at bedtime. (take with 6mg  tablet to equal 7mg  total)    . warfarin (COUMADIN) 6 MG tablet Take 6 mg by mouth at bedtime. (take with 1mg  tablet to equal 7mg  total)    . HYDROcodone-acetaminophen (NORCO/VICODIN) 5-325 MG tablet Take 1-2 tablets by mouth every 4 (four) hours as needed for moderate pain (pain score 4-6). (Patient not taking: No sig reported) 60 tablet 0   No current facility-administered medications on file prior  to visit.    There are no Patient Instructions on file for this visit. No follow-ups on file.   Kris Hartmann, NP

## 2021-02-23 ENCOUNTER — Encounter (INDEPENDENT_AMBULATORY_CARE_PROVIDER_SITE_OTHER): Payer: Medicare HMO

## 2021-03-02 ENCOUNTER — Encounter (INDEPENDENT_AMBULATORY_CARE_PROVIDER_SITE_OTHER): Payer: Medicare HMO

## 2021-03-09 ENCOUNTER — Encounter (INDEPENDENT_AMBULATORY_CARE_PROVIDER_SITE_OTHER): Payer: Medicare HMO

## 2021-03-16 ENCOUNTER — Ambulatory Visit (INDEPENDENT_AMBULATORY_CARE_PROVIDER_SITE_OTHER): Payer: Medicare HMO | Admitting: Nurse Practitioner

## 2021-03-19 ENCOUNTER — Other Ambulatory Visit: Payer: Self-pay

## 2021-03-19 ENCOUNTER — Ambulatory Visit (INDEPENDENT_AMBULATORY_CARE_PROVIDER_SITE_OTHER): Payer: Medicare HMO | Admitting: Nurse Practitioner

## 2021-03-19 VITALS — BP 145/85 | HR 73 | Ht 64.0 in | Wt 273.0 lb

## 2021-03-19 DIAGNOSIS — I890 Lymphedema, not elsewhere classified: Secondary | ICD-10-CM | POA: Diagnosis not present

## 2021-03-19 DIAGNOSIS — I10 Essential (primary) hypertension: Secondary | ICD-10-CM | POA: Diagnosis not present

## 2021-04-03 ENCOUNTER — Encounter (INDEPENDENT_AMBULATORY_CARE_PROVIDER_SITE_OTHER): Payer: Self-pay | Admitting: Nurse Practitioner

## 2021-04-03 NOTE — Progress Notes (Signed)
Subjective:    Patient ID: Suzanne Dixon, female    DOB: 09-14-1951, 70 y.o.   MRN: [id number] Chief Complaint  Patient presents with   Follow-up    4 wk BIL unna boot check    The patient returns to the office for followup evaluation regarding leg swelling.  The swelling has persisted and the pain associated with swelling continues. There have not been any interval development of a ulcerations or wounds.  Since the previous visit the patient has been wearing Unna wraps.  And has noted little if any improvement in the lymphedema. The patient has been using compression routinely morning until night.  The patient also states elevation during the day and exercise is being done too.      Review of Systems  Cardiovascular:  Positive for leg swelling.  All other systems reviewed and are negative.     Objective:   Physical Exam Vitals reviewed.  HENT:     Head: Normocephalic.  Cardiovascular:     Rate and Rhythm: Normal rate.     Pulses: Normal pulses.  Pulmonary:     Effort: Pulmonary effort is normal.  Musculoskeletal:     Right lower leg: Edema present.     Left lower leg: Edema present.  Neurological:     Mental Status: She is alert and oriented to person, place, and time.  Psychiatric:        Mood and Affect: Mood normal.        Behavior: Behavior normal.        Thought Content: Thought content normal.        Judgment: Judgment normal.    BP (!) 145/85   Pulse 73   Ht 5\' 4"  (1.626 m)   Wt 273 lb (123.8 kg)   BMI 46.86 kg/m   Past Medical History:  Diagnosis Date   A-fib ([place])    Anemia    B12 deficiency    Breast cancer ([place]) 03/31/2013   Right - chemo- mastectomy   Breast cancer ([place]) 1991   Rt.- radiation   Complication of anesthesia 01/2009   Recent years with general anesthesia had itching following surgery   DDD (degenerative disc disease), cervical    Depression    Diverticulitis    Dyspnea    Edema of both legs    GERD (gastroesophageal reflux  disease)    Gout    H/O mastectomy 2014   per patient   H/O: cesarean section 1987   per patient report   H/O: hysterectomy 1996   per patient   Hydradenitis    Hypertension    Hypothyroidism    Neuropathy    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[place])    Parkinson's disease ([place])    Personal history of chemotherapy    Personal history of radiation therapy    Ruptured cervical disc 2002   per patient    Sleep apnea    cpap machine   Super obese    Tachycardia     Social History   Socioeconomic History   Marital status: Widowed    Spouse name: Not on file   Number of children: Not on file   Years of education: Not on file   Highest education level: Not on file  Occupational History   Not on file  Tobacco Use   Smoking status: Never   Smokeless tobacco: Never  Vaping Use   Vaping Use: Not on file  Substance and Sexual Activity   Alcohol use:  No    Alcohol/week: 0.0 standard drinks   Drug use: No   Sexual activity: Not on file  Other Topics Concern   Not on file  Social History Narrative   Not on file   Social Determinants of Health   Financial Resource Strain: Not on file  Food Insecurity: Not on file  Transportation Needs: Not on file  Physical Activity: Not on file  Stress: Not on file  Social Connections: Not on file  Intimate Partner Violence: Not on file    Past Surgical History:  Procedure Laterality Date   ABDOMINAL HYSTERECTOMY     BACK SURGERY  2002   plate and 2 screws in neck   BREAST BIOPSY Right 1991,2014   Positive   BREAST CYST ASPIRATION Left    BREAST SURGERY Right 2014   mastectomy   CATARACT EXTRACTION W/PHACO Left 05/17/2019   Procedure: CATARACT EXTRACTION PHACO AND INTRAOCULAR LENS PLACEMENT ([place]);  Surgeon: Eulogio Bear, MD;  Location: [facility] ORS;  Service: Ophthalmology;  Laterality: Left;  [place] 00:28 CDE 2.15 FLUID PACK LOT # [id number]   CATARACT EXTRACTION W/PHACO Right 06/14/2019   Procedure: CATARACT EXTRACTION PHACO AND  INTRAOCULAR LENS PLACEMENT ([facility]) RIGHT;  Surgeon: Eulogio Bear, MD;  Location: [facility] ORS;  Service: Ophthalmology;  Laterality: Right;  [place] 00:35.0 CDE 2.37 Fluid Pack Lot #[id number]   CESAREAN SECTION     CHOLECYSTECTOMY  04/09/2012   COLONOSCOPY WITH PROPOFOL N/A 06/22/2015   Procedure: COLONOSCOPY WITH PROPOFOL;  Surgeon: Manya Silvas, MD;  Location: [facility] ENDOSCOPY;  Service: Endoscopy;  Laterality: N/A;   ESOPHAGOGASTRODUODENOSCOPY     EYE SURGERY     LAPAROSCOPIC GASTRIC BYPASS     MASTECTOMY Right    MASTECTOMY MODIFIED RADICAL     mastectomy partial      lumpectomy   NEUROPLASTY / TRANSPOSITION ULNAR NERVE AT ELBOW     OOPHORECTOMY     PORT-A-CATH REMOVAL Left 05/13/2015   Procedure: REMOVAL PORT-A-CATH LEFT CHEST ;  Surgeon: Florene Glen, MD;  Location: [facility] ORS;  Service: General;  Laterality: Left;   REDUCTION MAMMAPLASTY Left    REVERSE SHOULDER ARTHROPLASTY Right 12/03/2020   Procedure: REVERSE SHOULDER ARTHROPLASTY;  Surgeon: Corky Mull, MD;  Location: [facility] ORS;  Service: Orthopedics;  Laterality: Right;   ROUX-EN-Y GASTRIC BYPASS  01/26/2009   SHOULDER ARTHROSCOPY WITH OPEN ROTATOR CUFF REPAIR Right 01/03/2017   Procedure: SHOULDER ARTHROSCOPY WITH OPEN ROTATOR CUFF REPAIR;  Surgeon: Corky Mull, MD;  Location: [facility] ORS;  Service: Orthopedics;  Laterality: Right;   SHOULDER ARTHROSCOPY WITH SUBACROMIAL DECOMPRESSION, ROTATOR CUFF REPAIR AND BICEP TENDON REPAIR Right 01/03/2017   Procedure: SHOULDER ARTHROSCOPY WITH SUBACROMIAL DECOMPRESSION, ROTATOR CUFF REPAIR AND BICEP TENDON REPAIR;  Surgeon: Corky Mull, MD;  Location: [facility] ORS;  Service: Orthopedics;  Laterality: Right;  Limited debridement   SPINE SURGERY      Family History  Problem Relation Age of Onset   Hypertension Mother    Lymphoma Mother    Cancer Sister        Breast   Breast cancer Sister 60    Allergies  Allergen Reactions   Advair Diskus [Fluticasone-Salmeterol] Other (See Comments)     Joint pain    Alendronate     Muscle pain   Singulair [Montelukast] Other (See Comments)    "muscle pain"   Sulfa Antibiotics Hives   Venlafaxine Other (See Comments)    "altered mental status/tremors"    CBC Latest Ref Rng &  Units 02/02/2021 02/01/2021 01/19/2021  WBC 4.0 - 10.5 K/uL 8.5 12.0(H) 6.4  Hemoglobin 12.0 - 15.0 g/dL 12.4 13.6 13.3  Hematocrit 36.0 - 46.0 % 36.3 38.7 38.6  Platelets 150 - 400 K/uL 217 242 256      CMP     Component Value Date/Time   NA 138 02/02/2021 0757   NA 136 12/24/2013 1117   K 3.3 (L) 02/02/2021 0757   K 4.2 01/13/2014 0958   CL 103 02/02/2021 0757   CL 98 12/24/2013 1117   CO2 25 02/02/2021 0757   CO2 31 12/24/2013 1117   GLUCOSE 100 (H) 02/02/2021 0757   GLUCOSE 90 12/24/2013 1117   BUN 7 (L) 02/02/2021 0757   BUN 8 12/24/2013 1117   CREATININE 0.60 02/02/2021 0757   CREATININE 0.74 07/23/2014 1441   CALCIUM 8.2 (L) 02/02/2021 0757   CALCIUM 8.7 12/24/2013 1117   PROT 7.1 02/02/2021 0757   PROT 6.6 06/24/2013 1112   ALBUMIN 3.2 (L) 02/02/2021 0757   ALBUMIN 3.1 (L) 06/24/2013 1112   AST 24 02/02/2021 0757   AST 26 06/24/2013 1112   ALT 14 02/02/2021 0757   ALT 22 06/24/2013 1112   ALKPHOS 74 02/02/2021 0757   ALKPHOS 101 06/24/2013 1112   BILITOT 1.0 02/02/2021 0757   BILITOT 0.2 06/24/2013 1112   GFRNONAA >60 02/02/2021 0757   GFRNONAA >60 07/23/2014 1441   GFRNONAA >60 12/24/2013 1117   GFRAA >60 12/20/2019 1846   GFRAA >60 07/23/2014 1441   GFRAA >60 12/24/2013 1117     No results found.     Assessment & Plan:   1. Lymphedema Swelling is improved from 4 weeks in unna wraps but not completely contained.  The use of compression socks is still difficult.  We will have the patient remain in [place] wraps at this time.  She was also advised to continue to utilize her lymphedema pump.  We will have her return in 4 weeks for reevaluation.  2. Essential hypertension Continue antihypertensive medications as already  ordered, these medications have been reviewed and there are no changes at this time.    Current Outpatient Medications on File Prior to Visit  Medication Sig Dispense Refill   allopurinol (ZYLOPRIM) 300 MG tablet Take 300 mg by mouth daily.      Calcium Carb-Cholecalciferol 600-400 MG-UNIT TABS Take 1 tablet by mouth 2 (two) times daily with a meal.     carbidopa-levodopa (SINEMET IR) 25-100 MG tablet Take 1.5 tablets by mouth 3 (three) times daily.     DULoxetine (CYMBALTA) 60 MG capsule Take 60 mg by mouth 2 (two) times daily.     DULoxetine (CYMBALTA) 60 MG capsule Take 1 capsule by mouth 2 (two) times daily.     fluticasone (FLONASE) 50 MCG/ACT nasal spray Place into both nostrils daily.     furosemide (LASIX) 40 MG tablet Take 40 mg by mouth daily.     gabapentin (NEURONTIN) 600 MG tablet Take 600 mg by mouth 4 (four) times daily.  11   Homeopathic Products (PSORIASIS/ECZEMA RELIEF EX) Apply 1 application topically daily as needed (eczema).     HYDROcodone-acetaminophen (NORCO/VICODIN) 5-325 MG tablet Take 1-2 tablets by mouth every 4 (four) hours as needed for moderate pain (pain score 4-6). 60 tablet 0   hydrocortisone 2.5 % cream Apply topically 2 (two) times daily.     levothyroxine (SYNTHROID, LEVOTHROID) 50 MCG tablet Take 50 mcg by mouth daily before breakfast.     Magnesium 400 MG  CAPS Take 400 mg by mouth daily.     meloxicam (MOBIC) 15 MG tablet Take 15 mg by mouth daily.     metoprolol (LOPRESSOR) 100 MG tablet Take 200 mg by mouth 2 (two) times daily.      Multiple Vitamins tablet Take 2 tablets by mouth daily.      omeprazole (PRILOSEC) 20 MG capsule Take 20 mg by mouth daily.      oxybutynin (DITROPAN) 5 MG tablet Take 5 mg by mouth 2 (two) times daily.     potassium chloride (K-DUR,KLOR-CON) 10 MEQ tablet Take 10 mEq by mouth 3 (three) times daily.      potassium chloride (KLOR-CON) 10 MEQ tablet Take 10 mEq by mouth 3 (three) times daily.     traMADol (ULTRAM) 50 MG  tablet Take 1 tablet (50 mg total) by mouth every 6 (six) hours as needed for moderate pain. 30 tablet 0   triamcinolone (KENALOG) 0.025 % cream Apply 1 application topically 2 (two) times daily.     vitamin B-12 (CYANOCOBALAMIN) 1000 MCG tablet Take 1,000 mcg by mouth daily.     warfarin (COUMADIN) 1 MG tablet Take 1 mg by mouth at bedtime. (take with 6mg  tablet to equal 7mg  total)     warfarin (COUMADIN) 6 MG tablet Take 6 mg by mouth at bedtime. (take with 1mg  tablet to equal 7mg  total)     No current facility-administered medications on file prior to visit.    There are no Patient Instructions on file for this visit. No follow-ups on file.   Kris Hartmann, NP

## 2021-04-16 ENCOUNTER — Ambulatory Visit (INDEPENDENT_AMBULATORY_CARE_PROVIDER_SITE_OTHER): Payer: Medicare HMO | Admitting: Nurse Practitioner

## 2021-04-19 ENCOUNTER — Ambulatory Visit (INDEPENDENT_AMBULATORY_CARE_PROVIDER_SITE_OTHER): Payer: Medicare HMO | Admitting: Nurse Practitioner

## 2021-04-19 ENCOUNTER — Other Ambulatory Visit: Payer: Self-pay

## 2021-04-19 ENCOUNTER — Encounter (INDEPENDENT_AMBULATORY_CARE_PROVIDER_SITE_OTHER): Payer: Self-pay | Admitting: Nurse Practitioner

## 2021-04-19 VITALS — BP 113/77 | HR 72 | Resp 20 | Ht 64.0 in | Wt 270.0 lb

## 2021-04-19 DIAGNOSIS — I10 Essential (primary) hypertension: Secondary | ICD-10-CM | POA: Diagnosis not present

## 2021-04-19 DIAGNOSIS — I890 Lymphedema, not elsewhere classified: Secondary | ICD-10-CM

## 2021-04-19 DIAGNOSIS — L03116 Cellulitis of left lower limb: Secondary | ICD-10-CM | POA: Diagnosis not present

## 2021-04-19 NOTE — Progress Notes (Signed)
Subjective:    Patient ID: Suzanne Dixon, female    DOB: April 27, 1951, 70 y.o.   MRN: [id number] Chief Complaint  Patient presents with   Follow-up    Bilateral LE swelling    The patient returns to the office for followup evaluation regarding leg swelling.  The swelling has improved quite a bit and the pain associated with swelling has decreased substantially. There have not been any interval development of a ulcerations or wounds.  Since the previous visit the patient has been wearing Unna wraps and the swelling has greatly decreased. The patient also states elevation during the day and exercise is being done too.        Review of Systems  Cardiovascular:  Positive for leg swelling.  Musculoskeletal:  Positive for joint swelling.      Objective:   Physical Exam Vitals reviewed.  HENT:     Head: Normocephalic.  Cardiovascular:     Rate and Rhythm: Normal rate.     Pulses: Normal pulses.  Pulmonary:     Effort: Pulmonary effort is normal.  Musculoskeletal:     Right lower leg: 1+ Edema present.     Left lower leg: 1+ Edema present.  Neurological:     Mental Status: She is alert and oriented to person, place, and time.     Motor: Weakness present.     Gait: Gait abnormal.  Psychiatric:        Mood and Affect: Mood normal.        Behavior: Behavior normal.        Thought Content: Thought content normal.        Judgment: Judgment normal.    BP 113/77 (BP Location: Left Wrist)   Pulse 72   Resp 20   Ht '5\' 4"'$  (1.626 m)   Wt 270 lb (122.5 kg)   BMI 46.35 kg/m   Past Medical History:  Diagnosis Date   A-fib ([place])    Anemia    B12 deficiency    Breast cancer ([place]) 03/31/2013   Right - chemo- mastectomy   Breast cancer ([place]) 1991   Rt.- radiation   Complication of anesthesia 01/2009   Recent years with general anesthesia had itching following surgery   DDD (degenerative disc disease), cervical    Depression    Diverticulitis    Dyspnea    Edema of both legs     GERD (gastroesophageal reflux disease)    Gout    H/O mastectomy 2014   per patient   H/O: cesarean section 1987   per patient report   H/O: hysterectomy 1996   per patient   Hydradenitis    Hypertension    Hypothyroidism    Neuropathy    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[place])    Parkinson's disease ([place])    Personal history of chemotherapy    Personal history of radiation therapy    Ruptured cervical disc 2002   per patient    Sleep apnea    cpap machine   Super obese    Tachycardia     Social History   Socioeconomic History   Marital status: Widowed    Spouse name: Not on file   Number of children: Not on file   Years of education: Not on file   Highest education level: Not on file  Occupational History   Not on file  Tobacco Use   Smoking status: Never   Smokeless tobacco: Never  Vaping Use   Vaping Use: Not  on file  Substance and Sexual Activity   Alcohol use: No    Alcohol/week: 0.0 standard drinks   Drug use: No   Sexual activity: Not on file  Other Topics Concern   Not on file  Social History Narrative   Not on file   Social Determinants of Health   Financial Resource Strain: Not on file  Food Insecurity: Not on file  Transportation Needs: Not on file  Physical Activity: Not on file  Stress: Not on file  Social Connections: Not on file  Intimate Partner Violence: Not on file    Past Surgical History:  Procedure Laterality Date   ABDOMINAL HYSTERECTOMY     BACK SURGERY  2002   plate and 2 screws in neck   BREAST BIOPSY Right 1991,2014   Positive   BREAST CYST ASPIRATION Left    BREAST SURGERY Right 2014   mastectomy   CATARACT EXTRACTION W/PHACO Left 05/17/2019   Procedure: CATARACT EXTRACTION PHACO AND INTRAOCULAR LENS PLACEMENT ([place]);  Surgeon: Eulogio Bear, MD;  Location: [facility] ORS;  Service: Ophthalmology;  Laterality: Left;  [place] 00:28 CDE 2.15 FLUID PACK LOT # [id number]   CATARACT EXTRACTION W/PHACO Right 06/14/2019   Procedure:  CATARACT EXTRACTION PHACO AND INTRAOCULAR LENS PLACEMENT ([facility]) RIGHT;  Surgeon: Eulogio Bear, MD;  Location: [facility] ORS;  Service: Ophthalmology;  Laterality: Right;  [place] 00:35.0 CDE 2.37 Fluid Pack Lot [id number]   CESAREAN SECTION     CHOLECYSTECTOMY  04/09/2012   COLONOSCOPY WITH PROPOFOL N/A 06/22/2015   Procedure: COLONOSCOPY WITH PROPOFOL;  Surgeon: Manya Silvas, MD;  Location: [facility] ENDOSCOPY;  Service: Endoscopy;  Laterality: N/A;   ESOPHAGOGASTRODUODENOSCOPY     EYE SURGERY     LAPAROSCOPIC GASTRIC BYPASS     MASTECTOMY Right    MASTECTOMY MODIFIED RADICAL     mastectomy partial      lumpectomy   NEUROPLASTY / TRANSPOSITION ULNAR NERVE AT ELBOW     OOPHORECTOMY     PORT-A-CATH REMOVAL Left 05/13/2015   Procedure: REMOVAL PORT-A-CATH LEFT CHEST ;  Surgeon: Florene Glen, MD;  Location: [facility] ORS;  Service: General;  Laterality: Left;   REDUCTION MAMMAPLASTY Left    REVERSE SHOULDER ARTHROPLASTY Right 12/03/2020   Procedure: REVERSE SHOULDER ARTHROPLASTY;  Surgeon: Corky Mull, MD;  Location: [facility] ORS;  Service: Orthopedics;  Laterality: Right;   ROUX-EN-Y GASTRIC BYPASS  01/26/2009   SHOULDER ARTHROSCOPY WITH OPEN ROTATOR CUFF REPAIR Right 01/03/2017   Procedure: SHOULDER ARTHROSCOPY WITH OPEN ROTATOR CUFF REPAIR;  Surgeon: Corky Mull, MD;  Location: [facility] ORS;  Service: Orthopedics;  Laterality: Right;   SHOULDER ARTHROSCOPY WITH SUBACROMIAL DECOMPRESSION, ROTATOR CUFF REPAIR AND BICEP TENDON REPAIR Right 01/03/2017   Procedure: SHOULDER ARTHROSCOPY WITH SUBACROMIAL DECOMPRESSION, ROTATOR CUFF REPAIR AND BICEP TENDON REPAIR;  Surgeon: Corky Mull, MD;  Location: [facility] ORS;  Service: Orthopedics;  Laterality: Right;  Limited debridement   SPINE SURGERY      Family History  Problem Relation Age of Onset   Hypertension Mother    Lymphoma Mother    Cancer Sister        Breast   Breast cancer Sister 37    Allergies  Allergen Reactions   Advair Diskus  [Fluticasone-Salmeterol] Other (See Comments)    Joint pain    Alendronate     Muscle pain   Singulair [Montelukast] Other (See Comments)    "muscle pain"   Sulfa Antibiotics Hives   Venlafaxine Other (See Comments)    "  altered mental status/tremors"    CBC Latest Ref Rng & Units 02/02/2021 02/01/2021 01/19/2021  WBC 4.0 - 10.5 K/uL 8.5 12.0(H) 6.4  Hemoglobin 12.0 - 15.0 g/dL 12.4 13.6 13.3  Hematocrit 36.0 - 46.0 % 36.3 38.7 38.6  Platelets 150 - 400 K/uL 217 242 256      CMP     Component Value Date/Time   NA 138 02/02/2021 0757   NA 136 12/24/2013 1117   K 3.3 (L) 02/02/2021 0757   K 4.2 01/13/2014 0958   CL 103 02/02/2021 0757   CL 98 12/24/2013 1117   CO2 25 02/02/2021 0757   CO2 31 12/24/2013 1117   GLUCOSE 100 (H) 02/02/2021 0757   GLUCOSE 90 12/24/2013 1117   BUN 7 (L) 02/02/2021 0757   BUN 8 12/24/2013 1117   CREATININE 0.60 02/02/2021 0757   CREATININE 0.74 07/23/2014 1441   CALCIUM 8.2 (L) 02/02/2021 0757   CALCIUM 8.7 12/24/2013 1117   PROT 7.1 02/02/2021 0757   PROT 6.6 06/24/2013 1112   ALBUMIN 3.2 (L) 02/02/2021 0757   ALBUMIN 3.1 (L) 06/24/2013 1112   AST 24 02/02/2021 0757   AST 26 06/24/2013 1112   ALT 14 02/02/2021 0757   ALT 22 06/24/2013 1112   ALKPHOS 74 02/02/2021 0757   ALKPHOS 101 06/24/2013 1112   BILITOT 1.0 02/02/2021 0757   BILITOT 0.2 06/24/2013 1112   GFRNONAA >60 02/02/2021 0757   GFRNONAA >60 07/23/2014 1441   GFRNONAA >60 12/24/2013 1117   GFRAA >60 12/20/2019 1846   GFRAA >60 07/23/2014 1441   GFRAA >60 12/24/2013 1117     No results found.     Assessment & Plan:   1. Lymphedema Today we will remove the patient from [place] wraps.  Her edema is drastically decreased today.  The patient is advised to utilize medical grade compression stockings.  These should be placed daily.  She should take 1 in the morning and take them off before bedtime.  The patient does have a lymphedema pump although she is unaware where it is.   However she can find it and use of this for 1 hour during the evening would be helpful.  Patient also elevate her lower extremities is much as possible in addition to being active.  Activity may be slightly difficult due to the patient's pain  2. Essential hypertension Continue antihypertensive medications as already ordered, these medications have been reviewed and there are no changes at this time.   3. Left leg cellulitis This is resolved   Current Outpatient Medications on File Prior to Visit  Medication Sig Dispense Refill   allopurinol (ZYLOPRIM) 300 MG tablet Take 300 mg by mouth daily.      Calcium Carb-Cholecalciferol 600-400 MG-UNIT TABS Take 1 tablet by mouth 2 (two) times daily with a meal.     carbidopa-levodopa (SINEMET IR) 25-100 MG tablet Take 1.5 tablets by mouth 3 (three) times daily.     DULoxetine (CYMBALTA) 60 MG capsule Take 60 mg by mouth 2 (two) times daily.     DULoxetine (CYMBALTA) 60 MG capsule Take 1 capsule by mouth 2 (two) times daily.     fluticasone (FLONASE) 50 MCG/ACT nasal spray Place into both nostrils daily.     furosemide (LASIX) 40 MG tablet Take 40 mg by mouth daily.     gabapentin (NEURONTIN) 600 MG tablet Take 600 mg by mouth 4 (four) times daily.  11   Homeopathic Products (PSORIASIS/ECZEMA RELIEF EX) Apply 1 application topically daily  as needed (eczema).     HYDROcodone-acetaminophen (NORCO/VICODIN) 5-325 MG tablet Take 1-2 tablets by mouth every 4 (four) hours as needed for moderate pain (pain score 4-6). 60 tablet 0   hydrocortisone 2.5 % cream Apply topically 2 (two) times daily.     levothyroxine (SYNTHROID, LEVOTHROID) 50 MCG tablet Take 50 mcg by mouth daily before breakfast.     Magnesium 400 MG CAPS Take 400 mg by mouth daily.     meloxicam (MOBIC) 15 MG tablet Take 15 mg by mouth daily.     metoprolol (LOPRESSOR) 100 MG tablet Take 200 mg by mouth 2 (two) times daily.      Multiple Vitamins tablet Take 2 tablets by mouth daily.       omeprazole (PRILOSEC) 20 MG capsule Take 20 mg by mouth daily.      oxybutynin (DITROPAN) 5 MG tablet Take 5 mg by mouth 2 (two) times daily.     potassium chloride (K-DUR,KLOR-CON) 10 MEQ tablet Take 10 mEq by mouth 3 (three) times daily.      potassium chloride (KLOR-CON) 10 MEQ tablet Take 10 mEq by mouth 3 (three) times daily.     traMADol (ULTRAM) 50 MG tablet Take 1 tablet (50 mg total) by mouth every 6 (six) hours as needed for moderate pain. 30 tablet 0   triamcinolone (KENALOG) 0.025 % cream Apply 1 application topically 2 (two) times daily.     vitamin B-12 (CYANOCOBALAMIN) 1000 MCG tablet Take 1,000 mcg by mouth daily.     warfarin (COUMADIN) 1 MG tablet Take 1 mg by mouth at bedtime. (take with '6mg'$  tablet to equal '7mg'$  total)     warfarin (COUMADIN) 6 MG tablet Take 6 mg by mouth at bedtime. (take with '1mg'$  tablet to equal '7mg'$  total)     No current facility-administered medications on file prior to visit.    There are no Patient Instructions on file for this visit. No follow-ups on file.   Kris Hartmann, NP

## 2021-05-18 ENCOUNTER — Telehealth (INDEPENDENT_AMBULATORY_CARE_PROVIDER_SITE_OTHER): Payer: Self-pay

## 2021-05-18 NOTE — Telephone Encounter (Signed)
Patient called stating that her legs were swollen. I ask patient if she has been wearing her compression stocking and she verbalized that she do not have any. The patient stated that she has a nurse that is coming to the home on Wednesday. Patient was offered to come in the office for unna wraps but the due transportation patient is not able to come in.I spoke with Dr Delana Meyer and he recommended for the patient to go to [facility] to be fitted and to get compression wraps. I advise the patient to elevate to help with swelling. I informed the patient that the nurse can contact the office if needed.

## 2021-05-21 ENCOUNTER — Other Ambulatory Visit: Payer: Self-pay | Admitting: Internal Medicine

## 2021-05-21 DIAGNOSIS — Z1231 Encounter for screening mammogram for malignant neoplasm of breast: Secondary | ICD-10-CM

## 2021-05-25 ENCOUNTER — Ambulatory Visit: Payer: Medicare HMO | Admitting: Anesthesiology

## 2021-05-25 ENCOUNTER — Ambulatory Visit
Admission: RE | Admit: 2021-05-25 | Discharge: 2021-05-25 | Disposition: A | Discharge status: Home / Self Care | Payer: Medicare HMO | Attending: Gastroenterology | Admitting: Gastroenterology

## 2021-05-25 ENCOUNTER — Encounter
Admission: RE | Disposition: A | Discharge status: Home / Self Care | Payer: Self-pay | Source: Home / Self Care | Attending: Gastroenterology

## 2021-05-25 ENCOUNTER — Encounter: Payer: Self-pay | Admitting: *Deleted

## 2021-05-25 DIAGNOSIS — Z79890 Hormone replacement therapy (postmenopausal): Secondary | ICD-10-CM | POA: Insufficient documentation

## 2021-05-25 DIAGNOSIS — Z882 Allergy status to sulfonamides status: Secondary | ICD-10-CM | POA: Diagnosis not present

## 2021-05-25 DIAGNOSIS — K641 Second degree hemorrhoids: Secondary | ICD-10-CM | POA: Diagnosis not present

## 2021-05-25 DIAGNOSIS — K5731 Diverticulosis of large intestine without perforation or abscess with bleeding: Secondary | ICD-10-CM | POA: Insufficient documentation

## 2021-05-25 DIAGNOSIS — Z888 Allergy status to other drugs, medicaments and biological substances status: Secondary | ICD-10-CM | POA: Insufficient documentation

## 2021-05-25 DIAGNOSIS — Z96611 Presence of right artificial shoulder joint: Secondary | ICD-10-CM | POA: Insufficient documentation

## 2021-05-25 DIAGNOSIS — Z79899 Other long term (current) drug therapy: Secondary | ICD-10-CM | POA: Insufficient documentation

## 2021-05-25 DIAGNOSIS — E039 Hypothyroidism, unspecified: Secondary | ICD-10-CM | POA: Diagnosis not present

## 2021-05-25 DIAGNOSIS — Z7901 Long term (current) use of anticoagulants: Secondary | ICD-10-CM | POA: Insufficient documentation

## 2021-05-25 DIAGNOSIS — K2100 Gastro-esophageal reflux disease with esophagitis, without bleeding: Secondary | ICD-10-CM | POA: Insufficient documentation

## 2021-05-25 DIAGNOSIS — R1310 Dysphagia, unspecified: Secondary | ICD-10-CM | POA: Diagnosis present

## 2021-05-25 DIAGNOSIS — G4733 Obstructive sleep apnea (adult) (pediatric): Secondary | ICD-10-CM | POA: Insufficient documentation

## 2021-05-25 DIAGNOSIS — I4891 Unspecified atrial fibrillation: Secondary | ICD-10-CM | POA: Diagnosis not present

## 2021-05-25 DIAGNOSIS — Z9221 Personal history of antineoplastic chemotherapy: Secondary | ICD-10-CM | POA: Insufficient documentation

## 2021-05-25 DIAGNOSIS — Z8673 Personal history of transient ischemic attack (TIA), and cerebral infarction without residual deficits: Secondary | ICD-10-CM | POA: Insufficient documentation

## 2021-05-25 DIAGNOSIS — Z9884 Bariatric surgery status: Secondary | ICD-10-CM | POA: Insufficient documentation

## 2021-05-25 DIAGNOSIS — Z791 Long term (current) use of non-steroidal anti-inflammatories (NSAID): Secondary | ICD-10-CM | POA: Diagnosis not present

## 2021-05-25 DIAGNOSIS — Z853 Personal history of malignant neoplasm of breast: Secondary | ICD-10-CM | POA: Insufficient documentation

## 2021-05-25 DIAGNOSIS — Z923 Personal history of irradiation: Secondary | ICD-10-CM | POA: Diagnosis not present

## 2021-05-25 HISTORY — PX: ESOPHAGOGASTRODUODENOSCOPY: SHX5428

## 2021-05-25 HISTORY — PX: COLONOSCOPY WITH PROPOFOL: SHX5780

## 2021-05-25 SURGERY — EGD (ESOPHAGOGASTRODUODENOSCOPY)
Anesthesia: General

## 2021-05-25 MED ORDER — PROPOFOL 10 MG/ML IV BOLUS
INTRAVENOUS | Status: DC | PRN
  Administered 2021-05-25: 10 mg via INTRAVENOUS
  Administered 2021-05-25: 20 mg via INTRAVENOUS
  Administered 2021-05-25 (×2): 10 mg via INTRAVENOUS

## 2021-05-25 MED ORDER — MIDAZOLAM HCL 2 MG/2ML IJ SOLN
INTRAMUSCULAR | Status: AC
  Filled 2021-05-25: qty 2

## 2021-05-25 MED ORDER — FENTANYL CITRATE (PF) 100 MCG/2ML IJ SOLN
INTRAMUSCULAR | Status: DC | PRN
  Administered 2021-05-25 (×4): 25 ug via INTRAVENOUS

## 2021-05-25 MED ORDER — MIDAZOLAM HCL 2 MG/2ML IJ SOLN
INTRAMUSCULAR | Status: DC | PRN
  Administered 2021-05-25 (×2): 1 mg via INTRAVENOUS

## 2021-05-25 MED ORDER — FENTANYL CITRATE (PF) 100 MCG/2ML IJ SOLN
INTRAMUSCULAR | Status: AC
  Filled 2021-05-25: qty 2

## 2021-05-25 MED ORDER — LIDOCAINE HCL (CARDIAC) PF 100 MG/5ML IV SOSY
PREFILLED_SYRINGE | INTRAVENOUS | Status: DC | PRN
  Administered 2021-05-25: 50 mg via INTRAVENOUS

## 2021-05-25 MED ORDER — SODIUM CHLORIDE 0.9 % IV SOLN: INTRAVENOUS | Status: DC

## 2021-05-25 MED ORDER — PROPOFOL 500 MG/50ML IV EMUL
INTRAVENOUS | Status: DC | PRN
  Administered 2021-05-25: 50 ug/kg/min via INTRAVENOUS

## 2021-05-25 NOTE — Op Note (Signed)
[facility] Patient Name: Suzanne Dixon Procedure Date: 05/25/2021 9:49 AM MRN: [id number] Account #: [id number] Date of Birth: Nov 26, 1950 Admit Type: Outpatient Age: 70 Room: [facility] ENDO ROOM 1 Gender: Female Note Status: Finalized Instrument Name: Jasper Riling [id number] Procedure:             Colonoscopy Indications:           Rectal bleeding Providers:             Andrey Farmer MD, MD Referring MD:          Ramonita Lab, MD (Referring MD) Medicines:             Monitored Anesthesia Care Complications:         No immediate complications. Procedure:             Pre-Anesthesia Assessment:                        - Prior to the procedure, a History and Physical was                         performed, and patient medications and allergies were                         reviewed. The patient is competent. The risks and                         benefits of the procedure and the sedation options and                         risks were discussed with the patient. All questions                         were answered and informed consent was obtained.                         Patient identification and proposed procedure were                         verified by the physician, the nurse, the anesthetist                         and the technician in the endoscopy suite. Mental                         Status Examination: alert and oriented. Airway                         Examination: normal oropharyngeal airway and neck                         mobility. Respiratory Examination: clear to                         auscultation. CV Examination: normal. Prophylactic                         Antibiotics: The patient does not require prophylactic  antibiotics. Prior Anticoagulants: The patient has                         taken Coumadin (warfarin), last dose was 5 days prior                         to procedure. ASA Grade Assessment: III - A patient                          with severe systemic disease. After reviewing the                         risks and benefits, the patient was deemed in                         satisfactory condition to undergo the procedure. The                         anesthesia plan was to use monitored anesthesia care                         (MAC). Immediately prior to administration of                         medications, the patient was re-assessed for adequacy                         to receive sedatives. The heart rate, respiratory                         rate, oxygen saturations, blood pressure, adequacy of                         pulmonary ventilation, and response to care were                         monitored throughout the procedure. The physical                         status of the patient was re-assessed after the                         procedure.                        After obtaining informed consent, the colonoscope was                         passed under direct vision. Throughout the procedure,                         the patient's blood pressure, pulse, and oxygen                         saturations were monitored continuously. The                         Colonoscope was introduced through the anus and  advanced to the the cecum, identified by appendiceal                         orifice and ileocecal valve. The colonoscopy was                         performed without difficulty. The patient tolerated                         the procedure well. The quality of the bowel                         preparation was adequate to identify polyps. Findings:      The perianal and digital rectal examinations were normal.      Multiple small-mouthed diverticula were found in the sigmoid colon.      Internal hemorrhoids were found during retroflexion. The hemorrhoids       were Grade II (internal hemorrhoids that prolapse but reduce       spontaneously). Impression:            - Diverticulosis  in the sigmoid colon.                        - Internal hemorrhoids.                        - No specimens collected. Recommendation:        - Repeat colonoscopy is not recommended due to current                         age (29 years or older) for screening purposes.                        - Return to referring physician as previously                         scheduled.                        - Resume previous diet.                        - Discharge patient to home.                        - Resume Coumadin (warfarin) at prior dose today. Procedure Code(s):     --- Professional ---                        [id number], Colonoscopy, flexible; diagnostic, including                         collection of specimen(s) by brushing or washing, when                         performed (separate procedure) Diagnosis Code(s):     --- Professional ---                        K64.1, Second degree hemorrhoids  K62.5, Hemorrhage of anus and rectum                        K57.30, Diverticulosis of large intestine without                         perforation or abscess without bleeding CPT copyright 2019 American Medical Association. All rights reserved. The codes documented in this report are preliminary and upon coder review may  be revised to meet current compliance requirements. Andrey Farmer MD, MD 05/25/2021 11:10:00 AM Number of Addenda: 0 Note Initiated On: 05/25/2021 9:49 AM Scope Withdrawal Time: 0 hours 7 minutes 52 seconds  Total Procedure Duration: 0 hours 16 minutes 47 seconds  Estimated Blood Loss:  Estimated blood loss: none.      [facility]

## 2021-05-25 NOTE — Transfer of Care (Signed)
Immediate Anesthesia Transfer of Care Note  Patient: Suzanne Dixon  Procedure(s) Performed: ESOPHAGOGASTRODUODENOSCOPY (EGD) COLONOSCOPY WITH PROPOFOL  Patient Location: PACU  Anesthesia Type:General  Level of Consciousness: sedated  Airway & Oxygen Therapy: Patient Spontanous Breathing and Patient connected to nasal cannula oxygen  Post-op Assessment: Report given to RN and Post -op Vital signs reviewed and stable  Post vital signs: Reviewed and stable  Last Vitals:  Vitals Value Taken Time  BP    Temp    Pulse 81 05/25/21 1103  Resp 17 05/25/21 1104  SpO2 100 % 05/25/21 1103  Vitals shown include unvalidated device data.  Last Pain:  Vitals:   05/25/21 0947  TempSrc: Temporal  PainSc: 0-No pain         Complications: No notable events documented.

## 2021-05-25 NOTE — Op Note (Signed)
[facility] Patient Name: Suzanne Dixon Procedure Date: 05/25/2021 9:49 AM MRN: [id number] Account #: [id number] Date of Birth: 03/05/1951 Admit Type: Outpatient Age: 70 Room: [facility] ENDO ROOM 1 Gender: Female Note Status: Finalized Instrument Name: Upper Endoscope [id number] Procedure:             Upper GI endoscopy Indications:           Dysphagia Providers:             Andrey Farmer MD, MD Referring MD:          Ramonita Lab, MD (Referring MD) Medicines:             Monitored Anesthesia Care Complications:         No immediate complications. Estimated blood loss:                         Minimal. Procedure:             Pre-Anesthesia Assessment:                        - Prior to the procedure, a History and Physical was                         performed, and patient medications and allergies were                         reviewed. The patient is competent. The risks and                         benefits of the procedure and the sedation options and                         risks were discussed with the patient. All questions                         were answered and informed consent was obtained.                         Patient identification and proposed procedure were                         verified by the physician, the nurse, the anesthetist                         and the technician in the endoscopy suite. Mental                         Status Examination: alert and oriented. Airway                         Examination: normal oropharyngeal airway and neck                         mobility. Respiratory Examination: clear to                         auscultation. CV Examination: normal. Prophylactic  Antibiotics: The patient does not require prophylactic                         antibiotics. Prior Anticoagulants: The patient has                         taken Coumadin (warfarin), last dose was 5 days prior                          to procedure. ASA Grade Assessment: III - A patient                         with severe systemic disease. After reviewing the                         risks and benefits, the patient was deemed in                         satisfactory condition to undergo the procedure. The                         anesthesia plan was to use monitored anesthesia care                         (MAC). Immediately prior to administration of                         medications, the patient was re-assessed for adequacy                         to receive sedatives. The heart rate, respiratory                         rate, oxygen saturations, blood pressure, adequacy of                         pulmonary ventilation, and response to care were                         monitored throughout the procedure. The physical                         status of the patient was re-assessed after the                         procedure.                        After obtaining informed consent, the endoscope was                         passed under direct vision. Throughout the procedure,                         the patient's blood pressure, pulse, and oxygen                         saturations were monitored continuously. The  Endosonoscope was introduced through the mouth, and                         advanced to the second part of duodenum. The upper GI                         endoscopy was accomplished without difficulty. The                         patient tolerated the procedure well. Findings:      No endoscopic abnormality was evident in the esophagus to explain the       patient's complaint of dysphagia. Biopsies were obtained from the       proximal and distal esophagus with cold forceps for histology of       suspected eosinophilic esophagitis. Estimated blood loss was minimal.      Evidence of a Roux-en-Y gastrojejunostomy was found. The gastrojejunal       anastomosis was characterized by healthy  appearing mucosa.      Normal mucosa was found in the entire examined stomach.      The examined jejunum was normal. Impression:            - No endoscopic esophageal abnormality to explain                         patient's dysphagia. Biopsied.                        - Roux-en-Y gastrojejunostomy with gastrojejunal                         anastomosis characterized by healthy appearing mucosa.                        - Normal mucosa was found in the entire stomach.                        - Normal examined jejunum. Recommendation:        - Perform a colonoscopy today. Procedure Code(s):     --- Professional ---                        [phone], Esophagogastroduodenoscopy, flexible,                         transoral; with biopsy, single or multiple Diagnosis Code(s):     --- Professional ---                        R13.10, Dysphagia, unspecified                        Z98.0, Intestinal bypass and anastomosis status CPT copyright 2019 American Medical Association. All rights reserved. The codes documented in this report are preliminary and upon coder review may  be revised to meet current compliance requirements. Andrey Farmer MD, MD 05/25/2021 11:00:02 AM Number of Addenda: 0 Note Initiated On: 05/25/2021 9:49 AM Estimated Blood Loss:  Estimated blood loss was minimal.      [facility]

## 2021-05-25 NOTE — Anesthesia Preprocedure Evaluation (Signed)
Anesthesia Evaluation  Patient identified by MRN, date of birth, ID band Patient awake    Reviewed: Allergy & Precautions, NPO status , Patient's Chart, lab work & pertinent test results  History of Anesthesia Complications (+) history of anesthetic complications  Airway Mallampati: III  TM Distance: <3 FB Neck ROM: full    Dental  (+) Chipped, Missing   Pulmonary shortness of breath, sleep apnea ,    Pulmonary exam normal        Cardiovascular Exercise Tolerance: Good hypertension, Normal cardiovascular exam     Neuro/Psych PSYCHIATRIC DISORDERS  Neuromuscular disease CVA    GI/Hepatic Neg liver ROS, GERD  Medicated and Controlled,  Endo/Other  Hypothyroidism   Renal/GU negative Renal ROS  negative genitourinary   Musculoskeletal   Abdominal   Peds  Hematology negative hematology ROS (+)   Anesthesia Other Findings Past Medical History: No date: A-fib ([place]) No date: Anemia No date: B12 deficiency 03/31/2013: Breast cancer ([place])     Comment:  Right - chemo- mastectomy 1991: Breast cancer ([place])     Comment:  Rt.- radiation [id number]: Complication of anesthesia     Comment:  Recent years with general anesthesia had itching               following surgery No date: DDD (degenerative disc disease), cervical No date: Depression No date: Diverticulitis No date: Dyspnea No date: Edema of both legs No date: GERD (gastroesophageal reflux disease) No date: Gout 2014: H/O mastectomy     Comment:  per patient 62: H/O: cesarean section     Comment:  per patient report 81: H/O: hysterectomy     Comment:  per patient No date: Hydradenitis No date: Hypertension No date: Hypothyroidism No date: Neuropathy 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[place]) No date: Parkinson's disease ([place]) No date: Personal history of chemotherapy No date: Personal history of radiation therapy 2002: Ruptured cervical disc     Comment:   per patient  No date: Sleep apnea     Comment:  cpap machine No date: Super obese No date: Tachycardia  Past Surgical History: No date: ABDOMINAL HYSTERECTOMY 2002: BACK SURGERY     Comment:  plate and 2 screws in neck [id number]: BREAST BIOPSY; Right     Comment:  Positive No date: BREAST CYST ASPIRATION; Left 2014: BREAST SURGERY; Right     Comment:  mastectomy 05/17/2019: CATARACT EXTRACTION W/PHACO; Left     Comment:  Procedure: CATARACT EXTRACTION PHACO AND INTRAOCULAR               LENS PLACEMENT (IOC);  Surgeon: Eulogio Bear, MD;                Location: [facility] ORS;  Service: Ophthalmology;  Laterality:              Left;  [place] 00:28 CDE 2.15 FLUID PACK LOT # [id number] 06/14/2019: CATARACT EXTRACTION W/PHACO; Right     Comment:  Procedure: CATARACT EXTRACTION PHACO AND INTRAOCULAR               LENS PLACEMENT (IOC) RIGHT;  Surgeon: Eulogio Bear,              MD;  Location: [facility] ORS;  Service: Ophthalmology;                Laterality: Right;  [place] 00:35.0 CDE 2.37 Fluid Pack Lot               [id number] No date: CESAREAN SECTION  04/09/2012: CHOLECYSTECTOMY 06/22/2015: COLONOSCOPY WITH PROPOFOL; N/A     Comment:  Procedure: COLONOSCOPY WITH PROPOFOL;  Surgeon: Manya Silvas, MD;  Location: [facility] ENDOSCOPY;  Service:               Endoscopy;  Laterality: N/A; No date: ESOPHAGOGASTRODUODENOSCOPY No date: EYE SURGERY No date: LAPAROSCOPIC GASTRIC BYPASS No date: MASTECTOMY; Right No date: MASTECTOMY MODIFIED RADICAL No date: mastectomy partial      Comment:  lumpectomy No date: NEUROPLASTY / TRANSPOSITION ULNAR NERVE AT ELBOW No date: OOPHORECTOMY 05/13/2015: PORT-A-CATH REMOVAL; Left     Comment:  Procedure: REMOVAL PORT-A-CATH LEFT CHEST ;  Surgeon:               Florene Glen, MD;  Location: [facility] ORS;  Service:               General;  Laterality: Left; No date: REDUCTION MAMMAPLASTY; Left 12/03/2020: REVERSE SHOULDER ARTHROPLASTY; Right      Comment:  Procedure: REVERSE SHOULDER ARTHROPLASTY;  Surgeon:               Corky Mull, MD;  Location: [facility] ORS;  Service:               Orthopedics;  Laterality: Right; 01/26/2009: ROUX-EN-Y GASTRIC BYPASS 01/03/2017: SHOULDER ARTHROSCOPY WITH OPEN ROTATOR CUFF REPAIR; Right     Comment:  Procedure: SHOULDER ARTHROSCOPY WITH OPEN ROTATOR CUFF               REPAIR;  Surgeon: Corky Mull, MD;  Location: [facility] ORS;               Service: Orthopedics;  Laterality: Right; 01/03/2017: SHOULDER ARTHROSCOPY WITH SUBACROMIAL DECOMPRESSION,  ROTATOR CUFF REPAIR AND BICEP TENDON REPAIR; Right     Comment:  Procedure: SHOULDER ARTHROSCOPY WITH SUBACROMIAL               DECOMPRESSION, ROTATOR CUFF REPAIR AND BICEP TENDON               REPAIR;  Surgeon: Corky Mull, MD;  Location: [facility] ORS;               Service: Orthopedics;  Laterality: Right;  Limited               debridement No date: SPINE SURGERY  BMI    Body Mass Index: 46.35 kg/m      Reproductive/Obstetrics negative OB ROS                             Anesthesia Physical Anesthesia Plan  ASA: 3  Anesthesia Plan: General   Post-op Pain Management:    Induction: Intravenous  PONV Risk Score and Plan: Propofol infusion and TIVA  Airway Management Planned: Natural Airway and Nasal Cannula  Additional Equipment:   Intra-op Plan:   Post-operative Plan:   Informed Consent: I have reviewed the patients History and Physical, chart, labs and discussed the procedure including the risks, benefits and alternatives for the proposed anesthesia with the patient or authorized representative who has indicated his/her understanding and acceptance.     Dental Advisory Given  Plan Discussed with: Anesthesiologist, CRNA and Surgeon  Anesthesia Plan Comments: (Patient consented for risks of anesthesia including but not limited to:  - adverse reactions to medications - risk of airway placement if required -  damage to eyes, teeth,  lips or other oral mucosa - nerve damage due to positioning  - sore throat or hoarseness - Damage to heart, brain, nerves, lungs, other parts of body or loss of life  Patient voiced understanding.)        Anesthesia Quick Evaluation

## 2021-05-25 NOTE — H&P (Signed)
Outpatient short stay form Pre-procedure 05/25/2021  Lesly Rubenstein, MD  Primary Physician: Adin Hector, MD  Reason for visit:  Dysphagia/Rectal bleeding  History of present illness:   70 y/o lady with history of multiple medical problems including lymphedema, a. Fib on coumadin (last dose 5 days ago), hypothyroidism, and OSA here for EGD for dysphagia and colonoscopy for small volume hematochezia. Had normal colonoscopy 6 years ago. Dysphagia to solids and liquids.    Current Facility-Administered Medications:    0.9 %  sodium chloride infusion, , Intravenous, Continuous, Brendaly Townsel, Hilton Cork, MD, Last Rate: 20 mL/hr at 05/25/21 1011, New Bag at 05/25/21 1011  Medications Prior to Admission  Medication Sig Dispense Refill Last Dose   allopurinol (ZYLOPRIM) 300 MG tablet Take 300 mg by mouth daily.    05/24/2021   Calcium Carb-Cholecalciferol 600-400 MG-UNIT TABS Take 1 tablet by mouth 2 (two) times daily with a meal.   05/24/2021   carbidopa-levodopa (SINEMET IR) 25-100 MG tablet Take 1.5 tablets by mouth 3 (three) times daily.   05/24/2021   DULoxetine (CYMBALTA) 60 MG capsule Take 60 mg by mouth 2 (two) times daily.   05/24/2021   furosemide (LASIX) 40 MG tablet Take 40 mg by mouth daily.   05/24/2021   gabapentin (NEURONTIN) 600 MG tablet Take 600 mg by mouth 4 (four) times daily.  11 05/24/2021   levothyroxine (SYNTHROID, LEVOTHROID) 50 MCG tablet Take 50 mcg by mouth daily before breakfast.   05/24/2021   Magnesium 400 MG CAPS Take 400 mg by mouth daily.   05/24/2021   meloxicam (MOBIC) 15 MG tablet Take 15 mg by mouth daily.   05/24/2021   metoprolol (LOPRESSOR) 100 MG tablet Take 200 mg by mouth 2 (two) times daily.    05/24/2021   omeprazole (PRILOSEC) 20 MG capsule Take 20 mg by mouth daily.    05/24/2021   oxybutynin (DITROPAN) 5 MG tablet Take 5 mg by mouth 2 (two) times daily.   05/24/2021   potassium chloride (K-DUR,KLOR-CON) 10 MEQ tablet Take 10 mEq by mouth 3 (three) times daily.     05/24/2021   vitamin B-12 (CYANOCOBALAMIN) 1000 MCG tablet Take 1,000 mcg by mouth daily.   Past Week   warfarin (COUMADIN) 1 MG tablet Take 1 mg by mouth at bedtime. (take with '6mg'$  tablet to equal '7mg'$  total)   05/20/2021   warfarin (COUMADIN) 6 MG tablet Take 6 mg by mouth at bedtime. (take with '1mg'$  tablet to equal '7mg'$  total)   05/20/2021   DULoxetine (CYMBALTA) 60 MG capsule Take 1 capsule by mouth 2 (two) times daily.      fluticasone (FLONASE) 50 MCG/ACT nasal spray Place into both nostrils daily. (Patient not taking: Reported on 05/25/2021)   Not Taking   Homeopathic Products (PSORIASIS/ECZEMA RELIEF EX) Apply 1 application topically daily as needed (eczema).      HYDROcodone-acetaminophen (NORCO/VICODIN) 5-325 MG tablet Take 1-2 tablets by mouth every 4 (four) hours as needed for moderate pain (pain score 4-6). 60 tablet 0    hydrocortisone 2.5 % cream Apply topically 2 (two) times daily.      Multiple Vitamins tablet Take 2 tablets by mouth daily.       potassium chloride (KLOR-CON) 10 MEQ tablet Take 10 mEq by mouth 3 (three) times daily.      traMADol (ULTRAM) 50 MG tablet Take 1 tablet (50 mg total) by mouth every 6 (six) hours as needed for moderate pain. 30 tablet 0  triamcinolone (KENALOG) 0.025 % cream Apply 1 application topically 2 (two) times daily.        Allergies  Allergen Reactions   Advair Diskus [Fluticasone-Salmeterol] Other (See Comments)    Joint pain    Alendronate     Muscle pain   Singulair [Montelukast] Other (See Comments)    "muscle pain"   Sulfa Antibiotics Hives   Venlafaxine Other (See Comments)    "altered mental status/tremors"     Past Medical History:  Diagnosis Date   A-fib ([place])    Anemia    B12 deficiency    Breast cancer ([place]) 03/31/2013   Right - chemo- mastectomy   Breast cancer ([place]) 1991   Rt.- radiation   Complication of anesthesia 01/2009   Recent years with general anesthesia had itching following surgery   DDD (degenerative disc  disease), cervical    Depression    Diverticulitis    Dyspnea    Edema of both legs    GERD (gastroesophageal reflux disease)    Gout    H/O mastectomy 2014   per patient   H/O: cesarean section 1987   per patient report   H/O: hysterectomy 1996   per patient   Hydradenitis    Hypertension    Hypothyroidism    Neuropathy    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    Physical Exam  Gen: Alert, oriented. Appears stated age.  HEENT: PERRLA. Lungs: No respiratory distress CV: RRR Abd: soft, benign, no masses Ext: No edema    Planned procedures: Proceed with EGD/colonoscopy. The patient understands the nature of the planned procedure, indications, risks, alternatives and potential complications including but not limited to bleeding, infection, perforation, damage to internal organs and possible oversedation/side effects from anesthesia. The patient agrees and gives consent to proceed.  Please refer to procedure notes for findings, recommendations and patient disposition/instructions.     Lesly Rubenstein, MD [facility] Gastroenterology

## 2021-05-25 NOTE — Interval H&P Note (Signed)
History and Physical Interval Note:  05/25/2021 10:15 AM  [name]  has presented today for surgery, with the diagnosis of DYSPHAGIA,RECTAL BLEEDING.  The various methods of treatment have been discussed with the patient and family. After consideration of risks, benefits and other options for treatment, the patient has consented to  Procedure(s): ESOPHAGOGASTRODUODENOSCOPY (EGD) (N/A) COLONOSCOPY WITH PROPOFOL (N/A) as a surgical intervention.  The patient's history has been reviewed, patient examined, no change in status, stable for surgery.  I have reviewed the patient's chart and labs.  Questions were answered to the patient's satisfaction.     Suzanne Dixon  Ok to proceed with EGD/Colonoscopy

## 2021-05-26 ENCOUNTER — Encounter: Payer: Self-pay | Admitting: Gastroenterology

## 2021-05-26 NOTE — Anesthesia Postprocedure Evaluation (Signed)
Anesthesia Post Note  Patient: Suzanne Dixon  Procedure(s) Performed: ESOPHAGOGASTRODUODENOSCOPY (EGD) COLONOSCOPY WITH PROPOFOL  Patient location during evaluation: Endoscopy Anesthesia Type: General Level of consciousness: awake and alert Pain management: pain level controlled Vital Signs Assessment: post-procedure vital signs reviewed and stable Respiratory status: spontaneous breathing, nonlabored ventilation, respiratory function stable and patient connected to nasal cannula oxygen Cardiovascular status: blood pressure returned to baseline and stable Postop Assessment: no apparent nausea or vomiting Anesthetic complications: no   No notable events documented.   Last Vitals:  Vitals:   05/25/21 1123 05/25/21 1131  BP: (!) 168/99 (!) 172/97  Pulse: 81 81  Resp: (!) 24 17  Temp:    SpO2: 94% 93%    Last Pain:  Vitals:   05/25/21 1131  TempSrc:   PainSc: 0-No pain                 Precious Haws Stevana Dufner

## 2021-05-27 LAB — SURGICAL PATHOLOGY

## 2021-06-03 ENCOUNTER — Telehealth (INDEPENDENT_AMBULATORY_CARE_PROVIDER_SITE_OTHER): Payer: Self-pay

## 2021-06-03 NOTE — Telephone Encounter (Signed)
Merry Proud from [facility] called  and wanted to know could he get a verbal order for the pt for unna boots please advise.

## 2021-06-03 NOTE — Telephone Encounter (Signed)
That's fine

## 2021-06-19 ENCOUNTER — Inpatient Hospital Stay
Admission: EM | Admit: 2021-06-19 | Discharge: 2021-07-02 | DRG: 177 | Disposition: A | Discharge status: Skilled Nursing Facility | Payer: Medicare HMO | Attending: Hospitalist | Admitting: Hospitalist

## 2021-06-19 ENCOUNTER — Other Ambulatory Visit: Payer: Self-pay

## 2021-06-19 ENCOUNTER — Emergency Department: Payer: Medicare HMO

## 2021-06-19 DIAGNOSIS — Z8249 Family history of ischemic heart disease and other diseases of the circulatory system: Secondary | ICD-10-CM

## 2021-06-19 DIAGNOSIS — C91Z0 Other lymphoid leukemia not having achieved remission: Secondary | ICD-10-CM | POA: Diagnosis present

## 2021-06-19 DIAGNOSIS — Z9842 Cataract extraction status, left eye: Secondary | ICD-10-CM

## 2021-06-19 DIAGNOSIS — M109 Gout, unspecified: Secondary | ICD-10-CM | POA: Diagnosis present

## 2021-06-19 DIAGNOSIS — E538 Deficiency of other specified B group vitamins: Secondary | ICD-10-CM | POA: Diagnosis present

## 2021-06-19 DIAGNOSIS — Z923 Personal history of irradiation: Secondary | ICD-10-CM

## 2021-06-19 DIAGNOSIS — C50411 Malignant neoplasm of upper-outer quadrant of right female breast: Secondary | ICD-10-CM | POA: Diagnosis not present

## 2021-06-19 DIAGNOSIS — Z9049 Acquired absence of other specified parts of digestive tract: Secondary | ICD-10-CM

## 2021-06-19 DIAGNOSIS — G63 Polyneuropathy in diseases classified elsewhere: Secondary | ICD-10-CM | POA: Diagnosis present

## 2021-06-19 DIAGNOSIS — F331 Major depressive disorder, recurrent, moderate: Secondary | ICD-10-CM | POA: Diagnosis present

## 2021-06-19 DIAGNOSIS — R791 Abnormal coagulation profile: Secondary | ICD-10-CM | POA: Diagnosis present

## 2021-06-19 DIAGNOSIS — I480 Paroxysmal atrial fibrillation: Secondary | ICD-10-CM | POA: Diagnosis present

## 2021-06-19 DIAGNOSIS — Z171 Estrogen receptor negative status [ER-]: Secondary | ICD-10-CM | POA: Diagnosis not present

## 2021-06-19 DIAGNOSIS — E872 Acidosis, unspecified: Secondary | ICD-10-CM | POA: Diagnosis present

## 2021-06-19 DIAGNOSIS — I10 Essential (primary) hypertension: Secondary | ICD-10-CM | POA: Diagnosis present

## 2021-06-19 DIAGNOSIS — U071 COVID-19: Secondary | ICD-10-CM | POA: Diagnosis present

## 2021-06-19 DIAGNOSIS — Z882 Allergy status to sulfonamides status: Secondary | ICD-10-CM

## 2021-06-19 DIAGNOSIS — G20 Parkinson's disease: Secondary | ICD-10-CM | POA: Diagnosis present

## 2021-06-19 DIAGNOSIS — Z9011 Acquired absence of right breast and nipple: Secondary | ICD-10-CM | POA: Diagnosis not present

## 2021-06-19 DIAGNOSIS — I639 Cerebral infarction, unspecified: Secondary | ICD-10-CM | POA: Diagnosis present

## 2021-06-19 DIAGNOSIS — K0889 Other specified disorders of teeth and supporting structures: Secondary | ICD-10-CM | POA: Diagnosis present

## 2021-06-19 DIAGNOSIS — Z9841 Cataract extraction status, right eye: Secondary | ICD-10-CM

## 2021-06-19 DIAGNOSIS — I248 Other forms of acute ischemic heart disease: Secondary | ICD-10-CM | POA: Diagnosis present

## 2021-06-19 DIAGNOSIS — K219 Gastro-esophageal reflux disease without esophagitis: Secondary | ICD-10-CM | POA: Diagnosis present

## 2021-06-19 DIAGNOSIS — E860 Dehydration: Secondary | ICD-10-CM | POA: Diagnosis present

## 2021-06-19 DIAGNOSIS — Z791 Long term (current) use of non-steroidal anti-inflammatories (NSAID): Secondary | ICD-10-CM

## 2021-06-19 DIAGNOSIS — N390 Urinary tract infection, site not specified: Secondary | ICD-10-CM | POA: Diagnosis present

## 2021-06-19 DIAGNOSIS — B9689 Other specified bacterial agents as the cause of diseases classified elsewhere: Secondary | ICD-10-CM | POA: Diagnosis present

## 2021-06-19 DIAGNOSIS — G9341 Metabolic encephalopathy: Secondary | ICD-10-CM | POA: Diagnosis present

## 2021-06-19 DIAGNOSIS — Z79899 Other long term (current) drug therapy: Secondary | ICD-10-CM

## 2021-06-19 DIAGNOSIS — Z6841 Body Mass Index (BMI) 40.0 and over, adult: Secondary | ICD-10-CM

## 2021-06-19 DIAGNOSIS — M25561 Pain in right knee: Secondary | ICD-10-CM | POA: Diagnosis present

## 2021-06-19 DIAGNOSIS — Z888 Allergy status to other drugs, medicaments and biological substances status: Secondary | ICD-10-CM

## 2021-06-19 DIAGNOSIS — R4182 Altered mental status, unspecified: Secondary | ICD-10-CM | POA: Diagnosis present

## 2021-06-19 DIAGNOSIS — Z803 Family history of malignant neoplasm of breast: Secondary | ICD-10-CM | POA: Diagnosis not present

## 2021-06-19 DIAGNOSIS — G4730 Sleep apnea, unspecified: Secondary | ICD-10-CM | POA: Diagnosis present

## 2021-06-19 DIAGNOSIS — Z7901 Long term (current) use of anticoagulants: Secondary | ICD-10-CM

## 2021-06-19 DIAGNOSIS — R778 Other specified abnormalities of plasma proteins: Secondary | ICD-10-CM | POA: Diagnosis not present

## 2021-06-19 DIAGNOSIS — I959 Hypotension, unspecified: Secondary | ICD-10-CM | POA: Diagnosis not present

## 2021-06-19 DIAGNOSIS — R410 Disorientation, unspecified: Secondary | ICD-10-CM | POA: Diagnosis not present

## 2021-06-19 DIAGNOSIS — E039 Hypothyroidism, unspecified: Secondary | ICD-10-CM | POA: Diagnosis present

## 2021-06-19 DIAGNOSIS — Z853 Personal history of malignant neoplasm of breast: Secondary | ICD-10-CM | POA: Diagnosis not present

## 2021-06-19 DIAGNOSIS — Z9221 Personal history of antineoplastic chemotherapy: Secondary | ICD-10-CM | POA: Diagnosis not present

## 2021-06-19 DIAGNOSIS — C50919 Malignant neoplasm of unspecified site of unspecified female breast: Secondary | ICD-10-CM | POA: Diagnosis present

## 2021-06-19 DIAGNOSIS — Z79890 Hormone replacement therapy (postmenopausal): Secondary | ICD-10-CM

## 2021-06-19 DIAGNOSIS — G8929 Other chronic pain: Secondary | ICD-10-CM | POA: Diagnosis present

## 2021-06-19 DIAGNOSIS — Z28310 Unvaccinated for covid-19: Secondary | ICD-10-CM

## 2021-06-19 DIAGNOSIS — Z8616 Personal history of COVID-19: Secondary | ICD-10-CM

## 2021-06-19 DIAGNOSIS — M25562 Pain in left knee: Secondary | ICD-10-CM | POA: Diagnosis present

## 2021-06-19 DIAGNOSIS — Z8673 Personal history of transient ischemic attack (TIA), and cerebral infarction without residual deficits: Secondary | ICD-10-CM

## 2021-06-19 DIAGNOSIS — Z90710 Acquired absence of both cervix and uterus: Secondary | ICD-10-CM

## 2021-06-19 DIAGNOSIS — Z961 Presence of intraocular lens: Secondary | ICD-10-CM | POA: Diagnosis present

## 2021-06-19 DIAGNOSIS — R600 Localized edema: Secondary | ICD-10-CM | POA: Diagnosis not present

## 2021-06-19 DIAGNOSIS — Z9884 Bariatric surgery status: Secondary | ICD-10-CM

## 2021-06-19 DIAGNOSIS — R000 Tachycardia, unspecified: Secondary | ICD-10-CM | POA: Diagnosis not present

## 2021-06-19 DIAGNOSIS — Z96611 Presence of right artificial shoulder joint: Secondary | ICD-10-CM | POA: Diagnosis present

## 2021-06-19 HISTORY — DX: Personal history of COVID-19: Z86.16

## 2021-06-19 LAB — COMPREHENSIVE METABOLIC PANEL
ALT: 17 U/L (ref 0–44)
AST: 37 U/L (ref 15–41)
Albumin: 3.5 g/dL (ref 3.5–5.0)
Alkaline Phosphatase: 68 U/L (ref 38–126)
Anion gap: 14 (ref 5–15)
BUN: 11 mg/dL (ref 8–23)
CO2: 26 mmol/L (ref 22–32)
Calcium: 9 mg/dL (ref 8.9–10.3)
Chloride: 101 mmol/L (ref 98–111)
Creatinine, Ser: 0.98 mg/dL (ref 0.44–1.00)
GFR, Estimated: >60 mL/min (ref >60)
Glucose, Bld: 115 mg/dL — ABNORMAL HIGH (ref 70–99)
Potassium: 3.9 mmol/L (ref 3.5–5.1)
Sodium: 141 mmol/L (ref 135–145)
Total Bilirubin: 1.2 mg/dL (ref 0.3–1.2)
Total Protein: 7.1 g/dL (ref 6.5–8.1)

## 2021-06-19 LAB — URINALYSIS, ROUTINE W REFLEX MICROSCOPIC
Bilirubin Urine: NEGATIVE
Glucose, UA: NEGATIVE mg/dL
Hgb urine dipstick: NEGATIVE
Ketones, ur: NEGATIVE mg/dL
Nitrite: NEGATIVE
Protein, ur: 30 mg/dL — AB
Specific Gravity, Urine: 1.038 — ABNORMAL HIGH (ref 1.005–1.030)
pH: 9 — ABNORMAL HIGH (ref 5.0–8.0)

## 2021-06-19 LAB — D-DIMER, QUANTITATIVE: D-Dimer, Quant: 1.02 ug/mL-FEU — ABNORMAL HIGH (ref 0.00–0.50)

## 2021-06-19 LAB — RESP PANEL BY RT-PCR (FLU A&B, COVID) ARPGX2
Influenza A by PCR: NEGATIVE
Influenza B by PCR: NEGATIVE
SARS Coronavirus 2 by RT PCR: POSITIVE — AB

## 2021-06-19 LAB — CBC WITH DIFFERENTIAL/PLATELET
Abs Immature Granulocytes: 0.02 10*3/uL (ref 0.00–0.07)
Basophils Absolute: 0 10*3/uL (ref 0.0–0.1)
Basophils Relative: 1 %
Eosinophils Absolute: 0 10*3/uL (ref 0.0–0.5)
Eosinophils Relative: 0 %
HCT: 41.1 % (ref 36.0–46.0)
Hemoglobin: 14.2 g/dL (ref 12.0–15.0)
Immature Granulocytes: 0 %
Lymphocytes Relative: 41 %
Lymphs Abs: 3.5 10*3/uL (ref 0.7–4.0)
MCH: 31.6 pg (ref 26.0–34.0)
MCHC: 34.5 g/dL (ref 30.0–36.0)
MCV: 91.3 fL (ref 80.0–100.0)
Monocytes Absolute: 0.7 10*3/uL (ref 0.1–1.0)
Monocytes Relative: 9 %
Neutro Abs: 4.2 10*3/uL (ref 1.7–7.7)
Neutrophils Relative %: 49 %
Platelets: 324 10*3/uL (ref 150–400)
RBC: 4.5 MIL/uL (ref 3.87–5.11)
RDW: 14 % (ref 11.5–15.5)
WBC: 8.5 10*3/uL (ref 4.0–10.5)
nRBC: 0 % (ref 0.0–0.2)

## 2021-06-19 LAB — PROCALCITONIN: Procalcitonin: <0.1 ng/mL

## 2021-06-19 LAB — LACTIC ACID, PLASMA
Lactic Acid, Venous: 3.2 mmol/L (ref 0.5–1.9)
Lactic Acid, Venous: 2 mmol/L (ref 0.5–1.9)
Lactic Acid, Venous: 1.3 mmol/L (ref 0.5–1.9)

## 2021-06-19 LAB — TROPONIN I (HIGH SENSITIVITY)
Troponin I (High Sensitivity): 100 ng/L (ref <18)
Troponin I (High Sensitivity): 93 ng/L — ABNORMAL HIGH (ref <18)
Troponin I (High Sensitivity): 106 ng/L (ref <18)
Troponin I (High Sensitivity): 119 ng/L (ref <18)

## 2021-06-19 LAB — PROTIME-INR
INR: 1.4 — ABNORMAL HIGH (ref 0.8–1.2)
Prothrombin Time: 17.2 seconds — ABNORMAL HIGH (ref 11.4–15.2)

## 2021-06-19 LAB — FIBRINOGEN: Fibrinogen: 600 mg/dL — ABNORMAL HIGH (ref 210–475)

## 2021-06-19 LAB — FERRITIN: Ferritin: 150 ng/mL (ref 11–307)

## 2021-06-19 MED ORDER — ONDANSETRON HCL 4 MG PO TABS: 4.0000 mg | ORAL_TABLET | Freq: Four times a day (QID) | ORAL | Status: DC | PRN

## 2021-06-19 MED ORDER — FUROSEMIDE 40 MG PO TABS
40.0000 mg | ORAL_TABLET | Freq: Every day | ORAL | Status: DC
  Administered 2021-06-20 – 2021-06-22 (×3): 40 mg via ORAL
  Filled 2021-06-19 (×3): qty 1

## 2021-06-19 MED ORDER — WARFARIN SODIUM 10 MG PO TABS
10.0000 mg | ORAL_TABLET | Freq: Once | ORAL | Status: AC
  Administered 2021-06-19: 10 mg via ORAL
  Filled 2021-06-19: qty 1

## 2021-06-19 MED ORDER — CARBIDOPA-LEVODOPA 25-100 MG PO TABS
1.5000 | ORAL_TABLET | Freq: Three times a day (TID) | ORAL | Status: DC
  Administered 2021-06-19 – 2021-07-02 (×38): 1.5 via ORAL
  Filled 2021-06-19 (×4): qty 2
  Filled 2021-06-19: qty 1.5
  Filled 2021-06-19 (×34): qty 2

## 2021-06-19 MED ORDER — METRONIDAZOLE 500 MG/100ML IV SOLN
500.0000 mg | Freq: Once | INTRAVENOUS | Status: AC
  Administered 2021-06-19: 500 mg via INTRAVENOUS
  Filled 2021-06-19: qty 100

## 2021-06-19 MED ORDER — OXYBUTYNIN CHLORIDE 5 MG PO TABS
5.0000 mg | ORAL_TABLET | Freq: Two times a day (BID) | ORAL | Status: DC
  Administered 2021-06-19 – 2021-07-02 (×26): 5 mg via ORAL
  Filled 2021-06-19 (×27): qty 1

## 2021-06-19 MED ORDER — METOPROLOL TARTRATE 50 MG PO TABS
200.0000 mg | ORAL_TABLET | Freq: Two times a day (BID) | ORAL | Status: DC
  Administered 2021-06-19 – 2021-06-26 (×8): 200 mg via ORAL
  Filled 2021-06-19 (×11): qty 4

## 2021-06-19 MED ORDER — VANCOMYCIN HCL IN DEXTROSE 1-5 GM/200ML-% IV SOLN: 1000.0000 mg | Freq: Once | INTRAVENOUS | Status: DC

## 2021-06-19 MED ORDER — SODIUM CHLORIDE 0.9 % IV SOLN
100.0000 mg | Freq: Every day | INTRAVENOUS | Status: AC
  Administered 2021-06-20 – 2021-06-23 (×4): 100 mg via INTRAVENOUS
  Filled 2021-06-19 (×2): qty 100
  Filled 2021-06-19 (×2): qty 20

## 2021-06-19 MED ORDER — SODIUM CHLORIDE 0.9 % IV SOLN
200.0000 mg | Freq: Once | INTRAVENOUS | Status: AC
  Administered 2021-06-19: 200 mg via INTRAVENOUS
  Filled 2021-06-19: qty 40
  Filled 2021-06-19: qty 200

## 2021-06-19 MED ORDER — IOHEXOL 350 MG/ML SOLN
100.0000 mL | Freq: Once | INTRAVENOUS | Status: AC | PRN
  Administered 2021-06-19: 100 mL via INTRAVENOUS

## 2021-06-19 MED ORDER — ALLOPURINOL 100 MG PO TABS
300.0000 mg | ORAL_TABLET | Freq: Every day | ORAL | Status: DC
  Administered 2021-06-20 – 2021-07-02 (×13): 300 mg via ORAL
  Filled 2021-06-19 (×13): qty 3

## 2021-06-19 MED ORDER — PANTOPRAZOLE SODIUM 40 MG PO TBEC
40.0000 mg | DELAYED_RELEASE_TABLET | Freq: Every day | ORAL | Status: DC
  Administered 2021-06-19 – 2021-07-02 (×14): 40 mg via ORAL
  Filled 2021-06-19 (×14): qty 1

## 2021-06-19 MED ORDER — GUAIFENESIN-DM 100-10 MG/5ML PO SYRP
10.0000 mL | ORAL_SOLUTION | ORAL | Status: DC | PRN
  Administered 2021-06-27 – 2021-06-28 (×2): 10 mL via ORAL
  Filled 2021-06-19: qty 10

## 2021-06-19 MED ORDER — WARFARIN - PHARMACIST DOSING INPATIENT: Freq: Every day | Status: DC

## 2021-06-19 MED ORDER — ENOXAPARIN SODIUM 120 MG/0.8ML IJ SOSY
1.0000 mg/kg | PREFILLED_SYRINGE | Freq: Two times a day (BID) | INTRAMUSCULAR | Status: DC
  Administered 2021-06-19 – 2021-06-22 (×7): 114 mg via SUBCUTANEOUS
  Filled 2021-06-19 (×8): qty 0.76

## 2021-06-19 MED ORDER — TRAZODONE HCL 50 MG PO TABS
50.0000 mg | ORAL_TABLET | Freq: Every evening | ORAL | Status: DC | PRN
  Filled 2021-06-19 (×2): qty 1

## 2021-06-19 MED ORDER — SODIUM CHLORIDE 0.9% FLUSH
3.0000 mL | Freq: Two times a day (BID) | INTRAVENOUS | Status: DC
  Administered 2021-06-19 – 2021-07-02 (×24): 3 mL via INTRAVENOUS

## 2021-06-19 MED ORDER — POTASSIUM CHLORIDE CRYS ER 10 MEQ PO TBCR
10.0000 meq | EXTENDED_RELEASE_TABLET | Freq: Three times a day (TID) | ORAL | Status: DC
  Administered 2021-06-19 – 2021-06-28 (×26): 10 meq via ORAL
  Filled 2021-06-19 (×27): qty 1

## 2021-06-19 MED ORDER — VITAMIN B-12 1000 MCG PO TABS
1000.0000 ug | ORAL_TABLET | Freq: Every day | ORAL | Status: DC
  Administered 2021-06-20 – 2021-07-02 (×12): 1000 ug via ORAL
  Filled 2021-06-19 (×13): qty 1

## 2021-06-19 MED ORDER — LEVOTHYROXINE SODIUM 50 MCG PO TABS
50.0000 ug | ORAL_TABLET | Freq: Every day | ORAL | Status: DC
  Administered 2021-06-20 – 2021-07-02 (×13): 50 ug via ORAL
  Filled 2021-06-19 (×13): qty 1

## 2021-06-19 MED ORDER — SODIUM CHLORIDE 0.9 % IV SOLN
2.0000 g | Freq: Once | INTRAVENOUS | Status: AC
  Administered 2021-06-19: 2 g via INTRAVENOUS
  Filled 2021-06-19: qty 2

## 2021-06-19 MED ORDER — OXYCODONE HCL 5 MG PO TABS: 5.0000 mg | ORAL_TABLET | ORAL | Status: DC | PRN

## 2021-06-19 MED ORDER — VANCOMYCIN HCL IN DEXTROSE 1-5 GM/200ML-% IV SOLN
1000.0000 mg | Freq: Once | INTRAVENOUS | Status: AC
  Administered 2021-06-19: 1000 mg via INTRAVENOUS
  Filled 2021-06-19: qty 200

## 2021-06-19 MED ORDER — ASPIRIN EC 325 MG PO TBEC
325.0000 mg | DELAYED_RELEASE_TABLET | Freq: Once | ORAL | Status: AC
  Administered 2021-06-19: 325 mg via ORAL
  Filled 2021-06-19: qty 1

## 2021-06-19 MED ORDER — ACETAMINOPHEN 325 MG PO TABS
650.0000 mg | ORAL_TABLET | Freq: Four times a day (QID) | ORAL | Status: DC | PRN
  Administered 2021-06-22 – 2021-06-24 (×3): 650 mg via ORAL
  Filled 2021-06-19 (×3): qty 2

## 2021-06-19 MED ORDER — DULOXETINE HCL 30 MG PO CPEP
60.0000 mg | ORAL_CAPSULE | Freq: Two times a day (BID) | ORAL | Status: DC
  Administered 2021-06-19 – 2021-07-02 (×26): 60 mg via ORAL
  Filled 2021-06-19 (×27): qty 2

## 2021-06-19 MED ORDER — WARFARIN SODIUM 6 MG PO TABS: 7.0000 mg | ORAL_TABLET | Freq: Every day | ORAL | Status: DC

## 2021-06-19 MED ORDER — POLYETHYLENE GLYCOL 3350 17 G PO PACK
17.0000 g | PACK | Freq: Every day | ORAL | Status: DC | PRN
  Administered 2021-06-23 – 2021-07-02 (×3): 17 g via ORAL
  Filled 2021-06-19 (×3): qty 1

## 2021-06-19 MED ORDER — ONDANSETRON HCL 4 MG/2ML IJ SOLN: 4.0000 mg | Freq: Four times a day (QID) | INTRAMUSCULAR | Status: DC | PRN

## 2021-06-19 MED ORDER — SODIUM CHLORIDE 0.9 % IV BOLUS (SEPSIS)
1000.0000 mL | Freq: Once | INTRAVENOUS | Status: AC
  Administered 2021-06-19: 1000 mL via INTRAVENOUS

## 2021-06-19 MED ORDER — GABAPENTIN 600 MG PO TABS
600.0000 mg | ORAL_TABLET | Freq: Four times a day (QID) | ORAL | Status: DC
  Administered 2021-06-19 – 2021-07-02 (×48): 600 mg via ORAL
  Filled 2021-06-19 (×48): qty 1

## 2021-06-19 MED ORDER — HYDROCOD POLST-CPM POLST ER 10-8 MG/5ML PO SUER
5.0000 mL | Freq: Two times a day (BID) | ORAL | Status: DC | PRN
Start: 2021-06-19 — End: 2021-06-23

## 2021-06-19 MED ORDER — MELOXICAM 7.5 MG PO TABS
15.0000 mg | ORAL_TABLET | Freq: Every day | ORAL | Status: DC
  Administered 2021-06-20 – 2021-07-02 (×13): 15 mg via ORAL
  Filled 2021-06-19 (×13): qty 2

## 2021-06-19 MED ORDER — VANCOMYCIN HCL 1500 MG/300ML IV SOLN
1500.0000 mg | Freq: Once | INTRAVENOUS | Status: AC
  Administered 2021-06-19: 1500 mg via INTRAVENOUS
  Filled 2021-06-19: qty 300

## 2021-06-19 NOTE — ED Triage Notes (Signed)
Patient coming from home, daughter states she is altered from baseline. Patient denies dysuria, c/o shortness of breath. Patient just states she feels bad, unable to quantify beyond that.

## 2021-06-19 NOTE — ED Provider Notes (Addendum)
[facility] Emergency Department Provider Note  ____________________________________________   Event Date/Time   First MD Initiated Contact with Patient 06/19/21 1125     (approximate)  I have reviewed the triage vital signs and the nursing notes.   HISTORY  Chief Complaint Altered Mental Status    HPI Suzanne Dixon is a 70 y.o. female with past medical history as below here with altered mental status.  History is somewhat limited due to mild confusion.  Per report, patient states she just feels "bad."  She states that she has had generalized weakness and fatigue for the last several days.  She reports that she just feels "awful" and is unable to further provide much history.  Per EMS report, they were called to the scene by the daughter because the patient's been increasingly confused and weak throughout the day today.  She was found tachycardic and tachypneic.  She denies shortness of breath.  Remainder of history limited secondary to confusion.  Has not been eating, taking her medicine per daughter. Was significantly more confused today.   Past Medical History:  Diagnosis Date   A-fib ([place])    Anemia    B12 deficiency    Breast cancer ([place]) 03/31/2013   Right - chemo- mastectomy   Breast cancer ([place]) 1991   Rt.- radiation   Complication of anesthesia 01/2009   Recent years with general anesthesia had itching following surgery   DDD (degenerative disc disease), cervical    Depression    Diverticulitis    Dyspnea    Edema of both legs    GERD (gastroesophageal reflux disease)    Gout    H/O mastectomy 2014   per patient   H/O: cesarean section 1987   per patient report   H/O: hysterectomy 1996   per patient   Hydradenitis    Hypertension    Hypothyroidism    Neuropathy    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[place])    Parkinson's disease ([place])    Personal history of chemotherapy    Personal history of radiation therapy    Ruptured cervical disc 2002    per patient    Sleep apnea    cpap machine   Super obese    Tachycardia     Patient Active Problem List   Diagnosis Date Noted   COVID-19 virus infection 06/19/2021   CVA (cerebral vascular accident) ([place]) 02/01/2021   Cellulitis 02/01/2021   Hypothyroidism    Left leg cellulitis    Acquired thrombophilia ([place]) 12/10/2020   Status post reverse total shoulder replacement, 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   Lymphedema 07/17/2020   Right rotator cuff tear arthropathy 04/07/2020   Edema of both legs 12/31/2019   Polyneuropathy associated with underlying disease ([place]) 12/31/2019   Swelling of limb 12/31/2019   Hematuria, microscopic 06/07/2018   Age-related osteoporosis without current pathological fracture 12/08/2017   Rotator cuff arthropathy, right 01/03/2017   Biceps tendinitis of right upper extremity 12/05/2016   Complete tear of right rotator cuff 12/05/2016   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   History of breast cancer in female 06/02/2016   Allergic state 04/01/2015   Arthritis 04/01/2015   Cancer ([place]) 04/01/2015   Edema leg 04/01/2015   Gout 04/01/2015   Hidradenitis 04/01/2015   Elevated lymphocyte count 04/01/2015   Disorder of peripheral nervous system 04/01/2015   Apnea, sleep 04/01/2015   Avitaminosis D 04/01/2015   Breast cancer ([place]) 04/01/2015  Hypertension 04/15/2014   B12 deficiency 02/24/2014   AF (paroxysmal atrial fibrillation) ([place]) 02/17/2014   Adiposity 06/04/2012   Depression, major, recurrent, moderate ([place]) 03/26/2012    Past Surgical History:  Procedure Laterality Date   ABDOMINAL HYSTERECTOMY     BACK SURGERY  2002   plate and 2 screws in neck   BREAST BIOPSY Right 1991,2014   Positive   BREAST CYST ASPIRATION Left    BREAST SURGERY Right 2014   mastectomy   CATARACT EXTRACTION W/PHACO Left 05/17/2019   Procedure: CATARACT EXTRACTION PHACO AND INTRAOCULAR LENS PLACEMENT  ([place]);  Surgeon: Eulogio Bear, MD;  Location: [facility] ORS;  Service: Ophthalmology;  Laterality: Left;  [place] 00:28 CDE 2.15 FLUID PACK LOT # [id number]   CATARACT EXTRACTION W/PHACO Right 06/14/2019   Procedure: CATARACT EXTRACTION PHACO AND INTRAOCULAR LENS PLACEMENT (IOC) RIGHT;  Surgeon: Eulogio Bear, MD;  Location: [facility] ORS;  Service: Ophthalmology;  Laterality: Right;  [place] 00:35.0 CDE 2.37 Fluid Pack Lot #[id number]   CESAREAN SECTION     CHOLECYSTECTOMY  04/09/2012   COLONOSCOPY WITH PROPOFOL N/A 06/22/2015   Procedure: COLONOSCOPY WITH PROPOFOL;  Surgeon: Manya Silvas, MD;  Location: [facility] ENDOSCOPY;  Service: Endoscopy;  Laterality: N/A;   COLONOSCOPY WITH PROPOFOL N/A 05/25/2021   Procedure: COLONOSCOPY WITH PROPOFOL;  Surgeon: Lesly Rubenstein, MD;  Location: [facility] ENDOSCOPY;  Service: Endoscopy;  Laterality: N/A;   ESOPHAGOGASTRODUODENOSCOPY     ESOPHAGOGASTRODUODENOSCOPY N/A 05/25/2021   Procedure: ESOPHAGOGASTRODUODENOSCOPY (EGD);  Surgeon: Lesly Rubenstein, MD;  Location: [facility] ENDOSCOPY;  Service: Endoscopy;  Laterality: N/A;   EYE SURGERY     LAPAROSCOPIC GASTRIC BYPASS     MASTECTOMY Right    MASTECTOMY MODIFIED RADICAL     mastectomy partial      lumpectomy   NEUROPLASTY / TRANSPOSITION ULNAR NERVE AT ELBOW     OOPHORECTOMY     PORT-A-CATH REMOVAL Left 05/13/2015   Procedure: REMOVAL PORT-A-CATH LEFT CHEST ;  Surgeon: Florene Glen, MD;  Location: [facility] ORS;  Service: General;  Laterality: Left;   REDUCTION MAMMAPLASTY Left    REVERSE SHOULDER ARTHROPLASTY Right 12/03/2020   Procedure: REVERSE SHOULDER ARTHROPLASTY;  Surgeon: Corky Mull, MD;  Location: [facility] ORS;  Service: Orthopedics;  Laterality: Right;   ROUX-EN-Y GASTRIC BYPASS  01/26/2009   SHOULDER ARTHROSCOPY WITH OPEN ROTATOR CUFF REPAIR Right 01/03/2017   Procedure: SHOULDER ARTHROSCOPY WITH OPEN ROTATOR CUFF REPAIR;  Surgeon: Corky Mull, MD;  Location: [facility] ORS;  Service: Orthopedics;  Laterality:  Right;   SHOULDER ARTHROSCOPY WITH SUBACROMIAL DECOMPRESSION, ROTATOR CUFF REPAIR AND BICEP TENDON REPAIR Right 01/03/2017   Procedure: SHOULDER ARTHROSCOPY WITH SUBACROMIAL DECOMPRESSION, ROTATOR CUFF REPAIR AND BICEP TENDON REPAIR;  Surgeon: Corky Mull, MD;  Location: [facility] ORS;  Service: Orthopedics;  Laterality: Right;  Limited debridement   SPINE SURGERY      Prior to Admission medications   Medication Sig Start Date End Date Taking? Authorizing Provider  allopurinol (ZYLOPRIM) 300 MG tablet Take 300 mg by mouth daily.    Yes [provider]  Calcium Carb-Cholecalciferol 600-400 MG-UNIT TABS Take 1 tablet by mouth 2 (two) times daily with a meal.   Yes [provider]  carbidopa-levodopa (SINEMET IR) 25-100 MG tablet Take 1.5 tablets by mouth 3 (three) times daily. 06/25/20  Yes [provider]  DULoxetine (CYMBALTA) 60 MG capsule Take 1 capsule by mouth 2 (two) times daily. 02/23/21  Yes [provider]  furosemide (LASIX) 40 MG tablet Take 40  mg by mouth daily. 02/05/21  Yes [provider]  gabapentin (NEURONTIN) 600 MG tablet Take 600 mg by mouth 4 (four) times daily. 03/09/15  Yes [provider]  levothyroxine (SYNTHROID, LEVOTHROID) 50 MCG tablet Take 50 mcg by mouth daily before breakfast.   Yes [provider]  Magnesium 400 MG CAPS Take 400 mg by mouth daily.   Yes [provider]  meloxicam (MOBIC) 15 MG tablet Take 15 mg by mouth daily.   Yes [provider]  metoprolol (LOPRESSOR) 100 MG tablet Take 200 mg by mouth 2 (two) times daily.    Yes [provider]  Multiple Vitamins tablet Take 2 tablets by mouth daily.    Yes [provider]  omeprazole (PRILOSEC) 20 MG capsule Take 20 mg by mouth daily.    Yes [provider]  oxybutynin (DITROPAN) 5 MG tablet Take 5 mg by mouth 2 (two) times daily.   Yes [provider]  potassium chloride (KLOR-CON) 10 MEQ tablet Take  10 mEq by mouth 3 (three) times daily. 03/09/21  Yes [provider]  vitamin B-12 (CYANOCOBALAMIN) 1000 MCG tablet Take 1,000 mcg by mouth daily.   Yes [provider]  warfarin (COUMADIN) 1 MG tablet Take 1 mg by mouth at bedtime. (take with 6mg  tablet to equal 7mg  total)   Yes [provider]  warfarin (COUMADIN) 6 MG tablet Take 6 mg by mouth at bedtime. (take with 1mg  tablet to equal 7mg  total)   Yes [provider]  benzonatate (TESSALON) 100 MG capsule Take 100 mg by mouth 3 (three) times daily as needed for cough. 06/04/21   [provider]  fluticasone (FLONASE) 50 MCG/ACT nasal spray Place into both nostrils daily. Patient not taking: No sig reported    [provider]  Homeopathic Products (PSORIASIS/ECZEMA RELIEF EX) Apply 1 application topically daily as needed (eczema).    [provider]  HYDROcodone-acetaminophen (NORCO/VICODIN) 5-325 MG tablet Take 1-2 tablets by mouth every 4 (four) hours as needed for moderate pain (pain score 4-6). Patient not taking: No sig reported 12/04/20   Lattie Corns, PA-C  hydrocortisone 2.5 % cream Apply topically 2 (two) times daily.    [provider]  traMADol (ULTRAM) 50 MG tablet Take 1 tablet (50 mg total) by mouth every 6 (six) hours as needed for moderate pain. Patient not taking: No sig reported 12/04/20   Lattie Corns, PA-C  triamcinolone (KENALOG) 0.025 % cream Apply 1 application topically 2 (two) times daily. 11/06/20   [provider]    Allergies Advair diskus [fluticasone-salmeterol], Alendronate, Singulair [montelukast], Sulfa antibiotics, and Venlafaxine  Family History  Problem Relation Age of Onset   Hypertension Mother    Lymphoma Mother    Cancer Sister        Breast   Breast cancer Sister 47    Social History Social History   Tobacco Use   Smoking status: Never   Smokeless tobacco: Never  Substance Use Topics   Alcohol use:  No    Alcohol/week: 0.0 standard drinks   Drug use: No    Review of Systems  Review of Systems  Constitutional:  Positive for fatigue. Negative for fever.  HENT:  Negative for congestion and sore throat.   Eyes:  Negative for visual disturbance.  Respiratory:  Positive for shortness of breath. Negative for cough.   Cardiovascular:  Negative for chest pain.  Gastrointestinal:  Positive for nausea. Negative for abdominal pain, diarrhea  and vomiting.  Genitourinary:  Negative for flank pain.  Musculoskeletal:  Negative for back pain and neck pain.  Skin:  Negative for rash and wound.  Neurological:  Positive for weakness and light-headedness.  All other systems reviewed and are negative.   ____________________________________________  PHYSICAL EXAM:      VITAL SIGNS: ED Triage Vitals  Enc Vitals Group     BP 06/19/21 1119 (!) 144/97     Pulse Rate 06/19/21 1119 (!) 123     Resp 06/19/21 1119 20     Temp 06/19/21 1119 98.6 F (37 C)     Temp Source 06/19/21 1119 Oral     SpO2 06/19/21 1119 98 %     Weight --      Height --      Head Circumference --      Peak Flow --      Pain Score 06/19/21 1120 0     Pain Loc --      Pain Edu? --      Excl. in [place]? --      Physical Exam Vitals and nursing note reviewed.  Constitutional:      General: She is not in acute distress.    Appearance: She is well-developed.     Comments: Appears uncomfortable  HENT:     Head: Normocephalic and atraumatic.  Eyes:     Conjunctiva/sclera: Conjunctivae normal.  Cardiovascular:     Rate and Rhythm: Tachycardia present. Rhythm irregular.     Heart sounds: Normal heart sounds. No murmur heard.   No friction rub.  Pulmonary:     Effort: Pulmonary effort is normal. No respiratory distress.     Breath sounds: No wheezing or rales (bibasilar).  Abdominal:     General: There is no distension.     Palpations: Abdomen is soft.     Tenderness: There is no abdominal tenderness.   Musculoskeletal:     Cervical back: Neck supple.  Skin:    General: Skin is warm.     Capillary Refill: Capillary refill takes less than 2 seconds.  Neurological:     Mental Status: She is alert. She is confused.     Motor: No abnormal muscle tone.      ____________________________________________   LABS (all labs ordered are listed, but only abnormal results are displayed)  Labs Reviewed  RESP PANEL BY RT-PCR (FLU A&B, COVID) ARPGX2 - Abnormal; Notable for the following components:      Result Value   SARS Coronavirus 2 by RT PCR POSITIVE (*)    All other components within normal limits  COMPREHENSIVE METABOLIC PANEL - Abnormal; Notable for the following components:   Glucose, Bld 115 (*)    All other components within normal limits  LACTIC ACID, PLASMA - Abnormal; Notable for the following components:   Lactic Acid, Venous 3.2 (*)    All other components within normal limits  LACTIC ACID, PLASMA - Abnormal; Notable for the following components:   Lactic Acid, Venous 2.0 (*)    All other components within normal limits  URINALYSIS, ROUTINE W REFLEX MICROSCOPIC - Abnormal; Notable for the following components:   Color, Urine YELLOW (*)    APPearance HAZY (*)    Specific Gravity, Urine 1.038 (*)    pH 9.0 (*)    Protein, ur 30 (*)    Leukocytes,Ua LARGE (*)    All other components within normal limits  PROTIME-INR - Abnormal; Notable for the following components:   Prothrombin Time  17.2 (*)    INR 1.4 (*)    All other components within normal limits  D-DIMER, QUANTITATIVE - Abnormal; Notable for the following components:   D-Dimer, Quant 1.02 (*)    All other components within normal limits  FIBRINOGEN - Abnormal; Notable for the following components:   Fibrinogen 600 (*)    All other components within normal limits  TROPONIN I (HIGH SENSITIVITY) - Abnormal; Notable for the following components:   Troponin I (High Sensitivity) 100 (*)    All other components within  normal limits  TROPONIN I (HIGH SENSITIVITY) - Abnormal; Notable for the following components:   Troponin I (High Sensitivity) 93 (*)    All other components within normal limits  TROPONIN I (HIGH SENSITIVITY) - Abnormal; Notable for the following components:   Troponin I (High Sensitivity) 106 (*)    All other components within normal limits  CULTURE, BLOOD (SINGLE)  CULTURE, BLOOD (SINGLE)  URINE CULTURE  CBC WITH DIFFERENTIAL/PLATELET  LACTIC ACID, PLASMA  FERRITIN  C-REACTIVE PROTEIN  PROCALCITONIN  CBC WITH DIFFERENTIAL/PLATELET  COMPREHENSIVE METABOLIC PANEL  C-REACTIVE PROTEIN  D-DIMER, QUANTITATIVE  FERRITIN  MAGNESIUM  PHOSPHORUS  PROTIME-INR  TROPONIN I (HIGH SENSITIVITY)    ____________________________________________  EKG:  ________________________________________  RADIOLOGY All imaging, including plain films, CT scans, and ultrasounds, independently reviewed by me, and interpretations confirmed via formal radiology reads.  ED MD interpretation:   Chest x-ray: Bronchitic change CT angio chest: No PE, no pneumonia CT abdomen/pelvis: Bilateral renal cysts, postop changes, no obstruction, no acute abnormality CT head: Negative  Official radiology report(s): CT HEAD WO CONTRAST (5MM)  Result Date: 06/19/2021 CLINICAL DATA:  Mental status change EXAM: CT HEAD WITHOUT CONTRAST TECHNIQUE: Contiguous axial images were obtained from the base of the skull through the vertex without intravenous contrast. COMPARISON:  CT head dated Feb 01, 2021 FINDINGS: Brain: Mild chronic white matter ischemic change. No evidence of acute infarction, hemorrhage, hydrocephalus, extra-axial collection or mass lesion/mass effect. Vascular: No hyperdense vessel or unexpected calcification. Skull: Normal. Negative for fracture or focal lesion. Sinuses/Orbits: No acute finding. Other: None. IMPRESSION: No acute intracranial abnormality. Electronically Signed   By: Yetta Glassman M.D.   On:  06/19/2021 14:20   CT Angio Chest PE W and/or Wo Contrast  Result Date: 06/19/2021 CLINICAL DATA:  Nausea and vomiting. Altered from baseline. Feels bad. EXAM: CT ANGIOGRAPHY CHEST CT ABDOMEN AND PELVIS WITH CONTRAST TECHNIQUE: Multidetector CT imaging of the chest was performed using the standard protocol during bolus administration of intravenous contrast. Multiplanar CT image reconstructions and MIPs were obtained to evaluate the vascular anatomy. Multidetector CT imaging of the abdomen and pelvis was performed using the standard protocol during bolus administration of intravenous contrast. CONTRAST:  157mL OMNIPAQUE IOHEXOL 350 MG/ML SOLN COMPARISON:  06/11/2014 FINDINGS: CTA CHEST FINDINGS Cardiovascular: Heart size is normal. No pericardial effusion. Pulmonary arteries are well opacified. No acute pulmonary embolus. There is minimal atherosclerotic calcification of the thoracic aorta, not associated with aneurysm. Mediastinum/Nodes: The visualized portion of the thyroid gland has a normal appearance. No significant mediastinal, hilar, or axillary adenopathy. Esophagus is unremarkable. Lungs/Pleura: There is minimal subsegmental atelectasis in the RIGHT UPPER lobe. No pulmonary nodules, pleural effusions, or infiltrates. Airways are patent. Musculoskeletal: Significant degenerative changes throughout the thoracic spine. No suspicious lytic or blastic lesions. Review of the MIP images confirms the above findings. CT ABDOMEN and PELVIS FINDINGS Hepatobiliary: Within the posterior segment of the RIGHT hepatic lobe there is a 7 millimeter low-attenuation mass  which is not further characterized. Although not well seen on prior study, the small size limits adequate comparison. Gallbladder is absent., Pancreas: Unremarkable. No pancreatic ductal dilatation or surrounding inflammatory changes. Spleen: Normal in size without focal abnormality. Adrenals/Urinary Tract: Adrenal glands are normal. There are bilateral  renal cysts, largest on the RIGHT measuring 4.4 centimeters in the LOWER pole region. No hydronephrosis. Ureters are unremarkable. The bladder and visualized portion of the urethra are normal. Stomach/Bowel: Uncomplicated appearance of Roux-en-Y anastomosis. No small bowel dilatation. Small bowel suture lines are identified in the RIGHT mid abdomen and LEFT mid abdomen. Moderate stool burden. Scattered colonic diverticula without acute diverticulitis. The appendix is well seen and has a normal appearance. Vascular/Lymphatic: There is atherosclerotic calcification of the abdominal aorta not associated with aneurysm. No retroperitoneal or mesenteric adenopathy. Reproductive: Hysterectomy. Other: No ascites.  Prior RIGHT mastectomy. Musculoskeletal: Significant degenerative changes throughout the LOWER thoracic and lumbar spine. Review of the MIP images confirms the above findings. IMPRESSION: 1. Technically adequate exam showing no acute pulmonary embolus. 2.  Aortic atherosclerosis.  (ICD10-I70.0) 3. Cholecystectomy. 4. Bilateral renal cysts.  No acute urinary tract abnormality. 5. Postoperative changes in the small bowel. Uncomplicated Roux-en-Y anastomosis. 6. Prior hysterectomy. 7. Prior RIGHT mastectomy. Electronically Signed   By: Nolon Nations M.D.   On: 06/19/2021 14:36   CT ABDOMEN PELVIS W CONTRAST  Result Date: 06/19/2021 CLINICAL DATA:  Nausea and vomiting. Altered from baseline. Feels bad. EXAM: CT ANGIOGRAPHY CHEST CT ABDOMEN AND PELVIS WITH CONTRAST TECHNIQUE: Multidetector CT imaging of the chest was performed using the standard protocol during bolus administration of intravenous contrast. Multiplanar CT image reconstructions and MIPs were obtained to evaluate the vascular anatomy. Multidetector CT imaging of the abdomen and pelvis was performed using the standard protocol during bolus administration of intravenous contrast. CONTRAST:  170mL OMNIPAQUE IOHEXOL 350 MG/ML SOLN COMPARISON:   06/11/2014 FINDINGS: CTA CHEST FINDINGS Cardiovascular: Heart size is normal. No pericardial effusion. Pulmonary arteries are well opacified. No acute pulmonary embolus. There is minimal atherosclerotic calcification of the thoracic aorta, not associated with aneurysm. Mediastinum/Nodes: The visualized portion of the thyroid gland has a normal appearance. No significant mediastinal, hilar, or axillary adenopathy. Esophagus is unremarkable. Lungs/Pleura: There is minimal subsegmental atelectasis in the RIGHT UPPER lobe. No pulmonary nodules, pleural effusions, or infiltrates. Airways are patent. Musculoskeletal: Significant degenerative changes throughout the thoracic spine. No suspicious lytic or blastic lesions. Review of the MIP images confirms the above findings. CT ABDOMEN and PELVIS FINDINGS Hepatobiliary: Within the posterior segment of the RIGHT hepatic lobe there is a 7 millimeter low-attenuation mass which is not further characterized. Although not well seen on prior study, the small size limits adequate comparison. Gallbladder is absent., Pancreas: Unremarkable. No pancreatic ductal dilatation or surrounding inflammatory changes. Spleen: Normal in size without focal abnormality. Adrenals/Urinary Tract: Adrenal glands are normal. There are bilateral renal cysts, largest on the RIGHT measuring 4.4 centimeters in the LOWER pole region. No hydronephrosis. Ureters are unremarkable. The bladder and visualized portion of the urethra are normal. Stomach/Bowel: Uncomplicated appearance of Roux-en-Y anastomosis. No small bowel dilatation. Small bowel suture lines are identified in the RIGHT mid abdomen and LEFT mid abdomen. Moderate stool burden. Scattered colonic diverticula without acute diverticulitis. The appendix is well seen and has a normal appearance. Vascular/Lymphatic: There is atherosclerotic calcification of the abdominal aorta not associated with aneurysm. No retroperitoneal or mesenteric adenopathy.  Reproductive: Hysterectomy. Other: No ascites.  Prior RIGHT mastectomy. Musculoskeletal: Significant degenerative changes throughout the LOWER  thoracic and lumbar spine. Review of the MIP images confirms the above findings. IMPRESSION: 1. Technically adequate exam showing no acute pulmonary embolus. 2.  Aortic atherosclerosis.  (ICD10-I70.0) 3. Cholecystectomy. 4. Bilateral renal cysts.  No acute urinary tract abnormality. 5. Postoperative changes in the small bowel. Uncomplicated Roux-en-Y anastomosis. 6. Prior hysterectomy. 7. Prior RIGHT mastectomy. Electronically Signed   By: Nolon Nations M.D.   On: 06/19/2021 14:36   DG Chest Portable 1 View  Result Date: 06/19/2021 CLINICAL DATA:  Altered mental status. EXAM: PORTABLE CHEST 1 VIEW COMPARISON:  02/01/2021 FINDINGS: Heart size is normal. No focal consolidations or pleural effusions. Mild bronchial wall thickening, similar to prior. No pulmonary edema. Remote cervical spine fusion and RIGHT shoulder arthroplasty. Chronic changes in the LEFT shoulder. IMPRESSION: Bronchitic changes.  No evidence for acute  abnormality. Electronically Signed   By: Nolon Nations M.D.   On: 06/19/2021 12:27    ____________________________________________  PROCEDURES   Procedure(s) performed (including Critical Care):  .Critical Care Performed by: Duffy Bruce, MD Authorized by: Duffy Bruce, MD   Critical care provider statement:    Critical care time (minutes):  35   Critical care time was exclusive of:  Separately billable procedures and treating other patients and teaching time   Critical care was necessary to treat or prevent imminent or life-threatening deterioration of the following conditions:  Cardiac failure, circulatory failure and respiratory failure   Critical care was time spent personally by me on the following activities:  Development of treatment plan with patient or surrogate, discussions with consultants, evaluation of patient's  response to treatment, examination of patient, obtaining history from patient or surrogate, ordering and performing treatments and interventions, ordering and review of laboratory studies, ordering and review of radiographic studies, pulse oximetry, re-evaluation of patient's condition and review of old charts   I assumed direction of critical care for this patient from another provider in my specialty: no    ____________________________________________  INITIAL IMPRESSION / MDM / [place] / ED COURSE  As part of my medical decision making, I reviewed the following data within the [place] notes reviewed and incorporated, Old chart reviewed, Notes from prior ED visits, and [place] Controlled Substance Database       *Suzanne Dixon was evaluated in Emergency Department on 06/19/2021 for the symptoms described in the history of present illness. She was evaluated in the context of the global COVID-19 pandemic, which necessitated consideration that the patient might be at risk for infection with the SARS-CoV-2 virus that causes COVID-19. Institutional protocols and algorithms that pertain to the evaluation of patients at risk for COVID-19 are in a state of rapid change based on information released by regulatory bodies including the CDC and federal and state organizations. These policies and algorithms were followed during the patient's care in the ED.  Some ED evaluations and interventions may be delayed as a result of limited staffing during the pandemic.*     Medical Decision Making: 70 year old female here with chest pressure, shortness of breath, altered mental status in the setting of recent COVID diagnosis.  Suspect COVID-related encephalopathy and possible pneumonitis.  However, on arrival, patient noted to have significantly elevated troponin as well as lactic acid.  I suspect this is demand related, but given her chest pain, patient sent for CT angio which shows  no evidence of PE.  EKG shows normal sinus rhythm without evidence of ischemia or infarct.  No ST elevations.  She appears  mildly clinically dehydrated so fluids given.  Empiric antibiotics given for lactate though I suspect this is more so due to work of breathing and her COVID-19.  Will plan to admit for generalized weakness, possible demand ischemia in the setting of COVID-19 with encephalopathy.  She has no focal neurological deficits to suggest stroke at this time.  Restratification labs sent for COVID.   ____________________________________________  FINAL CLINICAL IMPRESSION(S) / ED DIAGNOSES  Final diagnoses:  COVID-19  Lactic acidosis  Elevated troponin     MEDICATIONS GIVEN DURING THIS VISIT:  Medications  allopurinol (ZYLOPRIM) tablet 300 mg (has no administration in time range)  meloxicam (MOBIC) tablet 15 mg (has no administration in time range)  furosemide (LASIX) tablet 40 mg (has no administration in time range)  metoprolol tartrate (LOPRESSOR) tablet 200 mg (has no administration in time range)  DULoxetine (CYMBALTA) DR capsule 60 mg (has no administration in time range)  levothyroxine (SYNTHROID) tablet 50 mcg (has no administration in time range)  pantoprazole (PROTONIX) EC tablet 40 mg (has no administration in time range)  oxybutynin (DITROPAN) tablet 5 mg (has no administration in time range)  vitamin B-12 (CYANOCOBALAMIN) tablet 1,000 mcg (has no administration in time range)  carbidopa-levodopa (SINEMET IR) 25-100 MG per tablet immediate release 1.5 tablet (has no administration in time range)  gabapentin (NEURONTIN) tablet 600 mg (has no administration in time range)  potassium chloride (KLOR-CON) CR tablet 10 mEq (has no administration in time range)  sodium chloride flush (NS) 0.9 % injection 3 mL (has no administration in time range)  remdesivir 200 mg in sodium chloride 0.9% 250 mL IVPB (has no administration in time range)    Followed by  remdesivir 100 mg  in sodium chloride 0.9 % 100 mL IVPB (has no administration in time range)  guaiFENesin-dextromethorphan (ROBITUSSIN DM) 100-10 MG/5ML syrup 10 mL (has no administration in time range)  chlorpheniramine-HYDROcodone (TUSSIONEX) 10-8 MG/5ML suspension 5 mL (has no administration in time range)  acetaminophen (TYLENOL) tablet 650 mg (has no administration in time range)  oxyCODONE (Oxy IR/ROXICODONE) immediate release tablet 5 mg (has no administration in time range)  traZODone (DESYREL) tablet 50 mg (has no administration in time range)  polyethylene glycol (MIRALAX / GLYCOLAX) packet 17 g (has no administration in time range)  ondansetron (ZOFRAN) tablet 4 mg (has no administration in time range)    Or  ondansetron (ZOFRAN) injection 4 mg (has no administration in time range)  aspirin EC tablet 325 mg (has no administration in time range)  warfarin (COUMADIN) tablet 10 mg (has no administration in time range)  Warfarin - Pharmacist Dosing Inpatient (has no administration in time range)  sodium chloride 0.9 % bolus 1,000 mL (0 mLs Intravenous Stopped 06/19/21 1455)  ceFEPIme (MAXIPIME) 2 g in sodium chloride 0.9 % 100 mL IVPB (0 g Intravenous Stopped 06/19/21 1416)  metroNIDAZOLE (FLAGYL) IVPB 500 mg (0 mg Intravenous Stopped 06/19/21 1452)  vancomycin (VANCOCIN) IVPB 1000 mg/200 mL premix (0 mg Intravenous Stopped 06/19/21 1545)    Followed by  vancomycin (VANCOREADY) IVPB 1500 mg/300 mL (0 mg Intravenous Stopped 06/19/21 1726)  iohexol (OMNIPAQUE) 350 MG/ML injection 100 mL (100 mLs Intravenous Contrast Given 06/19/21 1324)     ED Discharge Orders     None        Note:  This document was prepared using Dragon voice recognition software and may include unintentional dictation errors.   Duffy Bruce, MD 06/19/21 [facility] Monarch    Duffy Bruce, MD 06/19/21 2011

## 2021-06-19 NOTE — ED Notes (Signed)
Shuronia RN aware of assigned bed

## 2021-06-19 NOTE — ED Notes (Addendum)
Assigned bed now changed to rejected.

## 2021-06-19 NOTE — ED Notes (Signed)
Patient transported to CT 

## 2021-06-19 NOTE — Consult Note (Signed)
Remdesivir - Pharmacy Brief Note   O:  ALT: 17 CXR: Bronchitic changes.  No evidence for acute  abnormality. SpO2: 97% on RA   A/P:  Remdesivir 200 mg IVPB once followed by 100 mg IVPB daily x 4 days.   Darnelle Bos, PharmD 06/19/2021 7:03 PM

## 2021-06-19 NOTE — Consult Note (Addendum)
ANTICOAGULATION CONSULT NOTE - Initial Consult  Pharmacy Consult for warfarin with Lovenox bridging  Indication: atrial fibrillation  Allergies  Allergen Reactions   Advair Diskus [Fluticasone-Salmeterol] Other (See Comments)    Joint pain    Alendronate     Muscle pain   Singulair [Montelukast] Other (See Comments)    "muscle pain"   Sulfa Antibiotics Hives   Venlafaxine Other (See Comments)    "altered mental status/tremors"    Patient Measurements: Weight: 113.3 kg (249 lb 12.8 oz)  Vital Signs: Temp: 98.6 F (37 C) (10/01 1912) Temp Source: Oral (10/01 1912) BP: 174/99 (10/01 1912) Pulse Rate: 110 (10/01 1912)  Labs: Recent Labs    06/19/21 1145 06/19/21 1354 06/19/21 1730  HGB 14.2  --   --   HCT 41.1  --   --   PLT 324  --   --   LABPROT  --  17.2*  --   INR  --  1.4*  --   CREATININE 0.98  --   --   TROPONINIHS 100* 93* 106*    Estimated Creatinine Clearance: 64 mL/min (by C-G formula based on SCr of 0.98 mg/dL).   Medical History: Past Medical History:  Diagnosis Date   A-fib ([place])    Anemia    B12 deficiency    Breast cancer ([place]) 03/31/2013   Right - chemo- mastectomy   Breast cancer ([place]) 1991   Rt.- radiation   Complication of anesthesia 01/2009   Recent years with general anesthesia had itching following surgery   DDD (degenerative disc disease), cervical    Depression    Diverticulitis    Dyspnea    Edema of both legs    GERD (gastroesophageal reflux disease)    Gout    H/O mastectomy 2014   per patient   H/O: cesarean section 1987   per patient report   H/O: hysterectomy 1996   per patient   Hydradenitis    Hypertension    Hypothyroidism    Neuropathy    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 female with hx of A. fib, breast cancer, CVA, polyneuropathy, lymphocytic leukemia, presents with altered mental status and found to be COVID positive. Pharmacy has been consulted for warfarin dosing for A. Fib with Lovenox bridge for subtherapeutic INR on admission.  Baseline labs: INR 1.4; H/H and plts WNL; D-dimer 1.02  DDIs:  Warfarin + levothyroxine >> may increase INR Warfarin + meloxicam >> may enhance the anticoagulant effect of warafrin  Date Time INR Dose 9/30 2000 -- 7 mg  10/1 1354 1.4 10 mg  Goal of Therapy:  INR 2-3 Monitor platelets by anticoagulation protocol: Yes   Plan:  INR subtherapeutic on admission  Give warfarin 10 mg tonight Bridge with Lovenox 114 mg SQ q12h until INR is therapeutic  Monitor daily INR and CBC at least every 72 hours    Darnelle Bos, PharmD 06/19/2021,7:38 PM

## 2021-06-19 NOTE — Consult Note (Addendum)
CODE SEPSIS - PHARMACY COMMUNICATION  **Broad Spectrum Antibiotics should be administered within 1 hour of Sepsis diagnosis**  Time Code Sepsis Called/Page Received: 1236  Antibiotics Ordered: 8241  Time of 1st antibiotic administration: Thor, PharmD Pharmacy Resident  06/19/2021 1:07 PM

## 2021-06-19 NOTE — H&P (Signed)
[facility] History and Physical  Suzanne Dixon FUX:[id number] DOB: 31-Jan-1951 DOA: 06/19/2021  Referring physician: Dr. Ellender Hose PCP: Adin Hector, MD   Chief Complaint: altered mental status  HPI: Suzanne Dixon is a 70 y.o. female with hx of A. fib, breast cancer, CVA, polyneuropathy, lymphocytic leukemia, presents with altered mental status.  Patient reports several days of feeling unwell.  She endorses having a cough but denies any shortness of breath or chest pain.  She reports she had COVID 2 months prior.  She endorses body aches and chills.  Denies any other symptoms.  She is unsure where she is.  In the ED initial vital signs notable for tachycardia in the 120s that improved to the low 100s, tachypnea volitionally in the 30s that improved to the normal range, and mild hypertension with normal O2 sats on room air.  Initial CMP and CBC unremarkable.  Troponin was 100 and lactic acid 3.2 on initial check, but down trended to 93 and 2.0 respectively.  COVID test returned positive.  UA showed extremely concentrated urine and elevated pH.  Blood cultures were obtained.  CT chest abdomen and pelvis was obtained which showed no PE in the chest and no acute findings in the abdomen.  CT head without contrast was also unremarkable.  She was given a 1 L normal saline bolus, started on broad-spectrum antibiotics, and admitted for further management.  Review of Systems:  Pertinent positives and negative per HPI, all others reviewed and negative  Past Medical History:  Diagnosis Date   A-fib ([place])    Anemia    B12 deficiency    Breast cancer ([place]) 03/31/2013   Right - chemo- mastectomy   Breast cancer ([place]) 1991   Rt.- radiation   Complication of anesthesia 01/2009   Recent years with general anesthesia had itching following surgery   DDD (degenerative disc disease), cervical    Depression    Diverticulitis    Dyspnea    Edema of both legs    GERD (gastroesophageal reflux  disease)    Gout    H/O mastectomy 2014   per patient   H/O: cesarean section 1987   per patient report   H/O: hysterectomy 1996   per patient   Hydradenitis    Hypertension    Hypothyroidism    Neuropathy    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[place])    Parkinson's disease ([place])    Personal history of chemotherapy    Personal history of radiation therapy    Ruptured cervical disc 2002   per patient    Sleep apnea    cpap machine   Super obese    Tachycardia    Past Surgical History:  Procedure Laterality Date   ABDOMINAL HYSTERECTOMY     BACK SURGERY  2002   plate and 2 screws in neck   BREAST BIOPSY Right 1991,2014   Positive   BREAST CYST ASPIRATION Left    BREAST SURGERY Right 2014   mastectomy   CATARACT EXTRACTION W/PHACO Left 05/17/2019   Procedure: CATARACT EXTRACTION PHACO AND INTRAOCULAR LENS PLACEMENT ([place]);  Surgeon: Eulogio Bear, MD;  Location: [facility] ORS;  Service: Ophthalmology;  Laterality: Left;  [place] 00:28 CDE 2.15 FLUID PACK LOT # [id number]   CATARACT EXTRACTION W/PHACO Right 06/14/2019   Procedure: CATARACT EXTRACTION PHACO AND INTRAOCULAR LENS PLACEMENT ([facility]) RIGHT;  Surgeon: Eulogio Bear, MD;  Location: [facility] ORS;  Service: Ophthalmology;  Laterality: Right;  [place] 00:35.0 CDE 2.37 Fluid Pack  Lot #[id number]   CESAREAN SECTION     CHOLECYSTECTOMY  04/09/2012   COLONOSCOPY WITH PROPOFOL N/A 06/22/2015   Procedure: COLONOSCOPY WITH PROPOFOL;  Surgeon: Manya Silvas, MD;  Location: [facility] ENDOSCOPY;  Service: Endoscopy;  Laterality: N/A;   COLONOSCOPY WITH PROPOFOL N/A 05/25/2021   Procedure: COLONOSCOPY WITH PROPOFOL;  Surgeon: Lesly Rubenstein, MD;  Location: [facility] ENDOSCOPY;  Service: Endoscopy;  Laterality: N/A;   ESOPHAGOGASTRODUODENOSCOPY     ESOPHAGOGASTRODUODENOSCOPY N/A 05/25/2021   Procedure: ESOPHAGOGASTRODUODENOSCOPY (EGD);  Surgeon: Lesly Rubenstein, MD;  Location: [facility] ENDOSCOPY;  Service: Endoscopy;  Laterality: N/A;   EYE SURGERY      LAPAROSCOPIC GASTRIC BYPASS     MASTECTOMY Right    MASTECTOMY MODIFIED RADICAL     mastectomy partial      lumpectomy   NEUROPLASTY / TRANSPOSITION ULNAR NERVE AT ELBOW     OOPHORECTOMY     PORT-A-CATH REMOVAL Left 05/13/2015   Procedure: REMOVAL PORT-A-CATH LEFT CHEST ;  Surgeon: Florene Glen, MD;  Location: [facility] ORS;  Service: General;  Laterality: Left;   REDUCTION MAMMAPLASTY Left    REVERSE SHOULDER ARTHROPLASTY Right 12/03/2020   Procedure: REVERSE SHOULDER ARTHROPLASTY;  Surgeon: Corky Mull, MD;  Location: [facility] ORS;  Service: Orthopedics;  Laterality: Right;   ROUX-EN-Y GASTRIC BYPASS  01/26/2009   SHOULDER ARTHROSCOPY WITH OPEN ROTATOR CUFF REPAIR Right 01/03/2017   Procedure: SHOULDER ARTHROSCOPY WITH OPEN ROTATOR CUFF REPAIR;  Surgeon: Corky Mull, MD;  Location: [facility] ORS;  Service: Orthopedics;  Laterality: Right;   SHOULDER ARTHROSCOPY WITH SUBACROMIAL DECOMPRESSION, ROTATOR CUFF REPAIR AND BICEP TENDON REPAIR Right 01/03/2017   Procedure: SHOULDER ARTHROSCOPY WITH SUBACROMIAL DECOMPRESSION, ROTATOR CUFF REPAIR AND BICEP TENDON REPAIR;  Surgeon: Corky Mull, MD;  Location: [facility] ORS;  Service: Orthopedics;  Laterality: Right;  Limited debridement   SPINE SURGERY     Social History:  reports that she has never smoked. She has never used smokeless tobacco. She reports that she does not drink alcohol and does not use drugs.  Allergies  Allergen Reactions   Advair Diskus [Fluticasone-Salmeterol] Other (See Comments)    Joint pain    Alendronate     Muscle pain   Singulair [Montelukast] Other (See Comments)    "muscle pain"   Sulfa Antibiotics Hives   Venlafaxine Other (See Comments)    "altered mental status/tremors"    Family History  Problem Relation Age of Onset   Hypertension Mother    Lymphoma Mother    Cancer Sister        Breast   Breast cancer Sister 62     Prior to Admission medications   Medication Sig Start Date End Date Taking? Authorizing Provider   allopurinol (ZYLOPRIM) 300 MG tablet Take 300 mg by mouth daily.     [provider]  Calcium Carb-Cholecalciferol 600-400 MG-UNIT TABS Take 1 tablet by mouth 2 (two) times daily with a meal.    [provider]  carbidopa-levodopa (SINEMET IR) 25-100 MG tablet Take 1.5 tablets by mouth 3 (three) times daily. 06/25/20   [provider]  DULoxetine (CYMBALTA) 60 MG capsule Take 1 capsule by mouth 2 (two) times daily. 02/23/21   [provider]  fluticasone (FLONASE) 50 MCG/ACT nasal spray Place into both nostrils daily. Patient not taking: Reported on 05/25/2021    [provider]  furosemide (LASIX) 40 MG tablet Take 40 mg by mouth daily. 02/05/21   [provider]  gabapentin (NEURONTIN) 600 MG tablet Take  600 mg by mouth 4 (four) times daily. 03/09/15   [provider]  Homeopathic Products (PSORIASIS/ECZEMA RELIEF EX) Apply 1 application topically daily as needed (eczema).    [provider]  HYDROcodone-acetaminophen (NORCO/VICODIN) 5-325 MG tablet Take 1-2 tablets by mouth every 4 (four) hours as needed for moderate pain (pain score 4-6). Patient not taking: Reported on 06/19/2021 12/04/20   Lattie Corns, PA-C  hydrocortisone 2.5 % cream Apply topically 2 (two) times daily.    [provider]  levothyroxine (SYNTHROID, LEVOTHROID) 50 MCG tablet Take 50 mcg by mouth daily before breakfast.    [provider]  Magnesium 400 MG CAPS Take 400 mg by mouth daily.    [provider]  meloxicam (MOBIC) 15 MG tablet Take 15 mg by mouth daily.    [provider]  metoprolol (LOPRESSOR) 100 MG tablet Take 200 mg by mouth 2 (two) times daily.     [provider]  Multiple Vitamins tablet Take 2 tablets by mouth daily.     [provider]  omeprazole (PRILOSEC) 20 MG capsule Take 20 mg by mouth daily.     [provider]  oxybutynin (DITROPAN) 5 MG tablet Take 5 mg by mouth  2 (two) times daily.    [provider]  potassium chloride (KLOR-CON) 10 MEQ tablet Take 10 mEq by mouth 3 (three) times daily. 03/09/21   [provider]  traMADol (ULTRAM) 50 MG tablet Take 1 tablet (50 mg total) by mouth every 6 (six) hours as needed for moderate pain. Patient not taking: Reported on 06/19/2021 12/04/20   Lattie Corns, PA-C  triamcinolone (KENALOG) 0.025 % cream Apply 1 application topically 2 (two) times daily. 11/06/20   [provider]  vitamin B-12 (CYANOCOBALAMIN) 1000 MCG tablet Take 1,000 mcg by mouth daily.    [provider]  warfarin (COUMADIN) 1 MG tablet Take 1 mg by mouth at bedtime. (take with 6mg  tablet to equal 7mg  total)    [provider]  warfarin (COUMADIN) 6 MG tablet Take 6 mg by mouth at bedtime. (take with 1mg  tablet to equal 7mg  total)    [provider]   Physical Exam: Vitals:   06/19/21 1439 06/19/21 1457 06/19/21 1532 06/19/21 1658  BP: (!) 152/94 (!) 139/108 (!) 145/105 (!) 143/91  Pulse: (!) 102 (!) 102 (!) 106 (!) 101  Resp: (!) 23 (!) 22 16 20   Temp:      TempSrc:      SpO2: 94% 99% 97% 95%  Weight:        Wt Readings from Last 3 Encounters:  06/19/21 113.3 kg  05/25/21 122.5 kg  04/19/21 122.5 kg     General:  Appears mildly uncomfortable and anxious Eyes: PERRL, normal lids, irises & conjunctiva ENT: grossly normal hearing, lips & tongue Neck: no masses Cardiovascular: RRR, no m/r/g. No LE edema. Respiratory: CTA bilaterally, no w/r/r. Normal respiratory effort. Abdomen: soft, ntnd Skin: no rash or induration seen on limited exam Musculoskeletal: grossly normal tone BUE/BLE Psychiatric: anxious affect. Oriented to self but unable to give hospital as location or the year. Unable to spell WORLD backwards.  Neurologic: grossly non-focal.          Labs on Admission:  Basic Metabolic Panel: Recent Labs  Lab 06/19/21 1145  NA 141  K 3.9  CL 101  CO2 26  GLUCOSE  115*  BUN 11  CREATININE 0.98  CALCIUM 9.0   Liver Function Tests: Recent  Labs  Lab 06/19/21 1145  AST 37  ALT 17  ALKPHOS 68  BILITOT 1.2  PROT 7.1  ALBUMIN 3.5   No results for input(s): LIPASE, AMYLASE in the last 168 hours. No results for input(s): AMMONIA in the last 168 hours. CBC: Recent Labs  Lab 06/19/21 1145  WBC 8.5  NEUTROABS 4.2  HGB 14.2  HCT 41.1  MCV 91.3  PLT 324   Cardiac Enzymes: No results for input(s): CKTOTAL, CKMB, CKMBINDEX, TROPONINI in the last 168 hours.  BNP (last 3 results) Recent Labs    01/19/21 1321  BNP 164.8*    ProBNP (last 3 results) No results for input(s): PROBNP in the last 8760 hours.  CBG: No results for input(s): GLUCAP in the last 168 hours.  Radiological Exams on Admission: CT HEAD WO CONTRAST (5MM)  Result Date: 06/19/2021 CLINICAL DATA:  Mental status change EXAM: CT HEAD WITHOUT CONTRAST TECHNIQUE: Contiguous axial images were obtained from the base of the skull through the vertex without intravenous contrast. COMPARISON:  CT head dated Feb 01, 2021 FINDINGS: Brain: Mild chronic white matter ischemic change. No evidence of acute infarction, hemorrhage, hydrocephalus, extra-axial collection or mass lesion/mass effect. Vascular: No hyperdense vessel or unexpected calcification. Skull: Normal. Negative for fracture or focal lesion. Sinuses/Orbits: No acute finding. Other: None. IMPRESSION: No acute intracranial abnormality. Electronically Signed   By: Yetta Glassman M.D.   On: 06/19/2021 14:20   CT Angio Chest PE W and/or Wo Contrast  Result Date: 06/19/2021 CLINICAL DATA:  Nausea and vomiting. Altered from baseline. Feels bad. EXAM: CT ANGIOGRAPHY CHEST CT ABDOMEN AND PELVIS WITH CONTRAST TECHNIQUE: Multidetector CT imaging of the chest was performed using the standard protocol during bolus administration of intravenous contrast. Multiplanar CT image reconstructions and MIPs were obtained to evaluate the vascular  anatomy. Multidetector CT imaging of the abdomen and pelvis was performed using the standard protocol during bolus administration of intravenous contrast. CONTRAST:  135mL OMNIPAQUE IOHEXOL 350 MG/ML SOLN COMPARISON:  06/11/2014 FINDINGS: CTA CHEST FINDINGS Cardiovascular: Heart size is normal. No pericardial effusion. Pulmonary arteries are well opacified. No acute pulmonary embolus. There is minimal atherosclerotic calcification of the thoracic aorta, not associated with aneurysm. Mediastinum/Nodes: The visualized portion of the thyroid gland has a normal appearance. No significant mediastinal, hilar, or axillary adenopathy. Esophagus is unremarkable. Lungs/Pleura: There is minimal subsegmental atelectasis in the RIGHT UPPER lobe. No pulmonary nodules, pleural effusions, or infiltrates. Airways are patent. Musculoskeletal: Significant degenerative changes throughout the thoracic spine. No suspicious lytic or blastic lesions. Review of the MIP images confirms the above findings. CT ABDOMEN and PELVIS FINDINGS Hepatobiliary: Within the posterior segment of the RIGHT hepatic lobe there is a 7 millimeter low-attenuation mass which is not further characterized. Although not well seen on prior study, the small size limits adequate comparison. Gallbladder is absent., Pancreas: Unremarkable. No pancreatic ductal dilatation or surrounding inflammatory changes. Spleen: Normal in size without focal abnormality. Adrenals/Urinary Tract: Adrenal glands are normal. There are bilateral renal cysts, largest on the RIGHT measuring 4.4 centimeters in the LOWER pole region. No hydronephrosis. Ureters are unremarkable. The bladder and visualized portion of the urethra are normal. Stomach/Bowel: Uncomplicated appearance of Roux-en-Y anastomosis. No small bowel dilatation. Small bowel suture lines are identified in the RIGHT mid abdomen and LEFT mid abdomen. Moderate stool burden. Scattered colonic diverticula without acute  diverticulitis. The appendix is well seen and has a normal appearance. Vascular/Lymphatic: There is atherosclerotic calcification of the abdominal aorta not associated with aneurysm. No  retroperitoneal or mesenteric adenopathy. Reproductive: Hysterectomy. Other: No ascites.  Prior RIGHT mastectomy. Musculoskeletal: Significant degenerative changes throughout the LOWER thoracic and lumbar spine. Review of the MIP images confirms the above findings. IMPRESSION: 1. Technically adequate exam showing no acute pulmonary embolus. 2.  Aortic atherosclerosis.  (ICD10-I70.0) 3. Cholecystectomy. 4. Bilateral renal cysts.  No acute urinary tract abnormality. 5. Postoperative changes in the small bowel. Uncomplicated Roux-en-Y anastomosis. 6. Prior hysterectomy. 7. Prior RIGHT mastectomy. Electronically Signed   By: Nolon Nations M.D.   On: 06/19/2021 14:36   CT ABDOMEN PELVIS W CONTRAST  Result Date: 06/19/2021 CLINICAL DATA:  Nausea and vomiting. Altered from baseline. Feels bad. EXAM: CT ANGIOGRAPHY CHEST CT ABDOMEN AND PELVIS WITH CONTRAST TECHNIQUE: Multidetector CT imaging of the chest was performed using the standard protocol during bolus administration of intravenous contrast. Multiplanar CT image reconstructions and MIPs were obtained to evaluate the vascular anatomy. Multidetector CT imaging of the abdomen and pelvis was performed using the standard protocol during bolus administration of intravenous contrast. CONTRAST:  145mL OMNIPAQUE IOHEXOL 350 MG/ML SOLN COMPARISON:  06/11/2014 FINDINGS: CTA CHEST FINDINGS Cardiovascular: Heart size is normal. No pericardial effusion. Pulmonary arteries are well opacified. No acute pulmonary embolus. There is minimal atherosclerotic calcification of the thoracic aorta, not associated with aneurysm. Mediastinum/Nodes: The visualized portion of the thyroid gland has a normal appearance. No significant mediastinal, hilar, or axillary adenopathy. Esophagus is unremarkable.  Lungs/Pleura: There is minimal subsegmental atelectasis in the RIGHT UPPER lobe. No pulmonary nodules, pleural effusions, or infiltrates. Airways are patent. Musculoskeletal: Significant degenerative changes throughout the thoracic spine. No suspicious lytic or blastic lesions. Review of the MIP images confirms the above findings. CT ABDOMEN and PELVIS FINDINGS Hepatobiliary: Within the posterior segment of the RIGHT hepatic lobe there is a 7 millimeter low-attenuation mass which is not further characterized. Although not well seen on prior study, the small size limits adequate comparison. Gallbladder is absent., Pancreas: Unremarkable. No pancreatic ductal dilatation or surrounding inflammatory changes. Spleen: Normal in size without focal abnormality. Adrenals/Urinary Tract: Adrenal glands are normal. There are bilateral renal cysts, largest on the RIGHT measuring 4.4 centimeters in the LOWER pole region. No hydronephrosis. Ureters are unremarkable. The bladder and visualized portion of the urethra are normal. Stomach/Bowel: Uncomplicated appearance of Roux-en-Y anastomosis. No small bowel dilatation. Small bowel suture lines are identified in the RIGHT mid abdomen and LEFT mid abdomen. Moderate stool burden. Scattered colonic diverticula without acute diverticulitis. The appendix is well seen and has a normal appearance. Vascular/Lymphatic: There is atherosclerotic calcification of the abdominal aorta not associated with aneurysm. No retroperitoneal or mesenteric adenopathy. Reproductive: Hysterectomy. Other: No ascites.  Prior RIGHT mastectomy. Musculoskeletal: Significant degenerative changes throughout the LOWER thoracic and lumbar spine. Review of the MIP images confirms the above findings. IMPRESSION: 1. Technically adequate exam showing no acute pulmonary embolus. 2.  Aortic atherosclerosis.  (ICD10-I70.0) 3. Cholecystectomy. 4. Bilateral renal cysts.  No acute urinary tract abnormality. 5. Postoperative  changes in the small bowel. Uncomplicated Roux-en-Y anastomosis. 6. Prior hysterectomy. 7. Prior RIGHT mastectomy. Electronically Signed   By: Nolon Nations M.D.   On: 06/19/2021 14:36   DG Chest Portable 1 View  Result Date: 06/19/2021 CLINICAL DATA:  Altered mental status. EXAM: PORTABLE CHEST 1 VIEW COMPARISON:  02/01/2021 FINDINGS: Heart size is normal. No focal consolidations or pleural effusions. Mild bronchial wall thickening, similar to prior. No pulmonary edema. Remote cervical spine fusion and RIGHT shoulder arthroplasty. Chronic changes in the LEFT shoulder. IMPRESSION: Bronchitic changes.  No evidence for acute  abnormality. Electronically Signed   By: Nolon Nations M.D.   On: 06/19/2021 12:27    EKG: Pending  Assessment/Plan Active Problems:   COVID-19 virus infection   VONA WHITERS is a 70 y.o. female with hx of A. fib, breast cancer, CVA, polyneuropathy, lymphocytic leukemia, presents with altered mental status in setting of new COVID infection.  #Altered mental status #Delirium #Acute metabolic encephalopathy #ATFTD-32 infection #Lactic acidosis Patient presenting with altered mental status consistent with delirium in setting of COVID-19 infection and signs of dehydration.  Lactic acidosis has resolved with fluid resuscitation.  We will treat per usual protocol and monitor mental status. - Discontinue broad-spectrum antibiotics - Remdesivir per pharmacy protocol - Routine COVID daily labs - Not hypoxic, defer steroids at this time  #Elevated troponin Unclear etiology, may be related to her COVID infection.  Patient is asymptomatic without chest pain or shortness of breath.  On serial troponins level has remained stable at around 100. - Follow-up EKG - Obtain echocardiogram - Continue to trend troponin  #Chronic medical problems Gout-continue allopurinol  Parkinson's-continue Sinemet  Neuropathy-continue Cymbalta, gabapentin  Lower extremity  edema-continue Lasix  Hypothyroidism-continue Synthroid  A. fib-continue metoprolol, warfarin.  Subtherapeutic INR on admission, follow-up level tomorrow and adjust accordingly  GERD-continue PPI  OAB-continue oxybutynin  Code Status: Full code, presumed DVT Prophylaxis: On warfarin Family Communication: None Disposition Plan: Inpatient, MedSurg  Time spent: 25 min  Clarnce Flock MD/[facility]  Note:  This document was prepared using Systems analyst and may include unintentional dictation errors.

## 2021-06-19 NOTE — ED Notes (Signed)
Patient returns from ct scan

## 2021-06-19 NOTE — Progress Notes (Signed)
Elink monitoring for sepsis protocol 

## 2021-06-19 NOTE — ED Notes (Signed)
Secure chat sent to MD regarding critical troponin 106.

## 2021-06-20 ENCOUNTER — Encounter: Payer: Self-pay | Admitting: Family Medicine

## 2021-06-20 DIAGNOSIS — C50411 Malignant neoplasm of upper-outer quadrant of right female breast: Secondary | ICD-10-CM

## 2021-06-20 DIAGNOSIS — F331 Major depressive disorder, recurrent, moderate: Secondary | ICD-10-CM

## 2021-06-20 DIAGNOSIS — U071 COVID-19: Secondary | ICD-10-CM | POA: Diagnosis not present

## 2021-06-20 DIAGNOSIS — Z171 Estrogen receptor negative status [ER-]: Secondary | ICD-10-CM

## 2021-06-20 DIAGNOSIS — R000 Tachycardia, unspecified: Secondary | ICD-10-CM

## 2021-06-20 DIAGNOSIS — I639 Cerebral infarction, unspecified: Secondary | ICD-10-CM

## 2021-06-20 DIAGNOSIS — I480 Paroxysmal atrial fibrillation: Secondary | ICD-10-CM | POA: Diagnosis not present

## 2021-06-20 DIAGNOSIS — E538 Deficiency of other specified B group vitamins: Secondary | ICD-10-CM

## 2021-06-20 LAB — COMPREHENSIVE METABOLIC PANEL WITH GFR
ALT: <5 U/L (ref 0–44)
AST: 24 U/L (ref 15–41)
Albumin: 2.9 g/dL — ABNORMAL LOW (ref 3.5–5.0)
Alkaline Phosphatase: 60 U/L (ref 38–126)
Anion gap: 6 (ref 5–15)
BUN: 9 mg/dL (ref 8–23)
CO2: 28 mmol/L (ref 22–32)
Calcium: 8.3 mg/dL — ABNORMAL LOW (ref 8.9–10.3)
Chloride: 106 mmol/L (ref 98–111)
Creatinine, Ser: 0.75 mg/dL (ref 0.44–1.00)
GFR, Estimated: >60 mL/min
Glucose, Bld: 93 mg/dL (ref 70–99)
Potassium: 3.5 mmol/L (ref 3.5–5.1)
Sodium: 140 mmol/L (ref 135–145)
Total Bilirubin: 0.9 mg/dL (ref 0.3–1.2)
Total Protein: 6 g/dL — ABNORMAL LOW (ref 6.5–8.1)

## 2021-06-20 LAB — URINALYSIS, MICROSCOPIC (REFLEX)
Bacteria, UA: NONE SEEN
WBC, UA: >50 WBC/hpf (ref 0–5)

## 2021-06-20 LAB — CBC WITH DIFFERENTIAL/PLATELET
Abs Immature Granulocytes: 0.02 10*3/uL (ref 0.00–0.07)
Basophils Absolute: 0.1 10*3/uL (ref 0.0–0.1)
Basophils Relative: 1 %
Eosinophils Absolute: 0 10*3/uL (ref 0.0–0.5)
Eosinophils Relative: 1 %
HCT: 36.7 % (ref 36.0–46.0)
Hemoglobin: 12.4 g/dL (ref 12.0–15.0)
Immature Granulocytes: 0 %
Lymphocytes Relative: 43 %
Lymphs Abs: 3.2 10*3/uL (ref 0.7–4.0)
MCH: 31.2 pg (ref 26.0–34.0)
MCHC: 33.8 g/dL (ref 30.0–36.0)
MCV: 92.2 fL (ref 80.0–100.0)
Monocytes Absolute: 0.9 10*3/uL (ref 0.1–1.0)
Monocytes Relative: 13 %
Neutro Abs: 3.1 10*3/uL (ref 1.7–7.7)
Neutrophils Relative %: 42 %
Platelets: 277 10*3/uL (ref 150–400)
RBC: 3.98 MIL/uL (ref 3.87–5.11)
RDW: 14.4 % (ref 11.5–15.5)
WBC: 7.3 10*3/uL (ref 4.0–10.5)
nRBC: 0 % (ref 0.0–0.2)

## 2021-06-20 LAB — PROTIME-INR
INR: 1.4 — ABNORMAL HIGH (ref 0.8–1.2)
Prothrombin Time: 16.7 seconds — ABNORMAL HIGH (ref 11.4–15.2)

## 2021-06-20 LAB — C-REACTIVE PROTEIN
CRP: 0.6 mg/dL
CRP: 0.6 mg/dL (ref <1.0)

## 2021-06-20 LAB — D-DIMER, QUANTITATIVE: D-Dimer, Quant: 0.9 ug{FEU}/mL — ABNORMAL HIGH (ref 0.00–0.50)

## 2021-06-20 LAB — PHOSPHORUS: Phosphorus: 3.9 mg/dL (ref 2.5–4.6)

## 2021-06-20 LAB — FERRITIN: Ferritin: 115 ng/mL (ref 11–307)

## 2021-06-20 LAB — MAGNESIUM: Magnesium: 1.6 mg/dL — ABNORMAL LOW (ref 1.7–2.4)

## 2021-06-20 MED ORDER — WARFARIN SODIUM 7.5 MG PO TABS
7.5000 mg | ORAL_TABLET | Freq: Once | ORAL | Status: AC
  Administered 2021-06-20: 7.5 mg via ORAL
  Filled 2021-06-20: qty 1

## 2021-06-20 MED ORDER — MAGNESIUM SULFATE 2 GM/50ML IV SOLN
2.0000 g | Freq: Once | INTRAVENOUS | Status: AC
  Administered 2021-06-20: 2 g via INTRAVENOUS
  Filled 2021-06-20: qty 50

## 2021-06-20 NOTE — Consult Note (Signed)
[facility] for warfarin with Lovenox bridging  Indication: atrial fibrillation  Allergies  Allergen Reactions   Advair Diskus [Fluticasone-Salmeterol] Other (See Comments)    Joint pain    Alendronate     Muscle pain   Singulair [Montelukast] Other (See Comments)    "muscle pain"   Sulfa Antibiotics Hives   Venlafaxine Other (See Comments)    "altered mental status/tremors"    Patient Measurements: Height: 5\' 4"  (162.6 cm) Weight: 113.3 kg (249 lb 12.8 oz) IBW/kg (Calculated) : 54.7  Vital Signs: Temp: 97.7 F (36.5 C) (10/02 0532) Temp Source: Oral (10/02 0532) BP: 139/86 (10/02 0532) Pulse Rate: 75 (10/02 0532)  Labs: Recent Labs    06/19/21 1145 06/19/21 1354 06/19/21 1730 06/19/21 1943 06/20/21 0446  HGB 14.2  --   --   --  12.4  HCT 41.1  --   --   --  36.7  PLT 324  --   --   --  277  LABPROT  --  17.2*  --   --  16.7*  INR  --  1.4*  --   --  1.4*  CREATININE 0.98  --   --   --  0.75  TROPONINIHS 100* 93* 106* 119*  --      Estimated Creatinine Clearance: 78.4 mL/min (by C-G formula based on SCr of 0.75 mg/dL).   Medical History: Past Medical History:  Diagnosis Date   A-fib ([place])    Anemia    B12 deficiency    Breast cancer ([place]) 03/31/2013   Right - chemo- mastectomy   Breast cancer ([place]) 1991   Rt.- radiation   Complication of anesthesia 01/2009   Recent years with general anesthesia had itching following surgery   DDD (degenerative disc disease), cervical    Depression    Diverticulitis    Dyspnea    Edema of both legs    GERD (gastroesophageal reflux disease)    Gout    H/O mastectomy 2014   per patient   H/O: cesarean section 1987   per patient report   H/O: hysterectomy 1996   per patient   Hydradenitis    Hypertension    Hypothyroidism    Neuropathy    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)    Personal history of chemotherapy    Personal history of radiation  therapy    Ruptured cervical disc 2002   per patient    Sleep apnea    cpap machine   Super obese    Tachycardia     Medications:  Warfarin 7 mg daily (TWD: 49 mg)  Last dose: 9/30 @ 2000  Assessment: 70 y.o. female with hx of A. fib, breast cancer, CVA, polyneuropathy, lymphocytic leukemia, presents with altered mental status and found to be COVID positive. Pharmacy has been consulted for warfarin dosing for A. Fib with Lovenox bridge for subtherapeutic INR on admission.  Baseline labs: INR 1.4; H/H and plts WNL; D-dimer 1.02  DDIs:  Warfarin + levothyroxine >> may increase INR Warfarin + meloxicam >> may increase risk of bleeding.  Date INR Dose 9/30 -- 7 mg  10/1 1.4 10 mg 10/2 1.4 7.5 mg  Goal of Therapy:  INR 2-3 Monitor platelets by anticoagulation protocol: Yes   Plan:  INR is subtherapeutic. Will give warfarin 7.5 mg x 1 tonight (~ 10% increase from home dose). Expect INR to trend up tomorrow from 10 mg dose. Daily  INR ordered. CBC at least every 3 days. Discontinue enoxaparin bridge once INR > 2.   Oswald Hillock, PharmD 06/20/2021,8:30 AM

## 2021-06-20 NOTE — Progress Notes (Signed)
PHARMACY - PHYSICIAN COMMUNICATION CRITICAL VALUE ALERT - BLOOD CULTURE IDENTIFICATION (BCID)  Suzanne Dixon is an 70 y.o. female who [facility] with a chief complaint of  body aches and chills, found to be COVID positive.   Assessment: 1/4 bottles growing gram positive rods (anaerobic bottle)    Name of physician (or Provider) Contacted: Dr. Damita Dunnings   Current antibiotics: None, patient is COVID positive on remdesivir   Changes to prescribed antibiotics recommended:  No new orders at this time   No results found for this or any previous visit.  Darnelle Bos, PharmD 06/20/2021  7:52 PM

## 2021-06-20 NOTE — Progress Notes (Addendum)
PROGRESS NOTE    Suzanne Dixon  DSK:[id number] DOB: 09-Sep-1951 DOA: 06/19/2021 PCP: Adin Hector, MD    Brief Narrative:   Suzanne Dixon is a 70 y.o. female with hx of A. fib, breast cancer, CVA, polyneuropathy, lymphocytic leukemia, presents with altered mental status.She reports she had COVID 2 months prior.  She endorses body aches and chills.  Denies any other symptoms.  She is unsure where she is.COVID test returned positive.  UA showed extremely concentrated urine and elevated pH.  Blood cultures were obtained.  CT chest abdomen and pelvis was obtained which showed no PE in the chest and no acute findings in the abdomen.  CT head without contrast was also unremarkable.  She was given a 1 L normal saline bolus, started on broad-spectrum antibiotics, and admitted for further management.    Consultants:    Procedures:  CT a/p 1. Technically adequate exam showing no acute pulmonary embolus. 2.  Aortic atherosclerosis.  (ICD10-I70.0) 3. Cholecystectomy. 4. Bilateral renal cysts.  No acute urinary tract abnormality. 5. Postoperative changes in the small bowel. Uncomplicated Roux-en-Y anastomosis. 6. Prior hysterectomy. 7. Prior RIGHT mastectomy. Antimicrobials:  Remdesivir   Subjective: No sob, cp. Interactive with me. Appropriate response to my questions.  She does not remember what happened to her yesterday but she is aware where she is today and what is going on.  Objective: Vitals:   06/19/21 1810 06/19/21 1912 06/19/21 1940 06/20/21 0532  BP:  (!) 174/99 (!) 148/97 139/86  Pulse:  (!) 110 (!) 109 75  Resp: (!) 22 (!) 36 20 16  Temp:  98.6 F (37 C) 98.5 F (36.9 C) 97.7 F (36.5 C)  TempSrc:  Oral Oral Oral  SpO2:  97% 97% 95%  Weight:      Height:        Intake/Output Summary (Last 24 hours) at 06/20/2021 0929 Last data filed at 06/20/2021 0500 Gross per 24 hour  Intake 530 ml  Output 200 ml  Net 330 ml   Filed Weights   06/19/21 1258  Weight: 113.3  kg    Examination:  General exam: Appears calm and comfortable  Respiratory system: Clear to auscultation. Respiratory effort normal. Cardiovascular system: S1 & S2 heard, RRR. No JVD, murmurs, rubs, gallops or clicks.  Gastrointestinal system: Abdomen is nondistended, soft and nontender.  Normal bowel sounds heard. Central nervous system: Alert and oriented.x4 Extremities:no edema Psychiatry: Mood & affect appropriate.     Data Reviewed: I have personally reviewed following labs and imaging studies  CBC: Recent Labs  Lab 06/19/21 1145 06/20/21 0446  WBC 8.5 7.3  NEUTROABS 4.2 3.1  HGB 14.2 12.4  HCT 41.1 36.7  MCV 91.3 92.2  PLT 324 620   Basic Metabolic Panel: Recent Labs  Lab 06/19/21 1145 06/20/21 0446  NA 141 140  K 3.9 3.5  CL 101 106  CO2 26 28  GLUCOSE 115* 93  BUN 11 9  CREATININE 0.98 0.75  CALCIUM 9.0 8.3*  MG  --  1.6*  PHOS  --  3.9   GFR: Estimated Creatinine Clearance: 78.4 mL/min (by C-G formula based on SCr of 0.75 mg/dL). Liver Function Tests: Recent Labs  Lab 06/19/21 1145 06/20/21 0446  AST 37 24  ALT 17 <5  ALKPHOS 68 60  BILITOT 1.2 0.9  PROT 7.1 6.0*  ALBUMIN 3.5 2.9*   No results for input(s): LIPASE, AMYLASE in the last 168 hours. No results for input(s): AMMONIA in the last  168 hours. Coagulation Profile: Recent Labs  Lab 06/19/21 1354 06/20/21 0446  INR 1.4* 1.4*   Cardiac Enzymes: No results for input(s): CKTOTAL, CKMB, CKMBINDEX, TROPONINI in the last 168 hours. BNP (last 3 results) No results for input(s): PROBNP in the last 8760 hours. HbA1C: No results for input(s): HGBA1C in the last 72 hours. CBG: No results for input(s): GLUCAP in the last 168 hours. Lipid Profile: No results for input(s): CHOL, HDL, LDLCALC, TRIG, CHOLHDL, LDLDIRECT in the last 72 hours. Thyroid Function Tests: No results for input(s): TSH, T4TOTAL, FREET4, T3FREE, THYROIDAB in the last 72 hours. Anemia Panel: Recent Labs     06/19/21 1145 06/20/21 0446  FERRITIN 150 115   Sepsis Labs: Recent Labs  Lab 06/19/21 1145 06/19/21 1354 06/19/21 1730  PROCALCITON <0.10  --   --   LATICACIDVEN 3.2* 2.0* 1.3    Recent Results (from the past 240 hour(s))  Blood culture (single)     Status: None (Preliminary result)   Collection Time: 06/19/21 11:45 AM   Specimen: BLOOD  Result Value Ref Range Status   Specimen Description BLOOD BLOOD LEFT HAND  Final   Special Requests   Final    BOTTLES DRAWN AEROBIC AND ANAEROBIC Blood Culture adequate volume   Culture   Final    NO GROWTH < 24 HOURS Performed at [facility], [address] [place], [place] [address]    Report Status PENDING  Incomplete  Resp Panel by RT-PCR (Flu A&B, Covid) Nasopharyngeal Swab     Status: Abnormal   Collection Time: 06/19/21 12:00 PM   Specimen: Nasopharyngeal Swab; Nasopharyngeal(NP) swabs in vial transport medium  Result Value Ref Range Status   SARS Coronavirus 2 by RT PCR POSITIVE (A) NEGATIVE Final    Comment: RESULT CALLED TO, READ BACK BY AND VERIFIED WITH: C/RONNIE PFLUGER 06/19/21 1344 AMK (NOTE) SARS-CoV-2 target nucleic acids are DETECTED.  The SARS-CoV-2 RNA is generally detectable in upper respiratory specimens during the acute phase of infection. Positive results are indicative of the presence of the identified virus, but do not rule out bacterial infection or co-infection with other pathogens not detected by the test. Clinical correlation with patient history and other diagnostic information is necessary to determine patient infection status. The expected result is Negative.  Fact Sheet for Patients: EntrepreneurPulse.com.au  Fact Sheet for Healthcare Providers: IncredibleEmployment.be  This test is not yet approved or cleared by the [place] FDA and  has been authorized for detection and/or diagnosis of SARS-CoV-2 by FDA under an Emergency Use Authorization  (EUA).  This EUA will remain in effect (meaning this test can be  used) for the duration of  the COVID-19 declaration under Section 564(b)(1) of the Act, 21 U.S.C. section 360bbb-3(b)(1), unless the authorization is terminated or revoked sooner.     Influenza A by PCR NEGATIVE NEGATIVE Final   Influenza B by PCR NEGATIVE NEGATIVE Final    Comment: (NOTE) The Xpert Xpress SARS-CoV-2/FLU/RSV plus assay is intended as an aid in the diagnosis of influenza from Nasopharyngeal swab specimens and should not be used as a sole basis for treatment. Nasal washings and aspirates are unacceptable for Xpert Xpress SARS-CoV-2/FLU/RSV testing.  Fact Sheet for Patients: EntrepreneurPulse.com.au  Fact Sheet for Healthcare Providers: IncredibleEmployment.be  This test is not yet approved or cleared by the [place] FDA and has been authorized for detection and/or diagnosis of SARS-CoV-2 by FDA under an Emergency Use Authorization (EUA). This EUA will remain in effect (meaning this test  can be used) for the duration of the COVID-19 declaration under Section 564(b)(1) of the Act, 21 U.S.C. section 360bbb-3(b)(1), unless the authorization is terminated or revoked.  Performed at [facility], [address] [place], [place] [address]   Blood culture (single)     Status: None (Preliminary result)   Collection Time: 06/19/21  1:18 PM   Specimen: BLOOD  Result Value Ref Range Status   Specimen Description BLOOD LEFT ANTECUBITAL  Final   Special Requests   Final    BOTTLES DRAWN AEROBIC AND ANAEROBIC Blood Culture adequate volume   Culture   Final    NO GROWTH < 24 HOURS Performed at [facility], [address], [place], [place] [address]    Report Status PENDING  Incomplete         Radiology Studies: CT HEAD WO CONTRAST (5MM)  Result Date: 06/19/2021 CLINICAL DATA:  Mental status change EXAM: CT HEAD WITHOUT CONTRAST TECHNIQUE:  Contiguous axial images were obtained from the base of the skull through the vertex without intravenous contrast. COMPARISON:  CT head dated Feb 01, 2021 FINDINGS: Brain: Mild chronic white matter ischemic change. No evidence of acute infarction, hemorrhage, hydrocephalus, extra-axial collection or mass lesion/mass effect. Vascular: No hyperdense vessel or unexpected calcification. Skull: Normal. Negative for fracture or focal lesion. Sinuses/Orbits: No acute finding. Other: None. IMPRESSION: No acute intracranial abnormality. Electronically Signed   By: Yetta Glassman M.D.   On: 06/19/2021 14:20   CT Angio Chest PE W and/or Wo Contrast  Result Date: 06/19/2021 CLINICAL DATA:  Nausea and vomiting. Altered from baseline. Feels bad. EXAM: CT ANGIOGRAPHY CHEST CT ABDOMEN AND PELVIS WITH CONTRAST TECHNIQUE: Multidetector CT imaging of the chest was performed using the standard protocol during bolus administration of intravenous contrast. Multiplanar CT image reconstructions and MIPs were obtained to evaluate the vascular anatomy. Multidetector CT imaging of the abdomen and pelvis was performed using the standard protocol during bolus administration of intravenous contrast. CONTRAST:  145mL OMNIPAQUE IOHEXOL 350 MG/ML SOLN COMPARISON:  06/11/2014 FINDINGS: CTA CHEST FINDINGS Cardiovascular: Heart size is normal. No pericardial effusion. Pulmonary arteries are well opacified. No acute pulmonary embolus. There is minimal atherosclerotic calcification of the thoracic aorta, not associated with aneurysm. Mediastinum/Nodes: The visualized portion of the thyroid gland has a normal appearance. No significant mediastinal, hilar, or axillary adenopathy. Esophagus is unremarkable. Lungs/Pleura: There is minimal subsegmental atelectasis in the RIGHT UPPER lobe. No pulmonary nodules, pleural effusions, or infiltrates. Airways are patent. Musculoskeletal: Significant degenerative changes throughout the thoracic spine. No  suspicious lytic or blastic lesions. Review of the MIP images confirms the above findings. CT ABDOMEN and PELVIS FINDINGS Hepatobiliary: Within the posterior segment of the RIGHT hepatic lobe there is a 7 millimeter low-attenuation mass which is not further characterized. Although not well seen on prior study, the small size limits adequate comparison. Gallbladder is absent., Pancreas: Unremarkable. No pancreatic ductal dilatation or surrounding inflammatory changes. Spleen: Normal in size without focal abnormality. Adrenals/Urinary Tract: Adrenal glands are normal. There are bilateral renal cysts, largest on the RIGHT measuring 4.4 centimeters in the LOWER pole region. No hydronephrosis. Ureters are unremarkable. The bladder and visualized portion of the urethra are normal. Stomach/Bowel: Uncomplicated appearance of Roux-en-Y anastomosis. No small bowel dilatation. Small bowel suture lines are identified in the RIGHT mid abdomen and LEFT mid abdomen. Moderate stool burden. Scattered colonic diverticula without acute diverticulitis. The appendix is well seen and has a normal appearance. Vascular/Lymphatic: There is atherosclerotic calcification of the abdominal  aorta not associated with aneurysm. No retroperitoneal or mesenteric adenopathy. Reproductive: Hysterectomy. Other: No ascites.  Prior RIGHT mastectomy. Musculoskeletal: Significant degenerative changes throughout the LOWER thoracic and lumbar spine. Review of the MIP images confirms the above findings. IMPRESSION: 1. Technically adequate exam showing no acute pulmonary embolus. 2.  Aortic atherosclerosis.  (ICD10-I70.0) 3. Cholecystectomy. 4. Bilateral renal cysts.  No acute urinary tract abnormality. 5. Postoperative changes in the small bowel. Uncomplicated Roux-en-Y anastomosis. 6. Prior hysterectomy. 7. Prior RIGHT mastectomy. Electronically Signed   By: Nolon Nations M.D.   On: 06/19/2021 14:36   CT ABDOMEN PELVIS W CONTRAST  Result Date:  06/19/2021 CLINICAL DATA:  Nausea and vomiting. Altered from baseline. Feels bad. EXAM: CT ANGIOGRAPHY CHEST CT ABDOMEN AND PELVIS WITH CONTRAST TECHNIQUE: Multidetector CT imaging of the chest was performed using the standard protocol during bolus administration of intravenous contrast. Multiplanar CT image reconstructions and MIPs were obtained to evaluate the vascular anatomy. Multidetector CT imaging of the abdomen and pelvis was performed using the standard protocol during bolus administration of intravenous contrast. CONTRAST:  159mL OMNIPAQUE IOHEXOL 350 MG/ML SOLN COMPARISON:  06/11/2014 FINDINGS: CTA CHEST FINDINGS Cardiovascular: Heart size is normal. No pericardial effusion. Pulmonary arteries are well opacified. No acute pulmonary embolus. There is minimal atherosclerotic calcification of the thoracic aorta, not associated with aneurysm. Mediastinum/Nodes: The visualized portion of the thyroid gland has a normal appearance. No significant mediastinal, hilar, or axillary adenopathy. Esophagus is unremarkable. Lungs/Pleura: There is minimal subsegmental atelectasis in the RIGHT UPPER lobe. No pulmonary nodules, pleural effusions, or infiltrates. Airways are patent. Musculoskeletal: Significant degenerative changes throughout the thoracic spine. No suspicious lytic or blastic lesions. Review of the MIP images confirms the above findings. CT ABDOMEN and PELVIS FINDINGS Hepatobiliary: Within the posterior segment of the RIGHT hepatic lobe there is a 7 millimeter low-attenuation mass which is not further characterized. Although not well seen on prior study, the small size limits adequate comparison. Gallbladder is absent., Pancreas: Unremarkable. No pancreatic ductal dilatation or surrounding inflammatory changes. Spleen: Normal in size without focal abnormality. Adrenals/Urinary Tract: Adrenal glands are normal. There are bilateral renal cysts, largest on the RIGHT measuring 4.4 centimeters in the LOWER pole  region. No hydronephrosis. Ureters are unremarkable. The bladder and visualized portion of the urethra are normal. Stomach/Bowel: Uncomplicated appearance of Roux-en-Y anastomosis. No small bowel dilatation. Small bowel suture lines are identified in the RIGHT mid abdomen and LEFT mid abdomen. Moderate stool burden. Scattered colonic diverticula without acute diverticulitis. The appendix is well seen and has a normal appearance. Vascular/Lymphatic: There is atherosclerotic calcification of the abdominal aorta not associated with aneurysm. No retroperitoneal or mesenteric adenopathy. Reproductive: Hysterectomy. Other: No ascites.  Prior RIGHT mastectomy. Musculoskeletal: Significant degenerative changes throughout the LOWER thoracic and lumbar spine. Review of the MIP images confirms the above findings. IMPRESSION: 1. Technically adequate exam showing no acute pulmonary embolus. 2.  Aortic atherosclerosis.  (ICD10-I70.0) 3. Cholecystectomy. 4. Bilateral renal cysts.  No acute urinary tract abnormality. 5. Postoperative changes in the small bowel. Uncomplicated Roux-en-Y anastomosis. 6. Prior hysterectomy. 7. Prior RIGHT mastectomy. Electronically Signed   By: Nolon Nations M.D.   On: 06/19/2021 14:36   DG Chest Portable 1 View  Result Date: 06/19/2021 CLINICAL DATA:  Altered mental status. EXAM: PORTABLE CHEST 1 VIEW COMPARISON:  02/01/2021 FINDINGS: Heart size is normal. No focal consolidations or pleural effusions. Mild bronchial wall thickening, similar to prior. No pulmonary edema. Remote cervical spine fusion and RIGHT shoulder arthroplasty. Chronic changes in  the LEFT shoulder. IMPRESSION: Bronchitic changes.  No evidence for acute  abnormality. Electronically Signed   By: Nolon Nations M.D.   On: 06/19/2021 12:27        Scheduled Meds:  allopurinol  300 mg Oral Daily   carbidopa-levodopa  1.5 tablet Oral TID   DULoxetine  60 mg Oral BID   enoxaparin (LOVENOX) injection  1 mg/kg Subcutaneous  Q12H   furosemide  40 mg Oral Daily   gabapentin  600 mg Oral QID   levothyroxine  50 mcg Oral Q0600   meloxicam  15 mg Oral Daily   metoprolol tartrate  200 mg Oral BID   oxybutynin  5 mg Oral BID   pantoprazole  40 mg Oral Daily   potassium chloride  10 mEq Oral TID   sodium chloride flush  3 mL Intravenous Q12H   vitamin B-12  1,000 mcg Oral Daily   warfarin  7.5 mg Oral ONCE-1600   Warfarin - Pharmacist Dosing Inpatient   Does not apply q1600   Continuous Infusions:  magnesium sulfate bolus IVPB     remdesivir 100 mg in NS 100 mL 100 mg (06/20/21 0926)    Assessment & Plan:   Active Problems:   AF (paroxysmal atrial fibrillation) (HCC)   B12 deficiency   Depression, major, recurrent, moderate (HCC)   Breast cancer (HCC)   Large granular lymphocytic leukemia (HCC)   Polyneuropathy associated with underlying disease ([place])   CVA (cerebral vascular accident) ([place])   COVID-19 virus infection   LADAISHA PORTILLO is a 70 y.o. female with hx of A. fib, breast cancer, CVA, polyneuropathy, lymphocytic leukemia, presents with altered mental status in setting of new COVID infection.   #Altered mental status #Delirium #Acute metabolic encephalopathy #[id number] infection #Lactic acidosis Patient presenting with altered mental status consistent with delirium in setting of COVID-19 infection and signs of dehydration.  Lactic acidosis has resolved with fluid resuscitation.  - Discontinue broad-spectrum antibiotics - Remdesivir per pharmacy protocol - Routine COVID daily labs - Not hypoxic, defer steroids at this time   #Elevated troponin Asx.  Will consult cardiology Will obtain echo   #Chronic medical problems Gout-continue allopurinol  Parkinson's-continue Sinemet  Neuropathy-continue Cymbalta and gabapentin    Lower extremity edema-continue Lasix  Hypothyroidism-continue Synthroid   A. fib-continue metoprolol, warfarin.   INR subtherapeutic  Pharmacy dosing    GERD-continue PPI  OAB-continue oxybutynin   DVT prophylaxis: On Coumadin Code Status: Full Family Communication: None at bedside Disposition Plan:  Status is: Inpatient  Patient remains inpatient due to complexity of illness, requiring IV treatment and hospitalization  Dispo: The patient is from: Home              Anticipated d/c is to: Home              Patient currently is not medically stable to d/c.   Difficult to place patient No            LOS: 1 day   Time spent: 35 minutes with more than 50% on [place], MD [facility] Pager [phone]  If 7PM-7AM, please contact night-coverage 06/20/2021, 9:29 AM

## 2021-06-21 ENCOUNTER — Inpatient Hospital Stay
Admit: 2021-06-21 | Discharge: 2021-06-21 | Disposition: A | Discharge status: Home / Self Care | Payer: Medicare HMO | Attending: Family Medicine | Admitting: Family Medicine

## 2021-06-21 DIAGNOSIS — C50411 Malignant neoplasm of upper-outer quadrant of right female breast: Secondary | ICD-10-CM | POA: Diagnosis not present

## 2021-06-21 DIAGNOSIS — I480 Paroxysmal atrial fibrillation: Secondary | ICD-10-CM | POA: Diagnosis not present

## 2021-06-21 DIAGNOSIS — K219 Gastro-esophageal reflux disease without esophagitis: Secondary | ICD-10-CM

## 2021-06-21 DIAGNOSIS — E538 Deficiency of other specified B group vitamins: Secondary | ICD-10-CM | POA: Diagnosis not present

## 2021-06-21 DIAGNOSIS — U071 COVID-19: Secondary | ICD-10-CM | POA: Diagnosis not present

## 2021-06-21 LAB — ECHOCARDIOGRAM COMPLETE
AR max vel: 1.65 cm2
AV Area VTI: 2.05 cm2
AV Area mean vel: 1.73 cm2
AV Mean grad: 4 mmHg
AV Peak grad: 7 mmHg
Ao pk vel: 1.32 m/s
Area-P 1/2: 3.03 cm2
Calc EF: 43.2 %
Height: 64 in
MV VTI: 2.13 cm2
S' Lateral: 0.9 cm
Single Plane A2C EF: 41.6 %
Single Plane A4C EF: 46.1 %
Weight: 3996.8 oz

## 2021-06-21 LAB — CBC WITH DIFFERENTIAL/PLATELET
Abs Immature Granulocytes: 0.02 10*3/uL (ref 0.00–0.07)
Basophils Absolute: 0.1 10*3/uL (ref 0.0–0.1)
Basophils Relative: 1 %
Eosinophils Absolute: 0.1 10*3/uL (ref 0.0–0.5)
Eosinophils Relative: 1 %
HCT: 36.9 % (ref 36.0–46.0)
Hemoglobin: 12.5 g/dL (ref 12.0–15.0)
Immature Granulocytes: 0 %
Lymphocytes Relative: 47 %
Lymphs Abs: 2.9 10*3/uL (ref 0.7–4.0)
MCH: 31.2 pg (ref 26.0–34.0)
MCHC: 33.9 g/dL (ref 30.0–36.0)
MCV: 92 fL (ref 80.0–100.0)
Monocytes Absolute: 1 10*3/uL (ref 0.1–1.0)
Monocytes Relative: 16 %
Neutro Abs: 2.1 10*3/uL (ref 1.7–7.7)
Neutrophils Relative %: 35 %
Platelets: 259 10*3/uL (ref 150–400)
RBC: 4.01 MIL/uL (ref 3.87–5.11)
RDW: 14.2 % (ref 11.5–15.5)
WBC: 6.1 10*3/uL (ref 4.0–10.5)
nRBC: 0 % (ref 0.0–0.2)

## 2021-06-21 LAB — COMPREHENSIVE METABOLIC PANEL
ALT: <5 U/L (ref 0–44)
AST: 25 U/L (ref 15–41)
Albumin: 2.8 g/dL — ABNORMAL LOW (ref 3.5–5.0)
Alkaline Phosphatase: 55 U/L (ref 38–126)
Anion gap: 5 (ref 5–15)
BUN: 13 mg/dL (ref 8–23)
CO2: 28 mmol/L (ref 22–32)
Calcium: 8.2 mg/dL — ABNORMAL LOW (ref 8.9–10.3)
Chloride: 105 mmol/L (ref 98–111)
Creatinine, Ser: 0.65 mg/dL (ref 0.44–1.00)
GFR, Estimated: >60 mL/min (ref >60)
Glucose, Bld: 88 mg/dL (ref 70–99)
Potassium: 3.6 mmol/L (ref 3.5–5.1)
Sodium: 138 mmol/L (ref 135–145)
Total Bilirubin: 0.6 mg/dL (ref 0.3–1.2)
Total Protein: 5.8 g/dL — ABNORMAL LOW (ref 6.5–8.1)

## 2021-06-21 LAB — PROTIME-INR
INR: 1.7 — ABNORMAL HIGH (ref 0.8–1.2)
Prothrombin Time: 19.6 seconds — ABNORMAL HIGH (ref 11.4–15.2)

## 2021-06-21 LAB — C-REACTIVE PROTEIN: CRP: 0.6 mg/dL (ref <1.0)

## 2021-06-21 LAB — FERRITIN: Ferritin: 115 ng/mL (ref 11–307)

## 2021-06-21 LAB — MAGNESIUM: Magnesium: 1.8 mg/dL (ref 1.7–2.4)

## 2021-06-21 LAB — PHOSPHORUS: Phosphorus: 3.7 mg/dL (ref 2.5–4.6)

## 2021-06-21 LAB — D-DIMER, QUANTITATIVE: D-Dimer, Quant: 0.69 ug/mL-FEU — ABNORMAL HIGH (ref 0.00–0.50)

## 2021-06-21 MED ORDER — WARFARIN SODIUM 7.5 MG PO TABS
7.5000 mg | ORAL_TABLET | Freq: Once | ORAL | Status: AC
  Administered 2021-06-21: 7.5 mg via ORAL
  Filled 2021-06-21: qty 1

## 2021-06-21 MED ORDER — PERFLUTREN LIPID MICROSPHERE
1.0000 mL | INTRAVENOUS | Status: AC | PRN
  Administered 2021-06-21: 2 mL via INTRAVENOUS
  Filled 2021-06-21: qty 10

## 2021-06-21 NOTE — Progress Notes (Addendum)
PROGRESS NOTE    Suzanne Dixon  OTL:[id number] DOB: 1950/10/25 DOA: 06/19/2021 PCP: Adin Hector, MD    Brief Narrative:   Suzanne Dixon is a 70 y.o. female with hx of A. fib, breast cancer, CVA, polyneuropathy, lymphocytic leukemia, presents with altered mental status.She reports she had COVID 2 months prior.  She endorses body aches and chills.  Denies any other symptoms.  She is unsure where she is.COVID test returned positive.  UA showed extremely concentrated urine and elevated pH.  Blood cultures were obtained.  CT chest abdomen and pelvis was obtained which showed no PE in the chest and no acute findings in the abdomen.  CT head without contrast was also unremarkable.  She was given a 1 L normal saline bolus, started on broad-spectrum antibiotics, and admitted for further management.  10/3 no complaints of sob, cp, n/v this am. Eating breakfast  Consultants:    Procedures:  CT a/p 1. Technically adequate exam showing no acute pulmonary embolus. 2.  Aortic atherosclerosis.  (ICD10-I70.0) 3. Cholecystectomy. 4. Bilateral renal cysts.  No acute urinary tract abnormality. 5. Postoperative changes in the small bowel. Uncomplicated Roux-en-Y anastomosis. 6. Prior hysterectomy. 7. Prior RIGHT mastectomy. Antimicrobials:  Remdesivir   Subjective: No abd pain  Objective: Vitals:   06/20/21 0930 06/20/21 1554 06/20/21 2102 06/21/21 0407  BP: 140/87 111/69 121/78 112/66  Pulse: 84 72 80 64  Resp: 18 18 18 20   Temp: 98.1 F (36.7 C) 98.6 F (37 C) 98 F (36.7 C) (!) 97.5 F (36.4 C)  TempSrc: Oral  Oral Oral  SpO2: 100% 99% 99% 97%  Weight:      Height:        Intake/Output Summary (Last 24 hours) at 06/21/2021 0802 Last data filed at 06/21/2021 0412 Gross per 24 hour  Intake 390 ml  Output 1900 ml  Net -1510 ml   Filed Weights   06/19/21 1258  Weight: 113.3 kg    Examination: Nad, calm Cta no w/r Rrr s1/s2 no gallop Soft benign +bs No  edema Aaxox3 Mood and affect appropriate in current setting    Data Reviewed: I have personally reviewed following labs and imaging studies  CBC: Recent Labs  Lab 06/19/21 1145 06/20/21 0446 06/21/21 0437  WBC 8.5 7.3 6.1  NEUTROABS 4.2 3.1 2.1  HGB 14.2 12.4 12.5  HCT 41.1 36.7 36.9  MCV 91.3 92.2 92.0  PLT 324 277 974   Basic Metabolic Panel: Recent Labs  Lab 06/19/21 1145 06/20/21 0446 06/21/21 0437  NA 141 140 138  K 3.9 3.5 3.6  CL 101 106 105  CO2 26 28 28   GLUCOSE 115* 93 88  BUN 11 9 13   CREATININE 0.98 0.75 0.65  CALCIUM 9.0 8.3* 8.2*  MG  --  1.6* 1.8  PHOS  --  3.9 3.7   GFR: Estimated Creatinine Clearance: 78.4 mL/min (by C-G formula based on SCr of 0.65 mg/dL). Liver Function Tests: Recent Labs  Lab 06/19/21 1145 06/20/21 0446 06/21/21 0437  AST 37 24 25  ALT 17 <5 <5  ALKPHOS 68 60 55  BILITOT 1.2 0.9 0.6  PROT 7.1 6.0* 5.8*  ALBUMIN 3.5 2.9* 2.8*   No results for input(s): LIPASE, AMYLASE in the last 168 hours. No results for input(s): AMMONIA in the last 168 hours. Coagulation Profile: Recent Labs  Lab 06/19/21 1354 06/20/21 0446 06/21/21 0437  INR 1.4* 1.4* 1.7*   Cardiac Enzymes: No results for input(s): CKTOTAL, CKMB, CKMBINDEX, TROPONINI  in the last 168 hours. BNP (last 3 results) No results for input(s): PROBNP in the last 8760 hours. HbA1C: No results for input(s): HGBA1C in the last 72 hours. CBG: No results for input(s): GLUCAP in the last 168 hours. Lipid Profile: No results for input(s): CHOL, HDL, LDLCALC, TRIG, CHOLHDL, LDLDIRECT in the last 72 hours. Thyroid Function Tests: No results for input(s): TSH, T4TOTAL, FREET4, T3FREE, THYROIDAB in the last 72 hours. Anemia Panel: Recent Labs    06/20/21 0446 06/21/21 0437  FERRITIN 115 115   Sepsis Labs: Recent Labs  Lab 06/19/21 1145 06/19/21 1354 06/19/21 1730  PROCALCITON <0.10  --   --   LATICACIDVEN 3.2* 2.0* 1.3    Recent Results (from the past 240  hour(s))  Blood culture (single)     Status: None (Preliminary result)   Collection Time: 06/19/21 11:45 AM   Specimen: BLOOD  Result Value Ref Range Status   Specimen Description   Final    BLOOD BLOOD LEFT HAND Performed at [facility], [address] [place], [place] [address]    Special Requests   Final    BOTTLES DRAWN AEROBIC AND ANAEROBIC Blood Culture adequate volume Performed at [facility], [address], [place], [place] [address]    Culture  Setup Time   Final    GRAM POSITIVE RODS IN BOTH AEROBIC AND ANAEROBIC BOTTLES CRITICAL RESULT CALLED TO, READ BACK BY AND VERIFIED WITH: MORGAN HICKS AT 1927 06/20/21.PMF    Culture   Final    GRAM POSITIVE RODS TOO YOUNG TO READ Performed at [facility], [place] [address] [place], [place] [address]    Report Status PENDING  Incomplete  Resp Panel by RT-PCR (Flu A&B, Covid) Nasopharyngeal Swab     Status: Abnormal   Collection Time: 06/19/21 12:00 PM   Specimen: Nasopharyngeal Swab; Nasopharyngeal(NP) swabs in vial transport medium  Result Value Ref Range Status   SARS Coronavirus 2 by RT PCR POSITIVE (A) NEGATIVE Final    Comment: RESULT CALLED TO, READ BACK BY AND VERIFIED WITH: C/RONNIE PFLUGER 06/19/21 1344 AMK (NOTE) SARS-CoV-2 target nucleic acids are DETECTED.  The SARS-CoV-2 RNA is generally detectable in upper respiratory specimens during the acute phase of infection. Positive results are indicative of the presence of the identified virus, but do not rule out bacterial infection or co-infection with other pathogens not detected by the test. Clinical correlation with patient history and other diagnostic information is necessary to determine patient infection status. The expected result is Negative.  Fact Sheet for Patients: EntrepreneurPulse.com.au  Fact Sheet for Healthcare Providers: IncredibleEmployment.be  This test is not yet approved or cleared  by the [place] FDA and  has been authorized for detection and/or diagnosis of SARS-CoV-2 by FDA under an Emergency Use Authorization (EUA).  This EUA will remain in effect (meaning this test can be  used) for the duration of  the COVID-19 declaration under Section 564(b)(1) of the Act, 21 U.S.C. section 360bbb-3(b)(1), unless the authorization is terminated or revoked sooner.     Influenza A by PCR NEGATIVE NEGATIVE Final   Influenza B by PCR NEGATIVE NEGATIVE Final    Comment: (NOTE) The Xpert Xpress SARS-CoV-2/FLU/RSV plus assay is intended as an aid in the diagnosis of influenza from Nasopharyngeal swab specimens and should not be used as a sole basis for treatment. Nasal washings and aspirates are unacceptable for Xpert Xpress SARS-CoV-2/FLU/RSV testing.  Fact Sheet for Patients: EntrepreneurPulse.com.au  Fact Sheet for Healthcare Providers:  IncredibleEmployment.be  This test is not yet approved or cleared by the [place] and has been authorized for detection and/or diagnosis of SARS-CoV-2 by FDA under an Emergency Use Authorization (EUA). This EUA will remain in effect (meaning this test can be used) for the duration of the COVID-19 declaration under Section 564(b)(1) of the Act, 21 U.S.C. section 360bbb-3(b)(1), unless the authorization is terminated or revoked.  Performed at [facility], [address] [place], [place] [address]   Blood culture (single)     Status: None (Preliminary result)   Collection Time: 06/19/21  1:18 PM   Specimen: BLOOD  Result Value Ref Range Status   Specimen Description BLOOD LEFT ANTECUBITAL  Final   Special Requests   Final    BOTTLES DRAWN AEROBIC AND ANAEROBIC Blood Culture adequate volume   Culture   Final    NO GROWTH 2 DAYS Performed at [facility], [address], [place], [place] [address]    Report Status PENDING  Incomplete  Urine Culture     Status:  Abnormal (Preliminary result)   Collection Time: 06/19/21  2:30 PM   Specimen: Urine, Clean Catch  Result Value Ref Range Status   Specimen Description   Final    URINE, CLEAN CATCH Performed at [facility], [address], [place], [place] [address]    Special Requests   Final    NONE Performed at [facility], [address] [place], [place] [address]    Culture >=100,000 COLONIES/mL GRAM NEGATIVE RODS (A)  Final   Report Status PENDING  Incomplete         Radiology Studies: CT HEAD WO CONTRAST (5MM)  Result Date: 06/19/2021 CLINICAL DATA:  Mental status change EXAM: CT HEAD WITHOUT CONTRAST TECHNIQUE: Contiguous axial images were obtained from the base of the skull through the vertex without intravenous contrast. COMPARISON:  CT head dated Feb 01, 2021 FINDINGS: Brain: Mild chronic white matter ischemic change. No evidence of acute infarction, hemorrhage, hydrocephalus, extra-axial collection or mass lesion/mass effect. Vascular: No hyperdense vessel or unexpected calcification. Skull: Normal. Negative for fracture or focal lesion. Sinuses/Orbits: No acute finding. Other: None. IMPRESSION: No acute intracranial abnormality. Electronically Signed   By: Yetta Glassman M.D.   On: 06/19/2021 14:20   CT Angio Chest PE W and/or Wo Contrast  Result Date: 06/19/2021 CLINICAL DATA:  Nausea and vomiting. Altered from baseline. Feels bad. EXAM: CT ANGIOGRAPHY CHEST CT ABDOMEN AND PELVIS WITH CONTRAST TECHNIQUE: Multidetector CT imaging of the chest was performed using the standard protocol during bolus administration of intravenous contrast. Multiplanar CT image reconstructions and MIPs were obtained to evaluate the vascular anatomy. Multidetector CT imaging of the abdomen and pelvis was performed using the standard protocol during bolus administration of intravenous contrast. CONTRAST:  178mL OMNIPAQUE IOHEXOL 350 MG/ML SOLN COMPARISON:  06/11/2014 FINDINGS: CTA CHEST  FINDINGS Cardiovascular: Heart size is normal. No pericardial effusion. Pulmonary arteries are well opacified. No acute pulmonary embolus. There is minimal atherosclerotic calcification of the thoracic aorta, not associated with aneurysm. Mediastinum/Nodes: The visualized portion of the thyroid gland has a normal appearance. No significant mediastinal, hilar, or axillary adenopathy. Esophagus is unremarkable. Lungs/Pleura: There is minimal subsegmental atelectasis in the RIGHT UPPER lobe. No pulmonary nodules, pleural effusions, or infiltrates. Airways are patent. Musculoskeletal: Significant degenerative changes throughout the thoracic spine. No suspicious lytic or blastic lesions. Review of the MIP images confirms the above findings. CT ABDOMEN and PELVIS FINDINGS Hepatobiliary: Within the posterior segment  of the RIGHT hepatic lobe there is a 7 millimeter low-attenuation mass which is not further characterized. Although not well seen on prior study, the small size limits adequate comparison. Gallbladder is absent., Pancreas: Unremarkable. No pancreatic ductal dilatation or surrounding inflammatory changes. Spleen: Normal in size without focal abnormality. Adrenals/Urinary Tract: Adrenal glands are normal. There are bilateral renal cysts, largest on the RIGHT measuring 4.4 centimeters in the LOWER pole region. No hydronephrosis. Ureters are unremarkable. The bladder and visualized portion of the urethra are normal. Stomach/Bowel: Uncomplicated appearance of Roux-en-Y anastomosis. No small bowel dilatation. Small bowel suture lines are identified in the RIGHT mid abdomen and LEFT mid abdomen. Moderate stool burden. Scattered colonic diverticula without acute diverticulitis. The appendix is well seen and has a normal appearance. Vascular/Lymphatic: There is atherosclerotic calcification of the abdominal aorta not associated with aneurysm. No retroperitoneal or mesenteric adenopathy. Reproductive: Hysterectomy.  Other: No ascites.  Prior RIGHT mastectomy. Musculoskeletal: Significant degenerative changes throughout the LOWER thoracic and lumbar spine. Review of the MIP images confirms the above findings. IMPRESSION: 1. Technically adequate exam showing no acute pulmonary embolus. 2.  Aortic atherosclerosis.  (ICD10-I70.0) 3. Cholecystectomy. 4. Bilateral renal cysts.  No acute urinary tract abnormality. 5. Postoperative changes in the small bowel. Uncomplicated Roux-en-Y anastomosis. 6. Prior hysterectomy. 7. Prior RIGHT mastectomy. Electronically Signed   By: Nolon Nations M.D.   On: 06/19/2021 14:36   CT ABDOMEN PELVIS W CONTRAST  Result Date: 06/19/2021 CLINICAL DATA:  Nausea and vomiting. Altered from baseline. Feels bad. EXAM: CT ANGIOGRAPHY CHEST CT ABDOMEN AND PELVIS WITH CONTRAST TECHNIQUE: Multidetector CT imaging of the chest was performed using the standard protocol during bolus administration of intravenous contrast. Multiplanar CT image reconstructions and MIPs were obtained to evaluate the vascular anatomy. Multidetector CT imaging of the abdomen and pelvis was performed using the standard protocol during bolus administration of intravenous contrast. CONTRAST:  141mL OMNIPAQUE IOHEXOL 350 MG/ML SOLN COMPARISON:  06/11/2014 FINDINGS: CTA CHEST FINDINGS Cardiovascular: Heart size is normal. No pericardial effusion. Pulmonary arteries are well opacified. No acute pulmonary embolus. There is minimal atherosclerotic calcification of the thoracic aorta, not associated with aneurysm. Mediastinum/Nodes: The visualized portion of the thyroid gland has a normal appearance. No significant mediastinal, hilar, or axillary adenopathy. Esophagus is unremarkable. Lungs/Pleura: There is minimal subsegmental atelectasis in the RIGHT UPPER lobe. No pulmonary nodules, pleural effusions, or infiltrates. Airways are patent. Musculoskeletal: Significant degenerative changes throughout the thoracic spine. No suspicious lytic  or blastic lesions. Review of the MIP images confirms the above findings. CT ABDOMEN and PELVIS FINDINGS Hepatobiliary: Within the posterior segment of the RIGHT hepatic lobe there is a 7 millimeter low-attenuation mass which is not further characterized. Although not well seen on prior study, the small size limits adequate comparison. Gallbladder is absent., Pancreas: Unremarkable. No pancreatic ductal dilatation or surrounding inflammatory changes. Spleen: Normal in size without focal abnormality. Adrenals/Urinary Tract: Adrenal glands are normal. There are bilateral renal cysts, largest on the RIGHT measuring 4.4 centimeters in the LOWER pole region. No hydronephrosis. Ureters are unremarkable. The bladder and visualized portion of the urethra are normal. Stomach/Bowel: Uncomplicated appearance of Roux-en-Y anastomosis. No small bowel dilatation. Small bowel suture lines are identified in the RIGHT mid abdomen and LEFT mid abdomen. Moderate stool burden. Scattered colonic diverticula without acute diverticulitis. The appendix is well seen and has a normal appearance. Vascular/Lymphatic: There is atherosclerotic calcification of the abdominal aorta not associated with aneurysm. No retroperitoneal or mesenteric adenopathy. Reproductive: Hysterectomy. Other: No ascites.  Prior RIGHT mastectomy. Musculoskeletal: Significant degenerative changes throughout the LOWER thoracic and lumbar spine. Review of the MIP images confirms the above findings. IMPRESSION: 1. Technically adequate exam showing no acute pulmonary embolus. 2.  Aortic atherosclerosis.  (ICD10-I70.0) 3. Cholecystectomy. 4. Bilateral renal cysts.  No acute urinary tract abnormality. 5. Postoperative changes in the small bowel. Uncomplicated Roux-en-Y anastomosis. 6. Prior hysterectomy. 7. Prior RIGHT mastectomy. Electronically Signed   By: Nolon Nations M.D.   On: 06/19/2021 14:36   DG Chest Portable 1 View  Result Date: 06/19/2021 CLINICAL DATA:   Altered mental status. EXAM: PORTABLE CHEST 1 VIEW COMPARISON:  02/01/2021 FINDINGS: Heart size is normal. No focal consolidations or pleural effusions. Mild bronchial wall thickening, similar to prior. No pulmonary edema. Remote cervical spine fusion and RIGHT shoulder arthroplasty. Chronic changes in the LEFT shoulder. IMPRESSION: Bronchitic changes.  No evidence for acute  abnormality. Electronically Signed   By: Nolon Nations M.D.   On: 06/19/2021 12:27        Scheduled Meds:  allopurinol  300 mg Oral Daily   carbidopa-levodopa  1.5 tablet Oral TID   DULoxetine  60 mg Oral BID   enoxaparin (LOVENOX) injection  1 mg/kg Subcutaneous Q12H   furosemide  40 mg Oral Daily   gabapentin  600 mg Oral QID   levothyroxine  50 mcg Oral Q0600   meloxicam  15 mg Oral Daily   metoprolol tartrate  200 mg Oral BID   oxybutynin  5 mg Oral BID   pantoprazole  40 mg Oral Daily   potassium chloride  10 mEq Oral TID   sodium chloride flush  3 mL Intravenous Q12H   vitamin B-12  1,000 mcg Oral Daily   warfarin  7.5 mg Oral ONCE-1600   Warfarin - Pharmacist Dosing Inpatient   Does not apply q1600   Continuous Infusions:  remdesivir 100 mg in NS 100 mL Stopped (06/20/21 0956)    Assessment & Plan:   Active Problems:   AF (paroxysmal atrial fibrillation) (HCC)   B12 deficiency   Depression, major, recurrent, moderate (HCC)   Breast cancer (HCC)   Large granular lymphocytic leukemia (HCC)   Polyneuropathy associated with underlying disease ([place])   CVA (cerebral vascular accident) ([place])   COVID-19 virus infection   Suzanne Dixon is a 70 y.o. female with hx of A. fib, breast cancer, CVA, polyneuropathy, lymphocytic leukemia, presents with altered mental status in setting of new COVID infection.   #Altered mental status #Delirium #Acute metabolic encephalopathy #[id number] infection #Lactic acidosis Patient presenting with altered mental status consistent with delirium in setting of COVID-19  infection and signs of dehydration.  Lactic acidosis has resolved with fluid resuscitation.  - Discontinue broad-spectrum antibiotics 10/3 continue remdesivir Ferritin normal, CRP normal D-dimer trending down Not hypoxic will defer steroids at this time   #Elevated troponin Asymptomatic Cardiology consulted pending Echo pending   #Chronic medical problems Gout-continue allopurinol   Parkinson's-continue Sinemet  Neuropathy-continue Cymbalta and gabapentin    Lower extremity edema-continue Lasix  Hypothyroidism-continue Synthroid   A. fib-continue metoprolol, warfarin.   INR subtherapeutic  Pharmacy dosing   GERD-continue PPI  OAB-continue oxybutynin   DVT prophylaxis: On Coumadin Code Status: Full Family Communication: None at bedside Disposition Plan:  Status is: Inpatient  Patient remains inpatient due to complexity of illness, requiring IV treatment and hospitalization Dispo: The patient is from: Home              Anticipated d/c is to: Home  Patient currently is not medically stable to d/c.   Difficult to place patient No            LOS: 2 days   Time spent: 35 minutes with more than 50% on [place], MD [facility] Pager [phone]  If 7PM-7AM, please contact night-coverage 06/21/2021, 8:02 AM

## 2021-06-21 NOTE — Progress Notes (Signed)
*  PRELIMINARY RESULTS* Echocardiogram 2D Echocardiogram has been performed.  Wallie Char Karie Skowron 06/21/2021, 9:08 AM

## 2021-06-21 NOTE — TOC Initial Note (Signed)
Transition of Care [facility]) - Initial/Assessment Note    Patient Details  Name: Suzanne Dixon MRN: [id number] Date of Birth: 10/18/1950  Transition of [facility] CM/SW Contact:    Candie Chroman, LCSW Phone Number: 06/21/2021, 4:12 PM  Clinical Narrative:   Readmission prevention screen complete. Patient on COVID isolation precautions. CSW introduced role and explained that discharge planning would be discussed. PCP is Ramonita Lab, MD. Patient reprted she was able to drive until recently and now she has to get someone to take her appointments. [facility] [address]. No issues obtaining medications. Patient was active with [facility] for PT and RN prior to admission. She also has someone that comes to her home 3-4 hours per day, 7 days a week. She has a walker and 4-prong cane at home. Patient is not interested in SNF placement. [facility] is able to add OT, aide, SW to her current services. Per RN, daughter Estill Bamberg requesting call. Patient gave permission to call her back. Patient notes that she does not have a documented HCPOA but would want her daughter [name] to be her Media planner. Asked RN to place chaplain consult for HCPOA. Daughter Estill Bamberg wants to talk to patient about going to SNF. Will call her back tomorrow. Explained to her that referral cannot be sent out without patient's permission.               Expected Discharge Plan: [place] Barriers to Discharge: Continued Medical Work up   Patient Goals and CMS Choice        Expected Discharge Plan and Services Expected Discharge Plan: [place] Acute Care Choice: Resumption of Svcs/PTA Provider Living arrangements for the past 2 months: Single Family Home                           HH Arranged: RN, PT, OT, Nurse's Aide, Social Work [employer] Agency: [place] Date [place]: 06/21/21   Representative spoke with at [place]: [place]  Pack  Prior Living Arrangements/Services Living arrangements for the past 2 months: [place] with:: Self Patient language and need for interpreter reviewed:: Yes Do you feel safe going back to the place where you live?: Yes      Need for Family Participation in Patient Care: Yes (Comment) Care giver support system in place?: Yes (comment) Current home services: DME, Home PT, Home RN, Actuary Criminal Activity/Legal Involvement Pertinent to Current Situation/Hospitalization: No - Comment as needed  Activities of Daily Living Home Assistive Devices/Equipment: Environmental consultant (specify type) ADL Screening (condition at time of admission) Patient's cognitive ability adequate to safely complete daily activities?: Yes Is the patient deaf or have difficulty hearing?: No Does the patient have difficulty seeing, even when wearing glasses/contacts?: No Does the patient have difficulty concentrating, remembering, or making decisions?: No Patient able to express need for assistance with ADLs?: Yes Does the patient have difficulty dressing or bathing?: No Independently performs ADLs?: No Communication: Independent Dressing (OT): Needs assistance Is this a change from baseline?: Change from baseline, expected to last <3days Grooming: Independent Feeding: Independent Bathing: Needs assistance Is this a change from baseline?: Change from baseline, expected to last <3 days Toileting: Needs assistance Is this a change from baseline?: Change from baseline, expected to last <3 days In/Out Bed: Needs assistance Is this a change from baseline?: Change from baseline, expected  to last <3 days Walks in Home: Independent with device (comment) Does the patient have difficulty walking or climbing stairs?: Yes Weakness of Legs: Both Weakness of Arms/Hands: None  Permission Sought/Granted Permission sought to share information with : Facility Sport and exercise psychologist, Family Supports Permission granted  to share information with : Yes, Verbal Permission Granted  Share Information with NAME: Sarrinah Gardin  Permission granted to share info w AGENCY: [place] granted to share info w Relationship: Daughter  Permission granted to share info w Contact Information: [phone]  Emotional Assessment Appearance:: Appears stated age Attitude/Demeanor/Rapport: Engaged, Gracious Affect (typically observed): Appropriate, Calm, Pleasant Orientation: : Oriented to Self, Oriented to Place, Oriented to  Time, Oriented to Situation Alcohol / Substance Use: Not Applicable Psych Involvement: No (comment)  Admission diagnosis:  COVID-19 virus infection [U07.1] Patient Active Problem List   Diagnosis Date Noted   COVID-19 virus infection 06/19/2021   CVA (cerebral vascular accident) ([place]) 02/01/2021   Cellulitis 02/01/2021   Hypothyroidism    Left leg cellulitis    Acquired thrombophilia ([place]) 12/10/2020   Status post reverse total shoulder replacement, 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   Lymphedema 07/17/2020   Right rotator cuff tear arthropathy 04/07/2020   Edema of both legs 12/31/2019   Polyneuropathy associated with underlying disease ([place]) 12/31/2019   Swelling of limb 12/31/2019   Hematuria, microscopic 06/07/2018   Age-related osteoporosis without current pathological fracture 12/08/2017   Rotator cuff arthropathy, right 01/03/2017   Biceps tendinitis of right upper extremity 12/05/2016   Complete tear of right rotator cuff 12/05/2016   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   History of breast cancer in female 06/02/2016   Allergic state 04/01/2015   Arthritis 04/01/2015   Cancer ([place]) 04/01/2015   Edema leg 04/01/2015   Gout 04/01/2015   Hidradenitis 04/01/2015   Elevated lymphocyte count 04/01/2015   Disorder of peripheral nervous system 04/01/2015   Apnea, sleep 04/01/2015   Avitaminosis D 04/01/2015   Breast  cancer ([place]) 04/01/2015   Hypertension 04/15/2014   B12 deficiency 02/24/2014   AF (paroxysmal atrial fibrillation) ([place]) 02/17/2014   Adiposity 06/04/2012   Depression, major, recurrent, moderate ([place]) 03/26/2012   PCP:  Adin Hector, MD [id number] [address], [place] [address] Phone: [phone] Fax: [phone]     Social Determinants of Health (SDOH) Interventions    Readmission Risk Interventions Readmission Risk Prevention Plan 06/21/2021  Transportation Screening Complete  PCP or Specialist Appt within 3-5 Days Complete  HRI or Home Care Consult Complete  Social Work Consult for [place] Planning/Counseling Complete  Palliative Care Screening Not Applicable  Medication Review Press photographer) Complete  Some recent data might be hidden

## 2021-06-21 NOTE — Evaluation (Signed)
Occupational Therapy Evaluation Patient Details Name: Suzanne Dixon MRN: [id number] DOB: 08/23/1951 Today's Date: 06/21/2021   History of Present Illness Pt is a 70 y.o. female with hx of A. fib, breast cancer, CVA, polyneuropathy, Parkinson's Dz, and lymphocytic leukemia who presented with altered mental status. MD assessment includes: AMS, delirium, acute metabolic encephalopathy, [id number] infection, lactic acidosis, elevated troponin, and LE edema.   Clinical Impression   Patient presenting with decreased Ind in self care, balance, endurance, and safety awareness. Patient reports living at home alone. She has an aide come 7x/wk for ~ 1 hour to assist with home management tasks. Pt reports she is able perform all bathing and dressing herself at baseline. Patient currently functioning at min - mod A. Pt reports feeling "60 percent" of herself and does endorse fatigue this session. HEr children live several hours away. OT recommends short term rehab stay to address functional deficits before returning home. Patient will benefit from acute OT to increase overall independence in the areas of ADLs, functional mobility,and safety awareness in order to safely discharge to next venue of care.     Recommendations for follow up therapy are one component of a multi-disciplinary discharge planning process, led by the attending physician.  Recommendations may be updated based on patient status, additional functional criteria and insurance authorization.   Follow Up Recommendations  SNF;Supervision - Intermittent    Equipment Recommendations  None recommended by OT       Precautions / Restrictions Precautions Precautions: Fall Restrictions Weight Bearing Restrictions: No      Mobility Bed Mobility Overal bed mobility: Needs Assistance Bed Mobility: Supine to Sit;Sit to Supine     Supine to sit: Min assist;HOB elevated Sit to supine: Min assist;HOB elevated   General bed mobility comments: min  A for trunk support and min cuing for technique    Transfers Overall transfer level: Needs assistance Equipment used: 1 person hand held assist Transfers: Sit to/from [facility] Sit to Stand: Mod assist Stand pivot transfers: Mod assist       General transfer comment: Pt required the bed to be elevated and min A to come to standing    Balance Overall balance assessment: Needs assistance   Sitting balance-Leahy Scale: Good Sitting balance - Comments: sitting EOB   Standing balance support: Bilateral upper extremity supported;During functional activity Standing balance-Leahy Scale: Fair Standing balance comment: Min to mod lean on the RW for support                           ADL either performed or assessed with clinical judgement   ADL Overall ADL's : Needs assistance/impaired                     Lower Body Dressing: Minimal assistance;Sitting/lateral leans Lower Body Dressing Details (indicate cue type and reason): to don B socks Toilet Transfer: Moderate assistance;Ambulation Toilet Transfer Details (indicate cue type and reason): simulated                 Vision Patient Visual Report: No change from baseline              Pertinent Vitals/Pain Pain Assessment: No/denies pain     Hand Dominance Left   Extremity/Trunk Assessment Upper Extremity Assessment Upper Extremity Assessment: Generalized weakness   Lower Extremity Assessment Lower Extremity Assessment: Generalized weakness       Communication Communication Communication: No difficulties   Cognition Arousal/Alertness: Awake/alert  Behavior During Therapy: WFL for tasks assessed/performed Overall Cognitive Status: Within Functional Limits for tasks assessed                                 General Comments: Pt is pleasant and cooperative      Exercises Total Joint Exercises Ankle Circles/Pumps: AROM;Strengthening;Both;10 reps Quad Sets:  Strengthening;Both;10 reps Gluteal Sets: Strengthening;Both;10 reps Short Arc Quad: Strengthening;Both;10 reps Long Arc Quad: AROM;Strengthening;Both;10 reps Knee Flexion: 10 reps;Both;Strengthening;AROM Marching in Standing: AROM;Strengthening;Both;5 reps;Standing Other Exercises Other Exercises: HEP education for BLE APs, QS, GS, and LAQs x 10 each every 1-2 hours daily        Home Living Family/patient expects to be discharged to:: Private residence Living Arrangements: Alone Available Help at Discharge: Family;Available PRN/intermittently;Personal care attendant Type of Home: House Home Access: Ramped entrance     Home Layout: One level     Bathroom Shower/Tub: Occupational psychologist: Handicapped height     Home Equipment: Grab bars - toilet;Grab bars - tub/shower;Bedside commode;Walker - 2 wheels;Walker - 4 wheels;Cane - quad   Additional Comments: PCA 7 days/wk for around 1 hour/day that assists mostly with household chores      Prior Functioning/Environment Level of Independence: Independent with assistive device(s)        Comments: Mod Ind amb limited community distances with a QC or more recently a RW, several falls in the last year secondary to tripping and/or LE buckling, no falls in the last few months, Ind with ADLs        OT Problem List: Decreased strength;Decreased activity tolerance;Impaired balance (sitting and/or standing);Impaired vision/perception;Decreased safety awareness      OT Treatment/Interventions: Self-care/ADL training;Therapeutic exercise;Therapeutic activities;Energy conservation;Patient/family education;Balance training;Manual therapy    OT Goals(Current goals can be found in the care plan section) Acute Rehab OT Goals Patient Stated Goal: To get stronger and go home OT Goal Formulation: With patient/family Time For Goal Achievement: 07/05/21 Potential to Achieve Goals: Good ADL Goals Pt Will Perform Grooming: with  modified independence;standing Pt Will Perform Lower Body Dressing: with supervision;sit to/from stand Pt Will Transfer to Toilet: with supervision;ambulating Pt Will Perform Toileting - Clothing Manipulation and hygiene: with supervision;sit to/from stand  OT Frequency: Min 2X/week   Barriers to D/C: Decreased caregiver support             AM-PAC OT "6 Clicks" Daily Activity     Outcome Measure Help from another person eating meals?: None Help from another person taking care of personal grooming?: A Little Help from another person toileting, which includes using toliet, bedpan, or urinal?: A Lot Help from another person bathing (including washing, rinsing, drying)?: A Lot Help from another person to put on and taking off regular upper body clothing?: A Little Help from another person to put on and taking off regular lower body clothing?: A Lot 6 Click Score: 16   End of Session Equipment Utilized During Treatment: Rolling walker Nurse Communication: Mobility status  Activity Tolerance: Patient tolerated treatment well Patient left: in bed;with call bell/phone within reach;with bed alarm set  OT Visit Diagnosis: Unsteadiness on feet (R26.81);Repeated falls (R29.6);Muscle weakness (generalized) (M62.81)                Time: 2694-8546 OT Time Calculation (min): 30 min Charges:  OT General Charges $OT Visit: 1 Visit OT Evaluation $OT Eval Moderate Complexity: 1 Mod OT Treatments $Self Care/Home Management : 8-22 mins  Darleen Crocker, MS, OTR/L , CBIS ascom [phone]  06/21/21, 1:41 PM

## 2021-06-21 NOTE — Evaluation (Signed)
Physical Therapy Evaluation Patient Details Name: Suzanne Dixon MRN: [id number] DOB: 09-14-1951 Today's Date: 06/21/2021  History of Present Illness  Pt is a 70 y.o. female with hx of A. fib, breast cancer, CVA, polyneuropathy, Parkinson's Dz, and lymphocytic leukemia who presented with altered mental status. MD assessment includes: AMS, delirium, acute metabolic encephalopathy, [id number] infection, lactic acidosis, elevated troponin, and LE edema.   Clinical Impression  Pt was pleasant and motivated to participate during the session but ultimately presented with poor activity tolerance.  Pt was able to come to sitting without help with extra time and effort and the use of the bed rail.  Pt required the bed to be elevated along with min A to come to standing and was only able to amb a max of 3 feet before needing to return to sitting secondary to fatigue.  Pt's SpO2 and HR were both WNL on room air during the session. Pt will benefit from PT services in a SNF setting upon discharge to safely address deficits listed in patient problem list for decreased caregiver assistance and eventual return to PLOF.         Recommendations for follow up therapy are one component of a multi-disciplinary discharge planning process, led by the attending physician.  Recommendations may be updated based on patient status, additional functional criteria and insurance authorization.  Follow Up Recommendations SNF;Supervision for mobility/OOB    Equipment Recommendations  None recommended by PT    Recommendations for Other Services       Precautions / Restrictions Precautions Precautions: Fall Restrictions Weight Bearing Restrictions: No      Mobility  Bed Mobility Overal bed mobility: Modified Independent             General bed mobility comments: Extra time and effort with sup to sit along with use of the bed rail    Transfers Overall transfer level: Needs assistance Equipment used: Rolling  walker (2 wheeled) Transfers: Sit to/from Stand Sit to Stand: Min assist;From elevated surface         General transfer comment: Pt required the bed to be elevated and min A to come to standing  Ambulation/Gait Ambulation/Gait assistance: Min guard Gait Distance (Feet): 3 Feet Assistive device: Rolling walker (2 wheeled) Gait Pattern/deviations: Step-through pattern;Decreased step length - right;Decreased step length - left Gait velocity: decreased   General Gait Details: Pt was only able to amb a max of several steps at the EOB and to the chair before fatiguing and needing to return to sitting; SpO2 in the upper 90s on room air throughout with HR WNL  Stairs            Wheelchair Mobility    Modified Rankin (Stroke Patients Only)       Balance Overall balance assessment: Needs assistance   Sitting balance-Leahy Scale: Good     Standing balance support: Bilateral upper extremity supported;During functional activity Standing balance-Leahy Scale: Fair Standing balance comment: Min to mod lean on the RW for support                             Pertinent Vitals/Pain Pain Assessment: No/denies pain    Home Living Family/patient expects to be discharged to:: Private residence Living Arrangements: Alone Available Help at Discharge: Family;Available PRN/intermittently;Personal care attendant Type of Home: House Home Access: Ramped entrance     Home Layout: One level Home Equipment: Grab bars - toilet;Grab bars - tub/shower;Bedside commode;Walker - 2  wheels;Walker - 4 wheels;Cane - quad Additional Comments: PCA 7 days/wk for around 1 hour/day that assists mostly with household chores    Prior Function Level of Independence: Independent with assistive device(s)         Comments: Mod Ind amb limited community distances with a QC or more recently a RW, several falls in the last year secondary to tripping and/or LE buckling, no falls in the last few  months, Ind with ADLs     Hand Dominance   Dominant Hand: Left    Extremity/Trunk Assessment   Upper Extremity Assessment Upper Extremity Assessment: Generalized weakness    Lower Extremity Assessment Lower Extremity Assessment: Generalized weakness       Communication   Communication: No difficulties  Cognition Arousal/Alertness: Awake/alert Behavior During Therapy: WFL for tasks assessed/performed Overall Cognitive Status: Within Functional Limits for tasks assessed                                        General Comments      Exercises Total Joint Exercises Ankle Circles/Pumps: AROM;Strengthening;Both;10 reps Quad Sets: Strengthening;Both;10 reps Gluteal Sets: Strengthening;Both;10 reps Short Arc Quad: Strengthening;Both;10 reps Long Arc Quad: AROM;Strengthening;Both;10 reps Knee Flexion: 10 reps;Both;Strengthening;AROM Marching in Standing: AROM;Strengthening;Both;5 reps;Standing Other Exercises Other Exercises: HEP education for BLE APs, QS, GS, and LAQs x 10 each every 1-2 hours daily   Assessment/Plan    PT Assessment Patient needs continued PT services  PT Problem List Decreased strength;Decreased activity tolerance;Decreased balance;Decreased mobility;Decreased knowledge of use of DME       PT Treatment Interventions DME instruction;Gait training;Functional mobility training;Therapeutic activities;Therapeutic exercise;Balance training;Patient/family education    PT Goals (Current goals can be found in the Care Plan section)  Acute Rehab PT Goals Patient Stated Goal: To get stronger PT Goal Formulation: With patient Time For Goal Achievement: 07/04/21 Potential to Achieve Goals: Good    Frequency Min 2X/week   Barriers to discharge Inaccessible home environment;Decreased caregiver support      Co-evaluation               AM-PAC PT "6 Clicks" Mobility  Outcome Measure Help needed turning from your back to your side while  in a flat bed without using bedrails?: A Little Help needed moving from lying on your back to sitting on the side of a flat bed without using bedrails?: A Little Help needed moving to and from a bed to a chair (including a wheelchair)?: A Little Help needed standing up from a chair using your arms (e.g., wheelchair or bedside chair)?: A Little Help needed to walk in hospital room?: A Lot Help needed climbing 3-5 steps with a railing? : Total 6 Click Score: 15    End of Session Equipment Utilized During Treatment: Gait belt Activity Tolerance: Patient tolerated treatment well Patient left: in chair;with call bell/phone within reach;with chair alarm set Nurse Communication: Mobility status PT Visit Diagnosis: History of falling (Z91.81);Difficulty in walking, not elsewhere classified (R26.2);Muscle weakness (generalized) (M62.81)    Time: 1010-1104 PT Time Calculation (min) (ACUTE ONLY): 54 min   Charges:   PT Evaluation $PT Eval Moderate Complexity: 1 Mod PT Treatments $Therapeutic Exercise: 8-22 mins $Therapeutic Activity: 8-22 mins       D. Royetta Asal PT, DPT 06/21/21, 12:18 PM

## 2021-06-21 NOTE — Consult Note (Signed)
[place] for warfarin with Lovenox bridging  Indication: atrial fibrillation  Allergies  Allergen Reactions   Advair Diskus [Fluticasone-Salmeterol] Other (See Comments)    Joint pain    Alendronate     Muscle pain   Singulair [Montelukast] Other (See Comments)    "muscle pain"   Sulfa Antibiotics Hives   Venlafaxine Other (See Comments)    "altered mental status/tremors"    Patient Measurements: Height: 5\' 4"  (162.6 cm) Weight: 113.3 kg (249 lb 12.8 oz) IBW/kg (Calculated) : 54.7  Vital Signs: Temp: 97.5 F (36.4 C) (10/03 0407) Temp Source: Oral (10/03 0407) BP: 112/66 (10/03 0407) Pulse Rate: 64 (10/03 0407)  Labs: Recent Labs    06/19/21 1145 06/19/21 1354 06/19/21 1730 06/19/21 1943 06/20/21 0446 06/21/21 0437  HGB 14.2  --   --   --  12.4 12.5  HCT 41.1  --   --   --  36.7 36.9  PLT 324  --   --   --  277 259  LABPROT  --  17.2*  --   --  16.7* 19.6*  INR  --  1.4*  --   --  1.4* 1.7*  CREATININE 0.98  --   --   --  0.75 0.65  TROPONINIHS 100* 93* 106* 119*  --   --      Estimated Creatinine Clearance: 78.4 mL/min (by C-G formula based on SCr of 0.65 mg/dL).   Medical History: Past Medical History:  Diagnosis Date   A-fib ([place])    Anemia    B12 deficiency    Breast cancer ([place]) 03/31/2013   Right - chemo- mastectomy   Breast cancer ([place]) 1991   Rt.- radiation   Complication of anesthesia 01/2009   Recent years with general anesthesia had itching following surgery   DDD (degenerative disc disease), cervical    Depression    Diverticulitis    Dyspnea    Edema of both legs    GERD (gastroesophageal reflux disease)    Gout    H/O mastectomy 2014   per patient   H/O: cesarean section 1987   per patient report   H/O: hysterectomy 1996   per patient   Hydradenitis    Hypertension    Hypothyroidism    Neuropathy    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 female with hx of A. fib, breast cancer, CVA, polyneuropathy, lymphocytic leukemia, presents with altered mental status and found to be COVID positive. Pharmacy has been consulted for warfarin dosing for A. Fib with Lovenox bridge for subtherapeutic INR on admission.  Baseline labs: INR 1.4; H/H and plts WNL; D-dimer 1.02  DDIs:  Warfarin + levothyroxine >> may increase INR Warfarin + meloxicam >> may increase risk of bleeding.  Date INR Dose 9/30 -- 7 mg  10/1 1.4 10 mg 10/2 1.4 7.5 mg 10/3 1.7 7.5 mg  Goal of Therapy:  INR 2-3 Monitor platelets by anticoagulation protocol: Yes   Plan:  INR is subtherapeutic. Will give warfarin 7.5 mg x 1 tonight (~ 10%  increase from home dose). Daily INR ordered. CBC at least every 3 days. Discontinue enoxaparin bridge once INR > 2.   Sherilyn Banker, PharmD Clinical Pharmacist 06/21/2021,7:42 AM

## 2021-06-22 DIAGNOSIS — R600 Localized edema: Secondary | ICD-10-CM | POA: Diagnosis not present

## 2021-06-22 DIAGNOSIS — C50411 Malignant neoplasm of upper-outer quadrant of right female breast: Secondary | ICD-10-CM | POA: Diagnosis not present

## 2021-06-22 DIAGNOSIS — I480 Paroxysmal atrial fibrillation: Secondary | ICD-10-CM | POA: Diagnosis not present

## 2021-06-22 DIAGNOSIS — U071 COVID-19: Secondary | ICD-10-CM | POA: Diagnosis not present

## 2021-06-22 LAB — COMPREHENSIVE METABOLIC PANEL
ALT: <5 U/L (ref 0–44)
AST: 39 U/L (ref 15–41)
Albumin: 2.8 g/dL — ABNORMAL LOW (ref 3.5–5.0)
Alkaline Phosphatase: 62 U/L (ref 38–126)
Anion gap: 5 (ref 5–15)
BUN: 15 mg/dL (ref 8–23)
CO2: 27 mmol/L (ref 22–32)
Calcium: 8 mg/dL — ABNORMAL LOW (ref 8.9–10.3)
Chloride: 106 mmol/L (ref 98–111)
Creatinine, Ser: 0.69 mg/dL (ref 0.44–1.00)
GFR, Estimated: >60 mL/min (ref >60)
Glucose, Bld: 87 mg/dL (ref 70–99)
Potassium: 3.7 mmol/L (ref 3.5–5.1)
Sodium: 138 mmol/L (ref 135–145)
Total Bilirubin: 0.5 mg/dL (ref 0.3–1.2)
Total Protein: 5.7 g/dL — ABNORMAL LOW (ref 6.5–8.1)

## 2021-06-22 LAB — URINE CULTURE: Culture: >=100000 — AB

## 2021-06-22 LAB — CBC WITH DIFFERENTIAL/PLATELET
Abs Immature Granulocytes: 0.01 10*3/uL (ref 0.00–0.07)
Basophils Absolute: 0 10*3/uL (ref 0.0–0.1)
Basophils Relative: 1 %
Eosinophils Absolute: 0.1 10*3/uL (ref 0.0–0.5)
Eosinophils Relative: 2 %
HCT: 37.1 % (ref 36.0–46.0)
Hemoglobin: 12.9 g/dL (ref 12.0–15.0)
Immature Granulocytes: 0 %
Lymphocytes Relative: 50 %
Lymphs Abs: 3 10*3/uL (ref 0.7–4.0)
MCH: 32.5 pg (ref 26.0–34.0)
MCHC: 34.8 g/dL (ref 30.0–36.0)
MCV: 93.5 fL (ref 80.0–100.0)
Monocytes Absolute: 0.9 10*3/uL (ref 0.1–1.0)
Monocytes Relative: 15 %
Neutro Abs: 1.9 10*3/uL (ref 1.7–7.7)
Neutrophils Relative %: 32 %
Platelets: 225 10*3/uL (ref 150–400)
RBC: 3.97 MIL/uL (ref 3.87–5.11)
RDW: 14 % (ref 11.5–15.5)
WBC: 5.8 10*3/uL (ref 4.0–10.5)
nRBC: 0 % (ref 0.0–0.2)

## 2021-06-22 LAB — D-DIMER, QUANTITATIVE: D-Dimer, Quant: 0.53 ug/mL-FEU — ABNORMAL HIGH (ref 0.00–0.50)

## 2021-06-22 LAB — FERRITIN: Ferritin: 102 ng/mL (ref 11–307)

## 2021-06-22 LAB — C-REACTIVE PROTEIN: CRP: 0.6 mg/dL (ref <1.0)

## 2021-06-22 LAB — MAGNESIUM: Magnesium: 1.6 mg/dL — ABNORMAL LOW (ref 1.7–2.4)

## 2021-06-22 LAB — PHOSPHORUS: Phosphorus: 2.8 mg/dL (ref 2.5–4.6)

## 2021-06-22 LAB — PROTIME-INR
INR: 1.7 — ABNORMAL HIGH (ref 0.8–1.2)
Prothrombin Time: 20.2 seconds — ABNORMAL HIGH (ref 11.4–15.2)

## 2021-06-22 MED ORDER — MAGNESIUM SULFATE 2 GM/50ML IV SOLN
2.0000 g | Freq: Once | INTRAVENOUS | Status: AC
  Administered 2021-06-22: 2 g via INTRAVENOUS
  Filled 2021-06-22: qty 50

## 2021-06-22 MED ORDER — FUROSEMIDE 20 MG PO TABS
20.0000 mg | ORAL_TABLET | Freq: Every day | ORAL | Status: DC
  Administered 2021-06-23 – 2021-06-26 (×3): 20 mg via ORAL
  Filled 2021-06-22 (×4): qty 1

## 2021-06-22 MED ORDER — RISAQUAD PO CAPS
2.0000 | ORAL_CAPSULE | Freq: Three times a day (TID) | ORAL | Status: DC
  Administered 2021-06-22 – 2021-06-29 (×23): 2 via ORAL
  Filled 2021-06-22 (×23): qty 2

## 2021-06-22 MED ORDER — WARFARIN SODIUM 10 MG PO TABS
10.0000 mg | ORAL_TABLET | Freq: Once | ORAL | Status: AC
  Administered 2021-06-22: 10 mg via ORAL
  Filled 2021-06-22: qty 1

## 2021-06-22 MED ORDER — CEPHALEXIN 500 MG PO CAPS
500.0000 mg | ORAL_CAPSULE | Freq: Three times a day (TID) | ORAL | Status: AC
  Administered 2021-06-22 – 2021-06-25 (×9): 500 mg via ORAL
  Filled 2021-06-22 (×9): qty 1

## 2021-06-22 NOTE — TOC Progression Note (Addendum)
Transition of Care [facility]) - Progression Note    Patient Details  Name: Suzanne Dixon MRN: [id number] Date of Birth: 1951/04/22  Transition of Care [facility]) CM/SW [place], LCSW Phone Number: 06/22/2021, 10:28 AM  Clinical Narrative:   Called daughter Estill Bamberg. She said she tried talking to patient and her sister yesterday about SNF. Patient thought she would have to pay out of pocket to go to rehab and that the hospital would keep her here to complete her rehab. Daughter works in [employer] and said she tried explaining to them that this is not the case. Will explain to patient as well. Tried calling in the room twice but line is busy. Will try again later.  12:22 pm: Called patient and discussed SNF again. She is now agreeable. Both daughters are aware. Will have to stay here until COVID quarantine complete. PASARR under manual review.  Expected Discharge Plan: [place] Barriers to Discharge: Continued Medical Work up  Expected Discharge Plan and Services Expected Discharge Plan: [place] Choice: Resumption of Svcs/PTA Provider Living arrangements for the past 2 months: Single Family Home                           HH Arranged: RN, PT, OT, Nurse's Aide, Social Work [employer] Agency: [place] Date [place]: 06/21/21   Representative spoke with at [place]: [place] Pack   Social Determinants of Health ([place]) Interventions    Readmission Risk Interventions Readmission Risk Prevention Plan 06/21/2021  Transportation Screening Complete  PCP or Specialist Appt within 3-5 Days Complete  HRI or [place] Complete  Social Work Consult for [place] Planning/Counseling Complete  Palliative Care Screening Not Applicable  Medication Review Press photographer) Complete  Some recent data might be hidden

## 2021-06-22 NOTE — NC FL2 (Signed)
[place] LEVEL OF CARE SCREENING TOOL     IDENTIFICATION  Patient Name: Suzanne Dixon Birthdate: [birth year] Sex: female Admission Date (Current Location): 06/19/2021  [facility] and [place] Number:  Engineering geologist and Address:  [facility], [address] [place], [place] [address]      Provider Number: [id number]  Attending Physician Name and Address:  Nolberto Hanlon, MD  Relative Name and Phone Number:       Current Level of Care: Hospital Recommended Level of Care: [place] Prior Approval Number:    Date Approved/Denied:   PASRR Number: Manual review  Discharge Plan: SNF    Current Diagnoses: Patient Active Problem List   Diagnosis Date Noted   COVID-19 virus infection 06/19/2021   CVA (cerebral vascular accident) ([place]) 02/01/2021   Cellulitis 02/01/2021   Hypothyroidism    Left leg cellulitis    Acquired thrombophilia ([place]) 12/10/2020   Status post reverse total shoulder replacement, 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   Lymphedema 07/17/2020   Right rotator cuff tear arthropathy 04/07/2020   Edema of both legs 12/31/2019   Polyneuropathy associated with underlying disease ([place]) 12/31/2019   Swelling of limb 12/31/2019   Hematuria, microscopic 06/07/2018   Age-related osteoporosis without current pathological fracture 12/08/2017   Rotator cuff arthropathy, right 01/03/2017   Biceps tendinitis of right upper extremity 12/05/2016   Complete tear of right rotator cuff 12/05/2016   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   History of breast cancer in female 06/02/2016   Allergic state 04/01/2015   Arthritis 04/01/2015   Cancer ([place]) 04/01/2015   Edema leg 04/01/2015   Gout 04/01/2015   Hidradenitis 04/01/2015   Elevated lymphocyte count 04/01/2015   Disorder of peripheral nervous system 04/01/2015   Apnea, sleep 04/01/2015   Avitaminosis D 04/01/2015    Breast cancer ([place]) 04/01/2015   Hypertension 04/15/2014   B12 deficiency 02/24/2014   AF (paroxysmal atrial fibrillation) ([place]) 02/17/2014   Adiposity 06/04/2012   Depression, major, recurrent, moderate ([place]) 03/26/2012    Orientation RESPIRATION BLADDER Height & Weight     Self, Time, Situation, Place  Normal Incontinent, External catheter Weight: 249 lb 12.8 oz (113.3 kg) Height:  5\' 4"  (162.6 cm)  BEHAVIORAL SYMPTOMS/MOOD NEUROLOGICAL BOWEL NUTRITION STATUS   (None)  (None) Continent Diet (Heart healthy)  AMBULATORY STATUS COMMUNICATION OF NEEDS Skin   Limited Assist Verbally Normal                       Personal Care Assistance Level of Assistance  Bathing, Feeding, Dressing Bathing Assistance: Limited assistance Feeding assistance: Limited assistance Dressing Assistance: Limited assistance     Functional Limitations Info  Sight, Hearing, Speech Sight Info: Adequate Hearing Info: Adequate Speech Info: Adequate    SPECIAL CARE FACTORS FREQUENCY  PT (By licensed PT), OT (By licensed OT)     PT Frequency: 5 x week OT Frequency: 5 x week            Contractures Contractures Info: Not present    Additional Factors Info  Code Status, Allergies, Psychotropic Code Status Info: Full code Allergies Info: Advair Diskus (fluticasone-salmeterol), Alendronate, Singulair (Montelukast), Sulfa Antibiotics, Venlafaxine Psychotropic Info: Depression         Current Medications (06/22/2021):  This is the current hospital active medication list Current Facility-Administered Medications  Medication Dose Route Frequency Provider Last Rate Last Admin   acetaminophen (TYLENOL) tablet 650 mg  650 mg Oral Q6H PRN Clarnce Flock, MD       acidophilus (RISAQUAD) capsule 2 capsule  2 capsule Oral TID Nolberto Hanlon, MD   2 capsule at 06/22/21 1156   allopurinol (ZYLOPRIM) tablet 300 mg  300 mg Oral Daily Clarnce Flock, MD   300 mg at 06/22/21 2725   carbidopa-levodopa  (SINEMET IR) 25-100 MG per tablet immediate release 1.5 tablet  1.5 tablet Oral TID Clarnce Flock, MD   1.5 tablet at 06/22/21 3664   cephALEXin (KEFLEX) capsule 500 mg  500 mg Oral Q8H Amery, Gwynneth Albright, MD       chlorpheniramine-HYDROcodone (TUSSIONEX) 10-8 MG/5ML suspension 5 mL  5 mL Oral Q12H PRN Clarnce Flock, MD       DULoxetine (CYMBALTA) DR capsule 60 mg  60 mg Oral BID Clarnce Flock, MD   60 mg at 06/22/21 4034   enoxaparin (LOVENOX) injection 114 mg  1 mg/kg Subcutaneous Q12H Clarnce Flock, MD   114 mg at 06/22/21 0919   furosemide (LASIX) tablet 40 mg  40 mg Oral Daily Clarnce Flock, MD   40 mg at 06/22/21 7425   gabapentin (NEURONTIN) tablet 600 mg  600 mg Oral QID Clarnce Flock, MD   600 mg at 06/22/21 9563   guaiFENesin-dextromethorphan (ROBITUSSIN DM) 100-10 MG/5ML syrup 10 mL  10 mL Oral Q4H PRN Clarnce Flock, MD       levothyroxine (SYNTHROID) tablet 50 mcg  50 mcg Oral Q0600 Clarnce Flock, MD   50 mcg at 06/22/21 0559   magnesium sulfate IVPB 2 g 50 mL  2 g Intravenous Once Nolberto Hanlon, MD 50 mL/hr at 06/22/21 1157 2 g at 06/22/21 1157   meloxicam (MOBIC) tablet 15 mg  15 mg Oral Daily Clarnce Flock, MD   15 mg at 06/22/21 8756   metoprolol tartrate (LOPRESSOR) tablet 200 mg  200 mg Oral BID Clarnce Flock, MD   200 mg at 06/22/21 0923   ondansetron (ZOFRAN) tablet 4 mg  4 mg Oral Q6H PRN Clarnce Flock, MD       Or   ondansetron [facility]) injection 4 mg  4 mg Intravenous Q6H PRN Clarnce Flock, MD       oxybutynin (DITROPAN) tablet 5 mg  5 mg Oral BID Clarnce Flock, MD   5 mg at 06/22/21 4332   oxyCODONE (Oxy IR/ROXICODONE) immediate release tablet 5 mg  5 mg Oral Q4H PRN Clarnce Flock, MD       pantoprazole (PROTONIX) EC tablet 40 mg  40 mg Oral Daily Clarnce Flock, MD   40 mg at 06/22/21 9518   polyethylene glycol (MIRALAX / GLYCOLAX) packet 17 g  17 g Oral Daily PRN Clarnce Flock, MD       potassium  chloride (KLOR-CON) CR tablet 10 mEq  10 mEq Oral TID Clarnce Flock, MD   10 mEq at 06/22/21 8416   remdesivir 100 mg in sodium chloride 0.9 % 100 mL IVPB  100 mg Intravenous Daily Clarnce Flock, MD 200 mL/hr at 06/22/21 0919 100 mg at 06/22/21 0919   sodium chloride flush (NS) 0.9 % injection 3 mL  3 mL Intravenous Q12H Clarnce Flock, MD   3 mL at 06/22/21 0920   traZODone (DESYREL) tablet 50 mg  50 mg Oral QHS PRN Clarnce Flock, MD       vitamin B-12 (CYANOCOBALAMIN) tablet 1,000 mcg  1,000  mcg Oral Daily Clarnce Flock, MD   1,000 mcg at 06/22/21 3094   warfarin (COUMADIN) tablet 10 mg  10 mg Oral ONCE-1600 Oswald Hillock, [facility]       Warfarin - Pharmacist Dosing Inpatient   Does not apply [id number] Darnelle Bos [facility]   Given at 06/21/21 1713     Discharge Medications: Please see discharge summary for a list of discharge medications.  Relevant Imaging Results:  Relevant Lab Results:   Additional Information SS#: [id number]. Says she has had two COVID vaccines and one booster. Tested positive for COVID on 10/1.  Candie Chroman, LCSW

## 2021-06-22 NOTE — Progress Notes (Signed)
Occupational Therapy Treatment Patient Details Name: Suzanne Dixon MRN: [id number] DOB: Sep 24, 1950 Today's Date: 06/22/2021   History of present illness Pt is a 70 y.o. female with hx of A. fib, breast cancer, CVA, polyneuropathy, Parkinson's Dz, and lymphocytic leukemia who presented with altered mental status. MD assessment includes: AMS, delirium, acute metabolic encephalopathy, [id number] infection, lactic acidosis, elevated troponin, and LE edema.   OT comments  Pt. was independently able to complete self-grooming with set-up while sitting up in the chair, and apply gown. Assist was required to fasten the gown. Pt. Education was provided about energy conservation, work simplification techniques, and A/E for LE ADLs. Pt. reports new onset ( approximately 3 weeks ago) of numbness in her bilateral hands. Pt. Presents with limited activity tolerance, and fatigues with activity. Pt. Continues to benefit from OT services for ADL training, A/E training, and pt. Education about energy conservation/work simplification techniques, home modification, and DME.    Recommendations for follow up therapy are one component of a multi-disciplinary discharge planning process, led by the attending physician.  Recommendations may be updated based on patient status, additional functional criteria and insurance authorization.    Follow Up Recommendations  SNF;Supervision - Intermittent    Equipment Recommendations  None recommended by OT    Recommendations for Other Services      Precautions / Restrictions Precautions Precautions: Fall Restrictions Weight Bearing Restrictions: No       Mobility Bed Mobility Overal bed mobility: Needs Assistance         Sit to supine: HOB elevated;Min assist        Transfers     Transfers: Sit to/from Stand;Stand Pivot Transfers Sit to Stand: Min assist Stand pivot transfers: Min assist       General transfer comment: Min guard to walk from the chair to  the bed.    Balance                                           ADL either performed or assessed with clinical judgement   ADL Overall ADL's : Needs assistance/impaired                     Lower Body Dressing: Minimal assistance;               Functional mobility during ADLs: Min guard       Vision Patient Visual Report: No change from baseline     Perception     Praxis      Cognition Arousal/Alertness: Awake/alert Behavior During Therapy: WFL for tasks assessed/performed Overall Cognitive Status: Within Functional Limits for tasks assessed                                 General Comments: Pt is pleasant and cooperative        Exercises     Shoulder Instructions       General Comments      Pertinent Vitals/ Pain       Pain Assessment: No/denies pain  Home Living                                          Prior Functioning/Environment  Frequency  Min 2X/week        Progress Toward Goals  OT Goals(current goals can now be found in the care plan section)  Progress towards OT goals: Progressing toward goals  Acute Rehab OT Goals Patient Stated Goal: To get stronger and go home OT Goal Formulation: With patient/family Potential to Achieve Goals: Good  Plan      Co-evaluation                 AM-PAC OT "6 Clicks" Daily Activity     Outcome Measure   Help from another person eating meals?: None Help from another person taking care of personal grooming?: A Little Help from another person toileting, which includes using toliet, bedpan, or urinal?: A Lot Help from another person bathing (including washing, rinsing, drying)?: A Lot Help from another person to put on and taking off regular upper body clothing?: A Little Help from another person to put on and taking off regular lower body clothing?: Total 6 Click Score: 15    End of Session Equipment Utilized During  Treatment: Rolling walker  OT Visit Diagnosis: Unsteadiness on feet (R26.81);Repeated falls (R29.6);Muscle weakness (generalized) (M62.81)   Activity Tolerance Patient tolerated treatment well   Patient Left in bed;with call bell/phone within reach;with bed alarm set   Nurse Communication Mobility status        Time: 1525-1550 OT Time Calculation (min): 25 min  Charges: OT General Charges $OT Visit: 1 Visit OT Treatments $Self Care/Home Management : 23-37 mins  Harrel Carina, MS, OTR/L   Harrel Carina 06/22/2021, 4:47 PM

## 2021-06-22 NOTE — Consult Note (Signed)
[place] for warfarin with Lovenox bridging  Indication: atrial fibrillation  Allergies  Allergen Reactions   Advair Diskus [Fluticasone-Salmeterol] Other (See Comments)    Joint pain    Alendronate     Muscle pain   Singulair [Montelukast] Other (See Comments)    "muscle pain"   Sulfa Antibiotics Hives   Venlafaxine Other (See Comments)    "altered mental status/tremors"    Patient Measurements: Height: 5\' 4"  (162.6 cm) Weight: 113.3 kg (249 lb 12.8 oz) IBW/kg (Calculated) : 54.7  Vital Signs: Temp: 97.5 F (36.4 C) (10/04 0525) Temp Source: Oral (10/04 0525) BP: 126/81 (10/04 0525) Pulse Rate: 59 (10/04 0525)  Labs: Recent Labs    06/19/21 1354 06/19/21 1730 06/19/21 1943 06/20/21 0446 06/21/21 0437 06/22/21 0429  HGB  --   --   --  12.4 12.5 12.9  HCT  --   --   --  36.7 36.9 37.1  PLT  --   --   --  277 259 225  LABPROT 17.2*  --   --  16.7* 19.6* 20.2*  INR 1.4*  --   --  1.4* 1.7* 1.7*  CREATININE  --   --   --  0.75 0.65 0.69  TROPONINIHS 93* 106* 119*  --   --   --      Estimated Creatinine Clearance: 78.4 mL/min (by C-G formula based on SCr of 0.69 mg/dL).   Medical History: Past Medical History:  Diagnosis Date   A-fib ([place])    Anemia    B12 deficiency    Breast cancer ([place]) 03/31/2013   Right - chemo- mastectomy   Breast cancer ([place]) 1991   Rt.- radiation   Complication of anesthesia 01/2009   Recent years with general anesthesia had itching following surgery   DDD (degenerative disc disease), cervical    Depression    Diverticulitis    Dyspnea    Edema of both legs    GERD (gastroesophageal reflux disease)    Gout    H/O mastectomy 2014   per patient   H/O: cesarean section 1987   per patient report   H/O: hysterectomy 1996   per patient   Hydradenitis    Hypertension    Hypothyroidism    Neuropathy    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 female with hx of A. fib, breast cancer, CVA, polyneuropathy, lymphocytic leukemia, presents with altered mental status and found to be COVID positive. Pharmacy has been consulted for warfarin dosing for A. Fib with Lovenox bridge for subtherapeutic INR on admission.  Baseline labs: INR 1.4; H/H and plts WNL; D-dimer 1.02  DDIs:  Warfarin + levothyroxine >> may increase INR Warfarin + meloxicam >> may increase risk of bleeding.  Date INR Dose 9/30 -- 7 mg  10/1 1.4 10 mg 10/2 1.4 7.5 mg 10/3 1.7 7.5 mg 10/4 1.7 10 mg  Goal of Therapy:  INR 2-3 Monitor platelets by anticoagulation protocol: Yes   Plan:  INR is subtherapeutic. Will give warfarin 10 mg x 1 tonight (~  40% increase from home dose). Daily INR ordered. CBC at least every 3 days. Discontinue enoxaparin bridge once INR > 2.   Oswald Hillock, PharmD Clinical Pharmacist 06/22/2021,7:56 AM

## 2021-06-22 NOTE — Progress Notes (Signed)
  Chaplain On-Call responded to Order Requisition for Advance Directives information for the patient.  Due to the patient's COVID+ status, Chaplain asked Unit Secretary Inez Catalina to have the patient's Nurse deliver the documents to the patient.  Chaplains are available for telephone consultation as needed.  Chaplain Pollyann Samples M.Div., [facility]

## 2021-06-22 NOTE — TOC CM/SW Note (Cosign Needed)
RE: Suzanne Dixon Date of Birth: 1950-12-13 Date: 06/22/2021   To Whom It May Concern:  Please be advised that the above-named patient will require a short-term nursing home stay - anticipated 30 days or less for rehabilitation and strengthening.  The plan is for return home.

## 2021-06-22 NOTE — Progress Notes (Addendum)
PROGRESS NOTE    Suzanne Dixon  UUV:[id number] DOB: 25-May-1951 DOA: 06/19/2021 PCP: Adin Hector, MD    Brief Narrative:   Suzanne Dixon is a 70 y.o. female with hx of A. fib, breast cancer, CVA, polyneuropathy, lymphocytic leukemia, presents with altered mental status.She reports she had COVID 2 months prior.  She endorses body aches and chills.  Denies any other symptoms.  She is unsure where she is.COVID test returned positive.  UA showed extremely concentrated urine and elevated pH.  Blood cultures were obtained.  CT chest abdomen and pelvis was obtained which showed no PE in the chest and no acute findings in the abdomen.  CT head without contrast was also unremarkable.  She was given a 1 L normal saline bolus, started on broad-spectrum antibiotics, and admitted for further management.  10/4 MS continues to be stable and at baseline.  10/3 no complaints of sob, cp, n/v this am. Eating breakfast  Consultants:    Procedures:  CT a/p 1. Technically adequate exam showing no acute pulmonary embolus. 2.  Aortic atherosclerosis.  (ICD10-I70.0) 3. Cholecystectomy. 4. Bilateral renal cysts.  No acute urinary tract abnormality. 5. Postoperative changes in the small bowel. Uncomplicated Roux-en-Y anastomosis. 6. Prior hysterectomy. 7. Prior RIGHT mastectomy. Antimicrobials:  Remdesivir   Subjective: No complaints. No sob, cp, dizziness  Objective: Vitals:   06/21/21 0914 06/21/21 1558 06/21/21 2211 06/22/21 0525  BP: 124/76 94/61 106/63 126/81  Pulse: 68 70 66 (!) 59  Resp: 18 18 20 18   Temp: 98.1 F (36.7 C) 98.2 F (36.8 C) 97.8 F (36.6 C) (!) 97.5 F (36.4 C)  TempSrc: Oral Oral Oral Oral  SpO2: 97% 99% 99% 96%  Weight:      Height:        Intake/Output Summary (Last 24 hours) at 06/22/2021 0819 Last data filed at 06/22/2021 0030 Gross per 24 hour  Intake 220 ml  Output 850 ml  Net -630 ml   Filed Weights   06/19/21 1258  Weight: 113.3 kg     Examination: Nad, calm Cta no r/w Regular S1-S2 no gallops Soft benign positive bowel sounds No edema Aaxox3 Mood and affect appropriate in current setting    Data Reviewed: I have personally reviewed following labs and imaging studies  CBC: Recent Labs  Lab 06/19/21 1145 06/20/21 0446 06/21/21 0437 06/22/21 0429  WBC 8.5 7.3 6.1 5.8  NEUTROABS 4.2 3.1 2.1 1.9  HGB 14.2 12.4 12.5 12.9  HCT 41.1 36.7 36.9 37.1  MCV 91.3 92.2 92.0 93.5  PLT 324 277 259 474   Basic Metabolic Panel: Recent Labs  Lab 06/19/21 1145 06/20/21 0446 06/21/21 0437 06/22/21 0429  NA 141 140 138 138  K 3.9 3.5 3.6 3.7  CL 101 106 105 106  CO2 26 28 28 27   GLUCOSE 115* 93 88 87  BUN 11 9 13 15   CREATININE 0.98 0.75 0.65 0.69  CALCIUM 9.0 8.3* 8.2* 8.0*  MG  --  1.6* 1.8 1.6*  PHOS  --  3.9 3.7 2.8   GFR: Estimated Creatinine Clearance: 78.4 mL/min (by C-G formula based on SCr of 0.69 mg/dL). Liver Function Tests: Recent Labs  Lab 06/19/21 1145 06/20/21 0446 06/21/21 0437 06/22/21 0429  AST 37 24 25 39  ALT 17 <5 <5 <5  ALKPHOS 68 60 55 62  BILITOT 1.2 0.9 0.6 0.5  PROT 7.1 6.0* 5.8* 5.7*  ALBUMIN 3.5 2.9* 2.8* 2.8*   No results for input(s): LIPASE, AMYLASE  in the last 168 hours. No results for input(s): AMMONIA in the last 168 hours. Coagulation Profile: Recent Labs  Lab 06/19/21 1354 06/20/21 0446 06/21/21 0437 06/22/21 0429  INR 1.4* 1.4* 1.7* 1.7*   Cardiac Enzymes: No results for input(s): CKTOTAL, CKMB, CKMBINDEX, TROPONINI in the last 168 hours. BNP (last 3 results) No results for input(s): PROBNP in the last 8760 hours. HbA1C: No results for input(s): HGBA1C in the last 72 hours. CBG: No results for input(s): GLUCAP in the last 168 hours. Lipid Profile: No results for input(s): CHOL, HDL, LDLCALC, TRIG, CHOLHDL, LDLDIRECT in the last 72 hours. Thyroid Function Tests: No results for input(s): TSH, T4TOTAL, FREET4, T3FREE, THYROIDAB in the last 72  hours. Anemia Panel: Recent Labs    06/21/21 0437 06/22/21 0429  FERRITIN 115 102   Sepsis Labs: Recent Labs  Lab 06/19/21 1145 06/19/21 1354 06/19/21 1730  PROCALCITON <0.10  --   --   LATICACIDVEN 3.2* 2.0* 1.3    Recent Results (from the past 240 hour(s))  Blood culture (single)     Status: None (Preliminary result)   Collection Time: 06/19/21 11:45 AM   Specimen: BLOOD  Result Value Ref Range Status   Specimen Description   Final    BLOOD BLOOD LEFT HAND Performed at [facility], [address] [place], [place] [address]    Special Requests   Final    BOTTLES DRAWN AEROBIC AND ANAEROBIC Blood Culture adequate volume Performed at [facility], [address], [place], [place] [address]    Culture  Setup Time   Final    GRAM POSITIVE RODS IN BOTH AEROBIC AND ANAEROBIC BOTTLES CRITICAL RESULT CALLED TO, READ BACK BY AND VERIFIED WITH: MORGAN HICKS AT 1927 06/20/21.PMF    Culture   Final    GRAM POSITIVE RODS CULTURE REINCUBATED FOR BETTER GROWTH Performed at [facility], [place] [address] [place], [place] [address]    Report Status PENDING  Incomplete  Resp Panel by RT-PCR (Flu A&B, Covid) Nasopharyngeal Swab     Status: Abnormal   Collection Time: 06/19/21 12:00 PM   Specimen: Nasopharyngeal Swab; Nasopharyngeal(NP) swabs in vial transport medium  Result Value Ref Range Status   SARS Coronavirus 2 by RT PCR POSITIVE (A) NEGATIVE Final    Comment: RESULT CALLED TO, READ BACK BY AND VERIFIED WITH: C/RONNIE PFLUGER 06/19/21 1344 AMK (NOTE) SARS-CoV-2 target nucleic acids are DETECTED.  The SARS-CoV-2 RNA is generally detectable in upper respiratory specimens during the acute phase of infection. Positive results are indicative of the presence of the identified virus, but do not rule out bacterial infection or co-infection with other pathogens not detected by the test. Clinical correlation with patient history and other diagnostic  information is necessary to determine patient infection status. The expected result is Negative.  Fact Sheet for Patients: EntrepreneurPulse.com.au  Fact Sheet for Healthcare Providers: IncredibleEmployment.be  This test is not yet approved or cleared by the [place] FDA and  has been authorized for detection and/or diagnosis of SARS-CoV-2 by FDA under an Emergency Use Authorization (EUA).  This EUA will remain in effect (meaning this test can be  used) for the duration of  the COVID-19 declaration under Section 564(b)(1) of the Act, 21 U.S.C. section 360bbb-3(b)(1), unless the authorization is terminated or revoked sooner.     Influenza A by PCR NEGATIVE NEGATIVE Final   Influenza B by PCR NEGATIVE NEGATIVE Final    Comment: (NOTE) The Xpert Xpress SARS-CoV-2/FLU/RSV plus assay  is intended as an aid in the diagnosis of influenza from Nasopharyngeal swab specimens and should not be used as a sole basis for treatment. Nasal washings and aspirates are unacceptable for Xpert Xpress SARS-CoV-2/FLU/RSV testing.  Fact Sheet for Patients: EntrepreneurPulse.com.au  Fact Sheet for Healthcare Providers: IncredibleEmployment.be  This test is not yet approved or cleared by the [place] FDA and has been authorized for detection and/or diagnosis of SARS-CoV-2 by FDA under an Emergency Use Authorization (EUA). This EUA will remain in effect (meaning this test can be used) for the duration of the COVID-19 declaration under Section 564(b)(1) of the Act, 21 U.S.C. section 360bbb-3(b)(1), unless the authorization is terminated or revoked.  Performed at [facility], [address] [place], [place] [address]   Blood culture (single)     Status: None (Preliminary result)   Collection Time: 06/19/21  1:18 PM   Specimen: BLOOD  Result Value Ref Range Status   Specimen Description BLOOD LEFT ANTECUBITAL   Final   Special Requests   Final    BOTTLES DRAWN AEROBIC AND ANAEROBIC Blood Culture adequate volume   Culture   Final    NO GROWTH 3 DAYS Performed at [facility], [address], [place], [place] [address]    Report Status PENDING  Incomplete  Urine Culture     Status: Abnormal   Collection Time: 06/19/21  2:30 PM   Specimen: Urine, Clean Catch  Result Value Ref Range Status   Specimen Description   Final    URINE, CLEAN CATCH Performed at [facility], [address] [place], [place] [address]    Special Requests   Final    NONE Performed at [facility], [address] [place], [place] [address]    Culture >=100,000 COLONIES/mL CITROBACTER KOSERI (A)  Final   Report Status 06/22/2021 FINAL  Final   Organism ID, Bacteria CITROBACTER KOSERI (A)  Final      Susceptibility   Citrobacter koseri - MIC*    CEFAZOLIN <=4 SENSITIVE Sensitive     CEFEPIME <=0.12 SENSITIVE Sensitive     CEFTRIAXONE <=0.25 SENSITIVE Sensitive     CIPROFLOXACIN <=0.25 SENSITIVE Sensitive     GENTAMICIN <=1 SENSITIVE Sensitive     IMIPENEM <=0.25 SENSITIVE Sensitive     NITROFURANTOIN 32 SENSITIVE Sensitive     TRIMETH/SULFA <=20 SENSITIVE Sensitive     PIP/TAZO <=4 SENSITIVE Sensitive     * >=100,000 COLONIES/mL CITROBACTER KOSERI         Radiology Studies: ECHOCARDIOGRAM COMPLETE  Result Date: 06/21/2021    ECHOCARDIOGRAM REPORT   Patient Name:   Suzanne Dixon Date of Exam: 06/21/2021 Medical Rec #:  [id number]      Height:       64.0 in Accession #:    [id number]     Weight:       249.8 lb Date of Birth:  09/22/1950       BSA:          2.150 m Patient Age:    69 years       BP:           112/66 mmHg Patient Gender: F              HR:           63 bpm. Exam Location:  [facility] Procedure: 2D Echo, Color Doppler, Cardiac Doppler and Intracardiac            Opacification Agent Indications:  Elevated troponin  History:         Patient has prior history of Echocardiogram  examinations, most                  recent 02/02/2021. Risk Factors:Sleep Apnea. Breast cancer.  Sonographer:     Charmayne Sheer Referring Phys:  [id number] [place] M ECKSTAT Diagnosing Phys: Donnelly Angelica  Sonographer Comments: Suboptimal parasternal window and suboptimal apical window. IMPRESSIONS  1. Left ventricular ejection fraction, by estimation, is 50 to 55%. The left ventricle has low normal function. The left ventricle has no regional wall motion abnormalities. There is mild left ventricular hypertrophy. Left ventricular diastolic parameters are consistent with Grade I diastolic dysfunction (impaired relaxation).  2. Right ventricular systolic function is low normal. The right ventricular size is mildly enlarged.  3. The mitral valve is normal in structure. Trivial mitral valve regurgitation.  4. Tricuspid valve regurgitation is moderate.  5. The aortic valve is normal in structure. Aortic valve regurgitation is mild. Mild aortic valve sclerosis is present, with no evidence of aortic valve stenosis.  6. The inferior vena cava is normal in size with greater than 50% respiratory variability, suggesting right atrial pressure of 3 mmHg. FINDINGS  Left Ventricle: Left ventricular ejection fraction, by estimation, is 50 to 55%. The left ventricle has low normal function. The left ventricle has no regional wall motion abnormalities. Definity contrast agent was given IV to delineate the left ventricular endocardial borders. The left ventricular internal cavity size was normal in size. There is mild left ventricular hypertrophy. Left ventricular diastolic parameters are consistent with Grade I diastolic dysfunction (impaired relaxation). Right Ventricle: The right ventricular size is mildly enlarged. Right vetricular wall thickness was not well visualized. Right ventricular systolic function is low normal. Left Atrium: Left atrial size was normal in size. Right Atrium: Right atrial size was not well visualized. Pericardium:  There is no evidence of pericardial effusion. Mitral Valve: The mitral valve is normal in structure. Mild mitral annular calcification. Trivial mitral valve regurgitation. MV peak gradient, 1.9 mmHg. The mean mitral valve gradient is 1.0 mmHg. Tricuspid Valve: The tricuspid valve is normal in structure. Tricuspid valve regurgitation is moderate. Aortic Valve: The aortic valve is normal in structure. Aortic valve regurgitation is mild. Mild aortic valve sclerosis is present, with no evidence of aortic valve stenosis. Aortic valve mean gradient measures 4.0 mmHg. Aortic valve peak gradient measures 7.0 mmHg. Aortic valve area, by VTI measures 2.05 cm. Pulmonic Valve: The pulmonic valve was not well visualized. Pulmonic valve regurgitation is not visualized. No evidence of pulmonic stenosis. Aorta: The aortic root is normal in size and structure. Venous: The inferior vena cava is normal in size with greater than 50% respiratory variability, suggesting right atrial pressure of 3 mmHg. IAS/Shunts: The interatrial septum was not well visualized.  LEFT VENTRICLE PLAX 2D LVIDd:         1.06 cm     Diastology LVIDs:         0.90 cm     LV e' medial:    4.90 cm/s LV PW:         3.87 cm     LV E/e' medial:  13.6 LVOT diam:     2.00 cm     LV e' lateral:   8.49 cm/s LV SV:         53          LV E/e' lateral: 7.8 LV SV Index:   25 LVOT Area:  3.14 cm  LV Volumes (MOD) LV vol d, MOD A2C: 66.9 ml LV vol d, MOD A4C: 82.8 ml LV vol s, MOD A2C: 39.1 ml LV vol s, MOD A4C: 44.6 ml LV SV MOD A2C:     27.8 ml LV SV MOD A4C:     82.8 ml LV SV MOD BP:      33.3 ml LEFT ATRIUM             Index LA Vol (A2C):   38.3 ml 17.81 ml/m LA Vol (A4C):   37.6 ml 17.49 ml/m LA Biplane Vol: 39.4 ml 18.32 ml/m  AORTIC VALVE                   PULMONIC VALVE AV Area (Vmax):    1.65 cm    PV Vmax:       0.93 m/s AV Area (Vmean):   1.73 cm    PV Vmean:      61.900 cm/s AV Area (VTI):     2.05 cm    PV VTI:        0.189 m AV Vmax:            132.00 cm/s PV Peak grad:  3.5 mmHg AV Vmean:          90.100 cm/s PV Mean grad:  2.0 mmHg AV VTI:            0.260 m AV Peak Grad:      7.0 mmHg AV Mean Grad:      4.0 mmHg LVOT Vmax:         69.20 cm/s LVOT Vmean:        49.700 cm/s LVOT VTI:          0.170 m LVOT/AV VTI ratio: 0.65  AORTA Ao Root diam: 3.30 cm MITRAL VALVE               TRICUSPID VALVE MV Area (PHT): 3.03 cm    TR Peak grad:   28.1 mmHg MV Area VTI:   2.13 cm    TR Vmax:        265.00 cm/s MV Peak grad:  1.9 mmHg MV Mean grad:  1.0 mmHg    SHUNTS MV Vmax:       0.69 m/s    Systemic VTI:  0.17 m MV Vmean:      40.7 cm/s   Systemic Diam: 2.00 cm MV Decel Time: 250 msec MV E velocity: 66.40 cm/s MV A velocity: 52.30 cm/s MV E/A ratio:  1.27 Donnelly Angelica Electronically signed by Donnelly Angelica Signature Date/Time: 06/21/2021/12:37:12 PM    Final         Scheduled Meds:  allopurinol  300 mg Oral Daily   carbidopa-levodopa  1.5 tablet Oral TID   DULoxetine  60 mg Oral BID   enoxaparin (LOVENOX) injection  1 mg/kg Subcutaneous Q12H   furosemide  40 mg Oral Daily   gabapentin  600 mg Oral QID   levothyroxine  50 mcg Oral Q0600   meloxicam  15 mg Oral Daily   metoprolol tartrate  200 mg Oral BID   oxybutynin  5 mg Oral BID   pantoprazole  40 mg Oral Daily   potassium chloride  10 mEq Oral TID   sodium chloride flush  3 mL Intravenous Q12H   vitamin B-12  1,000 mcg Oral Daily   warfarin  10 mg Oral ONCE-1600   Warfarin - Pharmacist Dosing Inpatient   Does not apply [id number]  Continuous Infusions:  remdesivir 100 mg in NS 100 mL Stopped (06/21/21 1000)    Assessment & Plan:   Active Problems:   AF (paroxysmal atrial fibrillation) (HCC)   B12 deficiency   Depression, major, recurrent, moderate (HCC)   Breast cancer (HCC)   Large granular lymphocytic leukemia (HCC)   Polyneuropathy associated with underlying disease ([place])   CVA (cerebral vascular accident) ([place])   COVID-19 virus infection   Suzanne Dixon is a 70 y.o. female  with hx of A. fib, breast cancer, CVA, polyneuropathy, lymphocytic leukemia, presents with altered mental status in setting of new COVID infection.   #Altered mental status #Delirium #Acute metabolic encephalopathy #[id number] infection #Lactic acidosis Patient presenting with altered mental status consistent with delirium in setting of COVID-19 infection and signs of dehydration.  Lactic acidosis has resolved with fluid resuscitation.  Discontinue broad-spectrum antibiotics 10/4 not hypoxic , will defer steroids at this time.  CRP low Ferritin trending down D-dimer trending down Continue remdesivir PT/OT-rec. SNF  #Elevated troponin Asx Cadiology consulted , pending Echo EF 50 to 55%.  No regional wall motion abnormalities.  Mild LVH and grade 1 diastolic dysfunction  Hypomagnesemia Magnesium 1.6 Will give 2 g IV magnesium sulfate Check a.m. level   #Chronic medical problems Gout-continue allopurinol   Parkinson's-continue Sinemet  Neuropathy-continue Cymbalta and gabapentin    Lower extremity edema-continue Lasix, decrease from 40mg  to 20mg  as  bp on low side  Hypothyroidism-continue Synthroid   A. fib-continue metoprolol, warfarin.   INR subtherapeutic  Pharmacy dosing   GERD-continue PPI  OAB-continue oxybutynin   DVT prophylaxis: On Coumadin Code Status: Full Family Communication: None at bedside Disposition Plan:  Status is: Inpatient  Patient remains inpatient due to complexity of illness, requiring IV treatment and hospitalization Dispo: The patient is from: Home              Anticipated d/c is to: SNF               Patient currently is not medically stable to d/c.   Difficult to place patient No      Needs full 10 day quarantine for SNF. Pt now agreeable to SNF as initially thought insurance may not cover.      LOS: 3 days   Time spent: 35 minutes with more than 50% on [place], MD [facility] Pager [phone]  [phone]  If 7PM-7AM, please contact night-coverage 06/22/2021, 8:19 AM

## 2021-06-23 DIAGNOSIS — I480 Paroxysmal atrial fibrillation: Secondary | ICD-10-CM | POA: Diagnosis not present

## 2021-06-23 DIAGNOSIS — E872 Acidosis, unspecified: Secondary | ICD-10-CM | POA: Diagnosis not present

## 2021-06-23 DIAGNOSIS — U071 COVID-19: Secondary | ICD-10-CM | POA: Diagnosis not present

## 2021-06-23 LAB — D-DIMER, QUANTITATIVE: D-Dimer, Quant: 0.52 ug/mL-FEU — ABNORMAL HIGH (ref 0.00–0.50)

## 2021-06-23 LAB — CBC WITH DIFFERENTIAL/PLATELET
Abs Immature Granulocytes: 0.01 10*3/uL (ref 0.00–0.07)
Basophils Absolute: 0 10*3/uL (ref 0.0–0.1)
Basophils Relative: 1 %
Eosinophils Absolute: 0.1 10*3/uL (ref 0.0–0.5)
Eosinophils Relative: 2 %
HCT: 39.1 % (ref 36.0–46.0)
Hemoglobin: 13.1 g/dL (ref 12.0–15.0)
Immature Granulocytes: 0 %
Lymphocytes Relative: 47 %
Lymphs Abs: 2.4 10*3/uL (ref 0.7–4.0)
MCH: 30.8 pg (ref 26.0–34.0)
MCHC: 33.5 g/dL (ref 30.0–36.0)
MCV: 91.8 fL (ref 80.0–100.0)
Monocytes Absolute: 0.9 10*3/uL (ref 0.1–1.0)
Monocytes Relative: 17 %
Neutro Abs: 1.7 10*3/uL (ref 1.7–7.7)
Neutrophils Relative %: 33 %
Platelets: 248 10*3/uL (ref 150–400)
RBC: 4.26 MIL/uL (ref 3.87–5.11)
RDW: 13.9 % (ref 11.5–15.5)
WBC: 5.1 10*3/uL (ref 4.0–10.5)
nRBC: 0 % (ref 0.0–0.2)

## 2021-06-23 LAB — CULTURE, BLOOD (SINGLE): Special Requests: ADEQUATE

## 2021-06-23 LAB — PHOSPHORUS: Phosphorus: 3.1 mg/dL (ref 2.5–4.6)

## 2021-06-23 LAB — PROTIME-INR
INR: 2 — ABNORMAL HIGH (ref 0.8–1.2)
Prothrombin Time: 22.5 seconds — ABNORMAL HIGH (ref 11.4–15.2)

## 2021-06-23 LAB — C-REACTIVE PROTEIN: CRP: 0.7 mg/dL (ref <1.0)

## 2021-06-23 LAB — FERRITIN: Ferritin: 118 ng/mL (ref 11–307)

## 2021-06-23 LAB — POTASSIUM: Potassium: 3.9 mmol/L (ref 3.5–5.1)

## 2021-06-23 LAB — MAGNESIUM: Magnesium: 1.8 mg/dL (ref 1.7–2.4)

## 2021-06-23 MED ORDER — WARFARIN SODIUM 6 MG PO TABS
7.0000 mg | ORAL_TABLET | Freq: Once | ORAL | Status: AC
  Administered 2021-06-23: 7 mg via ORAL
  Filled 2021-06-23: qty 1

## 2021-06-23 MED ORDER — OXYCODONE HCL 5 MG PO TABS
5.0000 mg | ORAL_TABLET | ORAL | Status: DC | PRN
  Administered 2021-06-25 – 2021-06-26 (×2): 5 mg via ORAL
  Filled 2021-06-23 (×2): qty 1

## 2021-06-23 NOTE — TOC Progression Note (Signed)
Transition of Care [facility]) - Progression Note    Patient Details  Name: Suzanne Dixon MRN: [id number] Date of Birth: 1951-09-06  Transition of Care [facility]) CM/SW Contact  Beverly Sessions, RN Phone Number: 06/23/2021, 2:31 PM  Clinical Narrative:     30 day note sent to MD for sign Clinical uploaded to [place] [facility] must   Presented bed offer to patient.  She defers decision to daughter.  Daughter would like to accept bed at [facility].  Patient can discharge after covid isolation on 10/12.  Tina at [facility] to start auth on Friday 10/7  Expected Discharge Plan: [place] Barriers to Discharge: Continued Medical Work up  Expected Discharge Plan and Services Expected Discharge Plan: [place] Choice: Resumption of Svcs/PTA Provider Living arrangements for the past 2 months: Single Family Home                           HH Arranged: RN, PT, OT, Nurse's Aide, Social Work [employer] Agency: [place] Date [place]: 06/21/21   Representative spoke with at [place]: [place] [facility]   Social Determinants of Health ([place]) Interventions    Readmission Risk Interventions Readmission Risk Prevention Plan 06/21/2021  Transportation Screening Complete  PCP or Specialist Appt within 3-5 Days Complete  HRI or [place] Complete  Social Work Consult for [place] Planning/Counseling Complete  Palliative Care Screening Not Applicable  Medication Review Press photographer) Complete  Some recent data might be hidden

## 2021-06-23 NOTE — Progress Notes (Signed)
Suzanne Dixon  WFU:[id number] DOB: [birth year] DOA: 06/19/2021 PCP: Adin Hector, MD    Brief Narrative:  70 y.o. female with hx of A. fib, breast cancer, CVA, polyneuropathy, lymphocytic leukemia, presents with altered mental status. She reports she had COVID 2 months prior. She endorses body aches and chills.  Denies any other symptoms.  She is unsure where she is. COVID test returned positive.  UA showed extremely concentrated urine and elevated pH.  Blood cultures were obtained.  CT chest abdomen and pelvis was obtained which showed no PE in the chest and no acute findings in the abdomen.  CT head without contrast was also unremarkable.  She was given a 1 L normal saline bolus, started on broad-spectrum antibiotics, and admitted for further management.  Consultants:  None  Code Status: FULL CODE  Date of Positive COVID Test:  06/19/2021 (last day of isolation 06/29/21)  COVID-19 specific Treatment: Remdesivir 10/1 > 10/5  Antimicrobials:  Cefepime 10/1 Flagyl 10/1 Vancomycin 10/1 Keflex 10/4  DVT prophylaxis: Warfarin  Subjective: Resting comfortably in bed.  No respiratory symptoms.  States she feels much better overall apart from simply feeling weak and unsteady when attempting to ambulate.  Assessment & Plan:  Acute metabolic encephalopathy -acute delirium Felt to be due to dehydration related to COVID -resolved with fluid resuscitation  COVID infection Was dosed with Remdesivir earlier in hospital stay  Lactic acidosis Due to simple severe dehydration/volume depletion -resolved with IV fluid resuscitation  Elevated troponin Mild - no chest pain - no further evaluation indicated   Hypomagnesemia Due to poor oral intake -supplemented  Citrobacter in urine cx Has been dosed with antibiotics  Gout Continue allopurinol  Parkinson's disease Continue Sinemet  Chronic neuropathy Continue Cymbalta and gabapentin  Chronic bilateral lower extremity  edema Titrate Lasix as appropriate based upon volume status and symptoms  Hypothyroidism Continue usual Synthroid  Atrial fibrillation Continue beta-blocker and warfarin  GERD   Family Communication: No family present at time of exam Status is: Inpatient  Remains inpatient appropriate because:Unsafe d/c plan  Dispo: The patient is from: Home              Anticipated d/c is to: SNF              Patient currently is medically stable to d/c.   Difficult to place patient No    Objective: Blood pressure 111/60, pulse 64, temperature (!) 97.5 F (36.4 C), temperature source Oral, resp. rate 18, height 5\' 4"  (1.626 m), weight 113.3 kg, SpO2 100 %.  Intake/Output Summary (Last 24 hours) at 06/23/2021 1000 Last data filed at 06/23/2021 0300 Gross per 24 hour  Intake --  Output 1050 ml  Net -1050 ml   Filed Weights   06/19/21 1258  Weight: 113.3 kg    Examination: General: No acute respiratory distress Lungs: Clear to auscultation bilaterally without wheezes or crackles Cardiovascular: Regular rate and rhythm without murmur gallop or rub normal S1 and S2 Abdomen: Nontender, nondistended, soft, bowel sounds positive, no rebound, no ascites, no appreciable mass Extremities: No significant cyanosis, clubbing, or edema bilateral lower extremities  CBC: Recent Labs  Lab 06/21/21 0437 06/22/21 0429 06/23/21 0454  WBC 6.1 5.8 5.1  NEUTROABS 2.1 1.9 1.7  HGB 12.5 12.9 13.1  HCT 36.9 37.1 39.1  MCV 92.0 93.5 91.8  PLT 259 225 202   Basic Metabolic Panel: Recent Labs  Lab 06/20/21 0446 06/21/21 0437 06/22/21 0429 06/23/21 0454  NA 140 138  138  --   K 3.5 3.6 3.7 3.9  CL 106 105 106  --   CO2 28 28 27   --   GLUCOSE 93 88 87  --   BUN 9 13 15   --   CREATININE 0.75 0.65 0.69  --   CALCIUM 8.3* 8.2* 8.0*  --   MG 1.6* 1.8 1.6* 1.8  PHOS 3.9 3.7 2.8 3.1   GFR: Estimated Creatinine Clearance: 78.4 mL/min (by C-G formula based on SCr of 0.69 mg/dL).  Liver Function  Tests: Recent Labs  Lab 06/19/21 1145 06/20/21 0446 06/21/21 0437 06/22/21 0429  AST 37 24 25 39  ALT 17 <5 <5 <5  ALKPHOS 68 60 55 62  BILITOT 1.2 0.9 0.6 0.5  PROT 7.1 6.0* 5.8* 5.7*  ALBUMIN 3.5 2.9* 2.8* 2.8*     Coagulation Profile: Recent Labs  Lab 06/19/21 1354 06/20/21 0446 06/21/21 0437 06/22/21 0429 06/23/21 0454  INR 1.4* 1.4* 1.7* 1.7* 2.0*     HbA1C: Hgb A1c MFr Bld  Date/Time Value Ref Range Status  02/02/2021 05:55 AM 5.7 (H) 4.8 - 5.6 % Final    Comment:    (NOTE) Pre diabetes:          5.7%-6.4%  Diabetes:              >6.4%  Glycemic control for   <7.0% adults with diabetes     Recent Results (from the past 240 hour(s))  Blood culture (single)     Status: Abnormal   Collection Time: 06/19/21 11:45 AM   Specimen: BLOOD  Result Value Ref Range Status   Specimen Description   Final    BLOOD BLOOD LEFT HAND Performed at [facility], [address] [place], [place] [address]    Special Requests   Final    BOTTLES DRAWN AEROBIC AND ANAEROBIC Blood Culture adequate volume Performed at [facility], [address] [place], [place] [address]    Culture  Setup Time   Final    GRAM POSITIVE RODS IN BOTH AEROBIC AND ANAEROBIC BOTTLES CRITICAL RESULT CALLED TO, READ BACK BY AND VERIFIED WITH: MORGAN HICKS AT 1927 06/20/21.PMF    Culture (A)  Final    DIPHTHEROIDS(CORYNEBACTERIUM SPECIES) Standardized susceptibility testing for this organism is not available. Performed at [facility], [place] [address], [place], [place] [address]    Report Status 06/23/2021 FINAL  Final  Resp Panel by RT-PCR (Flu A&B, Covid) Nasopharyngeal Swab     Status: Abnormal   Collection Time: 06/19/21 12:00 PM   Specimen: Nasopharyngeal Swab; Nasopharyngeal(NP) swabs in vial transport medium  Result Value Ref Range Status   SARS Coronavirus 2 by RT PCR POSITIVE (A) NEGATIVE Final    Comment: RESULT CALLED TO, READ BACK BY AND VERIFIED  WITH: C/RONNIE PFLUGER 06/19/21 1344 AMK (NOTE) SARS-CoV-2 target nucleic acids are DETECTED.  The SARS-CoV-2 RNA is generally detectable in upper respiratory specimens during the acute phase of infection. Positive results are indicative of the presence of the identified virus, but do not rule out bacterial infection or co-infection with other pathogens not detected by the test. Clinical correlation with patient history and other diagnostic information is necessary to determine patient infection status. The expected result is Negative.  Fact Sheet for Patients: EntrepreneurPulse.com.au  Fact Sheet for Healthcare Providers: IncredibleEmployment.be  This test is not yet approved or cleared by the [place] FDA and  has been authorized for detection and/or diagnosis of SARS-CoV-2 by FDA under  an Emergency Use Authorization (EUA).  This EUA will remain in effect (meaning this test can be  used) for the duration of  the COVID-19 declaration under Section 564(b)(1) of the Act, 21 U.S.C. section 360bbb-3(b)(1), unless the authorization is terminated or revoked sooner.     Influenza A by PCR NEGATIVE NEGATIVE Final   Influenza B by PCR NEGATIVE NEGATIVE Final    Comment: (NOTE) The Xpert Xpress SARS-CoV-2/FLU/RSV plus assay is intended as an aid in the diagnosis of influenza from Nasopharyngeal swab specimens and should not be used as a sole basis for treatment. Nasal washings and aspirates are unacceptable for Xpert Xpress SARS-CoV-2/FLU/RSV testing.  Fact Sheet for Patients: EntrepreneurPulse.com.au  Fact Sheet for Healthcare Providers: IncredibleEmployment.be  This test is not yet approved or cleared by the [place] FDA and has been authorized for detection and/or diagnosis of SARS-CoV-2 by FDA under an Emergency Use Authorization (EUA). This EUA will remain in effect (meaning this test can be used)  for the duration of the COVID-19 declaration under Section 564(b)(1) of the Act, 21 U.S.C. section 360bbb-3(b)(1), unless the authorization is terminated or revoked.  Performed at [facility], [address] [place], [place] [address]   Blood culture (single)     Status: None (Preliminary result)   Collection Time: 06/19/21  1:18 PM   Specimen: BLOOD  Result Value Ref Range Status   Specimen Description BLOOD LEFT ANTECUBITAL  Final   Special Requests   Final    BOTTLES DRAWN AEROBIC AND ANAEROBIC Blood Culture adequate volume   Culture   Final    NO GROWTH 4 DAYS Performed at [facility], [address], [place], [place] [address]    Report Status PENDING  Incomplete  Urine Culture     Status: Abnormal   Collection Time: 06/19/21  2:30 PM   Specimen: Urine, Clean Catch  Result Value Ref Range Status   Specimen Description   Final    URINE, CLEAN CATCH Performed at [facility], [address] [place], [place] [address]    Special Requests   Final    NONE Performed at [facility], [address], [place], [place] [address]    Culture >=100,000 COLONIES/mL CITROBACTER KOSERI (A)  Final   Report Status 06/22/2021 FINAL  Final   Organism ID, Bacteria CITROBACTER KOSERI (A)  Final      Susceptibility   Citrobacter koseri - MIC*    CEFAZOLIN <=4 SENSITIVE Sensitive     CEFEPIME <=0.12 SENSITIVE Sensitive     CEFTRIAXONE <=0.25 SENSITIVE Sensitive     CIPROFLOXACIN <=0.25 SENSITIVE Sensitive     GENTAMICIN <=1 SENSITIVE Sensitive     IMIPENEM <=0.25 SENSITIVE Sensitive     NITROFURANTOIN 32 SENSITIVE Sensitive     TRIMETH/SULFA <=20 SENSITIVE Sensitive     PIP/TAZO <=4 SENSITIVE Sensitive     * >=100,000 COLONIES/mL CITROBACTER KOSERI     Scheduled Meds:  acidophilus  2 capsule Oral TID   allopurinol  300 mg Oral Daily   carbidopa-levodopa  1.5 tablet Oral TID   cephALEXin  500 mg Oral Q8H   DULoxetine  60 mg Oral BID    furosemide  20 mg Oral Daily   gabapentin  600 mg Oral QID   levothyroxine  50 mcg Oral Q0600   meloxicam  15 mg Oral Daily   metoprolol tartrate  200 mg Oral BID   oxybutynin  5 mg Oral BID   pantoprazole  40 mg Oral  Daily   potassium chloride  10 mEq Oral TID   sodium chloride flush  3 mL Intravenous Q12H   vitamin B-12  1,000 mcg Oral Daily   warfarin  7 mg Oral ONCE-1600   Warfarin - Pharmacist Dosing Inpatient   Does not apply q1600     LOS: 4 days   Cherene Altes, MD [facility] Office  [phone] Pager - Text Page per Shea Evans  If 7PM-7AM, please contact night-coverage per Amion 06/23/2021, 10:00 AM

## 2021-06-23 NOTE — Progress Notes (Signed)
Physical Therapy Treatment Patient Details Name: Suzanne Dixon MRN: [id number] DOB: [birth year] Today's Date: 06/23/2021   History of Present Illness Pt is a 70 y.o. female with hx of A. fib, breast cancer, CVA, polyneuropathy, Parkinson's Dz, and lymphocytic leukemia who presented with altered mental status. MD assessment includes: AMS, delirium, acute metabolic encephalopathy, TDVVO-16 infection, lactic acidosis, elevated troponin, and LE edema.    PT Comments    Pt was pleasant and motivated to participate during the session and put forth good effort throughout.  Pt required extra time and effort with mobility tasks but no physical assistance this session.  Pt was able to increase her max amb distance to 10 feet before needing a seated therapeutic rest break with SpO2 and HR WNL throughout the session on room air.  Pt will benefit from PT services in a SNF setting upon discharge to safely address deficits listed in patient problem list for decreased caregiver assistance and eventual return to PLOF.     Recommendations for follow up therapy are one component of a multi-disciplinary discharge planning process, led by the attending physician.  Recommendations may be updated based on patient status, additional functional criteria and insurance authorization.  Follow Up Recommendations  SNF;Supervision for mobility/OOB     Equipment Recommendations  None recommended by PT    Recommendations for Other Services       Precautions / Restrictions Precautions Precautions: Fall Restrictions Weight Bearing Restrictions: No     Mobility  Bed Mobility Overal bed mobility: Modified Independent             General bed mobility comments: Extra time and effort only, no physical assist needed with sup to sit    Transfers Overall transfer level: Needs assistance Equipment used: Rolling walker (2 wheeled) Transfers: Sit to/from Stand Sit to Stand: Min guard;From elevated surface          General transfer comment: Pt requird extra time and effort as well as for the bed to be elevated to come to standing; from the recliner pt required BUE assist to stand  Ambulation/Gait Ambulation/Gait assistance: Min guard Gait Distance (Feet): 10 Feet x 1, 5 Feet x 1 Assistive device: Rolling walker (2 wheeled) Gait Pattern/deviations: Step-through pattern;Decreased step length - right;Decreased step length - left Gait velocity: decreased   General Gait Details: Pt able to amb a max of 10 feet this session but with slow, effortful cadence and mod lean on the RW for support; SpO2 and HR WNL on room air throughout the session   Stairs             Wheelchair Mobility    Modified Rankin (Stroke Patients Only)       Balance Overall balance assessment: Needs assistance   Sitting balance-Leahy Scale: Good     Standing balance support: Bilateral upper extremity supported;During functional activity Standing balance-Leahy Scale: Fair Standing balance comment: Min to mod lean on the RW for support                            Cognition Arousal/Alertness: Awake/alert Behavior During Therapy: WFL for tasks assessed/performed Overall Cognitive Status: Within Functional Limits for tasks assessed                                        Exercises Total Joint Exercises Ankle Circles/Pumps: AROM;Strengthening;Both;10 reps Quad Sets:  Strengthening;Both;10 reps Gluteal Sets: Strengthening;Both;10 reps Long Arc Quad: AROM;Strengthening;Both;10 reps Knee Flexion: 10 reps;Both;Strengthening;AROM Marching in Standing: AROM;Strengthening;Both;5 reps;Standing Other Exercises Other Exercises: HEP review for BLE APs, QS, GS, and LAQs x 10 each every 1-2 hours daily    General Comments        Pertinent Vitals/Pain Pain Assessment: No/denies pain    Home Living                      Prior Function            PT Goals (current goals can now  be found in the care plan section) Progress towards PT goals: Progressing toward goals    Frequency    Min 2X/week      PT Plan Current plan remains appropriate    Co-evaluation              AM-PAC PT "6 Clicks" Mobility   Outcome Measure  Help needed turning from your back to your side while in a flat bed without using bedrails?: A Little Help needed moving from lying on your back to sitting on the side of a flat bed without using bedrails?: A Little Help needed moving to and from a bed to a chair (including a wheelchair)?: A Little Help needed standing up from a chair using your arms (e.g., wheelchair or bedside chair)?: A Little Help needed to walk in hospital room?: A Lot Help needed climbing 3-5 steps with a railing? : Total 6 Click Score: 15    End of Session Equipment Utilized During Treatment: Gait belt Activity Tolerance: Patient tolerated treatment well Patient left: in chair;with call bell/phone within reach;with chair alarm set Nurse Communication: Mobility status PT Visit Diagnosis: History of falling (Z91.81);Difficulty in walking, not elsewhere classified (R26.2);Muscle weakness (generalized) (M62.81)     Time: 5732-2025 PT Time Calculation (min) (ACUTE ONLY): 40 min  Charges:  $Gait Training: 8-22 mins $Therapeutic Exercise: 8-22 mins $Therapeutic Activity: 8-22 mins                     D. Scott Roshawna Colclasure PT, DPT 06/23/21, 11:21 AM

## 2021-06-23 NOTE — Consult Note (Signed)
[place] for warfarin with Lovenox bridging  Indication: atrial fibrillation  Allergies  Allergen Reactions   Advair Diskus [Fluticasone-Salmeterol] Other (See Comments)    Joint pain    Alendronate     Muscle pain   Singulair [Montelukast] Other (See Comments)    "muscle pain"   Sulfa Antibiotics Hives   Venlafaxine Other (See Comments)    "altered mental status/tremors"    Patient Measurements: Height: 5\' 4"  (162.6 cm) Weight: 113.3 kg (249 lb 12.8 oz) IBW/kg (Calculated) : 54.7  Vital Signs: Temp: 97.6 F (36.4 C) (10/05 0325) Temp Source: Oral (10/05 0325) BP: 123/75 (10/05 0325) Pulse Rate: 62 (10/05 0325)  Labs: Recent Labs    06/21/21 0437 06/22/21 0429 06/23/21 0454  HGB 12.5 12.9 13.1  HCT 36.9 37.1 39.1  PLT 259 225 248  LABPROT 19.6* 20.2* 22.5*  INR 1.7* 1.7* 2.0*  CREATININE 0.65 0.69  --      Estimated Creatinine Clearance: 78.4 mL/min (by C-G formula based on SCr of 0.69 mg/dL).   Medical History: Past Medical History:  Diagnosis Date   A-fib ([place])    Anemia    B12 deficiency    Breast cancer ([place]) 03/31/2013   Right - chemo- mastectomy   Breast cancer ([place]) 1991   Rt.- radiation   Complication of anesthesia 01/2009   Recent years with general anesthesia had itching following surgery   DDD (degenerative disc disease), cervical    Depression    Diverticulitis    Dyspnea    Edema of both legs    GERD (gastroesophageal reflux disease)    Gout    H/O mastectomy 2014   per patient   H/O: cesarean section 1987   per patient report   H/O: hysterectomy 1996   per patient   Hydradenitis    Hypertension    Hypothyroidism    Neuropathy    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 female with hx of A. fib, breast cancer, CVA, polyneuropathy, lymphocytic leukemia, presents with altered mental status and found to be COVID positive. Pharmacy has been consulted for warfarin dosing for A. Fib with Lovenox bridge for subtherapeutic INR on admission.  Baseline labs: INR 1.4; H/H and plts WNL; D-dimer 1.02  DDIs:  Warfarin + levothyroxine >> may increase INR Warfarin + meloxicam >> may increase risk of bleeding.  Date INR Dose 9/30 -- 7 mg  10/1 1.4 10 mg 10/2 1.4 7.5 mg 10/3 1.7 7.5 mg 10/4 1.7 10 mg 10/5 2  7  mg  Goal of Therapy:  INR 2-3 Monitor platelets by anticoagulation protocol: Yes   Plan:  INR is therapeutic, but at lower limit of goal Predict INR will trend up tomorrow. Will restart pt's home regimen and give warfarin 7 mg x 1 and d/c enoxaparin. Daily INR ordered. CBC at least every 3 days.   Oswald Hillock, PharmD Clinical Pharmacist 06/23/2021,8:05 AM

## 2021-06-23 NOTE — Care Management (Signed)
RE: Suzanne Dixon Date of Birth: [birth year] Date: 06/22/2021     To Whom It May Concern:   Please be advised that the above-named patient will require a short-term nursing home stay - anticipated 30 days or less for rehabilitation and strengthening.  The plan is for return home.

## 2021-06-24 DIAGNOSIS — I480 Paroxysmal atrial fibrillation: Secondary | ICD-10-CM | POA: Diagnosis not present

## 2021-06-24 DIAGNOSIS — U071 COVID-19: Secondary | ICD-10-CM | POA: Diagnosis not present

## 2021-06-24 DIAGNOSIS — E872 Acidosis, unspecified: Secondary | ICD-10-CM | POA: Diagnosis not present

## 2021-06-24 LAB — PROTIME-INR
INR: 2.5 — ABNORMAL HIGH (ref 0.8–1.2)
Prothrombin Time: 27 seconds — ABNORMAL HIGH (ref 11.4–15.2)

## 2021-06-24 LAB — CULTURE, BLOOD (SINGLE)
Culture: NO GROWTH
Special Requests: ADEQUATE

## 2021-06-24 MED ORDER — ACETAMINOPHEN 500 MG PO TABS
500.0000 mg | ORAL_TABLET | Freq: Four times a day (QID) | ORAL | Status: DC | PRN
  Administered 2021-06-25 – 2021-06-28 (×2): 500 mg via ORAL
  Filled 2021-06-24 (×2): qty 1

## 2021-06-24 MED ORDER — WARFARIN SODIUM 6 MG PO TABS
7.0000 mg | ORAL_TABLET | Freq: Once | ORAL | Status: AC
  Administered 2021-06-24: 7 mg via ORAL
  Filled 2021-06-24: qty 1

## 2021-06-24 NOTE — Progress Notes (Signed)
Occupational Therapy Treatment Patient Details Name: Suzanne Dixon MRN: [id number] DOB: 15-May-1951 Today's Date: 06/24/2021   History of present illness Pt is a 70 y.o. female with hx of A. fib, breast cancer, CVA, polyneuropathy, Parkinson's Dz, and lymphocytic leukemia who presented with altered mental status. MD assessment includes: AMS, delirium, acute metabolic encephalopathy, QTMAU-63 infection, lactic acidosis, elevated troponin, and LE edema.   OT comments  Upon entering the room, pt seated in recliner chair with no c/o pain and requesting to wash self. Pt requires set up A for UB self care. She stands with min A from recliner chair and needing assistance with balance and doffing disposable brief. Pt standing to wash buttocks and peri area. She needs assistance to don B socks and thread brief onto feet and fully over B hips. Pt dons hospital gown with set up A. She does tire quickly and needing multiple rest breaks but very motivated for session. Chair alarm activated and pt requesting to remain in chair to rest. All needs within reach.    Recommendations for follow up therapy are one component of a multi-disciplinary discharge planning process, led by the attending physician.  Recommendations may be updated based on patient status, additional functional criteria and insurance authorization.    Follow Up Recommendations  SNF;Supervision - Intermittent    Equipment Recommendations  None recommended by OT       Precautions / Restrictions Precautions Precautions: Fall       Mobility Bed Mobility               General bed mobility comments: seated in recliner chair    Transfers Overall transfer level: Needs assistance Equipment used: Rolling walker (2 wheeled) Transfers: Sit to/from Stand Sit to Stand: Min assist Stand pivot transfers: Min assist       General transfer comment: from recliner chair    Balance Overall balance assessment: Needs assistance   Sitting  balance-Leahy Scale: Good Sitting balance - Comments: sitting EOC for bathing tasks   Standing balance support: Bilateral upper extremity supported;During functional activity Standing balance-Leahy Scale: Fair Standing balance comment: RW support and min A for balance for LB bathing and dressing in standing                           ADL either performed or assessed with clinical judgement   ADL Overall ADL's : Needs assistance/impaired         Upper Body Bathing: Set up;Sitting   Lower Body Bathing: Moderate assistance;Sit to/from stand;Sitting/lateral leans   Upper Body Dressing : Set up;Sitting   Lower Body Dressing: Maximal assistance Lower Body Dressing Details (indicate cue type and reason): to don B socks and pull up brief                     Vision Patient Visual Report: No change from baseline            Cognition Arousal/Alertness: Awake/alert Behavior During Therapy: WFL for tasks assessed/performed Overall Cognitive Status: Within Functional Limits for tasks assessed                                 General Comments: Pt is pleasant and cooperative                   Pertinent Vitals/ Pain       Pain Assessment: Faces Faces  Pain Scale: Hurts little more Pain Location: B knees Pain Descriptors / Indicators: Aching;Discomfort Pain Intervention(s): Limited activity within patient's tolerance;Repositioned;Monitored during session         Frequency  Min 2X/week        Progress Toward Goals  OT Goals(current goals can now be found in the care plan section)  Progress towards OT goals: Progressing toward goals  Acute Rehab OT Goals Patient Stated Goal: To get stronger and go home OT Goal Formulation: With patient/family Time For Goal Achievement: 07/05/21 Potential to Achieve Goals: Good  Plan Discharge plan remains appropriate;Frequency remains appropriate       AM-PAC OT "6 Clicks" Daily Activity      Outcome Measure   Help from another person eating meals?: None Help from another person taking care of personal grooming?: A Little Help from another person toileting, which includes using toliet, bedpan, or urinal?: A Little Help from another person bathing (including washing, rinsing, drying)?: A Little Help from another person to put on and taking off regular upper body clothing?: A Little Help from another person to put on and taking off regular lower body clothing?: A Lot 6 Click Score: 18    End of Session Equipment Utilized During Treatment: Rolling walker  OT Visit Diagnosis: Unsteadiness on feet (R26.81);Repeated falls (R29.6);Muscle weakness (generalized) (M62.81)   Activity Tolerance Patient tolerated treatment well   Patient Left with call bell/phone within reach;in chair;with chair alarm set   Nurse Communication Mobility status        Time: 7169-6789 OT Time Calculation (min): 45 min  Charges: OT General Charges $OT Visit: 1 Visit OT Treatments $Self Care/Home Management : 38-52 mins  Darleen Crocker, MS, OTR/L , CBIS ascom [phone]  06/24/21, 4:31 PM

## 2021-06-24 NOTE — Progress Notes (Signed)
   06/24/21 0230  Clinical Encounter Type  Visited With Patient  Visit Type Initial;Spiritual support  Referral From Nurse;Patient  Consult/Referral To Chaplain   Chaplain responded to a spiritual consult for prayer. Chaplain ministered with prayer, compassionate presence, and reflective listening.   Andee Poles, M. Div.

## 2021-06-24 NOTE — Progress Notes (Signed)
Suzanne Dixon  ZRA:[id number] DOB: 1951-07-02 DOA: 06/19/2021 PCP: Adin Hector, MD    Brief Narrative:  70yo with a hx of A. fib, breast cancer, CVA, polyneuropathy, and lymphocytic leukemia, presents with altered mental status. She reports she had COVID 2 months prior. She endorses body aches and chills.  Denies any other symptoms.  She is unsure where she is. COVID test returned positive.  UA showed extremely concentrated urine and elevated pH.  Blood cultures were obtained.  CT chest abdomen and pelvis was obtained which showed no PE and no acute findings in the abdomen.  CT head without contrast was also unremarkable.  She was given a 1 L normal saline bolus, started on broad-spectrum antibiotics, and admitted for further management.  Consultants:  None  Code Status: FULL CODE  Date of Positive COVID Test:  06/19/2021 (last day of isolation 06/29/21)  COVID-19 specific Treatment: Remdesivir 10/1 > 10/5  Antimicrobials:  Cefepime 10/1 Flagyl 10/1 Vancomycin 10/1 Keflex 10/4 >  DVT prophylaxis: Warfarin  Subjective: Afebrile.  Vital signs stable.  Saturation 98% on room air.  Resting comfortably in the bedside chair.  No new complaints.  States she continues to feel weak in general.  Assessment & Plan:  Acute metabolic encephalopathy - acute delirium Felt to be due to dehydration related to COVID -resolved with fluid resuscitation  COVID infection Was dosed with Remdesivir earlier in hospital stay - last day on inpatient isolation is 06/29/21 (could be off isolation after 06/24/21 if outpatient)  Lactic acidosis Due to simple severe dehydration/volume depletion -resolved with IV fluid resuscitation  Elevated troponin Mild - no chest pain - no further evaluation indicated   Hypomagnesemia Due to poor oral intake -supplemented  Citrobacter in urine cx / UTI Has been dosed with antibiotics -presently asymptomatic  Gout Continue allopurinol  Parkinson's  disease Continue Sinemet  Chronic neuropathy Continue Cymbalta and gabapentin  Chronic bilateral lower extremity edema Titrate Lasix as appropriate based upon volume status and symptoms  Hypothyroidism Continue usual Synthroid  Atrial fibrillation Continue beta-blocker and warfarin  GERD   Family Communication: No family present at time of exam Status is: Inpatient  Remains inpatient appropriate because:Unsafe d/c plan  Dispo: The patient is from: Home              Anticipated d/c is to: SNF              Patient currently is medically stable to d/c.   Difficult to place patient No    Objective: Blood pressure (!) 101/52, pulse 67, temperature (!) 97.5 F (36.4 C), temperature source Oral, resp. rate 12, height 5\' 4"  (1.626 m), weight 113.3 kg, SpO2 98 %.  Intake/Output Summary (Last 24 hours) at 06/24/2021 1032 Last data filed at 06/24/2021 0556 Gross per 24 hour  Intake --  Output 700 ml  Net -700 ml    Filed Weights   06/19/21 1258  Weight: 113.3 kg    Examination: General: No acute respiratory distress Lungs: Clear to auscultation bilaterally without wheezes or crackles Cardiovascular: RRR without murmur or rub Abdomen: Nontender, nondistended, soft, bowel sounds positive, no rebound Extremities: No significant edema bilateral lower extremities  CBC: Recent Labs  Lab 06/21/21 0437 06/22/21 0429 06/23/21 0454  WBC 6.1 5.8 5.1  NEUTROABS 2.1 1.9 1.7  HGB 12.5 12.9 13.1  HCT 36.9 37.1 39.1  MCV 92.0 93.5 91.8  PLT 259 225 545    Basic Metabolic Panel: Recent Labs  Lab 06/20/21  4196 06/21/21 0437 06/22/21 0429 06/23/21 0454  NA 140 138 138  --   K 3.5 3.6 3.7 3.9  CL 106 105 106  --   CO2 28 28 27   --   GLUCOSE 93 88 87  --   BUN 9 13 15   --   CREATININE 0.75 0.65 0.69  --   CALCIUM 8.3* 8.2* 8.0*  --   MG 1.6* 1.8 1.6* 1.8  PHOS 3.9 3.7 2.8 3.1    GFR: Estimated Creatinine Clearance: 78.4 mL/min (by C-G formula based on SCr of 0.69  mg/dL).  Liver Function Tests: Recent Labs  Lab 06/19/21 1145 06/20/21 0446 06/21/21 0437 06/22/21 0429  AST 37 24 25 39  ALT 17 <5 <5 <5  ALKPHOS 68 60 55 62  BILITOT 1.2 0.9 0.6 0.5  PROT 7.1 6.0* 5.8* 5.7*  ALBUMIN 3.5 2.9* 2.8* 2.8*      Coagulation Profile: Recent Labs  Lab 06/20/21 0446 06/21/21 0437 06/22/21 0429 06/23/21 0454 06/24/21 0532  INR 1.4* 1.7* 1.7* 2.0* 2.5*      HbA1C: Hgb A1c MFr Bld  Date/Time Value Ref Range Status  02/02/2021 05:55 AM 5.7 (H) 4.8 - 5.6 % Final    Comment:    (NOTE) Pre diabetes:          5.7%-6.4%  Diabetes:              >6.4%  Glycemic control for   <7.0% adults with diabetes     Recent Results (from the past 240 hour(s))  Blood culture (single)     Status: Abnormal   Collection Time: 06/19/21 11:45 AM   Specimen: BLOOD  Result Value Ref Range Status   Specimen Description   Final    BLOOD BLOOD LEFT HAND Performed at [facility], [address] [place], [place] [address]    Special Requests   Final    BOTTLES DRAWN AEROBIC AND ANAEROBIC Blood Culture adequate volume Performed at [facility], [address] [place], [place] [address]    Culture  Setup Time   Final    GRAM POSITIVE RODS IN BOTH AEROBIC AND ANAEROBIC BOTTLES CRITICAL RESULT CALLED TO, READ BACK BY AND VERIFIED WITH: MORGAN HICKS AT 1927 06/20/21.PMF    Culture (A)  Final    DIPHTHEROIDS(CORYNEBACTERIUM SPECIES) Standardized susceptibility testing for this organism is not available. Performed at [facility], [place] [address] [place],  [address]    Report Status 06/23/2021 FINAL  Final  Resp Panel by RT-PCR (Flu A&B, Covid) Nasopharyngeal Swab     Status: Abnormal   Collection Time: 06/19/21 12:00 PM   Specimen: Nasopharyngeal Swab; Nasopharyngeal(NP) swabs in vial transport medium  Result Value Ref Range Status   SARS Coronavirus 2 by RT PCR POSITIVE (A) NEGATIVE Final    Comment: RESULT CALLED TO,  READ BACK BY AND VERIFIED WITH: C/RONNIE PFLUGER 06/19/21 1344 AMK (NOTE) SARS-CoV-2 target nucleic acids are DETECTED.  The SARS-CoV-2 RNA is generally detectable in upper respiratory specimens during the acute phase of infection. Positive results are indicative of the presence of the identified virus, but do not rule out bacterial infection or co-infection with other pathogens not detected by the test. Clinical correlation with patient history and other diagnostic information is necessary to determine patient infection status. The expected result is Negative.  Fact Sheet for Patients: EntrepreneurPulse.com.au  Fact Sheet for Healthcare Providers: IncredibleEmployment.be  This test is not yet approved or cleared by the [place] FDA  and  has been authorized for detection and/or diagnosis of SARS-CoV-2 by FDA under an Emergency Use Authorization (EUA).  This EUA will remain in effect (meaning this test can be  used) for the duration of  the COVID-19 declaration under Section 564(b)(1) of the Act, 21 U.S.C. section 360bbb-3(b)(1), unless the authorization is terminated or revoked sooner.     Influenza A by PCR NEGATIVE NEGATIVE Final   Influenza B by PCR NEGATIVE NEGATIVE Final    Comment: (NOTE) The Xpert Xpress SARS-CoV-2/FLU/RSV plus assay is intended as an aid in the diagnosis of influenza from Nasopharyngeal swab specimens and should not be used as a sole basis for treatment. Nasal washings and aspirates are unacceptable for Xpert Xpress SARS-CoV-2/FLU/RSV testing.  Fact Sheet for Patients: EntrepreneurPulse.com.au  Fact Sheet for Healthcare Providers: IncredibleEmployment.be  This test is not yet approved or cleared by the [place] FDA and has been authorized for detection and/or diagnosis of SARS-CoV-2 by FDA under an Emergency Use Authorization (EUA). This EUA will remain in effect  (meaning this test can be used) for the duration of the COVID-19 declaration under Section 564(b)(1) of the Act, 21 U.S.C. section 360bbb-3(b)(1), unless the authorization is terminated or revoked.  Performed at [facility], [address] [place], [place] [address]   Blood culture (single)     Status: None   Collection Time: 06/19/21  1:18 PM   Specimen: BLOOD  Result Value Ref Range Status   Specimen Description BLOOD LEFT ANTECUBITAL  Final   Special Requests   Final    BOTTLES DRAWN AEROBIC AND ANAEROBIC Blood Culture adequate volume   Culture   Final    NO GROWTH 5 DAYS Performed at [facility], [address] [place], [place] [address]    Report Status 06/24/2021 FINAL  Final  Urine Culture     Status: Abnormal   Collection Time: 06/19/21  2:30 PM   Specimen: Urine, Clean Catch  Result Value Ref Range Status   Specimen Description   Final    URINE, CLEAN CATCH Performed at [facility], [address] [place], [place] [address]    Special Requests   Final    NONE Performed at [facility], [address] [place], [place] [address]    Culture >=100,000 COLONIES/mL CITROBACTER KOSERI (A)  Final   Report Status 06/22/2021 FINAL  Final   Organism ID, Bacteria CITROBACTER KOSERI (A)  Final      Susceptibility   Citrobacter koseri - MIC*    CEFAZOLIN <=4 SENSITIVE Sensitive     CEFEPIME <=0.12 SENSITIVE Sensitive     CEFTRIAXONE <=0.25 SENSITIVE Sensitive     CIPROFLOXACIN <=0.25 SENSITIVE Sensitive     GENTAMICIN <=1 SENSITIVE Sensitive     IMIPENEM <=0.25 SENSITIVE Sensitive     NITROFURANTOIN 32 SENSITIVE Sensitive     TRIMETH/SULFA <=20 SENSITIVE Sensitive     PIP/TAZO <=4 SENSITIVE Sensitive     * >=100,000 COLONIES/mL CITROBACTER KOSERI     Scheduled Meds:  acidophilus  2 capsule Oral TID   allopurinol  300 mg Oral Daily   carbidopa-levodopa  1.5 tablet Oral TID   cephALEXin  500 mg Oral Q8H   DULoxetine  60 mg  Oral BID   furosemide  20 mg Oral Daily   gabapentin  600 mg Oral QID   levothyroxine  50 mcg Oral Q0600   meloxicam  15 mg Oral Daily   metoprolol tartrate  200 mg Oral BID  oxybutynin  5 mg Oral BID   pantoprazole  40 mg Oral Daily   potassium chloride  10 mEq Oral TID   sodium chloride flush  3 mL Intravenous Q12H   vitamin B-12  1,000 mcg Oral Daily   warfarin  7 mg Oral ONCE-1600   Warfarin - Pharmacist Dosing Inpatient   Does not apply q1600     LOS: 5 days   Cherene Altes, MD [facility] Office  [phone] Pager - Text Page per Shea Evans  If 7PM-7AM, please contact night-coverage per Amion 06/24/2021, 10:32 AM

## 2021-06-24 NOTE — Consult Note (Signed)
[place] for warfarin  Indication: atrial fibrillation  Allergies  Allergen Reactions   Advair Diskus [Fluticasone-Salmeterol] Other (See Comments)    Joint pain    Alendronate     Muscle pain   Singulair [Montelukast] Other (See Comments)    "muscle pain"   Sulfa Antibiotics Hives   Venlafaxine Other (See Comments)    "altered mental status/tremors"    Patient Measurements: Height: 5\' 4"  (162.6 cm) Weight: 113.3 kg (249 lb 12.8 oz) IBW/kg (Calculated) : 54.7  Vital Signs: Temp: 97.5 F (36.4 C) (10/06 0552) Temp Source: Oral (10/06 0552) BP: 101/52 (10/06 0552) Pulse Rate: 67 (10/06 0552)  Labs: Recent Labs    06/22/21 0429 06/23/21 0454 06/24/21 0532  HGB 12.9 13.1  --   HCT 37.1 39.1  --   PLT 225 248  --   LABPROT 20.2* 22.5* 27.0*  INR 1.7* 2.0* 2.5*  CREATININE 0.69  --   --      Estimated Creatinine Clearance: 78.4 mL/min (by C-G formula based on SCr of 0.69 mg/dL).   Medical History: Past Medical History:  Diagnosis Date   A-fib ([place])    Anemia    B12 deficiency    Breast cancer ([place]) 03/31/2013   Right - chemo- mastectomy   Breast cancer ([place]) 1991   Rt.- radiation   Complication of anesthesia 01/2009   Recent years with general anesthesia had itching following surgery   DDD (degenerative disc disease), cervical    Depression    Diverticulitis    Dyspnea    Edema of both legs    GERD (gastroesophageal reflux disease)    Gout    H/O mastectomy 2014   per patient   H/O: cesarean section 1987   per patient report   H/O: hysterectomy 1996   per patient   Hydradenitis    Hypertension    Hypothyroidism    Neuropathy    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 female with hx of A. fib, breast cancer, CVA, polyneuropathy, lymphocytic leukemia, presents with altered mental status and found to be COVID positive. Pharmacy has been consulted for warfarin dosing for A. Fib with Lovenox bridge for subtherapeutic INR on admission.  Baseline labs: INR 1.4; H/H and plts WNL; D-dimer 1.02  DDIs:  Warfarin + levothyroxine >> may increase INR Warfarin + meloxicam >> may increase risk of bleeding.  Date INR Dose 9/30 -- 7 mg  10/1 1.4 10 mg 10/2 1.4 7.5 mg 10/3 1.7 7.5 mg 10/4 1.7 10 mg 10/5 2  7  mg 10/6 2.5  7 mg   Goal of Therapy:  INR 2-3 Monitor platelets by anticoagulation protocol: Yes   Plan:  INR is therapeutic. Will give warfarin 7 mg x 1 (home regimen). Daily INR ordered. CBC at least every 3 days.   Oswald Hillock, PharmD Clinical Pharmacist 06/24/2021,8:43 AM

## 2021-06-24 NOTE — TOC Progression Note (Addendum)
Transition of Care [facility]) - Progression Note    Patient Details  Name: AANYAH LOA MRN: [id number] Date of Birth: Sep 10, 1951  [facility]) CM/SW Contact  Candie Chroman, LCSW Phone Number: 06/24/2021, 8:53 AM  Clinical Narrative:  PASARR under level 2 review. Received call from daughter Estill Bamberg. Provided update about [employer]. She is agreeable.  Expected Discharge Plan: [place] Barriers to Discharge: Continued Medical Work up  Expected Discharge Plan and Services Expected Discharge Plan: [place] Choice: Resumption of Svcs/PTA Provider Living arrangements for the past 2 months: Single Family Home                           HH Arranged: RN, PT, OT, Nurse's Aide, Social Work [employer] Agency: [place] Date [place]: 06/21/21   Representative spoke with at [place]: [place] [employer]   Social Determinants of Health ([place]) Interventions    Readmission Risk Interventions Readmission Risk Prevention Plan 06/21/2021  Transportation Screening Complete  PCP or Specialist Appt within 3-5 Days Complete  HRI or [place] Complete  Social Work Consult for Foster Planning/Counseling Complete  Palliative Care Screening Not Applicable  Medication Review [employer]) Complete  Some recent data might be hidden

## 2021-06-25 DIAGNOSIS — E872 Acidosis, unspecified: Secondary | ICD-10-CM | POA: Diagnosis not present

## 2021-06-25 DIAGNOSIS — U071 COVID-19: Secondary | ICD-10-CM | POA: Diagnosis not present

## 2021-06-25 DIAGNOSIS — I480 Paroxysmal atrial fibrillation: Secondary | ICD-10-CM | POA: Diagnosis not present

## 2021-06-25 LAB — PROTIME-INR
INR: 3.1 — ABNORMAL HIGH (ref 0.8–1.2)
Prothrombin Time: 31.6 seconds — ABNORMAL HIGH (ref 11.4–15.2)

## 2021-06-25 LAB — BASIC METABOLIC PANEL
Anion gap: 7 (ref 5–15)
BUN: 16 mg/dL (ref 8–23)
CO2: 30 mmol/L (ref 22–32)
Calcium: 8.6 mg/dL — ABNORMAL LOW (ref 8.9–10.3)
Chloride: 101 mmol/L (ref 98–111)
Creatinine, Ser: 0.74 mg/dL (ref 0.44–1.00)
GFR, Estimated: >60 mL/min (ref >60)
Glucose, Bld: 93 mg/dL (ref 70–99)
Potassium: 4.5 mmol/L (ref 3.5–5.1)
Sodium: 138 mmol/L (ref 135–145)

## 2021-06-25 NOTE — Progress Notes (Signed)
Suzanne Dixon  SPQ:[id number] DOB: 03-12-1951 DOA: 06/19/2021 PCP: Adin Hector, MD    Brief Narrative:  70yo with a hx of A. fib, breast cancer, CVA, polyneuropathy, and lymphocytic leukemia, who presented with altered mental status. She reports she had COVID 2 months prior. She endorses body aches and chills.  Denies any other symptoms.  She is unsure where she is. COVID test returned positive.  UA showed extremely concentrated urine and elevated pH.  Blood cultures were obtained.  CT chest abdomen and pelvis was obtained which showed no PE and no acute findings in the abdomen.  CT head without contrast was also unremarkable.  She was given a 1 L normal saline bolus, started on broad-spectrum antibiotics, and admitted for further management.  Consultants:  None  Code Status: FULL CODE  Date of Positive COVID Test:  06/19/2021 (last day of isolation 06/29/21)  COVID-19 specific Treatment: Remdesivir 10/1 > 10/5  Antimicrobials:  Cefepime 10/1 Flagyl 10/1 Vancomycin 10/1 Keflex 10/4 >  DVT prophylaxis: Warfarin  Subjective: No new events noted overnight.  Afebrile.  Vital signs stable.  Reports some tooth pain which is contributing to a headache.  She states she had to have a new cath placed shortly before being admitted to the hospital.  No shortness of breath.  No chest pain nausea vomiting or abdominal pain.  Assessment & Plan:  Acute metabolic encephalopathy - acute delirium Felt to be due to dehydration related to COVID -resolved with fluid resuscitation  COVID infection Was dosed with Remdesivir earlier in hospital stay - last day on inpatient isolation is 06/29/21 (could be off isolation after 06/24/21 if outpatient)  Lactic acidosis Due to simple severe dehydration/volume depletion -resolved with IV fluid resuscitation  Elevated troponin Mild - no chest pain - no further evaluation indicated   Headache / dental pain Use simple analgesia -if persist will  consider orthopantogram  Hypomagnesemia Due to poor oral intake -supplemented  Citrobacter UTI Has been dosed with antibiotics -presently asymptomatic  Gout Continue allopurinol  Parkinson's disease Continue Sinemet  Chronic neuropathy Continue Cymbalta and gabapentin  Chronic bilateral lower extremity edema Titrate Lasix as appropriate based upon volume status and symptoms  Hypothyroidism Continue usual Synthroid  Atrial fibrillation Continue beta-blocker and warfarin  GERD   Family Communication: No family present at time of exam Status is: Inpatient  Remains inpatient appropriate because:Unsafe d/c plan  Dispo: The patient is from: Home              Anticipated d/c is to: SNF              Patient currently is medically stable to d/c.   Difficult to place patient No    Objective: Blood pressure 108/64, pulse 92, temperature 97.7 F (36.5 C), temperature source Oral, resp. rate 18, height 5\' 4"  (1.626 m), weight 113.3 kg, SpO2 100 %.  Intake/Output Summary (Last 24 hours) at 06/25/2021 0948 Last data filed at 06/25/2021 0537 Gross per 24 hour  Intake --  Output 900 ml  Net -900 ml    Filed Weights   06/19/21 1258  Weight: 113.3 kg    Examination: General: No acute respiratory distress Lungs: Clear to auscultation bilaterally  Cardiovascular: RRR without murmur  Abdomen: Nontender, nondistended, soft, bowel sounds positive, no rebound Extremities: No significant edema BLE  CBC: Recent Labs  Lab 06/21/21 0437 06/22/21 0429 06/23/21 0454  WBC 6.1 5.8 5.1  NEUTROABS 2.1 1.9 1.7  HGB 12.5 12.9 13.1  HCT 36.9 37.1 39.1  MCV 92.0 93.5 91.8  PLT 259 225 948    Basic Metabolic Panel: Recent Labs  Lab 06/21/21 0437 06/22/21 0429 06/23/21 0454 06/25/21 0636  NA 138 138  --  138  K 3.6 3.7 3.9 4.5  CL 105 106  --  101  CO2 28 27  --  30  GLUCOSE 88 87  --  93  BUN 13 15  --  16  CREATININE 0.65 0.69  --  0.74  CALCIUM 8.2* 8.0*  --  8.6*   MG 1.8 1.6* 1.8  --   PHOS 3.7 2.8 3.1  --     GFR: Estimated Creatinine Clearance: 78.4 mL/min (by C-G formula based on SCr of 0.74 mg/dL).  Liver Function Tests: Recent Labs  Lab 06/19/21 1145 06/20/21 0446 06/21/21 0437 06/22/21 0429  AST 37 24 25 39  ALT 17 <5 <5 <5  ALKPHOS 68 60 55 62  BILITOT 1.2 0.9 0.6 0.5  PROT 7.1 6.0* 5.8* 5.7*  ALBUMIN 3.5 2.9* 2.8* 2.8*      Coagulation Profile: Recent Labs  Lab 06/21/21 0437 06/22/21 0429 06/23/21 0454 06/24/21 0532 06/25/21 0636  INR 1.7* 1.7* 2.0* 2.5* 3.1*      HbA1C: Hgb A1c MFr Bld  Date/Time Value Ref Range Status  02/02/2021 05:55 AM 5.7 (H) 4.8 - 5.6 % Final    Comment:    (NOTE) Pre diabetes:          5.7%-6.4%  Diabetes:              >6.4%  Glycemic control for   <7.0% adults with diabetes     Recent Results (from the past 240 hour(s))  Blood culture (single)     Status: Abnormal   Collection Time: 06/19/21 11:45 AM   Specimen: BLOOD  Result Value Ref Range Status   Specimen Description   Final    BLOOD BLOOD LEFT HAND Performed at [facility], [address], [place], [place] [address]    Special Requests   Final    BOTTLES DRAWN AEROBIC AND ANAEROBIC Blood Culture adequate volume Performed at [facility], [address] [place], [place] [address]    Culture  Setup Time   Final    GRAM POSITIVE RODS IN BOTH AEROBIC AND ANAEROBIC BOTTLES CRITICAL RESULT CALLED TO, READ BACK BY AND VERIFIED WITH: MORGAN HICKS AT 1927 06/20/21.PMF    Culture (A)  Final    DIPHTHEROIDS(CORYNEBACTERIUM SPECIES) Standardized susceptibility testing for this organism is not available. Performed at [facility], [place] [address] [place], [place] [address]    Report Status 06/23/2021 FINAL  Final  Resp Panel by RT-PCR (Flu A&B, Covid) Nasopharyngeal Swab     Status: Abnormal   Collection Time: 06/19/21 12:00 PM   Specimen: Nasopharyngeal Swab; Nasopharyngeal(NP) swabs in vial  transport medium  Result Value Ref Range Status   SARS Coronavirus 2 by RT PCR POSITIVE (A) NEGATIVE Final    Comment: RESULT CALLED TO, READ BACK BY AND VERIFIED WITH: C/RONNIE PFLUGER 06/19/21 1344 AMK (NOTE) SARS-CoV-2 target nucleic acids are DETECTED.  The SARS-CoV-2 RNA is generally detectable in upper respiratory specimens during the acute phase of infection. Positive results are indicative of the presence of the identified virus, but do not rule out bacterial infection or co-infection with other pathogens not detected by the test. Clinical correlation with patient history and other diagnostic information is necessary to determine patient infection status. The expected result  is Negative.  Fact Sheet for Patients: EntrepreneurPulse.com.au  Fact Sheet for Healthcare Providers: IncredibleEmployment.be  This test is not yet approved or cleared by the [place] FDA and  has been authorized for detection and/or diagnosis of SARS-CoV-2 by FDA under an Emergency Use Authorization (EUA).  This EUA will remain in effect (meaning this test can be  used) for the duration of  the COVID-19 declaration under Section 564(b)(1) of the Act, 21 U.S.C. section 360bbb-3(b)(1), unless the authorization is terminated or revoked sooner.     Influenza A by PCR NEGATIVE NEGATIVE Final   Influenza B by PCR NEGATIVE NEGATIVE Final    Comment: (NOTE) The Xpert Xpress SARS-CoV-2/FLU/RSV plus assay is intended as an aid in the diagnosis of influenza from Nasopharyngeal swab specimens and should not be used as a sole basis for treatment. Nasal washings and aspirates are unacceptable for Xpert Xpress SARS-CoV-2/FLU/RSV testing.  Fact Sheet for Patients: EntrepreneurPulse.com.au  Fact Sheet for Healthcare Providers: IncredibleEmployment.be  This test is not yet approved or cleared by the [place] FDA and has been  authorized for detection and/or diagnosis of SARS-CoV-2 by FDA under an Emergency Use Authorization (EUA). This EUA will remain in effect (meaning this test can be used) for the duration of the COVID-19 declaration under Section 564(b)(1) of the Act, 21 U.S.C. section 360bbb-3(b)(1), unless the authorization is terminated or revoked.  Performed at [facility], [address] [place], [place] [address]   Blood culture (single)     Status: None   Collection Time: 06/19/21  1:18 PM   Specimen: BLOOD  Result Value Ref Range Status   Specimen Description BLOOD LEFT ANTECUBITAL  Final   Special Requests   Final    BOTTLES DRAWN AEROBIC AND ANAEROBIC Blood Culture adequate volume   Culture   Final    NO GROWTH 5 DAYS Performed at [facility], [address] [place], [place] [address]    Report Status 06/24/2021 FINAL  Final  Urine Culture     Status: Abnormal   Collection Time: 06/19/21  2:30 PM   Specimen: Urine, Clean Catch  Result Value Ref Range Status   Specimen Description   Final    URINE, CLEAN CATCH Performed at [facility], [address] [place], [place] [address]    Special Requests   Final    NONE Performed at [facility], [place], [place] [address]    Culture >=100,000 COLONIES/mL CITROBACTER KOSERI (A)  Final   Report Status 06/22/2021 FINAL  Final   Organism ID, Bacteria CITROBACTER KOSERI (A)  Final      Susceptibility   Citrobacter koseri - MIC*    CEFAZOLIN <=4 SENSITIVE Sensitive     CEFEPIME <=0.12 SENSITIVE Sensitive     CEFTRIAXONE <=0.25 SENSITIVE Sensitive     CIPROFLOXACIN <=0.25 SENSITIVE Sensitive     GENTAMICIN <=1 SENSITIVE Sensitive     IMIPENEM <=0.25 SENSITIVE Sensitive     NITROFURANTOIN 32 SENSITIVE Sensitive     TRIMETH/SULFA <=20 SENSITIVE Sensitive     PIP/TAZO <=4 SENSITIVE Sensitive     * >=100,000 COLONIES/mL CITROBACTER KOSERI     Scheduled Meds:  acidophilus  2  capsule Oral TID   allopurinol  300 mg Oral Daily   carbidopa-levodopa  1.5 tablet Oral TID   DULoxetine  60 mg Oral BID   furosemide  20 mg Oral Daily   gabapentin  600 mg Oral QID   levothyroxine  50 mcg  Oral Q0600   meloxicam  15 mg Oral Daily   metoprolol tartrate  200 mg Oral BID   oxybutynin  5 mg Oral BID   pantoprazole  40 mg Oral Daily   potassium chloride  10 mEq Oral TID   sodium chloride flush  3 mL Intravenous Q12H   vitamin B-12  1,000 mcg Oral Daily   Warfarin - Pharmacist Dosing Inpatient   Does not apply q1600     LOS: 6 days   Cherene Altes, MD [facility] Office  [phone] Pager - Text Page per Shea Evans  If 7PM-7AM, please contact night-coverage per Amion 06/25/2021, 9:48 AM

## 2021-06-25 NOTE — TOC Progression Note (Addendum)
Transition of Care [facility]) - Progression Note    Patient Details  Name: Suzanne Dixon MRN: [id number] Date of Birth: [birth year]  Transition of [facility]) CM/SW [place], LCSW Phone Number: 06/25/2021, 9:13 AM  Clinical Narrative:   [employer] admissions coordinator will start insurance authorization today for admission on Wednesday 10/12.  2:46 pm: Received call from [facility] Must representative. Made her aware that patient is on COVID isolation precautions so they will need to call patient in the room for their PASARR assessment.  Expected Discharge Plan: [place] Barriers to Discharge: Continued Medical Work up  Expected Discharge Plan and Services Expected Discharge Plan: [place] Choice: Resumption of Svcs/PTA Provider Living arrangements for the past 2 months: Single Family Home                           HH Arranged: RN, PT, OT, Nurse's Aide, Social Work [employer] Agency: [place] Date [place]: 06/21/21   Representative spoke with at [place]: [place] [employer]   Social Determinants of Health ([place]) Interventions    Readmission Risk Interventions Readmission Risk Prevention Plan 06/21/2021  Transportation Screening Complete  PCP or Specialist Appt within 3-5 Days Complete  HRI or [place] Complete  Social Work Consult for [place] Planning/Counseling Complete  Palliative Care Screening Not Applicable  Medication Review Press photographer) Complete  Some recent data might be hidden

## 2021-06-25 NOTE — Consult Note (Signed)
[place] for warfarin  Indication: atrial fibrillation  Allergies  Allergen Reactions   Advair Diskus [Fluticasone-Salmeterol] Other (See Comments)    Joint pain    Alendronate     Muscle pain   Singulair [Montelukast] Other (See Comments)    "muscle pain"   Sulfa Antibiotics Hives   Venlafaxine Other (See Comments)    "altered mental status/tremors"    Patient Measurements: Height: 5\' 4"  (162.6 cm) Weight: 113.3 kg (249 lb 12.8 oz) IBW/kg (Calculated) : 54.7  Vital Signs: Temp: 97.5 F (36.4 C) (10/07 0521) Temp Source: Oral (10/07 0521) BP: 103/64 (10/07 0521) Pulse Rate: 76 (10/07 0521)  Labs: Recent Labs    06/23/21 0454 06/24/21 0532 06/25/21 0636  HGB 13.1  --   --   HCT 39.1  --   --   PLT 248  --   --   LABPROT 22.5* 27.0* 31.6*  INR 2.0* 2.5* 3.1*  CREATININE  --   --  0.74     Estimated Creatinine Clearance: 78.4 mL/min (by C-G formula based on SCr of 0.74 mg/dL).   Medical History: Past Medical History:  Diagnosis Date   A-fib ([place])    Anemia    B12 deficiency    Breast cancer ([place]) 03/31/2013   Right - chemo- mastectomy   Breast cancer ([place]) 1991   Rt.- radiation   Complication of anesthesia 01/2009   Recent years with general anesthesia had itching following surgery   DDD (degenerative disc disease), cervical    Depression    Diverticulitis    Dyspnea    Edema of both legs    GERD (gastroesophageal reflux disease)    Gout    H/O mastectomy 2014   per patient   H/O: cesarean section 1987   per patient report   H/O: hysterectomy 1996   per patient   Hydradenitis    Hypertension    Hypothyroidism    Neuropathy    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 female with hx of A. fib, breast cancer, CVA, polyneuropathy, lymphocytic leukemia, presents with altered mental status and found to be COVID positive. Pharmacy has been consulted for warfarin dosing for A. Fib with Lovenox bridge for subtherapeutic INR on admission.  Baseline labs: INR 1.4; H/H and plts WNL; D-dimer 1.02  DDIs:  Warfarin + levothyroxine >> may increase INR Warfarin + meloxicam >> may increase risk of bleeding.  Date INR Dose 9/30 -- 7 mg  10/1 1.4 10 mg 10/2 1.4 7.5 mg 10/3 1.7 7.5 mg 10/4 1.7 10 mg 10/5 2  7  mg 10/6 2.5  7 mg  10/7 3.1  HOLD  Goal of Therapy:  INR 2-3 Monitor platelets by anticoagulation protocol: Yes   Plan:  INR is slightly supratherapeutic. Will hold warfarin dose and follow INR trend. If starts to trend down can restart home regimen. Pt was started on keflex which can increase INR, but has now completed the course. Daily INR ordered. CBC at least every 3 days.   Oswald Hillock, PharmD Clinical Pharmacist  06/25/2021,8:03 AM

## 2021-06-26 DIAGNOSIS — U071 COVID-19: Secondary | ICD-10-CM | POA: Diagnosis not present

## 2021-06-26 DIAGNOSIS — E872 Acidosis, unspecified: Secondary | ICD-10-CM | POA: Diagnosis not present

## 2021-06-26 DIAGNOSIS — I480 Paroxysmal atrial fibrillation: Secondary | ICD-10-CM | POA: Diagnosis not present

## 2021-06-26 LAB — CBC
HCT: 39.6 % (ref 36.0–46.0)
Hemoglobin: 13.1 g/dL (ref 12.0–15.0)
MCH: 31 pg (ref 26.0–34.0)
MCHC: 33.1 g/dL (ref 30.0–36.0)
MCV: 93.6 fL (ref 80.0–100.0)
Platelets: 218 10*3/uL (ref 150–400)
RBC: 4.23 MIL/uL (ref 3.87–5.11)
RDW: 14.4 % (ref 11.5–15.5)
WBC: 5.8 10*3/uL (ref 4.0–10.5)
nRBC: 0 % (ref 0.0–0.2)

## 2021-06-26 LAB — PROTIME-INR
INR: 3.3 — ABNORMAL HIGH (ref 0.8–1.2)
Prothrombin Time: 33.6 seconds — ABNORMAL HIGH (ref 11.4–15.2)

## 2021-06-26 MED ORDER — SODIUM CHLORIDE 0.9 % IV BOLUS
250.0000 mL | Freq: Once | INTRAVENOUS | Status: AC
  Administered 2021-06-26: 250 mL via INTRAVENOUS

## 2021-06-26 MED ORDER — SODIUM CHLORIDE 0.9 % IV SOLN: INTRAVENOUS | Status: DC

## 2021-06-26 NOTE — Progress Notes (Signed)
Suzanne Dixon  MWU:[id number] DOB: Nov 17, 1950 DOA: 06/19/2021 PCP: Adin Hector, MD    Brief Narrative:  70yo with a hx of A. fib, breast cancer, CVA, polyneuropathy, and lymphocytic leukemia, who presented with altered mental status. She reports she had COVID 2 months prior. She endorses body aches and chills.  Denies any other symptoms.  She is unsure where she is. COVID test returned positive.  UA showed extremely concentrated urine and elevated pH.  Blood cultures were obtained.  CT chest abdomen and pelvis was obtained which showed no PE and no acute findings in the abdomen.  CT head without contrast was also unremarkable.  She was given a 1 L normal saline bolus, started on broad-spectrum antibiotics, and admitted for further management.  Consultants:  None  Code Status: FULL CODE  Date of Positive COVID Test:  06/19/2021 (last day of isolation 06/29/21)  COVID-19 specific Treatment: Remdesivir 10/1 > 10/5  Antimicrobials:  Cefepime 10/1 Flagyl 10/1 Vancomycin 10/1 Keflex 10/4 >  DVT prophylaxis: Warfarin  Subjective: Afebrile.  Vital signs stable.  Headache is improved and dental pain while still present is not as severe per her report.  No other complaints today.  Assessment & Plan:  Headache / dental pain Use simple analgesia -if persist will consider orthopantogram but appears to be improving as of today  Acute metabolic encephalopathy - acute delirium Felt to be due to dehydration related to COVID -resolved with fluid resuscitation  COVID infection Was dosed with Remdesivir earlier in hospital stay - last day on inpatient isolation is 06/29/21 (could be off isolation after 06/24/21 if outpatient)  Lactic acidosis Due to simple severe dehydration/volume depletion - resolved with IV fluid resuscitation  Elevated troponin Mild - no chest pain - no further evaluation indicated   Hypomagnesemia Due to poor oral intake -supplemented  Citrobacter UTI Has been  dosed with antibiotics -presently asymptomatic  Gout Continue allopurinol  Parkinson's disease Continue Sinemet  Chronic neuropathy Continue Cymbalta and gabapentin  Chronic bilateral lower extremity edema Titrate Lasix as appropriate based upon volume status and symptoms  Hypothyroidism Continue usual Synthroid  Atrial fibrillation Continue beta-blocker and warfarin  GERD   Family Communication: No family present at time of exam Status is: Inpatient  Remains inpatient appropriate because:Unsafe d/c plan  Dispo: The patient is from: Home              Anticipated d/c is to: SNF              Patient currently is medically stable to d/c.   Difficult to place patient No    Objective: Blood pressure 111/87, pulse 92, temperature (!) 97.4 F (36.3 C), temperature source Oral, resp. rate 18, height 5\' 4"  (1.626 m), weight 113.3 kg, SpO2 100 %.  Intake/Output Summary (Last 24 hours) at 06/26/2021 0945 Last data filed at 06/25/2021 1800 Gross per 24 hour  Intake 240 ml  Output 550 ml  Net -310 ml    Filed Weights   06/19/21 1258  Weight: 113.3 kg    Examination: General: No acute respiratory distress Lungs: Clear to auscultation bilaterally without wheezing Cardiovascular: RRR without murmur  Abdomen: NT/ND, soft, BS positive Extremities: No significant edema BLE  CBC: Recent Labs  Lab 06/21/21 0437 06/22/21 0429 06/23/21 0454 06/26/21 0433  WBC 6.1 5.8 5.1 5.8  NEUTROABS 2.1 1.9 1.7  --   HGB 12.5 12.9 13.1 13.1  HCT 36.9 37.1 39.1 39.6  MCV 92.0 93.5 91.8 93.6  PLT 259 225 248 026    Basic Metabolic Panel: Recent Labs  Lab 06/21/21 0437 06/22/21 0429 06/23/21 0454 06/25/21 0636  NA 138 138  --  138  K 3.6 3.7 3.9 4.5  CL 105 106  --  101  CO2 28 27  --  30  GLUCOSE 88 87  --  93  BUN 13 15  --  16  CREATININE 0.65 0.69  --  0.74  CALCIUM 8.2* 8.0*  --  8.6*  MG 1.8 1.6* 1.8  --   PHOS 3.7 2.8 3.1  --     GFR: Estimated Creatinine  Clearance: 78.4 mL/min (by C-G formula based on SCr of 0.74 mg/dL).  Liver Function Tests: Recent Labs  Lab 06/19/21 1145 06/20/21 0446 06/21/21 0437 06/22/21 0429  AST 37 24 25 39  ALT 17 <5 <5 <5  ALKPHOS 68 60 55 62  BILITOT 1.2 0.9 0.6 0.5  PROT 7.1 6.0* 5.8* 5.7*  ALBUMIN 3.5 2.9* 2.8* 2.8*      Coagulation Profile: Recent Labs  Lab 06/22/21 0429 06/23/21 0454 06/24/21 0532 06/25/21 0636 06/26/21 0433  INR 1.7* 2.0* 2.5* 3.1* 3.3*      HbA1C: Hgb A1c MFr Bld  Date/Time Value Ref Range Status  02/02/2021 05:55 AM 5.7 (H) 4.8 - 5.6 % Final    Comment:    (NOTE) Pre diabetes:          5.7%-6.4%  Diabetes:              >6.4%  Glycemic control for   <7.0% adults with diabetes     Recent Results (from the past 240 hour(s))  Blood culture (single)     Status: Abnormal   Collection Time: 06/19/21 11:45 AM   Specimen: BLOOD  Result Value Ref Range Status   Specimen Description   Final    BLOOD BLOOD LEFT HAND Performed at [facility], [address] [place], [place] [address]    Special Requests   Final    BOTTLES DRAWN AEROBIC AND ANAEROBIC Blood Culture adequate volume Performed at [facility], [address] [place], [place] [address]    Culture  Setup Time   Final    GRAM POSITIVE RODS IN BOTH AEROBIC AND ANAEROBIC BOTTLES CRITICAL RESULT CALLED TO, READ BACK BY AND VERIFIED WITH: MORGAN HICKS AT 1927 06/20/21.PMF    Culture (A)  Final    DIPHTHEROIDS(CORYNEBACTERIUM SPECIES) Standardized susceptibility testing for this organism is not available. Performed at [facility], [place] [address], [place], [place] [address]    Report Status 06/23/2021 FINAL  Final  Resp Panel by RT-PCR (Flu A&B, Covid) Nasopharyngeal Swab     Status: Abnormal   Collection Time: 06/19/21 12:00 PM   Specimen: Nasopharyngeal Swab; Nasopharyngeal(NP) swabs in vial transport medium  Result Value Ref Range Status   SARS Coronavirus 2 by RT PCR  POSITIVE (A) NEGATIVE Final    Comment: RESULT CALLED TO, READ BACK BY AND VERIFIED WITH: C/RONNIE PFLUGER 06/19/21 1344 AMK (NOTE) SARS-CoV-2 target nucleic acids are DETECTED.  The SARS-CoV-2 RNA is generally detectable in upper respiratory specimens during the acute phase of infection. Positive results are indicative of the presence of the identified virus, but do not rule out bacterial infection or co-infection with other pathogens not detected by the test. Clinical correlation with patient history and other diagnostic information is necessary to determine patient infection status. The expected result is Negative.  Fact Sheet for Patients: EntrepreneurPulse.com.au  Fact Sheet for Healthcare Providers: IncredibleEmployment.be  This test is not yet approved or cleared by the [place] FDA and  has been authorized for detection and/or diagnosis of SARS-CoV-2 by FDA under an Emergency Use Authorization (EUA).  This EUA will remain in effect (meaning this test can be  used) for the duration of  the COVID-19 declaration under Section 564(b)(1) of the Act, 21 U.S.C. section 360bbb-3(b)(1), unless the authorization is terminated or revoked sooner.     Influenza A by PCR NEGATIVE NEGATIVE Final   Influenza B by PCR NEGATIVE NEGATIVE Final    Comment: (NOTE) The Xpert Xpress SARS-CoV-2/FLU/RSV plus assay is intended as an aid in the diagnosis of influenza from Nasopharyngeal swab specimens and should not be used as a sole basis for treatment. Nasal washings and aspirates are unacceptable for Xpert Xpress SARS-CoV-2/FLU/RSV testing.  Fact Sheet for Patients: EntrepreneurPulse.com.au  Fact Sheet for Healthcare Providers: IncredibleEmployment.be  This test is not yet approved or cleared by the [place] FDA and has been authorized for detection and/or diagnosis of SARS-CoV-2 by FDA under an Emergency Use  Authorization (EUA). This EUA will remain in effect (meaning this test can be used) for the duration of the COVID-19 declaration under Section 564(b)(1) of the Act, 21 U.S.C. section 360bbb-3(b)(1), unless the authorization is terminated or revoked.  Performed at [facility], [address] [place], [place] [address]   Blood culture (single)     Status: None   Collection Time: 06/19/21  1:18 PM   Specimen: BLOOD  Result Value Ref Range Status   Specimen Description BLOOD LEFT ANTECUBITAL  Final   Special Requests   Final    BOTTLES DRAWN AEROBIC AND ANAEROBIC Blood Culture adequate volume   Culture   Final    NO GROWTH 5 DAYS Performed at [facility], [address] [place], [place] [address]    Report Status 06/24/2021 FINAL  Final  Urine Culture     Status: Abnormal   Collection Time: 06/19/21  2:30 PM   Specimen: Urine, Clean Catch  Result Value Ref Range Status   Specimen Description   Final    URINE, CLEAN CATCH Performed at [facility], [address], [place], [place] [address]    Special Requests   Final    NONE Performed at [facility], [address], [place], [place] [address]    Culture >=100,000 COLONIES/mL CITROBACTER KOSERI (A)  Final   Report Status 06/22/2021 FINAL  Final   Organism ID, Bacteria CITROBACTER KOSERI (A)  Final      Susceptibility   Citrobacter koseri - MIC*    CEFAZOLIN <=4 SENSITIVE Sensitive     CEFEPIME <=0.12 SENSITIVE Sensitive     CEFTRIAXONE <=0.25 SENSITIVE Sensitive     CIPROFLOXACIN <=0.25 SENSITIVE Sensitive     GENTAMICIN <=1 SENSITIVE Sensitive     IMIPENEM <=0.25 SENSITIVE Sensitive     NITROFURANTOIN 32 SENSITIVE Sensitive     TRIMETH/SULFA <=20 SENSITIVE Sensitive     PIP/TAZO <=4 SENSITIVE Sensitive     * >=100,000 COLONIES/mL CITROBACTER KOSERI     Scheduled Meds:  acidophilus  2 capsule Oral TID   allopurinol  300 mg Oral Daily   carbidopa-levodopa  1.5 tablet Oral TID    DULoxetine  60 mg Oral BID   furosemide  20 mg Oral Daily   gabapentin  600 mg Oral QID   levothyroxine  50 mcg Oral Q0600   meloxicam  15 mg Oral  Daily   metoprolol tartrate  200 mg Oral BID   oxybutynin  5 mg Oral BID   pantoprazole  40 mg Oral Daily   potassium chloride  10 mEq Oral TID   sodium chloride flush  3 mL Intravenous Q12H   vitamin B-12  1,000 mcg Oral Daily   Warfarin - Pharmacist Dosing Inpatient   Does not apply q1600     LOS: 7 days   Cherene Altes, MD [facility] Office  [phone] Pager - Text Page per Shea Evans  If 7PM-7AM, please contact night-coverage per Amion 06/26/2021, 9:45 AM

## 2021-06-26 NOTE — Progress Notes (Signed)
   06/26/21 1843  Assess: MEWS Score  BP (!) 84/53  Pulse Rate 82  Level of Consciousness Alert  O2 Device Room Air  Assess: MEWS Score  MEWS Temp 0  MEWS Systolic 1  MEWS Pulse 0  MEWS RR 1  MEWS LOC 0  MEWS Score 2  MEWS Score Color Yellow  Assess: if the MEWS score is Yellow or Red  Were vital signs taken at a resting state? Yes  Focused Assessment No change from prior assessment  Does the patient meet 2 or more of the SIRS criteria? No  MEWS guidelines implemented *See Row Information* Yes  Treat  MEWS Interventions Escalated (See documentation below);Other (Comment) (bolus order)  Pain Scale 0-10  Pain Score 0  Take Vital Signs  Increase Vital Sign Frequency  Yellow: Q 2hr X 2 then Q 4hr X 2, if remains yellow, continue Q 4hrs  Escalate  MEWS: Escalate Yellow: discuss with charge nurse/RN and consider discussing with provider and RRT  Notify: Charge Nurse/RN  Name of Charge Nurse/RN Notified Claiborne Billings, RN  Date Charge Nurse/RN Notified 06/26/21  Time Charge Nurse/RN Notified 1845  Notify: Provider  Provider Name/Title Thereasa Solo, MD  Date Provider Notified 06/26/21  Time Provider Notified 1845  Notification Type Page  Notification Reason Other (Comment) (Yellow mews)  Provider response See new orders  Date of Provider Response 06/26/21  Time of Provider Response 1850  Document  Patient Outcome Stabilized after interventions  Progress note created (see row info) Yes  Assess: SIRS CRITERIA  SIRS Temperature  0  SIRS Pulse 0  SIRS Respirations  0  SIRS WBC 0  SIRS Score Sum  0  Nurse noted evening vitals cause a yellow mews. Nurse went in to reassess pt. Vitals and the BP was even lower. Pt. Did feel light head once she set on the edge of the bed. MD notified. New orders places.

## 2021-06-26 NOTE — Consult Note (Signed)
[place] for warfarin  Indication: atrial fibrillation  Allergies  Allergen Reactions   Advair Diskus [Fluticasone-Salmeterol] Other (See Comments)    Joint pain    Alendronate     Muscle pain   Singulair [Montelukast] Other (See Comments)    "muscle pain"   Sulfa Antibiotics Hives   Venlafaxine Other (See Comments)    "altered mental status/tremors"    Patient Measurements: Height: 5\' 4"  (162.6 cm) Weight: 113.3 kg (249 lb 12.8 oz) IBW/kg (Calculated) : 54.7  Vital Signs: Temp: 97.8 F (36.6 C) (10/08 0531) Temp Source: Oral (10/08 0531) BP: 113/90 (10/08 0531) Pulse Rate: 87 (10/08 0531)  Labs: Recent Labs    06/24/21 0532 06/25/21 0636 06/26/21 0433  HGB  --   --  13.1  HCT  --   --  39.6  PLT  --   --  218  LABPROT 27.0* 31.6* 33.6*  INR 2.5* 3.1* 3.3*  CREATININE  --  0.74  --      Estimated Creatinine Clearance: 78.4 mL/min (by C-G formula based on SCr of 0.74 mg/dL).   Medical History: Past Medical History:  Diagnosis Date   A-fib ([place])    Anemia    B12 deficiency    Breast cancer ([place]) 03/31/2013   Right - chemo- mastectomy   Breast cancer ([place]) 1991   Rt.- radiation   Complication of anesthesia 01/2009   Recent years with general anesthesia had itching following surgery   DDD (degenerative disc disease), cervical    Depression    Diverticulitis    Dyspnea    Edema of both legs    GERD (gastroesophageal reflux disease)    Gout    H/O mastectomy 2014   per patient   H/O: cesarean section 1987   per patient report   H/O: hysterectomy 1996   per patient   Hydradenitis    Hypertension    Hypothyroidism    Neuropathy    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 female with hx of A. fib, breast cancer, CVA, polyneuropathy, lymphocytic leukemia, presents with altered mental status and found to be COVID positive. Pharmacy has been consulted for warfarin dosing for A. Fib with Lovenox bridge for subtherapeutic INR on admission.  Baseline labs: INR 1.4; H/H and plts WNL; D-dimer 1.02  DDIs:  Warfarin + levothyroxine >> may increase INR Warfarin + meloxicam >> may increase risk of bleeding.  Date INR Dose 9/30 -- 7 mg  10/1 1.4 10 mg 10/2 1.4 7.5 mg 10/3 1.7 7.5 mg 10/4 1.7 10 mg 10/5 2  7  mg 10/6 2.5  7 mg  10/7 3.1  HOLD 10/8 3.3  HOLD  Goal of Therapy:  INR 2-3 Monitor platelets by anticoagulation protocol: Yes   Plan:  INR supratherapeutic, trended up slightly. Will hold warfarin for another day, then can likely continue home dose if trending down/stable. Daily INR ordered. CBC at least every 3 days.   Tawnya Crook, PharmD, BCPS Clinical Pharmacist 06/26/2021 8:33 AM

## 2021-06-27 DIAGNOSIS — I480 Paroxysmal atrial fibrillation: Secondary | ICD-10-CM | POA: Diagnosis not present

## 2021-06-27 DIAGNOSIS — U071 COVID-19: Secondary | ICD-10-CM | POA: Diagnosis not present

## 2021-06-27 DIAGNOSIS — E872 Acidosis, unspecified: Secondary | ICD-10-CM | POA: Diagnosis not present

## 2021-06-27 LAB — BASIC METABOLIC PANEL
Anion gap: 4 — ABNORMAL LOW (ref 5–15)
BUN: 16 mg/dL (ref 8–23)
CO2: 30 mmol/L (ref 22–32)
Calcium: 8.3 mg/dL — ABNORMAL LOW (ref 8.9–10.3)
Chloride: 106 mmol/L (ref 98–111)
Creatinine, Ser: 0.65 mg/dL (ref 0.44–1.00)
GFR, Estimated: >60 mL/min (ref >60)
Glucose, Bld: 84 mg/dL (ref 70–99)
Potassium: 4.3 mmol/L (ref 3.5–5.1)
Sodium: 140 mmol/L (ref 135–145)

## 2021-06-27 LAB — PROTIME-INR
INR: 3 — ABNORMAL HIGH (ref 0.8–1.2)
Prothrombin Time: 30.8 seconds — ABNORMAL HIGH (ref 11.4–15.2)

## 2021-06-27 MED ORDER — WARFARIN SODIUM 5 MG PO TABS
5.0000 mg | ORAL_TABLET | Freq: Once | ORAL | Status: AC
  Administered 2021-06-27: 5 mg via ORAL
  Filled 2021-06-27: qty 1

## 2021-06-27 NOTE — Consult Note (Signed)
[place] for warfarin  Indication: atrial fibrillation  Allergies  Allergen Reactions   Advair Diskus [Fluticasone-Salmeterol] Other (See Comments)    Joint pain    Alendronate     Muscle pain   Singulair [Montelukast] Other (See Comments)    "muscle pain"   Sulfa Antibiotics Hives   Venlafaxine Other (See Comments)    "altered mental status/tremors"    Patient Measurements: Height: 5\' 4"  (162.6 cm) Weight: 113.3 kg (249 lb 12.8 oz) IBW/kg (Calculated) : 54.7  Vital Signs: Temp: 97.3 F (36.3 C) (10/09 0500) Temp Source: Oral (10/09 0500) BP: 108/66 (10/09 0500) Pulse Rate: 74 (10/09 0500)  Labs: Recent Labs    06/25/21 0636 06/26/21 0433 06/27/21 0502  HGB  --  13.1  --   HCT  --  39.6  --   PLT  --  218  --   LABPROT 31.6* 33.6* 30.8*  INR 3.1* 3.3* 3.0*  CREATININE 0.74  --  0.65     Estimated Creatinine Clearance: 78.4 mL/min (by C-G formula based on SCr of 0.65 mg/dL).   Medical History: Past Medical History:  Diagnosis Date   A-fib ([place])    Anemia    B12 deficiency    Breast cancer ([place]) 03/31/2013   Right - chemo- mastectomy   Breast cancer ([place]) 1991   Rt.- radiation   Complication of anesthesia 01/2009   Recent years with general anesthesia had itching following surgery   DDD (degenerative disc disease), cervical    Depression    Diverticulitis    Dyspnea    Edema of both legs    GERD (gastroesophageal reflux disease)    Gout    H/O mastectomy 2014   per patient   H/O: cesarean section 1987   per patient report   H/O: hysterectomy 1996   per patient   Hydradenitis    Hypertension    Hypothyroidism    Neuropathy    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 female with hx of A. fib, breast cancer, CVA, polyneuropathy, lymphocytic leukemia, presents with altered mental status and found to be COVID positive. Pharmacy has been consulted for warfarin dosing for A. Fib with Lovenox bridge for subtherapeutic INR on admission.  Baseline labs: INR 1.4; H/H and plts WNL; D-dimer 1.02  DDIs:  Warfarin + levothyroxine >> may increase INR Warfarin + meloxicam >> may increase risk of bleeding.  Date INR Dose 9/30 -- 7 mg  10/1 1.4 10 mg 10/2 1.4 7.5 mg 10/3 1.7 7.5 mg 10/4 1.7 10 mg 10/5 2  7  mg 10/6 2.5  7 mg  10/7 3.1  HOLD 10/8 3.3  HOLD 10/9 3.0  Goal of Therapy:  INR 2-3 Monitor platelets by anticoagulation protocol: Yes   Plan:  INR therapeutic, has trended down appropriately. Does not appear to have any signs/symptoms of bleeding. Will give warfarin 5 mg (reduced from home dose). Daily INR ordered. CBC at least every 3 days.   Tawnya Crook, PharmD, BCPS Clinical Pharmacist 06/27/2021 9:00 AM

## 2021-06-27 NOTE — Progress Notes (Signed)
Suzanne Dixon  IOX:[id number] DOB: 07-31-1951 DOA: 06/19/2021 PCP: Adin Hector, MD    Brief Narrative:  70yo with a hx of A. fib, breast cancer, CVA, polyneuropathy, and lymphocytic leukemia, who presented with altered mental status. She reports she had COVID 2 months prior. She endorses body aches and chills.  Denies any other symptoms.  She is unsure where she is. COVID test returned positive.  UA showed extremely concentrated urine and elevated pH.  Blood cultures were obtained.  CT chest abdomen and pelvis was obtained which showed no PE and no acute findings in the abdomen.  CT head without contrast was also unremarkable.  She was given a 1 L normal saline bolus, started on broad-spectrum antibiotics, and admitted for further management.  Consultants:  None  Code Status: FULL CODE  Date of Positive COVID Test:  06/19/2021 (last day of isolation 06/29/21)  COVID-19 specific Treatment: Remdesivir 10/1 > 10/5  Antimicrobials:  Cefepime 10/1 Flagyl 10/1 Vancomycin 10/1 Keflex 10/4 >  DVT prophylaxis: Warfarin  Subjective: Had an episode of mild hypotension yesterday evening which responded well to simple volume.  Afebrile.  Vital signs now stable.  Saturation 100% on room air.  No new complaints today.  Assessment & Plan:  Headache / dental pain No further headache and dental pain reported as mild/improving at this time  Acute metabolic encephalopathy - acute delirium Felt to be due to dehydration related to COVID -resolved with fluid resuscitation  COVID infection Was dosed with Remdesivir earlier in hospital stay - last day on inpatient isolation is 06/29/21 (could be off isolation after 06/24/21 if outpatient)  Lactic acidosis - transient hypotension  Due to simple severe dehydration/volume depletion - resolved with IV fluid resuscitation - stopped lasix and BB for now   Elevated troponin Mild - no chest pain - no further evaluation indicated    Hypomagnesemia Due to poor oral intake -supplemented  Citrobacter UTI Has been dosed with antibiotics -presently asymptomatic  Gout Continue allopurinol  Parkinson's disease Continue Sinemet  Chronic neuropathy Continue Cymbalta and gabapentin  Chronic bilateral lower extremity edema Resume Lasix as appropriate based upon volume status and symptoms  Hypothyroidism Continue usual Synthroid  Atrial fibrillation Continue beta-blocker and warfarin  GERD   Family Communication: No family present at time of exam Status is: Inpatient  Remains inpatient appropriate because:Unsafe d/c plan  Dispo: The patient is from: Home              Anticipated d/c is to: SNF              Patient currently is medically stable to d/c.   Difficult to place patient No    Objective: Blood pressure 134/85, pulse 74, temperature 97.9 F (36.6 C), temperature source Oral, resp. rate 15, height 5\' 4"  (1.626 m), weight 113.3 kg, SpO2 100 %.  Intake/Output Summary (Last 24 hours) at 06/27/2021 1027 Last data filed at 06/27/2021 0614 Gross per 24 hour  Intake 860 ml  Output 400 ml  Net 460 ml    Filed Weights   06/19/21 1258  Weight: 113.3 kg    Examination: General: No acute respiratory distress Lungs: Clear to auscultation bilaterally  Cardiovascular: RRR without murmur  Abdomen: NT/ND, soft, BS+ Extremities: No signif edema BLE  CBC: Recent Labs  Lab 06/21/21 0437 06/22/21 0429 06/23/21 0454 06/26/21 0433  WBC 6.1 5.8 5.1 5.8  NEUTROABS 2.1 1.9 1.7  --   HGB 12.5 12.9 13.1 13.1  HCT 36.9  37.1 39.1 39.6  MCV 92.0 93.5 91.8 93.6  PLT 259 225 248 710    Basic Metabolic Panel: Recent Labs  Lab 06/21/21 0437 06/22/21 0429 06/23/21 0454 06/25/21 0636 06/27/21 0502  NA 138 138  --  138 140  K 3.6 3.7 3.9 4.5 4.3  CL 105 106  --  101 106  CO2 28 27  --  30 30  GLUCOSE 88 87  --  93 84  BUN 13 15  --  16 16  CREATININE 0.65 0.69  --  0.74 0.65  CALCIUM 8.2* 8.0*   --  8.6* 8.3*  MG 1.8 1.6* 1.8  --   --   PHOS 3.7 2.8 3.1  --   --     GFR: Estimated Creatinine Clearance: 78.4 mL/min (by C-G formula based on SCr of 0.65 mg/dL).  Liver Function Tests: Recent Labs  Lab 06/21/21 0437 06/22/21 0429  AST 25 39  ALT <5 <5  ALKPHOS 55 62  BILITOT 0.6 0.5  PROT 5.8* 5.7*  ALBUMIN 2.8* 2.8*      Coagulation Profile: Recent Labs  Lab 06/23/21 0454 06/24/21 0532 06/25/21 0636 06/26/21 0433 06/27/21 0502  INR 2.0* 2.5* 3.1* 3.3* 3.0*      HbA1C: Hgb A1c MFr Bld  Date/Time Value Ref Range Status  02/02/2021 05:55 AM 5.7 (H) 4.8 - 5.6 % Final    Comment:    (NOTE) Pre diabetes:          5.7%-6.4%  Diabetes:              >6.4%  Glycemic control for   <7.0% adults with diabetes     Recent Results (from the past 240 hour(s))  Blood culture (single)     Status: Abnormal   Collection Time: 06/19/21 11:45 AM   Specimen: BLOOD  Result Value Ref Range Status   Specimen Description   Final    BLOOD BLOOD LEFT HAND Performed at [facility], [address], [place], [place] [address]    Special Requests   Final    BOTTLES DRAWN AEROBIC AND ANAEROBIC Blood Culture adequate volume Performed at [facility], [address] [place], [place] [address]    Culture  Setup Time   Final    GRAM POSITIVE RODS IN BOTH AEROBIC AND ANAEROBIC BOTTLES CRITICAL RESULT CALLED TO, READ BACK BY AND VERIFIED WITH: MORGAN HICKS AT 1927 06/20/21.PMF    Culture (A)  Final    DIPHTHEROIDS(CORYNEBACTERIUM SPECIES) Standardized susceptibility testing for this organism is not available. Performed at [facility], [place] [address], [place], [place] [address]    Report Status 06/23/2021 FINAL  Final  Resp Panel by RT-PCR (Flu A&B, Covid) Nasopharyngeal Swab     Status: Abnormal   Collection Time: 06/19/21 12:00 PM   Specimen: Nasopharyngeal Swab; Nasopharyngeal(NP) swabs in vial transport medium  Result Value Ref Range Status    SARS Coronavirus 2 by RT PCR POSITIVE (A) NEGATIVE Final    Comment: RESULT CALLED TO, READ BACK BY AND VERIFIED WITH: C/RONNIE PFLUGER 06/19/21 1344 AMK (NOTE) SARS-CoV-2 target nucleic acids are DETECTED.  The SARS-CoV-2 RNA is generally detectable in upper respiratory specimens during the acute phase of infection. Positive results are indicative of the presence of the identified virus, but do not rule out bacterial infection or co-infection with other pathogens not detected by the test. Clinical correlation with patient history and other diagnostic information is necessary to determine patient infection status. The expected  result is Negative.  Fact Sheet for Patients: EntrepreneurPulse.com.au  Fact Sheet for Healthcare Providers: IncredibleEmployment.be  This test is not yet approved or cleared by the [place] FDA and  has been authorized for detection and/or diagnosis of SARS-CoV-2 by FDA under an Emergency Use Authorization (EUA).  This EUA will remain in effect (meaning this test can be  used) for the duration of  the COVID-19 declaration under Section 564(b)(1) of the Act, 21 U.S.C. section 360bbb-3(b)(1), unless the authorization is terminated or revoked sooner.     Influenza A by PCR NEGATIVE NEGATIVE Final   Influenza B by PCR NEGATIVE NEGATIVE Final    Comment: (NOTE) The Xpert Xpress SARS-CoV-2/FLU/RSV plus assay is intended as an aid in the diagnosis of influenza from Nasopharyngeal swab specimens and should not be used as a sole basis for treatment. Nasal washings and aspirates are unacceptable for Xpert Xpress SARS-CoV-2/FLU/RSV testing.  Fact Sheet for Patients: EntrepreneurPulse.com.au  Fact Sheet for Healthcare Providers: IncredibleEmployment.be  This test is not yet approved or cleared by the [place] FDA and has been authorized for detection and/or diagnosis of SARS-CoV-2  by FDA under an Emergency Use Authorization (EUA). This EUA will remain in effect (meaning this test can be used) for the duration of the COVID-19 declaration under Section 564(b)(1) of the Act, 21 U.S.C. section 360bbb-3(b)(1), unless the authorization is terminated or revoked.  Performed at [facility], [address] [place], [place] [address]   Blood culture (single)     Status: None   Collection Time: 06/19/21  1:18 PM   Specimen: BLOOD  Result Value Ref Range Status   Specimen Description BLOOD LEFT ANTECUBITAL  Final   Special Requests   Final    BOTTLES DRAWN AEROBIC AND ANAEROBIC Blood Culture adequate volume   Culture   Final    NO GROWTH 5 DAYS Performed at [facility], [address] [place], [place] [address]    Report Status 06/24/2021 FINAL  Final  Urine Culture     Status: Abnormal   Collection Time: 06/19/21  2:30 PM   Specimen: Urine, Clean Catch  Result Value Ref Range Status   Specimen Description   Final    URINE, CLEAN CATCH Performed at [facility], [address], [place], [place] [address]    Special Requests   Final    NONE Performed at [facility], [place],  [address]    Culture >=100,000 COLONIES/mL CITROBACTER KOSERI (A)  Final   Report Status 06/22/2021 FINAL  Final   Organism ID, Bacteria CITROBACTER KOSERI (A)  Final      Susceptibility   Citrobacter koseri - MIC*    CEFAZOLIN <=4 SENSITIVE Sensitive     CEFEPIME <=0.12 SENSITIVE Sensitive     CEFTRIAXONE <=0.25 SENSITIVE Sensitive     CIPROFLOXACIN <=0.25 SENSITIVE Sensitive     GENTAMICIN <=1 SENSITIVE Sensitive     IMIPENEM <=0.25 SENSITIVE Sensitive     NITROFURANTOIN 32 SENSITIVE Sensitive     TRIMETH/SULFA <=20 SENSITIVE Sensitive     PIP/TAZO <=4 SENSITIVE Sensitive     * >=100,000 COLONIES/mL CITROBACTER KOSERI     Scheduled Meds:  acidophilus  2 capsule Oral TID   allopurinol  300 mg Oral Daily    carbidopa-levodopa  1.5 tablet Oral TID   DULoxetine  60 mg Oral BID   gabapentin  600 mg Oral QID   levothyroxine  50 mcg Oral Q0600   meloxicam  15  mg Oral Daily   oxybutynin  5 mg Oral BID   pantoprazole  40 mg Oral Daily   potassium chloride  10 mEq Oral TID   sodium chloride flush  3 mL Intravenous Q12H   vitamin B-12  1,000 mcg Oral Daily   warfarin  5 mg Oral ONCE-1600   Warfarin - Pharmacist Dosing Inpatient   Does not apply q1600     LOS: 8 days   Cherene Altes, MD [facility] Office  [phone] Pager - Text Page per Shea Evans  If 7PM-7AM, please contact night-coverage per Amion 06/27/2021, 10:27 AM

## 2021-06-27 NOTE — Progress Notes (Signed)
Physical Therapy Treatment Patient Details Name: Suzanne Dixon MRN: [id number] DOB: May 07, 1951 Today's Date: 06/27/2021   History of Present Illness Pt is a 70 y.o. female with hx of A. fib, breast cancer, CVA, polyneuropathy, Parkinson's Dz, and lymphocytic leukemia who presented with altered mental status. MD assessment includes: AMS, delirium, acute metabolic encephalopathy, [id number] infection, lactic acidosis, elevated troponin, and LE edema.    PT Comments    Pt pleasant and motivated to work with PT but ultimately remains functionally limited due to fatigue, dizziness, weakness (most noted in L knee) and tolerated only limited standing/walking tasks (2 bouts 20-25 ft each with heavy walker use).  She showed good effort with supine exercises as well, but even with modest reps she reports "This is going to get my legs sore tonight."     Recommendations for follow up therapy are one component of a multi-disciplinary discharge planning process, led by the attending physician.  Recommendations may be updated based on patient status, additional functional criteria and insurance authorization.  Follow Up Recommendations  SNF;Supervision for mobility/OOB     Equipment Recommendations  None recommended by PT    Recommendations for Other Services       Precautions / Restrictions Precautions Precautions: Fall Restrictions Weight Bearing Restrictions: No     Mobility  Bed Mobility Overal bed mobility: Modified Independent Bed Mobility: Supine to Sit;Sit to Supine     Supine to sit: Min guard Sit to supine: Min guard   General bed mobility comments: Pt needed light UE use to get LEs in/out of bed but did not need any direct assist    Transfers Overall transfer level: Needs assistance Equipment used: Rolling walker (2 wheeled) Transfers: Sit to/from Stand Sit to Stand: Min assist         General transfer comment: Pt unable to attain standing on first attempt with all  standing efforts.  With cuing and set up for appropriate UE use and sequencing she did manage to get hips forward and up, but w/o confidence and extra effort  Ambulation/Gait Ambulation/Gait assistance: Min guard Gait Distance (Feet): 25 Feet Assistive device: Rolling walker (2 wheeled)       General Gait Details: Pt was able to ambulate 20-25 ft on 2 seperate attempts but could not tolerate any further despite encouragement (and seemingly genuine desire to see if she could).  Pt reports some dizziness with the effort, BP was normal before and after ambulation.   Stairs             Wheelchair Mobility    Modified Rankin (Stroke Patients Only)       Balance Overall balance assessment: Needs assistance   Sitting balance-Leahy Scale: Good     Standing balance support: Bilateral upper extremity supported;During functional activity Standing balance-Leahy Scale: Fair Standing balance comment: no buckling but hesitancy with L knee WBing, reliant on walker                            Cognition Arousal/Alertness: Awake/alert Behavior During Therapy: WFL for tasks assessed/performed Overall Cognitive Status: Within Functional Limits for tasks assessed                                        Exercises General Exercises - Lower Extremity Ankle Circles/Pumps: AROM;10 reps Heel Slides: Strengthening;10 reps (resisted leg ext) Hip ABduction/ADduction: Strengthening;10 reps  Straight Leg Raises: AROM;10 reps    General Comments General comments (skin integrity, edema, etc.): BP in sitting pre ambulation 116/83, post ambulation 135/86      Pertinent Vitals/Pain Pain Assessment: 0-10 Pain Score: 3  Pain Location: L knee    Home Living                      Prior Function            PT Goals (current goals can now be found in the care plan section) Progress towards PT goals: Progressing toward goals    Frequency    Min  2X/week      PT Plan Current plan remains appropriate    Co-evaluation              AM-PAC PT "6 Clicks" Mobility   Outcome Measure  Help needed turning from your back to your side while in a flat bed without using bedrails?: A Little Help needed moving from lying on your back to sitting on the side of a flat bed without using bedrails?: A Little Help needed moving to and from a bed to a chair (including a wheelchair)?: A Little Help needed standing up from a chair using your arms (e.g., wheelchair or bedside chair)?: A Little Help needed to walk in hospital room?: A Lot Help needed climbing 3-5 steps with a railing? : Total 6 Click Score: 15    End of Session   Activity Tolerance: Patient limited by fatigue Patient left: with call bell/phone within reach;with bed alarm set Nurse Communication: Mobility status PT Visit Diagnosis: History of falling (Z91.81);Difficulty in walking, not elsewhere classified (R26.2);Muscle weakness (generalized) (M62.81)     Time: 8466-5993 PT Time Calculation (min) (ACUTE ONLY): 26 min  Charges:  $Gait Training: 8-22 mins $Therapeutic Exercise: 8-22 mins                     Kreg Shropshire, DPT 06/27/2021, 6:00 PM

## 2021-06-28 DIAGNOSIS — E872 Acidosis, unspecified: Secondary | ICD-10-CM | POA: Diagnosis not present

## 2021-06-28 DIAGNOSIS — I480 Paroxysmal atrial fibrillation: Secondary | ICD-10-CM | POA: Diagnosis not present

## 2021-06-28 DIAGNOSIS — U071 COVID-19: Secondary | ICD-10-CM | POA: Diagnosis not present

## 2021-06-28 LAB — BASIC METABOLIC PANEL
Anion gap: 6 (ref 5–15)
BUN: 12 mg/dL (ref 8–23)
CO2: 26 mmol/L (ref 22–32)
Calcium: 8.5 mg/dL — ABNORMAL LOW (ref 8.9–10.3)
Chloride: 105 mmol/L (ref 98–111)
Creatinine, Ser: 0.58 mg/dL (ref 0.44–1.00)
GFR, Estimated: >60 mL/min (ref >60)
Glucose, Bld: 83 mg/dL (ref 70–99)
Potassium: 4.3 mmol/L (ref 3.5–5.1)
Sodium: 137 mmol/L (ref 135–145)

## 2021-06-28 LAB — CBC
HCT: 36.2 % (ref 36.0–46.0)
Hemoglobin: 12.1 g/dL (ref 12.0–15.0)
MCH: 31.6 pg (ref 26.0–34.0)
MCHC: 33.4 g/dL (ref 30.0–36.0)
MCV: 94.5 fL (ref 80.0–100.0)
Platelets: 215 10*3/uL (ref 150–400)
RBC: 3.83 MIL/uL — ABNORMAL LOW (ref 3.87–5.11)
RDW: 14.4 % (ref 11.5–15.5)
WBC: 5.6 10*3/uL (ref 4.0–10.5)
nRBC: 0 % (ref 0.0–0.2)

## 2021-06-28 LAB — PROTIME-INR
INR: 2.6 — ABNORMAL HIGH (ref 0.8–1.2)
Prothrombin Time: 27.9 seconds — ABNORMAL HIGH (ref 11.4–15.2)

## 2021-06-28 MED ORDER — WARFARIN SODIUM 5 MG PO TABS
5.0000 mg | ORAL_TABLET | Freq: Once | ORAL | Status: AC
  Administered 2021-06-28: 5 mg via ORAL
  Filled 2021-06-28: qty 1

## 2021-06-28 NOTE — Plan of Care (Signed)
  Problem: Education: Goal: Knowledge of risk factors and measures for prevention of condition will improve Outcome: Progressing   Problem: Coping: Goal: Psychosocial and spiritual needs will be supported Outcome: Progressing   Problem: Respiratory: Goal: Will maintain a patent airway Outcome: Progressing Goal: Complications related to the disease process, condition or treatment will be avoided or minimized Outcome: Progressing   Problem: Education: Goal: Knowledge of General Education information will improve Description: Including pain rating scale, medication(s)/side effects and non-pharmacologic comfort measures Outcome: Progressing   Problem: Health Behavior/Discharge Planning: Goal: Ability to manage health-related needs will improve Outcome: Progressing   Problem: Clinical Measurements: Goal: Ability to maintain clinical measurements within normal limits will improve Outcome: Progressing Goal: Will remain free from infection Outcome: Progressing Goal: Diagnostic test results will improve Outcome: Progressing Goal: Respiratory complications will improve Outcome: Progressing Goal: Cardiovascular complication will be avoided Outcome: Progressing   Problem: Activity: Goal: Risk for activity intolerance will decrease Outcome: Progressing   Problem: Nutrition: Goal: Adequate nutrition will be maintained Outcome: Progressing   Problem: Coping: Goal: Level of anxiety will decrease Outcome: Progressing   Problem: Elimination: Goal: Will not experience complications related to bowel motility Outcome: Not Progressing Goal: Will not experience complications related to urinary retention Outcome: Progressing   Problem: Pain Managment: Goal: General experience of comfort will improve Outcome: Progressing   Problem: Safety: Goal: Ability to remain free from injury will improve Outcome: Progressing   Problem: Skin Integrity: Goal: Risk for impaired skin integrity  will decrease Outcome: Progressing

## 2021-06-28 NOTE — Consult Note (Signed)
[place] for warfarin  Indication: atrial fibrillation  Allergies  Allergen Reactions   Advair Diskus [Fluticasone-Salmeterol] Other (See Comments)    Joint pain    Alendronate     Muscle pain   Singulair [Montelukast] Other (See Comments)    "muscle pain"   Sulfa Antibiotics Hives   Venlafaxine Other (See Comments)    "altered mental status/tremors"    Patient Measurements: Height: 5\' 4"  (162.6 cm) Weight: 113.3 kg (249 lb 12.8 oz) IBW/kg (Calculated) : 54.7  Vital Signs: Temp: 98 F (36.7 C) (10/10 0813) Temp Source: Oral (10/10 0813) BP: 96/62 (10/10 0813) Pulse Rate: 89 (10/10 0813)  Labs: Recent Labs    06/26/21 0433 06/27/21 0502 06/28/21 0425  HGB 13.1  --  12.1  HCT 39.6  --  36.2  PLT 218  --  215  LABPROT 33.6* 30.8* 27.9*  INR 3.3* 3.0* 2.6*  CREATININE  --  0.65 0.58     Estimated Creatinine Clearance: 78.4 mL/min (by C-G formula based on SCr of 0.58 mg/dL).   Medical History: Past Medical History:  Diagnosis Date   A-fib ([place])    Anemia    B12 deficiency    Breast cancer ([place]) 03/31/2013   Right - chemo- mastectomy   Breast cancer ([place]) 1991   Rt.- radiation   Complication of anesthesia 01/2009   Recent years with general anesthesia had itching following surgery   DDD (degenerative disc disease), cervical    Depression    Diverticulitis    Dyspnea    Edema of both legs    GERD (gastroesophageal reflux disease)    Gout    H/O mastectomy 2014   per patient   H/O: cesarean section 1987   per patient report   H/O: hysterectomy 1996   per patient   Hydradenitis    Hypertension    Hypothyroidism    Neuropathy    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 female with hx of A. fib, breast cancer, CVA, polyneuropathy, lymphocytic leukemia, presents with altered mental status and found to be COVID positive. Pharmacy has been consulted for warfarin dosing for A. Fib with Lovenox bridge for subtherapeutic INR on admission.  Baseline labs: INR 1.4; H/H and plts WNL; D-dimer 1.02  DDIs:  Warfarin + levothyroxine >> may increase INR Warfarin + meloxicam >> may increase risk of bleeding. Warfarin + allopurinol >> may increase warfarin effect  Date INR Dose 9/30 -- 7 mg  10/1 1.4 10 mg 10/2 1.4 7.5 mg 10/3 1.7 7.5 mg 10/4 1.7 10 mg 10/5 2  7  mg 10/6 2.5  7 mg  10/7 3.1  HOLD 10/8 3.3  HOLD 10/9 3.0 5 mg 10/10 2.6  Goal of Therapy:  INR 2-3 Monitor platelets by anticoagulation protocol: Yes   Plan:  INR therapeutic Will give warfarin 5 mg x1 again today (reduced from home dose of 7 mg daily). Daily INR ordered. CBC at least every 3 days.   Noralee Space, PharmD Clinical Pharmacist 06/28/2021 10:27 AM

## 2021-06-28 NOTE — Progress Notes (Signed)
Occupational Therapy Treatment Patient Details Name: Suzanne Dixon MRN: [id number] DOB: 1951/01/28 Today's Date: 06/28/2021   History of present illness Pt is a 70 y.o. female with hx of A. fib, breast cancer, CVA, polyneuropathy, Parkinson's Dz, and lymphocytic leukemia who presented with altered mental status. MD assessment includes: AMS, delirium, acute metabolic encephalopathy, NFAOZ-30 infection, lactic acidosis, elevated troponin, and LE edema.   OT comments  Upon entering the room, pt supine in bed with c/o headache pain and RN bringing medications. Pt agreeable to OT from EOB. Focus on B UE strengthening exercises while pt holds onto towel for technique. Pt performing 2 sets of 10 reps of bicep curls, chest presses, and straight arm raises. Pt fatigues with exercises. 2 sets of 10 B LE kicks and high knees from seated position with min cuing for technique. Min A for sit >supine. All needs within reach. Pt continues to benefit from OT intervention.    Recommendations for follow up therapy are one component of a multi-disciplinary discharge planning process, led by the attending physician.  Recommendations may be updated based on patient status, additional functional criteria and insurance authorization.    Follow Up Recommendations  SNF;Supervision - Intermittent    Equipment Recommendations  None recommended by OT       Precautions / Restrictions Precautions Precautions: Fall       Mobility Bed Mobility Overal bed mobility: Modified Independent Bed Mobility: Supine to Sit;Sit to Supine     Supine to sit: Min assist Sit to supine: Min assist   General bed mobility comments: min A for trunk support    Transfers                 General transfer comment: Pt declined secondary to headache    Balance Overall balance assessment: Needs assistance   Sitting balance-Leahy Scale: Good Sitting balance - Comments: sitting EOC for bathing tasks       Standing balance  comment: no buckling but hesitancy with L knee WBing, reliant on walker                           ADL either performed or assessed with clinical judgement     Vision Patient Visual Report: No change from baseline            Cognition Arousal/Alertness: Awake/alert Behavior During Therapy: WFL for tasks assessed/performed Overall Cognitive Status: Within Functional Limits for tasks assessed                                 General Comments: Pt is pleasant and cooperative                   Pertinent Vitals/ Pain       Pain Assessment: Faces Faces Pain Scale: Hurts even more Pain Location: headache Pain Descriptors / Indicators: Aching;Discomfort Pain Intervention(s): Limited activity within patient's tolerance;RN gave pain meds during session;Monitored during session;Repositioned         Frequency  Min 2X/week        Progress Toward Goals  OT Goals(current goals can now be found in the care plan section)  Progress towards OT goals: Progressing toward goals  Acute Rehab OT Goals Patient Stated Goal: To get stronger and go home OT Goal Formulation: With patient/family Time For Goal Achievement: 07/05/21 Potential to Achieve Goals: Good  Plan Discharge plan remains appropriate;Frequency remains appropriate  AM-PAC OT "6 Clicks" Daily Activity     Outcome Measure   Help from another person eating meals?: None Help from another person taking care of personal grooming?: A Little Help from another person toileting, which includes using toliet, bedpan, or urinal?: A Little Help from another person bathing (including washing, rinsing, drying)?: A Little Help from another person to put on and taking off regular upper body clothing?: A Little Help from another person to put on and taking off regular lower body clothing?: A Lot 6 Click Score: 18    End of Session    OT Visit Diagnosis: Unsteadiness on feet (R26.81);Repeated falls  (R29.6);Muscle weakness (generalized) (M62.81)   Activity Tolerance Patient tolerated treatment well   Patient Left with call bell/phone within reach;in chair;with chair alarm set   Nurse Communication Mobility status        Time: 1194-1740 OT Time Calculation (min): 28 min  Charges: OT General Charges $OT Visit: 1 Visit OT Treatments $Therapeutic Exercise: 23-37 mins  Darleen Crocker, MS, OTR/L , CBIS ascom [phone]  06/28/21, 4:32 PM

## 2021-06-28 NOTE — Progress Notes (Signed)
Suzanne Dixon  BTD:[id number] DOB: 10/04/1950 DOA: 06/19/2021 PCP: Adin Hector, MD    Brief Narrative:  71yo with a hx of A. fib, Parkinson's disease, breast cancer, CVA, polyneuropathy, and lymphocytic leukemia, who presented with altered mental status. She reports she had COVID 2 months prior. She endorses body aches and chills.  Denies any other symptoms.  She is unsure where she is. COVID test returned positive.  UA showed extremely concentrated urine and elevated pH.  Blood cultures were obtained.  CT chest abdomen and pelvis was obtained which showed no PE and no acute findings in the abdomen.  CT head without contrast was also unremarkable.  She was given a 1 L normal saline bolus, started on broad-spectrum antibiotics, and admitted for further management.  Consultants:  None  Code Status: FULL CODE  Date of Positive COVID Test:  06/19/2021 (last day of isolation 06/29/21)  COVID-19 specific Treatment: Remdesivir 10/1 > 10/5  Antimicrobials:  Cefepime 10/1 Flagyl 10/1 Vancomycin 10/1 Keflex 10/4 >  DVT prophylaxis: Warfarin  Subjective: Afebrile.  Vital signs stable.  No acute events reported overnight.  No significant derangements on follow-up laboratory data today.  States she is doing well with no new complaints.  Tooth pain is very minimal at present.  No headache.  Assessment & Plan:  Headache / dental pain No further headache and dental pain reported as mild at this time  Acute metabolic encephalopathy - acute delirium Felt to be due to dehydration related to COVID -resolved with fluid resuscitation  COVID infection Was dosed with Remdesivir earlier in hospital stay - last day on inpatient isolation is 06/29/21 (could be off isolation after 06/24/21 if outpatient)  Lactic acidosis - transient hypotension  Due to simple severe dehydration/volume depletion - resolved with IV fluid resuscitation - stopped lasix and BB for now   Elevated troponin Mild - no  chest pain - no further evaluation indicated   Hypomagnesemia Due to poor oral intake -supplemented  Citrobacter UTI Has been dosed with antibiotics -presently asymptomatic  Gout Continue allopurinol  Parkinson's disease Continue Sinemet -well compensated  Chronic neuropathy Continue Cymbalta and gabapentin  Chronic bilateral lower extremity edema Lasix prn as appropriate based upon volume status and symptoms -on hold for now due to poor oral intake  Hypothyroidism Continue usual Synthroid  Atrial fibrillation Continue beta-blocker and warfarin  GERD   Family Communication: No family present at time of exam Status is: Inpatient  Remains inpatient appropriate because:Unsafe d/c plan  Dispo: The patient is from: Home              Anticipated d/c is to: SNF              Patient currently is medically stable to d/c.   Difficult to place patient No    Objective: Blood pressure 96/62, pulse 89, temperature 98 F (36.7 C), temperature source Oral, resp. rate 16, height 5\' 4"  (1.626 m), weight 113.3 kg, SpO2 99 %.  Intake/Output Summary (Last 24 hours) at 06/28/2021 0955 Last data filed at 06/28/2021 0533 Gross per 24 hour  Intake --  Output 1400 ml  Net -1400 ml    Filed Weights   06/19/21 1258  Weight: 113.3 kg    Examination: General: No acute respiratory distress Lungs: Clear to auscultation B Cardiovascular: RRR without murmur  Abdomen: NT/ND, soft, BS+ Extremities: No signif edema B lower extremities  CBC: Recent Labs  Lab 06/22/21 0429 06/23/21 0454 06/26/21 0433 06/28/21 0425  WBC 5.8 5.1 5.8 5.6  NEUTROABS 1.9 1.7  --   --   HGB 12.9 13.1 13.1 12.1  HCT 37.1 39.1 39.6 36.2  MCV 93.5 91.8 93.6 94.5  PLT 225 248 218 937    Basic Metabolic Panel: Recent Labs  Lab 06/22/21 0429 06/23/21 0454 06/25/21 0636 06/27/21 0502 06/28/21 0425  NA 138  --  138 140 137  K 3.7 3.9 4.5 4.3 4.3  CL 106  --  101 106 105  CO2 27  --  30 30 26    GLUCOSE 87  --  93 84 83  BUN 15  --  16 16 12   CREATININE 0.69  --  0.74 0.65 0.58  CALCIUM 8.0*  --  8.6* 8.3* 8.5*  MG 1.6* 1.8  --   --   --   PHOS 2.8 3.1  --   --   --     GFR: Estimated Creatinine Clearance: 78.4 mL/min (by C-G formula based on SCr of 0.58 mg/dL).  Liver Function Tests: Recent Labs  Lab 06/22/21 0429  AST 39  ALT <5  ALKPHOS 62  BILITOT 0.5  PROT 5.7*  ALBUMIN 2.8*      Coagulation Profile: Recent Labs  Lab 06/24/21 0532 06/25/21 0636 06/26/21 0433 06/27/21 0502 06/28/21 0425  INR 2.5* 3.1* 3.3* 3.0* 2.6*      HbA1C: Hgb A1c MFr Bld  Date/Time Value Ref Range Status  02/02/2021 05:55 AM 5.7 (H) 4.8 - 5.6 % Final    Comment:    (NOTE) Pre diabetes:          5.7%-6.4%  Diabetes:              >6.4%  Glycemic control for   <7.0% adults with diabetes     Recent Results (from the past 240 hour(s))  Blood culture (single)     Status: Abnormal   Collection Time: 06/19/21 11:45 AM   Specimen: BLOOD  Result Value Ref Range Status   Specimen Description   Final    BLOOD BLOOD LEFT HAND Performed at [facility], [address] [place], [place] [address]    Special Requests   Final    BOTTLES DRAWN AEROBIC AND ANAEROBIC Blood Culture adequate volume Performed at [facility], [address] [place], [place] [address]    Culture  Setup Time   Final    GRAM POSITIVE RODS IN BOTH AEROBIC AND ANAEROBIC BOTTLES CRITICAL RESULT CALLED TO, READ BACK BY AND VERIFIED WITH: MORGAN HICKS AT 1927 06/20/21.PMF    Culture (A)  Final    DIPHTHEROIDS(CORYNEBACTERIUM SPECIES) Standardized susceptibility testing for this organism is not available. Performed at [facility], [place] [address] [place], [place] [address]    Report Status 06/23/2021 FINAL  Final  Resp Panel by RT-PCR (Flu A&B, Covid) Nasopharyngeal Swab     Status: Abnormal   Collection Time: 06/19/21 12:00 PM   Specimen: Nasopharyngeal Swab;  Nasopharyngeal(NP) swabs in vial transport medium  Result Value Ref Range Status   SARS Coronavirus 2 by RT PCR POSITIVE (A) NEGATIVE Final    Comment: RESULT CALLED TO, READ BACK BY AND VERIFIED WITH: C/RONNIE PFLUGER 06/19/21 1344 AMK (NOTE) SARS-CoV-2 target nucleic acids are DETECTED.  The SARS-CoV-2 RNA is generally detectable in upper respiratory specimens during the acute phase of infection. Positive results are indicative of the presence of the identified virus, but do not rule out bacterial infection or co-infection with other pathogens not detected by  the test. Clinical correlation with patient history and other diagnostic information is necessary to determine patient infection status. The expected result is Negative.  Fact Sheet for Patients: EntrepreneurPulse.com.au  Fact Sheet for Healthcare Providers: IncredibleEmployment.be  This test is not yet approved or cleared by the [place] FDA and  has been authorized for detection and/or diagnosis of SARS-CoV-2 by FDA under an Emergency Use Authorization (EUA).  This EUA will remain in effect (meaning this test can be  used) for the duration of  the COVID-19 declaration under Section 564(b)(1) of the Act, 21 U.S.C. section 360bbb-3(b)(1), unless the authorization is terminated or revoked sooner.     Influenza A by PCR NEGATIVE NEGATIVE Final   Influenza B by PCR NEGATIVE NEGATIVE Final    Comment: (NOTE) The Xpert Xpress SARS-CoV-2/FLU/RSV plus assay is intended as an aid in the diagnosis of influenza from Nasopharyngeal swab specimens and should not be used as a sole basis for treatment. Nasal washings and aspirates are unacceptable for Xpert Xpress SARS-CoV-2/FLU/RSV testing.  Fact Sheet for Patients: EntrepreneurPulse.com.au  Fact Sheet for Healthcare Providers: IncredibleEmployment.be  This test is not yet approved or cleared by the  [place] FDA and has been authorized for detection and/or diagnosis of SARS-CoV-2 by FDA under an Emergency Use Authorization (EUA). This EUA will remain in effect (meaning this test can be used) for the duration of the COVID-19 declaration under Section 564(b)(1) of the Act, 21 U.S.C. section 360bbb-3(b)(1), unless the authorization is terminated or revoked.  Performed at [facility], [address] [place], [place] [address]   Blood culture (single)     Status: None   Collection Time: 06/19/21  1:18 PM   Specimen: BLOOD  Result Value Ref Range Status   Specimen Description BLOOD LEFT ANTECUBITAL  Final   Special Requests   Final    BOTTLES DRAWN AEROBIC AND ANAEROBIC Blood Culture adequate volume   Culture   Final    NO GROWTH 5 DAYS Performed at [facility], [address] [place], [place] [address]    Report Status 06/24/2021 FINAL  Final  Urine Culture     Status: Abnormal   Collection Time: 06/19/21  2:30 PM   Specimen: Urine, Clean Catch  Result Value Ref Range Status   Specimen Description   Final    URINE, CLEAN CATCH Performed at [facility] [address], [place], [place] [address]    Special Requests   Final    NONE Performed at [facility], [place], [place] [address]    Culture >=100,000 COLONIES/mL CITROBACTER KOSERI (A)  Final   Report Status 06/22/2021 FINAL  Final   Organism ID, Bacteria CITROBACTER KOSERI (A)  Final      Susceptibility   Citrobacter koseri - MIC*    CEFAZOLIN <=4 SENSITIVE Sensitive     CEFEPIME <=0.12 SENSITIVE Sensitive     CEFTRIAXONE <=0.25 SENSITIVE Sensitive     CIPROFLOXACIN <=0.25 SENSITIVE Sensitive     GENTAMICIN <=1 SENSITIVE Sensitive     IMIPENEM <=0.25 SENSITIVE Sensitive     NITROFURANTOIN 32 SENSITIVE Sensitive     TRIMETH/SULFA <=20 SENSITIVE Sensitive     PIP/TAZO <=4 SENSITIVE Sensitive     * >=100,000 COLONIES/mL CITROBACTER KOSERI      Scheduled Meds:  acidophilus  2 capsule Oral TID   allopurinol  300 mg Oral Daily   carbidopa-levodopa  1.5 tablet Oral TID   DULoxetine  60 mg Oral BID  gabapentin  600 mg Oral QID   levothyroxine  50 mcg Oral Q0600   meloxicam  15 mg Oral Daily   oxybutynin  5 mg Oral BID   pantoprazole  40 mg Oral Daily   potassium chloride  10 mEq Oral TID   sodium chloride flush  3 mL Intravenous Q12H   vitamin B-12  1,000 mcg Oral Daily   Warfarin - Pharmacist Dosing Inpatient   Does not apply q1600     LOS: 9 days   Cherene Altes, MD [facility] Office  [phone] Pager - Text Page per Shea Evans  If 7PM-7AM, please contact night-coverage per Amion 06/28/2021, 9:55 AM

## 2021-06-28 NOTE — TOC Progression Note (Signed)
Transition of Care [facility]) - Progression Note    Patient Details  Name: Suzanne Dixon MRN: [id number] Date of Birth: 07-Sep-1951  Transition of Care [facility]) CM/SW Contact  Beverly Sessions, RN Phone Number: 06/28/2021, 2:51 PM  Clinical Narrative:    Level 2 PASRR still pending Auth pending    Expected Discharge Plan: [place] Barriers to Discharge: Continued Medical Work up  Expected Discharge Plan and Services Expected Discharge Plan: [place] Choice: Resumption of Svcs/PTA Provider Living arrangements for the past 2 months: Single Family Home                           HH Arranged: RN, PT, OT, Nurse's Aide, Social Work [facility] Agency: [place] Date [facility] Agency Contacted: 06/21/21   Representative spoke with at [place]: [place] [employer]   Social Determinants of Health ([place]) Interventions    Readmission Risk Interventions Readmission Risk Prevention Plan 06/21/2021  Transportation Screening Complete  PCP or Specialist Appt within 3-5 Days Complete  HRI or [place] Complete  Social Work Consult for [place] Planning/Counseling Complete  Palliative Care Screening Not Applicable  Medication Review [employer]) Complete  Some recent data might be hidden

## 2021-06-29 DIAGNOSIS — E538 Deficiency of other specified B group vitamins: Secondary | ICD-10-CM | POA: Diagnosis not present

## 2021-06-29 DIAGNOSIS — U071 COVID-19: Secondary | ICD-10-CM | POA: Diagnosis not present

## 2021-06-29 DIAGNOSIS — I480 Paroxysmal atrial fibrillation: Secondary | ICD-10-CM | POA: Diagnosis not present

## 2021-06-29 LAB — PROTIME-INR
INR: 2.9 — ABNORMAL HIGH (ref 0.8–1.2)
Prothrombin Time: 30.1 seconds — ABNORMAL HIGH (ref 11.4–15.2)

## 2021-06-29 MED ORDER — WARFARIN SODIUM 2.5 MG PO TABS
2.5000 mg | ORAL_TABLET | Freq: Once | ORAL | Status: AC
  Administered 2021-06-29: 2.5 mg via ORAL
  Filled 2021-06-29: qty 1

## 2021-06-29 NOTE — Plan of Care (Signed)

## 2021-06-29 NOTE — Progress Notes (Signed)
Physical Therapy Treatment Patient Details Name: Suzanne Dixon MRN: [id number] DOB: [birth year] Today's Date: 06/29/2021   History of Present Illness Pt is a 70 y.o. female with hx of A. fib, breast cancer, CVA, polyneuropathy, Parkinson's Dz, and lymphocytic leukemia who presented with altered mental status. MD assessment includes: AMS, delirium, acute metabolic encephalopathy, AOZHY-86 infection, lactic acidosis, elevated troponin, and LE edema.    PT Comments    Pt alert in bed, agreeable and pleasant throughout therapy session. Slight dizziness following upright sitting, although pt notes much less so than previous days. Elevated HR noted with rest and mobility (see vital assessment below), but orthostasis absent w/ evaluation. Dizziness dissipated with upright mobility. Progressed to less assistance for STS x 3 task requiring MIN-G for cues. Ambulated slowly in room w/ seated rest break due to elevated HR with standing. LOB x 1 in standing with knee buckle due to pain, no physical assist provided for recovery. RN notified of elevated HR. SNF remains primary recommendation due to LOB, decreased endurance, strength, and decreased overall functional mobility. Skilled PT intervention is indicated to address deficits in function, mobility, and to return to PLOF as able.    Vitals pre-activity, seated: BP 124/80 HR: 110; pre-activity standing: 128/97, 120; post-activity, seated HR: 120(RN notified of elevated HR at rest pre & post session)   Recommendations for follow up therapy are one component of a multi-disciplinary discharge planning process, led by the attending physician.  Recommendations may be updated based on patient status, additional functional criteria and insurance authorization.  Follow Up Recommendations  SNF;Supervision for mobility/OOB     Equipment Recommendations  None recommended by PT    Recommendations for Other Services       Precautions / Restrictions  Precautions Precautions: Fall Restrictions Weight Bearing Restrictions: No     Mobility  Bed Mobility Overal bed mobility: Needs Assistance Bed Mobility: Supine to Sit     Supine to sit: HOB elevated;Modified independent (Device/Increase time)     General bed mobility comments: no physical assistance provided, HOB elevated, would likely need assistance from lowered surface    Transfers Overall transfer level: Needs assistance Equipment used: Rolling walker (2 wheeled) Transfers: Sit to/from Stand Sit to Stand: Min guard         General transfer comment: Cues for RW hand placement; STS x 3 without physical assist  Ambulation/Gait Ambulation/Gait assistance: Min guard Gait Distance (Feet): 45 Feet Assistive device: Rolling walker (2 wheeled) Gait Pattern/deviations: Step-to pattern;Trunk flexed;Decreased step length - right;Decreased step length - left Gait velocity: decreased   General Gait Details: Pt amb in room x 2 w/ seated rest break in between laps. LOB x 1 recovered by ankle strategy, no PT assist necessary due to knee buckling from pain   Stairs             Wheelchair Mobility    Modified Rankin (Stroke Patients Only)       Balance Overall balance assessment: Needs assistance   Sitting balance-Leahy Scale: Good Sitting balance - Comments: Able to sit without leaning or LOB   Standing balance support: Bilateral upper extremity supported;During functional activity Standing balance-Leahy Scale: Fair Standing balance comment: requires BUE support for dyanmic balance, able to stand statically without UE support for short bouts                            Cognition Arousal/Alertness: Awake/alert Behavior During Therapy: WFL for tasks assessed/performed Overall Cognitive  Status: Within Functional Limits for tasks assessed                                 General Comments: pleasant and cooperative      Exercises General  Exercises - Lower Extremity Ankle Circles/Pumps: AROM;10 reps Short Arc Quad: AROM;Strengthening;Both;10 reps Hip ABduction/ADduction: Strengthening;10 reps;Supine Straight Leg Raises: AROM;Both;10 reps;Supine    General Comments General comments (skin integrity, edema, etc.): pre-activity, seated: BP  124/80 HR: 110; pre-activity standing: 128/97, 120; post-activity HR: 120-125 (RN notified of elevated HR at rest pre & post session)      Pertinent Vitals/Pain Pain Assessment: 0-10 Pain Score: 7  Pain Location: R knee during amb Pain Descriptors / Indicators: Aching;Discomfort Pain Intervention(s): Limited activity within patient's tolerance;Monitored during session;Repositioned    Home Living                      Prior Function            PT Goals (current goals can now be found in the care plan section) Progress towards PT goals: Progressing toward goals    Frequency    Min 2X/week      PT Plan Current plan remains appropriate    Co-evaluation              AM-PAC PT "6 Clicks" Mobility   Outcome Measure  Help needed turning from your back to your side while in a flat bed without using bedrails?: A Little Help needed moving from lying on your back to sitting on the side of a flat bed without using bedrails?: A Little Help needed moving to and from a bed to a chair (including a wheelchair)?: A Little Help needed standing up from a chair using your arms (e.g., wheelchair or bedside chair)?: A Little Help needed to walk in hospital room?: A Lot Help needed climbing 3-5 steps with a railing? : Total 6 Click Score: 15    End of Session Equipment Utilized During Treatment: Gait belt Activity Tolerance: Patient tolerated treatment well Patient left: in chair;with call bell/phone within reach;with chair alarm set Nurse Communication: Mobility status PT Visit Diagnosis: History of falling (Z91.81);Difficulty in walking, not elsewhere classified  (R26.2);Muscle weakness (generalized) (M62.81)     Time: 9509-3267 PT Time Calculation (min) (ACUTE ONLY): 50 min  Charges:  $Therapeutic Exercise: 23-37 mins $Therapeutic Activity: 8-22 mins          [employer], SPT

## 2021-06-29 NOTE — Progress Notes (Signed)
Suzanne Dixon  DQQ:[id number] DOB: 19-Oct-1950 DOA: 06/19/2021 PCP: Adin Hector, MD    Brief Narrative:  70yo with a hx of A. fib, Parkinson's disease, breast cancer, CVA, polyneuropathy, and lymphocytic leukemia, who presented with altered mental status. She reports she had COVID 2 months prior. She endorses body aches and chills.  Denies any other symptoms.  She is unsure where she is. COVID test returned positive.  UA showed extremely concentrated urine and elevated pH.  Blood cultures were obtained.  CT chest abdomen and pelvis was obtained which showed no PE and no acute findings in the abdomen.  CT head without contrast was also unremarkable.  She was given a 1 L normal saline bolus, started on broad-spectrum antibiotics, and admitted for further management.  Consultants:  None  Code Status: FULL CODE  Date of Positive COVID Test:  06/19/2021 (last day of isolation 06/29/21)  COVID-19 specific Treatment: Remdesivir 10/1 > 10/5  Antimicrobials:  Cefepime 10/1 Flagyl 10/1 Vancomycin 10/1 Keflex 10/4 >  DVT prophylaxis: Warfarin  Subjective: No acute events reported overnight.  Afebrile.  Vital signs stable.  Agrees to rehab SNF placement tomorrow.  Assessment & Plan:  Headache / dental pain No further headache and dental pain reported as mild/nearly resolved  Acute metabolic encephalopathy - acute delirium Felt to be due to dehydration related to COVID -resolved with fluid resuscitation -mental status stable at discharge  COVID infection Was dosed with Remdesivir earlier in hospital stay - last day on inpatient isolation is 06/29/21 (could be off isolation after 06/24/21 if outpatient)  Lactic acidosis - transient hypotension  Due to simple severe dehydration/volume depletion - resolved with IV fluid resuscitation - stopped lasix and BB for now   Elevated troponin Mild w/o sharp peaking pattern - no chest pain - no further evaluation indicated    Hypomagnesemia Due to poor oral intake - supplemented  Citrobacter UTI Completed a course of antibiotic tx -presently asymptomatic  Gout Continue allopurinol  Parkinson's disease Continue Sinemet -well compensated  Chronic neuropathy Continue Cymbalta and gabapentin  Chronic bilateral lower extremity edema Lasix prn as appropriate based upon volume status and symptoms -on hold for now due to poor oral intake  Hypothyroidism Continue usual Synthroid  Atrial fibrillation Continue beta-blocker and warfarin  GERD   Family Communication: No family present at time of exam Status is: Inpatient  Remains inpatient appropriate because:Unsafe d/c plan  Dispo: The patient is from: Home              Anticipated d/c is to: SNF              Patient currently is medically stable to d/c.   Difficult to place patient No    Objective: Blood pressure 102/60, pulse 96, temperature 97.7 F (36.5 C), resp. rate 17, height 5\' 4"  (1.626 m), weight 113.3 kg, SpO2 96 %.  Intake/Output Summary (Last 24 hours) at 06/29/2021 1037 Last data filed at 06/29/2021 0841 Gross per 24 hour  Intake 0 ml  Output 1650 ml  Net -1650 ml    Filed Weights   06/19/21 1258  Weight: 113.3 kg    Examination: General: No acute respiratory distress Lungs: Clear to auscultation B w/o wheezing  Cardiovascular: RRR without murmur  Abdomen: NT/ND, soft, BS+ Extremities: No signif edema B LE   CBC: Recent Labs  Lab 06/23/21 0454 06/26/21 0433 06/28/21 0425  WBC 5.1 5.8 5.6  NEUTROABS 1.7  --   --   HGB  13.1 13.1 12.1  HCT 39.1 39.6 36.2  MCV 91.8 93.6 94.5  PLT 248 218 657    Basic Metabolic Panel: Recent Labs  Lab 06/23/21 0454 06/25/21 0636 06/27/21 0502 06/28/21 0425  NA  --  138 140 137  K 3.9 4.5 4.3 4.3  CL  --  101 106 105  CO2  --  30 30 26   GLUCOSE  --  93 84 83  BUN  --  16 16 12   CREATININE  --  0.74 0.65 0.58  CALCIUM  --  8.6* 8.3* 8.5*  MG 1.8  --   --   --    PHOS 3.1  --   --   --     GFR: Estimated Creatinine Clearance: 78.4 mL/min (by C-G formula based on SCr of 0.58 mg/dL).   Coagulation Profile: Recent Labs  Lab 06/25/21 0636 06/26/21 0433 06/27/21 0502 06/28/21 0425 06/29/21 0535  INR 3.1* 3.3* 3.0* 2.6* 2.9*     HbA1C: Hgb A1c MFr Bld  Date/Time Value Ref Range Status  02/02/2021 05:55 AM 5.7 (H) 4.8 - 5.6 % Final    Comment:    (NOTE) Pre diabetes:          5.7%-6.4%  Diabetes:              >6.4%  Glycemic control for   <7.0% adults with diabetes     Recent Results (from the past 240 hour(s))  Blood culture (single)     Status: Abnormal   Collection Time: 06/19/21 11:45 AM   Specimen: BLOOD  Result Value Ref Range Status   Specimen Description   Final    BLOOD BLOOD LEFT HAND Performed at [facility], [address], [place], [place] [address]    Special Requests   Final    BOTTLES DRAWN AEROBIC AND ANAEROBIC Blood Culture adequate volume Performed at [facility], [address] [place], [place] [address]    Culture  Setup Time   Final    GRAM POSITIVE RODS IN BOTH AEROBIC AND ANAEROBIC BOTTLES CRITICAL RESULT CALLED TO, READ BACK BY AND VERIFIED WITH: MORGAN HICKS AT 1927 06/20/21.PMF    Culture (A)  Final    DIPHTHEROIDS(CORYNEBACTERIUM SPECIES) Standardized susceptibility testing for this organism is not available. Performed at [facility], [place] [address], [place], [place] [address]    Report Status 06/23/2021 FINAL  Final  Resp Panel by RT-PCR (Flu A&B, Covid) Nasopharyngeal Swab     Status: Abnormal   Collection Time: 06/19/21 12:00 PM   Specimen: Nasopharyngeal Swab; Nasopharyngeal(NP) swabs in vial transport medium  Result Value Ref Range Status   SARS Coronavirus 2 by RT PCR POSITIVE (A) NEGATIVE Final    Comment: RESULT CALLED TO, READ BACK BY AND VERIFIED WITH: C/RONNIE PFLUGER 06/19/21 1344 AMK (NOTE) SARS-CoV-2 target nucleic acids are DETECTED.  The  SARS-CoV-2 RNA is generally detectable in upper respiratory specimens during the acute phase of infection. Positive results are indicative of the presence of the identified virus, but do not rule out bacterial infection or co-infection with other pathogens not detected by the test. Clinical correlation with patient history and other diagnostic information is necessary to determine patient infection status. The expected result is Negative.  Fact Sheet for Patients: EntrepreneurPulse.com.au  Fact Sheet for Healthcare Providers: IncredibleEmployment.be  This test is not yet approved or cleared by the [place] FDA and  has been authorized for detection and/or diagnosis of SARS-CoV-2 by FDA under an  Emergency Use Authorization (EUA).  This EUA will remain in effect (meaning this test can be  used) for the duration of  the COVID-19 declaration under Section 564(b)(1) of the Act, 21 U.S.C. section 360bbb-3(b)(1), unless the authorization is terminated or revoked sooner.     Influenza A by PCR NEGATIVE NEGATIVE Final   Influenza B by PCR NEGATIVE NEGATIVE Final    Comment: (NOTE) The Xpert Xpress SARS-CoV-2/FLU/RSV plus assay is intended as an aid in the diagnosis of influenza from Nasopharyngeal swab specimens and should not be used as a sole basis for treatment. Nasal washings and aspirates are unacceptable for Xpert Xpress SARS-CoV-2/FLU/RSV testing.  Fact Sheet for Patients: EntrepreneurPulse.com.au  Fact Sheet for Healthcare Providers: IncredibleEmployment.be  This test is not yet approved or cleared by the [place] FDA and has been authorized for detection and/or diagnosis of SARS-CoV-2 by FDA under an Emergency Use Authorization (EUA). This EUA will remain in effect (meaning this test can be used) for the duration of the COVID-19 declaration under Section 564(b)(1) of the Act, 21 U.S.C. section  360bbb-3(b)(1), unless the authorization is terminated or revoked.  Performed at [facility], [address] [place], [place] [address]   Blood culture (single)     Status: None   Collection Time: 06/19/21  1:18 PM   Specimen: BLOOD  Result Value Ref Range Status   Specimen Description BLOOD LEFT ANTECUBITAL  Final   Special Requests   Final    BOTTLES DRAWN AEROBIC AND ANAEROBIC Blood Culture adequate volume   Culture   Final    NO GROWTH 5 DAYS Performed at [facility], [address] [place], [place] [address]    Report Status 06/24/2021 FINAL  Final  Urine Culture     Status: Abnormal   Collection Time: 06/19/21  2:30 PM   Specimen: Urine, Clean Catch  Result Value Ref Range Status   Specimen Description   Final    URINE, CLEAN CATCH Performed at [facility], [address] [place], [place] [address]    Special Requests   Final    NONE Performed at [facility], [address], [place], [place] [address]    Culture >=100,000 COLONIES/mL CITROBACTER KOSERI (A)  Final   Report Status 06/22/2021 FINAL  Final   Organism ID, Bacteria CITROBACTER KOSERI (A)  Final      Susceptibility   Citrobacter koseri - MIC*    CEFAZOLIN <=4 SENSITIVE Sensitive     CEFEPIME <=0.12 SENSITIVE Sensitive     CEFTRIAXONE <=0.25 SENSITIVE Sensitive     CIPROFLOXACIN <=0.25 SENSITIVE Sensitive     GENTAMICIN <=1 SENSITIVE Sensitive     IMIPENEM <=0.25 SENSITIVE Sensitive     NITROFURANTOIN 32 SENSITIVE Sensitive     TRIMETH/SULFA <=20 SENSITIVE Sensitive     PIP/TAZO <=4 SENSITIVE Sensitive     * >=100,000 COLONIES/mL CITROBACTER KOSERI     Scheduled Meds:  acidophilus  2 capsule Oral TID   allopurinol  300 mg Oral Daily   carbidopa-levodopa  1.5 tablet Oral TID   DULoxetine  60 mg Oral BID   gabapentin  600 mg Oral QID   levothyroxine  50 mcg Oral Q0600   meloxicam  15 mg Oral Daily   oxybutynin  5 mg Oral BID   pantoprazole  40 mg Oral  Daily   sodium chloride flush  3 mL Intravenous Q12H   vitamin B-12  1,000 mcg Oral Daily   Warfarin - Pharmacist Dosing Inpatient  Does not apply q1600     LOS: 10 days   Cherene Altes, MD [facility] Office  [phone] Pager - Text Page per Amion  If 7PM-7AM, please contact night-coverage per Amion 06/29/2021, 10:37 AM

## 2021-06-29 NOTE — Consult Note (Signed)
Suzanne Dixon for warfarin  Indication: atrial fibrillation  Patient Measurements: Height: 5\' 4"  (162.6 cm) Weight: 113.3 kg (249 lb 12.8 oz) IBW/kg (Calculated) : 54.7  Vital Signs: Temp: 97.4 F (36.3 C) (10/11 0355) Temp Source: Oral (10/11 0355) BP: 119/74 (10/11 0355) Pulse Rate: 103 (10/11 0355)  Labs: Recent Labs    06/27/21 0502 06/28/21 0425 06/29/21 0535  HGB  --  12.1  --   HCT  --  36.2  --   PLT  --  215  --   LABPROT 30.8* 27.9* 30.1*  INR 3.0* 2.6* 2.9*  CREATININE 0.65 0.58  --      Estimated Creatinine Clearance: 78.4 mL/min (by C-G formula based on SCr of 0.58 mg/dL).   Medical History: Past Medical History:  Diagnosis Date   A-fib ([place])    Anemia    B12 deficiency    Breast cancer ([place]) 03/31/2013   Right - chemo- mastectomy   Breast cancer ([place]) 1991   Rt.- radiation   Complication of anesthesia 01/2009   Recent years with general anesthesia had itching following surgery   DDD (degenerative disc disease), cervical    Depression    Diverticulitis    Dyspnea    Edema of both legs    GERD (gastroesophageal reflux disease)    Gout    H/O mastectomy 2014   per patient   H/O: cesarean section 1987   per patient report   H/O: hysterectomy 1996   per patient   Hydradenitis    Hypertension    Hypothyroidism    Neuropathy    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 female with hx of A. fib, breast cancer, CVA, polyneuropathy, lymphocytic leukemia, presents with altered mental status and found to be COVID positive. Pharmacy has been consulted for warfarin dosing for A. Fib    DDIs:  Warfarin + levothyroxine >> may increase  INR Warfarin + meloxicam >> may increase risk of bleeding. Warfarin + allopurinol >> may increase warfarin effect  Date INR Dose 9/30 -- 7 mg  10/1 1.4 10 mg 10/2 1.4 7.5 mg 10/3 1.7 7.5 mg 10/4 1.7 10 mg 10/5 2  7  mg 10/6 2.5  7 mg  10/7 3.1  HOLD 10/8 3.3  HOLD 10/9 3.0 5 mg 10/10 2.6 5 mg 10/11   2.9   Goal of Therapy:  INR 2-3 Monitor platelets by anticoagulation protocol: Yes   Plan:  INR therapeutic, trend is now back up Will give warfarin 2.5 mg x1 again today (reduced from home dose of 7 mg daily).  Daily INR ordered CBC at least every 3 days  Dallie Piles, PharmD Clinical Pharmacist 06/29/2021 7:08 AM

## 2021-06-29 NOTE — TOC Progression Note (Signed)
Transition of Care [facility]) - Progression Note    Patient Details  Name: Suzanne Dixon MRN: [id number] Date of Birth: 07/15/1951  Transition of [facility]) CM/SW Contact  Candie Chroman, LCSW Phone Number: 06/29/2021, 12:29 PM  Clinical Narrative:   PASARR and SNF insurance authorization still pending.  Expected Discharge Plan: Skilled Nursing Facility Barriers to Discharge: Ship broker, [place] Forensic scientist)  Expected Discharge Plan and Services Expected Discharge Plan: [place] Choice: Resumption of Svcs/PTA Provider Living arrangements for the past 2 months: Single Family Home                           HH Arranged: RN, PT, OT, Nurse's Aide, Social Work [employer] Agency: [place] Date [place]: 06/21/21   Representative spoke with at [place]: [place] [employer]   Social Determinants of Health ([place]) Interventions    Readmission Risk Interventions Readmission Risk Prevention Plan 06/21/2021  Transportation Screening Complete  PCP or Specialist Appt within 3-5 Days Complete  HRI or [place] Complete  Social Work Consult for [place] Planning/Counseling Complete  Palliative Care Screening Not Applicable  Medication Review Press photographer) Complete  Some recent data might be hidden

## 2021-06-30 DIAGNOSIS — U071 COVID-19: Secondary | ICD-10-CM | POA: Diagnosis not present

## 2021-06-30 LAB — PROTIME-INR
INR: 2.6 — ABNORMAL HIGH (ref 0.8–1.2)
Prothrombin Time: 28.1 seconds — ABNORMAL HIGH (ref 11.4–15.2)

## 2021-06-30 MED ORDER — WARFARIN SODIUM 6 MG PO TABS
7.0000 mg | ORAL_TABLET | Freq: Once | ORAL | Status: AC
  Administered 2021-06-30: 7 mg via ORAL
  Filled 2021-06-30: qty 1

## 2021-06-30 NOTE — Consult Note (Signed)
[place] for warfarin  Indication: atrial fibrillation  Patient Measurements: Height: 5\' 4"  (162.6 cm) Weight: 113.3 kg (249 lb 12.8 oz) IBW/kg (Calculated) : 54.7  Vital Signs: Temp: 97.5 F (36.4 C) (10/12 0555) Temp Source: Oral (10/12 0555) BP: 130/79 (10/12 0555) Pulse Rate: 93 (10/12 0555)  Labs: Recent Labs    06/28/21 0425 06/29/21 0535 06/30/21 0533  HGB 12.1  --   --   HCT 36.2  --   --   PLT 215  --   --   LABPROT 27.9* 30.1* 28.1*  INR 2.6* 2.9* 2.6*  CREATININE 0.58  --   --      Estimated Creatinine Clearance: 78.4 mL/min (by C-G formula based on SCr of 0.58 mg/dL).   Medical History: Past Medical History:  Diagnosis Date   A-fib ([place])    Anemia    B12 deficiency    Breast cancer ([place]) 03/31/2013   Right - chemo- mastectomy   Breast cancer ([place]) 1991   Rt.- radiation   Complication of anesthesia 01/2009   Recent years with general anesthesia had itching following surgery   DDD (degenerative disc disease), cervical    Depression    Diverticulitis    Dyspnea    Edema of both legs    GERD (gastroesophageal reflux disease)    Gout    H/O mastectomy 2014   per patient   H/O: cesarean section 1987   per patient report   H/O: hysterectomy 1996   per patient   Hydradenitis    Hypertension    Hypothyroidism    Neuropathy    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 female with hx of A. fib, breast cancer, CVA, polyneuropathy, lymphocytic leukemia, presents with altered mental status and found to be COVID positive. Pharmacy has been consulted for warfarin dosing for A. Fib    DDIs:  Warfarin + levothyroxine >> may increase  INR Warfarin + meloxicam >> may increase risk of bleeding. Warfarin + allopurinol >> may increase warfarin effect  Date INR Dose 9/30 -- 7 mg  10/1 1.4 10 mg 10/2 1.4 7.5 mg 10/3 1.7 7.5 mg 10/4 1.7 10 mg 10/5 2  7  mg 10/6 2.5  7 mg  10/7 3.1  HOLD 10/8 3.3  HOLD 10/9 3.0 5 mg 10/10 2.6 5 mg 10/11   2.9 2.5 mg 10/12 2.6   Goal of Therapy:  INR 2-3 Monitor platelets by anticoagulation protocol: Yes   Plan:  INR therapeutic, trend is slightly down Will give warfarin 7 mg x1  today (equal to home dose).  Daily INR ordered CBC at least every 3 days  Dallie Piles, PharmD Clinical Pharmacist 06/30/2021 7:14 AM

## 2021-06-30 NOTE — Progress Notes (Signed)
Suzanne Dixon  OZD:[id number] DOB: 1950-12-21 DOA: 06/19/2021 PCP: Adin Hector, MD    Brief Narrative:  70yo with a hx of A. fib, Parkinson's disease, breast cancer, CVA, polyneuropathy, and lymphocytic leukemia, who presented with altered mental status. She reports she had COVID 2 months prior. She endorses body aches and chills.  Denies any other symptoms.  She is unsure where she is. COVID test returned positive.  UA showed extremely concentrated urine and elevated pH.  Blood cultures were obtained.  CT chest abdomen and pelvis was obtained which showed no PE and no acute findings in the abdomen.  CT head without contrast was also unremarkable.  She was given a 1 L normal saline bolus, started on broad-spectrum antibiotics, and admitted for further management.  Consultants:  None  Code Status: FULL CODE  Date of Positive COVID Test:  06/19/2021 (last day of isolation 06/29/21)  COVID-19 specific Treatment: Remdesivir 10/1 > 10/5  Antimicrobials:  Cefepime 10/1 Flagyl 10/1 Vancomycin 10/1 Keflex 10/4 >  DVT prophylaxis: Warfarin  Subjective: No new complaints.  Still substantial weaker than baseline.   Insurance denied acute rehab.     Assessment & Plan:  Headache / dental pain No further headache and dental pain reported as mild/nearly resolved  Acute metabolic encephalopathy - acute delirium Felt to be due to dehydration related to COVID -resolved with fluid resuscitation   COVID infection Was dosed with Remdesivir earlier in hospital stay - last day on inpatient isolation is 06/29/21   Lactic acidosis - transient hypotension  Due to simple severe dehydration/volume depletion - resolved with IV fluid resuscitation - stopped lasix and BB for now   Elevated troponin Mild w/o sharp peaking pattern - no chest pain - no further evaluation indicated   Hypomagnesemia Due to poor oral intake - supplemented  Citrobacter UTI Completed a course of antibiotic tx  -presently asymptomatic  Gout Continue allopurinol  Parkinson's disease Continue Sinemet -well compensated  Chronic neuropathy Continue Cymbalta and gabapentin  Chronic bilateral lower extremity edema Lasix prn as appropriate based upon volume status and symptoms -on hold for now due to poor oral intake  Hypothyroidism Continue usual Synthroid  Atrial fibrillation Continue beta-blocker and warfarin  GERD --cont PPI  Family Communication: daughter updated at bedside today  Status is: Inpatient  Remains inpatient appropriate because:Unsafe d/c plan  Dispo: The patient is from: Home              Anticipated d/c is to: SNF              Patient currently is medically stable to d/c.   Difficult to place patient No   Objective: Blood pressure 91/69, pulse 68, temperature 98.2 F (36.8 C), temperature source Oral, resp. rate 17, height 5\' 4"  (1.626 m), weight 113.3 kg, SpO2 100 %.  Intake/Output Summary (Last 24 hours) at 06/30/2021 1811 Last data filed at 06/29/2021 1943 Gross per 24 hour  Intake 0 ml  Output 0 ml  Net 0 ml   Filed Weights   06/19/21 1258  Weight: 113.3 kg    Examination: Constitutional: NAD, AAOx3, sitting in recliner HEENT: conjunctivae and lids normal, EOMI CV: No cyanosis.   RESP: normal respiratory effort, on RA Extremities: mild edema in BLE SKIN: warm, dry Neuro: II - XII grossly intact.   Psych: Normal mood and affect.  Appropriate judgement and reason   CBC: Recent Labs  Lab 06/26/21 0433 06/28/21 0425  WBC 5.8 5.6  HGB 13.1  12.1  HCT 39.6 36.2  MCV 93.6 94.5  PLT 218 222   Basic Metabolic Panel: Recent Labs  Lab 06/25/21 0636 06/27/21 0502 06/28/21 0425  NA 138 140 137  K 4.5 4.3 4.3  CL 101 106 105  CO2 30 30 26   GLUCOSE 93 84 83  BUN 16 16 12   CREATININE 0.74 0.65 0.58  CALCIUM 8.6* 8.3* 8.5*   GFR: Estimated Creatinine Clearance: 78.4 mL/min (by C-G formula based on SCr of 0.58 mg/dL).   Coagulation  Profile: Recent Labs  Lab 06/26/21 0433 06/27/21 0502 06/28/21 0425 06/29/21 0535 06/30/21 0533  INR 3.3* 3.0* 2.6* 2.9* 2.6*    HbA1C: Hgb A1c MFr Bld  Date/Time Value Ref Range Status  02/02/2021 05:55 AM 5.7 (H) 4.8 - 5.6 % Final    Comment:    (NOTE) Pre diabetes:          5.7%-6.4%  Diabetes:              >6.4%  Glycemic control for   <7.0% adults with diabetes     No results found for this or any previous visit (from the past 240 hour(s)).    Scheduled Meds:  allopurinol  300 mg Oral Daily   carbidopa-levodopa  1.5 tablet Oral TID   DULoxetine  60 mg Oral BID   gabapentin  600 mg Oral QID   levothyroxine  50 mcg Oral Q0600   meloxicam  15 mg Oral Daily   oxybutynin  5 mg Oral BID   pantoprazole  40 mg Oral Daily   sodium chloride flush  3 mL Intravenous Q12H   vitamin B-12  1,000 mcg Oral Daily   Warfarin - Pharmacist Dosing Inpatient   Does not apply [id number]     LOS: 11 days    If 7PM-7AM, please contact night-coverage per Amion 06/30/2021, 6:11 PM

## 2021-06-30 NOTE — TOC Progression Note (Addendum)
Transition of Care [facility]) - Progression Note    Patient Details  Name: Suzanne Dixon MRN: [id number] Date of Birth: January 03, 1951  [facility] CM/SW Contact  Candie Chroman, LCSW Phone Number: 06/30/2021, 8:31 AM  Clinical Narrative:   PASARR still pending.  9:47 am: PASARR obtained: [id number] F. Expires 12/11. Insurance authorization still pending. SNF admissions coordinator will call [employer] after their morning meeting.  12:34 pm: Insurance auth still under review. [facility] admissions coordinator will call again around 1:00. Called and updated patient. Left voicemail for daughter [name].  2:37 pm: Auth still pending.  3:01 pm: Received initial denial from [place]. Sent peer-to-peer information to MD. She will have to call by noon tomorrow to schedule. Patient and daughter [name] are aware. [name] asked about personal care services. She and patient are agreeable to referral to [facility]. Referral made to [facility].  Expected Discharge Plan: Skilled Nursing Facility Barriers to Discharge: Ship broker, [place] Forensic scientist)  Expected Discharge Plan and Services Expected Discharge Plan: [place] Choice: Resumption of Svcs/PTA Provider Living arrangements for the past 2 months: Single Family Home                           HH Arranged: RN, PT, OT, Nurse's Aide, Social Work [employer] Agency: [place] Date [place]: 06/21/21   Representative spoke with at [place]: [place] [employer]   Social Determinants of Health ([place]) Interventions    Readmission Risk Interventions Readmission Risk Prevention Plan 06/21/2021  Transportation Screening Complete  PCP or Specialist Appt within 3-5 Days Complete  HRI or [place] Complete  Social Work Consult for [place] Planning/Counseling Complete  Palliative Care Screening Not Applicable  Medication Review Press photographer) Complete  Some recent  data might be hidden

## 2021-07-01 DIAGNOSIS — U071 COVID-19: Secondary | ICD-10-CM | POA: Diagnosis not present

## 2021-07-01 LAB — PROTIME-INR
INR: 2.7 — ABNORMAL HIGH (ref 0.8–1.2)
Prothrombin Time: 28.4 seconds — ABNORMAL HIGH (ref 11.4–15.2)

## 2021-07-01 MED ORDER — WARFARIN SODIUM 6 MG PO TABS
7.0000 mg | ORAL_TABLET | Freq: Once | ORAL | Status: AC
  Administered 2021-07-01: 7 mg via ORAL
  Filled 2021-07-01: qty 1

## 2021-07-01 MED ORDER — METOPROLOL TARTRATE 50 MG PO TABS
100.0000 mg | ORAL_TABLET | Freq: Two times a day (BID) | ORAL | Status: DC
  Administered 2021-07-01 – 2021-07-02 (×2): 100 mg via ORAL
  Filled 2021-07-01 (×2): qty 2

## 2021-07-01 NOTE — Progress Notes (Addendum)
Mobility Specialist - Progress Note   07/01/21 1000  Mobility  Activity Ambulated in room  Range of Motion/Exercises Right arm;Left arm  Level of Assistance Moderate assist, patient does 50-74%  Assistive Device Front wheel walker  Distance Ambulated (ft) 25 ft  Mobility Ambulated with assistance in room;Out of bed for toileting;Out of bed to chair with meals  Mobility Response Tolerated well  Mobility performed by Mobility specialist  $Mobility charge 1 Mobility    Pre-mobility: 94 HR, 96% SpO2 During mobility: 107 HR, 95% SpO2 Post-mobility: 106 HR, 94% SpO2   Pt lying in bed upon arrival, utilizing RA. [place] for transfers. Pt transferred to recliner and participated in seated UB therex. Voiced pain in L shoulder and some numbness in hands, states she's been having difficulty with gripping items. Ambulated in room with RW and supervision. Pain in LE resulting in a seated rest break. Pt left in recliner with needs in reach.    Kathee Delton Mobility Specialist 07/01/21, 11:09 AM

## 2021-07-01 NOTE — Progress Notes (Signed)
   07/01/21 0842  Assess: MEWS Score  BP 116/70  Pulse Rate (!) 117  Resp 17  SpO2 100 %  O2 Device Room Air  Assess: MEWS Score  MEWS Temp 0  MEWS Systolic 0  MEWS Pulse 2  MEWS RR 0  MEWS LOC 0  MEWS Score 2  MEWS Score Color Yellow  Assess: if the MEWS score is Yellow or Red  Were vital signs taken at a resting state? Yes  Focused Assessment No change from prior assessment  Treat  MEWS Interventions Administered prn meds/treatments  Pain Scale 0-10  Pain Score 0  Patients Stated Pain Goal 0  Take Vital Signs  Increase Vital Sign Frequency  Yellow: Q 2hr X 2 then Q 4hr X 2, if remains yellow, continue Q 4hrs  Escalate  MEWS: Escalate Yellow: discuss with charge nurse/RN and consider discussing with provider and RRT  Notify: Charge Nurse/RN  Name of Charge Nurse/RN Notified Erica RN  Date Charge Nurse/RN Notified 07/01/21  Time Charge Nurse/RN Notified 0855  Assess: SIRS CRITERIA  SIRS Temperature  0  SIRS Pulse 1  SIRS Respirations  0  SIRS WBC 0  SIRS Score Sum  1

## 2021-07-01 NOTE — TOC Progression Note (Signed)
Transition of Care [facility]) - Progression Note    Patient Details  Name: Suzanne Dixon MRN: [id number] Date of Birth: April 04, 1951  Transition of Care [facility]) CM/SW [place], LCSW Phone Number: 07/01/2021, 4:27 PM  Clinical Narrative:   Insurance authorization approved. [facility] can [facility] patient tomorrow. MD, patient, and daughter aware.  Expected Discharge Plan: Skilled Nursing Facility Barriers to Discharge: Ship broker, [place] Forensic scientist)  Expected Discharge Plan and Services Expected Discharge Plan: [place] Choice: Resumption of Svcs/PTA Provider Living arrangements for the past 2 months: Single Family Home                           HH Arranged: RN, PT, OT, Nurse's Aide, Social Work [employer] Agency: [place] Date [place]: 06/21/21   Representative spoke with at [place]: [place] [employer]   Social Determinants of Health ([place]) Interventions    Readmission Risk Interventions Readmission Risk Prevention Plan 06/21/2021  Transportation Screening Complete  PCP or Specialist Appt within 3-5 Days Complete  HRI or [place] Complete  Social Work Consult for [place] Planning/Counseling Complete  Palliative Care Screening Not Applicable  Medication Review Press photographer) Complete  Some recent data might be hidden

## 2021-07-01 NOTE — Consult Note (Signed)
ANTICOAGULATION CONSULT NOTE   Pharmacy Consult for warfarin  Indication: atrial fibrillation  Patient Measurements: Height: 5\' 4"  (162.6 cm) Weight: 113.3 kg (249 lb 12.8 oz) IBW/kg (Calculated) : 54.7  Vital Signs: Temp: 98 F (36.7 C) (10/13 0456) Temp Source: Oral (10/13 0456) BP: 128/75 (10/13 0456) Pulse Rate: 110 (10/13 0456)  Labs: Recent Labs    06/29/21 0535 06/30/21 0533 07/01/21 0640  LABPROT 30.1* 28.1* 28.4*  INR 2.9* 2.6* 2.7*     Estimated Creatinine Clearance: 78.4 mL/min (by C-G formula based on SCr of 0.58 mg/dL).   Medical History: Past Medical History:  Diagnosis Date   A-fib ([place])    Anemia    B12 deficiency    Breast cancer ([place]) 03/31/2013   Right - chemo- mastectomy   Breast cancer ([place]) 1991   Rt.- radiation   Complication of anesthesia 01/2009   Recent years with general anesthesia had itching following surgery   DDD (degenerative disc disease), cervical    Depression    Diverticulitis    Dyspnea    Edema of both legs    GERD (gastroesophageal reflux disease)    Gout    H/O mastectomy 2014   per patient   H/O: cesarean section 1987   per patient report   H/O: hysterectomy 1996   per patient   Hydradenitis    Hypertension    Hypothyroidism    Neuropathy    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 female with hx of A. fib, breast cancer, CVA, polyneuropathy, lymphocytic leukemia, presents with altered mental status and found to be COVID positive. Pharmacy has been consulted for warfarin dosing for A. Fib    DDIs (all at-home medications):  Warfarin + levothyroxine >> may increase INR Warfarin + meloxicam >> may increase risk of  bleeding. Warfarin + allopurinol >> may increase warfarin effect  Date INR Dose 9/30 -- 7 mg  10/1 1.4 10 mg 10/2 1.4 7.5 mg 10/3 1.7 7.5 mg 10/4 1.7 10 mg 10/5 2  7  mg 10/6 2.5  7 mg  10/7 3.1  HOLD 10/8 3.3  HOLD 10/9 3.0 5 mg 10/10 2.6 5 mg 10/11   2.9 2.5 mg 10/12 2.6 7 mg 10/13 2.7 7 mg  Goal of Therapy:  INR 2-3 Monitor platelets by anticoagulation protocol: Yes   Plan:  INR therapeutic, trend is flat Will give warfarin 7 mg x1  today (equal to home dose).  Daily INR ordered CBC at least every 7 days  Dallie Piles, PharmD Clinical Pharmacist 07/01/2021 7:49 AM

## 2021-07-01 NOTE — Progress Notes (Signed)
Suzanne Dixon  NOM:[id number] DOB: 1951-08-26 DOA: 06/19/2021 PCP: Adin Hector, MD    Brief Narrative:  70yo with a hx of A. fib, Parkinson's disease, breast cancer, CVA, polyneuropathy, and lymphocytic leukemia, who presented with altered mental status. She reports she had COVID 2 months prior. She endorses body aches and chills.  Denies any other symptoms.  She is unsure where she is. COVID test returned positive.  UA showed extremely concentrated urine and elevated pH.  Blood cultures were obtained.  CT chest abdomen and pelvis was obtained which showed no PE and no acute findings in the abdomen.  CT head without contrast was also unremarkable.  She was given a 1 L normal saline bolus, started on broad-spectrum antibiotics, and admitted for further management.  Consultants:  None  Code Status: FULL CODE  Date of Positive COVID Test:  06/19/2021 (last day of isolation 06/29/21)  COVID-19 specific Treatment: Remdesivir 10/1 > 10/5  Antimicrobials:  Cefepime 10/1 Flagyl 10/1 Vancomycin 10/1 Keflex 10/4 >  DVT prophylaxis: Warfarin  Subjective: Pt reported chronic pain in both knees, R>L.  [facility], and pt now approved for 7 days of SNF rehab.   Assessment & Plan:  Headache / dental pain No further headache and dental pain reported as mild/nearly resolved  Acute metabolic encephalopathy - acute delirium Felt to be due to dehydration related to COVID -resolved with fluid resuscitation   COVID infection Was dosed with Remdesivir earlier in hospital stay - last day on inpatient isolation is 06/29/21   Lactic acidosis - transient hypotension  Due to simple severe dehydration/volume depletion - resolved with IV fluid resuscitation   Elevated troponin likely due to demand ischemia --trop 100's flat  Hypomagnesemia Due to poor oral intake - supplemented  Citrobacter UTI Completed a course of antibiotic tx -presently asymptomatic  Gout Continue  allopurinol  Parkinson's disease Continue Sinemet -well compensated  Chronic neuropathy Continue Cymbalta and gabapentin  Chronic bilateral lower extremity edema Lasix prn as appropriate based upon volume status and symptoms -on hold for now due to poor oral intake  Hypothyroidism Continue usual Synthroid  Atrial fibrillation --resume home metop today at reduced 100 mg BID --cont warfarin  GERD --cont PPI  Morbid obesity, BMI 42   Family Communication: daughter updated at bedside today  Status is: Inpatient  Dispo: The patient is from: Home              Anticipated d/c is to: SNF tomorrow              Patient currently is medically stable to d/c.   Difficult to place patient No   Objective: Blood pressure 101/64, pulse 84, temperature (!) 97.4 F (36.3 C), temperature source Oral, resp. rate 20, height 5\' 4"  (1.626 m), weight 113.3 kg, SpO2 91 %.  Intake/Output Summary (Last 24 hours) at 07/01/2021 1615 Last data filed at 07/01/2021 1300 Gross per 24 hour  Intake 540 ml  Output 900 ml  Net -360 ml   Filed Weights   06/19/21 1258  Weight: 113.3 kg    Examination: Constitutional: NAD, AAOx3 HEENT: conjunctivae and lids normal, EOMI CV: No cyanosis.   RESP: normal respiratory effort, on RA Neuro: II - XII grossly intact.   Psych: Normal mood and affect.  Appropriate judgement and reason   CBC: Recent Labs  Lab 06/26/21 0433 06/28/21 0425  WBC 5.8 5.6  HGB 13.1 12.1  HCT 39.6 36.2  MCV 93.6 94.5  PLT  218 935   Basic Metabolic Panel: Recent Labs  Lab 06/25/21 0636 06/27/21 0502 06/28/21 0425  NA 138 140 137  K 4.5 4.3 4.3  CL 101 106 105  CO2 30 30 26   GLUCOSE 93 84 83  BUN 16 16 12   CREATININE 0.74 0.65 0.58  CALCIUM 8.6* 8.3* 8.5*   GFR: Estimated Creatinine Clearance: 78.4 mL/min (by C-G formula based on SCr of 0.58 mg/dL).   Coagulation Profile: Recent Labs  Lab 06/27/21 0502 06/28/21 0425 06/29/21 0535 06/30/21 0533  07/01/21 0640  INR 3.0* 2.6* 2.9* 2.6* 2.7*    HbA1C: Hgb A1c MFr Bld  Date/Time Value Ref Range Status  02/02/2021 05:55 AM 5.7 (H) 4.8 - 5.6 % Final    Comment:    (NOTE) Pre diabetes:          5.7%-6.4%  Diabetes:              >6.4%  Glycemic control for   <7.0% adults with diabetes     No results found for this or any previous visit (from the past 240 hour(s)).    Scheduled Meds:  allopurinol  300 mg Oral Daily   carbidopa-levodopa  1.5 tablet Oral TID   DULoxetine  60 mg Oral BID   gabapentin  600 mg Oral QID   levothyroxine  50 mcg Oral Q0600   meloxicam  15 mg Oral Daily   metoprolol tartrate  100 mg Oral BID   oxybutynin  5 mg Oral BID   pantoprazole  40 mg Oral Daily   sodium chloride flush  3 mL Intravenous Q12H   vitamin B-12  1,000 mcg Oral Daily   warfarin  7 mg Oral ONCE-1600   Warfarin - Pharmacist Dosing Inpatient   Does not apply [id number]     LOS: 12 days    If 7PM-7AM, please contact night-coverage per Amion 07/01/2021, 4:15 PM

## 2021-07-01 NOTE — Progress Notes (Signed)
   07/01/21 0842 07/01/21 1202  Assess: MEWS Score  Temp  --  98.5 F (36.9 C)  BP 116/70 96/70  Pulse Rate (!) 117 86  Resp 17 20  SpO2  --  91 %  O2 Device  --  Room Air  Assess: MEWS Score  MEWS Temp 0 0  MEWS Systolic 0 1  MEWS Pulse 2 0  MEWS RR 0 0  MEWS LOC 0 0  MEWS Score 2 1  MEWS Score Color Yellow Green  Assess: SIRS CRITERIA  SIRS Temperature  0 0  SIRS Pulse 1 0  SIRS Respirations  0 0  SIRS WBC 0 0  SIRS Score Sum  1 0

## 2021-07-01 NOTE — Progress Notes (Signed)
Pt on Yellow MEWS, HR 110-120, MD made aware and ordered/resumed 100mg  metoprolol. Will reassess.

## 2021-07-01 NOTE — Plan of Care (Signed)
Patient is A/O X 4. Patient rested well all night. No reports of pain. Vital signs stable. HR slightly elevated at 110. No further needs at this time. Will continue to monitor.  Problem: Education: Goal: Knowledge of risk factors and measures for prevention of condition will improve Outcome: Progressing   Problem: Respiratory: Goal: Will maintain a patent airway Outcome: Progressing Goal: Complications related to the disease process, condition or treatment will be avoided or minimized Outcome: Progressing   Problem: Clinical Measurements: Goal: Ability to maintain clinical measurements within normal limits will improve Outcome: Progressing Goal: Will remain free from infection Outcome: Progressing Goal: Diagnostic test results will improve Outcome: Progressing Goal: Respiratory complications will improve Outcome: Progressing Goal: Cardiovascular complication will be avoided Outcome: Progressing

## 2021-07-01 NOTE — Progress Notes (Signed)
Occupational Therapy Treatment Patient Details Name: Suzanne Dixon MRN: [id number] DOB: [birth year] Today's Date: 07/01/2021   History of present illness Pt is a 70 y.o. female with hx of A. fib, breast cancer, CVA, polyneuropathy, Parkinson's Dz, and lymphocytic leukemia who presented with altered mental status. MD assessment includes: AMS, delirium, acute metabolic encephalopathy, CBSWH-67 infection, lactic acidosis, elevated troponin, and LE edema.   OT comments  Upon entering the room, pt seated in recliner chair and reports having a good day. She is in her own clothing from home and pt reports needing ~ 50% assistance for bathing and dressing tasks. She does also endorse needing increasing assistance secondary to fatigue with tasks. Pt standing with min A and ambulating to bed. Pt with increased pain in B knees once standing to return to bed. Pt needing increased time and effort to get B LEs back into bed but able to do without assistance this session. All needs within reach. Pt continues to benefit from OT intervention.    Recommendations for follow up therapy are one component of a multi-disciplinary discharge planning process, led by the attending physician.  Recommendations may be updated based on patient status, additional functional criteria and insurance authorization.    Follow Up Recommendations  SNF;Supervision - Intermittent    Equipment Recommendations  None recommended by OT       Precautions / Restrictions Precautions Precautions: Fall       Mobility Bed Mobility Overal bed mobility: Needs Assistance Bed Mobility: Sit to Supine       Sit to supine: Min assist        Transfers Overall transfer level: Needs assistance Equipment used: Rolling walker (2 wheeled) Transfers: Sit to/from Stand Sit to Stand: Min assist Stand pivot transfers: Min assist       General transfer comment: Pt reports feeling very fatigues and B knees being painful    Balance  Overall balance assessment: Needs assistance   Sitting balance-Leahy Scale: Good Sitting balance - Comments: Able to sit without leaning or LOB   Standing balance support: Bilateral upper extremity supported;During functional activity Standing balance-Leahy Scale: Fair Standing balance comment: B UE support                           ADL either performed or assessed with clinical judgement     Vision Patient Visual Report: No change from baseline            Cognition Arousal/Alertness: Awake/alert Behavior During Therapy: WFL for tasks assessed/performed Overall Cognitive Status: Within Functional Limits for tasks assessed                                 General Comments: pleasant and cooperative                   Pertinent Vitals/ Pain       Pain Assessment: Faces Faces Pain Scale: Hurts little more Pain Location: B knees Pain Descriptors / Indicators: Aching;Discomfort Pain Intervention(s): Limited activity within patient's tolerance;Monitored during session;Repositioned         Frequency  Min 2X/week        Progress Toward Goals  OT Goals(current goals can now be found in the care plan section)  Progress towards OT goals: Progressing toward goals  Acute Rehab OT Goals Patient Stated Goal: To get stronger and go home OT Goal Formulation: With patient/family Time  For Goal Achievement: 07/05/21 Potential to Achieve Goals: Good  Plan Discharge plan remains appropriate;Frequency remains appropriate       AM-PAC OT "6 Clicks" Daily Activity     Outcome Measure   Help from another person eating meals?: None Help from another person taking care of personal grooming?: A Little Help from another person toileting, which includes using toliet, bedpan, or urinal?: A Little Help from another person bathing (including washing, rinsing, drying)?: A Little Help from another person to put on and taking off regular upper body clothing?: A  Little Help from another person to put on and taking off regular lower body clothing?: A Lot 6 Click Score: 18    End of Session    OT Visit Diagnosis: Unsteadiness on feet (R26.81);Repeated falls (R29.6);Muscle weakness (generalized) (M62.81)   Activity Tolerance Patient tolerated treatment well   Patient Left with call bell/phone within reach;in chair;with chair alarm set   Nurse Communication Mobility status        Time: 1610-9604 OT Time Calculation (min): 14 min  Charges: OT General Charges $OT Visit: 1 Visit OT Treatments $Therapeutic Activity: 8-22 mins  Darleen Crocker, MS, OTR/L , CBIS ascom [phone]  07/01/21, 3:13 PM

## 2021-07-01 NOTE — Plan of Care (Signed)

## 2021-07-02 DIAGNOSIS — U071 COVID-19: Secondary | ICD-10-CM | POA: Diagnosis not present

## 2021-07-02 LAB — PROTIME-INR
INR: 3.1 — ABNORMAL HIGH (ref 0.8–1.2)
Prothrombin Time: 32.2 seconds — ABNORMAL HIGH (ref 11.4–15.2)

## 2021-07-02 MED ORDER — WARFARIN SODIUM 6 MG PO TABS: 6.0000 mg | ORAL_TABLET | Freq: Every day | ORAL | Status: DC

## 2021-07-02 MED ORDER — WARFARIN SODIUM 2.5 MG PO TABS
2.5000 mg | ORAL_TABLET | Freq: Once | ORAL | Status: DC
  Filled 2021-07-02: qty 1

## 2021-07-02 MED ORDER — HYDROCORTISONE 2.5 % EX CREA: TOPICAL_CREAM | Freq: Two times a day (BID) | CUTANEOUS | 0 refills | Status: DC | PRN

## 2021-07-02 MED ORDER — TRIAMCINOLONE ACETONIDE 0.025 % EX CREA: 1.0000 "application " | TOPICAL_CREAM | Freq: Two times a day (BID) | CUTANEOUS | 0 refills | Status: DC | PRN

## 2021-07-02 MED ORDER — METOPROLOL TARTRATE 100 MG PO TABS: 100.0000 mg | ORAL_TABLET | Freq: Two times a day (BID) | ORAL | Status: DC

## 2021-07-02 MED ORDER — FUROSEMIDE 40 MG PO TABS
ORAL_TABLET | ORAL | Status: DC
Start: 2021-07-02 — End: 2022-05-24

## 2021-07-02 MED ORDER — MELOXICAM 15 MG PO TABS: 15.0000 mg | ORAL_TABLET | Freq: Every day | ORAL | Status: DC | PRN

## 2021-07-02 NOTE — Care Management Important Message (Signed)
Important Message  Patient Details  Name: Suzanne Dixon MRN: [id number] Date of Birth: Dec 28, 1950   Medicare Important Message Given:  Yes     Dannette Barbara 07/02/2021, 1:13 PM

## 2021-07-02 NOTE — TOC Transition Note (Signed)
Transition of Care [facility]) - CM/SW Discharge Note   Patient Details  Name: Suzanne Dixon MRN: [id number] Date of Birth: [birth year]  [facility]) CM/SW Contact:  Candie Chroman, LCSW Phone Number: 07/02/2021, 12:14 PM   Clinical Narrative:   Patient has orders to discharge to [facility] today. RN will call report to [phone] (Room 602B). EMS transport has been arranged and she is 2nd on the list. No further concerns. CSW signing off.  Final next level of care: Skilled Nursing Facility Barriers to Discharge: Barriers Resolved   Patient Goals and CMS Choice     Choice offered to / list presented to : Patient, Adult Children  Discharge Placement PASRR number recieved: 06/30/21            Patient chooses bed at: [facility] [place] Patient to be transferred to facility by: EMS Name of family member notified: Sheldon Silvan. Left vm for [employer]. Patient and family notified of of transfer: 07/02/21  Discharge Plan and Services     Post Acute Care Choice: Resumption of Svcs/PTA Provider                    HH Arranged: RN, PT, OT, Nurse's Aide, Social Work [employer] Agency: [place] Date [facility] Agency Contacted: 06/21/21   Representative spoke with at [place]: [place] [employer]  Social Determinants of Health ([place]) Interventions     Readmission Risk Interventions Readmission Risk Prevention Plan 06/21/2021  Transportation Screening Complete  PCP or Specialist Appt within 3-5 Days Complete  HRI or [place] Complete  Social Work Consult for [place] Planning/Counseling Complete  Palliative Care Screening Not Applicable  Medication Review Press photographer) Complete  Some recent data might be hidden

## 2021-07-02 NOTE — Plan of Care (Signed)
  Problem: Education: Goal: Knowledge of risk factors and measures for prevention of condition will improve Outcome: Progressing   Problem: Education: Goal: Knowledge of General Education information will improve Description: Including pain rating scale, medication(s)/side effects and non-pharmacologic comfort measures Outcome: Progressing   Problem: Respiratory: Goal: Will maintain a patent airway Outcome: Progressing Goal: Complications related to the disease process, condition or treatment will be avoided or minimized Outcome: Progressing   Problem: Nutrition: Goal: Adequate nutrition will be maintained Outcome: Progressing   Problem: Activity: Goal: Risk for activity intolerance will decrease Outcome: Progressing   Problem: Activity: Goal: Risk for activity intolerance will decrease Outcome: Progressing

## 2021-07-02 NOTE — Progress Notes (Signed)
Mobility Specialist - Progress Note   07/02/21 1100  Mobility  Activity Transferred:  Bed to chair;Transferred:  Chair to bed;Sat and stood x 3  Range of Motion/Exercises Active  Level of Assistance Minimal assist, patient does 75% or more  Assistive Device Front wheel walker  Distance Ambulated (ft) 6 ft  Mobility Ambulated with assistance in room;Sit up in bed/chair position for meals  Mobility Response Tolerated well  Mobility performed by Mobility specialist  $Mobility charge 1 Mobility    Pt sleeping on arrival, utilizing RA. Reports fatigue. Sat EOB with minA to participate in seated bathing/grooming tasks. Supervision to stand from elevated bed height. Extra time needed for tasks. MinA for LE and peri-care. Assist to don LB garments. C/o LE pain with standing. Several rest breaks needed. Still reporting some difficulty with grasping items. Fatigued after activity. Pt left in bed with needs in reach. Anticipating d/c.   Kathee Delton Mobility Specialist 07/02/21, 11:57 AM

## 2021-07-02 NOTE — Progress Notes (Signed)
Report given to Danae Chen, RN at [facility] who verbalized understanding and accepectance of patient.  Awaiting EMS transport.  PIV removed prior to discharge.

## 2021-07-02 NOTE — Consult Note (Signed)
[place] for warfarin  Indication: atrial fibrillation  Patient Measurements: Height: 5\' 4"  (162.6 cm) Weight: 116.8 kg (257 lb 6.4 oz) IBW/kg (Calculated) : 54.7  Vital Signs: Temp: 97.5 F (36.4 C) (10/14 0439) Temp Source: Oral (10/14 0439) BP: 126/84 (10/14 0439) Pulse Rate: 93 (10/14 0439)  Labs: Recent Labs    06/30/21 0533 07/01/21 0640 07/02/21 0504  LABPROT 28.1* 28.4* 32.2*  INR 2.6* 2.7* 3.1*     Estimated Creatinine Clearance: 79.8 mL/min (by C-G formula based on SCr of 0.58 mg/dL).   Medical History: Past Medical History:  Diagnosis Date   A-fib ([place])    Anemia    B12 deficiency    Breast cancer ([place]) 03/31/2013   Right - chemo- mastectomy   Breast cancer ([place]) 1991   Rt.- radiation   Complication of anesthesia 01/2009   Recent years with general anesthesia had itching following surgery   DDD (degenerative disc disease), cervical    Depression    Diverticulitis    Dyspnea    Edema of both legs    GERD (gastroesophageal reflux disease)    Gout    H/O mastectomy 2014   per patient   H/O: cesarean section 1987   per patient report   H/O: hysterectomy 1996   per patient   Hydradenitis    Hypertension    Hypothyroidism    Neuropathy    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[facility] [employer] [facility])    Parkinson's disease ([place])    Personal history of chemotherapy    Personal history of radiation therapy    Ruptured cervical disc 2002   per patient    Sleep apnea    cpap machine   Super obese    Tachycardia     Medications:  Warfarin 7 mg daily (TWD: 49 mg)   Assessment: 70 y.o. female with hx of A. fib, breast cancer, CVA, polyneuropathy, lymphocytic leukemia, presents with altered mental status and found to be COVID positive. Pharmacy has been consulted for warfarin dosing for A. Fib    DDIs (all at-home medications):  Warfarin + levothyroxine >> may increase INR Warfarin + meloxicam >> may increase risk of  bleeding. Warfarin + allopurinol >> may increase warfarin effect  Date INR Dose 9/30 -- 7 mg  10/1 1.4 10 mg 10/2 1.4 7.5 mg 10/3 1.7 7.5 mg 10/4 1.7 10 mg 10/5 2  7  mg 10/6 2.5  7 mg  10/7 3.1  HOLD 10/8 3.3  HOLD 10/9 3.0 5 mg 10/10 2.6 5 mg 10/11   2.9 2.5 mg 10/12 2.6 7 mg 10/13 2.7 7 mg 10/14  3.1   Goal of Therapy:  INR 2-3 Monitor platelets by anticoagulation protocol: Yes   Plan:  INR slightly supratherapeutic.  Will give warfarin  2.5 mg x1  today  Daily INR ordered CBC at least every 7 days  Noralee Space, PharmD Clinical Pharmacist 07/02/2021 7:25 AM

## 2021-07-02 NOTE — Discharge Summary (Signed)
Physician Discharge Summary   Suzanne Dixon  female DOB: 07-13-1951  WEX:[id number]  PCP: Adin Hector, MD  Admit date: 06/19/2021 Discharge date: 07/02/2021  Admitted From: home Disposition:  SNF CODE STATUS: Full code  Discharge Instructions     Discharge instructions   Complete by: As directed    Need INR check around 10/19. [facility] Course:  For full details, please see H&P, progress notes, consult notes and ancillary notes.  Briefly,  Suzanne Dixon is a 70yo with a hx of A. Fib on warfarin, Parkinson's disease, breast cancer, CVA, polyneuropathy, and lymphocytic leukemia, who presented with altered mental status.   She reports she had COVID 2 months prior. COVID test returned positive again.  CT chest abdomen and pelvis was obtained which showed no PE and no acute findings in the abdomen.  CT head without contrast was also unremarkable.  She was given a 1 L normal saline bolus, started on broad-spectrum antibiotics, and admitted for further management.  Headache / dental pain No further headache and dental pain reported as mild/nearly resolved   Acute metabolic encephalopathy - acute delirium Felt to be due to dehydration related to COVID -resolved with fluid resuscitation    COVID infection Was dosed with Remdesivir earlier in hospital stay - last day on inpatient isolation is 06/29/21    Lactic acidosis - transient hypotension  Due to simple severe dehydration/volume depletion - resolved with IV fluid resuscitation    Elevated troponin likely due to demand ischemia --trop 100's flat   Hypomagnesemia Due to poor oral intake - supplemented   Citrobacter UTI Completed a course of antibiotic tx -presently asymptomatic   Gout Continue allopurinol   Parkinson's disease Continue Sinemet -well compensated   Chronic neuropathy Continue Cymbalta and gabapentin   Chronic bilateral lower extremity edema On PRN lasix at home, however, due  to low BP, PRN lasix held pending outpatient followup.   Hypothyroidism Continue usual Synthroid   Atrial fibrillation on home warfarin --home metop resumed at reduced 100 mg BID due to soft BP. --pt take warfarin 7 mg nightly at home, however, pharm noted INR increased to >3 after a few days of 7 mg, therefore, warfarin dosing was changed to 6 mg q evening at discharge.   --Pt usually gets INR check monthly at [facility].  Pt will need INR check around 10/19.    GERD --cont PPI   Morbid obesity, BMI 44  Chronic bilateral knee pain --home Mobic daily PRN.  Pt said she will see ortho as outpatient.   Discharge Diagnoses:  Active Problems:   AF (paroxysmal atrial fibrillation) (HCC)   B12 deficiency   Depression, major, recurrent, moderate (HCC)   Breast cancer (HCC)   Large granular lymphocytic leukemia (HCC)   Polyneuropathy associated with underlying disease ([place])   CVA (cerebral vascular accident) ([place])   COVID-19 virus infection   30 Day Unplanned Readmission Risk Score    Flowsheet Row ED to Hosp-Admission (Current) from 06/19/2021 in [place]  30 Day Unplanned Readmission Risk Score (%) 20.28 Filed at 07/02/2021 0801       This score is the patient's risk of an unplanned readmission within 30 days of being discharged (0 -100%). The score is based on dignosis, age, lab data, medications, orders, and past utilization.   Low:  0-14.9   Medium: 15-21.9   High: 22-29.9   Extreme: 30 and above  Discharge Instructions:  Allergies as of 07/02/2021       Reactions   Advair Diskus [fluticasone-salmeterol] Other (See Comments)   Joint pain    Alendronate    Muscle pain   Singulair [montelukast] Other (See Comments)   "muscle pain"   Sulfa Antibiotics Hives   Venlafaxine Other (See Comments)   "altered mental status/tremors"        Medication List     STOP taking these medications    fluticasone 50  MCG/ACT nasal spray Commonly known as: FLONASE   HYDROcodone-acetaminophen 5-325 MG tablet Commonly known as: NORCO/VICODIN   potassium chloride 10 MEQ tablet Commonly known as: KLOR-CON   traMADol 50 MG tablet Commonly known as: ULTRAM       TAKE these medications    allopurinol 300 MG tablet Commonly known as: ZYLOPRIM Take 300 mg by mouth daily.   benzonatate 100 MG capsule Commonly known as: TESSALON Take 100 mg by mouth 3 (three) times daily as needed for cough.   Calcium Carb-Cholecalciferol 600-400 MG-UNIT Tabs Take 1 tablet by mouth 2 (two) times daily with a meal.   carbidopa-levodopa 25-100 MG tablet Commonly known as: SINEMET IR Take 1.5 tablets by mouth 3 (three) times daily.   DULoxetine 60 MG capsule Commonly known as: CYMBALTA Take 1 capsule by mouth 2 (two) times daily.   furosemide 40 MG tablet Commonly known as: LASIX Hold until outpatient followup due to low blood pressure and patient stayed euvolemic. What changed:  how much to take how to take this when to take this additional instructions   gabapentin 600 MG tablet Commonly known as: NEURONTIN Take 600 mg by mouth 4 (four) times daily.   hydrocortisone 2.5 % cream Apply topically 2 (two) times daily as needed. Home med. What changed:  when to take this reasons to take this additional instructions   levothyroxine 50 MCG tablet Commonly known as: SYNTHROID Take 50 mcg by mouth daily before breakfast.   Magnesium 400 MG Caps Take 400 mg by mouth daily.   meloxicam 15 MG tablet Commonly known as: MOBIC Take 1 tablet (15 mg total) by mouth daily as needed for pain. What changed:  when to take this reasons to take this   metoprolol tartrate 100 MG tablet Commonly known as: LOPRESSOR Take 1 tablet (100 mg total) by mouth 2 (two) times daily. Reduced from 200 mg. What changed:  how much to take additional instructions   Multiple Vitamins tablet Take 2 tablets by mouth  daily.   omeprazole 20 MG capsule Commonly known as: PRILOSEC Take 20 mg by mouth daily.   oxybutynin 5 MG tablet Commonly known as: DITROPAN Take 5 mg by mouth 2 (two) times daily.   PSORIASIS/ECZEMA RELIEF EX Apply 1 application topically daily as needed (eczema).   triamcinolone 0.025 % cream Commonly known as: KENALOG Apply 1 application topically 2 (two) times daily as needed. Home med. What changed:  when to take this reasons to take this additional instructions   vitamin B-12 1000 MCG tablet Commonly known as: CYANOCOBALAMIN Take 1,000 mcg by mouth daily.   warfarin 6 MG tablet Commonly known as: COUMADIN Take 1 tablet (6 mg total) by mouth daily at 4 PM. Reduced from 7 mg. What changed:  when to take this additional instructions Another medication with the same name was removed. Continue taking this medication, and follow the directions you see here.         Contact information for follow-up providers  Adin Hector, MD Follow up.   Specialty: Internal Medicine Contact information: [place] [id number] [phone]              Contact information for after-discharge care     Destination     [facility] Preferred SNF .   Service: Skilled Nursing Contact information: [place] [address] [phone]                     Allergies  Allergen Reactions   Advair Diskus [Fluticasone-Salmeterol] Other (See Comments)    Joint pain    Alendronate     Muscle pain   Singulair [Montelukast] Other (See Comments)    "muscle pain"   Sulfa Antibiotics Hives   Venlafaxine Other (See Comments)    "altered mental status/tremors"     The results of significant diagnostics from this hospitalization (including imaging, microbiology, ancillary and laboratory) are listed below for reference.   Consultations:   Procedures/Studies: CT HEAD WO  CONTRAST (5MM)  Result Date: 06/19/2021 CLINICAL DATA:  Mental status change EXAM: CT HEAD WITHOUT CONTRAST TECHNIQUE: Contiguous axial images were obtained from the base of the skull through the vertex without intravenous contrast. COMPARISON:  CT head dated Feb 01, 2021 FINDINGS: Brain: Mild chronic white matter ischemic change. No evidence of acute infarction, hemorrhage, hydrocephalus, extra-axial collection or mass lesion/mass effect. Vascular: No hyperdense vessel or unexpected calcification. Skull: Normal. Negative for fracture or focal lesion. Sinuses/Orbits: No acute finding. Other: None. IMPRESSION: No acute intracranial abnormality. Electronically Signed   By: Yetta Glassman M.D.   On: 06/19/2021 14:20   CT Angio Chest PE W and/or Wo Contrast  Result Date: 06/19/2021 CLINICAL DATA:  Nausea and vomiting. Altered from baseline. Feels bad. EXAM: CT ANGIOGRAPHY CHEST CT ABDOMEN AND PELVIS WITH CONTRAST TECHNIQUE: Multidetector CT imaging of the chest was performed using the standard protocol during bolus administration of intravenous contrast. Multiplanar CT image reconstructions and MIPs were obtained to evaluate the vascular anatomy. Multidetector CT imaging of the abdomen and pelvis was performed using the standard protocol during bolus administration of intravenous contrast. CONTRAST:  144mL OMNIPAQUE IOHEXOL 350 MG/ML SOLN COMPARISON:  06/11/2014 FINDINGS: CTA CHEST FINDINGS Cardiovascular: Heart size is normal. No pericardial effusion. Pulmonary arteries are well opacified. No acute pulmonary embolus. There is minimal atherosclerotic calcification of the thoracic aorta, not associated with aneurysm. Mediastinum/Nodes: The visualized portion of the thyroid gland has a normal appearance. No significant mediastinal, hilar, or axillary adenopathy. Esophagus is unremarkable. Lungs/Pleura: There is minimal subsegmental atelectasis in the RIGHT UPPER lobe. No pulmonary nodules, pleural effusions, or  infiltrates. Airways are patent. Musculoskeletal: Significant degenerative changes throughout the thoracic spine. No suspicious lytic or blastic lesions. Review of the MIP images confirms the above findings. CT ABDOMEN and PELVIS FINDINGS Hepatobiliary: Within the posterior segment of the RIGHT hepatic lobe there is a 7 millimeter low-attenuation mass which is not further characterized. Although not well seen on prior study, the small size limits adequate comparison. Gallbladder is absent., Pancreas: Unremarkable. No pancreatic ductal dilatation or surrounding inflammatory changes. Spleen: Normal in size without focal abnormality. Adrenals/Urinary Tract: Adrenal glands are normal. There are bilateral renal cysts, largest on the RIGHT measuring 4.4 centimeters in the LOWER pole region. No hydronephrosis. Ureters are unremarkable. The bladder and visualized portion of the urethra are normal. Stomach/Bowel: Uncomplicated appearance of Roux-en-Y anastomosis. No small bowel dilatation. Small bowel suture lines are identified  in the RIGHT mid abdomen and LEFT mid abdomen. Moderate stool burden. Scattered colonic diverticula without acute diverticulitis. The appendix is well seen and has a normal appearance. Vascular/Lymphatic: There is atherosclerotic calcification of the abdominal aorta not associated with aneurysm. No retroperitoneal or mesenteric adenopathy. Reproductive: Hysterectomy. Other: No ascites.  Prior RIGHT mastectomy. Musculoskeletal: Significant degenerative changes throughout the LOWER thoracic and lumbar spine. Review of the MIP images confirms the above findings. IMPRESSION: 1. Technically adequate exam showing no acute pulmonary embolus. 2.  Aortic atherosclerosis.  (ICD10-I70.0) 3. Cholecystectomy. 4. Bilateral renal cysts.  No acute urinary tract abnormality. 5. Postoperative changes in the small bowel. Uncomplicated Roux-en-Y anastomosis. 6. Prior hysterectomy. 7. Prior RIGHT mastectomy.  Electronically Signed   By: Nolon Nations M.D.   On: 06/19/2021 14:36   CT ABDOMEN PELVIS W CONTRAST  Result Date: 06/19/2021 CLINICAL DATA:  Nausea and vomiting. Altered from baseline. Feels bad. EXAM: CT ANGIOGRAPHY CHEST CT ABDOMEN AND PELVIS WITH CONTRAST TECHNIQUE: Multidetector CT imaging of the chest was performed using the standard protocol during bolus administration of intravenous contrast. Multiplanar CT image reconstructions and MIPs were obtained to evaluate the vascular anatomy. Multidetector CT imaging of the abdomen and pelvis was performed using the standard protocol during bolus administration of intravenous contrast. CONTRAST:  169mL OMNIPAQUE IOHEXOL 350 MG/ML SOLN COMPARISON:  06/11/2014 FINDINGS: CTA CHEST FINDINGS Cardiovascular: Heart size is normal. No pericardial effusion. Pulmonary arteries are well opacified. No acute pulmonary embolus. There is minimal atherosclerotic calcification of the thoracic aorta, not associated with aneurysm. Mediastinum/Nodes: The visualized portion of the thyroid gland has a normal appearance. No significant mediastinal, hilar, or axillary adenopathy. Esophagus is unremarkable. Lungs/Pleura: There is minimal subsegmental atelectasis in the RIGHT UPPER lobe. No pulmonary nodules, pleural effusions, or infiltrates. Airways are patent. Musculoskeletal: Significant degenerative changes throughout the thoracic spine. No suspicious lytic or blastic lesions. Review of the MIP images confirms the above findings. CT ABDOMEN and PELVIS FINDINGS Hepatobiliary: Within the posterior segment of the RIGHT hepatic lobe there is a 7 millimeter low-attenuation mass which is not further characterized. Although not well seen on prior study, the small size limits adequate comparison. Gallbladder is absent., Pancreas: Unremarkable. No pancreatic ductal dilatation or surrounding inflammatory changes. Spleen: Normal in size without focal abnormality. Adrenals/Urinary Tract:  Adrenal glands are normal. There are bilateral renal cysts, largest on the RIGHT measuring 4.4 centimeters in the LOWER pole region. No hydronephrosis. Ureters are unremarkable. The bladder and visualized portion of the urethra are normal. Stomach/Bowel: Uncomplicated appearance of Roux-en-Y anastomosis. No small bowel dilatation. Small bowel suture lines are identified in the RIGHT mid abdomen and LEFT mid abdomen. Moderate stool burden. Scattered colonic diverticula without acute diverticulitis. The appendix is well seen and has a normal appearance. Vascular/Lymphatic: There is atherosclerotic calcification of the abdominal aorta not associated with aneurysm. No retroperitoneal or mesenteric adenopathy. Reproductive: Hysterectomy. Other: No ascites.  Prior RIGHT mastectomy. Musculoskeletal: Significant degenerative changes throughout the LOWER thoracic and lumbar spine. Review of the MIP images confirms the above findings. IMPRESSION: 1. Technically adequate exam showing no acute pulmonary embolus. 2.  Aortic atherosclerosis.  (ICD10-I70.0) 3. Cholecystectomy. 4. Bilateral renal cysts.  No acute urinary tract abnormality. 5. Postoperative changes in the small bowel. Uncomplicated Roux-en-Y anastomosis. 6. Prior hysterectomy. 7. Prior RIGHT mastectomy. Electronically Signed   By: Nolon Nations M.D.   On: 06/19/2021 14:36   DG Chest Portable 1 View  Result Date: 06/19/2021 CLINICAL DATA:  Altered mental status. EXAM: PORTABLE CHEST 1  VIEW COMPARISON:  02/01/2021 FINDINGS: Heart size is normal. No focal consolidations or pleural effusions. Mild bronchial wall thickening, similar to prior. No pulmonary edema. Remote cervical spine fusion and RIGHT shoulder arthroplasty. Chronic changes in the LEFT shoulder. IMPRESSION: Bronchitic changes.  No evidence for acute  abnormality. Electronically Signed   By: Nolon Nations M.D.   On: 06/19/2021 12:27   ECHOCARDIOGRAM COMPLETE  Result Date: 06/21/2021     ECHOCARDIOGRAM REPORT   Patient Name:   Suzanne Dixon Date of Exam: 06/21/2021 Medical Rec #:  [id number]      Height:       64.0 in Accession #:    [id number]     Weight:       249.8 lb Date of Birth:  [birth year]       BSA:          2.150 m Patient Age:    28 years       BP:           112/66 mmHg Patient Gender: F              HR:           63 bpm. Exam Location:  [facility] Procedure: 2D Echo, Color Doppler, Cardiac Doppler and Intracardiac            Opacification Agent Indications:     Elevated troponin  History:         Patient has prior history of Echocardiogram examinations, most                  recent 02/02/2021. Risk Factors:Sleep Apnea. Breast cancer.  Sonographer:     Charmayne Sheer Referring Phys:  [id number] [place] M ECKSTAT Diagnosing Phys: Donnelly Angelica  Sonographer Comments: Suboptimal parasternal window and suboptimal apical window. IMPRESSIONS  1. Left ventricular ejection fraction, by estimation, is 50 to 55%. The left ventricle has low normal function. The left ventricle has no regional wall motion abnormalities. There is mild left ventricular hypertrophy. Left ventricular diastolic parameters are consistent with Grade I diastolic dysfunction (impaired relaxation).  2. Right ventricular systolic function is low normal. The right ventricular size is mildly enlarged.  3. The mitral valve is normal in structure. Trivial mitral valve regurgitation.  4. Tricuspid valve regurgitation is moderate.  5. The aortic valve is normal in structure. Aortic valve regurgitation is mild. Mild aortic valve sclerosis is present, with no evidence of aortic valve stenosis.  6. The inferior vena cava is normal in size with greater than 50% respiratory variability, suggesting right atrial pressure of 3 mmHg. FINDINGS  Left Ventricle: Left ventricular ejection fraction, by estimation, is 50 to 55%. The left ventricle has low normal function. The left ventricle has no regional wall motion abnormalities. Definity contrast agent was given  IV to delineate the left ventricular endocardial borders. The left ventricular internal cavity size was normal in size. There is mild left ventricular hypertrophy. Left ventricular diastolic parameters are consistent with Grade I diastolic dysfunction (impaired relaxation). Right Ventricle: The right ventricular size is mildly enlarged. Right vetricular wall thickness was not well visualized. Right ventricular systolic function is low normal. Left Atrium: Left atrial size was normal in size. Right Atrium: Right atrial size was not well visualized. Pericardium: There is no evidence of pericardial effusion. Mitral Valve: The mitral valve is normal in structure. Mild mitral annular calcification. Trivial mitral valve regurgitation. MV peak gradient, 1.9 mmHg. The mean mitral valve gradient is 1.0 mmHg. Tricuspid Valve: The  tricuspid valve is normal in structure. Tricuspid valve regurgitation is moderate. Aortic Valve: The aortic valve is normal in structure. Aortic valve regurgitation is mild. Mild aortic valve sclerosis is present, with no evidence of aortic valve stenosis. Aortic valve mean gradient measures 4.0 mmHg. Aortic valve peak gradient measures 7.0 mmHg. Aortic valve area, by VTI measures 2.05 cm. Pulmonic Valve: The pulmonic valve was not well visualized. Pulmonic valve regurgitation is not visualized. No evidence of pulmonic stenosis. Aorta: The aortic root is normal in size and structure. Venous: The inferior vena cava is normal in size with greater than 50% respiratory variability, suggesting right atrial pressure of 3 mmHg. IAS/Shunts: The interatrial septum was not well visualized.  LEFT VENTRICLE PLAX 2D LVIDd:         1.06 cm     Diastology LVIDs:         0.90 cm     LV e' medial:    4.90 cm/s LV PW:         3.87 cm     LV E/e' medial:  13.6 LVOT diam:     2.00 cm     LV e' lateral:   8.49 cm/s LV SV:         53          LV E/e' lateral: 7.8 LV SV Index:   25 LVOT Area:     3.14 cm  LV Volumes  (MOD) LV vol d, MOD A2C: 66.9 ml LV vol d, MOD A4C: 82.8 ml LV vol s, MOD A2C: 39.1 ml LV vol s, MOD A4C: 44.6 ml LV SV MOD A2C:     27.8 ml LV SV MOD A4C:     82.8 ml LV SV MOD BP:      33.3 ml LEFT ATRIUM             Index LA Vol (A2C):   38.3 ml 17.81 ml/m LA Vol (A4C):   37.6 ml 17.49 ml/m LA Biplane Vol: 39.4 ml 18.32 ml/m  AORTIC VALVE                   PULMONIC VALVE AV Area (Vmax):    1.65 cm    PV Vmax:       0.93 m/s AV Area (Vmean):   1.73 cm    PV Vmean:      61.900 cm/s AV Area (VTI):     2.05 cm    PV VTI:        0.189 m AV Vmax:           132.00 cm/s PV Peak grad:  3.5 mmHg AV Vmean:          90.100 cm/s PV Mean grad:  2.0 mmHg AV VTI:            0.260 m AV Peak Grad:      7.0 mmHg AV Mean Grad:      4.0 mmHg LVOT Vmax:         69.20 cm/s LVOT Vmean:        49.700 cm/s LVOT VTI:          0.170 m LVOT/AV VTI ratio: 0.65  AORTA Ao Root diam: 3.30 cm MITRAL VALVE               TRICUSPID VALVE MV Area (PHT): 3.03 cm    TR Peak grad:   28.1 mmHg MV Area VTI:   2.13 cm    TR Vmax:  265.00 cm/s MV Peak grad:  1.9 mmHg MV Mean grad:  1.0 mmHg    SHUNTS MV Vmax:       0.69 m/s    Systemic VTI:  0.17 m MV Vmean:      40.7 cm/s   Systemic Diam: 2.00 cm MV Decel Time: 250 msec MV E velocity: 66.40 cm/s MV A velocity: 52.30 cm/s MV E/A ratio:  1.27 Donnelly Angelica Electronically signed by Donnelly Angelica Signature Date/Time: 06/21/2021/12:37:12 PM    Final       Labs: BNP (last 3 results) Recent Labs    01/19/21 1321  BNP 409.7*   Basic Metabolic Panel: Recent Labs  Lab 06/27/21 0502 06/28/21 0425  NA 140 137  K 4.3 4.3  CL 106 105  CO2 30 26  GLUCOSE 84 83  BUN 16 12  CREATININE 0.65 0.58  CALCIUM 8.3* 8.5*   Liver Function Tests: No results for input(s): AST, ALT, ALKPHOS, BILITOT, PROT, ALBUMIN in the last 168 hours. No results for input(s): LIPASE, AMYLASE in the last 168 hours. No results for input(s): AMMONIA in the last 168 hours. CBC: Recent Labs  Lab 06/26/21 0433  06/28/21 0425  WBC 5.8 5.6  HGB 13.1 12.1  HCT 39.6 36.2  MCV 93.6 94.5  PLT 218 215   Cardiac Enzymes: No results for input(s): CKTOTAL, CKMB, CKMBINDEX, TROPONINI in the last 168 hours. BNP: Invalid input(s): POCBNP CBG: No results for input(s): GLUCAP in the last 168 hours. D-Dimer No results for input(s): DDIMER in the last 72 hours. Hgb A1c No results for input(s): HGBA1C in the last 72 hours. Lipid Profile No results for input(s): CHOL, HDL, LDLCALC, TRIG, CHOLHDL, LDLDIRECT in the last 72 hours. Thyroid function studies No results for input(s): TSH, T4TOTAL, T3FREE, THYROIDAB in the last 72 hours.  Invalid input(s): FREET3 Anemia work up No results for input(s): VITAMINB12, FOLATE, FERRITIN, TIBC, IRON, RETICCTPCT in the last 72 hours. Urinalysis    Component Value Date/Time   COLORURINE YELLOW (A) 06/19/2021 1430   APPEARANCEUR HAZY (A) 06/19/2021 1430   APPEARANCEUR Hazy 05/27/2013 1107   LABSPEC 1.038 (H) 06/19/2021 1430   LABSPEC 1.011 05/27/2013 1107   PHURINE 9.0 (H) 06/19/2021 1430   GLUCOSEU NEGATIVE 06/19/2021 1430   GLUCOSEU Negative 05/27/2013 1107   HGBUR NEGATIVE 06/19/2021 1430   BILIRUBINUR NEGATIVE 06/19/2021 1430   BILIRUBINUR Negative 05/27/2013 1107   KETONESUR NEGATIVE 06/19/2021 1430   PROTEINUR 30 (A) 06/19/2021 1430   NITRITE NEGATIVE 06/19/2021 1430   LEUKOCYTESUR LARGE (A) 06/19/2021 1430   LEUKOCYTESUR 1+ 05/27/2013 1107   Sepsis Labs Invalid input(s): PROCALCITONIN,  WBC,  LACTICIDVEN Microbiology No results found for this or any previous visit (from the past 240 hour(s)).   Total time spend on discharging this patient, including the last patient exam, discussing the hospital stay, instructions for ongoing care as it relates to all pertinent caregivers, as well as preparing the medical discharge records, prescriptions, and/or referrals as applicable, is 30 minutes.    Enzo Bi, MD  [facility] 07/02/2021, 10:04  AM

## 2021-07-20 ENCOUNTER — Ambulatory Visit (INDEPENDENT_AMBULATORY_CARE_PROVIDER_SITE_OTHER): Payer: Medicare HMO | Admitting: Nurse Practitioner

## 2021-08-10 ENCOUNTER — Other Ambulatory Visit: Payer: Self-pay

## 2021-08-10 ENCOUNTER — Encounter (INDEPENDENT_AMBULATORY_CARE_PROVIDER_SITE_OTHER): Payer: Self-pay | Admitting: Vascular Surgery

## 2021-08-10 ENCOUNTER — Ambulatory Visit (INDEPENDENT_AMBULATORY_CARE_PROVIDER_SITE_OTHER): Payer: Medicare HMO | Admitting: Vascular Surgery

## 2021-08-10 VITALS — BP 127/87 | HR 90 | Resp 16

## 2021-08-10 DIAGNOSIS — M7989 Other specified soft tissue disorders: Secondary | ICD-10-CM

## 2021-08-10 DIAGNOSIS — I890 Lymphedema, not elsewhere classified: Secondary | ICD-10-CM

## 2021-08-10 DIAGNOSIS — I10 Essential (primary) hypertension: Secondary | ICD-10-CM

## 2021-08-10 DIAGNOSIS — I480 Paroxysmal atrial fibrillation: Secondary | ICD-10-CM | POA: Diagnosis not present

## 2021-08-10 NOTE — Assessment & Plan Note (Signed)
Severe, per tickly on the left.  3 layer Unna boot placed today on the left leg and will be changed weekly.

## 2021-08-10 NOTE — Assessment & Plan Note (Signed)
Poor cardiac function can worsen lower extremity swelling

## 2021-08-10 NOTE — Assessment & Plan Note (Signed)
blood pressure control important in reducing the progression of atherosclerotic disease. On appropriate oral medications.  

## 2021-08-10 NOTE — Assessment & Plan Note (Signed)
Her lymphedema is quite severe.  She is using her lymphedema pump but it only goes to her thigh.  I think getting 1 that would go to her abdomen to help tremendously as she has a large amount of swelling up in her thigh and groin area that is not being controlled currently.  Her left leg swelling with recent cellulitis is going to be wrapped in a 3 layer Unna boot today.  This will be changed weekly.  We can reassess this in 3 to 4 weeks.  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

## 2021-08-10 NOTE — Progress Notes (Signed)
MRN : [id number]  Suzanne Dixon is a 70 y.o. ([birth year]) female who presents with chief complaint of  Chief Complaint  Patient presents with   Follow-up    Ref [place] ble infections  .  History of Present Illness: Patient returns today in follow up prior to her scheduled visit due to leg swelling.  This is predominantly in the left leg.  She had a recent episode of cellulitis.  She was on a course of oral antibiotics which improve the weeping and redness.  The left leg remains very swollen.  There is an area of thinning skin on the lateral aspect just above the ankle but no current open wounds.  The leg is sore and painful.  The right leg has chronic swelling but this is about the same.  Her right leg needs a knee replacement, but that has been put on hold due to her recent infection.  Current Outpatient Medications  Medication Sig Dispense Refill   allopurinol (ZYLOPRIM) 300 MG tablet Take 300 mg by mouth daily.      benzonatate (TESSALON) 100 MG capsule Take 100 mg by mouth 3 (three) times daily as needed for cough.     Calcium Carb-Cholecalciferol 600-400 MG-UNIT TABS Take 1 tablet by mouth 2 (two) times daily with a meal.     carbidopa-levodopa (SINEMET IR) 25-100 MG tablet Take 1.5 tablets by mouth 3 (three) times daily.     DULoxetine (CYMBALTA) 60 MG capsule Take 1 capsule by mouth 2 (two) times daily.     furosemide (LASIX) 40 MG tablet Hold until outpatient followup due to low blood pressure and patient stayed euvolemic. 30 tablet    gabapentin (NEURONTIN) 600 MG tablet Take 600 mg by mouth 4 (four) times daily.  11   Homeopathic Products (PSORIASIS/ECZEMA RELIEF EX) Apply 1 application topically daily as needed (eczema).     hydrocortisone 2.5 % cream Apply topically 2 (two) times daily as needed. Home med. 30 g 0   levothyroxine (SYNTHROID, LEVOTHROID) 50 MCG tablet Take 50 mcg by mouth daily before breakfast.     Magnesium 400 MG CAPS Take 400 mg by mouth daily.     meloxicam  (MOBIC) 15 MG tablet Take 1 tablet (15 mg total) by mouth daily as needed for pain.     metoprolol tartrate (LOPRESSOR) 100 MG tablet Take 1 tablet (100 mg total) by mouth 2 (two) times daily. Reduced from 200 mg.     Multiple Vitamins tablet Take 2 tablets by mouth daily.      omeprazole (PRILOSEC) 20 MG capsule Take 20 mg by mouth daily.      oxybutynin (DITROPAN) 5 MG tablet Take 5 mg by mouth 2 (two) times daily.     triamcinolone (KENALOG) 0.025 % cream Apply 1 application topically 2 (two) times daily as needed. Home med. 30 g 0   vitamin B-12 (CYANOCOBALAMIN) 1000 MCG tablet Take 1,000 mcg by mouth daily.     warfarin (COUMADIN) 6 MG tablet Take 1 tablet (6 mg total) by mouth daily at 4 PM. Reduced from 7 mg.     No current facility-administered medications for this visit.    Past Medical History:  Diagnosis Date   A-fib ([place])    Anemia    B12 deficiency    Breast cancer ([place]) 03/31/2013   Right - chemo- mastectomy   Breast cancer ([place]) 1991   Rt.- radiation   Complication of anesthesia 01/2009   Recent years with general anesthesia  had itching following surgery   DDD (degenerative disc disease), cervical    Depression    Diverticulitis    Dyspnea    Edema of both legs    GERD (gastroesophageal reflux disease)    Gout    H/O mastectomy 2014   per patient   H/O: cesarean section 1987   per patient report   H/O: hysterectomy 1996   per patient   Hydradenitis    Hypertension    Hypothyroidism    Neuropathy    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[place])    Parkinson's disease ([place])    Personal history of chemotherapy    Personal history of radiation therapy    Ruptured cervical disc 2002   per patient    Sleep apnea    cpap machine   Super obese    Tachycardia     Past Surgical History:  Procedure Laterality Date   ABDOMINAL HYSTERECTOMY     BACK SURGERY  2002   plate and 2 screws in neck   BREAST BIOPSY Right 1991,2014   Positive   BREAST CYST ASPIRATION Left     BREAST SURGERY Right 2014   mastectomy   CATARACT EXTRACTION W/PHACO Left 05/17/2019   Procedure: CATARACT EXTRACTION PHACO AND INTRAOCULAR LENS PLACEMENT ([place]);  Surgeon: Eulogio Bear, MD;  Location: [facility] ORS;  Service: Ophthalmology;  Laterality: Left;  [place] 00:28 CDE 2.15 FLUID PACK LOT # [id number]   CATARACT EXTRACTION W/PHACO Right 06/14/2019   Procedure: CATARACT EXTRACTION PHACO AND INTRAOCULAR LENS PLACEMENT ([facility]) RIGHT;  Surgeon: Eulogio Bear, MD;  Location: [facility] OR[facility];  Service: Ophthalmology;  Laterality: Right;  [place] 00:35.0 CDE 2.37 Fluid Pack Lot #[id number]   CESAREAN SECTION     CHOLECYSTECTOMY  04/09/2012   COLONOSCOPY WITH PROPOFOL N/A 06/22/2015   Procedure: COLONOSCOPY WITH PROPOFOL;  Surgeon: Manya Silvas, MD;  Location: [facility] ENDOSCOPY;  Service: Endoscopy;  Laterality: N/A;   COLONOSCOPY WITH PROPOFOL N/A 05/25/2021   Procedure: COLONOSCOPY WITH PROPOFOL;  Surgeon: Lesly Rubenstein, MD;  Location: [facility] ENDOSCOPY;  Service: Endoscopy;  Laterality: N/A;   ESOPHAGOGASTRODUODENOSCOPY     ESOPHAGOGASTRODUODENOSCOPY N/A 05/25/2021   Procedure: ESOPHAGOGASTRODUODENOSCOPY (EGD);  Surgeon: Lesly Rubenstein, MD;  Location: [facility] ENDOSCOPY;  Service: Endoscopy;  Laterality: N/A;   EYE SURGERY     LAPAROSCOPIC GASTRIC BYPASS     MASTECTOMY Right    MASTECTOMY MODIFIED RADICAL     mastectomy partial      lumpectomy   NEUROPLASTY / TRANSPOSITION ULNAR NERVE AT ELBOW     OOPHORECTOMY     PORT-A-CATH REMOVAL Left 05/13/2015   Procedure: REMOVAL PORT-A-CATH LEFT CHEST ;  Surgeon: Florene Glen, MD;  Location: [facility] ORS;  Service: General;  Laterality: Left;   REDUCTION MAMMAPLASTY Left    REVERSE SHOULDER ARTHROPLASTY Right 12/03/2020   Procedure: REVERSE SHOULDER ARTHROPLASTY;  Surgeon: Corky Mull, MD;  Location: [facility] ORS;  Service: Orthopedics;  Laterality: Right;   ROUX-EN-Y GASTRIC BYPASS  01/26/2009   SHOULDER ARTHROSCOPY WITH OPEN ROTATOR CUFF REPAIR Right  01/03/2017   Procedure: SHOULDER ARTHROSCOPY WITH OPEN ROTATOR CUFF REPAIR;  Surgeon: Corky Mull, MD;  Location: [facility] ORS;  Service: Orthopedics;  Laterality: Right;   SHOULDER ARTHROSCOPY WITH SUBACROMIAL DECOMPRESSION, ROTATOR CUFF REPAIR AND BICEP TENDON REPAIR Right 01/03/2017   Procedure: SHOULDER ARTHROSCOPY WITH SUBACROMIAL DECOMPRESSION, ROTATOR CUFF REPAIR AND BICEP TENDON REPAIR;  Surgeon: Corky Mull, MD;  Location: [facility] ORS;  Service: Orthopedics;  Laterality: Right;  Limited debridement  SPINE SURGERY       Social History   Tobacco Use   Smoking status: Never   Smokeless tobacco: Never  Substance Use Topics   Alcohol use: No    Alcohol/week: 0.0 standard drinks   Drug use: No       Family History  Problem Relation Age of Onset   Hypertension Mother    Lymphoma Mother    Cancer Sister        Breast   Breast cancer Sister 52     Allergies  Allergen Reactions   Advair Diskus [Fluticasone-Salmeterol] Other (See Comments)    Joint pain    Alendronate     Muscle pain   Singulair [Montelukast] Other (See Comments)    "muscle pain"   Sulfa Antibiotics Hives   Venlafaxine Other (See Comments)    "altered mental status/tremors"     REVIEW OF SYSTEMS (Negative unless checked)  Constitutional: [] Weight loss  [] Fever  [] Chills Cardiac: [] Chest pain   [] Chest pressure   [] Palpitations   [] Shortness of breath when laying flat   [] Shortness of breath at rest   [x] Shortness of breath with exertion. Vascular:  [] Pain in legs with walking   [] Pain in legs at rest   [] Pain in legs when laying flat   [] Claudication   [] Pain in feet when walking  [] Pain in feet at rest  [] Pain in feet when laying flat   [] History of DVT   [] Phlebitis   [x] Swelling in legs   [] Varicose veins   [] Non-healing ulcers Pulmonary:   [] Uses home oxygen   [] Productive cough   [] Hemoptysis   [] Wheeze  [] COPD   [] Asthma Neurologic:  [] Dizziness  [] Blackouts   [] Seizures   [] History of stroke    [] History of TIA  [] Aphasia   [] Temporary blindness   [] Dysphagia   [] Weakness or numbness in arms   [] Weakness or numbness in legs Musculoskeletal:  [x] Arthritis   [] Joint swelling   [x] Joint pain   [] Low back pain Hematologic:  [] Easy bruising  [] Easy bleeding   [] Hypercoagulable state   [] Anemic   Gastrointestinal:  [] Blood in stool   [] Vomiting blood  [] Gastroesophageal reflux/heartburn   [] Abdominal pain Genitourinary:  [] Chronic kidney disease   [] Difficult urination  [] Frequent urination  [] Burning with urination   [] Hematuria Skin:  [] Rashes   [] Ulcers   [] Wounds Psychological:  [] History of anxiety   []  History of major depression.  Physical Examination  BP 127/87 (BP Location: Left Arm)   Pulse 90   Resp 16  Gen:  WD/WN, NAD Head: [place]/AT, No temporalis wasting. Ear/Nose/Throat: Hearing grossly intact, nares w/o erythema or drainage Eyes: Conjunctiva clear. Sclera non-icteric Neck: Supple.  Trachea midline Pulmonary:  Good air movement, no use of accessory muscles.  Cardiac: Irregular Vascular:  Vessel Right Left  Radial Palpable Palpable       Musculoskeletal: M/S 5/5 throughout.  No deformity or atrophy.  2+ right lower extremity edema, 3-4+ left lower extremity edema.  She also has large fluid-filled swelling up in her thigh for worse on the left than the right this goes up to the groin.  She is in a wheelchair. Neurologic: Sensation grossly intact in extremities.  Symmetrical.  Speech is fluent.  Psychiatric: Judgment intact, Mood & affect appropriate for pt's clinical situation. Dermatologic: No rashes or ulcers noted.  No cellulitis or open wounds.      Labs Recent Results (from the past 2160 hour(s))  Surgical pathology     Status: None  Collection Time: 05/25/21 10:30 AM  Result Value Ref Range   SURGICAL PATHOLOGY      SURGICAL PATHOLOGY CASE: [id number] PATIENT: Jovita Marsico Surgical Pathology Report     Specimen Submitted: A. Esophagus, distal;  cbx B. Esophagus, proximal.; cbx  Clinical History: Dysphagia, rectal bleeding. gastric bypass, hemorrhoids, diverticulosis.    DIAGNOSIS: A. ESOPHAGUS, DISTAL; COLD BIOPSY: - SQUAMOUS MUCOSA WITH FEATURES OF MILD REFLUX ESOPHAGITIS. - NO INCREASE IN INTRAEPITHELIAL EOSINOPHILS (LESS THAN 2 PER HPF). - NEGATIVE FOR DYSPLASIA AND MALIGNANCY.  B. ESOPHAGUS, PROXIMAL; COLD BIOPSY: - SQUAMOUS MUCOSA WITH FEATURES OF MILD REFLUX ESOPHAGITIS AND SUPERFICIAL ACTIVE INFLAMMATION. - NO INCREASE IN INTRAEPITHELIAL EOSINOPHILS (LESS THAN 2 PER HPF). - NEGATIVE FOR DYSPLASIA AND MALIGNANCY.  Comment: Given the presence of superficial active inflammation, a GMS special stain was performed. There are intact fungal yeast or hyphae associated with inflammation. There is no evidence of ulceration or viral cytopathic effect.  Clinical and e ndoscopic correlation is recommended.  GROSS DESCRIPTION: A. Labeled: cbx distal esophagus Received: Formalin Collection time: 10:30 AM on 05/25/2021 Placed into formalin time: 10:30 AM on 05/25/2021 Tissue fragment(s): 3 Size: Aggregate, 1.1 x 0.3 x 0.2 cm Description: Tan-pink translucent soft tissue fragments Entirely submitted in 1 cassette.  B. Labeled: cbx proximal esophagus Received: Formalin Collection time: 10:34 AM on 05/25/2021 Placed into formalin time: 10:34 AM on 05/25/2021 Tissue fragment(s): 2 Size: Range from 0.4-0.5 cm Description: White-pink translucent soft tissue fragments Entirely submitted in 1 cassette.  RB 05/25/2021  Final Diagnosis performed by Allena Napoleon, MD.   Electronically signed 05/27/2021 2:44:06PM The electronic signature indicates that the named Attending Pathologist has evaluated the specimen Technical component performed at [facility], [address], [place], [place] [address] Lab: [phone] Dir: Rush Farmer, MD, MMM  Professional  component performed at [facility], [facility], [place],  [place], [place] [address] Lab: [phone] Dir: Dellia Nims. Rubinas, MD   CBC with Differential     Status: None   Collection Time: 06/19/21 11:45 AM  Result Value Ref Range   WBC 8.5 4.0 - 10.5 K/uL   RBC 4.50 3.87 - 5.11 MIL/uL   Hemoglobin 14.2 12.0 - 15.0 g/dL   HCT 41.1 36.0 - 46.0 %   MCV 91.3 80.0 - 100.0 fL   MCH 31.6 26.0 - 34.0 pg   MCHC 34.5 30.0 - 36.0 g/dL   RDW 14.0 11.5 - 15.5 %   Platelets 324 150 - 400 K/uL   nRBC 0.0 0.0 - 0.2 %   Neutrophils Relative % 49 %   Neutro Abs 4.2 1.7 - 7.7 K/uL   Lymphocytes Relative 41 %   Lymphs Abs 3.5 0.7 - 4.0 K/uL   Monocytes Relative 9 %   Monocytes Absolute 0.7 0.1 - 1.0 K/uL   Eosinophils Relative 0 %   Eosinophils Absolute 0.0 0.0 - 0.5 K/uL   Basophils Relative 1 %   Basophils Absolute 0.0 0.0 - 0.1 K/uL   Immature Granulocytes 0 %   Abs Immature Granulocytes 0.02 0.00 - 0.07 K/uL    Comment: Performed at [facility], [address] [place],  [address]  Comprehensive metabolic panel     Status: Abnormal   Collection Time: 06/19/21 11:45 AM  Result Value Ref Range   Sodium 141 135 - 145 mmol/L   Potassium 3.9 3.5 - 5.1 mmol/L   Chloride 101 98 - 111 mmol/L   CO2 26 22 - 32 mmol/L   Glucose, Bld 115 (H)  70 - 99 mg/dL    Comment: Glucose reference range applies only to samples taken after fasting for at least 8 hours.   BUN 11 8 - 23 mg/dL   Creatinine, Ser 0.98 0.44 - 1.00 mg/dL   Calcium 9.0 8.9 - 10.3 mg/dL   Total Protein 7.1 6.5 - 8.1 g/dL   Albumin 3.5 3.5 - 5.0 g/dL   AST 37 15 - 41 U/L   ALT 17 0 - 44 U/L   Alkaline Phosphatase 68 38 - 126 U/L   Total Bilirubin 1.2 0.3 - 1.2 mg/dL   GFR, Estimated >60 >60 mL/min    Comment: (NOTE) Calculated using the CKD-EPI Creatinine Equation (2021)    Anion gap 14 5 - 15    Comment: Performed at [facility], [address] [place], [place] [address]  Troponin I (High Sensitivity)     Status: Abnormal   Collection Time: 06/19/21 11:45 AM   Result Value Ref Range   Troponin I (High Sensitivity) 100 (HH) <18 ng/L    Comment: CRITICAL RESULT CALLED TO, READ BACK BY AND VERIFIED WITH SHURONIA PFLUEGER RN @1235  06/19/21 SCS (NOTE) Elevated high sensitivity troponin I (hsTnI) values and significant  changes across serial measurements may suggest ACS but many other  chronic and acute conditions are known to elevate hsTnI results.  Refer to the "Links" section for chest pain algorithms and additional  guidance. Performed at [facility], [address] [place], [place] [address]   Lactic acid, plasma     Status: Abnormal   Collection Time: 06/19/21 11:45 AM  Result Value Ref Range   Lactic Acid, Venous 3.2 (HH) 0.5 - 1.9 mmol/L    Comment: CRITICAL RESULT CALLED TO, READ BACK BY AND VERIFIED WITH SHURONIA PFLUEGER RN@1224  10/122 SCS Performed at [facility], [address] [place], [place] [address]   Blood culture (single)     Status: Abnormal   Collection Time: 06/19/21 11:45 AM   Specimen: BLOOD  Result Value Ref Range   Specimen Description      BLOOD BLOOD LEFT HAND Performed at [facility], [address], [place], [place] [address]    Special Requests      BOTTLES DRAWN AEROBIC AND ANAEROBIC Blood Culture adequate volume Performed at [facility], [address] [place], [place] [address]    Culture  Setup Time      GRAM POSITIVE RODS IN BOTH AEROBIC AND ANAEROBIC BOTTLES CRITICAL RESULT CALLED TO, READ BACK BY AND VERIFIED WITH: MORGAN HICKS AT 1927 06/20/21.PMF    Culture (A)     DIPHTHEROIDS(CORYNEBACTERIUM SPECIES) Standardized susceptibility testing for this organism is not available. Performed at [facility], [place] [address] [place], [place] [address]    Report Status 06/23/2021 FINAL   D-dimer, quantitative     Status: Abnormal   Collection Time: 06/19/21 11:45 AM  Result Value Ref Range   D-Dimer, Quant 1.02 (H) 0.00 - 0.50 ug/mL-FEU    Comment:  (NOTE) At the manufacturer cut-off value of 0.5 g/mL FEU, this assay has a negative predictive value of 95-100%.This assay is intended for use in conjunction with a clinical pretest probability (PTP) assessment model to exclude pulmonary embolism (PE) and deep venous thrombosis (DVT) in outpatients suspected of PE or DVT. Results should be correlated with clinical presentation. Performed at [facility], [address] [place], [place] [address]   Ferritin     Status: None   Collection Time: 06/19/21  11:45 AM  Result Value Ref Range   Ferritin 150 11 - 307 ng/mL    Comment: Performed at [facility], [address] [place], [place] [address]  Fibrinogen     Status: Abnormal   Collection Time: 06/19/21 11:45 AM  Result Value Ref Range   Fibrinogen 600 (H) 210 - 475 mg/dL    Comment: (NOTE) Fibrinogen results may be underestimated in patients receiving thrombolytic therapy. Performed at [facility], [address] [place],  [address]   Procalcitonin     Status: None   Collection Time: 06/19/21 11:45 AM  Result Value Ref Range   Procalcitonin <0.10 ng/mL    Comment:        Interpretation: PCT (Procalcitonin) <= 0.5 ng/mL: Systemic infection (sepsis) is not likely. Local bacterial infection is possible. (NOTE)       Sepsis PCT Algorithm           Lower Respiratory Tract                                      Infection PCT Algorithm    ----------------------------     ----------------------------         PCT < 0.25 ng/mL                PCT < 0.10 ng/mL          Strongly encourage             Strongly discourage   discontinuation of antibiotics    initiation of antibiotics    ----------------------------     -----------------------------       PCT 0.25 - 0.50 ng/mL            PCT 0.10 - 0.25 ng/mL               OR       >80% decrease in PCT            Discourage initiation of                                            antibiotics       Encourage discontinuation           of antibiotics    ----------------------------     -----------------------------         PCT >= 0.50 ng/mL              PCT 0.26 - 0.50 ng/mL               AND        <80% decrease in PCT             Encourage initiation of                                             antibiotics       Encourage continuation           of antibiotics    ----------------------------     -----------------------------        PCT >= 0.50 ng/mL  PCT > 0.50 ng/mL               AND         increase in PCT                  Strongly encourage                                      initiation of antibiotics    Strongly encourage escalation           of antibiotics                                     -----------------------------                                           PCT <= 0.25 ng/mL                                                 OR                                        > 80% decrease in PCT                                      Discontinue / Do not initiate                                             antibiotics  Performed at [facility], [address], [place], [place] [address]   Resp Panel by RT-PCR (Flu A&B, Covid) Nasopharyngeal Swab     Status: Abnormal   Collection Time: 06/19/21 12:00 PM   Specimen: Nasopharyngeal Swab; Nasopharyngeal(NP) swabs in vial transport medium  Result Value Ref Range   SARS Coronavirus 2 by RT PCR POSITIVE (A) NEGATIVE    Comment: RESULT CALLED TO, READ BACK BY AND VERIFIED WITH: C/RONNIE PFLUGER 06/19/21 1344 AMK (NOTE) SARS-CoV-2 target nucleic acids are DETECTED.  The SARS-CoV-2 RNA is generally detectable in upper respiratory specimens during the acute phase of infection. Positive results are indicative of the presence of the identified virus, but do not rule out bacterial infection or co-infection with other pathogens not detected by the test. Clinical correlation with patient history and other  diagnostic information is necessary to determine patient infection status. The expected result is Negative.  Fact Sheet for Patients: EntrepreneurPulse.com.au  Fact Sheet for Healthcare Providers: IncredibleEmployment.be  This test is not yet approved or cleared by the [place] FDA and  has been authorized for detection and/or diagnosis of SARS-CoV-2 by FDA under an Emergency Use Authorization (EUA).  This EUA will remain in effect (meaning this test can be  used) for the duration of  the COVID-19 declaration under Section 564(b)(1) of the Act,  21 U.S.C. section 360bbb-3(b)(1), unless the authorization is terminated or revoked sooner.     Influenza A by PCR NEGATIVE NEGATIVE   Influenza B by PCR NEGATIVE NEGATIVE    Comment: (NOTE) The Xpert Xpress SARS-CoV-2/FLU/RSV plus assay is intended as an aid in the diagnosis of influenza from Nasopharyngeal swab specimens and should not be used as a sole basis for treatment. Nasal washings and aspirates are unacceptable for Xpert Xpress SARS-CoV-2/FLU/RSV testing.  Fact Sheet for Patients: EntrepreneurPulse.com.au  Fact Sheet for Healthcare Providers: IncredibleEmployment.be  This test is not yet approved or cleared by the [place] FDA and has been authorized for detection and/or diagnosis of SARS-CoV-2 by FDA under an Emergency Use Authorization (EUA). This EUA will remain in effect (meaning this test can be used) for the duration of the COVID-19 declaration under Section 564(b)(1) of the Act, 21 U.S.C. section 360bbb-3(b)(1), unless the authorization is terminated or revoked.  Performed at [facility], [address] [place], [place] [address]   Blood culture (single)     Status: None   Collection Time: 06/19/21  1:18 PM   Specimen: BLOOD  Result Value Ref Range   Specimen Description BLOOD LEFT ANTECUBITAL    Special Requests       BOTTLES DRAWN AEROBIC AND ANAEROBIC Blood Culture adequate volume   Culture      NO GROWTH 5 DAYS Performed at [facility], [address] [place], [place] [address]    Report Status 06/24/2021 FINAL   Lactic acid, plasma     Status: Abnormal   Collection Time: 06/19/21  1:54 PM  Result Value Ref Range   Lactic Acid, Venous 2.0 (HH) 0.5 - 1.9 mmol/L    Comment: CRITICAL VALUE NOTED. VALUE IS CONSISTENT WITH PREVIOUSLY REPORTED/CALLED VALUE MJU Performed at [facility], [address] [place], [place] [address]   Protime-INR     Status: Abnormal   Collection Time: 06/19/21  1:54 PM  Result Value Ref Range   Prothrombin Time 17.2 (H) 11.4 - 15.2 seconds   INR 1.4 (H) 0.8 - 1.2    Comment: (NOTE) INR goal varies based on device and disease states. Performed at [facility], [place], [place] [address]   Troponin I (High Sensitivity)     Status: Abnormal   Collection Time: 06/19/21  1:54 PM  Result Value Ref Range   Troponin I (High Sensitivity) 93 (H) <18 ng/L    Comment: (NOTE) Elevated high sensitivity troponin I (hsTnI) values and significant  changes across serial measurements may suggest ACS but many other  chronic and acute conditions are known to elevate hsTnI results.  Refer to the "Links" section for chest pain algorithms and additional  guidance. Performed at [facility], [address] [place], [place] [address]   Urinalysis, Routine w reflex microscopic Urine, Clean Catch     Status: Abnormal   Collection Time: 06/19/21  2:30 PM  Result Value Ref Range   Color, Urine YELLOW (A) YELLOW   APPearance HAZY (A) CLEAR   Specific Gravity, Urine 1.038 (H) 1.005 - 1.030   pH 9.0 (H) 5.0 - 8.0   Glucose, UA NEGATIVE NEGATIVE mg/dL   Hgb urine dipstick NEGATIVE NEGATIVE   Bilirubin Urine NEGATIVE NEGATIVE   Ketones, ur NEGATIVE NEGATIVE mg/dL   Protein, ur 30 (A) NEGATIVE mg/dL   Nitrite NEGATIVE NEGATIVE    Leukocytes,Ua LARGE (A) NEGATIVE    Comment: Performed at [facility], [place]  [address] [place], [place] [address]  Urine Culture     Status: Abnormal   Collection Time: 06/19/21  2:30 PM   Specimen: Urine, Clean Catch  Result Value Ref Range   Specimen Description      URINE, CLEAN CATCH Performed at [facility], [address], [place], [place] [address]    Special Requests      NONE Performed at [facility], [address] [place], [place] [address]    Culture >=100,000 COLONIES/mL CITROBACTER KOSERI (A)    Report Status 06/22/2021 FINAL    Organism ID, Bacteria CITROBACTER KOSERI (A)       Susceptibility   Citrobacter koseri - MIC*    CEFAZOLIN <=4 SENSITIVE Sensitive     CEFEPIME <=0.12 SENSITIVE Sensitive     CEFTRIAXONE <=0.25 SENSITIVE Sensitive     CIPROFLOXACIN <=0.25 SENSITIVE Sensitive     GENTAMICIN <=1 SENSITIVE Sensitive     IMIPENEM <=0.25 SENSITIVE Sensitive     NITROFURANTOIN 32 SENSITIVE Sensitive     TRIMETH/SULFA <=20 SENSITIVE Sensitive     PIP/TAZO <=4 SENSITIVE Sensitive     * >=100,000 COLONIES/mL CITROBACTER KOSERI  Urinalysis, Microscopic (reflex)     Status: None   Collection Time: 06/19/21  2:30 PM  Result Value Ref Range   RBC / HPF 11-20 0 - 5 RBC/hpf   WBC, UA >50 0 - 5 WBC/hpf   Bacteria, UA NONE SEEN NONE SEEN   Squamous Epithelial / LPF 0-5 0 - 5    Comment: Performed at [facility], [address] [place], [place] [address]  Troponin I (High Sensitivity)     Status: Abnormal   Collection Time: 06/19/21  5:30 PM  Result Value Ref Range   Troponin I (High Sensitivity) 106 (HH) <18 ng/L    Comment: CRITICAL RESULT CALLED TO, READ BACK BY AND VERIFIED WITH Martinique NICKERSON RN @1829  06/19/21 SCS (NOTE) Elevated high sensitivity troponin I (hsTnI) values and significant  changes across serial measurements may suggest ACS but many other  chronic and acute conditions are known to elevate hsTnI  results.  Refer to the "Links" section for chest pain algorithms and additional  guidance. Performed at [facility], [address] [place], [place] [address]   Lactic acid, plasma     Status: None   Collection Time: 06/19/21  5:30 PM  Result Value Ref Range   Lactic Acid, Venous 1.3 0.5 - 1.9 mmol/L    Comment: Performed at [facility], [place], [place] [address]  Troponin I (High Sensitivity)     Status: Abnormal   Collection Time: 06/19/21  7:43 PM  Result Value Ref Range   Troponin I (High Sensitivity) 119 (HH) <18 ng/L    Comment: CRITICAL VALUE NOTED. VALUE IS CONSISTENT WITH PREVIOUSLY REPORTED/CALLED VALUE DLB (NOTE) Elevated high sensitivity troponin I (hsTnI) values and significant  changes across serial measurements may suggest ACS but many other  chronic and acute conditions are known to elevate hsTnI results.  Refer to the "Links" section for chest pain algorithms and additional  guidance. Performed at [facility], [address] [place], [place] [address]   C-reactive protein     Status: None   Collection Time: 06/19/21  7:43 PM  Result Value Ref Range   CRP 0.6 <1.0 mg/dL    Comment: Performed at [facility], [place] [address], [place],  [address]  CBC with Differential/Platelet     Status: None  Collection Time: 06/20/21  4:46 AM  Result Value Ref Range   WBC 7.3 4.0 - 10.5 K/uL   RBC 3.98 3.87 - 5.11 MIL/uL   Hemoglobin 12.4 12.0 - 15.0 g/dL   HCT 36.7 36.0 - 46.0 %   MCV 92.2 80.0 - 100.0 fL   MCH 31.2 26.0 - 34.0 pg   MCHC 33.8 30.0 - 36.0 g/dL   RDW 14.4 11.5 - 15.5 %   Platelets 277 150 - 400 K/uL   nRBC 0.0 0.0 - 0.2 %   Neutrophils Relative % 42 %   Neutro Abs 3.1 1.7 - 7.7 K/uL   Lymphocytes Relative 43 %   Lymphs Abs 3.2 0.7 - 4.0 K/uL   Monocytes Relative 13 %   Monocytes Absolute 0.9 0.1 - 1.0 K/uL   Eosinophils Relative 1 %   Eosinophils Absolute 0.0 0.0 - 0.5 K/uL   Basophils  Relative 1 %   Basophils Absolute 0.1 0.0 - 0.1 K/uL   Immature Granulocytes 0 %   Abs Immature Granulocytes 0.02 0.00 - 0.07 K/uL    Comment: Performed at [facility], [address] [place], [place] [address]  Comprehensive metabolic panel     Status: Abnormal   Collection Time: 06/20/21  4:46 AM  Result Value Ref Range   Sodium 140 135 - 145 mmol/L   Potassium 3.5 3.5 - 5.1 mmol/L   Chloride 106 98 - 111 mmol/L   CO2 28 22 - 32 mmol/L   Glucose, Bld 93 70 - 99 mg/dL    Comment: Glucose reference range applies only to samples taken after fasting for at least 8 hours.   BUN 9 8 - 23 mg/dL   Creatinine, Ser 0.75 0.44 - 1.00 mg/dL   Calcium 8.3 (L) 8.9 - 10.3 mg/dL   Total Protein 6.0 (L) 6.5 - 8.1 g/dL   Albumin 2.9 (L) 3.5 - 5.0 g/dL   AST 24 15 - 41 U/L   ALT <5 0 - 44 U/L   Alkaline Phosphatase 60 38 - 126 U/L   Total Bilirubin 0.9 0.3 - 1.2 mg/dL   GFR, Estimated >60 >60 mL/min    Comment: (NOTE) Calculated using the CKD-EPI Creatinine Equation (2021)    Anion gap 6 5 - 15    Comment: Performed at [facility], [address] [place], [place] [address]  C-reactive protein     Status: None   Collection Time: 06/20/21  4:46 AM  Result Value Ref Range   CRP 0.6 <1.0 mg/dL    Comment: Performed at [facility] [address], [place], [place] [address]  D-dimer, quantitative     Status: Abnormal   Collection Time: 06/20/21  4:46 AM  Result Value Ref Range   D-Dimer, Quant 0.90 (H) 0.00 - 0.50 ug/mL-FEU    Comment: (NOTE) At the manufacturer cut-off value of 0.5 g/mL FEU, this assay has a negative predictive value of 95-100%.This assay is intended for use in conjunction with a clinical pretest probability (PTP) assessment model to exclude pulmonary embolism (PE) and deep venous thrombosis (DVT) in outpatients suspected of PE or DVT. Results should be correlated with clinical presentation. Performed at [facility], [address] [place], [place] [address]   Ferritin     Status: None   Collection Time: 06/20/21  4:46 AM  Result Value Ref Range   Ferritin 115 11 - 307 ng/mL    Comment: Performed at [facility], [address] [place], [place] [address]  Magnesium     Status: Abnormal   Collection Time: 06/20/21  4:46 AM  Result Value Ref Range   Magnesium 1.6 (L) 1.7 - 2.4 mg/dL    Comment: Performed at [facility], [address] [place], [place] [address]  Phosphorus     Status: None   Collection Time: 06/20/21  4:46 AM  Result Value Ref Range   Phosphorus 3.9 2.5 - 4.6 mg/dL    Comment: Performed at [facility], [address] [place], [place] [address]  Protime-INR     Status: Abnormal   Collection Time: 06/20/21  4:46 AM  Result Value Ref Range   Prothrombin Time 16.7 (H) 11.4 - 15.2 seconds   INR 1.4 (H) 0.8 - 1.2    Comment: (NOTE) INR goal varies based on device and disease states. Performed at [facility], [address] [place], [place] [address]   CBC with Differential/Platelet     Status: None   Collection Time: 06/21/21  4:37 AM  Result Value Ref Range   WBC 6.1 4.0 - 10.5 K/uL   RBC 4.01 3.87 - 5.11 MIL/uL   Hemoglobin 12.5 12.0 - 15.0 g/dL   HCT 36.9 36.0 - 46.0 %   MCV 92.0 80.0 - 100.0 fL   MCH 31.2 26.0 - 34.0 pg   MCHC 33.9 30.0 - 36.0 g/dL   RDW 14.2 11.5 - 15.5 %   Platelets 259 150 - 400 K/uL   nRBC 0.0 0.0 - 0.2 %   Neutrophils Relative % 35 %   Neutro Abs 2.1 1.7 - 7.7 K/uL   Lymphocytes Relative 47 %   Lymphs Abs 2.9 0.7 - 4.0 K/uL   Monocytes Relative 16 %   Monocytes Absolute 1.0 0.1 - 1.0 K/uL   Eosinophils Relative 1 %   Eosinophils Absolute 0.1 0.0 - 0.5 K/uL   Basophils Relative 1 %   Basophils Absolute 0.1 0.0 - 0.1 K/uL   Immature Granulocytes 0 %   Abs Immature Granulocytes 0.02 0.00 - 0.07 K/uL    Comment: Performed at [facility], [address] [place], [place] [address]  Comprehensive metabolic  panel     Status: Abnormal   Collection Time: 06/21/21  4:37 AM  Result Value Ref Range   Sodium 138 135 - 145 mmol/L   Potassium 3.6 3.5 - 5.1 mmol/L   Chloride 105 98 - 111 mmol/L   CO2 28 22 - 32 mmol/L   Glucose, Bld 88 70 - 99 mg/dL    Comment: Glucose reference range applies only to samples taken after fasting for at least 8 hours.   BUN 13 8 - 23 mg/dL   Creatinine, Ser 0.65 0.44 - 1.00 mg/dL   Calcium 8.2 (L) 8.9 - 10.3 mg/dL   Total Protein 5.8 (L) 6.5 - 8.1 g/dL   Albumin 2.8 (L) 3.5 - 5.0 g/dL   AST 25 15 - 41 U/L   ALT <5 0 - 44 U/L   Alkaline Phosphatase 55 38 - 126 U/L   Total Bilirubin 0.6 0.3 - 1.2 mg/dL   GFR, Estimated >60 >60 mL/min    Comment: (NOTE) Calculated using the CKD-EPI Creatinine Equation (2021)    Anion gap 5 5 - 15    Comment: Performed at [facility], [address] [place],  [address]  C-reactive protein     Status: None   Collection Time: 06/21/21  4:37 AM  Result Value Ref  Range   CRP 0.6 <1.0 mg/dL    Comment: Performed at [facility] [address] [place] [address] [place], [place] [address]  D-dimer, quantitative     Status: Abnormal   Collection Time: 06/21/21  4:37 AM  Result Value Ref Range   D-Dimer, Quant 0.69 (H) 0.00 - 0.50 ug/mL-FEU    Comment: (NOTE) At the manufacturer cut-off value of 0.5 g/mL FEU, this assay has a negative predictive value of 95-100%.This assay is intended for use in conjunction with a clinical pretest probability (PTP) assessment model to exclude pulmonary embolism (PE) and deep venous thrombosis (DVT) in outpatients suspected of PE or DVT. Results should be correlated with clinical presentation. Performed at [facility], [address] [place], [place] [address]   Ferritin     Status: None   Collection Time: 06/21/21  4:37 AM  Result Value Ref Range   Ferritin 115 11 - 307 ng/mL    Comment: Performed at [facility], [address] [place], [place] [address]   Magnesium     Status: None   Collection Time: 06/21/21  4:37 AM  Result Value Ref Range   Magnesium 1.8 1.7 - 2.4 mg/dL    Comment: Performed at [facility], [address] [place], [place] [address]  Phosphorus     Status: None   Collection Time: 06/21/21  4:37 AM  Result Value Ref Range   Phosphorus 3.7 2.5 - 4.6 mg/dL    Comment: Performed at [facility], [address] [place], [place] [address]  Protime-INR     Status: Abnormal   Collection Time: 06/21/21  4:37 AM  Result Value Ref Range   Prothrombin Time 19.6 (H) 11.4 - 15.2 seconds   INR 1.7 (H) 0.8 - 1.2    Comment: (NOTE) INR goal varies based on device and disease states. Performed at [facility], [address] [place], [place] [address]   ECHOCARDIOGRAM COMPLETE     Status: None   Collection Time: 06/21/21  9:08 AM  Result Value Ref Range   Weight 3,996.8 oz   Height 64 in   BP 112/66 mmHg   Ao pk vel 1.32 m/s   AV Area VTI 2.05 cm2   AR max vel 1.65 cm2   AV Mean grad 4.0 mmHg   AV Peak grad 7.0 mmHg   Single Plane A2C EF 41.6 %   Single Plane A4C EF 46.1 %   Calc EF 43.2 %   AV Area mean vel 1.73 cm2   Area-P 1/2 3.03 cm2   MV VTI 2.13 cm2   S' Lateral 0.90 cm  CBC with Differential/Platelet     Status: None   Collection Time: 06/22/21  4:29 AM  Result Value Ref Range   WBC 5.8 4.0 - 10.5 K/uL   RBC 3.97 3.87 - 5.11 MIL/uL   Hemoglobin 12.9 12.0 - 15.0 g/dL   HCT 37.1 36.0 - 46.0 %   MCV 93.5 80.0 - 100.0 fL   MCH 32.5 26.0 - 34.0 pg   MCHC 34.8 30.0 - 36.0 g/dL   RDW 14.0 11.5 - 15.5 %   Platelets 225 150 - 400 K/uL   nRBC 0.0 0.0 - 0.2 %   Neutrophils Relative % 32 %   Neutro Abs 1.9 1.7 - 7.7 K/uL   Lymphocytes Relative 50 %   Lymphs Abs 3.0 0.7 - 4.0 K/uL   Monocytes Relative 15 %   Monocytes Absolute 0.9 0.1 -  1.0 K/uL   Eosinophils Relative 2 %   Eosinophils Absolute 0.1 0.0 - 0.5 K/uL   Basophils Relative 1 %   Basophils Absolute 0.0 0.0 - 0.1 K/uL    Immature Granulocytes 0 %   Abs Immature Granulocytes 0.01 0.00 - 0.07 K/uL    Comment: Performed at [facility], [address] [place], [place] [address]  Comprehensive metabolic panel     Status: Abnormal   Collection Time: 06/22/21  4:29 AM  Result Value Ref Range   Sodium 138 135 - 145 mmol/L   Potassium 3.7 3.5 - 5.1 mmol/L   Chloride 106 98 - 111 mmol/L   CO2 27 22 - 32 mmol/L   Glucose, Bld 87 70 - 99 mg/dL    Comment: Glucose reference range applies only to samples taken after fasting for at least 8 hours.   BUN 15 8 - 23 mg/dL   Creatinine, Ser 0.69 0.44 - 1.00 mg/dL   Calcium 8.0 (L) 8.9 - 10.3 mg/dL   Total Protein 5.7 (L) 6.5 - 8.1 g/dL   Albumin 2.8 (L) 3.5 - 5.0 g/dL   AST 39 15 - 41 U/L   ALT <5 0 - 44 U/L   Alkaline Phosphatase 62 38 - 126 U/L   Total Bilirubin 0.5 0.3 - 1.2 mg/dL   GFR, Estimated >60 >60 mL/min    Comment: (NOTE) Calculated using the CKD-EPI Creatinine Equation (2021)    Anion gap 5 5 - 15    Comment: Performed at [facility], [address] [place], [place] [address]  C-reactive protein     Status: None   Collection Time: 06/22/21  4:29 AM  Result Value Ref Range   CRP 0.6 <1.0 mg/dL    Comment: Performed at [facility] [address], [place], [place] [address]  D-dimer, quantitative     Status: Abnormal   Collection Time: 06/22/21  4:29 AM  Result Value Ref Range   D-Dimer, Quant 0.53 (H) 0.00 - 0.50 ug/mL-FEU    Comment: (NOTE) At the manufacturer cut-off value of 0.5 g/mL FEU, this assay has a negative predictive value of 95-100%.This assay is intended for use in conjunction with a clinical pretest probability (PTP) assessment model to exclude pulmonary embolism (PE) and deep venous thrombosis (DVT) in outpatients suspected of PE or DVT. Results should be correlated with clinical presentation. Performed at [facility], [address] [place], [place] [address]   Ferritin     Status:  None   Collection Time: 06/22/21  4:29 AM  Result Value Ref Range   Ferritin 102 11 - 307 ng/mL    Comment: Performed at [facility], [address] [place], [place] [address]  Magnesium     Status: Abnormal   Collection Time: 06/22/21  4:29 AM  Result Value Ref Range   Magnesium 1.6 (L) 1.7 - 2.4 mg/dL    Comment: Performed at [facility], [address] [place], [place] [address]  Phosphorus     Status: None   Collection Time: 06/22/21  4:29 AM  Result Value Ref Range   Phosphorus 2.8 2.5 - 4.6 mg/dL    Comment: Performed at [facility], [address] [place], [place] [address]  Protime-INR     Status: Abnormal   Collection Time: 06/22/21  4:29 AM  Result Value Ref Range   Prothrombin Time 20.2 (H) 11.4 - 15.2 seconds   INR 1.7 (H) 0.8 - 1.2  Comment: (NOTE) INR goal varies based on device and disease states. Performed at [facility], [address] [place], [place] [address]   CBC with Differential/Platelet     Status: None   Collection Time: 06/23/21  4:54 AM  Result Value Ref Range   WBC 5.1 4.0 - 10.5 K/uL   RBC 4.26 3.87 - 5.11 MIL/uL   Hemoglobin 13.1 12.0 - 15.0 g/dL   HCT 39.1 36.0 - 46.0 %   MCV 91.8 80.0 - 100.0 fL   MCH 30.8 26.0 - 34.0 pg   MCHC 33.5 30.0 - 36.0 g/dL   RDW 13.9 11.5 - 15.5 %   Platelets 248 150 - 400 K/uL   nRBC 0.0 0.0 - 0.2 %   Neutrophils Relative % 33 %   Neutro Abs 1.7 1.7 - 7.7 K/uL   Lymphocytes Relative 47 %   Lymphs Abs 2.4 0.7 - 4.0 K/uL   Monocytes Relative 17 %   Monocytes Absolute 0.9 0.1 - 1.0 K/uL   Eosinophils Relative 2 %   Eosinophils Absolute 0.1 0.0 - 0.5 K/uL   Basophils Relative 1 %   Basophils Absolute 0.0 0.0 - 0.1 K/uL   Immature Granulocytes 0 %   Abs Immature Granulocytes 0.01 0.00 - 0.07 K/uL    Comment: Performed at [facility], [address] [place], [place] [address]  D-dimer, quantitative     Status: Abnormal   Collection Time: 06/23/21   4:54 AM  Result Value Ref Range   D-Dimer, Quant 0.52 (H) 0.00 - 0.50 ug/mL-FEU    Comment: (NOTE) At the manufacturer cut-off value of 0.5 g/mL FEU, this assay has a negative predictive value of 95-100%.This assay is intended for use in conjunction with a clinical pretest probability (PTP) assessment model to exclude pulmonary embolism (PE) and deep venous thrombosis (DVT) in outpatients suspected of PE or DVT. Results should be correlated with clinical presentation. Performed at [facility], [address] [place], [place] [address]   Ferritin     Status: None   Collection Time: 06/23/21  4:54 AM  Result Value Ref Range   Ferritin 118 11 - 307 ng/mL    Comment: Performed at [facility], [address] [place], [place] [address]  Phosphorus     Status: None   Collection Time: 06/23/21  4:54 AM  Result Value Ref Range   Phosphorus 3.1 2.5 - 4.6 mg/dL    Comment: Performed at [facility], [address] [place], [place] [address]  Protime-INR     Status: Abnormal   Collection Time: 06/23/21  4:54 AM  Result Value Ref Range   Prothrombin Time 22.5 (H) 11.4 - 15.2 seconds   INR 2.0 (H) 0.8 - 1.2    Comment: (NOTE) INR goal varies based on device and disease states. Performed at [facility], [address] [place], [place] [address]   Potassium     Status: None   Collection Time: 06/23/21  4:54 AM  Result Value Ref Range   Potassium 3.9 3.5 - 5.1 mmol/L    Comment: Performed at [facility], [address] [place], [place] [address]  Magnesium     Status: None   Collection Time: 06/23/21  4:54 AM  Result Value Ref Range   Magnesium 1.8 1.7 - 2.4 mg/dL    Comment: Performed at [facility], [address] [place], [place] [address]  C-reactive protein     Status: None  Collection Time: 06/23/21  7:24 AM  Result Value Ref Range   CRP 0.7 <1.0 mg/dL    Comment: Performed at [facility], [place]  [address], [place], [place] [address]  Protime-INR     Status: Abnormal   Collection Time: 06/24/21  5:32 AM  Result Value Ref Range   Prothrombin Time 27.0 (H) 11.4 - 15.2 seconds   INR 2.5 (H) 0.8 - 1.2    Comment: (NOTE) INR goal varies based on device and disease states. Performed at [facility], [address] [place], [place] [address]   Protime-INR     Status: Abnormal   Collection Time: 06/25/21  6:36 AM  Result Value Ref Range   Prothrombin Time 31.6 (H) 11.4 - 15.2 seconds   INR 3.1 (H) 0.8 - 1.2    Comment: (NOTE) INR goal varies based on device and disease states. Performed at [facility], [address] [place], [place] [address]   Basic metabolic panel     Status: Abnormal   Collection Time: 06/25/21  6:36 AM  Result Value Ref Range   Sodium 138 135 - 145 mmol/L   Potassium 4.5 3.5 - 5.1 mmol/L   Chloride 101 98 - 111 mmol/L   CO2 30 22 - 32 mmol/L   Glucose, Bld 93 70 - 99 mg/dL    Comment: Glucose reference range applies only to samples taken after fasting for at least 8 hours.   BUN 16 8 - 23 mg/dL   Creatinine, Ser 0.74 0.44 - 1.00 mg/dL   Calcium 8.6 (L) 8.9 - 10.3 mg/dL   GFR, Estimated >60 >60 mL/min    Comment: (NOTE) Calculated using the CKD-EPI Creatinine Equation (2021)    Anion gap 7 5 - 15    Comment: Performed at [facility], [address] [place], [place] [address]  Protime-INR     Status: Abnormal   Collection Time: 06/26/21  4:33 AM  Result Value Ref Range   Prothrombin Time 33.6 (H) 11.4 - 15.2 seconds   INR 3.3 (H) 0.8 - 1.2    Comment: (NOTE) INR goal varies based on device and disease states. Performed at [facility], [address] [place], [place] [address]   CBC     Status: None   Collection Time: 06/26/21  4:33 AM  Result Value Ref Range   WBC 5.8 4.0 - 10.5 K/uL   RBC 4.23 3.87 - 5.11 MIL/uL   Hemoglobin 13.1 12.0 - 15.0 g/dL   HCT 39.6 36.0 - 46.0 %   MCV 93.6 80.0 - 100.0 fL    MCH 31.0 26.0 - 34.0 pg   MCHC 33.1 30.0 - 36.0 g/dL   RDW 14.4 11.5 - 15.5 %   Platelets 218 150 - 400 K/uL   nRBC 0.0 0.0 - 0.2 %    Comment: Performed at [facility], [address] [place], [place] [address]  Protime-INR     Status: Abnormal   Collection Time: 06/27/21  5:02 AM  Result Value Ref Range   Prothrombin Time 30.8 (H) 11.4 - 15.2 seconds   INR 3.0 (H) 0.8 - 1.2    Comment: (NOTE) INR goal varies based on device and disease states. Performed at [facility], [address], [place], [place] [address]   Basic metabolic panel     Status: Abnormal   Collection Time: 06/27/21  5:02 AM  Result Value Ref Range   Sodium 140 135 - 145  mmol/L   Potassium 4.3 3.5 - 5.1 mmol/L   Chloride 106 98 - 111 mmol/L   CO2 30 22 - 32 mmol/L   Glucose, Bld 84 70 - 99 mg/dL    Comment: Glucose reference range applies only to samples taken after fasting for at least 8 hours.   BUN 16 8 - 23 mg/dL   Creatinine, Ser 0.65 0.44 - 1.00 mg/dL   Calcium 8.3 (L) 8.9 - 10.3 mg/dL   GFR, Estimated >60 >60 mL/min    Comment: (NOTE) Calculated using the CKD-EPI Creatinine Equation (2021)    Anion gap 4 (L) 5 - 15    Comment: Performed at [facility], [address] [place], [place] [address]  Protime-INR     Status: Abnormal   Collection Time: 06/28/21  4:25 AM  Result Value Ref Range   Prothrombin Time 27.9 (H) 11.4 - 15.2 seconds   INR 2.6 (H) 0.8 - 1.2    Comment: (NOTE) INR goal varies based on device and disease states. Performed at [facility], [address] [place], [place] [address]   CBC     Status: Abnormal   Collection Time: 06/28/21  4:25 AM  Result Value Ref Range   WBC 5.6 4.0 - 10.5 K/uL   RBC 3.83 (L) 3.87 - 5.11 MIL/uL   Hemoglobin 12.1 12.0 - 15.0 g/dL   HCT 36.2 36.0 - 46.0 %   MCV 94.5 80.0 - 100.0 fL   MCH 31.6 26.0 - 34.0 pg   MCHC 33.4 30.0 - 36.0 g/dL   RDW 14.4 11.5 - 15.5 %   Platelets 215 150 - 400 K/uL   nRBC  0.0 0.0 - 0.2 %    Comment: Performed at [facility], [address] [place], [place] [address]  Basic metabolic panel     Status: Abnormal   Collection Time: 06/28/21  4:25 AM  Result Value Ref Range   Sodium 137 135 - 145 mmol/L   Potassium 4.3 3.5 - 5.1 mmol/L   Chloride 105 98 - 111 mmol/L   CO2 26 22 - 32 mmol/L   Glucose, Bld 83 70 - 99 mg/dL    Comment: Glucose reference range applies only to samples taken after fasting for at least 8 hours.   BUN 12 8 - 23 mg/dL   Creatinine, Ser 0.58 0.44 - 1.00 mg/dL   Calcium 8.5 (L) 8.9 - 10.3 mg/dL   GFR, Estimated >60 >60 mL/min    Comment: (NOTE) Calculated using the CKD-EPI Creatinine Equation (2021)    Anion gap 6 5 - 15    Comment: Performed at [facility], [address] [place], [place] [address]  Protime-INR     Status: Abnormal   Collection Time: 06/29/21  5:35 AM  Result Value Ref Range   Prothrombin Time 30.1 (H) 11.4 - 15.2 seconds   INR 2.9 (H) 0.8 - 1.2    Comment: (NOTE) INR goal varies based on device and disease states. Performed at [facility], [address] [place], [place] [address]   Protime-INR     Status: Abnormal   Collection Time: 06/30/21  5:33 AM  Result Value Ref Range   Prothrombin Time 28.1 (H) 11.4 - 15.2 seconds   INR 2.6 (H) 0.8 - 1.2    Comment: (NOTE) INR goal varies based on device and disease states. Performed at [facility], [address], [place], [place] [address]   [place]  Status: Abnormal   Collection Time: 07/01/21  6:40 AM  Result Value Ref Range   Prothrombin Time 28.4 (H) 11.4 - 15.2 seconds   INR 2.7 (H) 0.8 - 1.2    Comment: (NOTE) INR goal varies based on device and disease states. Performed at [facility], [address] [place], [place] [address]   Protime-INR     Status: Abnormal   Collection Time: 07/02/21  5:04 AM  Result Value Ref Range   Prothrombin Time 32.2 (H) 11.4 - 15.2 seconds   INR 3.1  (H) 0.8 - 1.2    Comment: (NOTE) INR goal varies based on device and disease states. Performed at [facility], [address], [place], [place] [address]     Radiology No results found.  Assessment/Plan  Lymphedema Her lymphedema is quite severe.  She is using her lymphedema pump but it only goes to her thigh.  I think getting 1 that would go to her abdomen to help tremendously as she has a large amount of swelling up in her thigh and groin area that is not being controlled currently.  Her left leg swelling with recent cellulitis is going to be wrapped in a 3 layer Unna boot today.  This will be changed weekly.  We can reassess this in 3 to 4 weeks.  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  Swelling of limb Severe, per tickly on the left.  3 layer Unna boot placed today on the left leg and will be changed weekly.  AF (paroxysmal atrial fibrillation) (HCC) Poor cardiac function can worsen lower extremity swelling  Hypertension blood pressure control important in reducing the progression of atherosclerotic disease. On appropriate oral medications.    Leotis Pain, MD  08/10/2021 3:29 PM    This note was created with Dragon medical transcription system.  Any errors from dictation are purely unintentional

## 2021-08-11 ENCOUNTER — Telehealth (INDEPENDENT_AMBULATORY_CARE_PROVIDER_SITE_OTHER): Payer: Self-pay

## 2021-08-11 NOTE — Telephone Encounter (Signed)
Verbal orders were given to [facility] for weekly left unna wrap. Maggie from [facility] will contact our office to let us know if they can start requesting orders

## 2021-08-23 DIAGNOSIS — I700 Atherosclerosis of aorta: Secondary | ICD-10-CM | POA: Insufficient documentation

## 2021-08-24 DIAGNOSIS — M5134 Other intervertebral disc degeneration, thoracic region: Secondary | ICD-10-CM | POA: Insufficient documentation

## 2021-09-07 ENCOUNTER — Encounter (INDEPENDENT_AMBULATORY_CARE_PROVIDER_SITE_OTHER): Payer: Self-pay | Admitting: Nurse Practitioner

## 2021-09-07 ENCOUNTER — Ambulatory Visit (INDEPENDENT_AMBULATORY_CARE_PROVIDER_SITE_OTHER): Payer: Medicare HMO | Admitting: Nurse Practitioner

## 2021-09-07 ENCOUNTER — Other Ambulatory Visit: Payer: Self-pay

## 2021-09-07 VITALS — BP 110/73 | HR 82 | Resp 16

## 2021-09-07 DIAGNOSIS — I10 Essential (primary) hypertension: Secondary | ICD-10-CM

## 2021-09-07 DIAGNOSIS — L03116 Cellulitis of left lower limb: Secondary | ICD-10-CM

## 2021-09-07 DIAGNOSIS — I890 Lymphedema, not elsewhere classified: Secondary | ICD-10-CM | POA: Diagnosis not present

## 2021-09-14 NOTE — Progress Notes (Signed)
Subjective:    Patient ID: Suzanne Dixon, female    DOB: 1950/11/22, 70 y.o.   MRN: [id number] Chief Complaint  Patient presents with   Follow-up    4 week unna wrap follow up    Suzanne Dixon is a 70 year old female that returns today in relation to swelling in her left lower extremity.  She developed cellulitis but wound we initially saw her she still had weeping and redness with significant swelling.  Because of this her right knee replacement was delayed due to worry of possible infection.  Today the swelling is greatly under control.  The patient notes that the pain is under much better control.  She has tolerated the Unna wraps well.    Review of Systems  Cardiovascular:  Positive for leg swelling.  Musculoskeletal:  Positive for arthralgias, gait problem and joint swelling.  All other systems reviewed and are negative.     Objective:   Physical Exam Vitals reviewed.  HENT:     Head: Normocephalic.  Cardiovascular:     Rate and Rhythm: Normal rate.     Pulses: Normal pulses.  Pulmonary:     Effort: Pulmonary effort is normal.  Musculoskeletal:     Right lower leg: 1+ Edema present.     Left lower leg: 1+ Edema present.  Skin:    General: Skin is warm and dry.  Neurological:     Mental Status: She is alert and oriented to person, place, and time.  Psychiatric:        Mood and Affect: Mood normal.        Behavior: Behavior normal.        Thought Content: Thought content normal.        Judgment: Judgment normal.    BP 110/73 (BP Location: Left Arm)    Pulse 82    Resp 16   Past Medical History:  Diagnosis Date   A-fib ([place])    Anemia    B12 deficiency    Breast cancer ([place]) 03/31/2013   Right - chemo- mastectomy   Breast cancer ([place]) 1991   Rt.- radiation   Complication of anesthesia 01/2009   Recent years with general anesthesia had itching following surgery   DDD (degenerative disc disease), cervical    Depression    Diverticulitis    Dyspnea    Edema  of both legs    GERD (gastroesophageal reflux disease)    Gout    H/O mastectomy 2014   per patient   H/O: cesarean section 1987   per patient report   H/O: hysterectomy 1996   per patient   Hydradenitis    Hypertension    Hypothyroidism    Neuropathy    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[place])    Parkinson's disease ([place])    Personal history of chemotherapy    Personal history of radiation therapy    Ruptured cervical disc 2002   per patient    Sleep apnea    cpap machine   Super obese    Tachycardia     Social History   Socioeconomic History   Marital status: Widowed    Spouse name: Not on file   Number of children: Not on file   Years of education: Not on file   Highest education level: Not on file  Occupational History   Not on file  Tobacco Use   Smoking status: Never   Smokeless tobacco: Never  Vaping Use   Vaping Use: Not on file  Substance and Sexual Activity   Alcohol use: No    Alcohol/week: 0.0 standard drinks   Drug use: No   Sexual activity: Not on file  Other Topics Concern   Not on file  Social History Narrative   Not on file   Social Determinants of Health   Financial Resource Strain: Not on file  Food Insecurity: Not on file  Transportation Needs: Not on file  Physical Activity: Not on file  Stress: Not on file  Social Connections: Not on file  Intimate Partner Violence: Not on file    Past Surgical History:  Procedure Laterality Date   ABDOMINAL HYSTERECTOMY     BACK SURGERY  2002   plate and 2 screws in neck   BREAST BIOPSY Right 1991,2014   Positive   BREAST CYST ASPIRATION Left    BREAST SURGERY Right 2014   mastectomy   CATARACT EXTRACTION W/PHACO Left 05/17/2019   Procedure: CATARACT EXTRACTION PHACO AND INTRAOCULAR LENS PLACEMENT ([place]);  Surgeon: Eulogio Bear, MD;  Location: [facility] ORS;  Service: Ophthalmology;  Laterality: Left;  [place] 00:28 CDE 2.15 FLUID PACK LOT # [id number]   CATARACT EXTRACTION W/PHACO Right 06/14/2019    Procedure: CATARACT EXTRACTION PHACO AND INTRAOCULAR LENS PLACEMENT ([facility]) RIGHT;  Surgeon: Eulogio Bear, MD;  Location: [facility] ORS;  Service: Ophthalmology;  Laterality: Right;  [place] 00:35.0 CDE 2.37 Fluid Pack Lot #[id number]   CESAREAN SECTION     CHOLECYSTECTOMY  04/09/2012   COLONOSCOPY WITH PROPOFOL N/A 06/22/2015   Procedure: COLONOSCOPY WITH PROPOFOL;  Surgeon: Manya Silvas, MD;  Location: [facility] ENDOSCOPY;  Service: Endoscopy;  Laterality: N/A;   COLONOSCOPY WITH PROPOFOL N/A 05/25/2021   Procedure: COLONOSCOPY WITH PROPOFOL;  Surgeon: Lesly Rubenstein, MD;  Location: [facility] ENDOSCOPY;  Service: Endoscopy;  Laterality: N/A;   ESOPHAGOGASTRODUODENOSCOPY     ESOPHAGOGASTRODUODENOSCOPY N/A 05/25/2021   Procedure: ESOPHAGOGASTRODUODENOSCOPY (EGD);  Surgeon: Lesly Rubenstein, MD;  Location: [facility] ENDOSCOPY;  Service: Endoscopy;  Laterality: N/A;   EYE SURGERY     LAPAROSCOPIC GASTRIC BYPASS     MASTECTOMY Right    MASTECTOMY MODIFIED RADICAL     mastectomy partial      lumpectomy   NEUROPLASTY / TRANSPOSITION ULNAR NERVE AT ELBOW     OOPHORECTOMY     PORT-A-CATH REMOVAL Left 05/13/2015   Procedure: REMOVAL PORT-A-CATH LEFT CHEST ;  Surgeon: Florene Glen, MD;  Location: [facility] ORS;  Service: General;  Laterality: Left;   REDUCTION MAMMAPLASTY Left    REVERSE SHOULDER ARTHROPLASTY Right 12/03/2020   Procedure: REVERSE SHOULDER ARTHROPLASTY;  Surgeon: Corky Mull, MD;  Location: [facility] ORS;  Service: Orthopedics;  Laterality: Right;   ROUX-EN-Y GASTRIC BYPASS  01/26/2009   SHOULDER ARTHROSCOPY WITH OPEN ROTATOR CUFF REPAIR Right 01/03/2017   Procedure: SHOULDER ARTHROSCOPY WITH OPEN ROTATOR CUFF REPAIR;  Surgeon: Corky Mull, MD;  Location: [facility] ORS;  Service: Orthopedics;  Laterality: Right;   SHOULDER ARTHROSCOPY WITH SUBACROMIAL DECOMPRESSION, ROTATOR CUFF REPAIR AND BICEP TENDON REPAIR Right 01/03/2017   Procedure: SHOULDER ARTHROSCOPY WITH SUBACROMIAL DECOMPRESSION, ROTATOR CUFF  REPAIR AND BICEP TENDON REPAIR;  Surgeon: Corky Mull, MD;  Location: [facility] ORS;  Service: Orthopedics;  Laterality: Right;  Limited debridement   SPINE SURGERY      Family History  Problem Relation Age of Onset   Hypertension Mother    Lymphoma Mother    Cancer Sister        Breast   Breast cancer Sister 87    Allergies  Allergen Reactions   Advair Diskus [Fluticasone-Salmeterol] Other (See Comments)    Joint pain    Alendronate     Muscle pain   Singulair [Montelukast] Other (See Comments)    "muscle pain"   Sulfa Antibiotics Hives   Venlafaxine Other (See Comments)    "altered mental status/tremors"    CBC Latest Ref Rng & Units 06/28/2021 06/26/2021 06/23/2021  WBC 4.0 - 10.5 K/uL 5.6 5.8 5.1  Hemoglobin 12.0 - 15.0 g/dL 12.1 13.1 13.1  Hematocrit 36.0 - 46.0 % 36.2 39.6 39.1  Platelets 150 - 400 K/uL 215 218 248      CMP     Component Value Date/Time   NA 137 06/28/2021 0425   NA 136 12/24/2013 1117   K 4.3 06/28/2021 0425   K 4.2 01/13/2014 0958   CL 105 06/28/2021 0425   CL 98 12/24/2013 1117   CO2 26 06/28/2021 0425   CO2 31 12/24/2013 1117   GLUCOSE 83 06/28/2021 0425   GLUCOSE 90 12/24/2013 1117   BUN 12 06/28/2021 0425   BUN 8 12/24/2013 1117   CREATININE 0.58 06/28/2021 0425   CREATININE 0.74 07/23/2014 1441   CALCIUM 8.5 (L) 06/28/2021 0425   CALCIUM 8.7 12/24/2013 1117   PROT 5.7 (L) 06/22/2021 0429   PROT 6.6 06/24/2013 1112   ALBUMIN 2.8 (L) 06/22/2021 0429   ALBUMIN 3.1 (L) 06/24/2013 1112   AST 39 06/22/2021 0429   AST 26 06/24/2013 1112   ALT <5 06/22/2021 0429   ALT 22 06/24/2013 1112   ALKPHOS 62 06/22/2021 0429   ALKPHOS 101 06/24/2013 1112   BILITOT 0.5 06/22/2021 0429   BILITOT 0.2 06/24/2013 1112   GFRNONAA >60 06/28/2021 0425   GFRNONAA >60 07/23/2014 1441   GFRNONAA >60 12/24/2013 1117   GFRAA >60 12/20/2019 1846   GFRAA >60 07/23/2014 1441   GFRAA >60 12/24/2013 1117     No results found.     Assessment & Plan:    1. Lymphedema The patient's lower extremity edema is much improved.  At this time it would be sufficient to proceed with her knee replacement however the patient notes that it has been removed to after the start of the year.  Because of this we will keep the patient in the wraps to maintain control of the swelling as well as to prevent any recurrence of cellulitis to prevent any further delays.  We will have patient return in 6 weeks and evaluate where the status of her pending surgery is.  2. Primary hypertension Continue antihypertensive medications as already ordered, these medications have been reviewed and there are no changes at this time.   3. Left leg cellulitis This has resolved   Current Outpatient Medications on File Prior to Visit  Medication Sig Dispense Refill   allopurinol (ZYLOPRIM) 300 MG tablet Take 300 mg by mouth daily.      benzonatate (TESSALON) 100 MG capsule Take 100 mg by mouth 3 (three) times daily as needed for cough.     Calcium Carb-Cholecalciferol 600-400 MG-UNIT TABS Take 1 tablet by mouth 2 (two) times daily with a meal.     carbidopa-levodopa (SINEMET IR) 25-100 MG tablet Take 1.5 tablets by mouth 3 (three) times daily.     DULoxetine (CYMBALTA) 60 MG capsule Take 1 capsule by mouth 2 (two) times daily.     furosemide (LASIX) 40 MG tablet Hold until outpatient followup due to low blood pressure and patient stayed euvolemic. 30 tablet    gabapentin (  NEURONTIN) 600 MG tablet Take 600 mg by mouth 4 (four) times daily.  11   Homeopathic Products (PSORIASIS/ECZEMA RELIEF EX) Apply 1 application topically daily as needed (eczema).     hydrocortisone 2.5 % cream Apply topically 2 (two) times daily as needed. Home med. 30 g 0   levothyroxine (SYNTHROID, LEVOTHROID) 50 MCG tablet Take 50 mcg by mouth daily before breakfast.     Magnesium 400 MG CAPS Take 400 mg by mouth daily.     meloxicam (MOBIC) 15 MG tablet Take 1 tablet (15 mg total) by mouth daily as needed  for pain.     metoprolol tartrate (LOPRESSOR) 100 MG tablet Take 1 tablet (100 mg total) by mouth 2 (two) times daily. Reduced from 200 mg.     Multiple Vitamins tablet Take 2 tablets by mouth daily.      omeprazole (PRILOSEC) 20 MG capsule Take 20 mg by mouth daily.      oxybutynin (DITROPAN) 5 MG tablet Take 5 mg by mouth 2 (two) times daily.     triamcinolone (KENALOG) 0.025 % cream Apply 1 application topically 2 (two) times daily as needed. Home med. 30 g 0   vitamin B-12 (CYANOCOBALAMIN) 1000 MCG tablet Take 1,000 mcg by mouth daily.     warfarin (COUMADIN) 6 MG tablet Take 1 tablet (6 mg total) by mouth daily at 4 PM. Reduced from 7 mg.     No current facility-administered medications on file prior to visit.    There are no Patient Instructions on file for this visit. No follow-ups on file.   Kris Hartmann, NP

## 2021-10-19 ENCOUNTER — Ambulatory Visit (INDEPENDENT_AMBULATORY_CARE_PROVIDER_SITE_OTHER): Payer: Medicare HMO | Admitting: Nurse Practitioner

## 2021-10-20 ENCOUNTER — Other Ambulatory Visit: Payer: Self-pay

## 2021-10-20 ENCOUNTER — Ambulatory Visit (INDEPENDENT_AMBULATORY_CARE_PROVIDER_SITE_OTHER): Payer: Medicare HMO | Admitting: Nurse Practitioner

## 2021-10-20 VITALS — BP 122/85 | HR 88 | Ht 64.0 in | Wt 231.0 lb

## 2021-10-20 DIAGNOSIS — L03116 Cellulitis of left lower limb: Secondary | ICD-10-CM | POA: Diagnosis not present

## 2021-10-20 DIAGNOSIS — I10 Essential (primary) hypertension: Secondary | ICD-10-CM

## 2021-10-20 DIAGNOSIS — I890 Lymphedema, not elsewhere classified: Secondary | ICD-10-CM | POA: Diagnosis not present

## 2021-10-24 ENCOUNTER — Encounter (INDEPENDENT_AMBULATORY_CARE_PROVIDER_SITE_OTHER): Payer: Self-pay | Admitting: Nurse Practitioner

## 2021-10-24 NOTE — Progress Notes (Signed)
Subjective:    Patient ID: Suzanne Dixon, female    DOB: 1951-07-20, 71 y.o.   MRN: [id number] Chief Complaint  Patient presents with   Follow-up    6 wk unna boot    Suzanne Dixon is a 71 year old female that returns today in relation to swelling in her left lower extremity.  She developed cellulitis but wound we initially saw her she still had weeping and redness with significant swelling.  Because of this her right knee replacement was delayed due to worry of possible infection.  Today the swelling is greatly under control.  The patient notes that the pain is under much better control.  She has tolerated the Unna wraps well.   Review of Systems  Cardiovascular:  Positive for leg swelling.  Musculoskeletal:  Positive for arthralgias and gait problem.  All other systems reviewed and are negative.     Objective:   Physical Exam Vitals reviewed.  HENT:     Head: Normocephalic.  Cardiovascular:     Rate and Rhythm: Normal rate.  Pulmonary:     Effort: Pulmonary effort is normal.  Musculoskeletal:     Right lower leg: 2+ Edema present.     Left lower leg: 2+ Edema present.  Neurological:     Mental Status: She is alert and oriented to person, place, and time.  Psychiatric:        Mood and Affect: Mood normal.        Behavior: Behavior normal.        Thought Content: Thought content normal.        Judgment: Judgment normal.    BP 122/85    Pulse 88    Ht 5\' 4"  (1.626 m)    Wt 231 lb (104.8 kg)    BMI 39.65 kg/m   Past Medical History:  Diagnosis Date   A-fib ([place])    Anemia    B12 deficiency    Breast cancer ([place]) 03/31/2013   Right - chemo- mastectomy   Breast cancer ([place]) 1991   Rt.- radiation   Complication of anesthesia 01/2009   Recent years with general anesthesia had itching following surgery   DDD (degenerative disc disease), cervical    Depression    Diverticulitis    Dyspnea    Edema of both legs    GERD (gastroesophageal reflux disease)    Gout     H/O mastectomy 2014   per patient   H/O: cesarean section 1987   per patient report   H/O: hysterectomy 1996   per patient   Hydradenitis    Hypertension    Hypothyroidism    Neuropathy    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[place])    Parkinson's disease ([place])    Personal history of chemotherapy    Personal history of radiation therapy    Ruptured cervical disc 2002   per patient    Sleep apnea    cpap machine   Super obese    Tachycardia     Social History   Socioeconomic History   Marital status: Widowed    Spouse name: Not on file   Number of children: Not on file   Years of education: Not on file   Highest education level: Not on file  Occupational History   Not on file  Tobacco Use   Smoking status: Never   Smokeless tobacco: Never  Vaping Use   Vaping Use: Not on file  Substance and Sexual Activity   Alcohol use: No  Alcohol/week: 0.0 standard drinks   Drug use: No   Sexual activity: Not on file  Other Topics Concern   Not on file  Social History Narrative   Not on file   Social Determinants of Health   Financial Resource Strain: Not on file  Food Insecurity: Not on file  Transportation Needs: Not on file  Physical Activity: Not on file  Stress: Not on file  Social Connections: Not on file  Intimate Partner Violence: Not on file    Past Surgical History:  Procedure Laterality Date   ABDOMINAL HYSTERECTOMY     BACK SURGERY  2002   plate and 2 screws in neck   BREAST BIOPSY Right 1991,2014   Positive   BREAST CYST ASPIRATION Left    BREAST SURGERY Right 2014   mastectomy   CATARACT EXTRACTION W/PHACO Left 05/17/2019   Procedure: CATARACT EXTRACTION PHACO AND INTRAOCULAR LENS PLACEMENT ([place]);  Surgeon: Eulogio Bear, MD;  Location: [facility] ORS;  Service: Ophthalmology;  Laterality: Left;  [place] 00:28 CDE 2.15 FLUID PACK LOT # [id number]   CATARACT EXTRACTION W/PHACO Right 06/14/2019   Procedure: CATARACT EXTRACTION PHACO AND INTRAOCULAR LENS PLACEMENT ([facility])  RIGHT;  Surgeon: Eulogio Bear, MD;  Location: [facility] ORS;  Service: Ophthalmology;  Laterality: Right;  [place] 00:35.0 CDE 2.37 Fluid Pack Lot #[id number]   CESAREAN SECTION     CHOLECYSTECTOMY  04/09/2012   COLONOSCOPY WITH PROPOFOL N/A 06/22/2015   Procedure: COLONOSCOPY WITH PROPOFOL;  Surgeon: Manya Silvas, MD;  Location: [facility] ENDOSCOPY;  Service: Endoscopy;  Laterality: N/A;   COLONOSCOPY WITH PROPOFOL N/A 05/25/2021   Procedure: COLONOSCOPY WITH PROPOFOL;  Surgeon: Lesly Rubenstein, MD;  Location: [facility] ENDOSCOPY;  Service: Endoscopy;  Laterality: N/A;   ESOPHAGOGASTRODUODENOSCOPY     ESOPHAGOGASTRODUODENOSCOPY N/A 05/25/2021   Procedure: ESOPHAGOGASTRODUODENOSCOPY (EGD);  Surgeon: Lesly Rubenstein, MD;  Location: [facility] ENDOSCOPY;  Service: Endoscopy;  Laterality: N/A;   EYE SURGERY     LAPAROSCOPIC GASTRIC BYPASS     MASTECTOMY Right    MASTECTOMY MODIFIED RADICAL     mastectomy partial      lumpectomy   NEUROPLASTY / TRANSPOSITION ULNAR NERVE AT ELBOW     OOPHORECTOMY     PORT-A-CATH REMOVAL Left 05/13/2015   Procedure: REMOVAL PORT-A-CATH LEFT CHEST ;  Surgeon: Florene Glen, MD;  Location: [facility] ORS;  Service: General;  Laterality: Left;   REDUCTION MAMMAPLASTY Left    REVERSE SHOULDER ARTHROPLASTY Right 12/03/2020   Procedure: REVERSE SHOULDER ARTHROPLASTY;  Surgeon: Corky Mull, MD;  Location: [facility] ORS;  Service: Orthopedics;  Laterality: Right;   ROUX-EN-Y GASTRIC BYPASS  01/26/2009   SHOULDER ARTHROSCOPY WITH OPEN ROTATOR CUFF REPAIR Right 01/03/2017   Procedure: SHOULDER ARTHROSCOPY WITH OPEN ROTATOR CUFF REPAIR;  Surgeon: Corky Mull, MD;  Location: [facility] ORS;  Service: Orthopedics;  Laterality: Right;   SHOULDER ARTHROSCOPY WITH SUBACROMIAL DECOMPRESSION, ROTATOR CUFF REPAIR AND BICEP TENDON REPAIR Right 01/03/2017   Procedure: SHOULDER ARTHROSCOPY WITH SUBACROMIAL DECOMPRESSION, ROTATOR CUFF REPAIR AND BICEP TENDON REPAIR;  Surgeon: Corky Mull, MD;  Location: [facility]  ORS;  Service: Orthopedics;  Laterality: Right;  Limited debridement   SPINE SURGERY      Family History  Problem Relation Age of Onset   Hypertension Mother    Lymphoma Mother    Cancer Sister        Breast   Breast cancer Sister 22    Allergies  Allergen Reactions   Advair Diskus [Fluticasone-Salmeterol] Other (See Comments)  Joint pain    Alendronate     Muscle pain   Singulair [Montelukast] Other (See Comments)    "muscle pain"   Sulfa Antibiotics Hives   Venlafaxine Other (See Comments)    "altered mental status/tremors"    CBC Latest Ref Rng & Units 06/28/2021 06/26/2021 06/23/2021  WBC 4.0 - 10.5 K/uL 5.6 5.8 5.1  Hemoglobin 12.0 - 15.0 g/dL 12.1 13.1 13.1  Hematocrit 36.0 - 46.0 % 36.2 39.6 39.1  Platelets 150 - 400 K/uL 215 218 248      CMP     Component Value Date/Time   NA 137 06/28/2021 0425   NA 136 12/24/2013 1117   K 4.3 06/28/2021 0425   K 4.2 01/13/2014 0958   CL 105 06/28/2021 0425   CL 98 12/24/2013 1117   CO2 26 06/28/2021 0425   CO2 31 12/24/2013 1117   GLUCOSE 83 06/28/2021 0425   GLUCOSE 90 12/24/2013 1117   BUN 12 06/28/2021 0425   BUN 8 12/24/2013 1117   CREATININE 0.58 06/28/2021 0425   CREATININE 0.74 07/23/2014 1441   CALCIUM 8.5 (L) 06/28/2021 0425   CALCIUM 8.7 12/24/2013 1117   PROT 5.7 (L) 06/22/2021 0429   PROT 6.6 06/24/2013 1112   ALBUMIN 2.8 (L) 06/22/2021 0429   ALBUMIN 3.1 (L) 06/24/2013 1112   AST 39 06/22/2021 0429   AST 26 06/24/2013 1112   ALT <5 06/22/2021 0429   ALT 22 06/24/2013 1112   ALKPHOS 62 06/22/2021 0429   ALKPHOS 101 06/24/2013 1112   BILITOT 0.5 06/22/2021 0429   BILITOT 0.2 06/24/2013 1112   GFRNONAA >60 06/28/2021 0425   GFRNONAA >60 07/23/2014 1441   GFRNONAA >60 12/24/2013 1117   GFRAA >60 12/20/2019 1846   GFRAA >60 07/23/2014 1441   GFRAA >60 12/24/2013 1117     No results found.     Assessment & Plan:   1. Lymphedema At the moment the wraps are doing very good job helping  control the patient's swelling.  She will remain in wraps to be changed by home health.  We will keep the patient in wraps to keep her cellulitis at [place] until her knee surgery can be rescheduled so there are no further delays.  2. Left leg cellulitis resolved  3. Essential hypertension Continue antihypertensive medications as already ordered, these medications have been reviewed and there are no changes at this time.    Current Outpatient Medications on File Prior to Visit  Medication Sig Dispense Refill   allopurinol (ZYLOPRIM) 300 MG tablet Take 300 mg by mouth daily.      benzonatate (TESSALON) 100 MG capsule Take 100 mg by mouth 3 (three) times daily as needed for cough.     Calcium Carb-Cholecalciferol 600-400 MG-UNIT TABS Take 1 tablet by mouth 2 (two) times daily with a meal.     carbidopa-levodopa (SINEMET IR) 25-100 MG tablet Take 1.5 tablets by mouth 3 (three) times daily.     DULoxetine (CYMBALTA) 60 MG capsule Take 1 capsule by mouth 2 (two) times daily.     furosemide (LASIX) 40 MG tablet Hold until outpatient followup due to low blood pressure and patient stayed euvolemic. 30 tablet    gabapentin (NEURONTIN) 600 MG tablet Take 600 mg by mouth 4 (four) times daily.  11   Homeopathic Products (PSORIASIS/ECZEMA RELIEF EX) Apply 1 application topically daily as needed (eczema).     hydrocortisone 2.5 % cream Apply topically 2 (two) times daily as needed. Home med. 30 g  0   levothyroxine (SYNTHROID, LEVOTHROID) 50 MCG tablet Take 50 mcg by mouth daily before breakfast.     Magnesium 400 MG CAPS Take 400 mg by mouth daily.     meloxicam (MOBIC) 15 MG tablet Take 1 tablet (15 mg total) by mouth daily as needed for pain.     metoprolol tartrate (LOPRESSOR) 100 MG tablet Take 1 tablet (100 mg total) by mouth 2 (two) times daily. Reduced from 200 mg.     Multiple Vitamins tablet Take 2 tablets by mouth daily.      omeprazole (PRILOSEC) 20 MG capsule Take 20 mg by mouth daily.       oxybutynin (DITROPAN) 5 MG tablet Take 5 mg by mouth 2 (two) times daily.     oxybutynin (DITROPAN) 5 MG tablet Take 1 tablet by mouth 2 (two) times daily.     triamcinolone (KENALOG) 0.025 % cream Apply 1 application topically 2 (two) times daily as needed. Home med. 30 g 0   vitamin B-12 (CYANOCOBALAMIN) 1000 MCG tablet Take 1,000 mcg by mouth daily.     warfarin (COUMADIN) 6 MG tablet Take 1 tablet (6 mg total) by mouth daily at 4 PM. Reduced from 7 mg.     potassium chloride (KLOR-CON) 10 MEQ tablet Take 10 mEq by mouth 3 (three) times daily.     No current facility-administered medications on file prior to visit.    There are no Patient Instructions on file for this visit. No follow-ups on file.   Kris Hartmann, NP

## 2021-12-15 ENCOUNTER — Ambulatory Visit (INDEPENDENT_AMBULATORY_CARE_PROVIDER_SITE_OTHER): Payer: Medicare HMO | Admitting: Nurse Practitioner

## 2021-12-15 ENCOUNTER — Other Ambulatory Visit: Payer: Self-pay

## 2021-12-15 ENCOUNTER — Encounter (INDEPENDENT_AMBULATORY_CARE_PROVIDER_SITE_OTHER): Payer: Self-pay | Admitting: Nurse Practitioner

## 2021-12-15 VITALS — BP 119/76 | HR 74 | Resp 16

## 2021-12-15 DIAGNOSIS — I890 Lymphedema, not elsewhere classified: Secondary | ICD-10-CM

## 2021-12-15 DIAGNOSIS — I10 Essential (primary) hypertension: Secondary | ICD-10-CM

## 2021-12-27 ENCOUNTER — Encounter (INDEPENDENT_AMBULATORY_CARE_PROVIDER_SITE_OTHER): Payer: Self-pay | Admitting: Nurse Practitioner

## 2021-12-27 NOTE — Progress Notes (Signed)
? ?Subjective:  ? ? Patient ID: Suzanne Dixon, female    DOB: 1951-03-28, 71 y.o.   MRN: [id number] ?Chief Complaint  ?Patient presents with  ? Follow-up  ?  8 weeks follow up  ? ? ?Suzanne Dixon is a 71 year old female that follows up in regards to her leg swelling.  She has been doing much better and recently out of the Unna wraps.  She has been able to transition into medical grade compression stockings.  She notes that she is feeling much better and find herself being much more active.  She denies any open wounds or ulcerations.  Swelling is under much better control than it previously has been. ? ? ?Review of Systems  ?Cardiovascular:  Positive for leg swelling.  ?Musculoskeletal:  Positive for gait problem.  ?All other systems reviewed and are negative. ? ?   ?Objective:  ? Physical Exam ?Vitals reviewed.  ?HENT:  ?   Head: Normocephalic.  ?Cardiovascular:  ?   Rate and Rhythm: Normal rate.  ?Pulmonary:  ?   Effort: Pulmonary effort is normal.  ?Musculoskeletal:  ?   Right lower leg: 1+ Edema present.  ?   Left lower leg: 1+ Edema present.  ?Skin: ?   General: Skin is warm and dry.  ?Neurological:  ?   Mental Status: She is alert and oriented to person, place, and time.  ?   Gait: Gait abnormal.  ?Psychiatric:     ?   Mood and Affect: Mood normal.     ?   Behavior: Behavior normal.     ?   Thought Content: Thought content normal.     ?   Judgment: Judgment normal.  ? ? ?BP 119/76 (BP Location: Left Arm)   Pulse 74   Resp 16  ? ?Past Medical History:  ?Diagnosis Date  ? A-fib ([place])   ? Anemia   ? B12 deficiency   ? Breast cancer ([place]) 03/31/2013  ? Right - chemo- mastectomy  ? Breast cancer ([place]) 1991  ? Rt.- radiation  ? Complication of anesthesia 01/2009  ? Recent years with general anesthesia had itching following surgery  ? DDD (degenerative disc disease), cervical   ? Depression   ? Diverticulitis   ? Dyspnea   ? Edema of both legs   ? GERD (gastroesophageal reflux disease)   ? Gout   ? H/O mastectomy 2014   ? per patient  ? H/O: cesarean section 1987  ? per patient report  ? H/O: hysterectomy 1996  ? per patient  ? Hydradenitis   ? Hypertension   ? Hypothyroidism   ? Neuropathy   ? 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 Parkinson's disease ([place])   ? Personal history of chemotherapy   ? Personal history of radiation therapy   ? Ruptured cervical disc 2002  ? per patient   ? Sleep apnea   ? cpap machine  ? Super obese   ? Tachycardia   ? ? ?Social History  ? ?Socioeconomic History  ? Marital status: Widowed  ?  Spouse name: Not on file  ? Number of children: Not on file  ? Years of education: Not on file  ? Highest education level: Not on file  ?Occupational History  ? Not on file  ?Tobacco Use  ? Smoking status: Never  ? Smokeless tobacco: Never  ?Vaping Use  ? Vaping Use: Not on file  ?Substance and Sexual Activity  ? Alcohol use: No  ?  Alcohol/week: 0.0 standard drinks  ? Drug use: No  ? Sexual activity: Not on file  ?Other Topics Concern  ? Not on file  ?Social History Narrative  ? Not on file  ? ?Social Determinants of Health  ? ?Financial Resource Strain: Not on file  ?Food Insecurity: Not on file  ?Transportation Needs: Not on file  ?Physical Activity: Not on file  ?Stress: Not on file  ?Social Connections: Not on file  ?Intimate Partner Violence: Not on file  ? ? ?Past Surgical History:  ?Procedure Laterality Date  ? ABDOMINAL HYSTERECTOMY    ? BACK SURGERY  2002  ? plate and 2 screws in neck  ? BREAST BIOPSY Right 1991,2014  ? Positive  ? BREAST CYST ASPIRATION Left   ? BREAST SURGERY Right 2014  ? mastectomy  ? CATARACT EXTRACTION W/PHACO Left 05/17/2019  ? Procedure: CATARACT EXTRACTION PHACO AND INTRAOCULAR LENS PLACEMENT (IOC);  Surgeon: Eulogio Bear, MD;  Location: [facility] ORS;  Service: Ophthalmology;  Laterality: Left;  [place] 00:28 ?CDE 2.15 ?FLUID PACK LOT # [id number]  ? CATARACT EXTRACTION W/PHACO Right 06/14/2019  ? Procedure: CATARACT EXTRACTION PHACO AND INTRAOCULAR LENS PLACEMENT ([place]) RIGHT;  Surgeon:  Eulogio Bear, MD;  Location: [facility] ORS;  Service: Ophthalmology;  Laterality: Right;  [place] 00:35.0 ?CDE 2.37 ?Fluid Pack Lot #[id number] H  ? CESAREAN SECTION    ? CHOLECYSTECTOMY  04/09/2012  ? COLONOSCOPY WITH PROPOFOL N/A 06/22/2015  ? Procedure: COLONOSCOPY WITH PROPOFOL;  Surgeon: Manya Silvas, MD;  Location: [facility] ENDOSCOPY;  Service: Endoscopy;  Laterality: N/A;  ? COLONOSCOPY WITH PROPOFOL N/A 05/25/2021  ? Procedure: COLONOSCOPY WITH PROPOFOL;  Surgeon: Lesly Rubenstein, MD;  Location: [facility] ENDOSCOPY;  Service: Endoscopy;  Laterality: N/A;  ? ESOPHAGOGASTRODUODENOSCOPY    ? ESOPHAGOGASTRODUODENOSCOPY N/A 05/25/2021  ? Procedure: ESOPHAGOGASTRODUODENOSCOPY (EGD);  Surgeon: Lesly Rubenstein, MD;  Location: [facility] ENDOSCOPY;  Service: Endoscopy;  Laterality: N/A;  ? EYE SURGERY    ? LAPAROSCOPIC GASTRIC BYPASS    ? MASTECTOMY Right   ? MASTECTOMY MODIFIED RADICAL    ? mastectomy partial     ? lumpectomy  ? NEUROPLASTY / TRANSPOSITION ULNAR NERVE AT ELBOW    ? OOPHORECTOMY    ? PORT-A-CATH REMOVAL Left 05/13/2015  ? Procedure: REMOVAL PORT-A-CATH LEFT CHEST ;  Surgeon: Florene Glen, MD;  Location: [facility] ORS;  Service: General;  Laterality: Left;  ? REDUCTION MAMMAPLASTY Left   ? REVERSE SHOULDER ARTHROPLASTY Right 12/03/2020  ? Procedure: REVERSE SHOULDER ARTHROPLASTY;  Surgeon: Corky Mull, MD;  Location: [facility] ORS;  Service: Orthopedics;  Laterality: Right;  ? ROUX-EN-Y GASTRIC BYPASS  01/26/2009  ? SHOULDER ARTHROSCOPY WITH OPEN ROTATOR CUFF REPAIR Right 01/03/2017  ? Procedure: SHOULDER ARTHROSCOPY WITH OPEN ROTATOR CUFF REPAIR;  Surgeon: Corky Mull, MD;  Location: [facility] ORS;  Service: Orthopedics;  Laterality: Right;  ? SHOULDER ARTHROSCOPY WITH SUBACROMIAL DECOMPRESSION, ROTATOR CUFF REPAIR AND BICEP TENDON REPAIR Right 01/03/2017  ? Procedure: SHOULDER ARTHROSCOPY WITH SUBACROMIAL DECOMPRESSION, ROTATOR CUFF REPAIR AND BICEP TENDON REPAIR;  Surgeon: Corky Mull, MD;  Location: [facility] ORS;  Service:  Orthopedics;  Laterality: Right;  Limited debridement  ? SPINE SURGERY    ? ? ?Family History  ?Problem Relation Age of Onset  ? Hypertension Mother   ? Lymphoma Mother   ? Cancer Sister   ?     Breast  ? Breast cancer Sister 5  ? ? ?Allergies  ?Allergen Reactions  ? Advair Diskus [Fluticasone-Salmeterol] Other (See Comments)  ?  Joint pain   ? Alendronate   ?  Muscle pain  ? Singulair [Montelukast] Other (See Comments)  ?  "muscle pain"  ? Sulfa Antibiotics Hives  ? Venlafaxine Other (See Comments)  ?  "altered mental status/tremors"  ? ? ? ?  Latest Ref Rng & Units 06/28/2021  ?  4:25 AM 06/26/2021  ?  4:33 AM 06/23/2021  ?  4:54 AM  ?CBC  ?WBC 4.0 - 10.5 K/uL 5.6   5.8   5.1    ?Hemoglobin 12.0 - 15.0 g/dL 12.1   13.1   13.1    ?Hematocrit 36.0 - 46.0 % 36.2   39.6   39.1    ?Platelets 150 - 400 K/uL 215   218   248    ? ? ? ? ?CMP  ?   ?Component Value Date/Time  ? NA 137 06/28/2021 0425  ? NA 136 12/24/2013 1117  ? K 4.3 06/28/2021 0425  ? K 4.2 01/13/2014 0958  ? CL 105 06/28/2021 0425  ? CL 98 12/24/2013 1117  ? CO2 26 06/28/2021 0425  ? CO2 31 12/24/2013 1117  ? GLUCOSE 83 06/28/2021 0425  ? GLUCOSE 90 12/24/2013 1117  ? BUN 12 06/28/2021 0425  ? BUN 8 12/24/2013 1117  ? CREATININE 0.58 06/28/2021 0425  ? CREATININE 0.74 07/23/2014 1441  ? CALCIUM 8.5 (L) 06/28/2021 0425  ? CALCIUM 8.7 12/24/2013 1117  ? PROT 5.7 (L) 06/22/2021 0429  ? PROT 6.6 06/24/2013 1112  ? ALBUMIN 2.8 (L) 06/22/2021 0429  ? ALBUMIN 3.1 (L) 06/24/2013 1112  ? AST 39 06/22/2021 0429  ? AST 26 06/24/2013 1112  ? ALT <5 06/22/2021 0429  ? ALT 22 06/24/2013 1112  ? ALKPHOS 62 06/22/2021 0429  ? ALKPHOS 101 06/24/2013 1112  ? BILITOT 0.5 06/22/2021 0429  ? BILITOT 0.2 06/24/2013 1112  ? GFRNONAA >60 06/28/2021 0425  ? GFRNONAA >60 07/23/2014 1441  ? GFRNONAA >60 12/24/2013 1117  ? GFRAA >60 12/20/2019 1846  ? GFRAA >60 07/23/2014 1441  ? GFRAA >60 12/24/2013 1117  ? ? ? ?No results found. ? ?   ?Assessment & Plan:  ? ?1. Lymphedema ?The  patient's lower extremity edema is doing much better when she is out of Unna boots and into compression socks.  She is also elevating her lower extremities as much as possible.  The patient will not undergo any p

## 2022-02-08 ENCOUNTER — Other Ambulatory Visit: Payer: Self-pay | Admitting: Internal Medicine

## 2022-02-08 DIAGNOSIS — R519 Headache, unspecified: Secondary | ICD-10-CM

## 2022-02-16 ENCOUNTER — Ambulatory Visit
Admission: RE | Admit: 2022-02-16 | Discharge: 2022-02-16 | Disposition: A | Discharge status: Home / Self Care | Payer: Medicare HMO | Source: Ambulatory Visit | Attending: Internal Medicine | Admitting: Internal Medicine

## 2022-02-16 DIAGNOSIS — R519 Headache, unspecified: Secondary | ICD-10-CM | POA: Diagnosis present

## 2022-03-16 ENCOUNTER — Ambulatory Visit (INDEPENDENT_AMBULATORY_CARE_PROVIDER_SITE_OTHER): Payer: Medicare HMO | Admitting: Nurse Practitioner

## 2022-04-08 ENCOUNTER — Ambulatory Visit (INDEPENDENT_AMBULATORY_CARE_PROVIDER_SITE_OTHER): Payer: Medicare HMO | Admitting: Nurse Practitioner

## 2022-04-08 ENCOUNTER — Encounter (INDEPENDENT_AMBULATORY_CARE_PROVIDER_SITE_OTHER): Payer: Self-pay | Admitting: Nurse Practitioner

## 2022-04-08 VITALS — BP 133/82 | HR 81 | Resp 17

## 2022-04-08 DIAGNOSIS — I10 Essential (primary) hypertension: Secondary | ICD-10-CM | POA: Diagnosis not present

## 2022-04-08 DIAGNOSIS — I890 Lymphedema, not elsewhere classified: Secondary | ICD-10-CM | POA: Diagnosis not present

## 2022-04-25 NOTE — Progress Notes (Signed)
Subjective:    Patient ID: Suzanne Dixon, female    DOB: December 10, 1950, 71 y.o.   MRN: [id number] No chief complaint on file.   Suzanne Dixon is a 71 year old female that follows up in regards to her leg swelling.  She has been doing much better and recently out of the Unna wraps.  She has been able to transition into medical grade compression stockings.  She notes that she is feeling much better and find herself being much more active.  She denies any open wounds or ulcerations.  Swelling is under much better control than it previously has been.    Review of Systems  Cardiovascular:  Positive for leg swelling.  All other systems reviewed and are negative.      Objective:   Physical Exam Vitals reviewed.  HENT:     Head: Normocephalic.  Cardiovascular:     Rate and Rhythm: Normal rate.  Pulmonary:     Effort: Pulmonary effort is normal.  Musculoskeletal:     Right lower leg: Edema present.     Left lower leg: Edema present.  Skin:    General: Skin is warm and dry.  Neurological:     Mental Status: She is alert and oriented to person, place, and time.     Motor: Weakness present.  Psychiatric:        Mood and Affect: Mood normal.        Behavior: Behavior normal.        Thought Content: Thought content normal.        Judgment: Judgment normal.     BP 133/82 (BP Location: Left Arm)   Pulse 81   Resp 17   Past Medical History:  Diagnosis Date   A-fib ([place])    Anemia    B12 deficiency    Breast cancer ([place]) 03/31/2013   Right - chemo- mastectomy   Breast cancer ([place]) 1991   Rt.- radiation   Complication of anesthesia 01/2009   Recent years with general anesthesia had itching following surgery   DDD (degenerative disc disease), cervical    Depression    Diverticulitis    Dyspnea    Edema of both legs    GERD (gastroesophageal reflux disease)    Gout    H/O mastectomy 2014   per patient   H/O: cesarean section 1987   per patient report   H/O: hysterectomy  1996   per patient   Hydradenitis    Hypertension    Hypothyroidism    Neuropathy    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[place])    Parkinson's disease ([place])    Personal history of chemotherapy    Personal history of radiation therapy    Ruptured cervical disc 2002   per patient    Sleep apnea    cpap machine   Super obese    Tachycardia     Social History   Socioeconomic History   Marital status: Widowed    Spouse name: Not on file   Number of children: Not on file   Years of education: Not on file   Highest education level: Not on file  Occupational History   Not on file  Tobacco Use   Smoking status: Never   Smokeless tobacco: Never  Vaping Use   Vaping Use: Not on file  Substance and Sexual Activity   Alcohol use: No    Alcohol/week: 0.0 standard drinks of alcohol   Drug use: No   Sexual activity: Not on file  Other Topics Concern   Not on file  Social History Narrative   Not on file   Social Determinants of Health   Financial Resource Strain: Not on file  Food Insecurity: Not on file  Transportation Needs: Not on file  Physical Activity: Not on file  Stress: Not on file  Social Connections: Not on file  Intimate Partner Violence: Not on file    Past Surgical History:  Procedure Laterality Date   ABDOMINAL HYSTERECTOMY     BACK SURGERY  2002   plate and 2 screws in neck   BREAST BIOPSY Right 1991,2014   Positive   BREAST CYST ASPIRATION Left    BREAST SURGERY Right 2014   mastectomy   CATARACT EXTRACTION W/PHACO Left 05/17/2019   Procedure: CATARACT EXTRACTION PHACO AND INTRAOCULAR LENS PLACEMENT ([place]);  Surgeon: Eulogio Bear, MD;  Location: [facility] ORS;  Service: Ophthalmology;  Laterality: Left;  [place] 00:28 CDE 2.15 FLUID PACK LOT # [id number]   CATARACT EXTRACTION W/PHACO Right 06/14/2019   Procedure: CATARACT EXTRACTION PHACO AND INTRAOCULAR LENS PLACEMENT (I[facility]) RIGHT;  Surgeon: Eulogio Bear, MD;  Location: [facility] ORS;  Service: Ophthalmology;   Laterality: Right;  [place] 00:35.0 CDE 2.37 Fluid Pack Lot #[id number]   CESAREAN SECTION     CHOLECYSTECTOMY  04/09/2012   COLONOSCOPY WITH PROPOFOL N/A 06/22/2015   Procedure: COLONOSCOPY WITH PROPOFOL;  Surgeon: Manya Silvas, MD;  Location: [facility] ENDOSCOPY;  Service: Endoscopy;  Laterality: N/A;   COLONOSCOPY WITH PROPOFOL N/A 05/25/2021   Procedure: COLONOSCOPY WITH PROPOFOL;  Surgeon: Lesly Rubenstein, MD;  Location: [facility] ENDOSCOPY;  Service: Endoscopy;  Laterality: N/A;   ESOPHAGOGASTRODUODENOSCOPY     ESOPHAGOGASTRODUODENOSCOPY N/A 05/25/2021   Procedure: ESOPHAGOGASTRODUODENOSCOPY (EGD);  Surgeon: Lesly Rubenstein, MD;  Location: [facility] ENDOSCOPY;  Service: Endoscopy;  Laterality: N/A;   EYE SURGERY     LAPAROSCOPIC GASTRIC BYPASS     MASTECTOMY Right    MASTECTOMY MODIFIED RADICAL     mastectomy partial      lumpectomy   NEUROPLASTY / TRANSPOSITION ULNAR NERVE AT ELBOW     OOPHORECTOMY     PORT-A-CATH REMOVAL Left 05/13/2015   Procedure: REMOVAL PORT-A-CATH LEFT CHEST ;  Surgeon: Florene Glen, MD;  Location: [facility] ORS;  Service: General;  Laterality: Left;   REDUCTION MAMMAPLASTY Left    REVERSE SHOULDER ARTHROPLASTY Right 12/03/2020   Procedure: REVERSE SHOULDER ARTHROPLASTY;  Surgeon: Corky Mull, MD;  Location: [facility] ORS;  Service: Orthopedics;  Laterality: Right;   ROUX-EN-Y GASTRIC BYPASS  01/26/2009   SHOULDER ARTHROSCOPY WITH OPEN ROTATOR CUFF REPAIR Right 01/03/2017   Procedure: SHOULDER ARTHROSCOPY WITH OPEN ROTATOR CUFF REPAIR;  Surgeon: Corky Mull, MD;  Location: [facility] ORS;  Service: Orthopedics;  Laterality: Right;   SHOULDER ARTHROSCOPY WITH SUBACROMIAL DECOMPRESSION, ROTATOR CUFF REPAIR AND BICEP TENDON REPAIR Right 01/03/2017   Procedure: SHOULDER ARTHROSCOPY WITH SUBACROMIAL DECOMPRESSION, ROTATOR CUFF REPAIR AND BICEP TENDON REPAIR;  Surgeon: Corky Mull, MD;  Location: [facility] ORS;  Service: Orthopedics;  Laterality: Right;  Limited debridement   SPINE SURGERY       Family History  Problem Relation Age of Onset   Hypertension Mother    Lymphoma Mother    Cancer Sister        Breast   Breast cancer Sister 73    Allergies  Allergen Reactions   Advair Diskus [Fluticasone-Salmeterol] Other (See Comments)    Joint pain    Alendronate     Muscle pain   Singulair [  Montelukast] Other (See Comments)    "muscle pain"   Sulfa Antibiotics Hives   Venlafaxine Other (See Comments)    "altered mental status/tremors"       Latest Ref Rng & Units 06/28/2021    4:25 AM 06/26/2021    4:33 AM 06/23/2021    4:54 AM  CBC  WBC 4.0 - 10.5 K/uL 5.6  5.8  5.1   Hemoglobin 12.0 - 15.0 g/dL 12.1  13.1  13.1   Hematocrit 36.0 - 46.0 % 36.2  39.6  39.1   Platelets 150 - 400 K/uL 215  218  248       CMP     Component Value Date/Time   NA 137 06/28/2021 0425   NA 136 12/24/2013 1117   K 4.3 06/28/2021 0425   K 4.2 01/13/2014 0958   CL 105 06/28/2021 0425   CL 98 12/24/2013 1117   CO2 26 06/28/2021 0425   CO2 31 12/24/2013 1117   GLUCOSE 83 06/28/2021 0425   GLUCOSE 90 12/24/2013 1117   BUN 12 06/28/2021 0425   BUN 8 12/24/2013 1117   CREATININE 0.58 06/28/2021 0425   CREATININE 0.74 07/23/2014 1441   CALCIUM 8.5 (L) 06/28/2021 0425   CALCIUM 8.7 12/24/2013 1117   PROT 5.7 (L) 06/22/2021 0429   PROT 6.6 06/24/2013 1112   ALBUMIN 2.8 (L) 06/22/2021 0429   ALBUMIN 3.1 (L) 06/24/2013 1112   AST 39 06/22/2021 0429   AST 26 06/24/2013 1112   ALT <5 06/22/2021 0429   ALT 22 06/24/2013 1112   ALKPHOS 62 06/22/2021 0429   ALKPHOS 101 06/24/2013 1112   BILITOT 0.5 06/22/2021 0429   BILITOT 0.2 06/24/2013 1112   GFRNONAA >60 06/28/2021 0425   GFRNONAA >60 07/23/2014 1441   GFRNONAA >60 12/24/2013 1117   GFRAA >60 12/20/2019 1846   GFRAA >60 07/23/2014 1441   GFRAA >60 12/24/2013 1117     No results found.     Assessment & Plan:   1. Lymphedema Recommend:  No surgery or intervention at this point in time.    I have reviewed my  discussion with the patient regarding venous insufficiency and secondary lymph edema and why it  causes symptoms. I have discussed with the patient the chronic skin changes that accompany these problems and the long term sequela such as ulceration and infection.  Patient will continue wearing graduated compression on a daily basis a prescription, if needed, was given to the patient to keep this updated. The patient will  put the compression on first thing in the morning and removing them in the evening. The patient is instructed specifically not to sleep in the compression.  In addition, behavioral modification including elevation during the day will be continued.  Diet and salt restriction will also be helpful.  Previous duplex ultrasound of the lower extremities shows normal deep venous system, superficial reflux was not present.   Following the review of the ultrasound the patient will follow up in 12 months to reassess the degree of swelling and the control that graduated compression is offering.   The patient can be assessed for a Lymph Pump at that time.  However, at this time the patient states they are satisfied with the control compression and elevation is yielding.     2. Primary hypertension Continue antihypertensive medications as already ordered, these medications have been reviewed and there are no changes at this time.    Current Outpatient Medications on File Prior to Visit  Medication  Sig Dispense Refill   allopurinol (ZYLOPRIM) 300 MG tablet Take 300 mg by mouth daily.      benzonatate (TESSALON) 100 MG capsule Take 100 mg by mouth 3 (three) times daily as needed for cough.     Calcium Carb-Cholecalciferol 600-400 MG-UNIT TABS Take 1 tablet by mouth 2 (two) times daily with a meal.     carbidopa-levodopa (SINEMET IR) 25-100 MG tablet Take 1.5 tablets by mouth 3 (three) times daily.     DULoxetine (CYMBALTA) 60 MG capsule Take 1 capsule by mouth 2 (two) times daily.     furosemide  (LASIX) 40 MG tablet Hold until outpatient followup due to low blood pressure and patient stayed euvolemic. 30 tablet    gabapentin (NEURONTIN) 600 MG tablet Take 600 mg by mouth 4 (four) times daily.  11   Homeopathic Products (PSORIASIS/ECZEMA RELIEF EX) Apply 1 application topically daily as needed (eczema).     levothyroxine (SYNTHROID, LEVOTHROID) 50 MCG tablet Take 50 mcg by mouth daily before breakfast.     Magnesium 400 MG CAPS Take 400 mg by mouth daily.     meloxicam (MOBIC) 15 MG tablet Take 1 tablet (15 mg total) by mouth daily as needed for pain.     metoprolol tartrate (LOPRESSOR) 100 MG tablet Take 1 tablet (100 mg total) by mouth 2 (two) times daily. Reduced from 200 mg.     Multiple Vitamins tablet Take 2 tablets by mouth daily.      omeprazole (PRILOSEC) 20 MG capsule Take 20 mg by mouth daily.      oxybutynin (DITROPAN) 5 MG tablet Take 5 mg by mouth 2 (two) times daily.     oxybutynin (DITROPAN) 5 MG tablet Take 1 tablet by mouth 2 (two) times daily.     triamcinolone (KENALOG) 0.025 % cream Apply 1 application topically 2 (two) times daily as needed. Home med. 30 g 0   vitamin B-12 (CYANOCOBALAMIN) 1000 MCG tablet Take 1,000 mcg by mouth daily.     warfarin (COUMADIN) 6 MG tablet Take 1 tablet (6 mg total) by mouth daily at 4 PM. Reduced from 7 mg.     hydrocortisone 2.5 % cream Apply topically 2 (two) times daily as needed. Home med. (Patient not taking: Reported on 04/08/2022) 30 g 0   potassium chloride (KLOR-CON) 10 MEQ tablet Take 10 mEq by mouth 3 (three) times daily. (Patient not taking: Reported on 04/08/2022)     No current facility-administered medications on file prior to visit.    There are no Patient Instructions on file for this visit. No follow-ups on file.   Kris Hartmann, NP

## 2022-05-20 ENCOUNTER — Inpatient Hospital Stay
Admission: EM | Admit: 2022-05-20 | Discharge: 2022-05-24 | DRG: 689 | Disposition: A | Discharge status: Home Health | Payer: Medicare HMO | Attending: Internal Medicine | Admitting: Internal Medicine

## 2022-05-20 ENCOUNTER — Other Ambulatory Visit: Payer: Self-pay

## 2022-05-20 ENCOUNTER — Emergency Department: Payer: Medicare HMO

## 2022-05-20 DIAGNOSIS — Z9221 Personal history of antineoplastic chemotherapy: Secondary | ICD-10-CM

## 2022-05-20 DIAGNOSIS — G9341 Metabolic encephalopathy: Secondary | ICD-10-CM | POA: Diagnosis present

## 2022-05-20 DIAGNOSIS — Z9884 Bariatric surgery status: Secondary | ICD-10-CM

## 2022-05-20 DIAGNOSIS — I4821 Permanent atrial fibrillation: Secondary | ICD-10-CM | POA: Diagnosis present

## 2022-05-20 DIAGNOSIS — I110 Hypertensive heart disease with heart failure: Secondary | ICD-10-CM | POA: Diagnosis present

## 2022-05-20 DIAGNOSIS — I890 Lymphedema, not elsewhere classified: Secondary | ICD-10-CM | POA: Diagnosis present

## 2022-05-20 DIAGNOSIS — M5030 Other cervical disc degeneration, unspecified cervical region: Secondary | ICD-10-CM | POA: Diagnosis present

## 2022-05-20 DIAGNOSIS — E785 Hyperlipidemia, unspecified: Secondary | ICD-10-CM | POA: Diagnosis present

## 2022-05-20 DIAGNOSIS — G629 Polyneuropathy, unspecified: Secondary | ICD-10-CM | POA: Diagnosis present

## 2022-05-20 DIAGNOSIS — E039 Hypothyroidism, unspecified: Secondary | ICD-10-CM | POA: Diagnosis present

## 2022-05-20 DIAGNOSIS — Z961 Presence of intraocular lens: Secondary | ICD-10-CM | POA: Diagnosis present

## 2022-05-20 DIAGNOSIS — Z90722 Acquired absence of ovaries, bilateral: Secondary | ICD-10-CM

## 2022-05-20 DIAGNOSIS — Z96611 Presence of right artificial shoulder joint: Secondary | ICD-10-CM | POA: Diagnosis present

## 2022-05-20 DIAGNOSIS — Z7901 Long term (current) use of anticoagulants: Secondary | ICD-10-CM

## 2022-05-20 DIAGNOSIS — G4733 Obstructive sleep apnea (adult) (pediatric): Secondary | ICD-10-CM | POA: Diagnosis present

## 2022-05-20 DIAGNOSIS — Z923 Personal history of irradiation: Secondary | ICD-10-CM

## 2022-05-20 DIAGNOSIS — Z803 Family history of malignant neoplasm of breast: Secondary | ICD-10-CM

## 2022-05-20 DIAGNOSIS — A419 Sepsis, unspecified organism: Secondary | ICD-10-CM

## 2022-05-20 DIAGNOSIS — Z9049 Acquired absence of other specified parts of digestive tract: Secondary | ICD-10-CM

## 2022-05-20 DIAGNOSIS — N390 Urinary tract infection, site not specified: Secondary | ICD-10-CM | POA: Diagnosis not present

## 2022-05-20 DIAGNOSIS — E669 Obesity, unspecified: Secondary | ICD-10-CM | POA: Diagnosis present

## 2022-05-20 DIAGNOSIS — Z8249 Family history of ischemic heart disease and other diseases of the circulatory system: Secondary | ICD-10-CM

## 2022-05-20 DIAGNOSIS — Z6837 Body mass index (BMI) 37.0-37.9, adult: Secondary | ICD-10-CM

## 2022-05-20 DIAGNOSIS — G4730 Sleep apnea, unspecified: Secondary | ICD-10-CM | POA: Diagnosis present

## 2022-05-20 DIAGNOSIS — F329 Major depressive disorder, single episode, unspecified: Secondary | ICD-10-CM | POA: Diagnosis present

## 2022-05-20 DIAGNOSIS — Z9842 Cataract extraction status, left eye: Secondary | ICD-10-CM

## 2022-05-20 DIAGNOSIS — M1A9XX0 Chronic gout, unspecified, without tophus (tophi): Secondary | ICD-10-CM | POA: Diagnosis present

## 2022-05-20 DIAGNOSIS — Z853 Personal history of malignant neoplasm of breast: Secondary | ICD-10-CM

## 2022-05-20 DIAGNOSIS — R531 Weakness: Secondary | ICD-10-CM

## 2022-05-20 DIAGNOSIS — F05 Delirium due to known physiological condition: Secondary | ICD-10-CM | POA: Diagnosis not present

## 2022-05-20 DIAGNOSIS — E872 Acidosis, unspecified: Secondary | ICD-10-CM | POA: Diagnosis present

## 2022-05-20 DIAGNOSIS — Z90710 Acquired absence of both cervix and uterus: Secondary | ICD-10-CM

## 2022-05-20 DIAGNOSIS — Z882 Allergy status to sulfonamides status: Secondary | ICD-10-CM

## 2022-05-20 DIAGNOSIS — G20 Parkinson's disease: Secondary | ICD-10-CM | POA: Diagnosis present

## 2022-05-20 DIAGNOSIS — I248 Other forms of acute ischemic heart disease: Secondary | ICD-10-CM | POA: Diagnosis present

## 2022-05-20 DIAGNOSIS — Z79899 Other long term (current) drug therapy: Secondary | ICD-10-CM

## 2022-05-20 DIAGNOSIS — Z9841 Cataract extraction status, right eye: Secondary | ICD-10-CM

## 2022-05-20 DIAGNOSIS — Z20822 Contact with and (suspected) exposure to covid-19: Secondary | ICD-10-CM | POA: Diagnosis present

## 2022-05-20 DIAGNOSIS — Z8673 Personal history of transient ischemic attack (TIA), and cerebral infarction without residual deficits: Secondary | ICD-10-CM

## 2022-05-20 DIAGNOSIS — Z79890 Hormone replacement therapy (postmenopausal): Secondary | ICD-10-CM

## 2022-05-20 DIAGNOSIS — B961 Klebsiella pneumoniae [K. pneumoniae] as the cause of diseases classified elsewhere: Secondary | ICD-10-CM | POA: Diagnosis present

## 2022-05-20 DIAGNOSIS — Z1629 Resistance to other single specified antibiotic: Secondary | ICD-10-CM | POA: Diagnosis present

## 2022-05-20 DIAGNOSIS — Z9011 Acquired absence of right breast and nipple: Secondary | ICD-10-CM

## 2022-05-20 DIAGNOSIS — E860 Dehydration: Secondary | ICD-10-CM | POA: Diagnosis present

## 2022-05-20 DIAGNOSIS — K219 Gastro-esophageal reflux disease without esophagitis: Secondary | ICD-10-CM | POA: Diagnosis present

## 2022-05-20 DIAGNOSIS — N3281 Overactive bladder: Secondary | ICD-10-CM | POA: Diagnosis present

## 2022-05-20 DIAGNOSIS — M5134 Other intervertebral disc degeneration, thoracic region: Secondary | ICD-10-CM | POA: Diagnosis present

## 2022-05-20 DIAGNOSIS — Z888 Allergy status to other drugs, medicaments and biological substances status: Secondary | ICD-10-CM

## 2022-05-20 DIAGNOSIS — I5032 Chronic diastolic (congestive) heart failure: Secondary | ICD-10-CM | POA: Diagnosis present

## 2022-05-20 LAB — CBC WITH DIFFERENTIAL/PLATELET
Abs Immature Granulocytes: 0.07 10*3/uL (ref 0.00–0.07)
Basophils Absolute: 0 10*3/uL (ref 0.0–0.1)
Basophils Relative: 0 %
Eosinophils Absolute: 0 10*3/uL (ref 0.0–0.5)
Eosinophils Relative: 0 %
HCT: 43.4 % (ref 36.0–46.0)
Hemoglobin: 14.2 g/dL (ref 12.0–15.0)
Immature Granulocytes: 1 %
Lymphocytes Relative: 11 %
Lymphs Abs: 1.6 10*3/uL (ref 0.7–4.0)
MCH: 30.1 pg (ref 26.0–34.0)
MCHC: 32.7 g/dL (ref 30.0–36.0)
MCV: 92.1 fL (ref 80.0–100.0)
Monocytes Absolute: 0.8 10*3/uL (ref 0.1–1.0)
Monocytes Relative: 6 %
Neutro Abs: 12 10*3/uL — ABNORMAL HIGH (ref 1.7–7.7)
Neutrophils Relative %: 82 %
Platelets: 225 10*3/uL (ref 150–400)
RBC: 4.71 MIL/uL (ref 3.87–5.11)
RDW: 13.4 % (ref 11.5–15.5)
WBC: 14.5 10*3/uL — ABNORMAL HIGH (ref 4.0–10.5)
nRBC: 0 % (ref 0.0–0.2)

## 2022-05-20 LAB — COMPREHENSIVE METABOLIC PANEL
ALT: 19 U/L (ref 0–44)
AST: 56 U/L — ABNORMAL HIGH (ref 15–41)
Albumin: 3.5 g/dL (ref 3.5–5.0)
Alkaline Phosphatase: 99 U/L (ref 38–126)
Anion gap: 9 (ref 5–15)
BUN: 14 mg/dL (ref 8–23)
CO2: 28 mmol/L (ref 22–32)
Calcium: 8.3 mg/dL — ABNORMAL LOW (ref 8.9–10.3)
Chloride: 100 mmol/L (ref 98–111)
Creatinine, Ser: 0.76 mg/dL (ref 0.44–1.00)
GFR, Estimated: >60 mL/min (ref >60)
Glucose, Bld: 125 mg/dL — ABNORMAL HIGH (ref 70–99)
Potassium: 3.7 mmol/L (ref 3.5–5.1)
Sodium: 137 mmol/L (ref 135–145)
Total Bilirubin: 1 mg/dL (ref 0.3–1.2)
Total Protein: 7.5 g/dL (ref 6.5–8.1)

## 2022-05-20 LAB — TSH: TSH: 0.705 u[IU]/mL (ref 0.350–4.500)

## 2022-05-20 LAB — APTT: aPTT: 40 seconds — ABNORMAL HIGH (ref 24–36)

## 2022-05-20 LAB — URINALYSIS, ROUTINE W REFLEX MICROSCOPIC
Bilirubin Urine: NEGATIVE
Glucose, UA: NEGATIVE mg/dL
Ketones, ur: NEGATIVE mg/dL
Nitrite: NEGATIVE
Protein, ur: 30 mg/dL — AB
Specific Gravity, Urine: 1.016 (ref 1.005–1.030)
WBC, UA: >50 WBC/hpf — ABNORMAL HIGH (ref 0–5)
pH: 5 (ref 5.0–8.0)

## 2022-05-20 LAB — MAGNESIUM: Magnesium: 1.6 mg/dL — ABNORMAL LOW (ref 1.7–2.4)

## 2022-05-20 LAB — PROTIME-INR
INR: 2.3 — ABNORMAL HIGH (ref 0.8–1.2)
Prothrombin Time: 24.9 seconds — ABNORMAL HIGH (ref 11.4–15.2)

## 2022-05-20 LAB — LACTIC ACID, PLASMA
Lactic Acid, Venous: 2.2 mmol/L (ref 0.5–1.9)
Lactic Acid, Venous: 1.5 mmol/L (ref 0.5–1.9)

## 2022-05-20 LAB — BRAIN NATRIURETIC PEPTIDE: B Natriuretic Peptide: 304.6 pg/mL — ABNORMAL HIGH (ref 0.0–100.0)

## 2022-05-20 LAB — CK: Total CK: 32 U/L — ABNORMAL LOW (ref 38–234)

## 2022-05-20 LAB — SARS CORONAVIRUS 2 BY RT PCR: SARS Coronavirus 2 by RT PCR: NEGATIVE

## 2022-05-20 LAB — TROPONIN I (HIGH SENSITIVITY)
Troponin I (High Sensitivity): 33 ng/L — ABNORMAL HIGH (ref <18)
Troponin I (High Sensitivity): 34 ng/L — ABNORMAL HIGH (ref <18)

## 2022-05-20 LAB — PHOSPHORUS: Phosphorus: 3.2 mg/dL (ref 2.5–4.6)

## 2022-05-20 LAB — T4, FREE: Free T4: 0.95 ng/dL (ref 0.61–1.12)

## 2022-05-20 LAB — URIC ACID: Uric Acid, Serum: 3.1 mg/dL (ref 2.5–7.1)

## 2022-05-20 MED ORDER — SODIUM CHLORIDE 0.9 % IV BOLUS
500.0000 mL | Freq: Once | INTRAVENOUS | Status: AC
  Administered 2022-05-20: 500 mL via INTRAVENOUS

## 2022-05-20 MED ORDER — DULOXETINE HCL 30 MG PO CPEP
60.0000 mg | ORAL_CAPSULE | Freq: Two times a day (BID) | ORAL | Status: DC
  Administered 2022-05-20 – 2022-05-24 (×8): 60 mg via ORAL
  Filled 2022-05-20 (×8): qty 2

## 2022-05-20 MED ORDER — OXYBUTYNIN CHLORIDE 5 MG PO TABS
5.0000 mg | ORAL_TABLET | Freq: Two times a day (BID) | ORAL | Status: DC
  Administered 2022-05-20 – 2022-05-24 (×8): 5 mg via ORAL
  Filled 2022-05-20 (×8): qty 1

## 2022-05-20 MED ORDER — SENNOSIDES-DOCUSATE SODIUM 8.6-50 MG PO TABS: 1.0000 | ORAL_TABLET | Freq: Every evening | ORAL | Status: DC | PRN

## 2022-05-20 MED ORDER — MELOXICAM 7.5 MG PO TABS
15.0000 mg | ORAL_TABLET | Freq: Every day | ORAL | Status: DC | PRN
  Administered 2022-05-22 – 2022-05-24 (×2): 15 mg via ORAL
  Filled 2022-05-20 (×3): qty 2

## 2022-05-20 MED ORDER — WARFARIN SODIUM 3 MG PO TABS
3.0000 mg | ORAL_TABLET | ORAL | Status: DC
  Administered 2022-05-20 – 2022-05-23 (×2): 3 mg via ORAL
  Filled 2022-05-20 (×3): qty 1

## 2022-05-20 MED ORDER — WARFARIN - PHYSICIAN DOSING INPATIENT
Freq: Every day | Status: DC
  Filled 2022-05-20: qty 1

## 2022-05-20 MED ORDER — TRAZODONE HCL 50 MG PO TABS: 25.0000 mg | ORAL_TABLET | Freq: Every evening | ORAL | Status: DC | PRN

## 2022-05-20 MED ORDER — SODIUM CHLORIDE 0.9 % IV BOLUS: 1000.0000 mL | Freq: Once | INTRAVENOUS | Status: DC

## 2022-05-20 MED ORDER — WARFARIN SODIUM 6 MG PO TABS
6.0000 mg | ORAL_TABLET | ORAL | Status: DC
  Administered 2022-05-21 – 2022-05-22 (×2): 6 mg via ORAL
  Filled 2022-05-20 (×3): qty 1

## 2022-05-20 MED ORDER — ONDANSETRON HCL 4 MG PO TABS: 4.0000 mg | ORAL_TABLET | Freq: Four times a day (QID) | ORAL | Status: DC | PRN

## 2022-05-20 MED ORDER — ONDANSETRON HCL 4 MG/2ML IJ SOLN: 4.0000 mg | Freq: Four times a day (QID) | INTRAMUSCULAR | Status: DC | PRN

## 2022-05-20 MED ORDER — PANTOPRAZOLE SODIUM 40 MG PO TBEC
40.0000 mg | DELAYED_RELEASE_TABLET | Freq: Every day | ORAL | Status: DC
  Administered 2022-05-21 – 2022-05-24 (×4): 40 mg via ORAL
  Filled 2022-05-20 (×4): qty 1

## 2022-05-20 MED ORDER — ACETAMINOPHEN 650 MG RE SUPP: 650.0000 mg | Freq: Four times a day (QID) | RECTAL | Status: DC | PRN

## 2022-05-20 MED ORDER — ACETAMINOPHEN 325 MG PO TABS
650.0000 mg | ORAL_TABLET | Freq: Four times a day (QID) | ORAL | Status: DC | PRN
  Administered 2022-05-21 – 2022-05-23 (×3): 650 mg via ORAL
  Filled 2022-05-20 (×3): qty 2

## 2022-05-20 MED ORDER — METOPROLOL TARTRATE 50 MG PO TABS
100.0000 mg | ORAL_TABLET | Freq: Two times a day (BID) | ORAL | Status: DC
  Administered 2022-05-20 – 2022-05-24 (×8): 100 mg via ORAL
  Filled 2022-05-20 (×8): qty 2

## 2022-05-20 MED ORDER — LEVOTHYROXINE SODIUM 50 MCG PO TABS
50.0000 ug | ORAL_TABLET | Freq: Every day | ORAL | Status: DC
  Administered 2022-05-21 – 2022-05-24 (×4): 50 ug via ORAL
  Filled 2022-05-20 (×4): qty 1

## 2022-05-20 MED ORDER — ALLOPURINOL 100 MG PO TABS
300.0000 mg | ORAL_TABLET | Freq: Every day | ORAL | Status: DC
  Administered 2022-05-21 – 2022-05-24 (×4): 300 mg via ORAL
  Filled 2022-05-20 (×4): qty 3

## 2022-05-20 MED ORDER — SODIUM CHLORIDE 0.9 % IV SOLN
2.0000 g | Freq: Once | INTRAVENOUS | Status: AC
  Administered 2022-05-20: 2 g via INTRAVENOUS
  Filled 2022-05-20: qty 20

## 2022-05-20 MED ORDER — LACTATED RINGERS IV SOLN: INTRAVENOUS | Status: DC

## 2022-05-20 MED ORDER — WARFARIN SODIUM 6 MG PO TABS: 6.0000 mg | ORAL_TABLET | Freq: Every day | ORAL | Status: DC

## 2022-05-20 MED ORDER — CARBIDOPA-LEVODOPA 25-100 MG PO TABS
1.5000 | ORAL_TABLET | Freq: Three times a day (TID) | ORAL | Status: DC
  Administered 2022-05-20 – 2022-05-24 (×12): 1.5 via ORAL
  Filled 2022-05-20 (×11): qty 2
  Filled 2022-05-20: qty 1.5
  Filled 2022-05-20: qty 2

## 2022-05-20 MED ORDER — SODIUM CHLORIDE 0.9 % IV SOLN
1.0000 g | INTRAVENOUS | Status: AC
  Administered 2022-05-21 – 2022-05-22 (×2): 1 g via INTRAVENOUS
  Filled 2022-05-20: qty 1
  Filled 2022-05-20: qty 10

## 2022-05-20 NOTE — ED Provider Notes (Signed)
[facility] Provider Note    Event Date/Time   First MD Initiated Contact with Patient 05/20/22 1015     (approximate)   History   Weakness (Sent from home by home health RN for increased weakness and foul smelling urine/)   HPI  Suzanne Dixon is a 71 y.o. female with paroxysmal A-fib on warfarin, Parkinson's, hypothyroidism who comes in with concerns for increasing weakness and confusion.  Patient lives at home and has a home health nurse that came in and noted patient to be more confused than normal.  There was concern for foul-smelling urine and she was recently treated for UTI.  She reports of increasing weakness and difficulty standing.  She had a low-grade temperature with EMS of 99.  She otherwise is alert and oriented x3 but just states that she does feel little bit more confused than normal.  Denies any known falls.  Denies any chest pain, shortness of breath, abdominal pain.  It sounds like patient was found in her wheelchair and they were concerned that patient did not sleep in her bed last night.  Physical Exam   Triage Vital Signs:   Most recent vital signs: Vitals:   05/20/22 1210  BP: (!) 124/99  Pulse: 94  Resp: (!) 26  Temp: 99.1 F (37.3 C)  SpO2: 94%     General: Awake, no distress.  Patient is alert and oriented x3 CV:  Good peripheral perfusion.  Resp:  Normal effort.  Abd:  No distention.  Abdomen is soft and nontender Other:  Abdomen is soft and nontender.  Patient's speech is normal no obvious deficits noted.  Equal strength in arms and legs.  Patient is able to lift both legs up off the bed   ED Results / Procedures / Treatments   Labs (all labs ordered are listed, but only abnormal results are displayed) Labs Reviewed  URINE CULTURE  SARS CORONAVIRUS 2 BY RT PCR  CBC WITH DIFFERENTIAL/PLATELET  COMPREHENSIVE METABOLIC PANEL  TSH  T4, FREE  URINALYSIS, ROUTINE W REFLEX MICROSCOPIC  TROPONIN I (HIGH SENSITIVITY)      EKG  My interpretation of EKG:  Normal sinus rate of 85 without any ST elevation or T wave inversions, normal intervals  RADIOLOGY I have reviewed the xray personally and interpretted and no PNA    PROCEDURES:  Critical Care performed: No  .1-3 Lead EKG Interpretation  Performed by: Vanessa [place], MD Authorized by: Vanessa [place], MD     Interpretation: normal     ECG rate:  85   ECG rate assessment: normal     Rhythm: sinus rhythm     Ectopy: none     Conduction: normal   .Critical Care  Performed by: Vanessa [place], MD Authorized by: Vanessa [place], MD   Critical care provider statement:    Critical care time (minutes):  30   Critical care was necessary to treat or prevent imminent or life-threatening deterioration of the following conditions:  Sepsis   Critical care was time spent personally by me on the following activities:  Development of treatment plan with patient or surrogate, discussions with consultants, evaluation of patient's response to treatment, examination of patient, ordering and review of laboratory studies, ordering and review of radiographic studies, ordering and performing treatments and interventions, pulse oximetry, re-evaluation of patient's condition and review of old charts    MEDICATIONS ORDERED IN ED: Medications  cefTRIAXone (ROCEPHIN) 2 g in sodium chloride 0.9 %  100 mL IVPB (has no administration in time range)     IMPRESSION / MDM / ASSESSMENT AND PLAN / ED COURSE  I reviewed the triage vital signs and the nursing notes.   Patient's presentation is most consistent with acute presentation with potential threat to life or bodily function.   Differential includes UTI, Electra abnormality, intracranial hemorrhage given patient is on warfarin with new confusion.  Her abdomen is soft and nontender and lower suspicion for acute abdominal pathology.  Will get labs, CT head to further evaluate  CT head was negative.  Chest x-ray was  negative for pneumonia.  COVID test was negative.  UA positive for UTI.  Prior CT imaging did not show any kidney stones the patient denies any history of kidney stones so unlikely to be kidney stone.  Her white count is elevated so she meets sepsis criteria with elevated respiratory rate and elevated white count therefore blood cultures, lactate will be ordered.  Troponin was slightly elevated but downtrending from prior.  Given patient's weakness will discuss hospital team for admission for UTI, sepsis  The patient is on the cardiac monitor to evaluate for evidence of arrhythmia and/or significant heart rate changes.      FINAL CLINICAL IMPRESSION(S) / ED DIAGNOSES   Final diagnoses:  Urinary tract infection without hematuria, site unspecified  Weakness  Acute confusional state  Sepsis, due to unspecified organism, unspecified whether acute organ dysfunction present [facility])     Rx / DC Orders   ED Discharge Orders     None        Note:  This document was prepared using Dragon voice recognition software and may include unintentional dictation errors.   Vanessa [place], MD 05/20/22 1339

## 2022-05-20 NOTE — H&P (Addendum)
History and Physical    Suzanne Dixon:[id number] DOB: April 28, 1951 DOA: 05/20/2022  PCP: Adin Hector, MD Patient coming from: Home - has Suzanne Dixon 3 times weekly.  Chief Complaint: Reported Confusion and Weakness  HPI: Suzanne Dixon is a 71 y.o. female with medical history significant for Parkinson's Disease (on Sinemet), Hypertension, Hyperlipidemia, history of Stroke, Paroxysmal Atrial Fibrillation (on Warfarin), Diastolic Heart Failure,  Significant Chronic Lymphedema, Hypothyroidism, Morbid Obesity, and Impaired Mobility (uses wheelchair) who presents to the ED due to reports of increased weakness and confusion by her home health aid.  Patient was found in sitting in her wheelchair and was not acting like her usual self.  She also reportedly did not sleep in her bed the night before and simply sat in the wheelchair all night.  She has a history of urinary tract infections.  Ms. Sorter reports that she does feel confused and more tired and weak than her normal.  Patient takes Lasix 40 mg daily.  She endorses mild dysuria but no flank pain or abdominal pain.  She denies any chest pain, shortness of breath, nausea, vomiting, abdominal pain, or diarrhea.  EMS reported a low grade fever of 99.  Abdominal exam is benign.  CBC is significant for a WBC count of 14K.  Urinalysis is positive for leukocytes and >50 WBCs.    ED Course:  IV Ceftriaxone 2 g x once  Review of Systems: As per HPI; otherwise review of systems reviewed and negative.   Ambulatory Status: Uses wheelchair at baseline.   Past Medical History:  Diagnosis Date   A-fib ([place])    Anemia    B12 deficiency    Breast cancer ([place]) 03/31/2013   Right - chemo- mastectomy   Breast cancer ([place]) 1991   Rt.- radiation   Complication of anesthesia 01/2009   Recent years with general anesthesia had itching following surgery   DDD (degenerative disc disease), cervical    Depression    Diverticulitis    Dyspnea     Edema of both legs    GERD (gastroesophageal reflux disease)    Gout    H/O mastectomy 2014   per patient   H/O: cesarean section 1987   per patient report   H/O: hysterectomy 1996   per patient   Hydradenitis    Hypertension    Hypothyroidism    Neuropathy    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)    Personal history of chemotherapy    Personal history of radiation therapy    Ruptured cervical disc 2002   per patient    Sleep apnea    cpap machine   Super obese    Tachycardia     Past Surgical History:  Procedure Laterality Date   ABDOMINAL HYSTERECTOMY     BACK SURGERY  2002   plate and 2 screws in neck   BREAST BIOPSY Right 1991,2014   Positive   BREAST CYST ASPIRATION Left    BREAST SURGERY Right 2014   mastectomy   CATARACT EXTRACTION W/PHACO Left 05/17/2019   Procedure: CATARACT EXTRACTION PHACO AND INTRAOCULAR LENS PLACEMENT ([place]);  Surgeon: Eulogio Bear, MD;  Location: [facility] ORS;  Service: Ophthalmology;  Laterality: Left;  [place] 00:28 CDE 2.15 FLUID PACK LOT # [id number]   CATARACT EXTRACTION W/PHACO Right 06/14/2019   Procedure: CATARACT EXTRACTION PHACO AND INTRAOCULAR LENS PLACEMENT (IOC) RIGHT;  Surgeon: Eulogio Bear, MD;  Location: [facility] ORS;  Service:  Ophthalmology;  Laterality: Right;  [place] 00:35.0 CDE 2.37 Fluid Pack Lot #[id number]   CESAREAN SECTION     CHOLECYSTECTOMY  04/09/2012   COLONOSCOPY WITH PROPOFOL N/A 06/22/2015   Procedure: COLONOSCOPY WITH PROPOFOL;  Surgeon: Manya Silvas, MD;  Location: [facility] ENDOSCOPY;  Service: Endoscopy;  Laterality: N/A;   COLONOSCOPY WITH PROPOFOL N/A 05/25/2021   Procedure: COLONOSCOPY WITH PROPOFOL;  Surgeon: Lesly Rubenstein, MD;  Location: [facility] ENDOSCOPY;  Service: Endoscopy;  Laterality: N/A;   ESOPHAGOGASTRODUODENOSCOPY     ESOPHAGOGASTRODUODENOSCOPY N/A 05/25/2021   Procedure: ESOPHAGOGASTRODUODENOSCOPY (EGD);  Surgeon: Lesly Rubenstein, MD;  Location: [facility] ENDOSCOPY;  Service:  Endoscopy;  Laterality: N/A;   EYE SURGERY     LAPAROSCOPIC GASTRIC BYPASS     MASTECTOMY Right    MASTECTOMY MODIFIED RADICAL     mastectomy partial      lumpectomy   NEUROPLASTY / TRANSPOSITION ULNAR NERVE AT ELBOW     OOPHORECTOMY     PORT-A-CATH REMOVAL Left 05/13/2015   Procedure: REMOVAL PORT-A-CATH LEFT CHEST ;  Surgeon: Florene Glen, MD;  Location: [facility] ORS;  Service: General;  Laterality: Left;   REDUCTION MAMMAPLASTY Left    REVERSE SHOULDER ARTHROPLASTY Right 12/03/2020   Procedure: REVERSE SHOULDER ARTHROPLASTY;  Surgeon: Corky Mull, MD;  Location: [facility] ORS;  Service: Orthopedics;  Laterality: Right;   ROUX-EN-Y GASTRIC BYPASS  01/26/2009   SHOULDER ARTHROSCOPY WITH OPEN ROTATOR CUFF REPAIR Right 01/03/2017   Procedure: SHOULDER ARTHROSCOPY WITH OPEN ROTATOR CUFF REPAIR;  Surgeon: Corky Mull, MD;  Location: [facility] ORS;  Service: Orthopedics;  Laterality: Right;   SHOULDER ARTHROSCOPY WITH SUBACROMIAL DECOMPRESSION, ROTATOR CUFF REPAIR AND BICEP TENDON REPAIR Right 01/03/2017   Procedure: SHOULDER ARTHROSCOPY WITH SUBACROMIAL DECOMPRESSION, ROTATOR CUFF REPAIR AND BICEP TENDON REPAIR;  Surgeon: Corky Mull, MD;  Location: [facility] ORS;  Service: Orthopedics;  Laterality: Right;  Limited debridement   SPINE SURGERY      Social History   Socioeconomic History   Marital status: Widowed    Spouse name: Not on file   Number of children: Not on file   Years of education: Not on file   Highest education level: Not on file  Occupational History   Not on file  Tobacco Use   Smoking status: Never   Smokeless tobacco: Never  Vaping Use   Vaping Use: Not on file  Substance and Sexual Activity   Alcohol use: No    Alcohol/week: 0.0 standard drinks of alcohol   Drug use: No   Sexual activity: Not on file  Other Topics Concern   Not on file  Social History Narrative   Not on file   Social Determinants of Health   Financial Resource Strain: Not on file  Food Insecurity:  Not on file  Transportation Needs: Not on file  Physical Activity: Not on file  Stress: Not on file  Social Connections: Not on file  Intimate Partner Violence: Not on file    Allergies  Allergen Reactions   Advair Diskus [Fluticasone-Salmeterol] Other (See Comments)    Joint pain    Alendronate     Muscle pain   Singulair [Montelukast] Other (See Comments)    "muscle pain"   Sulfa Antibiotics Hives   Venlafaxine Other (See Comments)    "altered mental status/tremors"    Family History  Problem Relation Age of Onset   Hypertension Mother    Lymphoma Mother    Cancer Sister        Breast  Breast cancer Sister 17    Prior to Admission medications   Medication Sig Start Date End Date Taking? Authorizing Provider  allopurinol (ZYLOPRIM) 300 MG tablet Take 300 mg by mouth daily.     [provider]  benzonatate (TESSALON) 100 MG capsule Take 100 mg by mouth 3 (three) times daily as needed for cough. 06/04/21   [provider]  Calcium Carb-Cholecalciferol 600-400 MG-UNIT TABS Take 1 tablet by mouth 2 (two) times daily with a meal.    [provider]  carbidopa-levodopa (SINEMET IR) 25-100 MG tablet Take 1.5 tablets by mouth 3 (three) times daily. 06/25/20   [provider]  DULoxetine (CYMBALTA) 60 MG capsule Take 1 capsule by mouth 2 (two) times daily. 02/23/21   [provider]  furosemide (LASIX) 40 MG tablet Hold until outpatient followup due to low blood pressure and patient stayed euvolemic. 07/02/21   Enzo Bi, MD  gabapentin (NEURONTIN) 600 MG tablet Take 600 mg by mouth 4 (four) times daily. 03/09/15   [provider]  Homeopathic Products (PSORIASIS/ECZEMA RELIEF EX) Apply 1 application topically daily as needed (eczema).    [provider]  hydrocortisone 2.5 % cream Apply topically 2 (two) times daily as needed. Home med. Patient not taking: Reported on 04/08/2022 07/02/21   Enzo Bi, MD  levothyroxine  (SYNTHROID, LEVOTHROID) 50 MCG tablet Take 50 mcg by mouth daily before breakfast.    [provider]  Magnesium 400 MG CAPS Take 400 mg by mouth daily.    [provider]  meloxicam (MOBIC) 15 MG tablet Take 1 tablet (15 mg total) by mouth daily as needed for pain. 07/02/21   Enzo Bi, MD  metoprolol tartrate (LOPRESSOR) 100 MG tablet Take 1 tablet (100 mg total) by mouth 2 (two) times daily. Reduced from 200 mg. 07/02/21   Enzo Bi, MD  Multiple Vitamins tablet Take 2 tablets by mouth daily.     [provider]  omeprazole (PRILOSEC) 20 MG capsule Take 20 mg by mouth daily.     [provider]  oxybutynin (DITROPAN) 5 MG tablet Take 5 mg by mouth 2 (two) times daily.    [provider]  oxybutynin (DITROPAN) 5 MG tablet Take 1 tablet by mouth 2 (two) times daily. 10/04/21   [provider]  potassium chloride (KLOR-CON) 10 MEQ tablet Take 10 mEq by mouth 3 (three) times daily. Patient not taking: Reported on 04/08/2022 09/28/21   [provider]  triamcinolone (KENALOG) 0.025 % cream Apply 1 application topically 2 (two) times daily as needed. Home med. 07/02/21   Enzo Bi, MD  vitamin B-12 (CYANOCOBALAMIN) 1000 MCG tablet Take 1,000 mcg by mouth daily.    [provider]  warfarin (COUMADIN) 6 MG tablet Take 1 tablet (6 mg total) by mouth daily at 4 PM. Reduced from 7 mg. 07/02/21   Enzo Bi, MD    Physical Exam: Vitals:   05/20/22 1210  BP: (!) 124/99  Pulse: 94  Resp: (!) 26  Temp: 99.1 F (37.3 C)  TempSrc: Oral  SpO2: 94%     Examination: General exam: alert and oriented, mental status intact, appears a little fatigued HEENT: NCAT, PERRL Respiratory system: CTAB no WRR Cardiovascular system: Did not appreciate a murmur, regular, No JVD. Gastrointestinal system: No flank pain, Abdomen soft, NT,ND, BS+. Nervous System: No focal deficits.  Sensation is intact.  Peripheral neuropathy of distal  extremities. Extremities: 2(+) edema of RLE, 4(+) edema or LLE.  Pulses  intact. Skin: No open wounds or rashes. MSK: Physical Deconditioning - patient is wheelchari bound., limited ROM of right knee  Radiological Exams on Admission: Independently reviewed - see discussion in A/P where applicable  CT HEAD WO CONTRAST (5MM)  Result Date: 05/20/2022 CLINICAL DATA:  Headache, new or worsening. EXAM: CT HEAD WITHOUT CONTRAST TECHNIQUE: Contiguous axial images were obtained from the base of the skull through the vertex without intravenous contrast. RADIATION DOSE REDUCTION: This exam was performed according to the departmental dose-optimization program which includes automated exposure control, adjustment of the mA and/or kV according to patient size and/or use of iterative reconstruction technique. COMPARISON:  CT brain 02/16/2022 FINDINGS: Brain: The ventricles are normal in size and configuration. The basilar cisterns are patent. No mass, mass effect, or midline shift. No acute intracranial hemorrhage is seen. No abnormal extra-axial fluid collection. Preservation of the normal cortical gray-white interface without CT evidence of an acute major vascular territorial cortical based infarction. Vascular: No hyperdense vessel or unexpected calcification. Skull: There is again a well-circumscribed bony protuberance extending outward from the left occipital calvarium, likely reflecting an osteoma (axial series 3, image 15). Normal. Negative for fracture or focal lesion. Sinuses/Orbits: Status post bilateral ocular lens replacements. The visualized paranasal sinuses and mastoid air cells are clear. Other: None. IMPRESSION: No acute intracranial process. Electronically Signed   By: Yvonne Kendall M.D.   On: 05/20/2022 10:57   DG Chest Portable 1 View  Result Date: 05/20/2022 CLINICAL DATA:  Weakness EXAM: PORTABLE CHEST 1 VIEW COMPARISON:  Chest radiograph 06/19/2021 FINDINGS: Low lung volume exam. Stable cardiac  and mediastinal contours. Basilar atelectasis. No pleural effusion or pneumothorax. IMPRESSION: Low lung volume exam.  Basilar atelectasis. Electronically Signed   By: Lovey Newcomer M.D.   On: 05/20/2022 10:48    EKG: Independently reviewed.  Normal sinus rate and rhythm.  No acute ST or T wave changes.  QTC is 477 ms.  Labs on Admission: I have personally reviewed the available labs and imaging studies at the time of the admission.  Pertinent labs:   Lab Results  Component Value Date   WBC 14.5 (H) 05/20/2022   HGB 14.2 05/20/2022   HCT 43.4 05/20/2022   MCV 92.1 05/20/2022   PLT 225 56/31/4970   Last metabolic panel Lab Results  Component Value Date   GLUCOSE 125 (H) 05/20/2022   NA 137 05/20/2022   K 3.7 05/20/2022   CL 100 05/20/2022   CO2 28 05/20/2022   BUN 14 05/20/2022   CREATININE 0.76 05/20/2022   GFRNONAA >60 05/20/2022   CALCIUM 8.3 (L) 05/20/2022   PHOS 3.1 06/23/2021   PROT 7.5 05/20/2022   ALBUMIN 3.5 05/20/2022   BILITOT 1.0 05/20/2022   ALKPHOS 99 05/20/2022   AST 56 (H) 05/20/2022   ALT 19 05/20/2022   ANIONGAP 9 05/20/2022    Assessment/Plan Acute Metabolic Encephalopathy in the setting of Parkinson's Disease: Home Health aid workers report an increased amount of confusion and weakness.  This is due to a mild UTI and mild dehydration. - Start antibiotics.  Follow up on blood and urine cultures. - Hold home Lasix and start IV fluids. - Continue home SNRI.   - Hold Gabapentin. - Continue home Sinemet.  Lactic Acidosis: Lactate is 2.2 mmol/L. - Bolus NS followed by LR 100 CC/hr x 10 hours.  We will try to be gentle with fluids given significant lymphedema. - Trend Lactate.  Chronic Lower Extremity Weakness due to Chronic Lymphedema, Peripheral Neuropathy, Arthritis  and Thoracic DDD: Patient uses electric wheelchair at baseline.  Jette Dixon report patient's weakness is worse than her baseline. - Consult PT/OT in am.  Acute Uncomplicated Cystitis:  Patient endorses urinary symptoms.  WBC is 14K with 82% neutrophils.  UA shows many bacteria, large leukocytes, and >50 WBCs.   - S/P IV Ceftriaxone 2 g x once.  Start IV Ceftriaxone 1 g daily.   - Follow up on urine cultures.  Hypertension: BP is stable. - Continue home BP meds.  Hyperlipidemia: - Recommend Statin if CK is wnl.  Chronic Atrial Fibrillation: - HR is 94 beats/min.  Continue Lopressor 100 mg BID for rate control. - INR is 2.3.  Platelets are 225K.  Continue Warfarin 6 mg daily for anticoagulation.  Grade 1 Diastolic Heart Failure: - Change Lasix to as needed for now to avoid dehydration.  Chronic Lymphedema: - Keep legs elevated and wear compressive stockings..    Hypothyroidism: - TSH is wnl. - Continue home Synthroid 50 mcg daily.  Osteoarthritis s/p 2022 Right Shoulder Arthroplasty/ Major Depressive Disorder:  - Continue home Duloxetine 60 mg BID and Meloxicam 15 mg as needed.  Hold Gabapentin 600 mg 4 times daily.  GERD: - PPI  Overactive Bladder: - Oxybutynin.  Chronic Gout: - Check uric acid. - Continue home Allopurinol.  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  Consider GLP-1.  OSA: - Wear CPAP nightly.  History of Breast Cancer s/p Right Breast Mastectomy Cholecystectomy Hysterectomy History of Cervical Spine Laminotomy  Note: This patient has been tested and is negative for the novel coronavirus COVID-19.  Level of care:  DVT prophylaxis: Lovenox Code Status: Full - confirmed with patient/family Family Communication: None present; I spoke with the patient's husband by telephone at the time of admission. Disposition Plan:  The patient is from: home and has home health Dixon 3x weekly  Anticipated d/c is to: home and continue home health  Anticipated d/c date will depend on clinical response to treatment, but possibly as early as tomorrow if she has excellent response to treatment and confusion/weakness  have resolved.  Patient is currently: acutely ill Consults called: None Admission status:  Observation   George Hugh MD [facility]   How to contact the [facility] Attending or Consulting provider [place] or covering provider during after hours [place], for this patient?  Check the care team in [facility] and look for a) attending/consulting [facility] provider listed and b) the [facility] team listed Log into [email or web address] and use [place]'s universal password to access. If you do not have the password, please contact the hospital operator. Locate the [facility] provider you are looking for under [facility] and page to a number that you can be directly reached. If you still have difficulty reaching the provider, please page the [facility] (Director on Call) for the Hospitalists listed on amion for assistance.   05/20/2022, 1:26 PM

## 2022-05-20 NOTE — ED Notes (Signed)
Pt reports she lives alone and has a Dietitian come help her three days a week.

## 2022-05-20 NOTE — ED Notes (Addendum)
Pt straight cath'd for urine sample. Peri care provided before and after. Pts bed lines changed and pt placed on blue pad with purwic in place post cath. Foul smell noted.

## 2022-05-20 NOTE — ED Notes (Signed)
Date and time results received: 05/20/22 1432 (use smartphrase ".now" to insert current time)  Test: Lactic Acid Critical Value: 2.2  Name of Provider Notified: Dr. Jari Pigg

## 2022-05-21 DIAGNOSIS — N390 Urinary tract infection, site not specified: Secondary | ICD-10-CM | POA: Diagnosis present

## 2022-05-21 DIAGNOSIS — Z20822 Contact with and (suspected) exposure to covid-19: Secondary | ICD-10-CM | POA: Diagnosis present

## 2022-05-21 DIAGNOSIS — G4733 Obstructive sleep apnea (adult) (pediatric): Secondary | ICD-10-CM | POA: Diagnosis present

## 2022-05-21 DIAGNOSIS — E872 Acidosis, unspecified: Secondary | ICD-10-CM | POA: Diagnosis present

## 2022-05-21 DIAGNOSIS — E860 Dehydration: Secondary | ICD-10-CM | POA: Diagnosis present

## 2022-05-21 DIAGNOSIS — I110 Hypertensive heart disease with heart failure: Secondary | ICD-10-CM | POA: Diagnosis present

## 2022-05-21 DIAGNOSIS — E669 Obesity, unspecified: Secondary | ICD-10-CM | POA: Diagnosis present

## 2022-05-21 DIAGNOSIS — F329 Major depressive disorder, single episode, unspecified: Secondary | ICD-10-CM | POA: Diagnosis present

## 2022-05-21 DIAGNOSIS — F05 Delirium due to known physiological condition: Secondary | ICD-10-CM | POA: Diagnosis present

## 2022-05-21 DIAGNOSIS — I5032 Chronic diastolic (congestive) heart failure: Secondary | ICD-10-CM | POA: Diagnosis present

## 2022-05-21 DIAGNOSIS — I248 Other forms of acute ischemic heart disease: Secondary | ICD-10-CM | POA: Diagnosis present

## 2022-05-21 DIAGNOSIS — K219 Gastro-esophageal reflux disease without esophagitis: Secondary | ICD-10-CM | POA: Diagnosis present

## 2022-05-21 DIAGNOSIS — I890 Lymphedema, not elsewhere classified: Secondary | ICD-10-CM | POA: Diagnosis present

## 2022-05-21 DIAGNOSIS — G20 Parkinson's disease: Secondary | ICD-10-CM | POA: Diagnosis present

## 2022-05-21 DIAGNOSIS — G9341 Metabolic encephalopathy: Secondary | ICD-10-CM | POA: Diagnosis present

## 2022-05-21 DIAGNOSIS — M5134 Other intervertebral disc degeneration, thoracic region: Secondary | ICD-10-CM | POA: Diagnosis present

## 2022-05-21 DIAGNOSIS — E785 Hyperlipidemia, unspecified: Secondary | ICD-10-CM | POA: Diagnosis present

## 2022-05-21 DIAGNOSIS — I4821 Permanent atrial fibrillation: Secondary | ICD-10-CM | POA: Diagnosis present

## 2022-05-21 DIAGNOSIS — M5030 Other cervical disc degeneration, unspecified cervical region: Secondary | ICD-10-CM | POA: Diagnosis present

## 2022-05-21 DIAGNOSIS — Z1629 Resistance to other single specified antibiotic: Secondary | ICD-10-CM | POA: Diagnosis present

## 2022-05-21 DIAGNOSIS — G4730 Sleep apnea, unspecified: Secondary | ICD-10-CM | POA: Diagnosis present

## 2022-05-21 DIAGNOSIS — Z961 Presence of intraocular lens: Secondary | ICD-10-CM | POA: Diagnosis present

## 2022-05-21 DIAGNOSIS — E039 Hypothyroidism, unspecified: Secondary | ICD-10-CM | POA: Diagnosis present

## 2022-05-21 DIAGNOSIS — B961 Klebsiella pneumoniae [K. pneumoniae] as the cause of diseases classified elsewhere: Secondary | ICD-10-CM | POA: Diagnosis present

## 2022-05-21 LAB — BASIC METABOLIC PANEL
Anion gap: 3 — ABNORMAL LOW (ref 5–15)
BUN: 12 mg/dL (ref 8–23)
CO2: 26 mmol/L (ref 22–32)
Calcium: 7.8 mg/dL — ABNORMAL LOW (ref 8.9–10.3)
Chloride: 106 mmol/L (ref 98–111)
Creatinine, Ser: 0.57 mg/dL (ref 0.44–1.00)
GFR, Estimated: >60 mL/min (ref >60)
Glucose, Bld: 97 mg/dL (ref 70–99)
Potassium: 3.5 mmol/L (ref 3.5–5.1)
Sodium: 135 mmol/L (ref 135–145)

## 2022-05-21 LAB — CBC
HCT: 37.4 % (ref 36.0–46.0)
Hemoglobin: 12.7 g/dL (ref 12.0–15.0)
MCH: 30.2 pg (ref 26.0–34.0)
MCHC: 34 g/dL (ref 30.0–36.0)
MCV: 88.8 fL (ref 80.0–100.0)
Platelets: 197 10*3/uL (ref 150–400)
RBC: 4.21 MIL/uL (ref 3.87–5.11)
RDW: 13.3 % (ref 11.5–15.5)
WBC: 13.6 10*3/uL — ABNORMAL HIGH (ref 4.0–10.5)
nRBC: 0 % (ref 0.0–0.2)

## 2022-05-21 LAB — BLOOD CULTURE ID PANEL (REFLEXED) - BCID2

## 2022-05-21 LAB — MRSA NEXT GEN BY PCR, NASAL: MRSA by PCR Next Gen: DETECTED — AB

## 2022-05-21 LAB — PROTIME-INR
INR: 2.3 — ABNORMAL HIGH (ref 0.8–1.2)
Prothrombin Time: 25.2 seconds — ABNORMAL HIGH (ref 11.4–15.2)

## 2022-05-21 MED ORDER — MAGNESIUM SULFATE 2 GM/50ML IV SOLN
2.0000 g | Freq: Once | INTRAVENOUS | Status: AC
Start: 2022-05-21 — End: 2022-05-21
  Administered 2022-05-21: 2 g via INTRAVENOUS
  Filled 2022-05-21: qty 50

## 2022-05-21 MED ORDER — VANCOMYCIN HCL IN DEXTROSE 1-5 GM/200ML-% IV SOLN
1000.0000 mg | Freq: Once | INTRAVENOUS | Status: AC
  Administered 2022-05-21: 1000 mg via INTRAVENOUS
  Filled 2022-05-21: qty 200

## 2022-05-21 MED ORDER — VANCOMYCIN HCL 1250 MG/250ML IV SOLN
1250.0000 mg | INTRAVENOUS | Status: DC
  Administered 2022-05-22: 1250 mg via INTRAVENOUS
  Filled 2022-05-21: qty 250

## 2022-05-21 NOTE — Evaluation (Signed)
Occupational Therapy Evaluation Patient Details Name: Suzanne Dixon MRN: [id number] DOB: [birth year] Today's Date: 05/21/2022   History of Present Illness Pt. is a 71 y.o. female with medical history significant for Parkinson's Disease (on Sinemet), Hypertension, Hyperlipidemia, history of Stroke, Paroxysmal Atrial Fibrillation (on Warfarin), Diastolic Heart Failure,  Significant Chronic Lymphedema, Hypothyroidism, Morbid Obesity, and Impaired Mobility (uses wheelchair) who presents to the ED due to reports of increased weakness and confusion by her home health aid.   Clinical Impression   Pt. presents with weakness, 7/10 pain, and limited functional mobility which hinder her ability to complete ADL, and IADL tasks. Pt. resides at home alone. Pt. has personal care aides 4 hours a day 3x's a week who assist the pt. With grocery shopping, cooking, cleaning, and self-care. She was receiving HHPT 1 day a week. Pt. uses a w/c, and walker for mobility within the home. Pt. has 2 daughters who reside in [place]. Pt. Requires MinA , and ModA LE ADLs secondary to 7/10 intermittent knee pain which is limiting functional performance. Pt. Education was provided about postioning for comfort. Pt. Could benefit from OT services for ADL training, A/E training, and pt. Education about home modification, and DME. Pt. would benefit from SNF level of care upon discharge, with follow-up OT services.    Recommendations for follow up therapy are one component of a multi-disciplinary discharge planning process, led by the attending physician.  Recommendations may be updated based on patient status, additional functional criteria and insurance authorization.   Follow Up Recommendations  Skilled nursing-short term rehab (<3 hours/day)    Assistance Recommended at Discharge    Patient can return home with the following A little help with bathing/dressing/bathroom;A little help with walking and/or transfers;Assistance with  cooking/housework;Direct supervision/assist for medications management;Assist for transportation    Functional Status Assessment  Patient has had a recent decline in their functional status and demonstrates the ability to make significant improvements in function in a reasonable and predictable amount of time.  Equipment Recommendations       Recommendations for Other Services       Precautions / Restrictions Precautions Precautions: Fall Restrictions Weight Bearing Restrictions: No      Mobility Bed Mobility   Bed Mobility: Supine to Sit     Supine to sit: Modified independent (Device/Increase time), HOB elevated          Transfers   Equipment used: Rolling walker (2 wheels) Transfers: Sit to/from Stand Sit to Stand: Supervision           General transfer comment: Mobility per PT.      Balance                                           ADL either performed or assessed with clinical judgement   ADL Overall ADL's : Needs assistance/impaired Eating/Feeding: Independent;Set up   Grooming: Set up;Minimal assistance           Upper Body Dressing : Minimal assistance   Lower Body Dressing: Moderate assistance;Minimal assistance Lower Body Dressing Details (indicate cue type and reason): secondary to left knee pain                     Vision Baseline Vision/History: 0 No visual deficits Patient Visual Report: No change from baseline       Perception     Praxis  Pertinent Vitals/Pain Pain Assessment Pain Assessment: 0-10 Pain Score: 7  Pain Location: Left knee Pain Descriptors / Indicators: Moaning, Grimacing, Guarding Pain Intervention(s): Limited activity within patient's tolerance, Monitored during session, Repositioned     Hand Dominance Left   Extremity/Trunk Assessment Upper Extremity Assessment Upper Extremity Assessment: Defer to OT evaluation   Lower Extremity Assessment Lower Extremity Assessment:  Generalized weakness   Cervical / Trunk Assessment Cervical / Trunk Assessment: Normal   Communication Communication Communication: No difficulties   Cognition Arousal/Alertness: Awake/alert Behavior During Therapy: WFL for tasks assessed/performed Overall Cognitive Status: Within Functional Limits for tasks assessed                                       General Comments       Exercises     Shoulder Instructions      Home Living Family/patient expects to be discharged to:: Private residence Living Arrangements: Alone Available Help at Discharge: Family;Available PRN/intermittently;Personal care attendant Type of Home: House Home Access: Ramped entrance     Home Layout: One level     Bathroom Shower/Tub: Walk-in shower;Sponge bathes at baseline   Bathroom Toilet: Handicapped height     Home Equipment: Shower seat - built in;Grab bars - toilet;Grab bars - tub/shower;BSC/3in1;Rollator (4 wheels);Rolling Walker (2 wheels);Cane - quad;Wheelchair - manual;Hospital bed          Prior Functioning/Environment Prior Level of Function : Needs assist       Physical Assist : ADLs (physical)   ADLs (physical): Bathing;Dressing;IADLs;Grooming Mobility Comments: Ambulates with W/C  at home independently. Uses RW to walk on ramp with PT. A year ago she used a SPC. ADLs Comments: Pt. has home health 4 hours x 3 days a week to do laundry, grocery shopping, cooking, cleaning, helps with dressing, bathing. Has PT 1x a week. She uses the [facility] herself. Pt. heats up meals in the microwave on the days the the aides are off.        OT Problem List: Decreased strength;Decreased knowledge of use of DME or AE;Decreased activity tolerance;Pain      OT Treatment/Interventions: Self-care/ADL training;Therapeutic exercise;Energy conservation;DME and/or AE instruction    OT Goals(Current goals can be found in the care plan section) Acute Rehab OT Goals Patient Stated Goal:  To return home OT Goal Formulation: With patient Time For Goal Achievement: 06/11/22 Potential to Achieve Goals: Good  OT Frequency: Min 2X/week    Co-evaluation              AM-PAC OT "6 Clicks" Daily Activity     Outcome Measure Help from another person eating meals?: None Help from another person taking care of personal grooming?: A Little Help from another person toileting, which includes using toliet, bedpan, or urinal?: A Little Help from another person bathing (including washing, rinsing, drying)?: A Little Help from another person to put on and taking off regular upper body clothing?: A Little Help from another person to put on and taking off regular lower body clothing?: A Lot (2/2 7/10 left knee pain) 6 Click Score: 18   End of Session Equipment Utilized During Treatment: Gait belt  Activity Tolerance: Patient tolerated treatment well Patient left: in bed  OT Visit Diagnosis: Unsteadiness on feet (R26.81)                Time: 2992-4268 OT Time Calculation (min): 19 min Charges:  OT General  Charges $OT Visit: 1 Visit OT Evaluation $OT Eval Moderate Complexity: 1 Mod  Harrel Carina, MS, OTR/L   Harrel Carina 05/21/2022, 1:19 PM

## 2022-05-21 NOTE — Consult Note (Signed)
Pharmacy Antibiotic Note  Suzanne Dixon is a 71 y.o. female who presented to [facility] on 05/20/2022 with a chief complaint of acute metabolic encephalopathy ISO parkinson's disease with further c/o urinary symptoms. Pt w/ acute uncomplicated cystitis. WBC 14k, & UA with WBC >50, many bac, large LE.    9/2 - BCID update GPCs in 2 of 4 vials (aerobic) with Staphylococcus species (I.e. not Aureus, epidermidis, & lugdunensis). Pharmacy has been consulted for Vancomycin dosing and plan to repeat blood cultures.  Plan: Vancomycin 1g + 1g to complete loading dose; followed by vancomycin '1250mg'$  IV q24h Goal AUC 400-600  Est AUC: 517; Cmax: 36.7; Cmin: 12.1 SCr 0.8 (rounded from 0.57) ; IBW; Vd 0.5 (BMI 37)  F/u MRSA PCR ordered & repeat blood cultures.   Height: '5\' 4"'$  (162.6 cm) Weight: 98.1 kg (216 lb 4.3 oz) IBW/kg (Calculated) : 54.7  Temp (24hrs), Avg:98.5 F (36.9 C), Min:97.4 F (36.3 C), Max:99.8 F (37.7 C)  Recent Labs  Lab 05/20/22 1123 05/20/22 1351 05/20/22 1818 05/21/22 0450  WBC 14.5*  --   --  13.6*  CREATININE 0.76  --   --  0.57  LATICACIDVEN  --  2.2* 1.5  --     Estimated Creatinine Clearance: 71.3 mL/min (by C-G formula based on SCr of 0.57 mg/dL).    Allergies  Allergen Reactions   Advair Diskus [Fluticasone-Salmeterol] Other (See Comments)    Joint pain    Alendronate     Muscle pain   Singulair [Montelukast] Other (See Comments)    "muscle pain"   Sulfa Antibiotics Hives   Venlafaxine Other (See Comments)    "altered mental status/tremors"    Antimicrobials this admission: VAN (9/2 >>  CRO (9/1 >>   Dose adjustments this admission: CTM and adjust PRN. Currently renal function improved creatinine 0.76>0.57.  Microbiology results: 9/02 BCx: (repeat) pending 9/01 BCx: BCID 2 of 4 vials: GPC > staph spp 9/01 UCx: sent  9/01 Covid: negative  9/02 MRSA PCR: pending  Thank you for allowing pharmacy to be a part of this patient's  care.  Shanon Brow Mailin Coglianese 05/21/2022 10:51 AM

## 2022-05-21 NOTE — Evaluation (Signed)
Physical Therapy Evaluation Patient Details Name: Suzanne Dixon MRN: [id number] DOB: 25-Apr-1951 Today's Date: 05/21/2022  History of Present Illness  Suzanne Dixon is a 71 y.o. female with medical history significant for Parkinson's Disease (on Sinemet), Hypertension, Hyperlipidemia, history of Stroke, Paroxysmal Atrial Fibrillation (on Warfarin), Diastolic Heart Failure,  Significant Chronic Lymphedema, Hypothyroidism, Morbid Obesity, and Impaired Mobility (uses wheelchair) who presents to the ED due to reports of increased weakness and confusion by her home health aid.   Clinical Impression  Patient received reclining in bed alert and oriented x4 (except day of month) but reports feeling more confused than usual and does seem to lack awareness of times of details of her situation. She reports she lives at home alone where she has an aide that helps her for 4 hours 3x a week and PT once a week on Thursdays. She states that prior to hospitalization, she independently transferred and used her W/C for mobility in the home and was working with PT on ambulating with her RW on the ramp to prepare for return to driving. She states she took sponge baths independently, but needed help with bathing in the shower. Upon PT eval, patient required mod I with extra time and effort to perform supine to sit in the bed and needed standby assist for sit <> stand transfers from elevated bed and armed chair using RW. She was not strong enough to transfer from standard height bed. She also ambulated approx 5 feet with RW with SBA and cuing to stay close to the AD. She mostly used good body mechanics and RW placement during transfers and ambulation, but sometimes only remembered better mechanics after failed attempt without using proper body placement. She also neglected to try certain things like reaching for the hospital bed controls to elevate her bed or remember how to call the nurse. Patient appears to have experienced a  decline in function and would benefit from short term rehab prior to returning home. However, she was adamant that she does not want to go to short term rehab and would like to go home instead. She would likely be able to return home with increased assistance (daily) from home health. Patient would benefit from skilled physical therapy to address impairments and functional limitations (see PT Problem List below) to work towards stated goals and return to PLOF or maximal functional independence.       Recommendations for follow up therapy are one component of a multi-disciplinary discharge planning process, led by the attending physician.  Recommendations may be updated based on patient status, additional functional criteria and insurance authorization.  Follow Up Recommendations Skilled nursing-short term rehab (<3 hours/day) Can patient physically be transported by private vehicle: Yes    Assistance Recommended at Discharge Intermittent Supervision/Assistance  Patient can return home with the following  A little help with walking and/or transfers;A lot of help with bathing/dressing/bathroom;Assistance with cooking/housework;Assist for transportation;Help with stairs or ramp for entrance;Direct supervision/assist for medications management    Equipment Recommendations None recommended by PT  Recommendations for Other Services       Functional Status Assessment Patient has had a recent decline in their functional status and demonstrates the ability to make significant improvements in function in a reasonable and predictable amount of time.     Precautions / Restrictions Precautions Precautions: Fall Restrictions Weight Bearing Restrictions: No      Mobility  Bed Mobility Overal bed mobility: Needs Assistance Bed Mobility: Supine to Sit     Supine  to sit: Modified independent (Device/Increase time), HOB elevated     General bed mobility comments: Patient required increased  time/effort to come supine to sit.    Transfers Overall transfer level: Needs assistance Equipment used: Rolling walker (2 wheels) Transfers: Sit to/from Stand Sit to Stand: Supervision           General transfer comment: Patient transfered from elevated bed to armed chair using RW with SBA for safety and minimal cuing for body and hand placement. Patient needed extra time to remember where to place her hands and how to position body. She had pain in B LE and was unable to stand from bed at standard height due to LE weakness. Patient also completed sit<>stand to/from chair with SBA.    Ambulation/Gait Ambulation/Gait assistance: Supervision Gait Distance (Feet): 5 Feet Assistive device: Rolling walker (2 wheels) Gait Pattern/deviations: Step-to pattern, Decreased step length - right, Decreased step length - left, Trunk flexed Gait velocity: very slow     General Gait Details: Patient ambulated approx 5 feet forwards with RW and close chair follow. Patient had difficulty weight bearing on L LE due to pain and needed occasional cuing to remember to position body safely at RW.  Stairs            Wheelchair Mobility    Modified Rankin (Stroke Patients Only)       Balance Overall balance assessment: Needs assistance Sitting-balance support: No upper extremity supported, Feet supported Sitting balance-Leahy Scale: Good Sitting balance - Comments: steady sitting at edge of bed     Standing balance-Leahy Scale: Poor Standing balance comment: Dependent on B UE support during ambulation. Can take one hand off support intermittantly during transfers.                             Pertinent Vitals/Pain Pain Assessment Pain Assessment: 0-10 Pain Score: 6  Pain Location: hands and knees Pain Descriptors / Indicators: Moaning, Grimacing, Guarding Pain Intervention(s): Limited activity within patient's tolerance, Monitored during session, Repositioned    Home  Living Family/patient expects to be discharged to:: Private residence Living Arrangements: Alone   Type of Home: House Home Access: Ramped entrance       Home Layout: One level Home Equipment: Shower seat - built in;Grab bars - toilet;Grab bars - tub/shower;BSC/3in1;Rollator (4 wheels);Rolling Walker (2 wheels);Cane - quad;Wheelchair - manual;Hospital bed      Prior Function Prior Level of Function : Needs assist       Physical Assist : ADLs (physical)   ADLs (physical): Bathing;Dressing;IADLs;Grooming Mobility Comments: Ambulates with W/C  at home independently. Uses RW to walk on ramp with PT. A year ago she used a SPC. ADLs Comments: has home health 4 hours x 3 days a week to do washing, cooking,  clenaing, helps with dressing, bathing. Has PT 1x a week. She uses the [facility] herself.     Hand Dominance   Dominant Hand: Left    Extremity/Trunk Assessment   Upper Extremity Assessment Upper Extremity Assessment: Generalized weakness    Lower Extremity Assessment Lower Extremity Assessment: Generalized weakness    Cervical / Trunk Assessment Cervical / Trunk Assessment: Normal  Communication   Communication: No difficulties  Cognition Arousal/Alertness: Awake/alert Behavior During Therapy: WFL for tasks assessed/performed Overall Cognitive Status: Within Functional Limits for tasks assessed  General Comments: Patient states she is feeling confused, but she was A&Ox4. Does have some difficulty getting her thoughts out at times.        General Comments      Exercises Other Exercises Other Exercises: educated patient about role of PT in acute care setting   Assessment/Plan    PT Assessment Patient needs continued PT services  PT Problem List Decreased strength;Pain;Decreased cognition;Decreased activity tolerance;Decreased balance;Decreased mobility;Obesity;Decreased knowledge of use of DME       PT Treatment  Interventions DME instruction;Balance training;Gait training;Neuromuscular re-education;Cognitive remediation;Functional mobility training;Therapeutic activities;Therapeutic exercise;Patient/family education;Wheelchair mobility training    PT Goals (Current goals can be found in the Care Plan section)  Acute Rehab PT Goals Patient Stated Goal: go home and not go to rehab PT Goal Formulation: With patient Time For Goal Achievement: 06/04/22 Potential to Achieve Goals: Good    Frequency Min 2X/week     Co-evaluation               AM-PAC PT "6 Clicks" Mobility  Outcome Measure Help needed turning from your back to your side while in a flat bed without using bedrails?: A Little Help needed moving from lying on your back to sitting on the side of a flat bed without using bedrails?: A Little Help needed moving to and from a bed to a chair (including a wheelchair)?: A Little Help needed standing up from a chair using your arms (e.g., wheelchair or bedside chair)?: A Little Help needed to walk in hospital room?: A Little Help needed climbing 3-5 steps with a railing? : Total 6 Click Score: 16    End of Session Equipment Utilized During Treatment: Gait belt Activity Tolerance: Patient tolerated treatment well;Patient limited by pain;Patient limited by fatigue Patient left: in chair;with call bell/phone within reach;with chair alarm set Nurse Communication: Mobility status PT Visit Diagnosis: Unsteadiness on feet (R26.81);Difficulty in walking, not elsewhere classified (R26.2);Muscle weakness (generalized) (M62.81)    Time: 7322-0254 PT Time Calculation (min) (ACUTE ONLY): 47 min   Charges:   PT Evaluation $PT Eval Moderate Complexity: 1 Mod PT Treatments $Therapeutic Activity: 8-22 mins        Everlean Alstrom. Graylon Good, PT, DPT 05/21/22, 10:17 AM

## 2022-05-21 NOTE — Progress Notes (Signed)
Per Dr Patel, d.c tele monitoring 

## 2022-05-21 NOTE — Hospital Course (Signed)
Suzanne Dixon is a 71 y.o. female with medical history significant for Parkinson's Disease (on Sinemet), Hypertension, Hyperlipidemia, history of Stroke, Paroxysmal Atrial Fibrillation (on Warfarin), Diastolic Heart Failure,  Significant Chronic Lymphedema, Hypothyroidism, Morbid Obesity, and Impaired Mobility (uses wheelchair) who presents to the ED due to reports of increased weakness and confusion by her home health aid found to have UTI and dehydration.   9/2: 1/4 BC+, staph(I.e. not Aureus, epidermidis, & lugdunensis), on vanc and repeat BC pending. Encephalopathy improved.

## 2022-05-21 NOTE — Consult Note (Addendum)
PHARMACY - PHYSICIAN COMMUNICATION CRITICAL VALUE ALERT - BLOOD CULTURE IDENTIFICATION (BCID)  Suzanne Dixon is an 71 y.o. female who presented to [place] on 05/20/2022 with a chief complaint of acute metabolic encephalopathy ISO parkinson's disease with further c/o urinary symptoms. Pt w/ acute uncomplicated cystitis. WBC 14k, & UA WBC >50, many bac, large LE.   Assessment: BCID identified GPCs in 1 of 4 vials (aerobic) with Staphylococcus species (I.e. not Aureus, epidermidis, & lugdunensis).  Name of physician (or Provider) Contacted: Dr. Ulyess Blossom  Current antibiotics: Pt is currently on Rocephin for Cystitis  Changes to prescribed antibiotics recommended:  Depending on clinical correlation: Would recommend vancomycin until speciation/susceptibilities are available Will also repeat blood cultures to evaluate if potential contaminant.  Results for orders placed or performed during the hospital encounter of 05/20/22  Blood Culture ID Panel (Reflexed) (Collected: 05/20/2022  1:30 PM)  Result Value Ref Range   Enterococcus faecalis NOT DETECTED NOT DETECTED   Enterococcus Faecium NOT DETECTED NOT DETECTED   Listeria monocytogenes NOT DETECTED NOT DETECTED   Staphylococcus species DETECTED (A) NOT DETECTED   Staphylococcus aureus (BCID) NOT DETECTED NOT DETECTED   Staphylococcus epidermidis NOT DETECTED NOT DETECTED   Staphylococcus lugdunensis NOT DETECTED NOT DETECTED   Streptococcus species NOT DETECTED NOT DETECTED   Streptococcus agalactiae NOT DETECTED NOT DETECTED   Streptococcus pneumoniae NOT DETECTED NOT DETECTED   Streptococcus pyogenes NOT DETECTED NOT DETECTED   A.calcoaceticus-baumannii NOT DETECTED NOT DETECTED   Bacteroides fragilis NOT DETECTED NOT DETECTED   Enterobacterales NOT DETECTED NOT DETECTED   Enterobacter cloacae complex NOT DETECTED NOT DETECTED   Escherichia coli NOT DETECTED NOT DETECTED   Klebsiella aerogenes NOT DETECTED NOT DETECTED   Klebsiella  oxytoca NOT DETECTED NOT DETECTED   Klebsiella pneumoniae NOT DETECTED NOT DETECTED   Proteus species NOT DETECTED NOT DETECTED   Salmonella species NOT DETECTED NOT DETECTED   Serratia marcescens NOT DETECTED NOT DETECTED   Haemophilus influenzae NOT DETECTED NOT DETECTED   Neisseria meningitidis NOT DETECTED NOT DETECTED   Pseudomonas aeruginosa NOT DETECTED NOT DETECTED   Stenotrophomonas maltophilia NOT DETECTED NOT DETECTED   Candida albicans NOT DETECTED NOT DETECTED   Candida auris NOT DETECTED NOT DETECTED   Candida glabrata NOT DETECTED NOT DETECTED   Candida krusei NOT DETECTED NOT DETECTED   Candida parapsilosis NOT DETECTED NOT DETECTED   Candida tropicalis NOT DETECTED NOT DETECTED   Cryptococcus neoformans/gattii NOT DETECTED NOT DETECTED    Shanon Brow Gabryel Files 05/21/2022  9:34 AM

## 2022-05-21 NOTE — Progress Notes (Signed)
Progress Note   Patient: Suzanne Dixon OFB:[id number] DOB: Oct 08, 1950 DOA: 05/20/2022     0 DOS: the patient was seen and examined on 05/21/2022   Brief hospital course: NIL BOLSER is a 71 y.o. female with medical history significant for Parkinson's Disease (on Sinemet), Hypertension, Hyperlipidemia, history of Stroke, Paroxysmal Atrial Fibrillation (on Warfarin), Diastolic Heart Failure,  Significant Chronic Lymphedema, Hypothyroidism, Morbid Obesity, and Impaired Mobility (uses wheelchair) who presents to the ED due to reports of increased weakness and confusion by her home health aid found to have UTI and dehydration.   9/2: 1/4 BC+, staph(I.e. not Aureus, epidermidis, & lugdunensis), on vanc and repeat BC pending. Encephalopathy improved.   Assessment and Plan:  Acute Metabolic Encephalopathy in the setting of Parkinson's Disease: Home Health aid workers report an increased amount of confusion and weakness.  This is due to a mild UTI and mild dehydration. 1/2 BC + staph, speciation pending. Thyroid testing nromal. COVID negative. Head CT negative.  - blood culture 1/2 staph, further speciation pending, repeat cultures drawn - U/A with LE and bacteria, urine culture pending  - Continue home SNRI - Hold Gabapentin - Continue home Sinemet  Acute Uncomplicated Cystitis: Patient endorses urinary symptoms.  WBC is 14K with 82% neutrophils.  UA shows many bacteria, large leukocytes, and >50 WBCs.   - abx as above - Follow up on urine cultures   Type II MI Lactic Acidosis Bolus NS followed by LR 100 CC/hr x 10 hours. Repeat lactate 1.5. Flat troponin, ekg with very small ST-depression lateral leads, poor R-wave progression. No chest pain. Suspect demand ischemia.  - Trend Lactate  Chronic Lower Extremity Weakness due to Chronic Lymphedema, Peripheral Neuropathy, Arthritis and Thoracic DDD Patient uses electric wheelchair at baseline.  [place] services report patient's weakness is worse than her  baseline. - PT recommend HH PT, patient already has this set up  Hypomagnesemia -replete   Chronic issues: Hypertension: BP is stable. - Continue home BP meds.   Hyperlipidemia: - Recommend Statin if CK is wnl.   Permanent Atrial Fibrillation: - Continue Lopressor 100 mg BID for rate control. - INR is 2.3.  Platelets are 225K.  Continue Warfarin 6 mg daily for anticoagulation.   Grade 1 Diastolic Heart Failure: - Change Lasix to as needed for now to avoid dehydration.   Chronic Lymphedema: Moderate TR - Keep legs elevated and wear compressive stockings..   Hypothyroidism: - TSH is wnl. - Continue home Synthroid 50 mcg daily.   Osteoarthritis s/p 2022 Right Shoulder Arthroplasty/ Major Depressive Disorder:  - Continue home Duloxetine 60 mg BID and Meloxicam 15 mg as needed.  Hold Gabapentin 600 mg 4 times daily.   GERD: - PPI   Overactive Bladder: - Oxybutynin.   Chronic Gout: - Check uric acid. - Continue home Allopurinol.   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  Consider GLP-1.   OSA: - Wear CPAP nightly.   History of Breast Cancer s/p Right Breast Mastectomy Cholecystectomy Hysterectomy History of Cervical Spine Laminotomy   Note: This patient has been tested and is negative for the novel coronavirus COVID-19.   Level of care:  DVT prophylaxis: Lovenox Code Status: Full Family Communication: None present Disposition Plan:  The patient is from: home and has home health services 3x weekly             Anticipated d/c is to: home and continue home health  Patient is currently: acutely ill Consults called: None Admission status:  Inpatient      Subjective: NAOE. Feels much better, oriented. No other acute complaints. She lives by herself and has done so for a while.   Physical Exam: Vitals:   05/21/22 0523 05/21/22 0735 05/21/22 1044 05/21/22 1201  BP: 121/69 116/72  110/71  Pulse: 72 (!) 58  75   Resp: '18 18  18  '$ Temp: (!) 97.4 F (36.3 C) 97.9 F (36.6 C)  (!) 97.5 F (36.4 C)  TempSrc: Oral Oral    SpO2: 99% 99%  99%  Weight:   98.1 kg   Height:   '5\' 4"'$  (1.626 m)    Physical Exam Vitals and nursing note reviewed.  Constitutional:      Appearance: Normal appearance. She is not ill-appearing.  HENT:     Head: Normocephalic and atraumatic.     Mouth/Throat:     Mouth: Mucous membranes are moist.  Cardiovascular:     Rate and Rhythm: Normal rate and regular rhythm.  Abdominal:     General: Abdomen is flat. There is no distension.     Palpations: Abdomen is soft. There is no mass.     Tenderness: There is no abdominal tenderness.  Musculoskeletal:     Right lower leg: No edema.     Left lower leg: No edema.  Skin:    General: Skin is warm and dry.     Capillary Refill: Capillary refill takes less than 2 seconds.  Neurological:     Mental Status: She is alert.  Psychiatric:        Mood and Affect: Mood normal.        Behavior: Behavior normal.     Data Reviewed:     Latest Ref Rng & Units 05/21/2022    4:50 AM 05/20/2022   11:23 AM 06/28/2021    4:25 AM  CBC  WBC 4.0 - 10.5 K/uL 13.6  14.5  5.6   Hemoglobin 12.0 - 15.0 g/dL 12.7  14.2  12.1   Hematocrit 36.0 - 46.0 % 37.4  43.4  36.2   Platelets 150 - 400 K/uL 197  225  215        Latest Ref Rng & Units 05/21/2022    4:50 AM 05/20/2022   11:23 AM 06/28/2021    4:25 AM  BMP  Glucose 70 - 99 mg/dL 97  125  83   BUN 8 - 23 mg/dL '12  14  12   '$ Creatinine 0.44 - 1.00 mg/dL 0.57  0.76  0.58   Sodium 135 - 145 mmol/L 135  137  137   Potassium 3.5 - 5.1 mmol/L 3.5  3.7  4.3   Chloride 98 - 111 mmol/L 106  100  105   CO2 22 - 32 mmol/L '26  28  26   '$ Calcium 8.9 - 10.3 mg/dL 7.8  8.3  8.5    Lactate 2.2, 1.5 Mg 1.6 Phos 3.2 1/4 BC staph U/A LE, +bacteria, culture pending TSH 0.7, t4 0.95  CT head: No acute intracranial process.  Family Communication: none at bedside  Disposition: Status is:  Observation The patient will require care spanning > 2 midnights and should be moved to inpatient because: blood cultures 1/4 staph, on iv antibiotics  Planned Discharge Destination: Home with Home Health    Time spent: 35 minutes  Author: Lorelei Pont, MD 05/21/2022 1:06 PM  For on call review [email or web address].

## 2022-05-22 DIAGNOSIS — G9341 Metabolic encephalopathy: Secondary | ICD-10-CM | POA: Diagnosis not present

## 2022-05-22 LAB — CBC
HCT: 34 % — ABNORMAL LOW (ref 36.0–46.0)
Hemoglobin: 11.4 g/dL — ABNORMAL LOW (ref 12.0–15.0)
MCH: 29.9 pg (ref 26.0–34.0)
MCHC: 33.5 g/dL (ref 30.0–36.0)
MCV: 89.2 fL (ref 80.0–100.0)
Platelets: 196 10*3/uL (ref 150–400)
RBC: 3.81 MIL/uL — ABNORMAL LOW (ref 3.87–5.11)
RDW: 13.3 % (ref 11.5–15.5)
WBC: 10.3 10*3/uL (ref 4.0–10.5)
nRBC: 0 % (ref 0.0–0.2)

## 2022-05-22 LAB — BASIC METABOLIC PANEL
Anion gap: 4 — ABNORMAL LOW (ref 5–15)
BUN: 10 mg/dL (ref 8–23)
CO2: 28 mmol/L (ref 22–32)
Calcium: 8.1 mg/dL — ABNORMAL LOW (ref 8.9–10.3)
Chloride: 106 mmol/L (ref 98–111)
Creatinine, Ser: 0.66 mg/dL (ref 0.44–1.00)
GFR, Estimated: >60 mL/min (ref >60)
Glucose, Bld: 92 mg/dL (ref 70–99)
Potassium: 3.9 mmol/L (ref 3.5–5.1)
Sodium: 138 mmol/L (ref 135–145)

## 2022-05-22 LAB — URINE CULTURE: Culture: >=100000 — AB

## 2022-05-22 LAB — MAGNESIUM: Magnesium: 1.8 mg/dL (ref 1.7–2.4)

## 2022-05-22 LAB — PROTIME-INR
INR: 2.3 — ABNORMAL HIGH (ref 0.8–1.2)
Prothrombin Time: 24.8 seconds — ABNORMAL HIGH (ref 11.4–15.2)

## 2022-05-22 MED ORDER — MAGNESIUM SULFATE IN D5W 1-5 GM/100ML-% IV SOLN
1.0000 g | Freq: Once | INTRAVENOUS | Status: AC
Start: 2022-05-22 — End: 2022-05-22
  Administered 2022-05-22: 1 g via INTRAVENOUS
  Filled 2022-05-22: qty 100

## 2022-05-22 MED ORDER — CEFAZOLIN SODIUM-DEXTROSE 2-4 GM/100ML-% IV SOLN
2.0000 g | Freq: Three times a day (TID) | INTRAVENOUS | Status: DC
Start: 2022-05-22 — End: 2022-05-23
  Administered 2022-05-22 (×2): 2 g via INTRAVENOUS
  Filled 2022-05-22 (×3): qty 100

## 2022-05-22 NOTE — Progress Notes (Signed)
A consult was placed to the IV Therapist to restart the pt's iv; current site is in [facility];  pt will not allow an iv to be placed in the R arm; attempted x 2 to restart the iv, using ultrasound;  pt says "it's too painful; just keep the one I have."  No further attempts made;  RN aware.

## 2022-05-22 NOTE — Progress Notes (Signed)
Progress Note   Patient: Suzanne Dixon TDD:[id number] DOB: Jan 16, 1951 DOA: 05/20/2022     1 DOS: the patient was seen and examined on 05/22/2022   Brief hospital course: Suzanne Dixon is a 71 y.o. female with medical history significant for Parkinson's Disease (on Sinemet), Hypertension, Hyperlipidemia, history of Stroke, Paroxysmal Atrial Fibrillation (on Warfarin), Diastolic Heart Failure,  Significant Chronic Lymphedema, Hypothyroidism, Morbid Obesity, and Impaired Mobility (uses wheelchair) who presents to the ED due to reports of increased weakness and confusion by her home health aid found to be secondary to UTI and dehydration.   9/2 Urine Cultures grew Klebsiella sensitive to cephalosporins and resistant to bactrim. 9/2 Blood Cultures grew Staph Capitis.  9/3 Repeat Blood Cultures were no growth.   Assessment and Plan:  Acute Metabolic Encephalopathy in the setting of Parkinson's Disease, resolved:  Head CT showed no acute abnormality.  This was secondary to UTI and dehydration. - Symptoms have improved but patient still endorses mild weakness and lethargy.   - Continue IV antibiotics. - Discontinue IV fluids and encourage oral hydration.  Acute Uncomplicated Cystitis: Patient endorses urinary symptoms.  UA showed many bacteria, large leukocytes, and >50 WBCs.   - This is Day 3 of antibiotics.  De-escalate to IV Cefazolin 2g q8hrs as this covers Klebsiella and Staph species.  Once blood cultures are NG x 48 hours, we will de-escalate to Cefdinir 300 mg BID and organize discharge plan. Treat for 5-7 days depending on clinical improvement.  Staph Capitis Bacteremia: This is a contaminant. - 9/2 repeat blood cultures are NG x 24 hours.  Lactic Acidosis, resolved Type 2 Demand Ischemia  Grade 1 Diastolic Heart Failure: - Patient is euvolemic.  Physical Deconditioning / Chronic Lower Extremity Weakness due to Chronic Lymphedema, Peripheral Neuropathy, Arthritis and Thoracic DDD:  Patient uses electric wheelchair at baseline.   - PT/OT recommends short term Rehab at SNF. We will discuss with CM.   Chronic issues: Hypertension: BP is stable. - Continue home BP meds.   Hyperlipidemia: - Recommend Statin.   Permanent Atrial Fibrillation: - Continue Lopressor 100 mg BID for rate control. - INR is at goal.  Continue Warfarin regiment for anticoagulation.   Grade 1 Diastolic Heart Failure: - Change Lasix to as needed for now to avoid dehydration.   Chronic Lymphedema: Moderate TR - Keep legs elevated and wear compressive stockings..   Hypothyroidism: - TSH is wnl. - Continue home Synthroid 50 mcg daily.   Osteoarthritis s/p 2022 Right Shoulder Arthroplasty/ Major Depressive Disorder:  - Continue home Duloxetine 60 mg BID and Meloxicam 15 mg as needed.  Hold Gabapentin 600 mg 4 times daily.   GERD: - PPI   Overactive Bladder: - Oxybutynin.   Chronic Gout: - Continue home Allopurinol.   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  Consider GLP-1.   OSA: - Wear CPAP nightly.   History of Breast Cancer s/p Right Breast Mastectomy Cholecystectomy Hysterectomy History of Cervical Spine Laminotomy    Level of care:  DVT prophylaxis: Lovenox Code Status: Full Family Communication: None present Disposition Plan:  The patient is from: home and has home health services 3x weekly             Anticipated d/c is to: PT/OT recommends short term Rehab at SNF.             Patient is currently: acutely ill Consults called: None Admission status:  Inpatient      Subjective: Ms.  Vandezande states "I feel somewhat better.  I still feel weak and a little lethargic."  She denies any chest pain or shortness of breath.  We discussed plan of care.  We will monitor her one more day.  Physical Exam: Vitals:   05/21/22 1647 05/21/22 2138 05/22/22 0602 05/22/22 0854  BP: 107/67 114/70 107/66 (!) 140/84  Pulse: 73 80 71 72  Resp: '16  18 18 18  '$ Temp: 98 F (36.7 C) 97.9 F (36.6 C) 98.1 F (36.7 C) 97.7 F (36.5 C)  TempSrc:      SpO2: 100% 100% 99% 100%  Weight:      Height:       Physical Exam Vitals and nursing note reviewed.  Constitutional:      Appearance: Normal appearance. She is not ill-appearing.  HENT:     Head: Normocephalic and atraumatic.     Mouth/Throat:     Mouth: Mucous membranes are moist.  Cardiovascular:     Rate and Rhythm: Normal rate and regular rhythm.  Abdominal:     General: Abdomen is flat. There is no distension.     Palpations: Abdomen is soft. There is no mass.     Tenderness: There is no abdominal tenderness.  Musculoskeletal:     Right lower leg: No edema.     Left lower leg: No edema.  Skin:    General: Skin is warm and dry.     Capillary Refill: Capillary refill takes less than 2 seconds.  Neurological:     Mental Status: She is alert.  Psychiatric:        Mood and Affect: Mood normal.        Behavior: Behavior normal.     Data Reviewed:     Latest Ref Rng & Units 05/22/2022    4:23 AM 05/21/2022    4:50 AM 05/20/2022   11:23 AM  CBC  WBC 4.0 - 10.5 K/uL 10.3  13.6  14.5   Hemoglobin 12.0 - 15.0 g/dL 11.4  12.7  14.2   Hematocrit 36.0 - 46.0 % 34.0  37.4  43.4   Platelets 150 - 400 K/uL 196  197  225        Latest Ref Rng & Units 05/22/2022    4:23 AM 05/21/2022    4:50 AM 05/20/2022   11:23 AM  BMP  Glucose 70 - 99 mg/dL 92  97  125   BUN 8 - 23 mg/dL '10  12  14   '$ Creatinine 0.44 - 1.00 mg/dL 0.66  0.57  0.76   Sodium 135 - 145 mmol/L 138  135  137   Potassium 3.5 - 5.1 mmol/L 3.9  3.5  3.7   Chloride 98 - 111 mmol/L 106  106  100   CO2 22 - 32 mmol/L '28  26  28   '$ Calcium 8.9 - 10.3 mg/dL 8.1  7.8  8.3    Lactate 2.2, 1.5 Mg 1.6 Phos 3.2 1/4 BC staph U/A LE, +bacteria, culture pending TSH 0.7, t4 0.95  CT head: No acute intracranial process.  Family Communication: none at bedside  Disposition: Status is: Observation The patient will require  care spanning > 2 midnights and should be moved to inpatient because: blood cultures 1/4 staph, on iv antibiotics  Planned Discharge Destination: Home with Home Health    Time spent: 35 minutes  Author: George Hugh, MD 05/22/2022 1:22 PM  For on call review [email or web address].

## 2022-05-23 DIAGNOSIS — G9341 Metabolic encephalopathy: Secondary | ICD-10-CM | POA: Diagnosis not present

## 2022-05-23 LAB — MAGNESIUM: Magnesium: 1.7 mg/dL (ref 1.7–2.4)

## 2022-05-23 LAB — BASIC METABOLIC PANEL
Anion gap: 10 (ref 5–15)
BUN: 7 mg/dL — ABNORMAL LOW (ref 8–23)
CO2: 26 mmol/L (ref 22–32)
Calcium: 8.1 mg/dL — ABNORMAL LOW (ref 8.9–10.3)
Chloride: 102 mmol/L (ref 98–111)
Creatinine, Ser: 0.6 mg/dL (ref 0.44–1.00)
GFR, Estimated: >60 mL/min (ref >60)
Glucose, Bld: 110 mg/dL — ABNORMAL HIGH (ref 70–99)
Potassium: 3.5 mmol/L (ref 3.5–5.1)
Sodium: 138 mmol/L (ref 135–145)

## 2022-05-23 LAB — PROTIME-INR
INR: 2.6 — ABNORMAL HIGH (ref 0.8–1.2)
Prothrombin Time: 27.5 seconds — ABNORMAL HIGH (ref 11.4–15.2)

## 2022-05-23 LAB — CULTURE, BLOOD (ROUTINE X 2): Special Requests: ADEQUATE

## 2022-05-23 MED ORDER — GABAPENTIN 300 MG PO CAPS
300.0000 mg | ORAL_CAPSULE | Freq: Three times a day (TID) | ORAL | Status: DC
  Administered 2022-05-23 – 2022-05-24 (×3): 300 mg via ORAL
  Filled 2022-05-23 (×3): qty 1

## 2022-05-23 MED ORDER — CEPHALEXIN 500 MG PO CAPS
500.0000 mg | ORAL_CAPSULE | Freq: Three times a day (TID) | ORAL | Status: DC
Start: 2022-05-23 — End: 2022-05-24
  Administered 2022-05-23 – 2022-05-24 (×3): 500 mg via ORAL
  Filled 2022-05-23 (×3): qty 1

## 2022-05-23 NOTE — Progress Notes (Signed)
PROGRESS NOTE  Suzanne Dixon  AST:[id number] DOB: [birth year] DOA: 05/20/2022 PCP: Adin Hector, MD   Brief Narrative: Patient is a 71 year old female with history of Parkinson disease, hypertension, hyperlipidemia, stroke, paroxysmal A-fib on warfarin, diastolic CHF, chronic lymphedema, hypothyroidism, morbid obesity, impaired mobility ambulating with wheelchair who presented here with weakness, confusion.  Work-up done after admission revealed urinary tract infection, dehydration.  Started on antibiotics.  PT/OT recommending skilled facility on discharge.  TOC following for placement.  Patient was little reluctant about skilled nursing facility, but will think about it.  Assessment & Plan:  Principal Problem:   Acute metabolic encephalopathy  Acute metabolic encephalopathy: Likely secondary to UTI on the background of Parkinson disease.  CT head did not show any acute findings.  Patient was also found to be dehydrated on presentation.  Currently mental status has improved and currently at baseline.  She is fully alert and oriented.  UTI: Urine culture Klebsiella pneumoniae.  Antibiotics changed to oral.  One of the blood cultures showed staph capitis, this is most likely contamination.  Repeat blood cultures have been negative.  Lactic acidosis: Resolved  Grade 1 diastolic dysfunction: Patient is euvolemic.  Hypothyroidism: Continue Synthyroid  Hypertension: On metoprolol  Paroxysmal A-fib: Currently in normal sinus rhythm.  Continue metoprolol for rate control, on warfarin for anticoagulation  History of Parkinson disease: On Sinemet  Obesity: BMI of 37.1  Physical deconditioning/debility/generalized weakness: Multifactorial secondary to lymphedema, peripheral neuropathy, arthritis, degenerative disc disease.  Uses electrical wheelchair at baseline.  PT/OT recommending skilled nursing facility.  TOC following         DVT prophylaxis: warfarin (COUMADIN) tablet 3 mg   warfarin (COUMADIN) tablet 6 mg     Code Status: Full Code  Family Communication: None at bedside  Patient status:Inpatient  Patient is from :Home  Anticipated discharge to:SNF  Estimated DC date: Whenever bed is available to SNF   Consultants: None  Procedures:None  Antimicrobials:  Anti-infectives (From admission, onward)    Start     Dose/Rate Route Frequency Ordered Stop   05/23/22 1400  cephALEXin (KEFLEX) capsule 500 mg        500 mg Oral Every 8 hours 05/23/22 0940 05/25/22 0559   05/22/22 1400  ceFAZolin (ANCEF) IVPB 2g/100 mL premix  Status:  Discontinued        2 g 200 mL/hr over 30 Minutes Intravenous Every 8 hours 05/22/22 1128 05/23/22 0940   05/22/22 0700  vancomycin (VANCOREADY) IVPB 1250 mg/250 mL  Status:  Discontinued        1,250 mg 166.7 mL/hr over 90 Minutes Intravenous Every 24 hours 05/21/22 1116 05/22/22 1128   05/21/22 1215  vancomycin (VANCOCIN) IVPB 1000 mg/200 mL premix        1,000 mg 200 mL/hr over 60 Minutes Intravenous  Once 05/21/22 1116 05/21/22 1539   05/21/22 1115  vancomycin (VANCOCIN) IVPB 1000 mg/200 mL premix        1,000 mg 200 mL/hr over 60 Minutes Intravenous  Once 05/21/22 1027 05/21/22 1436   05/21/22 0800  cefTRIAXone (ROCEPHIN) 1 g in sodium chloride 0.9 % 100 mL IVPB        1 g 200 mL/hr over 30 Minutes Intravenous Every 24 hours 05/20/22 1438 05/22/22 0834   05/20/22 1330  cefTRIAXone (ROCEPHIN) 2 g in sodium chloride 0.9 % 100 mL IVPB        2 g 200 mL/hr over 30 Minutes Intravenous  Once 05/20/22 1317 05/20/22 1410  Subjective: Patient seen and examined at the bedside this morning.  Hemodynamically stable.  Lying in bed.  Comfortable, alert and oriented.  No new complaints  Objective: Vitals:   05/22/22 1638 05/22/22 2000 05/23/22 0438 05/23/22 0812  BP: (!) 141/72 133/68 (!) 145/74 (!) 150/87  Pulse: 68 70 64 73  Resp: '18 18 18 17  '$ Temp: 98.8 F (37.1 C) 98.7 F (37.1 C) 98 F (36.7 C) 97.6 F (36.4  C)  TempSrc:  Oral    SpO2: 100% 100% 100% 95%  Weight:      Height:        Intake/Output Summary (Last 24 hours) at 05/23/2022 1106 Last data filed at 05/23/2022 1015 Gross per 24 hour  Intake 186.66 ml  Output 500 ml  Net -313.34 ml   Filed Weights   05/21/22 1044  Weight: 98.1 kg    Examination:  General exam: Overall comfortable, not in distress,obese HEENT: PERRL Respiratory system:  no wheezes or crackles  Cardiovascular system: S1 & S2 heard, RRR.  Gastrointestinal system: Abdomen is nondistended, soft and nontender. Central nervous system: Alert and oriented Extremities: No edema, no clubbing ,no cyanosis Skin: No rashes, no ulcers,no icterus     Data Reviewed: I have personally reviewed following labs and imaging studies  CBC: Recent Labs  Lab 05/20/22 1123 05/21/22 0450 05/22/22 0423  WBC 14.5* 13.6* 10.3  NEUTROABS 12.0*  --   --   HGB 14.2 12.7 11.4*  HCT 43.4 37.4 34.0*  MCV 92.1 88.8 89.2  PLT 225 197 726   Basic Metabolic Panel: Recent Labs  Lab 05/20/22 1123 05/20/22 1351 05/21/22 0450 05/22/22 0423 05/23/22 0628  NA 137  --  135 138 138  K 3.7  --  3.5 3.9 3.5  CL 100  --  106 106 102  CO2 28  --  '26 28 26  '$ GLUCOSE 125*  --  97 92 110*  BUN 14  --  12 10 7*  CREATININE 0.76  --  0.57 0.66 0.60  CALCIUM 8.3*  --  7.8* 8.1* 8.1*  MG  --  1.6*  --  1.8 1.7  PHOS  --  3.2  --   --   --      Recent Results (from the past 240 hour(s))  Urine Culture     Status: Abnormal   Collection Time: 05/20/22 11:23 AM   Specimen: Urine, Clean Catch  Result Value Ref Range Status   Specimen Description   Final    URINE, CLEAN CATCH Performed at [facility], [address] [place], [place] [address]    Special Requests   Final    NONE Performed at [facility], [address] [place], [place] [address]    Culture >=100,000 COLONIES/mL KLEBSIELLA PNEUMONIAE (A)  Final   Report Status 05/22/2022 FINAL  Final   Organism  ID, Bacteria KLEBSIELLA PNEUMONIAE (A)  Final      Susceptibility   Klebsiella pneumoniae - MIC*    AMPICILLIN RESISTANT Resistant     CEFAZOLIN <=4 SENSITIVE Sensitive     CEFEPIME <=0.12 SENSITIVE Sensitive     CEFTRIAXONE <=0.25 SENSITIVE Sensitive     CIPROFLOXACIN <=0.25 SENSITIVE Sensitive     GENTAMICIN <=1 SENSITIVE Sensitive     IMIPENEM <=0.25 SENSITIVE Sensitive     NITROFURANTOIN 64 INTERMEDIATE Intermediate     TRIMETH/SULFA >=320 RESISTANT Resistant     AMPICILLIN/SULBACTAM 4 SENSITIVE Sensitive     PIP/TAZO <=4 SENSITIVE Sensitive     * >=  100,000 COLONIES/mL KLEBSIELLA PNEUMONIAE  SARS Coronavirus 2 by RT PCR (hospital order, performed in [facility] hospital lab) *cepheid single result test* Anterior Nasal Swab     Status: None   Collection Time: 05/20/22 11:23 AM   Specimen: Anterior Nasal Swab  Result Value Ref Range Status   SARS Coronavirus 2 by RT PCR NEGATIVE NEGATIVE Final    Comment: (NOTE) SARS-CoV-2 target nucleic acids are NOT DETECTED.  The SARS-CoV-2 RNA is generally detectable in upper and lower respiratory specimens during the acute phase of infection. The lowest concentration of SARS-CoV-2 viral copies this assay can detect is 250 copies / mL. A negative result does not preclude SARS-CoV-2 infection and should not be used as the sole basis for treatment or other patient management decisions.  A negative result may occur with improper specimen collection / handling, submission of specimen other than nasopharyngeal swab, presence of viral mutation(s) within the areas targeted by this assay, and inadequate number of viral copies (<250 copies / mL). A negative result must be combined with clinical observations, patient history, and epidemiological information.  Fact Sheet for Patients:   https://www.patel.info/  Fact Sheet for Healthcare Providers: https://hall.com/  This test is not yet approved or   cleared by the [place] FDA and has been authorized for detection and/or diagnosis of SARS-CoV-2 by FDA under an Emergency Use Authorization (EUA).  This EUA will remain in effect (meaning this test can be used) for the duration of the COVID-19 declaration under Section 564(b)(1) of the Act, 21 U.S.C. section 360bbb-3(b)(1), unless the authorization is terminated or revoked sooner.  Performed at [facility], [address] [place], [place] [address]   Blood culture (routine x 2)     Status: Abnormal   Collection Time: 05/20/22  1:30 PM   Specimen: BLOOD  Result Value Ref Range Status   Specimen Description   Final    BLOOD LEFT ANTECUBITAL Performed at [facility], [address] [place], [place] [address]    Special Requests   Final    BOTTLES DRAWN AEROBIC AND ANAEROBIC Blood Culture adequate volume Performed at [facility], [address] [place], [place] [address]    Culture  Setup Time   Final    GRAM POSITIVE COCCI IN BOTH AEROBIC AND ANAEROBIC BOTTLES CRITICAL RESULT CALLED TO, READ BACK BY AND VERIFIED WITH: BRANDON BEERS AT 5621 05/21/22.PMF    Culture (A)  Final    STAPHYLOCOCCUS CAPITIS THE SIGNIFICANCE OF ISOLATING THIS ORGANISM FROM A SINGLE VENIPUNCTURE CANNOT BE PREDICTED WITHOUT FURTHER CLINICAL AND CULTURE CORRELATION. SUSCEPTIBILITIES AVAILABLE ONLY ON REQUEST. Performed at [facility], [place] [address], [place], [place] [address]    Report Status 05/23/2022 FINAL  Final  Blood Culture ID Panel (Reflexed)     Status: Abnormal   Collection Time: 05/20/22  1:30 PM  Result Value Ref Range Status   Enterococcus faecalis NOT DETECTED NOT DETECTED Final   Enterococcus Faecium NOT DETECTED NOT DETECTED Final   Listeria monocytogenes NOT DETECTED NOT DETECTED Final   Staphylococcus species DETECTED (A) NOT DETECTED Final    Comment: CRITICAL RESULT CALLED TO, READ BACK BY AND VERIFIED WITH: BRANDON BEERS AT 7846  05/21/22.PMF    Staphylococcus aureus (BCID) NOT DETECTED NOT DETECTED Final   Staphylococcus epidermidis NOT DETECTED NOT DETECTED Final   Staphylococcus lugdunensis NOT DETECTED NOT DETECTED Final   Streptococcus species NOT DETECTED NOT DETECTED Final   Streptococcus agalactiae NOT DETECTED NOT DETECTED Final  Streptococcus pneumoniae NOT DETECTED NOT DETECTED Final   Streptococcus pyogenes NOT DETECTED NOT DETECTED Final   A.calcoaceticus-baumannii NOT DETECTED NOT DETECTED Final   Bacteroides fragilis NOT DETECTED NOT DETECTED Final   Enterobacterales NOT DETECTED NOT DETECTED Final   Enterobacter cloacae complex NOT DETECTED NOT DETECTED Final   Escherichia coli NOT DETECTED NOT DETECTED Final   Klebsiella aerogenes NOT DETECTED NOT DETECTED Final   Klebsiella oxytoca NOT DETECTED NOT DETECTED Final   Klebsiella pneumoniae NOT DETECTED NOT DETECTED Final   Proteus species NOT DETECTED NOT DETECTED Final   Salmonella species NOT DETECTED NOT DETECTED Final   Serratia marcescens NOT DETECTED NOT DETECTED Final   Haemophilus influenzae NOT DETECTED NOT DETECTED Final   Neisseria meningitidis NOT DETECTED NOT DETECTED Final   Pseudomonas aeruginosa NOT DETECTED NOT DETECTED Final   Stenotrophomonas maltophilia NOT DETECTED NOT DETECTED Final   Candida albicans NOT DETECTED NOT DETECTED Final   Candida auris NOT DETECTED NOT DETECTED Final   Candida glabrata NOT DETECTED NOT DETECTED Final   Candida krusei NOT DETECTED NOT DETECTED Final   Candida parapsilosis NOT DETECTED NOT DETECTED Final   Candida tropicalis NOT DETECTED NOT DETECTED Final   Cryptococcus neoformans/gattii NOT DETECTED NOT DETECTED Final    Comment: Performed at [facility], [address] [place], [place] [address]  Blood culture (routine x 2)     Status: None (Preliminary result)   Collection Time: 05/20/22  1:51 PM   Specimen: Left Antecubital; Blood  Result Value Ref Range Status   Specimen  Description LEFT ANTECUBITAL  Final   Special Requests   Final    BOTTLES DRAWN AEROBIC AND ANAEROBIC Blood Culture adequate volume   Culture   Final    NO GROWTH 3 DAYS Performed at [facility], [address] [place], [place] [address]    Report Status PENDING  Incomplete  Culture, blood (Routine X 2) w Reflex to ID Panel     Status: None (Preliminary result)   Collection Time: 05/21/22 11:09 AM   Specimen: BLOOD RIGHT ARM  Result Value Ref Range Status   Specimen Description BLOOD RIGHT ARM  Final   Special Requests   Final    BOTTLES DRAWN AEROBIC AND ANAEROBIC Blood Culture adequate volume   Culture   Final    NO GROWTH 2 DAYS Performed at [facility], [address] [place], [place] [address]    Report Status PENDING  Incomplete  Culture, blood (Routine X 2) w Reflex to ID Panel     Status: None (Preliminary result)   Collection Time: 05/21/22 11:17 AM   Specimen: BLOOD LEFT HAND  Result Value Ref Range Status   Specimen Description BLOOD LEFT HAND  Final   Special Requests   Final    BOTTLES DRAWN AEROBIC AND ANAEROBIC Blood Culture adequate volume   Culture   Final    NO GROWTH 2 DAYS Performed at [facility], [address] [place], [place] [address]    Report Status PENDING  Incomplete  MRSA Next Gen by PCR, Nasal     Status: Abnormal   Collection Time: 05/21/22  1:26 PM   Specimen: Nasal Mucosa; Nasal Swab  Result Value Ref Range Status   MRSA by PCR Next Gen DETECTED (A) NOT DETECTED Final    Comment: RESULT CALLED TO, READ BACK BY AND VERIFIED WITH: ROBIN THOMAS AT 1601 05/21/22.PMF (NOTE) The GeneXpert MRSA Assay (FDA approved for NASAL specimens only), is one component  of a comprehensive MRSA colonization surveillance program. It is not intended to diagnose MRSA infection nor to guide or monitor treatment for MRSA infections. Test performance is not FDA approved in patients less than 29 years old. Performed at [facility], [address], [place], [place] [address]      Radiology Studies: No results found.  Scheduled Meds:  allopurinol  300 mg Oral Daily   carbidopa-levodopa  1.5 tablet Oral TID   cephALEXin  500 mg Oral Q8H   DULoxetine  60 mg Oral BID   levothyroxine  50 mcg Oral QAC breakfast   metoprolol tartrate  100 mg Oral BID   oxybutynin  5 mg Oral BID   pantoprazole  40 mg Oral Daily   warfarin  6 mg Oral Once per day on Sun Tue Wed Thu Sat   And   warfarin  3 mg Oral Once per day on Mon Fri   Warfarin - Physician Dosing Inpatient   Does not apply q1600   Continuous Infusions:   LOS: 2 days   Shelly Coss, MD [facility] P9/12/2021, 11:06 AM

## 2022-05-24 DIAGNOSIS — G9341 Metabolic encephalopathy: Secondary | ICD-10-CM | POA: Diagnosis not present

## 2022-05-24 LAB — PROTIME-INR
INR: 3 — ABNORMAL HIGH (ref 0.8–1.2)
Prothrombin Time: 30.8 seconds — ABNORMAL HIGH (ref 11.4–15.2)

## 2022-05-24 MED ORDER — CEPHALEXIN 500 MG PO CAPS: 500.0000 mg | ORAL_CAPSULE | Freq: Three times a day (TID) | ORAL | 0 refills | Status: DC

## 2022-05-24 MED ORDER — MUPIROCIN 2 % EX OINT
TOPICAL_OINTMENT | Freq: Two times a day (BID) | CUTANEOUS | Status: DC
  Filled 2022-05-24: qty 22

## 2022-05-24 MED ORDER — GABAPENTIN 600 MG PO TABS: 300.0000 mg | ORAL_TABLET | Freq: Three times a day (TID) | ORAL | 11 refills | Status: DC

## 2022-05-24 NOTE — TOC Transition Note (Signed)
Transition of Care [facility]) - CM/SW Discharge Note   Patient Details  Name: Suzanne Dixon MRN: [id number] Date of Birth: 06-Apr-1951  [facility]) CM/SW Contact:  Laurena Slimmer, RN Phone Number: 05/24/2022, 4:33 PM   Clinical Narrative:    Spoke with patient. Advised patient she was active for [facility] via [place] and they would be in contact with her. Patient ride will arrive at [place] signing off.          Patient Goals and CMS Choice        Discharge Placement                       Discharge Plan and Services                                     Social Determinants of Health (SDOH) Interventions     Readmission Risk Interventions    06/21/2021    4:12 PM  Readmission Risk Prevention Plan  Transportation Screening Complete  PCP or Specialist Appt within 3-5 Days Complete  HRI or [place] Complete  Social Work Consult for [place] Planning/Counseling Complete  Palliative Care Screening Not Applicable  Medication Review [employer]) Complete

## 2022-05-24 NOTE — Care Management Important Message (Signed)
Important Message  Patient Details  Name: Suzanne Dixon MRN: [id number] Date of Birth: Jul 19, 1951   Medicare Important Message Given:  Yes     [name] 05/24/2022, 1:36 PM

## 2022-05-24 NOTE — Care Management Important Message (Signed)
Important Message  Patient Details  Name: Suzanne Dixon MRN: [id number] Date of Birth: Jan 20, 1951   Medicare Important Message Given:        Darius Bump Mashal Slavick 05/24/2022, 1:35 PM

## 2022-05-24 NOTE — Discharge Summary (Signed)
Physician Discharge Summary  Suzanne Dixon WCB:[id number] DOB: June 08, 1951 DOA: 05/20/2022  PCP: Adin Hector, MD  Admit date: 05/20/2022 Discharge date: 05/24/2022  Admitted From: Home Disposition:  Home  Discharge Condition:Stable CODE STATUS:FULL Diet recommendation: Heart Healthy  Brief/Interim Summary: Patient is a 71 year old female with history of Parkinson disease, hypertension, hyperlipidemia, stroke, paroxysmal A-fib on warfarin, diastolic CHF, chronic lymphedema, hypothyroidism, morbid obesity, impaired mobility ambulating with wheelchair who presented here with weakness, confusion.  Work-up done after admission revealed urinary tract infection, dehydration.  Started on antibiotics.  PT/OT recommending skilled facility on discharge.  Patient declined and wants to go home with home health.  Medically stable for discharge.  Following problems were addressed during hospitalization:  Acute metabolic encephalopathy: Likely secondary to UTI on the background of Parkinson disease.  CT head did not show any acute findings.  Patient was also found to be dehydrated on presentation. Currently mental status has improved and currently at baseline.  She is fully alert and oriented.   UTI: Urine culture Klebsiella pneumoniae.  Antibiotics changed to oral.  One of the blood cultures showed staph capitis, this is most likely contamination.  Repeat blood cultures have been negative.   Lactic acidosis: Resolved   Grade 1 diastolic dysfunction: Patient is euvolemic.  Previously taking Lasix but has been discontinued now.   Hypothyroidism: Continue Synthyroid   Hypertension: On metoprolol   Paroxysmal A-fib: Currently in normal sinus rhythm.  Continue metoprolol for rate control, on warfarin for anticoagulation   History of Parkinson disease: On Sinemet   Obesity: BMI of 37.1   Physical deconditioning/debility/generalized weakness: Multifactorial secondary to lymphedema, peripheral  neuropathy, arthritis, degenerative disc disease.  Uses electrical wheelchair at baseline.  PT/OT recommending skilled nursing facility but patient declines despite multiple recommendation from my side.  She lives alone      Discharge Diagnoses:  Principal Problem:   Acute metabolic encephalopathy    Discharge Instructions  Discharge Instructions     Diet - low sodium heart healthy   Complete by: As directed    Discharge instructions   Complete by: As directed    1)Please take prescribed medication as instructed 2)Follow up with home health 3)Follow up with your PCP in a week   Increase activity slowly   Complete by: As directed       Allergies as of 05/24/2022       Reactions   Advair Diskus [fluticasone-salmeterol] Other (See Comments)   Joint pain    Alendronate    Muscle pain   Singulair [montelukast] Other (See Comments)   "muscle pain"   Sulfa Antibiotics Hives   Venlafaxine Other (See Comments)   "altered mental status/tremors"        Medication List     STOP taking these medications    furosemide 40 MG tablet Commonly known as: LASIX   potassium chloride 10 MEQ tablet Commonly known as: KLOR-CON       TAKE these medications    allopurinol 300 MG tablet Commonly known as: ZYLOPRIM Take 300 mg by mouth daily.   benzonatate 100 MG capsule Commonly known as: TESSALON Take 100 mg by mouth 3 (three) times daily as needed for cough.   Calcium Carb-Cholecalciferol 600-400 MG-UNIT Tabs Take 1 tablet by mouth 2 (two) times daily with a meal.   carbidopa-levodopa 25-100 MG tablet Commonly known as: SINEMET IR Take 1.5 tablets by mouth 3 (three) times daily.   cephALEXin 500 MG capsule Commonly known as: KEFLEX Take 1  capsule (500 mg total) by mouth every 8 (eight) hours.   cyanocobalamin 1000 MCG tablet Commonly known as: VITAMIN B12 Take 1,000 mcg by mouth daily.   DULoxetine 60 MG capsule Commonly known as: CYMBALTA Take 1 capsule by  mouth 2 (two) times daily.   gabapentin 600 MG tablet Commonly known as: NEURONTIN Take 0.5 tablets (300 mg total) by mouth 3 (three) times daily. What changed:  how much to take when to take this   hydrocortisone 2.5 % cream Apply topically 2 (two) times daily as needed. Home med.   levothyroxine 50 MCG tablet Commonly known as: SYNTHROID Take 50 mcg by mouth daily before breakfast.   Magnesium 400 MG Caps Take 400 mg by mouth daily.   meloxicam 15 MG tablet Commonly known as: MOBIC Take 1 tablet (15 mg total) by mouth daily as needed for pain.   metoprolol tartrate 100 MG tablet Commonly known as: LOPRESSOR Take 1 tablet (100 mg total) by mouth 2 (two) times daily. Reduced from 200 mg.   Multiple Vitamins tablet Take 2 tablets by mouth daily.   omeprazole 20 MG capsule Commonly known as: PRILOSEC Take 20 mg by mouth daily.   oxybutynin 5 MG tablet Commonly known as: DITROPAN Take 5 mg by mouth 2 (two) times daily.   PSORIASIS/ECZEMA RELIEF EX Apply 1 application topically daily as needed (eczema).   triamcinolone 0.025 % cream Commonly known as: KENALOG Apply 1 application topically 2 (two) times daily as needed. Home med.   warfarin 6 MG tablet Commonly known as: COUMADIN Take 1 tablet (6 mg total) by mouth daily at 4 PM. Reduced from 7 mg.   warfarin 1 MG tablet Commonly known as: COUMADIN Take 1 tablet by mouth daily.        Follow-up Information     Tama High III, MD. Schedule an appointment as soon as possible for a visit in 1 week(s).   Specialty: Internal Medicine Contact information: [place] [address] [phone]                Allergies  Allergen Reactions   Advair Diskus [Fluticasone-Salmeterol] Other (See Comments)    Joint pain    Alendronate     Muscle pain   Singulair [Montelukast] Other (See Comments)    "muscle pain"   Sulfa Antibiotics Hives   Venlafaxine Other (See  Comments)    "altered mental status/tremors"    Consultations: None   Procedures/Studies: CT HEAD WO CONTRAST (5MM)  Result Date: 05/20/2022 CLINICAL DATA:  Headache, new or worsening. EXAM: CT HEAD WITHOUT CONTRAST TECHNIQUE: Contiguous axial images were obtained from the base of the skull through the vertex without intravenous contrast. RADIATION DOSE REDUCTION: This exam was performed according to the departmental dose-optimization program which includes automated exposure control, adjustment of the mA and/or kV according to patient size and/or use of iterative reconstruction technique. COMPARISON:  CT brain 02/16/2022 FINDINGS: Brain: The ventricles are normal in size and configuration. The basilar cisterns are patent. No mass, mass effect, or midline shift. No acute intracranial hemorrhage is seen. No abnormal extra-axial fluid collection. Preservation of the normal cortical gray-white interface without CT evidence of an acute major vascular territorial cortical based infarction. Vascular: No hyperdense vessel or unexpected calcification. Skull: There is again a well-circumscribed bony protuberance extending outward from the left occipital calvarium, likely reflecting an osteoma (axial series 3, image 15). Normal. Negative for fracture or focal lesion. Sinuses/Orbits: Status post bilateral  ocular lens replacements. The visualized paranasal sinuses and mastoid air cells are clear. Other: None. IMPRESSION: No acute intracranial process. Electronically Signed   By: Yvonne Kendall M.D.   On: 05/20/2022 10:57   DG Chest Portable 1 View  Result Date: 05/20/2022 CLINICAL DATA:  Weakness EXAM: PORTABLE CHEST 1 VIEW COMPARISON:  Chest radiograph 06/19/2021 FINDINGS: Low lung volume exam. Stable cardiac and mediastinal contours. Basilar atelectasis. No pleural effusion or pneumothorax. IMPRESSION: Low lung volume exam.  Basilar atelectasis. Electronically Signed   By: Lovey Newcomer M.D.   On: 05/20/2022 10:48       Subjective: Patient seen and examined the bedside this morning.  Hemodynamically stable for discharge today.  Discharge Exam: Vitals:   05/24/22 0513 05/24/22 1000  BP: (!) 154/90 126/76  Pulse: 69   Resp: 18 19  Temp: 98 F (36.7 C) 98 F (36.7 C)  SpO2: 100% 94%   Vitals:   05/23/22 2231 05/23/22 2233 05/24/22 0513 05/24/22 1000  BP: (!) 152/89 (!) 159/86 (!) 154/90 126/76  Pulse: 74 73 69   Resp:  '18 18 19  '$ Temp:  97.8 F (36.6 C) 98 F (36.7 C) 98 F (36.7 C)  TempSrc:    Oral  SpO2: 100% 100% 100% 94%  Weight:      Height:        General: Pt is alert, awake, not in acute distress, obese Cardiovascular: RRR, S1/S2 +, no rubs, no gallops Respiratory: CTA bilaterally, no wheezing, no rhonchi Abdominal: Soft, NT, ND, bowel sounds + Extremities: no edema, no cyanosis    The results of significant diagnostics from this hospitalization (including imaging, microbiology, ancillary and laboratory) are listed below for reference.     Microbiology: Recent Results (from the past 240 hour(s))  Urine Culture     Status: Abnormal   Collection Time: 05/20/22 11:23 AM   Specimen: Urine, Clean Catch  Result Value Ref Range Status   Specimen Description   Final    URINE, CLEAN CATCH Performed at [facility], [address] [place], [place] [address]    Special Requests   Final    NONE Performed at [facility], [place],  [address]    Culture >=100,000 COLONIES/mL KLEBSIELLA PNEUMONIAE (A)  Final   Report Status 05/22/2022 FINAL  Final   Organism ID, Bacteria KLEBSIELLA PNEUMONIAE (A)  Final      Susceptibility   Klebsiella pneumoniae - MIC*    AMPICILLIN RESISTANT Resistant     CEFAZOLIN <=4 SENSITIVE Sensitive     CEFEPIME <=0.12 SENSITIVE Sensitive     CEFTRIAXONE <=0.25 SENSITIVE Sensitive     CIPROFLOXACIN <=0.25 SENSITIVE Sensitive     GENTAMICIN <=1 SENSITIVE Sensitive     IMIPENEM <=0.25 SENSITIVE Sensitive      NITROFURANTOIN 64 INTERMEDIATE Intermediate     TRIMETH/SULFA >=320 RESISTANT Resistant     AMPICILLIN/SULBACTAM 4 SENSITIVE Sensitive     PIP/TAZO <=4 SENSITIVE Sensitive     * >=100,000 COLONIES/mL KLEBSIELLA PNEUMONIAE  SARS Coronavirus 2 by RT PCR (hospital order, performed in [place] hospital lab) *cepheid single result test* Anterior Nasal Swab     Status: None   Collection Time: 05/20/22 11:23 AM   Specimen: Anterior Nasal Swab  Result Value Ref Range Status   SARS Coronavirus 2 by RT PCR NEGATIVE NEGATIVE Final    Comment: (NOTE) SARS-CoV-2 target nucleic acids are NOT DETECTED.  The SARS-CoV-2 RNA is generally detectable in upper and lower respiratory specimens  during the acute phase of infection. The lowest concentration of SARS-CoV-2 viral copies this assay can detect is 250 copies / mL. A negative result does not preclude SARS-CoV-2 infection and should not be used as the sole basis for treatment or other patient management decisions.  A negative result may occur with improper specimen collection / handling, submission of specimen other than nasopharyngeal swab, presence of viral mutation(s) within the areas targeted by this assay, and inadequate number of viral copies (<250 copies / mL). A negative result must be combined with clinical observations, patient history, and epidemiological information.  Fact Sheet for Patients:   https://www.patel.info/  Fact Sheet for Healthcare Providers: https://hall.com/  This test is not yet approved or  cleared by the [place] FDA and has been authorized for detection and/or diagnosis of SARS-CoV-2 by FDA under an Emergency Use Authorization (EUA).  This EUA will remain in effect (meaning this test can be used) for the duration of the COVID-19 declaration under Section 564(b)(1) of the Act, 21 U.S.C. section 360bbb-3(b)(1), unless the authorization is terminated or revoked  sooner.  Performed at [facility], [address] [place], [place] [address]   Blood culture (routine x 2)     Status: Abnormal   Collection Time: 05/20/22  1:30 PM   Specimen: BLOOD  Result Value Ref Range Status   Specimen Description   Final    BLOOD LEFT ANTECUBITAL Performed at [facility], [address] [place], [place] [address]    Special Requests   Final    BOTTLES DRAWN AEROBIC AND ANAEROBIC Blood Culture adequate volume Performed at [facility], [address] [place], [place] [address]    Culture  Setup Time   Final    GRAM POSITIVE COCCI IN BOTH AEROBIC AND ANAEROBIC BOTTLES CRITICAL RESULT CALLED TO, READ BACK BY AND VERIFIED WITH: BRANDON BEERS AT 6629 05/21/22.PMF    Culture (A)  Final    STAPHYLOCOCCUS CAPITIS THE SIGNIFICANCE OF ISOLATING THIS ORGANISM FROM A SINGLE VENIPUNCTURE CANNOT BE PREDICTED WITHOUT FURTHER CLINICAL AND CULTURE CORRELATION. SUSCEPTIBILITIES AVAILABLE ONLY ON REQUEST. Performed at [facility], [place] [address] [place],  [address]    Report Status 05/23/2022 FINAL  Final  Blood Culture ID Panel (Reflexed)     Status: Abnormal   Collection Time: 05/20/22  1:30 PM  Result Value Ref Range Status   Enterococcus faecalis NOT DETECTED NOT DETECTED Final   Enterococcus Faecium NOT DETECTED NOT DETECTED Final   Listeria monocytogenes NOT DETECTED NOT DETECTED Final   Staphylococcus species DETECTED (A) NOT DETECTED Final    Comment: CRITICAL RESULT CALLED TO, READ BACK BY AND VERIFIED WITH: BRANDON BEERS AT 6503 05/21/22.PMF    Staphylococcus aureus (BCID) NOT DETECTED NOT DETECTED Final   Staphylococcus epidermidis NOT DETECTED NOT DETECTED Final   Staphylococcus lugdunensis NOT DETECTED NOT DETECTED Final   Streptococcus species NOT DETECTED NOT DETECTED Final   Streptococcus agalactiae NOT DETECTED NOT DETECTED Final   Streptococcus pneumoniae NOT DETECTED NOT DETECTED Final   Streptococcus  pyogenes NOT DETECTED NOT DETECTED Final   A.calcoaceticus-baumannii NOT DETECTED NOT DETECTED Final   Bacteroides fragilis NOT DETECTED NOT DETECTED Final   Enterobacterales NOT DETECTED NOT DETECTED Final   Enterobacter cloacae complex NOT DETECTED NOT DETECTED Final   Escherichia coli NOT DETECTED NOT DETECTED Final   Klebsiella aerogenes NOT DETECTED NOT DETECTED Final   Klebsiella oxytoca NOT DETECTED NOT DETECTED Final   Klebsiella pneumoniae NOT DETECTED NOT DETECTED  Final   Proteus species NOT DETECTED NOT DETECTED Final   Salmonella species NOT DETECTED NOT DETECTED Final   Serratia marcescens NOT DETECTED NOT DETECTED Final   Haemophilus influenzae NOT DETECTED NOT DETECTED Final   Neisseria meningitidis NOT DETECTED NOT DETECTED Final   Pseudomonas aeruginosa NOT DETECTED NOT DETECTED Final   Stenotrophomonas maltophilia NOT DETECTED NOT DETECTED Final   Candida albicans NOT DETECTED NOT DETECTED Final   Candida auris NOT DETECTED NOT DETECTED Final   Candida glabrata NOT DETECTED NOT DETECTED Final   Candida krusei NOT DETECTED NOT DETECTED Final   Candida parapsilosis NOT DETECTED NOT DETECTED Final   Candida tropicalis NOT DETECTED NOT DETECTED Final   Cryptococcus neoformans/gattii NOT DETECTED NOT DETECTED Final    Comment: Performed at [facility], [address] [place], [place] [address]  Blood culture (routine x 2)     Status: None (Preliminary result)   Collection Time: 05/20/22  1:51 PM   Specimen: Left Antecubital; Blood  Result Value Ref Range Status   Specimen Description LEFT ANTECUBITAL  Final   Special Requests   Final    BOTTLES DRAWN AEROBIC AND ANAEROBIC Blood Culture adequate volume   Culture   Final    NO GROWTH 4 DAYS Performed at [facility], [address] [place], [place] [address]    Report Status PENDING  Incomplete  Culture, blood (Routine X 2) w Reflex to ID Panel     Status: None (Preliminary result)   Collection  Time: 05/21/22 11:09 AM   Specimen: BLOOD RIGHT ARM  Result Value Ref Range Status   Specimen Description BLOOD RIGHT ARM  Final   Special Requests   Final    BOTTLES DRAWN AEROBIC AND ANAEROBIC Blood Culture adequate volume   Culture   Final    NO GROWTH 3 DAYS Performed at [facility], [address] [place], [place] [address]    Report Status PENDING  Incomplete  Culture, blood (Routine X 2) w Reflex to ID Panel     Status: None (Preliminary result)   Collection Time: 05/21/22 11:17 AM   Specimen: BLOOD LEFT HAND  Result Value Ref Range Status   Specimen Description BLOOD LEFT HAND  Final   Special Requests   Final    BOTTLES DRAWN AEROBIC AND ANAEROBIC Blood Culture adequate volume   Culture   Final    NO GROWTH 3 DAYS Performed at [facility], [address] [place], [place] [address]    Report Status PENDING  Incomplete  MRSA Next Gen by PCR, Nasal     Status: Abnormal   Collection Time: 05/21/22  1:26 PM   Specimen: Nasal Mucosa; Nasal Swab  Result Value Ref Range Status   MRSA by PCR Next Gen DETECTED (A) NOT DETECTED Final    Comment: RESULT CALLED TO, READ BACK BY AND VERIFIED WITH: ROBIN THOMAS AT 3329 05/21/22.PMF (NOTE) The GeneXpert MRSA Assay (FDA approved for NASAL specimens only), is one component of a comprehensive MRSA colonization surveillance program. It is not intended to diagnose MRSA infection nor to guide or monitor treatment for MRSA infections. Test performance is not FDA approved in patients less than 39 years old. Performed at [facility] [address] [place],  [address]      Labs: BNP (last 3 results) Recent Labs    05/20/22 1351  BNP 166.0*   Basic Metabolic Panel: Recent Labs  Lab 05/20/22 1123 05/20/22 1351 05/21/22 0450 05/22/22 0423  05/23/22 0628  NA 137  --  135 138 138  K 3.7  --  3.5 3.9 3.5  CL 100  --  106 106 102  CO2 28  --  '26 28 26  '$ GLUCOSE 125*  --  97 92 110*  BUN 14   --  12 10 7*  CREATININE 0.76  --  0.57 0.66 0.60  CALCIUM 8.3*  --  7.8* 8.1* 8.1*  MG  --  1.6*  --  1.8 1.7  PHOS  --  3.2  --   --   --    Liver Function Tests: Recent Labs  Lab 05/20/22 1123  AST 56*  ALT 19  ALKPHOS 99  BILITOT 1.0  PROT 7.5  ALBUMIN 3.5   No results for input(s): "LIPASE", "AMYLASE" in the last 168 hours. No results for input(s): "AMMONIA" in the last 168 hours. CBC: Recent Labs  Lab 05/20/22 1123 05/21/22 0450 05/22/22 0423  WBC 14.5* 13.6* 10.3  NEUTROABS 12.0*  --   --   HGB 14.2 12.7 11.4*  HCT 43.4 37.4 34.0*  MCV 92.1 88.8 89.2  PLT 225 197 196   Cardiac Enzymes: Recent Labs  Lab 05/20/22 1351  CKTOTAL 32*   BNP: Invalid input(s): "POCBNP" CBG: No results for input(s): "GLUCAP" in the last 168 hours. D-Dimer No results for input(s): "DDIMER" in the last 72 hours. Hgb A1c No results for input(s): "HGBA1C" in the last 72 hours. Lipid Profile No results for input(s): "CHOL", "HDL", "LDLCALC", "TRIG", "CHOLHDL", "LDLDIRECT" in the last 72 hours. Thyroid function studies No results for input(s): "TSH", "T4TOTAL", "T3FREE", "THYROIDAB" in the last 72 hours.  Invalid input(s): "FREET3" Anemia work up No results for input(s): "VITAMINB12", "FOLATE", "FERRITIN", "TIBC", "IRON", "RETICCTPCT" in the last 72 hours. Urinalysis    Component Value Date/Time   COLORURINE YELLOW (A) 05/20/2022 1123   APPEARANCEUR CLOUDY (A) 05/20/2022 1123   APPEARANCEUR Hazy 05/27/2013 1107   LABSPEC 1.016 05/20/2022 1123   LABSPEC 1.011 05/27/2013 1107   PHURINE 5.0 05/20/2022 1123   GLUCOSEU NEGATIVE 05/20/2022 1123   GLUCOSEU Negative 05/27/2013 1107   HGBUR LARGE (A) 05/20/2022 1123   BILIRUBINUR NEGATIVE 05/20/2022 1123   BILIRUBINUR Negative 05/27/2013 1107   KETONESUR NEGATIVE 05/20/2022 1123   PROTEINUR 30 (A) 05/20/2022 1123   NITRITE NEGATIVE 05/20/2022 1123   LEUKOCYTESUR LARGE (A) 05/20/2022 1123   LEUKOCYTESUR 1+ 05/27/2013 1107    Sepsis Labs Recent Labs  Lab 05/20/22 1123 05/21/22 0450 05/22/22 0423  WBC 14.5* 13.6* 10.3   Microbiology Recent Results (from the past 240 hour(s))  Urine Culture     Status: Abnormal   Collection Time: 05/20/22 11:23 AM   Specimen: Urine, Clean Catch  Result Value Ref Range Status   Specimen Description   Final    URINE, CLEAN CATCH Performed at [facility], [address] [place], [place] [address]    Special Requests   Final    NONE Performed at [facility], [address] [place], [place] [address]    Culture >=100,000 COLONIES/mL KLEBSIELLA PNEUMONIAE (A)  Final   Report Status 05/22/2022 FINAL  Final   Organism ID, Bacteria KLEBSIELLA PNEUMONIAE (A)  Final      Susceptibility   Klebsiella pneumoniae - MIC*    AMPICILLIN RESISTANT Resistant     CEFAZOLIN <=4 SENSITIVE Sensitive     CEFEPIME <=0.12 SENSITIVE Sensitive     CEFTRIAXONE <=0.25 SENSITIVE Sensitive     CIPROFLOXACIN <=0.25 SENSITIVE Sensitive  GENTAMICIN <=1 SENSITIVE Sensitive     IMIPENEM <=0.25 SENSITIVE Sensitive     NITROFURANTOIN 64 INTERMEDIATE Intermediate     TRIMETH/SULFA >=320 RESISTANT Resistant     AMPICILLIN/SULBACTAM 4 SENSITIVE Sensitive     PIP/TAZO <=4 SENSITIVE Sensitive     * >=100,000 COLONIES/mL KLEBSIELLA PNEUMONIAE  SARS Coronavirus 2 by RT PCR (hospital order, performed in [place] hospital lab) *cepheid single result test* Anterior Nasal Swab     Status: None   Collection Time: 05/20/22 11:23 AM   Specimen: Anterior Nasal Swab  Result Value Ref Range Status   SARS Coronavirus 2 by RT PCR NEGATIVE NEGATIVE Final    Comment: (NOTE) SARS-CoV-2 target nucleic acids are NOT DETECTED.  The SARS-CoV-2 RNA is generally detectable in upper and lower respiratory specimens during the acute phase of infection. The lowest concentration of SARS-CoV-2 viral copies this assay can detect is 250 copies / mL. A negative result does not preclude SARS-CoV-2  infection and should not be used as the sole basis for treatment or other patient management decisions.  A negative result may occur with improper specimen collection / handling, submission of specimen other than nasopharyngeal swab, presence of viral mutation(s) within the areas targeted by this assay, and inadequate number of viral copies (<250 copies / mL). A negative result must be combined with clinical observations, patient history, and epidemiological information.  Fact Sheet for Patients:   https://www.patel.info/  Fact Sheet for Healthcare Providers: https://hall.com/  This test is not yet approved or  cleared by the [place] FDA and has been authorized for detection and/or diagnosis of SARS-CoV-2 by FDA under an Emergency Use Authorization (EUA).  This EUA will remain in effect (meaning this test can be used) for the duration of the COVID-19 declaration under Section 564(b)(1) of the Act, 21 U.S.C. section 360bbb-3(b)(1), unless the authorization is terminated or revoked sooner.  Performed at [facility], [address] [place], [place] [address]   Blood culture (routine x 2)     Status: Abnormal   Collection Time: 05/20/22  1:30 PM   Specimen: BLOOD  Result Value Ref Range Status   Specimen Description   Final    BLOOD LEFT ANTECUBITAL Performed at [facility], [address] [place], [place] [address]    Special Requests   Final    BOTTLES DRAWN AEROBIC AND ANAEROBIC Blood Culture adequate volume Performed at [facility], [address] [place], [place] [address]    Culture  Setup Time   Final    GRAM POSITIVE COCCI IN BOTH AEROBIC AND ANAEROBIC BOTTLES CRITICAL RESULT CALLED TO, READ BACK BY AND VERIFIED WITH: BRANDON BEERS AT 0258 05/21/22.PMF    Culture (A)  Final    STAPHYLOCOCCUS CAPITIS THE SIGNIFICANCE OF ISOLATING THIS ORGANISM FROM A SINGLE VENIPUNCTURE CANNOT BE  PREDICTED WITHOUT FURTHER CLINICAL AND CULTURE CORRELATION. SUSCEPTIBILITIES AVAILABLE ONLY ON REQUEST. Performed at [facility], [place] [address] [place], [place] [address]    Report Status 05/23/2022 FINAL  Final  Blood Culture ID Panel (Reflexed)     Status: Abnormal   Collection Time: 05/20/22  1:30 PM  Result Value Ref Range Status   Enterococcus faecalis NOT DETECTED NOT DETECTED Final   Enterococcus Faecium NOT DETECTED NOT DETECTED Final   Listeria monocytogenes NOT DETECTED NOT DETECTED Final   Staphylococcus species DETECTED (A) NOT DETECTED Final    Comment: CRITICAL RESULT CALLED TO, READ BACK BY AND VERIFIED WITH: BRANDON BEERS AT [phone]  05/21/22.PMF    Staphylococcus aureus (BCID) NOT DETECTED NOT DETECTED Final   Staphylococcus epidermidis NOT DETECTED NOT DETECTED Final   Staphylococcus lugdunensis NOT DETECTED NOT DETECTED Final   Streptococcus species NOT DETECTED NOT DETECTED Final   Streptococcus agalactiae NOT DETECTED NOT DETECTED Final   Streptococcus pneumoniae NOT DETECTED NOT DETECTED Final   Streptococcus pyogenes NOT DETECTED NOT DETECTED Final   A.calcoaceticus-baumannii NOT DETECTED NOT DETECTED Final   Bacteroides fragilis NOT DETECTED NOT DETECTED Final   Enterobacterales NOT DETECTED NOT DETECTED Final   Enterobacter cloacae complex NOT DETECTED NOT DETECTED Final   Escherichia coli NOT DETECTED NOT DETECTED Final   Klebsiella aerogenes NOT DETECTED NOT DETECTED Final   Klebsiella oxytoca NOT DETECTED NOT DETECTED Final   Klebsiella pneumoniae NOT DETECTED NOT DETECTED Final   Proteus species NOT DETECTED NOT DETECTED Final   Salmonella species NOT DETECTED NOT DETECTED Final   Serratia marcescens NOT DETECTED NOT DETECTED Final   Haemophilus influenzae NOT DETECTED NOT DETECTED Final   Neisseria meningitidis NOT DETECTED NOT DETECTED Final   Pseudomonas aeruginosa NOT DETECTED NOT DETECTED Final   Stenotrophomonas maltophilia NOT DETECTED NOT  DETECTED Final   Candida albicans NOT DETECTED NOT DETECTED Final   Candida auris NOT DETECTED NOT DETECTED Final   Candida glabrata NOT DETECTED NOT DETECTED Final   Candida krusei NOT DETECTED NOT DETECTED Final   Candida parapsilosis NOT DETECTED NOT DETECTED Final   Candida tropicalis NOT DETECTED NOT DETECTED Final   Cryptococcus neoformans/gattii NOT DETECTED NOT DETECTED Final    Comment: Performed at [facility], [address] [place], [place] [address]  Blood culture (routine x 2)     Status: None (Preliminary result)   Collection Time: 05/20/22  1:51 PM   Specimen: Left Antecubital; Blood  Result Value Ref Range Status   Specimen Description LEFT ANTECUBITAL  Final   Special Requests   Final    BOTTLES DRAWN AEROBIC AND ANAEROBIC Blood Culture adequate volume   Culture   Final    NO GROWTH 4 DAYS Performed at [facility], [address] [place], [place] [address]    Report Status PENDING  Incomplete  Culture, blood (Routine X 2) w Reflex to ID Panel     Status: None (Preliminary result)   Collection Time: 05/21/22 11:09 AM   Specimen: BLOOD RIGHT ARM  Result Value Ref Range Status   Specimen Description BLOOD RIGHT ARM  Final   Special Requests   Final    BOTTLES DRAWN AEROBIC AND ANAEROBIC Blood Culture adequate volume   Culture   Final    NO GROWTH 3 DAYS Performed at [facility], [address] [place], [place] [address]    Report Status PENDING  Incomplete  Culture, blood (Routine X 2) w Reflex to ID Panel     Status: None (Preliminary result)   Collection Time: 05/21/22 11:17 AM   Specimen: BLOOD LEFT HAND  Result Value Ref Range Status   Specimen Description BLOOD LEFT HAND  Final   Special Requests   Final    BOTTLES DRAWN AEROBIC AND ANAEROBIC Blood Culture adequate volume   Culture   Final    NO GROWTH 3 DAYS Performed at [facility], [address] [place],  [address]    Report Status PENDING   Incomplete  MRSA Next Gen by PCR, Nasal     Status: Abnormal   Collection Time: 05/21/22  1:26 PM   Specimen: Nasal Mucosa; Nasal  Swab  Result Value Ref Range Status   MRSA by PCR Next Gen DETECTED (A) NOT DETECTED Final    Comment: RESULT CALLED TO, READ BACK BY AND VERIFIED WITH: ROBIN THOMAS AT 6384 05/21/22.PMF (NOTE) The GeneXpert MRSA Assay (FDA approved for NASAL specimens only), is one component of a comprehensive MRSA colonization surveillance program. It is not intended to diagnose MRSA infection nor to guide or monitor treatment for MRSA infections. Test performance is not FDA approved in patients less than 33 years old. Performed at [facility], [address], [place], [place] [address]     Please note: You were cared for by a hospitalist during your hospital stay. Once you are discharged, your primary care physician will handle any further medical issues. Please note that NO REFILLS for any discharge medications will be authorized once you are discharged, as it is imperative that you return to your primary care physician (or establish a relationship with a primary care physician if you do not have one) for your post hospital discharge needs so that they can reassess your need for medications and monitor your lab values.    Time coordinating discharge: 40 minutes  SIGNED:   Shelly Coss, MD  [facility] 05/24/2022, 1:10 PM Pager [phone]  If 7PM-7AM, please contact night-coverage [email or web address] Password [email or web address]

## 2022-05-24 NOTE — TOC Progression Note (Signed)
Transition of Care [facility]) - Progression Note    Patient Details  Name: Suzanne Dixon MRN: [id number] Date of Birth: 10-06-1950  [facility] CM/SW Contact  Laurena Slimmer, RN Phone Number: 05/24/2022, 10:40 AM  Clinical Narrative:    Spoke with patient about her discharge plan. Patient not agreeable to SNF recommendation. She stated she was ctive with [facility] prior to this admission and is agreeable to [facility]. Patient has used [facility] in the past. Contacted [facility] to confirm if patient is still active. Patient's friend will transport her home.   [facility] confirmed patient is active with PT.     Expected Discharge Plan and Services                                                 Social Determinants of Health (SDOH) Interventions    Readmission Risk Interventions    06/21/2021    4:12 PM  Readmission Risk Prevention Plan  Transportation Screening Complete  PCP or Specialist Appt within 3-5 Days Complete  HRI or [place] Complete  Social Work Consult for [place] Planning/Counseling Complete  Palliative Care Screening Not Applicable  Medication Review [employer]) Complete

## 2022-05-25 LAB — CULTURE, BLOOD (ROUTINE X 2)
Culture: NO GROWTH
Special Requests: ADEQUATE

## 2022-05-26 LAB — CULTURE, BLOOD (ROUTINE X 2)
Culture: NO GROWTH
Culture: NO GROWTH
Special Requests: ADEQUATE
Special Requests: ADEQUATE

## 2022-05-30 DIAGNOSIS — G20C Parkinsonism, unspecified: Secondary | ICD-10-CM | POA: Diagnosis present

## 2022-06-19 ENCOUNTER — Encounter: Payer: Self-pay | Admitting: Emergency Medicine

## 2022-06-19 ENCOUNTER — Emergency Department: Payer: Medicare HMO

## 2022-06-19 ENCOUNTER — Other Ambulatory Visit: Payer: Self-pay

## 2022-06-19 ENCOUNTER — Observation Stay
Admission: EM | Admit: 2022-06-19 | Discharge: 2022-06-21 | Disposition: A | Discharge status: Home Health | Payer: Medicare HMO | Attending: Internal Medicine | Admitting: Internal Medicine

## 2022-06-19 DIAGNOSIS — E039 Hypothyroidism, unspecified: Secondary | ICD-10-CM | POA: Diagnosis present

## 2022-06-19 DIAGNOSIS — F331 Major depressive disorder, recurrent, moderate: Secondary | ICD-10-CM | POA: Diagnosis present

## 2022-06-19 DIAGNOSIS — Z96611 Presence of right artificial shoulder joint: Secondary | ICD-10-CM | POA: Diagnosis not present

## 2022-06-19 DIAGNOSIS — G9341 Metabolic encephalopathy: Secondary | ICD-10-CM | POA: Diagnosis not present

## 2022-06-19 DIAGNOSIS — R4182 Altered mental status, unspecified: Secondary | ICD-10-CM | POA: Diagnosis present

## 2022-06-19 DIAGNOSIS — I10 Essential (primary) hypertension: Secondary | ICD-10-CM | POA: Diagnosis not present

## 2022-06-19 DIAGNOSIS — I480 Paroxysmal atrial fibrillation: Secondary | ICD-10-CM | POA: Diagnosis not present

## 2022-06-19 DIAGNOSIS — R600 Localized edema: Secondary | ICD-10-CM | POA: Diagnosis not present

## 2022-06-19 DIAGNOSIS — R410 Disorientation, unspecified: Secondary | ICD-10-CM

## 2022-06-19 DIAGNOSIS — Z79899 Other long term (current) drug therapy: Secondary | ICD-10-CM | POA: Insufficient documentation

## 2022-06-19 DIAGNOSIS — N3090 Cystitis, unspecified without hematuria: Secondary | ICD-10-CM | POA: Diagnosis not present

## 2022-06-19 DIAGNOSIS — A419 Sepsis, unspecified organism: Secondary | ICD-10-CM | POA: Diagnosis present

## 2022-06-19 DIAGNOSIS — G20C Parkinsonism, unspecified: Secondary | ICD-10-CM | POA: Diagnosis not present

## 2022-06-19 DIAGNOSIS — Z8673 Personal history of transient ischemic attack (TIA), and cerebral infarction without residual deficits: Secondary | ICD-10-CM | POA: Diagnosis not present

## 2022-06-19 DIAGNOSIS — Z853 Personal history of malignant neoplasm of breast: Secondary | ICD-10-CM | POA: Diagnosis not present

## 2022-06-19 DIAGNOSIS — Z20822 Contact with and (suspected) exposure to covid-19: Secondary | ICD-10-CM | POA: Diagnosis not present

## 2022-06-19 DIAGNOSIS — Z7901 Long term (current) use of anticoagulants: Secondary | ICD-10-CM | POA: Diagnosis not present

## 2022-06-19 DIAGNOSIS — L8990 Pressure ulcer of unspecified site, unspecified stage: Secondary | ICD-10-CM | POA: Insufficient documentation

## 2022-06-19 DIAGNOSIS — I639 Cerebral infarction, unspecified: Secondary | ICD-10-CM | POA: Diagnosis present

## 2022-06-19 DIAGNOSIS — M109 Gout, unspecified: Secondary | ICD-10-CM | POA: Diagnosis present

## 2022-06-19 DIAGNOSIS — G4730 Sleep apnea, unspecified: Secondary | ICD-10-CM | POA: Diagnosis present

## 2022-06-19 LAB — COMPREHENSIVE METABOLIC PANEL
ALT: 19 U/L (ref 0–44)
AST: 29 U/L (ref 15–41)
Albumin: 3.4 g/dL — ABNORMAL LOW (ref 3.5–5.0)
Alkaline Phosphatase: 77 U/L (ref 38–126)
Anion gap: 12 (ref 5–15)
BUN: 16 mg/dL (ref 8–23)
CO2: 23 mmol/L (ref 22–32)
Calcium: 8.6 mg/dL — ABNORMAL LOW (ref 8.9–10.3)
Chloride: 101 mmol/L (ref 98–111)
Creatinine, Ser: 0.62 mg/dL (ref 0.44–1.00)
GFR, Estimated: >60 mL/min (ref >60)
Glucose, Bld: 92 mg/dL (ref 70–99)
Potassium: 3.6 mmol/L (ref 3.5–5.1)
Sodium: 136 mmol/L (ref 135–145)
Total Bilirubin: 1.2 mg/dL (ref 0.3–1.2)
Total Protein: 7.3 g/dL (ref 6.5–8.1)

## 2022-06-19 LAB — URINALYSIS, COMPLETE (UACMP) WITH MICROSCOPIC
Bilirubin Urine: NEGATIVE
Glucose, UA: NEGATIVE mg/dL
Ketones, ur: 5 mg/dL — AB
Nitrite: NEGATIVE
Protein, ur: 30 mg/dL — AB
Specific Gravity, Urine: 1.019 (ref 1.005–1.030)
WBC, UA: >50 WBC/hpf — ABNORMAL HIGH (ref 0–5)
pH: 5 (ref 5.0–8.0)

## 2022-06-19 LAB — CBC WITH DIFFERENTIAL/PLATELET
Abs Immature Granulocytes: 0.04 10*3/uL (ref 0.00–0.07)
Basophils Absolute: 0.1 10*3/uL (ref 0.0–0.1)
Basophils Relative: 0 %
Eosinophils Absolute: 0 10*3/uL (ref 0.0–0.5)
Eosinophils Relative: 0 %
HCT: 38 % (ref 36.0–46.0)
Hemoglobin: 13 g/dL (ref 12.0–15.0)
Immature Granulocytes: 0 %
Lymphocytes Relative: 30 %
Lymphs Abs: 3.9 10*3/uL (ref 0.7–4.0)
MCH: 30.4 pg (ref 26.0–34.0)
MCHC: 34.2 g/dL (ref 30.0–36.0)
MCV: 88.8 fL (ref 80.0–100.0)
Monocytes Absolute: 1.2 10*3/uL — ABNORMAL HIGH (ref 0.1–1.0)
Monocytes Relative: 9 %
Neutro Abs: 7.9 10*3/uL — ABNORMAL HIGH (ref 1.7–7.7)
Neutrophils Relative %: 61 %
Platelets: 225 10*3/uL (ref 150–400)
RBC: 4.28 MIL/uL (ref 3.87–5.11)
RDW: 14.2 % (ref 11.5–15.5)
WBC: 13.1 10*3/uL — ABNORMAL HIGH (ref 4.0–10.5)
nRBC: 0 % (ref 0.0–0.2)

## 2022-06-19 LAB — PROTIME-INR
INR: 2.2 — ABNORMAL HIGH (ref 0.8–1.2)
Prothrombin Time: 24.1 seconds — ABNORMAL HIGH (ref 11.4–15.2)

## 2022-06-19 LAB — APTT: aPTT: 42 seconds — ABNORMAL HIGH (ref 24–36)

## 2022-06-19 LAB — RESP PANEL BY RT-PCR (FLU A&B, COVID) ARPGX2
Influenza A by PCR: NEGATIVE
Influenza B by PCR: NEGATIVE
SARS Coronavirus 2 by RT PCR: NEGATIVE

## 2022-06-19 LAB — LACTIC ACID, PLASMA: Lactic Acid, Venous: 1.7 mmol/L (ref 0.5–1.9)

## 2022-06-19 LAB — BRAIN NATRIURETIC PEPTIDE: B Natriuretic Peptide: 164.7 pg/mL — ABNORMAL HIGH (ref 0.0–100.0)

## 2022-06-19 LAB — PROCALCITONIN: Procalcitonin: <0.1 ng/mL

## 2022-06-19 MED ORDER — SODIUM CHLORIDE 0.9% FLUSH
3.0000 mL | Freq: Two times a day (BID) | INTRAVENOUS | Status: DC
  Administered 2022-06-19 – 2022-06-21 (×3): 3 mL via INTRAVENOUS

## 2022-06-19 MED ORDER — GABAPENTIN 600 MG PO TABS
300.0000 mg | ORAL_TABLET | Freq: Three times a day (TID) | ORAL | Status: DC
  Administered 2022-06-19 – 2022-06-21 (×5): 300 mg via ORAL
  Filled 2022-06-19 (×5): qty 1

## 2022-06-19 MED ORDER — ACETAMINOPHEN 650 MG RE SUPP: 650.0000 mg | Freq: Four times a day (QID) | RECTAL | Status: DC | PRN

## 2022-06-19 MED ORDER — SODIUM CHLORIDE 0.9 % IV BOLUS (SEPSIS)
1000.0000 mL | Freq: Once | INTRAVENOUS | Status: AC
  Administered 2022-06-19: 1000 mL via INTRAVENOUS

## 2022-06-19 MED ORDER — MORPHINE SULFATE (PF) 2 MG/ML IV SOLN: 2.0000 mg | INTRAVENOUS | Status: DC | PRN

## 2022-06-19 MED ORDER — METOPROLOL TARTRATE 50 MG PO TABS
100.0000 mg | ORAL_TABLET | Freq: Two times a day (BID) | ORAL | Status: DC
  Administered 2022-06-19 – 2022-06-21 (×4): 100 mg via ORAL
  Filled 2022-06-19 (×4): qty 2

## 2022-06-19 MED ORDER — ACETAMINOPHEN 325 MG PO TABS: 650.0000 mg | ORAL_TABLET | Freq: Four times a day (QID) | ORAL | Status: DC | PRN

## 2022-06-19 MED ORDER — INFLUENZA VAC A&B SA ADJ QUAD 0.5 ML IM PRSY
0.5000 mL | PREFILLED_SYRINGE | INTRAMUSCULAR | Status: DC
  Filled 2022-06-19: qty 0.5

## 2022-06-19 MED ORDER — METOPROLOL TARTRATE 50 MG PO TABS
100.0000 mg | ORAL_TABLET | ORAL | Status: AC
  Administered 2022-06-19: 100 mg via ORAL
  Filled 2022-06-19: qty 2

## 2022-06-19 MED ORDER — HYDROCODONE-ACETAMINOPHEN 5-325 MG PO TABS: 1.0000 | ORAL_TABLET | ORAL | Status: DC | PRN

## 2022-06-19 MED ORDER — LEVOTHYROXINE SODIUM 50 MCG PO TABS
50.0000 ug | ORAL_TABLET | Freq: Every day | ORAL | Status: DC
  Administered 2022-06-20 – 2022-06-21 (×2): 50 ug via ORAL
  Filled 2022-06-19 (×2): qty 1

## 2022-06-19 MED ORDER — SODIUM CHLORIDE 0.9 % IV SOLN
2.0000 g | Freq: Once | INTRAVENOUS | Status: AC
  Administered 2022-06-19: 2 g via INTRAVENOUS
  Filled 2022-06-19: qty 12.5

## 2022-06-19 MED ORDER — WARFARIN - PHARMACIST DOSING INPATIENT
Freq: Every day | Status: DC
  Filled 2022-06-19: qty 1

## 2022-06-19 MED ORDER — CARBIDOPA-LEVODOPA 25-100 MG PO TABS
1.5000 | ORAL_TABLET | Freq: Three times a day (TID) | ORAL | Status: DC
  Administered 2022-06-19 – 2022-06-21 (×5): 1.5 via ORAL
  Filled 2022-06-19 (×4): qty 2
  Filled 2022-06-19: qty 1.5
  Filled 2022-06-19: qty 2

## 2022-06-19 MED ORDER — ALLOPURINOL 300 MG PO TABS
300.0000 mg | ORAL_TABLET | Freq: Every day | ORAL | Status: DC
  Administered 2022-06-20 – 2022-06-21 (×2): 300 mg via ORAL
  Filled 2022-06-19 (×2): qty 1

## 2022-06-19 MED ORDER — LACTATED RINGERS IV SOLN: INTRAVENOUS | Status: AC

## 2022-06-19 MED ORDER — PANTOPRAZOLE SODIUM 40 MG PO TBEC
40.0000 mg | DELAYED_RELEASE_TABLET | Freq: Every day | ORAL | Status: DC
  Administered 2022-06-20 – 2022-06-21 (×2): 40 mg via ORAL
  Filled 2022-06-19 (×2): qty 1

## 2022-06-19 MED ORDER — WARFARIN SODIUM 6 MG PO TABS
6.0000 mg | ORAL_TABLET | Freq: Once | ORAL | Status: AC
  Administered 2022-06-20: 6 mg via ORAL
  Filled 2022-06-19 (×2): qty 1

## 2022-06-19 MED ORDER — DULOXETINE HCL 30 MG PO CPEP
60.0000 mg | ORAL_CAPSULE | Freq: Two times a day (BID) | ORAL | Status: DC
  Administered 2022-06-19 – 2022-06-21 (×4): 60 mg via ORAL
  Filled 2022-06-19 (×4): qty 2

## 2022-06-19 NOTE — Assessment & Plan Note (Signed)
Vitals:   06/19/22 1549 06/19/22 1800 06/19/22 1900 06/19/22 1945  BP: (!) 148/96 (!) 206/105 (!) 152/112 (!) 152/47  Will continue patient on her home regimen with metoprolol. We will refrain from using NSAIDs which will further potentiate hypertension. Patient is on meloxicam at home. In the meantime we will also add as needed hydralazine.

## 2022-06-19 NOTE — Sepsis Progress Note (Signed)
Sepsis protocol followed by [facility] 

## 2022-06-19 NOTE — ED Notes (Signed)
Dr. Joni Fears at bedside of evaluation.

## 2022-06-19 NOTE — ED Triage Notes (Signed)
Pt via EMS from home. Pt's home health nurse called for altered mental status, foul smelling odor to her urine, and incontinence. Pt is altered and only oriented to self. Pt has  hx of arthritis and complaining of pain all over. VSS per EMS.

## 2022-06-19 NOTE — Assessment & Plan Note (Signed)
EKG today is irregular and patient is in sinus rhythm. We will continue patient on her Lopressor and warfarin.

## 2022-06-19 NOTE — Consult Note (Signed)
ANTICOAGULATION CONSULT NOTE - Initial Consult  Pharmacy Consult for warfarin Indication: atrial fibrillation  Allergies  Allergen Reactions   Advair Diskus [Fluticasone-Salmeterol] Other (See Comments)    Joint pain    Alendronate     Muscle pain   Singulair [Montelukast] Other (See Comments)    "muscle pain"   Sulfa Antibiotics Hives   Venlafaxine Other (See Comments)    "altered mental status/tremors"    Patient Measurements: Height: '5\' 4"'$  (162.6 cm) Weight: 106.1 kg (234 lb) IBW/kg (Calculated) : 54.7   Vital Signs: Temp: 98.8 F (37.1 C) (10/01 2100) Temp Source: Oral (10/01 2100) BP: 141/80 (10/01 2100) Pulse Rate: 87 (10/01 2100)  Labs: Recent Labs    06/19/22 1603  HGB 13.0  HCT 38.0  PLT 225  APTT 42*  LABPROT 24.1*  INR 2.2*  CREATININE 0.62    Estimated Creatinine Clearance: 74.5 mL/min (by C-G formula based on SCr of 0.62 mg/dL).   Medical History: Past Medical History:  Diagnosis Date   A-fib ([place])    Anemia    B12 deficiency    Breast cancer ([place]) 03/31/2013   Right - chemo- mastectomy   Breast cancer ([place]) 1991   Rt.- radiation   Complication of anesthesia 01/2009   Recent years with general anesthesia had itching following surgery   DDD (degenerative disc disease), cervical    Depression    Diverticulitis    Dyspnea    Edema of both legs    GERD (gastroesophageal reflux disease)    Gout    H/O mastectomy 2014   per patient   H/O: cesarean section 1987   per patient report   H/O: hysterectomy 1996   per patient   Hydradenitis    Hypertension    Hypothyroidism    Neuropathy    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 female with a history of paroxysmal atrial fibrillation, gout, hypertension, stroke, parkinsonism, morbid obesity present with confusion. Pharmacy consulted for management of warfarin given history of atrial fibrillation  Goal of Therapy:  INR 2-3 Monitor platelets by anticoagulation protocol: Yes  Date INR Dose  10/1 2.2 6 mg        Plan:  Give Warfarin '6mg'$  PO x 1 - home regimen CBC and INR daily with AM labs    Darrick Penna 06/19/2022,10:31 PM

## 2022-06-19 NOTE — H&P (Signed)
History and Physical    Chief Complaint: AMS   HISTORY OF PRESENT ILLNESS: Suzanne Dixon is an 71 y.o. female coming to [place] today for altered mental status noted by home health nurse. Patient had a similar admission about a month ago for confusion and found to have urinary tract infection then as well.   Pt has PMH as below: Past Medical History:  Diagnosis Date   A-fib ([place])    Anemia    B12 deficiency    Breast cancer ([place]) 03/31/2013   Right - chemo- mastectomy   Breast cancer ([place]) 1991   Rt.- radiation   Complication of anesthesia 01/2009   Recent years with general anesthesia had itching following surgery   DDD (degenerative disc disease), cervical    Depression    Diverticulitis    Dyspnea    Edema of both legs    GERD (gastroesophageal reflux disease)    Gout    H/O mastectomy 2014   per patient   H/O: cesarean section 1987   per patient report   H/O: hysterectomy 1996   per patient   Hydradenitis    Hypertension    Hypothyroidism    Neuropathy    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   HENT: Negative.    Respiratory: Negative.    Cardiovascular: Negative.   Gastrointestinal: Negative.   Genitourinary: Negative.   Musculoskeletal:        Left leg pain that is chronic per patient no erythema or swelling and pt on coumadin for her a.fib.   Neurological:  Positive for weakness.       Weakness at baseline. Pt is severely deconditioned.  Has PT at home and needs to be resumed.     All other systems reviewed and are negative.    ALLERGIES: Allergies  Allergen Reactions   Advair Diskus [Fluticasone-Salmeterol] Other (See Comments)    Joint pain    Alendronate     Muscle pain    Singulair [Montelukast] Other (See Comments)    "muscle pain"   Sulfa Antibiotics Hives   Venlafaxine Other (See Comments)    "altered mental status/tremors"    PAST SURGICAL HISTORY: Past Surgical History:  Procedure Laterality Date   ABDOMINAL HYSTERECTOMY     BACK SURGERY  2002   plate and 2 screws in neck   BREAST BIOPSY Right 1991,2014   Positive   BREAST CYST ASPIRATION Left    BREAST SURGERY Right 2014   mastectomy   CATARACT EXTRACTION W/PHACO Left 05/17/2019   Procedure: CATARACT EXTRACTION PHACO AND INTRAOCULAR LENS PLACEMENT ([place]);  Surgeon: Eulogio Bear, MD;  Location: [facility] ORS;  Service: Ophthalmology;  Laterality: Left;  [place] 00:28 CDE 2.15 FLUID PACK LOT # [id number]   CATARACT EXTRACTION W/PHACO Right 06/14/2019   Procedure: CATARACT EXTRACTION PHACO AND INTRAOCULAR LENS PLACEMENT ([facility]) RIGHT;  Surgeon: Eulogio Bear, MD;  Location: [facility] ORS;  Service: Ophthalmology;  Laterality: Right;  [place] 00:35.0 CDE 2.37 Fluid Pack Lot #[id number]   CESAREAN SECTION     CHOLECYSTECTOMY  04/09/2012   COLONOSCOPY WITH PROPOFOL N/A 06/22/2015   Procedure: COLONOSCOPY WITH PROPOFOL;  Surgeon:  Manya Silvas, MD;  Location: [facility] ENDOSCOPY;  Service: Endoscopy;  Laterality: N/A;   COLONOSCOPY WITH PROPOFOL N/A 05/25/2021   Procedure: COLONOSCOPY WITH PROPOFOL;  Surgeon: Lesly Rubenstein, MD;  Location: [facility] ENDOSCOPY;  Service: Endoscopy;  Laterality: N/A;   ESOPHAGOGASTRODUODENOSCOPY     ESOPHAGOGASTRODUODENOSCOPY N/A 05/25/2021   Procedure: ESOPHAGOGASTRODUODENOSCOPY (EGD);  Surgeon: Lesly Rubenstein, MD;  Location: [facility] ENDOSCOPY;  Service: Endoscopy;  Laterality: N/A;   EYE SURGERY     LAPAROSCOPIC GASTRIC BYPASS     MASTECTOMY Right    MASTECTOMY MODIFIED RADICAL     mastectomy partial      lumpectomy   NEUROPLASTY / TRANSPOSITION ULNAR NERVE AT ELBOW     OOPHORECTOMY     PORT-A-CATH REMOVAL Left 05/13/2015   Procedure: REMOVAL PORT-A-CATH LEFT CHEST ;  Surgeon:  Florene Glen, MD;  Location: [facility] ORS;  Service: General;  Laterality: Left;   REDUCTION MAMMAPLASTY Left    REVERSE SHOULDER ARTHROPLASTY Right 12/03/2020   Procedure: REVERSE SHOULDER ARTHROPLASTY;  Surgeon: Corky Mull, MD;  Location: [facility] ORS;  Service: Orthopedics;  Laterality: Right;   ROUX-EN-Y GASTRIC BYPASS  01/26/2009   SHOULDER ARTHROSCOPY WITH OPEN ROTATOR CUFF REPAIR Right 01/03/2017   Procedure: SHOULDER ARTHROSCOPY WITH OPEN ROTATOR CUFF REPAIR;  Surgeon: Corky Mull, MD;  Location: [facility] ORS;  Service: Orthopedics;  Laterality: Right;   SHOULDER ARTHROSCOPY WITH SUBACROMIAL DECOMPRESSION, ROTATOR CUFF REPAIR AND BICEP TENDON REPAIR Right 01/03/2017   Procedure: SHOULDER ARTHROSCOPY WITH SUBACROMIAL DECOMPRESSION, ROTATOR CUFF REPAIR AND BICEP TENDON REPAIR;  Surgeon: Corky Mull, MD;  Location: [facility] ORS;  Service: Orthopedics;  Laterality: Right;  Limited debridement   SPINE SURGERY       SOCIAL HISTORY: Social History   Socioeconomic History   Marital status: Widowed    Spouse name: Not on file   Number of children: Not on file   Years of education: Not on file   Highest education level: Not on file  Occupational History   Not on file  Tobacco Use   Smoking status: Never   Smokeless tobacco: Never  Vaping Use   Vaping Use: Not on file  Substance and Sexual Activity   Alcohol use: No    Alcohol/week: 0.0 standard drinks of alcohol   Drug use: No   Sexual activity: Not on file  Other Topics Concern   Not on file  Social History Narrative   Not on file   Social Determinants of Health   Financial Resource Strain: Not on file  Food Insecurity: Not on file  Transportation Needs: Not on file  Physical Activity: Not on file  Stress: Not on file  Social Connections: Not on file      CURRENT MEDS:  Current Facility-Administered Medications (Endocrine & Metabolic):    [START ON 00/01/1101] levothyroxine (SYNTHROID) tablet 50 mcg  Current Outpatient  Medications (Endocrine & Metabolic):    levothyroxine (SYNTHROID, LEVOTHROID) 50 MCG tablet, Take 50 mcg by mouth daily before breakfast.  Current Facility-Administered Medications (Cardiovascular):    metoprolol tartrate (LOPRESSOR) tablet 100 mg  Current Outpatient Medications (Cardiovascular):    metoprolol tartrate (LOPRESSOR) 100 MG tablet, Take 1 tablet (100 mg total) by mouth 2 (two) times daily. Reduced from 200 mg.   Current Outpatient Medications (Respiratory):    benzonatate (TESSALON) 100 MG capsule, Take 100 mg by mouth 3 (three) times daily as needed for cough. (Patient not taking: Reported on 05/20/2022)  Current Facility-Administered Medications (Analgesics):    acetaminophen (TYLENOL)  tablet 650 mg **OR** acetaminophen (TYLENOL) suppository 650 mg   allopurinol (ZYLOPRIM) tablet 300 mg   HYDROcodone-acetaminophen (NORCO/VICODIN) 5-325 MG per tablet 1 tablet   morphine (PF) 2 MG/ML injection 2 mg  Current Outpatient Medications (Analgesics):    allopurinol (ZYLOPRIM) 300 MG tablet, Take 300 mg by mouth daily.    meloxicam (MOBIC) 15 MG tablet, Take 1 tablet (15 mg total) by mouth daily as needed for pain.   Current Outpatient Medications (Hematological):    vitamin B-12 (CYANOCOBALAMIN) 1000 MCG tablet, Take 1,000 mcg by mouth daily.   warfarin (COUMADIN) 1 MG tablet, Take 1 tablet by mouth daily.   warfarin (COUMADIN) 6 MG tablet, Take 1 tablet (6 mg total) by mouth daily at 4 PM. Reduced from 7 mg.  Current Facility-Administered Medications (Other):    carbidopa-levodopa (SINEMET IR) 25-100 MG per tablet immediate release 1.5 tablet   DULoxetine (CYMBALTA) DR capsule 60 mg   gabapentin (NEURONTIN) tablet 300 mg   lactated ringers infusion   pantoprazole (PROTONIX) EC tablet 40 mg   sodium chloride flush (NS) 0.9 % injection 3 mL  Current Outpatient Medications (Other):    Calcium Carb-Cholecalciferol 600-400 MG-UNIT TABS, Take 1 tablet by mouth 2 (two) times  daily with a meal.   carbidopa-levodopa (SINEMET IR) 25-100 MG tablet, Take 1.5 tablets by mouth 3 (three) times daily.   cephALEXin (KEFLEX) 500 MG capsule, Take 1 capsule (500 mg total) by mouth every 8 (eight) hours.   DULoxetine (CYMBALTA) 60 MG capsule, Take 1 capsule by mouth 2 (two) times daily.   gabapentin (NEURONTIN) 600 MG tablet, Take 0.5 tablets (300 mg total) by mouth 3 (three) times daily.   Homeopathic Products (PSORIASIS/ECZEMA RELIEF EX), Apply 1 application topically daily as needed (eczema).   hydrocortisone 2.5 % cream, Apply topically 2 (two) times daily as needed. Home med. (Patient not taking: Reported on 04/08/2022)   Magnesium 400 MG CAPS, Take 400 mg by mouth daily.   Multiple Vitamins tablet, Take 2 tablets by mouth daily.    omeprazole (PRILOSEC) 20 MG capsule, Take 20 mg by mouth daily.    oxybutynin (DITROPAN) 5 MG tablet, Take 5 mg by mouth 2 (two) times daily.   triamcinolone (KENALOG) 0.025 % cream, Apply 1 application topically 2 (two) times daily as needed. Home med.    ED Course: Pt in Ed oriented to herself afebrile meeting sepsis criteria with heart rate respiratory rate and white count and a source. Vitals:   06/19/22 1900 06/19/22 1930 06/19/22 1945 06/19/22 2000  BP: (!) 152/112  (!) 152/47 (!) 142/89  Pulse: 94 (!) 103 61 86  Resp: _0 (!) 24  Temp:      TempSrc:      SpO2: 95% 100% 95% 96%  Weight:      Height:       No intake/output data recorded. SpO2: 96 % Blood work in ed shows normal lactic acid, normal kidney function, normal LFTs normal electrolytes.  Results for orders placed or performed during the hospital encounter of 06/19/22 (from the past 24 hour(s))  Lactic acid, plasma     Status: None   Collection Time: 06/19/22  4:03 PM  Result Value Ref Range   Lactic Acid, Venous 1.7 0.5 - 1.9 mmol/L  Comprehensive metabolic panel     Status: Abnormal   Collection Time: 06/19/22  4:03 PM  Result Value Ref Range   Sodium 136 135  - 145 mmol/L   Potassium 3.6 3.5 -  5.1 mmol/L   Chloride 101 98 - 111 mmol/L   CO2 23 22 - 32 mmol/L   Glucose, Bld 92 70 - 99 mg/dL   BUN 16 8 - 23 mg/dL   Creatinine, Ser 0.62 0.44 - 1.00 mg/dL   Calcium 8.6 (L) 8.9 - 10.3 mg/dL   Total Protein 7.3 6.5 - 8.1 g/dL   Albumin 3.4 (L) 3.5 - 5.0 g/dL   AST 29 15 - 41 U/L   ALT 19 0 - 44 U/L   Alkaline Phosphatase 77 38 - 126 U/L   Total Bilirubin 1.2 0.3 - 1.2 mg/dL   GFR, Estimated >60 >60 mL/min   Anion gap 12 5 - 15  CBC with Differential     Status: Abnormal   Collection Time: 06/19/22  4:03 PM  Result Value Ref Range   WBC 13.1 (H) 4.0 - 10.5 K/uL   RBC 4.28 3.87 - 5.11 MIL/uL   Hemoglobin 13.0 12.0 - 15.0 g/dL   HCT 38.0 36.0 - 46.0 %   MCV 88.8 80.0 - 100.0 fL   MCH 30.4 26.0 - 34.0 pg   MCHC 34.2 30.0 - 36.0 g/dL   RDW 14.2 11.5 - 15.5 %   Platelets 225 150 - 400 K/uL   nRBC 0.0 0.0 - 0.2 %   Neutrophils Relative % 61 %   Neutro Abs 7.9 (H) 1.7 - 7.7 K/uL   Lymphocytes Relative 30 %   Lymphs Abs 3.9 0.7 - 4.0 K/uL   Monocytes Relative 9 %   Monocytes Absolute 1.2 (H) 0.1 - 1.0 K/uL   Eosinophils Relative 0 %   Eosinophils Absolute 0.0 0.0 - 0.5 K/uL   Basophils Relative 0 %   Basophils Absolute 0.1 0.0 - 0.1 K/uL   Immature Granulocytes 0 %   Abs Immature Granulocytes 0.04 0.00 - 0.07 K/uL  Protime-INR     Status: Abnormal   Collection Time: 06/19/22  4:03 PM  Result Value Ref Range   Prothrombin Time 24.1 (H) 11.4 - 15.2 seconds   INR 2.2 (H) 0.8 - 1.2  APTT     Status: Abnormal   Collection Time: 06/19/22  4:03 PM  Result Value Ref Range   aPTT 42 (H) 24 - 36 seconds  Procalcitonin - Baseline     Status: None   Collection Time: 06/19/22  4:03 PM  Result Value Ref Range   Procalcitonin <0.10 ng/mL  Brain natriuretic peptide     Status: Abnormal   Collection Time: 06/19/22  4:06 PM  Result Value Ref Range   B Natriuretic Peptide 164.7 (H) 0.0 - 100.0 pg/mL  Resp Panel by RT-PCR (Flu A&B, Covid) Anterior  Nasal Swab     Status: None   Collection Time: 06/19/22  5:44 PM   Specimen: Anterior Nasal Swab  Result Value Ref Range   SARS Coronavirus 2 by RT PCR NEGATIVE NEGATIVE   Influenza A by PCR NEGATIVE NEGATIVE   Influenza B by PCR NEGATIVE NEGATIVE  Urinalysis, Complete w Microscopic Urine, Clean Catch     Status: Abnormal   Collection Time: 06/19/22  5:44 PM  Result Value Ref Range   Color, Urine AMBER (A) YELLOW   APPearance HAZY (A) CLEAR   Specific Gravity, Urine 1.019 1.005 - 1.030   pH 5.0 5.0 - 8.0   Glucose, UA NEGATIVE NEGATIVE mg/dL   Hgb urine dipstick MODERATE (A) NEGATIVE   Bilirubin Urine NEGATIVE NEGATIVE   Ketones, ur 5 (A) NEGATIVE mg/dL   Protein,  ur 30 (A) NEGATIVE mg/dL   Nitrite NEGATIVE NEGATIVE   Leukocytes,Ua LARGE (A) NEGATIVE   RBC / HPF 11-20 0 - 5 RBC/hpf   WBC, UA >50 (H) 0 - 5 WBC/hpf   Bacteria, UA MANY (A) NONE SEEN   Squamous Epithelial / LPF 0-5 0 - 5   In Ed pt received as below Meds ordered this encounter  Medications   sodium chloride 0.9 % bolus 1,000 mL    Order Specific Question:   Reason 30 mL/kg dose is not being ordered    Answer:   Non-Septic Shock Clinical Presentation   ceFEPIme (MAXIPIME) 2 g in sodium chloride 0.9 % 100 mL IVPB    Order Specific Question:   Antibiotic Indication:    Answer:   UTI   metoprolol tartrate (LOPRESSOR) tablet 100 mg   allopurinol (ZYLOPRIM) tablet 300 mg   carbidopa-levodopa (SINEMET IR) 25-100 MG per tablet immediate release 1.5 tablet   DULoxetine (CYMBALTA) DR capsule 60 mg   gabapentin (NEURONTIN) tablet 300 mg   levothyroxine (SYNTHROID) tablet 50 mcg   metoprolol tartrate (LOPRESSOR) tablet 100 mg    Reduced from 200 mg.     pantoprazole (PROTONIX) EC tablet 40 mg   lactated ringers infusion   sodium chloride flush (NS) 0.9 % injection 3 mL   OR Linked Order Group    acetaminophen (TYLENOL) tablet 650 mg    acetaminophen (TYLENOL) suppository 650 mg   HYDROcodone-acetaminophen  (NORCO/VICODIN) 5-325 MG per tablet 1 tablet   morphine (PF) 2 MG/ML injection 2 mg    Unresulted Labs (From admission, onward)     Start     Ordered   06/20/22 0500  Comprehensive metabolic panel  Tomorrow morning,   STAT        06/19/22 2027   06/20/22 0500  CBC  Tomorrow morning,   STAT        06/19/22 2027   06/19/22 1605  Blood Culture (routine x 2)  (Septic presentation on arrival (screening labs, nursing and treatment orders for obvious sepsis))  BLOOD CULTURE X 2,   STAT      06/19/22 1607   06/19/22 1605  Urine Culture  (Septic presentation on arrival (screening labs, nursing and treatment orders for obvious sepsis))  ONCE - URGENT,   URGENT       Question:  Indication  Answer:  Sepsis   06/19/22 1607            Admission Imaging : CT Head Wo Contrast  Result Date: 06/19/2022 CLINICAL DATA:  Mental status change, unknown cause EXAM: CT HEAD WITHOUT CONTRAST TECHNIQUE: Contiguous axial images were obtained from the base of the skull through the vertex without intravenous contrast. RADIATION DOSE REDUCTION: This exam was performed according to the departmental dose-optimization program which includes automated exposure control, adjustment of the mA and/or kV according to patient size and/or use of iterative reconstruction technique. COMPARISON:  CT head 05/20/2022 FINDINGS: Brain: No evidence of large-territorial acute infarction. No parenchymal hemorrhage. No mass lesion. No extra-axial collection. No mass effect or midline shift. No hydrocephalus. Basilar cisterns are patent. Vascular: No hyperdense vessel. Skull: No acute fracture or focal lesion. Sinuses/Orbits: Paranasal sinuses and mastoid air cells are clear. Bilateral lens replacement. Otherwise the orbits are unremarkable. Other: None. IMPRESSION: No acute intracranial abnormality. Electronically Signed   By: Iven Finn M.D.   On: 06/19/2022 17:43   DG Chest Port 1 View  Result Date: 06/19/2022 CLINICAL DATA:   Questionable sepsis.  EXAM: PORTABLE CHEST 1 VIEW COMPARISON:  Chest radiograph 05/20/2022; CT chest 06/19/2021 FINDINGS: Borderline enlarged cardiac silhouette which may be exaggerated secondary to portable technique. Lungs are clear. No pleural effusion or pneumothorax. No acute osseous abnormality. Partially visualized right total shoulder arthroplasty. Partially visualized anterior cervical spinal fixation. Multilevel degenerative changes in the thoracic spine. IMPRESSION: No acute cardiopulmonary abnormality. Electronically Signed   By: Ileana Roup M.D.   On: 06/19/2022 16:42      Physical Examination: Vitals:   06/19/22 1900 06/19/22 1930 06/19/22 1945 06/19/22 2000  BP: (!) 152/112  (!) 152/47 (!) 142/89  Pulse: 94 (!) 103 61 86  Temp:      Resp: _0 (!) 24  Height:      Weight:      SpO2: 95% 100% 95% 96%  TempSrc:      BMI (Calculated):       Physical Exam Vitals and nursing note reviewed.  Constitutional:      General: She is not in acute distress.    Appearance: Normal appearance. She is not ill-appearing, toxic-appearing or diaphoretic.  HENT:     Head: Normocephalic and atraumatic.     Right Ear: Hearing and external ear normal.     Left Ear: Hearing and external ear normal.     Nose: Nose normal. No nasal deformity.     Mouth/Throat:     Lips: Pink.     Mouth: Mucous membranes are moist.     Tongue: No lesions.     Pharynx: Oropharynx is clear.  Eyes:     Extraocular Movements: Extraocular movements intact.     Pupils: Pupils are equal, round, and reactive to light.  Neck:     Vascular: No carotid bruit.  Cardiovascular:     Rate and Rhythm: Normal rate and regular rhythm.     Pulses: Normal pulses.     Heart sounds: Normal heart sounds.  Pulmonary:     Effort: Pulmonary effort is normal.     Breath sounds: Normal breath sounds.  Abdominal:     General: Bowel sounds are normal. There is no distension.     Palpations: Abdomen is soft. There is no mass.      Tenderness: There is no abdominal tenderness. There is no guarding.     Hernia: No hernia is present.  Musculoskeletal:     Right lower leg: No edema.     Left lower leg: No edema.  Skin:    General: Skin is warm.  Neurological:     General: No focal deficit present.     Mental Status: She is alert and oriented to person, place, and time.     Cranial Nerves: Cranial nerves 2-12 are intact.     Motor: Motor function is intact.  Psychiatric:        Attention and Perception: Attention normal.        Mood and Affect: Mood normal.        Speech: Speech normal.        Behavior: Behavior normal. Behavior is cooperative.        Cognition and Memory: Cognition normal.     Assessment and Plan: * AMS (altered mental status) Attribute patient's altered mental status/acute metabolic encephalopathy to her urinary tract infection. Goal will be to treat underlying cause of patient's confusion and monitor as needed with neurochecks. Aspiration precautions.   Sepsis secondary to UTI [facility]) We will continue patient on cefepime. We will follow culture and sensitivity.  Supportive treatment with maintenance IV fluid. Antipyretics and antiemetics as needed.   Parkinsonism Continue patient on her carbidopa levodopa combination along with gabapentin.   Hypothyroidism Continue patient on 50 mcg of levothyroxine.   CVA (cerebral vascular accident) [facility]) History of CVA. We will continue patient on warfarin.   Apnea, sleep CPAP per home settings.   Depression, major, recurrent, moderate ([place]) We will continue patient on her home regimen of Cymbalta.  Hypertension Vitals:   06/19/22 1549 06/19/22 1800 06/19/22 1900 06/19/22 1945  BP: (!) 148/96 (!) 206/105 (!) 152/112 (!) 152/47  Will continue patient on her home regimen with metoprolol. We will refrain from using NSAIDs which will further potentiate hypertension. Patient is on meloxicam at home. In the meantime we will also add as  needed hydralazine.  Gout Patient continued on her dose of allopurinol 300 mg daily.   Edema leg Patient has a history of chronic lymphedema, elevation and compression stockings.   AF (paroxysmal atrial fibrillation) (HCC) EKG today is irregular and patient is in sinus rhythm. We will continue patient on her Lopressor and warfarin.     DVT prophylaxis:  Coumadin  Code Status:  Full  Family Communication:  Leeann Must (Daughter)  [phone] (Mobile)   Disposition Plan:  Home  Consults called:  None  Admission status: Observation  Unit/ Expected LOS: Met telemetry   Para Skeans MD [facility]  6 PM- 2 AM. Please contact me via secure Chat 6 PM-2 AM. [phone] ( Pager ) To contact the [facility] Attending or Consulting provider [name] or covering provider during after hours [place], for this patient.   Check the care team in [facility] and look for a) attending/consulting [facility] provider listed and b) the [facility] team listed Log into [email or web address] and use [place]'s universal password to access. If you do not have the password, please contact the hospital operator. Locate the [facility] provider you are looking for under [facility] and page to a number that you can be directly reached. If you still have difficulty reaching the provider, please page the [facility] (Director on Call) for the Hospitalists listed on amion for assistance. [email or web address] 06/19/2022, 8:55 PM

## 2022-06-19 NOTE — Assessment & Plan Note (Signed)
Patient has a history of chronic lymphedema, elevation and compression stockings.

## 2022-06-19 NOTE — Assessment & Plan Note (Signed)
Patient continued on her dose of allopurinol 300 mg daily.

## 2022-06-19 NOTE — Assessment & Plan Note (Signed)
We will continue patient on her home regimen of Cymbalta.

## 2022-06-19 NOTE — Assessment & Plan Note (Signed)
We will continue patient on cefepime. We will follow culture and sensitivity. Supportive treatment with maintenance IV fluid. Antipyretics and antiemetics as needed.

## 2022-06-19 NOTE — Assessment & Plan Note (Signed)
Attribute patient's altered mental status/acute metabolic encephalopathy to her urinary tract infection. Goal will be to treat underlying cause of patient's confusion and monitor as needed with neurochecks. Aspiration precautions.

## 2022-06-19 NOTE — ED Notes (Signed)
XR at bedside at this time.

## 2022-06-19 NOTE — Assessment & Plan Note (Signed)
History of CVA. We will continue patient on warfarin.

## 2022-06-19 NOTE — Assessment & Plan Note (Signed)
Continue patient on her carbidopa levodopa combination along with gabapentin.

## 2022-06-19 NOTE — Assessment & Plan Note (Signed)
Continue patient on 50 mcg of levothyroxine.

## 2022-06-19 NOTE — Consult Note (Signed)
PHARMACY -  BRIEF ANTIBIOTIC NOTE   Pharmacy has received consult(s) for cefepime from an ED provider.  The patient's profile has been reviewed for ht/wt/allergies/indication/available labs.    One time order(s) placed for Cefepime 2g IV x1  Further antibiotics/pharmacy consults should be ordered by admitting physician if indicated.                       Thank you, Darrick Penna 06/19/2022  4:16 PM

## 2022-06-19 NOTE — Code Documentation (Signed)
CODE SEPSIS - PHARMACY COMMUNICATION  **[facility] Spectrum Antibiotics should be administered within 1 hour of Sepsis diagnosis**  Time Code Sepsis Called/Page Received: 1607  Antibiotics Ordered: cefepime  Time of 1st antibiotic administration: [place] ,PharmD Clinical Pharmacist  06/19/2022  4:16 PM

## 2022-06-19 NOTE — ED Provider Notes (Signed)
[facility] Provider Note    Event Date/Time   First MD Initiated Contact with Patient 06/19/22 1540     (approximate)   History   Chief Complaint: Altered Mental Status   HPI  Suzanne Dixon is a 71 y.o. female with a history of paroxysmal atrial fibrillation, gout, hypertension, stroke, parkinsonism, morbid obesity who comes to the ED by EMS due to confusion.  Patient has a home health nurse who noticed the patient to have a acute mental status change today.  Also noted to have malodorous urine.  Patient denies any focal pain.  She is oriented to self only.     Physical Exam   Triage Vital Signs: ED Triage Vitals  Enc Vitals Group     BP 06/19/22 1549 (!) 148/96     Pulse Rate 06/19/22 1549 (!) 104     Resp 06/19/22 1549 (!) 34     Temp 06/19/22 1549 98.4 F (36.9 C)     Temp Source 06/19/22 1549 Oral     SpO2 06/19/22 1549 98 %     Weight 06/19/22 1550 234 lb (106.1 kg)     Height 06/19/22 1550 '5\' 4"'$  (1.626 m)     Head Circumference --      Peak Flow --      Pain Score 06/19/22 1550 10     Pain Loc --      Pain Edu? --      Excl. in [place]? --     Most recent vital signs: Vitals:   06/19/22 2000 06/19/22 2100  BP: (!) 142/89 (!) 141/80  Pulse: 86 87  Resp: (!) 24 18  Temp:    SpO2: 96% 96%    General: Awake, no distress.  Oriented to self CV:  Good peripheral perfusion.  Tachycardia heart rate 110 Resp:  Normal effort.  Clear to auscultation bilaterally.  Tachypnea Abd:  No distention.  Soft with lower abdominal tenderness Other:  Symmetric calf circumference, no calf tenderness.  No evidence of head trauma.   ED Results / Procedures / Treatments   Labs (all labs ordered are listed, but only abnormal results are displayed) Labs Reviewed  COMPREHENSIVE METABOLIC PANEL - Abnormal; Notable for the following components:      Result Value   Calcium 8.6 (*)    Albumin 3.4 (*)    All other components within normal limits  CBC WITH  DIFFERENTIAL/PLATELET - Abnormal; Notable for the following components:   WBC 13.1 (*)    Neutro Abs 7.9 (*)    Monocytes Absolute 1.2 (*)    All other components within normal limits  PROTIME-INR - Abnormal; Notable for the following components:   Prothrombin Time 24.1 (*)    INR 2.2 (*)    All other components within normal limits  APTT - Abnormal; Notable for the following components:   aPTT 42 (*)    All other components within normal limits  URINALYSIS, COMPLETE (UACMP) WITH MICROSCOPIC - Abnormal; Notable for the following components:   Color, Urine AMBER (*)    APPearance HAZY (*)    Hgb urine dipstick MODERATE (*)    Ketones, ur 5 (*)    Protein, ur 30 (*)    Leukocytes,Ua LARGE (*)    WBC, UA >50 (*)    Bacteria, UA MANY (*)    All other components within normal limits  BRAIN NATRIURETIC PEPTIDE - Abnormal; Notable for the following components:   B Natriuretic Peptide 164.7 (*)  All other components within normal limits  RESP PANEL BY RT-PCR (FLU A&B, COVID) ARPGX2  CULTURE, BLOOD (ROUTINE X 2)  CULTURE, BLOOD (ROUTINE X 2)  URINE CULTURE  LACTIC ACID, PLASMA  PROCALCITONIN  COMPREHENSIVE METABOLIC PANEL  CBC     EKG Interpreted by me Sinus tachycardia rate 103.  Left axis, normal intervals.  Normal QRS ST segments and T waves.  Repeat EKG shows sinus rhythm rate of 98, no acute ischemic changes or interval change.   RADIOLOGY Chest x-ray interpreted by me, appears normal without edema effusion or pneumonia.  Radiology report reviewed.  CT head unremarkable   PROCEDURES:  .Critical Care  Performed by: Carrie Mew, MD Authorized by: Carrie Mew, MD   Critical care provider statement:    Critical care time (minutes):  35   Critical care time was exclusive of:  Separately billable procedures and treating other patients   Critical care was necessary to treat or prevent imminent or life-threatening deterioration of the following conditions:   Sepsis and CNS failure or compromise   Critical care was time spent personally by me on the following activities:  Development of treatment plan with patient or surrogate, discussions with consultants, evaluation of patient's response to treatment, examination of patient, obtaining history from patient or surrogate, ordering and performing treatments and interventions, ordering and review of laboratory studies, ordering and review of radiographic studies, pulse oximetry, re-evaluation of patient's condition and review of old charts   Care discussed with: admitting provider   Comments:        .1-3 Lead EKG Interpretation  Performed by: Carrie Mew, MD Authorized by: Carrie Mew, MD     Interpretation: abnormal     ECG rate:  110   ECG rate assessment: tachycardic     Rhythm: sinus tachycardia     Ectopy: none     Conduction: normal      MEDICATIONS ORDERED IN ED: Medications  allopurinol (ZYLOPRIM) tablet 300 mg (has no administration in time range)  carbidopa-levodopa (SINEMET IR) 25-100 MG per tablet immediate release 1.5 tablet (has no administration in time range)  DULoxetine (CYMBALTA) DR capsule 60 mg (has no administration in time range)  gabapentin (NEURONTIN) tablet 300 mg (has no administration in time range)  levothyroxine (SYNTHROID) tablet 50 mcg (has no administration in time range)  metoprolol tartrate (LOPRESSOR) tablet 100 mg (has no administration in time range)  pantoprazole (PROTONIX) EC tablet 40 mg (has no administration in time range)  lactated ringers infusion (has no administration in time range)  sodium chloride flush (NS) 0.9 % injection 3 mL (has no administration in time range)  acetaminophen (TYLENOL) tablet 650 mg (has no administration in time range)    Or  acetaminophen (TYLENOL) suppository 650 mg (has no administration in time range)  HYDROcodone-acetaminophen (NORCO/VICODIN) 5-325 MG per tablet 1 tablet (has no administration in time  range)  morphine (PF) 2 MG/ML injection 2 mg (has no administration in time range)  sodium chloride 0.9 % bolus 1,000 mL (0 mLs Intravenous Stopped 06/19/22 1919)  ceFEPIme (MAXIPIME) 2 g in sodium chloride 0.9 % 100 mL IVPB (0 g Intravenous Stopped 06/19/22 1742)  metoprolol tartrate (LOPRESSOR) tablet 100 mg (100 mg Oral Given 06/19/22 1920)     IMPRESSION / MDM / [place] / ED COURSE  I reviewed the triage vital signs and the nursing notes.  Differential diagnosis includes, but is not limited to, pneumonia, UTI, sepsis, dehydration, AKI, anemia, electrode abnormality, viral illness, intracranial hemorrhage  Patient's presentation is most consistent with acute presentation with potential threat to life or bodily function.  Patient presents with acute altered mental status, tachycardia tachypnea concerning for infection and sepsis primarily.  Labs show a leukocytosis of 13,000.  Chemistry panel is unremarkable, COVID and flu negative.  Urinalysis consistent with UTI.  Prior urine culture was positive for Klebsiella, resistant to Macrobid, Bactrim, amoxicillin.  Patient was discharged with Keflex after her last hospitalization a month ago.  Due to recent antibiotic use and recurrent UTI, will need to treat with broad-spectrum antibiotics with cefepime.  Lactate is normal, blood pressure stable.  Heart rate improved with IV fluids and her home dose of oral metoprolol.  No signs of shock.    Patient does live alone and with her acute decompensation and encephalopathy precluding her from being able to care for herself, she will need to be hospitalized.  Case discussed with hospitalist for further management.       FINAL CLINICAL IMPRESSION(S) / ED DIAGNOSES   Final diagnoses:  Cystitis  Metabolic encephalopathy  Morbid obesity ([place])     Rx / DC Orders   ED Discharge Orders     None        Note:  This document was prepared using Dragon  voice recognition software and may include unintentional dictation errors.   Carrie Mew, MD 06/19/22 2129

## 2022-06-19 NOTE — Assessment & Plan Note (Signed)
CPAP per home settings.   

## 2022-06-20 DIAGNOSIS — I10 Essential (primary) hypertension: Secondary | ICD-10-CM

## 2022-06-20 DIAGNOSIS — L8990 Pressure ulcer of unspecified site, unspecified stage: Secondary | ICD-10-CM | POA: Insufficient documentation

## 2022-06-20 DIAGNOSIS — I480 Paroxysmal atrial fibrillation: Secondary | ICD-10-CM

## 2022-06-20 DIAGNOSIS — G4730 Sleep apnea, unspecified: Secondary | ICD-10-CM | POA: Diagnosis not present

## 2022-06-20 DIAGNOSIS — R410 Disorientation, unspecified: Secondary | ICD-10-CM | POA: Diagnosis not present

## 2022-06-20 DIAGNOSIS — I639 Cerebral infarction, unspecified: Secondary | ICD-10-CM | POA: Diagnosis not present

## 2022-06-20 DIAGNOSIS — E039 Hypothyroidism, unspecified: Secondary | ICD-10-CM

## 2022-06-20 DIAGNOSIS — F331 Major depressive disorder, recurrent, moderate: Secondary | ICD-10-CM

## 2022-06-20 DIAGNOSIS — R600 Localized edema: Secondary | ICD-10-CM

## 2022-06-20 LAB — COMPREHENSIVE METABOLIC PANEL
ALT: <5 U/L (ref 0–44)
AST: 21 U/L (ref 15–41)
Albumin: 3.1 g/dL — ABNORMAL LOW (ref 3.5–5.0)
Alkaline Phosphatase: 72 U/L (ref 38–126)
Anion gap: 7 (ref 5–15)
BUN: 13 mg/dL (ref 8–23)
CO2: 25 mmol/L (ref 22–32)
Calcium: 8.2 mg/dL — ABNORMAL LOW (ref 8.9–10.3)
Chloride: 105 mmol/L (ref 98–111)
Creatinine, Ser: 0.53 mg/dL (ref 0.44–1.00)
GFR, Estimated: >60 mL/min (ref >60)
Glucose, Bld: 83 mg/dL (ref 70–99)
Potassium: 3.5 mmol/L (ref 3.5–5.1)
Sodium: 137 mmol/L (ref 135–145)
Total Bilirubin: 1 mg/dL (ref 0.3–1.2)
Total Protein: 6.5 g/dL (ref 6.5–8.1)

## 2022-06-20 LAB — CBC
HCT: 35.6 % — ABNORMAL LOW (ref 36.0–46.0)
Hemoglobin: 12 g/dL (ref 12.0–15.0)
MCH: 30.3 pg (ref 26.0–34.0)
MCHC: 33.7 g/dL (ref 30.0–36.0)
MCV: 89.9 fL (ref 80.0–100.0)
Platelets: 189 10*3/uL (ref 150–400)
RBC: 3.96 MIL/uL (ref 3.87–5.11)
RDW: 14.2 % (ref 11.5–15.5)
WBC: 9.7 10*3/uL (ref 4.0–10.5)
nRBC: 0 % (ref 0.0–0.2)

## 2022-06-20 LAB — URINE CULTURE: Culture: NO GROWTH

## 2022-06-20 LAB — PROTIME-INR
INR: 1.9 — ABNORMAL HIGH (ref 0.8–1.2)
Prothrombin Time: 21.2 seconds — ABNORMAL HIGH (ref 11.4–15.2)

## 2022-06-20 MED ORDER — SODIUM CHLORIDE 0.9 % IV SOLN
2.0000 g | Freq: Three times a day (TID) | INTRAVENOUS | Status: DC
  Administered 2022-06-20 – 2022-06-21 (×4): 2 g via INTRAVENOUS
  Filled 2022-06-20: qty 2
  Filled 2022-06-20 (×2): qty 12.5
  Filled 2022-06-20 (×2): qty 2

## 2022-06-20 MED ORDER — WARFARIN SODIUM 6 MG PO TABS
9.0000 mg | ORAL_TABLET | Freq: Once | ORAL | Status: AC
  Administered 2022-06-20: 9 mg via ORAL
  Filled 2022-06-20: qty 1

## 2022-06-20 NOTE — Progress Notes (Signed)
Pharmacy Antibiotic Note  Suzanne Dixon is a 71 y.o. female admitted on 06/19/2022 with UTI.  Pharmacy has been consulted for Cefepime dosing.  Plan: Cefepime 2 gm IV X 1 given in ED on 10/1 @ 1631. Cefepime 2 gm IV Q8H to continue on 10/2 @ 0630.   Height: '5\' 4"'$  (162.6 cm) Weight: 99.8 kg (220 lb) IBW/kg (Calculated) : 54.7  Temp (24hrs), Avg:98.7 F (37.1 C), Min:98.4 F (36.9 C), Max:99.5 F (37.5 C)  Recent Labs  Lab 06/19/22 1603 06/20/22 0359  WBC 13.1* 9.7  CREATININE 0.62 0.53  LATICACIDVEN 1.7  --     Estimated Creatinine Clearance: 71.9 mL/min (by C-G formula based on SCr of 0.53 mg/dL).    Allergies  Allergen Reactions   Advair Diskus [Fluticasone-Salmeterol] Other (See Comments)    Joint pain    Alendronate     Muscle pain   Singulair [Montelukast] Other (See Comments)    "muscle pain"   Sulfa Antibiotics Hives   Venlafaxine Other (See Comments)    "altered mental status/tremors"    Antimicrobials this admission:   >>    >>   Dose adjustments this admission:   Microbiology results:  BCx:   UCx:    Sputum:    MRSA PCR:   Thank you for allowing pharmacy to be a part of this patient's care.  Kamiryn Bezanson D 06/20/2022 6:30 AM

## 2022-06-20 NOTE — Consult Note (Signed)
ANTICOAGULATION CONSULT NOTE - Initial Consult  Pharmacy Consult for warfarin Indication: atrial fibrillation  Allergies  Allergen Reactions   Advair Diskus [Fluticasone-Salmeterol] Other (See Comments)    Joint pain    Alendronate     Muscle pain   Singulair [Montelukast] Other (See Comments)    "muscle pain"   Sulfa Antibiotics Hives   Venlafaxine Other (See Comments)    "altered mental status/tremors"    Patient Measurements: Height: '5\' 4"'$  (162.6 cm) Weight: 99.8 kg (220 lb) IBW/kg (Calculated) : 54.7   Vital Signs: Temp: 98.4 F (36.9 C) (10/02 0532) Temp Source: Oral (10/02 0532) BP: 146/87 (10/02 0532) Pulse Rate: 75 (10/02 0532)  Labs: Recent Labs    06/19/22 1603 06/20/22 0359  HGB 13.0 12.0  HCT 38.0 35.6*  PLT 225 189  APTT 42*  --   LABPROT 24.1* 21.2*  INR 2.2* 1.9*  CREATININE 0.62 0.53     Estimated Creatinine Clearance: 71.9 mL/min (by C-G formula based on SCr of 0.53 mg/dL).   Medical History: Past Medical History:  Diagnosis Date   A-fib ([place])    Anemia    B12 deficiency    Breast cancer ([place]) 03/31/2013   Right - chemo- mastectomy   Breast cancer ([place]) 1991   Rt.- radiation   Complication of anesthesia 01/2009   Recent years with general anesthesia had itching following surgery   DDD (degenerative disc disease), cervical    Depression    Diverticulitis    Dyspnea    Edema of both legs    GERD (gastroesophageal reflux disease)    Gout    H/O mastectomy 2014   per patient   H/O: cesarean section 1987   per patient report   H/O: hysterectomy 1996   per patient   Hydradenitis    Hypertension    Hypothyroidism    Neuropathy    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 female with a history of paroxysmal atrial fibrillation, gout, hypertension, stroke, parkinsonism, morbid obesity presents with confusion. Pharmacy consulted for management of warfarin given history of atrial fibrillation. INR slightly subtherapeutic today.  Drug interactions: Levothyroxine + warfarin : may increase warfarin's effects Allopurinol + warfarin : may increase warfarin's effects  Goal of Therapy:  INR 2-3 Monitor platelets by anticoagulation protocol: Yes  Date INR Dose  10/1 2.2 6 mg  10/2 1.9 9 mg    Plan: 10/2: INR just slightly subtherapeutic, will continue home regimen Give Warfarin '9mg'$  PO x 1 in accordance with home regimen CBC and INR daily with AM labs  Dara Hoyer, PharmD PGY-1 Pharmacy Resident 06/20/2022 8:28 AM

## 2022-06-20 NOTE — Progress Notes (Signed)
PROGRESS NOTE    Suzanne Dixon  JAS:[id number] DOB: 08-Apr-1951 DOA: 06/19/2022 PCP: Adin Hector, MD    Brief Narrative:  Suzanne Dixon is an 71 y.o. female coming to [place] today for altered mental status noted by home health nurse. Patient had a similar admission about a month ago for confusion and found to have urinary tract infection then as well  10/2 UA +. MS improved today  Consultants:    Procedures:   Antimicrobials:  cefepime    Subjective: Feels better. No sob, cp, dizziness, gu sx  Objective: Vitals:   06/19/22 2100 06/19/22 2306 06/20/22 0532 06/20/22 0756  BP: (!) 141/80 (!) 146/63 (!) 146/87 127/82  Pulse: 87 85 75 70  Resp: '18 16 20 14  '$ Temp: 98.8 F (37.1 C) 98.5 F (36.9 C) 98.4 F (36.9 C) 97.8 F (36.6 C)  TempSrc: Oral Oral Oral Oral  SpO2: 96% 100% 98% 99%  Weight:  99.8 kg    Height:  '5\' 4"'$  (1.626 m)      Intake/Output Summary (Last 24 hours) at 06/20/2022 1416 Last data filed at 06/20/2022 1011 Gross per 24 hour  Intake 861.38 ml  Output 800 ml  Net 61.38 ml   Filed Weights   06/19/22 1550 06/19/22 2306  Weight: 106.1 kg 99.8 kg    Examination: Calm, NAD Cta no w/r Reg s1/s2 no gallop Soft benign +bs No edema Aaoxox3  Mood and affect appropriate in current setting     Data Reviewed: I have personally reviewed following labs and imaging studies  CBC: Recent Labs  Lab 06/19/22 1603 06/20/22 0359  WBC 13.1* 9.7  NEUTROABS 7.9*  --   HGB 13.0 12.0  HCT 38.0 35.6*  MCV 88.8 89.9  PLT 225 419   Basic Metabolic Panel: Recent Labs  Lab 06/19/22 1603 06/20/22 0359  NA 136 137  K 3.6 3.5  CL 101 105  CO2 23 25  GLUCOSE 92 83  BUN 16 13  CREATININE 0.62 0.53  CALCIUM 8.6* 8.2*   GFR: Estimated Creatinine Clearance: 71.9 mL/min (by C-G formula based on SCr of 0.53 mg/dL). Liver Function Tests: Recent Labs  Lab 06/19/22 1603 06/20/22 0359  AST 29 21  ALT 19 <5  ALKPHOS 77 72  BILITOT 1.2 1.0  PROT 7.3  6.5  ALBUMIN 3.4* 3.1*   No results for input(s): "LIPASE", "AMYLASE" in the last 168 hours. No results for input(s): "AMMONIA" in the last 168 hours. Coagulation Profile: Recent Labs  Lab 06/19/22 1603 06/20/22 0359  INR 2.2* 1.9*   Cardiac Enzymes: No results for input(s): "CKTOTAL", "CKMB", "CKMBINDEX", "TROPONINI" in the last 168 hours. BNP (last 3 results) No results for input(s): "PROBNP" in the last 8760 hours. HbA1C: No results for input(s): "HGBA1C" in the last 72 hours. CBG: No results for input(s): "GLUCAP" in the last 168 hours. Lipid Profile: No results for input(s): "CHOL", "HDL", "LDLCALC", "TRIG", "CHOLHDL", "LDLDIRECT" in the last 72 hours. Thyroid Function Tests: No results for input(s): "TSH", "T4TOTAL", "FREET4", "T3FREE", "THYROIDAB" in the last 72 hours. Anemia Panel: No results for input(s): "VITAMINB12", "FOLATE", "FERRITIN", "TIBC", "IRON", "RETICCTPCT" in the last 72 hours. Sepsis Labs: Recent Labs  Lab 06/19/22 1603  PROCALCITON <0.10  LATICACIDVEN 1.7    Recent Results (from the past 240 hour(s))  Resp Panel by RT-PCR (Flu A&B, Covid) Anterior Nasal Swab     Status: None   Collection Time: 06/19/22  5:44 PM   Specimen: Anterior Nasal Swab  Result Value Ref Range Status   SARS Coronavirus 2 by RT PCR NEGATIVE NEGATIVE Final    Comment: (NOTE) SARS-CoV-2 target nucleic acids are NOT DETECTED.  The SARS-CoV-2 RNA is generally detectable in upper respiratory specimens during the acute phase of infection. The lowest concentration of SARS-CoV-2 viral copies this assay can detect is 138 copies/mL. A negative result does not preclude SARS-Cov-2 infection and should not be used as the sole basis for treatment or other patient management decisions. A negative result may occur with  improper specimen collection/handling, submission of specimen other than nasopharyngeal swab, presence of viral mutation(s) within the areas targeted by this assay, and  inadequate number of viral copies(<138 copies/mL). A negative result must be combined with clinical observations, patient history, and epidemiological information. The expected result is Negative.  Fact Sheet for Patients:  EntrepreneurPulse.com.au  Fact Sheet for Healthcare Providers:  IncredibleEmployment.be  This test is no t yet approved or cleared by the [place] FDA and  has been authorized for detection and/or diagnosis of SARS-CoV-2 by FDA under an Emergency Use Authorization (EUA). This EUA will remain  in effect (meaning this test can be used) for the duration of the COVID-19 declaration under Section 564(b)(1) of the Act, 21 U.S.C.section 360bbb-3(b)(1), unless the authorization is terminated  or revoked sooner.       Influenza A by PCR NEGATIVE NEGATIVE Final   Influenza B by PCR NEGATIVE NEGATIVE Final    Comment: (NOTE) The Xpert Xpress SARS-CoV-2/FLU/RSV plus assay is intended as an aid in the diagnosis of influenza from Nasopharyngeal swab specimens and should not be used as a sole basis for treatment. Nasal washings and aspirates are unacceptable for Xpert Xpress SARS-CoV-2/FLU/RSV testing.  Fact Sheet for Patients: EntrepreneurPulse.com.au  Fact Sheet for Healthcare Providers: IncredibleEmployment.be  This test is not yet approved or cleared by the [place] FDA and has been authorized for detection and/or diagnosis of SARS-CoV-2 by FDA under an Emergency Use Authorization (EUA). This EUA will remain in effect (meaning this test can be used) for the duration of the COVID-19 declaration under Section 564(b)(1) of the Act, 21 U.S.C. section 360bbb-3(b)(1), unless the authorization is terminated or revoked.  Performed at [facility], [address], [place], [place] [address]          Radiology Studies: CT Head Wo Contrast  Result Date: 06/19/2022 CLINICAL  DATA:  Mental status change, unknown cause EXAM: CT HEAD WITHOUT CONTRAST TECHNIQUE: Contiguous axial images were obtained from the base of the skull through the vertex without intravenous contrast. RADIATION DOSE REDUCTION: This exam was performed according to the departmental dose-optimization program which includes automated exposure control, adjustment of the mA and/or kV according to patient size and/or use of iterative reconstruction technique. COMPARISON:  CT head 05/20/2022 FINDINGS: Brain: No evidence of large-territorial acute infarction. No parenchymal hemorrhage. No mass lesion. No extra-axial collection. No mass effect or midline shift. No hydrocephalus. Basilar cisterns are patent. Vascular: No hyperdense vessel. Skull: No acute fracture or focal lesion. Sinuses/Orbits: Paranasal sinuses and mastoid air cells are clear. Bilateral lens replacement. Otherwise the orbits are unremarkable. Other: None. IMPRESSION: No acute intracranial abnormality. Electronically Signed   By: Iven Finn M.D.   On: 06/19/2022 17:43   DG Chest Port 1 View  Result Date: 06/19/2022 CLINICAL DATA:  Questionable sepsis. EXAM: PORTABLE CHEST 1 VIEW COMPARISON:  Chest radiograph 05/20/2022; CT chest 06/19/2021 FINDINGS: Borderline enlarged cardiac silhouette which may be exaggerated secondary to portable technique. Lungs are  clear. No pleural effusion or pneumothorax. No acute osseous abnormality. Partially visualized right total shoulder arthroplasty. Partially visualized anterior cervical spinal fixation. Multilevel degenerative changes in the thoracic spine. IMPRESSION: No acute cardiopulmonary abnormality. Electronically Signed   By: Ileana Roup M.D.   On: 06/19/2022 16:42        Scheduled Meds:  allopurinol  300 mg Oral Daily   carbidopa-levodopa  1.5 tablet Oral TID   DULoxetine  60 mg Oral BID   gabapentin  300 mg Oral TID   influenza vaccine adjuvanted  0.5 mL Intramuscular Tomorrow-1000    levothyroxine  50 mcg Oral Q0600   metoprolol tartrate  100 mg Oral BID   pantoprazole  40 mg Oral Daily   sodium chloride flush  3 mL Intravenous Q12H   warfarin  9 mg Oral ONCE-1600   Warfarin - Pharmacist Dosing Inpatient   Does not apply q1600   Continuous Infusions:  ceFEPime (MAXIPIME) IV 2 g (06/20/22 0641)   lactated ringers Stopped (06/20/22 7408)    Assessment & Plan:   Principal Problem:   AMS (altered mental status) Active Problems:   Sepsis secondary to UTI (HCC)   AF (paroxysmal atrial fibrillation) (HCC)   Edema leg   Gout   Hypertension   Depression, major, recurrent, moderate (HCC)   Apnea, sleep   CVA (cerebral vascular accident) ([place])   Hypothyroidism   Parkinsonism   Pressure injury of skin   AMS (altered mental status) Attribute patient's altered mental status/acute metabolic encephalopathy to her urinary tract infection. 10/2 MS at baseline has improved Continue treatment with IV antibiotics     Sepsis secondary to UTI ([place]) Hemodynamic stable Urine culture pending Continue IV antibiotics   Parkinsonism Continue Sinemet     Hypothyroidism Continue levothyroxine     CVA (cerebral vascular accident) ([place]) History of CVA. Continue Coumadin     Apnea, sleep CPAP per home settings.     Depression, major, recurrent, moderate (HCC) Continue Cymbalta   Hypertension Continue beta-blockers   Gout Patient continued on her dose of allopurinol 300 mg daily.     Edema leg Patient has a history of chronic lymphedema, elevation and compression stockings.     AF (paroxysmal atrial fibrillation) (HCC) EKG today is irregular and patient is in sinus rhythm. We will continue patient on her Lopressor and warfarin  DVT prophylaxis: Coumadin Code Status: Full Family Communication: None at bedside Disposition Plan:  Status is: Observation The patient remains OBS appropriate and will d/c before 2 midnights.        LOS: 0 days    Time spent: 35 minutes    Nolberto Hanlon, MD [facility] Pager [phone]  If 7PM-7AM, please contact night-coverage 06/20/2022, 2:16 PM

## 2022-06-21 DIAGNOSIS — G4730 Sleep apnea, unspecified: Secondary | ICD-10-CM | POA: Diagnosis not present

## 2022-06-21 DIAGNOSIS — R410 Disorientation, unspecified: Secondary | ICD-10-CM | POA: Diagnosis not present

## 2022-06-21 DIAGNOSIS — I480 Paroxysmal atrial fibrillation: Secondary | ICD-10-CM | POA: Diagnosis not present

## 2022-06-21 DIAGNOSIS — I639 Cerebral infarction, unspecified: Secondary | ICD-10-CM | POA: Diagnosis not present

## 2022-06-21 LAB — CREATININE, SERUM
Creatinine, Ser: 0.5 mg/dL (ref 0.44–1.00)
GFR, Estimated: >60 mL/min (ref >60)

## 2022-06-21 LAB — PROTIME-INR
INR: 2.1 — ABNORMAL HIGH (ref 0.8–1.2)
Prothrombin Time: 23.2 seconds — ABNORMAL HIGH (ref 11.4–15.2)

## 2022-06-21 MED ORDER — GABAPENTIN 600 MG PO TABS: 300.0000 mg | ORAL_TABLET | Freq: Three times a day (TID) | ORAL | Status: DC

## 2022-06-21 MED ORDER — CIPROFLOXACIN HCL 500 MG PO TABS: 500.0000 mg | ORAL_TABLET | Freq: Two times a day (BID) | ORAL | 0 refills | Status: AC

## 2022-06-21 MED ORDER — SODIUM CHLORIDE 0.9 % IV SOLN
2.0000 g | Freq: Once | INTRAVENOUS | Status: AC
  Administered 2022-06-21: 2 g via INTRAVENOUS
  Filled 2022-06-21: qty 20

## 2022-06-21 MED ORDER — SODIUM CHLORIDE 0.9 % IV SOLN
2.0000 g | Freq: Two times a day (BID) | INTRAVENOUS | Status: DC
  Filled 2022-06-21: qty 12.5

## 2022-06-21 MED ORDER — WARFARIN SODIUM 6 MG PO TABS
6.0000 mg | ORAL_TABLET | Freq: Once | ORAL | Status: DC
  Filled 2022-06-21: qty 1

## 2022-06-21 NOTE — Consult Note (Signed)
ANTICOAGULATION CONSULT NOTE - Initial Consult  Pharmacy Consult for warfarin Indication: atrial fibrillation  Allergies  Allergen Reactions   Advair Diskus [Fluticasone-Salmeterol] Other (See Comments)    Joint pain    Alendronate     Muscle pain   Singulair [Montelukast] Other (See Comments)    "muscle pain"   Sulfa Antibiotics Hives   Venlafaxine Other (See Comments)    "altered mental status/tremors"    Patient Measurements: Height: '5\' 4"'$  (162.6 cm) Weight: 100.8 kg (222 lb 3.6 oz) IBW/kg (Calculated) : 54.7   Vital Signs: Temp: 97.7 F (36.5 C) (10/03 0228) Temp Source: Oral (10/03 0228) BP: 137/84 (10/03 0228) Pulse Rate: 60 (10/03 0228)  Labs: Recent Labs    06/19/22 1603 06/20/22 0359 06/21/22 0515  HGB 13.0 12.0  --   HCT 38.0 35.6*  --   PLT 225 189  --   APTT 42*  --   --   LABPROT 24.1* 21.2* 23.2*  INR 2.2* 1.9* 2.1*  CREATININE 0.62 0.53  --      Estimated Creatinine Clearance: 72.3 mL/min (by C-G formula based on SCr of 0.53 mg/dL).   Medical History: Past Medical History:  Diagnosis Date   A-fib ([place])    Anemia    B12 deficiency    Breast cancer ([place]) 03/31/2013   Right - chemo- mastectomy   Breast cancer ([place]) 1991   Rt.- radiation   Complication of anesthesia 01/2009   Recent years with general anesthesia had itching following surgery   DDD (degenerative disc disease), cervical    Depression    Diverticulitis    Dyspnea    Edema of both legs    GERD (gastroesophageal reflux disease)    Gout    H/O mastectomy 2014   per patient   H/O: cesarean section 1987   per patient report   H/O: hysterectomy 1996   per patient   Hydradenitis    Hypertension    Hypothyroidism    Neuropathy    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 female with a history of paroxysmal atrial fibrillation, gout, hypertension, stroke, parkinsonism, morbid obesity presents with confusion. Pharmacy consulted for management of warfarin given history of atrial fibrillation. INR therapeutic today.  Drug interactions: Levothyroxine + warfarin : may increase warfarin's effects Allopurinol + warfarin : may increase warfarin's effects  Goal of Therapy:  INR 2-3 Monitor platelets by anticoagulation protocol: Yes  Date INR Dose  10/1 2.2 6 mg  10/2 1.9 9 mg  10/3 2.1 6 mg    Plan: 10/3: INR subtherapeutic, will continue home regimen Give warfarin 6 mg PO x 1 in accordance with home regimen Check INR daily with AM labs Monitor CBC weekly while on warfarin  Dara Hoyer, PharmD PGY-1 Pharmacy Resident 06/21/2022 6:45 AM

## 2022-06-21 NOTE — Progress Notes (Signed)
Suzanne Dixon to be D/C'd Home per MD order.  Discussed prescriptions and follow up appointments with the patient. Prescriptions given to patient, medication list explained in detail. Pt verbalized understanding.  Allergies as of 06/21/2022       Reactions   Advair Diskus [fluticasone-salmeterol] Other (See Comments)   Joint pain    Alendronate    Muscle pain   Singulair [montelukast] Other (See Comments)   "muscle pain"   Sulfa Antibiotics Hives   Venlafaxine Other (See Comments)   "altered mental status/tremors"        Medication List     STOP taking these medications    benzonatate 100 MG capsule Commonly known as: TESSALON   cephALEXin 500 MG capsule Commonly known as: KEFLEX   hydrocortisone 2.5 % cream   meloxicam 15 MG tablet Commonly known as: MOBIC       TAKE these medications    allopurinol 300 MG tablet Commonly known as: ZYLOPRIM Take 300 mg by mouth daily.   Calcium Carb-Cholecalciferol 600-400 MG-UNIT Tabs Take 1 tablet by mouth 2 (two) times daily with a meal.   carbidopa-levodopa 25-100 MG tablet Commonly known as: SINEMET IR Take 1.5 tablets by mouth 3 (three) times daily.   ciprofloxacin 500 MG tablet Commonly known as: Cipro Take 1 tablet (500 mg total) by mouth 2 (two) times daily for 2 doses. Start taking on: June 22, 2022   cyanocobalamin 1000 MCG tablet Commonly known as: VITAMIN B12 Take 1,000 mcg by mouth daily.   DULoxetine 60 MG capsule Commonly known as: CYMBALTA Take 60 mg by mouth 2 (two) times daily.   gabapentin 600 MG tablet Commonly known as: NEURONTIN Take 0.5 tablets (300 mg total) by mouth 3 (three) times daily. What changed: how much to take   levothyroxine 50 MCG tablet Commonly known as: SYNTHROID Take 50 mcg by mouth daily before breakfast.   Magnesium 400 MG Caps Take 400 mg by mouth daily.   metoprolol tartrate 100 MG tablet Commonly known as: LOPRESSOR Take 1 tablet (100 mg total) by mouth 2  (two) times daily. Reduced from 200 mg.   Multiple Vitamins tablet Take 2 tablets by mouth daily.   omeprazole 20 MG capsule Commonly known as: PRILOSEC Take 20 mg by mouth daily.   oxybutynin 5 MG tablet Commonly known as: DITROPAN Take 5 mg by mouth 2 (two) times daily.   PSORIASIS/ECZEMA RELIEF EX Apply 1 application topically daily as needed (eczema).   triamcinolone 0.025 % cream Commonly known as: KENALOG Apply 1 application topically 2 (two) times daily as needed. Home med.   warfarin 6 MG tablet Commonly known as: COUMADIN Take 1 tablet (6 mg total) by mouth daily at 4 PM. Reduced from 7 mg. What changed: additional instructions   warfarin 1 MG tablet Commonly known as: COUMADIN Take 3 mg by mouth daily. Take 9 mg by mouth daily on Monday and Friday. Take 6 mg all other days What changed: Another medication with the same name was changed. Make sure you understand how and when to take each.               Discharge Care Instructions  (From admission, onward)           Start     Ordered   06/21/22 0000  Discharge wound care:       Comments: As above   06/21/22 1020   06/21/22 0000  Discharge wound care:       Comments:  As abvove   06/21/22 1027            Vitals:   06/21/22 0822 06/21/22 0947  BP: (!) 142/78   Pulse: (!) 56 73  Resp: 18   Temp: 97.9 F (36.6 C)   SpO2: 95%     Skin clean, dry and intact without evidence of skin break down, no evidence of skin tears noted. IV catheter discontinued intact. Site without signs and symptoms of complications. Dressing and pressure applied. Pt denies pain at this time. No complaints noted.  An After Visit Summary was printed and given to the patient. Patient escorted via [name], and D/C home via private auto.  [place] C. [name]

## 2022-06-21 NOTE — TOC Initial Note (Addendum)
Transition of Care [facility]) - Initial/Assessment Note    Patient Details  Name: Suzanne Dixon MRN: [id number] Date of Birth: Aug 31, 1951  [facility] CM/SW Contact:    Beverly Sessions, RN Phone Number: 06/21/2022, 11:47 AM  Clinical Narrative:                  Admitted for: UTI Admitted from: home alone. Has aide 4 hours 3 days a week PCP: Ama: Current home health/prior home health/DME: bed, wc, rw, hospital bed, cane  Patient to discharge today.  Patient was recently closed with [facility] and would like to use them again.  Referral made and accepted by [place] with [facility]    Patient states that her aide will transport her at discharge      Patient Goals and CMS Choice        Expected Discharge Plan and Services           Expected Discharge Date: 06/21/22                                    Prior Living Arrangements/Services                       Activities of Daily Living Home Assistive Devices/Equipment: Eyeglasses, Wheelchair, Environmental consultant (specify type) ADL Screening (condition at time of admission) Patient's cognitive ability adequate to safely complete daily activities?: Yes Is the patient deaf or have difficulty hearing?: No Does the patient have difficulty seeing, even when wearing glasses/contacts?: No Does the patient have difficulty concentrating, remembering, or making decisions?: Yes Patient able to express need for assistance with ADLs?: Yes Does the patient have difficulty dressing or bathing?: Yes Independently performs ADLs?: No Communication: Independent Dressing (OT): Needs assistance Is this a change from baseline?: Pre-admission baseline Grooming: Needs assistance Is this a change from baseline?: Pre-admission baseline Feeding: Independent Bathing: Needs assistance Is this a change from baseline?: Pre-admission baseline Toileting: Dependent Is this a change from baseline?: Pre-admission  baseline In/Out Bed: Dependent Is this a change from baseline?: Pre-admission baseline Walks in Home: Dependent Is this a change from baseline?: Pre-admission baseline Does the patient have difficulty walking or climbing stairs?: Yes Weakness of Legs: Both Weakness of Arms/Hands: None  Permission Sought/Granted                  Emotional Assessment              Admission diagnosis:  Morbid obesity ([place]) [E08.14] Metabolic encephalopathy [G81.85] Paroxysmal atrial fibrillation ([place]) [I48.0] Cystitis [N30.90] AMS (altered mental status) [R41.82] Patient Active Problem List   Diagnosis Date Noted   Pressure injury of skin 06/20/2022   Sepsis secondary to UTI ([place]) 06/19/2022   AMS (altered mental status) 06/19/2022   Parkinsonism 63/14/9702   Acute metabolic encephalopathy 63/78/5885   Thoracic degenerative disc disease 08/24/2021   Aortic atherosclerosis ([place]) 08/23/2021   COVID-19 virus infection 06/19/2021   CVA (cerebral vascular accident) ([place]) 02/01/2021   Hypothyroidism    Left leg cellulitis    Acquired thrombophilia ([place]) 12/10/2020   Status post reverse total shoulder replacement, 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   Lymphedema 07/17/2020   Right rotator cuff tear arthropathy 04/07/2020   Edema of both legs 12/31/2019   Polyneuropathy associated with underlying disease ([place]) 12/31/2019   Swelling of limb 12/31/2019   Hematuria, microscopic 06/07/2018   Age-related osteoporosis without current pathological fracture  12/08/2017   Rotator cuff arthropathy, right 01/03/2017   Biceps tendinitis of right upper extremity 12/05/2016   Complete tear of right rotator cuff 12/05/2016   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   History of breast cancer in female 06/02/2016   Allergic state 04/01/2015   Arthritis 04/01/2015   Cancer ([place]) 04/01/2015   Edema leg 04/01/2015   Gout 04/01/2015   Hidradenitis  04/01/2015   Elevated lymphocyte count 04/01/2015   Disorder of peripheral nervous system 04/01/2015   Apnea, sleep 04/01/2015   Avitaminosis D 04/01/2015   Breast cancer ([place]) 04/01/2015   Hypertension 04/15/2014   B12 deficiency 02/24/2014   AF (paroxysmal atrial fibrillation) ([place]) 02/17/2014   Adiposity 06/04/2012   Depression, major, recurrent, moderate ([place]) 03/26/2012   PCP:  Adin Hector, MD Pharmacy:   [id number]: [phone]: [phone]    Social Determinants of Health (SDOH) Interventions    Readmission Risk Interventions    06/21/2021    4:12 PM  Readmission Risk Prevention Plan  Transportation Screening Complete  PCP or Specialist Appt within 3-5 Days Complete  HRI or [place]  Social Work Consult for [place]sel[place] Complete  Palliative Care Screening Not Applicable  Medication Review (Press photographer Complete

## 2022-06-21 NOTE — Care Management Obs Status (Signed)
[place] NOTIFICATION   Patient Details  Name: Suzanne Dixon MRN: [id number] Date of Birth: 1951-05-22   Medicare Observation Status Notification Given:  Yes    Beverly Sessions, RN 06/21/2022, 10:40 AM

## 2022-06-21 NOTE — Progress Notes (Signed)
Pharmacy Antibiotic Note  Suzanne Dixon is a 71 y.o. female admitted on 06/19/2022 with UTI.  Pharmacy has been consulted for Cefepime dosing.  Assessment: 71 yo F with PMH paroxysmal atrial fibrillation, gout, hypertension, stroke, parkinsonism, morbid obesity presenting with AMS and noted to have similar presentation a month ago due to UTI. Pt's home health nurse reports urine is foul-smelling. UA WBCs >50 and systemic WBCs 13.1 on admission, now down to 9.7. Pt is afebrile and VSS. Ucx and finalized with no growth. Bcx collected and NGTD @ 2 days.  Plan: Adjust cefepime to 2 g IV q12H (pt no longer septic, WBC returned to WNL within a day; suspicion is only for UTI at this point and Ucx finalized as negative) Monitor for worsening signs & symptoms of infection to assess for antibiotic efficacy Monitor renal function to assess for necessary changes in cefepime dosing frequency  Height: '5\' 4"'$  (162.6 cm) Weight: 100.8 kg (222 lb 3.6 oz) IBW/kg (Calculated) : 54.7  Temp (24hrs), Avg:97.9 F (36.6 C), Min:97.6 F (36.4 C), Max:98.3 F (36.8 C)  Recent Labs  Lab 06/19/22 1603 06/20/22 0359 06/21/22 0742  WBC 13.1* 9.7  --   CREATININE 0.62 0.53 0.50  LATICACIDVEN 1.7  --   --      Estimated Creatinine Clearance: 72.3 mL/min (by C-G formula based on SCr of 0.5 mg/dL).    Allergies  Allergen Reactions   Advair Diskus [Fluticasone-Salmeterol] Other (See Comments)    Joint pain    Alendronate     Muscle pain   Singulair [Montelukast] Other (See Comments)    "muscle pain"   Sulfa Antibiotics Hives   Venlafaxine Other (See Comments)    "altered mental status/tremors"    Antimicrobials this admission: Cefepime 10/1  >>   Dose adjustments this admission: Cefepime 2 g IV q8H >> 2 g IV q12H   Microbiology results: 10/1 BCx: NGTD @ 2 days 10/1 UCx:  NG (final)  RVP negative  Thank you for allowing pharmacy to be a part of this patient's care.  Dara Hoyer,  PharmD PGY-1 Pharmacy Resident 06/21/2022 9:28 AM

## 2022-06-21 NOTE — Discharge Summary (Signed)
Suzanne Dixon OMB:[id number] DOB: 1950-12-19 DOA: 06/19/2022  PCP: Adin Hector, MD  Admit date: 06/19/2022 Discharge date: 06/21/2022  Admitted From: Home Disposition: Home  Recommendations for Outpatient Follow-up:  Follow up with PCP in 1 week Please obtain BMP/CBC in one week   Home Health: Yes   Discharge Condition:Stable CODE STATUS: Full Diet recommendation: Heart Healthy  Brief/Interim Summary: Per GTX:MIWOE Suzanne Dixon is an 71 y.o. female coming to [place] today for altered mental status noted by home health nurse. Patient had a similar admission about a month ago for confusion and found to have urinary tract infection then as well.  UA was positive.  Urine cultures so far negative.  Mental status improved and at baseline.  She is stable to be discharged.  She was treated with IV antibiotics.   AMS (altered mental status) Attribute patient's altered mental status/acute metabolic encephalopathy to her urinary tract infection. Improved and at  baseline      Sepsis ruled out UTI [facility]) Hemodynamic stable Urine culture tdn Was treated with iv abx. Dc on po abx to complete course   Parkinsonism Continue Sinemet     Hypothyroidism Continue levothyroxine     CVA (cerebral vascular accident) ([place]) History of CVA. Continue Coumadin Pt was informed to monitor inr closely since on antibiotics.      Apnea, sleep CPAP per home settings.     Depression, major, recurrent, moderate ([place]) Continue Cymbalta   Hypertension Continue home meds     Gout No acute attack continue allopurinol      Edema leg Patient has a history of chronic lymphedema, elevation and compression stockings.     AF (paroxysmal atrial fibrillation) (HCC)  in sinus rhythm. Continue beta blk and coumadin   Discharge Diagnoses:  Principal Problem:   AMS (altered mental status) Active Problems:   Sepsis secondary to UTI (HCC)   AF (paroxysmal atrial fibrillation) (HCC)   Edema  leg   Gout   Hypertension   Depression, major, recurrent, moderate (HCC)   Apnea, sleep   CVA (cerebral vascular accident) ([place])   Hypothyroidism   Parkinsonism   Pressure injury of skin    Discharge Instructions  Discharge Instructions     Diet - low sodium heart healthy   Complete by: As directed    Diet - low sodium heart healthy   Complete by: As directed    Discharge instructions   Complete by: As directed    Since on antibiotics , need to check inr closely next few days. F/u with pcp. Get inr check tomorrow and next day   Discharge wound care:   Complete by: As directed    As above   Discharge wound care:   Complete by: As directed    As abvove   Increase activity slowly   Complete by: As directed    Increase activity slowly   Complete by: As directed       Allergies as of 06/21/2022       Reactions   Advair Diskus [fluticasone-salmeterol] Other (See Comments)   Joint pain    Alendronate    Muscle pain   Singulair [montelukast] Other (See Comments)   "muscle pain"   Sulfa Antibiotics Hives   Venlafaxine Other (See Comments)   "altered mental status/tremors"        Medication List     STOP taking these medications    benzonatate 100 MG capsule Commonly known as: TESSALON   cephALEXin 500 MG capsule Commonly  known as: KEFLEX   hydrocortisone 2.5 % cream   meloxicam 15 MG tablet Commonly known as: MOBIC       TAKE these medications    allopurinol 300 MG tablet Commonly known as: ZYLOPRIM Take 300 mg by mouth daily.   Calcium Carb-Cholecalciferol 600-400 MG-UNIT Tabs Take 1 tablet by mouth 2 (two) times daily with a meal.   carbidopa-levodopa 25-100 MG tablet Commonly known as: SINEMET IR Take 1.5 tablets by mouth 3 (three) times daily.   ciprofloxacin 500 MG tablet Commonly known as: Cipro Take 1 tablet (500 mg total) by mouth 2 (two) times daily for 2 doses. Start taking on: June 22, 2022   cyanocobalamin 1000 MCG  tablet Commonly known as: VITAMIN B12 Take 1,000 mcg by mouth daily.   DULoxetine 60 MG capsule Commonly known as: CYMBALTA Take 60 mg by mouth 2 (two) times daily.   gabapentin 600 MG tablet Commonly known as: NEURONTIN Take 0.5 tablets (300 mg total) by mouth 3 (three) times daily. What changed: how much to take   levothyroxine 50 MCG tablet Commonly known as: SYNTHROID Take 50 mcg by mouth daily before breakfast.   Magnesium 400 MG Caps Take 400 mg by mouth daily.   metoprolol tartrate 100 MG tablet Commonly known as: LOPRESSOR Take 1 tablet (100 mg total) by mouth 2 (two) times daily. Reduced from 200 mg.   Multiple Vitamins tablet Take 2 tablets by mouth daily.   omeprazole 20 MG capsule Commonly known as: PRILOSEC Take 20 mg by mouth daily.   oxybutynin 5 MG tablet Commonly known as: DITROPAN Take 5 mg by mouth 2 (two) times daily.   PSORIASIS/ECZEMA RELIEF EX Apply 1 application topically daily as needed (eczema).   triamcinolone 0.025 % cream Commonly known as: KENALOG Apply 1 application topically 2 (two) times daily as needed. Home med.   warfarin 6 MG tablet Commonly known as: COUMADIN Take 1 tablet (6 mg total) by mouth daily at 4 PM. Reduced from 7 mg. What changed: additional instructions   warfarin 1 MG tablet Commonly known as: COUMADIN Take 3 mg by mouth daily. Take 9 mg by mouth daily on Monday and Friday. Take 6 mg all other days What changed: Another medication with the same name was changed. Make sure you understand how and when to take each.               Discharge Care Instructions  (From admission, onward)           Start     Ordered   06/21/22 0000  Discharge wound care:       Comments: As above   06/21/22 1020   06/21/22 0000  Discharge wound care:       Comments: As abvove   06/21/22 1027            Follow-up Information     Tama High III, MD Follow up in 1 week(s).   Specialty: Internal  Medicine Contact information: [place] [address] [phone]                Allergies  Allergen Reactions   Advair Diskus [Fluticasone-Salmeterol] Other (See Comments)    Joint pain    Alendronate     Muscle pain   Singulair [Montelukast] Other (See Comments)    "muscle pain"   Sulfa Antibiotics Hives   Venlafaxine Other (See Comments)    "altered mental status/tremors"    Consultations:  Procedures/Studies: CT Head Wo Contrast  Result Date: 06/19/2022 CLINICAL DATA:  Mental status change, unknown cause EXAM: CT HEAD WITHOUT CONTRAST TECHNIQUE: Contiguous axial images were obtained from the base of the skull through the vertex without intravenous contrast. RADIATION DOSE REDUCTION: This exam was performed according to the departmental dose-optimization program which includes automated exposure control, adjustment of the mA and/or kV according to patient size and/or use of iterative reconstruction technique. COMPARISON:  CT head 05/20/2022 FINDINGS: Brain: No evidence of large-territorial acute infarction. No parenchymal hemorrhage. No mass lesion. No extra-axial collection. No mass effect or midline shift. No hydrocephalus. Basilar cisterns are patent. Vascular: No hyperdense vessel. Skull: No acute fracture or focal lesion. Sinuses/Orbits: Paranasal sinuses and mastoid air cells are clear. Bilateral lens replacement. Otherwise the orbits are unremarkable. Other: None. IMPRESSION: No acute intracranial abnormality. Electronically Signed   By: Iven Finn M.D.   On: 06/19/2022 17:43   DG Chest Port 1 View  Result Date: 06/19/2022 CLINICAL DATA:  Questionable sepsis. EXAM: PORTABLE CHEST 1 VIEW COMPARISON:  Chest radiograph 05/20/2022; CT chest 06/19/2021 FINDINGS: Borderline enlarged cardiac silhouette which may be exaggerated secondary to portable technique. Lungs are clear. No pleural effusion or pneumothorax. No acute  osseous abnormality. Partially visualized right total shoulder arthroplasty. Partially visualized anterior cervical spinal fixation. Multilevel degenerative changes in the thoracic spine. IMPRESSION: No acute cardiopulmonary abnormality. Electronically Signed   By: Ileana Roup M.D.   On: 06/19/2022 16:42      Subjective:   Discharge Exam: Vitals:   06/21/22 0822 06/21/22 0947  BP: (!) 142/78   Pulse: (!) 56 73  Resp: 18   Temp: 97.9 F (36.6 C)   SpO2: 95%    Vitals:   06/21/22 0228 06/21/22 0500 06/21/22 0822 06/21/22 0947  BP: 137/84  (!) 142/78   Pulse: 60  (!) 56 73  Resp: 20  18   Temp: 97.7 F (36.5 C)  97.9 F (36.6 C)   TempSrc: Oral  Oral   SpO2: 100%  95%   Weight:  100.8 kg    Height:        General: Pt is alert, awake, not in acute distress Cardiovascular: RRR, S1/S2 +, no rubs, no gallops Respiratory: CTA bilaterally, no wheezing, no rhonchi Abdominal: Soft, NT, ND, bowel sounds + Extremities: no edema, no cyanosis    The results of significant diagnostics from this hospitalization (including imaging, microbiology, ancillary and laboratory) are listed below for reference.     Microbiology: Recent Results (from the past 240 hour(s))  Blood Culture (routine x 2)     Status: None (Preliminary result)   Collection Time: 06/19/22  4:05 PM   Specimen: Right Antecubital; Blood  Result Value Ref Range Status   Specimen Description RIGHT ANTECUBITAL  Final   Special Requests   Final    BOTTLES DRAWN AEROBIC AND ANAEROBIC Blood Culture adequate volume   Culture   Final    NO GROWTH 2 DAYS Performed at [facility], [address] [place],  [address]    Report Status PENDING  Incomplete  Blood Culture (routine x 2)     Status: None (Preliminary result)   Collection Time: 06/19/22  4:10 PM   Specimen: Left Antecubital; Blood  Result Value Ref Range Status   Specimen Description LEFT ANTECUBITAL  Final   Special Requests   Final     BOTTLES DRAWN AEROBIC AND ANAEROBIC Blood Culture adequate volume   Culture   Final  NO GROWTH 2 DAYS Performed at [facility], [address] [place], [place] [address]    Report Status PENDING  Incomplete  Resp Panel by RT-PCR (Flu A&Suzanne, Covid) Anterior Nasal Swab     Status: None   Collection Time: 06/19/22  5:44 PM   Specimen: Anterior Nasal Swab  Result Value Ref Range Status   SARS Coronavirus 2 by RT PCR NEGATIVE NEGATIVE Final    Comment: (NOTE) SARS-CoV-2 target nucleic acids are NOT DETECTED.  The SARS-CoV-2 RNA is generally detectable in upper respiratory specimens during the acute phase of infection. The lowest concentration of SARS-CoV-2 viral copies this assay can detect is 138 copies/mL. A negative result does not preclude SARS-Cov-2 infection and should not be used as the sole basis for treatment or other patient management decisions. A negative result may occur with  improper specimen collection/handling, submission of specimen other than nasopharyngeal swab, presence of viral mutation(s) within the areas targeted by this assay, and inadequate number of viral copies(<138 copies/mL). A negative result must be combined with clinical observations, patient history, and epidemiological information. The expected result is Negative.  Fact Sheet for Patients:  EntrepreneurPulse.com.au  Fact Sheet for Healthcare Providers:  IncredibleEmployment.be  This test is no t yet approved or cleared by the [place] FDA and  has been authorized for detection and/or diagnosis of SARS-CoV-2 by FDA under an Emergency Use Authorization (EUA). This EUA will remain  in effect (meaning this test can be used) for the duration of the COVID-19 declaration under Section 564(Suzanne)(1) of the Act, 21 U.S.C.section 360bbb-3(Suzanne)(1), unless the authorization is terminated  or revoked sooner.       Influenza A by PCR NEGATIVE NEGATIVE Final    Influenza Suzanne by PCR NEGATIVE NEGATIVE Final    Comment: (NOTE) The Xpert Xpress SARS-CoV-2/FLU/RSV plus assay is intended as an aid in the diagnosis of influenza from Nasopharyngeal swab specimens and should not be used as a sole basis for treatment. Nasal washings and aspirates are unacceptable for Xpert Xpress SARS-CoV-2/FLU/RSV testing.  Fact Sheet for Patients: EntrepreneurPulse.com.au  Fact Sheet for Healthcare Providers: IncredibleEmployment.be  This test is not yet approved or cleared by the [place] FDA and has been authorized for detection and/or diagnosis of SARS-CoV-2 by FDA under an Emergency Use Authorization (EUA). This EUA will remain in effect (meaning this test can be used) for the duration of the COVID-19 declaration under Section 564(Suzanne)(1) of the Act, 21 U.S.C. section 360bbb-3(Suzanne)(1), unless the authorization is terminated or revoked.  Performed at [facility], [address], [place], [place] [address]   Urine Culture     Status: None   Collection Time: 06/19/22  5:44 PM   Specimen: In/Out Cath Urine  Result Value Ref Range Status   Specimen Description   Final    IN/OUT CATH URINE Performed at [facility], [address], [place], [place] [address]    Special Requests   Final    NONE Performed at [facility], [address], [place], [place] [address]    Culture   Final    NO GROWTH Performed at [facility], [place] [address] [place], [place] [address]    Report Status 06/20/2022 FINAL  Final     Labs: BNP (last 3 results) Recent Labs    05/20/22 1351 06/19/22 1606  BNP 304.6* 476.5*   Basic Metabolic Panel: Recent Labs  Lab 06/19/22 1603 06/20/22 0359 06/21/22 0742  NA 136 137  --  K 3.6 3.5  --   CL 101 105  --   CO2 23 25  --   GLUCOSE 92 83  --   BUN 16 13  --   CREATININE 0.62 0.53 0.50  CALCIUM 8.6* 8.2*  --    Liver Function Tests: Recent Labs   Lab 06/19/22 1603 06/20/22 0359  AST 29 21  ALT 19 <5  ALKPHOS 77 72  BILITOT 1.2 1.0  PROT 7.3 6.5  ALBUMIN 3.4* 3.1*   No results for input(s): "LIPASE", "AMYLASE" in the last 168 hours. No results for input(s): "AMMONIA" in the last 168 hours. CBC: Recent Labs  Lab 06/19/22 1603 06/20/22 0359  WBC 13.1* 9.7  NEUTROABS 7.9*  --   HGB 13.0 12.0  HCT 38.0 35.6*  MCV 88.8 89.9  PLT 225 189   Cardiac Enzymes: No results for input(s): "CKTOTAL", "CKMB", "CKMBINDEX", "TROPONINI" in the last 168 hours. BNP: Invalid input(s): "POCBNP" CBG: No results for input(s): "GLUCAP" in the last 168 hours. D-Dimer No results for input(s): "DDIMER" in the last 72 hours. Hgb A1c No results for input(s): "HGBA1C" in the last 72 hours. Lipid Profile No results for input(s): "CHOL", "HDL", "LDLCALC", "TRIG", "CHOLHDL", "LDLDIRECT" in the last 72 hours. Thyroid function studies No results for input(s): "TSH", "T4TOTAL", "T3FREE", "THYROIDAB" in the last 72 hours.  Invalid input(s): "FREET3" Anemia work up No results for input(s): "VITAMINB12", "FOLATE", "FERRITIN", "TIBC", "IRON", "RETICCTPCT" in the last 72 hours. Urinalysis    Component Value Date/Time   COLORURINE AMBER (A) 06/19/2022 1744   APPEARANCEUR HAZY (A) 06/19/2022 1744   APPEARANCEUR Hazy 05/27/2013 1107   LABSPEC 1.019 06/19/2022 1744   LABSPEC 1.011 05/27/2013 1107   PHURINE 5.0 06/19/2022 1744   GLUCOSEU NEGATIVE 06/19/2022 1744   GLUCOSEU Negative 05/27/2013 1107   HGBUR MODERATE (A) 06/19/2022 1744   BILIRUBINUR NEGATIVE 06/19/2022 1744   BILIRUBINUR Negative 05/27/2013 1107   KETONESUR 5 (A) 06/19/2022 1744   PROTEINUR 30 (A) 06/19/2022 1744   NITRITE NEGATIVE 06/19/2022 1744   LEUKOCYTESUR LARGE (A) 06/19/2022 1744   LEUKOCYTESUR 1+ 05/27/2013 1107   Sepsis Labs Recent Labs  Lab 06/19/22 1603 06/20/22 0359  WBC 13.1* 9.7   Microbiology Recent Results (from the past 240 hour(s))  Blood Culture  (routine x 2)     Status: None (Preliminary result)   Collection Time: 06/19/22  4:05 PM   Specimen: Right Antecubital; Blood  Result Value Ref Range Status   Specimen Description RIGHT ANTECUBITAL  Final   Special Requests   Final    BOTTLES DRAWN AEROBIC AND ANAEROBIC Blood Culture adequate volume   Culture   Final    NO GROWTH 2 DAYS Performed at [facility], [address] [place], [place] [address]    Report Status PENDING  Incomplete  Blood Culture (routine x 2)     Status: None (Preliminary result)   Collection Time: 06/19/22  4:10 PM   Specimen: Left Antecubital; Blood  Result Value Ref Range Status   Specimen Description LEFT ANTECUBITAL  Final   Special Requests   Final    BOTTLES DRAWN AEROBIC AND ANAEROBIC Blood Culture adequate volume   Culture   Final    NO GROWTH 2 DAYS Performed at [facility], [address] [place], [place] [address]    Report Status PENDING  Incomplete  Resp Panel by RT-PCR (Flu A&Suzanne, Covid) Anterior Nasal Swab     Status: None   Collection Time: 06/19/22  5:44  PM   Specimen: Anterior Nasal Swab  Result Value Ref Range Status   SARS Coronavirus 2 by RT PCR NEGATIVE NEGATIVE Final    Comment: (NOTE) SARS-CoV-2 target nucleic acids are NOT DETECTED.  The SARS-CoV-2 RNA is generally detectable in upper respiratory specimens during the acute phase of infection. The lowest concentration of SARS-CoV-2 viral copies this assay can detect is 138 copies/mL. A negative result does not preclude SARS-Cov-2 infection and should not be used as the sole basis for treatment or other patient management decisions. A negative result may occur with  improper specimen collection/handling, submission of specimen other than nasopharyngeal swab, presence of viral mutation(s) within the areas targeted by this assay, and inadequate number of viral copies(<138 copies/mL). A negative result must be combined with clinical observations, patient  history, and epidemiological information. The expected result is Negative.  Fact Sheet for Patients:  EntrepreneurPulse.com.au  Fact Sheet for Healthcare Providers:  IncredibleEmployment.be  This test is no t yet approved or cleared by the [place] FDA and  has been authorized for detection and/or diagnosis of SARS-CoV-2 by FDA under an Emergency Use Authorization (EUA). This EUA will remain  in effect (meaning this test can be used) for the duration of the COVID-19 declaration under Section 564(Suzanne)(1) of the Act, 21 U.S.C.section 360bbb-3(Suzanne)(1), unless the authorization is terminated  or revoked sooner.       Influenza A by PCR NEGATIVE NEGATIVE Final   Influenza Suzanne by PCR NEGATIVE NEGATIVE Final    Comment: (NOTE) The Xpert Xpress SARS-CoV-2/FLU/RSV plus assay is intended as an aid in the diagnosis of influenza from Nasopharyngeal swab specimens and should not be used as a sole basis for treatment. Nasal washings and aspirates are unacceptable for Xpert Xpress SARS-CoV-2/FLU/RSV testing.  Fact Sheet for Patients: EntrepreneurPulse.com.au  Fact Sheet for Healthcare Providers: IncredibleEmployment.be  This test is not yet approved or cleared by the [place] FDA and has been authorized for detection and/or diagnosis of SARS-CoV-2 by FDA under an Emergency Use Authorization (EUA). This EUA will remain in effect (meaning this test can be used) for the duration of the COVID-19 declaration under Section 564(Suzanne)(1) of the Act, 21 U.S.C. section 360bbb-3(Suzanne)(1), unless the authorization is terminated or revoked.  Performed at [facility], [address] [place], [place] [address]   Urine Culture     Status: None   Collection Time: 06/19/22  5:44 PM   Specimen: In/Out Cath Urine  Result Value Ref Range Status   Specimen Description   Final    IN/OUT CATH URINE Performed at [facility], [address], [place], [place] [address]    Special Requests   Final    NONE Performed at [facility], [address] [place], [place] [address]    Culture   Final    NO GROWTH Performed at [facility], [place] [address], [place], [place] [address]    Report Status 06/20/2022 FINAL  Final     Time coordinating discharge: Over 30 minutes  SIGNED:   Nolberto Hanlon, MD  [facility] 06/21/2022, 1:22 PM Pager   If 7PM-7AM, please contact night-coverage [email or web address] Password [email or web address]

## 2022-06-22 ENCOUNTER — Telehealth (INDEPENDENT_AMBULATORY_CARE_PROVIDER_SITE_OTHER): Payer: Self-pay | Admitting: Vascular Surgery

## 2022-06-22 NOTE — Telephone Encounter (Signed)
If her knee is swollen it may not be just her lymphedema, but some arthritis as well.  I would advise that the patient begin using conservative therapy if she has not been.  She should elevate legs when not active, wear her compression socks daily and walk as possible.  If she has a lymphedema pump she should be using that daily.  If she is having issues with her knee she may want to reach to her PCP for evaluation.

## 2022-06-22 NOTE — Telephone Encounter (Signed)
Patient was made aware with medical recommendations and verbalized understanding 

## 2022-06-22 NOTE — Telephone Encounter (Signed)
Patient called and LVM stating her Lymphedema has flared back up and her leg and knee is swollen.  Please advise.

## 2022-06-24 LAB — CULTURE, BLOOD (ROUTINE X 2)
Culture: NO GROWTH
Culture: NO GROWTH
Special Requests: ADEQUATE
Special Requests: ADEQUATE

## 2022-07-04 ENCOUNTER — Other Ambulatory Visit: Payer: Self-pay | Admitting: Internal Medicine

## 2022-07-04 DIAGNOSIS — Z1231 Encounter for screening mammogram for malignant neoplasm of breast: Secondary | ICD-10-CM

## 2022-07-18 ENCOUNTER — Encounter (INDEPENDENT_AMBULATORY_CARE_PROVIDER_SITE_OTHER): Payer: Self-pay

## 2022-08-03 ENCOUNTER — Ambulatory Visit
Admission: RE | Admit: 2022-08-03 | Discharge: 2022-08-03 | Disposition: A | Discharge status: Home / Self Care | Payer: Medicare HMO | Source: Ambulatory Visit | Attending: Internal Medicine | Admitting: Internal Medicine

## 2022-08-03 DIAGNOSIS — Z1231 Encounter for screening mammogram for malignant neoplasm of breast: Secondary | ICD-10-CM | POA: Diagnosis not present

## 2022-09-20 ENCOUNTER — Other Ambulatory Visit: Payer: Self-pay | Admitting: Gastroenterology

## 2022-09-20 DIAGNOSIS — R1310 Dysphagia, unspecified: Secondary | ICD-10-CM

## 2022-09-26 ENCOUNTER — Ambulatory Visit
Admission: RE | Admit: 2022-09-26 | Discharge: 2022-09-26 | Disposition: A | Discharge status: Home / Self Care | Payer: Medicare HMO | Source: Ambulatory Visit | Attending: Gastroenterology | Admitting: Gastroenterology

## 2022-09-26 DIAGNOSIS — R1310 Dysphagia, unspecified: Secondary | ICD-10-CM

## 2023-01-12 ENCOUNTER — Emergency Department: Payer: Medicare HMO

## 2023-01-12 ENCOUNTER — Inpatient Hospital Stay
Admission: EM | Admit: 2023-01-12 | Discharge: 2023-01-16 | DRG: 871 | Disposition: A | Discharge status: Home Health | Payer: Medicare HMO | Attending: Internal Medicine | Admitting: Internal Medicine

## 2023-01-12 ENCOUNTER — Other Ambulatory Visit: Payer: Self-pay

## 2023-01-12 DIAGNOSIS — Z7901 Long term (current) use of anticoagulants: Secondary | ICD-10-CM | POA: Diagnosis not present

## 2023-01-12 DIAGNOSIS — N3000 Acute cystitis without hematuria: Secondary | ICD-10-CM | POA: Diagnosis present

## 2023-01-12 DIAGNOSIS — I10 Essential (primary) hypertension: Secondary | ICD-10-CM | POA: Diagnosis present

## 2023-01-12 DIAGNOSIS — K219 Gastro-esophageal reflux disease without esophagitis: Secondary | ICD-10-CM | POA: Diagnosis present

## 2023-01-12 DIAGNOSIS — Z8673 Personal history of transient ischemic attack (TIA), and cerebral infarction without residual deficits: Secondary | ICD-10-CM

## 2023-01-12 DIAGNOSIS — R400 Somnolence: Secondary | ICD-10-CM | POA: Diagnosis present

## 2023-01-12 DIAGNOSIS — Z9884 Bariatric surgery status: Secondary | ICD-10-CM

## 2023-01-12 DIAGNOSIS — Z923 Personal history of irradiation: Secondary | ICD-10-CM | POA: Diagnosis not present

## 2023-01-12 DIAGNOSIS — G9341 Metabolic encephalopathy: Secondary | ICD-10-CM | POA: Diagnosis present

## 2023-01-12 DIAGNOSIS — Z888 Allergy status to other drugs, medicaments and biological substances status: Secondary | ICD-10-CM

## 2023-01-12 DIAGNOSIS — R6520 Severe sepsis without septic shock: Secondary | ICD-10-CM | POA: Diagnosis present

## 2023-01-12 DIAGNOSIS — M109 Gout, unspecified: Secondary | ICD-10-CM | POA: Diagnosis present

## 2023-01-12 DIAGNOSIS — Z9011 Acquired absence of right breast and nipple: Secondary | ICD-10-CM

## 2023-01-12 DIAGNOSIS — Z79890 Hormone replacement therapy (postmenopausal): Secondary | ICD-10-CM | POA: Diagnosis not present

## 2023-01-12 DIAGNOSIS — E669 Obesity, unspecified: Secondary | ICD-10-CM | POA: Diagnosis present

## 2023-01-12 DIAGNOSIS — F32A Depression, unspecified: Secondary | ICD-10-CM | POA: Diagnosis present

## 2023-01-12 DIAGNOSIS — A4151 Sepsis due to Escherichia coli [E. coli]: Principal | ICD-10-CM | POA: Diagnosis present

## 2023-01-12 DIAGNOSIS — A419 Sepsis, unspecified organism: Secondary | ICD-10-CM | POA: Diagnosis not present

## 2023-01-12 DIAGNOSIS — E876 Hypokalemia: Secondary | ICD-10-CM | POA: Diagnosis present

## 2023-01-12 DIAGNOSIS — Z853 Personal history of malignant neoplasm of breast: Secondary | ICD-10-CM | POA: Diagnosis not present

## 2023-01-12 DIAGNOSIS — I480 Paroxysmal atrial fibrillation: Secondary | ICD-10-CM | POA: Diagnosis present

## 2023-01-12 DIAGNOSIS — E039 Hypothyroidism, unspecified: Secondary | ICD-10-CM | POA: Diagnosis present

## 2023-01-12 DIAGNOSIS — N390 Urinary tract infection, site not specified: Secondary | ICD-10-CM | POA: Diagnosis not present

## 2023-01-12 DIAGNOSIS — Z79899 Other long term (current) drug therapy: Secondary | ICD-10-CM

## 2023-01-12 DIAGNOSIS — G20A1 Parkinson's disease without dyskinesia, without mention of fluctuations: Secondary | ICD-10-CM | POA: Diagnosis present

## 2023-01-12 DIAGNOSIS — B961 Klebsiella pneumoniae [K. pneumoniae] as the cause of diseases classified elsewhere: Secondary | ICD-10-CM | POA: Diagnosis present

## 2023-01-12 DIAGNOSIS — E871 Hypo-osmolality and hyponatremia: Secondary | ICD-10-CM | POA: Diagnosis present

## 2023-01-12 DIAGNOSIS — Z9221 Personal history of antineoplastic chemotherapy: Secondary | ICD-10-CM

## 2023-01-12 DIAGNOSIS — G629 Polyneuropathy, unspecified: Secondary | ICD-10-CM | POA: Diagnosis present

## 2023-01-12 DIAGNOSIS — R4182 Altered mental status, unspecified: Principal | ICD-10-CM | POA: Diagnosis present

## 2023-01-12 DIAGNOSIS — Z87440 Personal history of urinary (tract) infections: Secondary | ICD-10-CM

## 2023-01-12 DIAGNOSIS — G20C Parkinsonism, unspecified: Secondary | ICD-10-CM | POA: Diagnosis present

## 2023-01-12 DIAGNOSIS — Z882 Allergy status to sulfonamides status: Secondary | ICD-10-CM

## 2023-01-12 DIAGNOSIS — E860 Dehydration: Secondary | ICD-10-CM | POA: Diagnosis present

## 2023-01-12 DIAGNOSIS — G4730 Sleep apnea, unspecified: Secondary | ICD-10-CM | POA: Diagnosis present

## 2023-01-12 LAB — BASIC METABOLIC PANEL
Anion gap: 7 (ref 5–15)
BUN: 16 mg/dL (ref 8–23)
CO2: 29 mmol/L (ref 22–32)
Calcium: 8.3 mg/dL — ABNORMAL LOW (ref 8.9–10.3)
Chloride: 97 mmol/L — ABNORMAL LOW (ref 98–111)
Creatinine, Ser: 0.65 mg/dL (ref 0.44–1.00)
GFR, Estimated: >60 mL/min (ref >60)
Glucose, Bld: 121 mg/dL — ABNORMAL HIGH (ref 70–99)
Potassium: 4.1 mmol/L (ref 3.5–5.1)
Sodium: 133 mmol/L — ABNORMAL LOW (ref 135–145)

## 2023-01-12 LAB — URINALYSIS, W/ REFLEX TO CULTURE (INFECTION SUSPECTED)
Bilirubin Urine: NEGATIVE
Glucose, UA: NEGATIVE mg/dL
Ketones, ur: 5 mg/dL — AB
Nitrite: NEGATIVE
Protein, ur: 100 mg/dL — AB
Specific Gravity, Urine: 1.015 (ref 1.005–1.030)
WBC, UA: >50 WBC/hpf (ref 0–5)
pH: 6 (ref 5.0–8.0)

## 2023-01-12 LAB — CBC
HCT: 40.3 % (ref 36.0–46.0)
Hemoglobin: 13.6 g/dL (ref 12.0–15.0)
MCH: 31.1 pg (ref 26.0–34.0)
MCHC: 33.7 g/dL (ref 30.0–36.0)
MCV: 92 fL (ref 80.0–100.0)
Platelets: 185 10*3/uL (ref 150–400)
RBC: 4.38 MIL/uL (ref 3.87–5.11)
RDW: 13.4 % (ref 11.5–15.5)
WBC: 16 10*3/uL — ABNORMAL HIGH (ref 4.0–10.5)
nRBC: 0 % (ref 0.0–0.2)

## 2023-01-12 LAB — LACTIC ACID, PLASMA: Lactic Acid, Venous: 1.2 mmol/L (ref 0.5–1.9)

## 2023-01-12 LAB — MAGNESIUM: Magnesium: 1.6 mg/dL — ABNORMAL LOW (ref 1.7–2.4)

## 2023-01-12 MED ORDER — HYDRALAZINE HCL 20 MG/ML IJ SOLN: 5.0000 mg | INTRAMUSCULAR | Status: DC | PRN

## 2023-01-12 MED ORDER — MORPHINE SULFATE (PF) 2 MG/ML IV SOLN
2.0000 mg | INTRAVENOUS | Status: DC | PRN
  Administered 2023-01-12: 2 mg via INTRAVENOUS
  Filled 2023-01-12: qty 1

## 2023-01-12 MED ORDER — LACTATED RINGERS IV SOLN: INTRAVENOUS | Status: AC

## 2023-01-12 MED ORDER — SODIUM CHLORIDE 0.9 % IV SOLN
2.0000 g | INTRAVENOUS | Status: DC
  Administered 2023-01-12 – 2023-01-15 (×4): 2 g via INTRAVENOUS
  Filled 2023-01-12: qty 2
  Filled 2023-01-12: qty 20
  Filled 2023-01-12: qty 2
  Filled 2023-01-12: qty 20
  Filled 2023-01-12: qty 2

## 2023-01-12 MED ORDER — MAGNESIUM SULFATE IN D5W 1-5 GM/100ML-% IV SOLN
1.0000 g | Freq: Once | INTRAVENOUS | Status: AC
  Administered 2023-01-13: 1 g via INTRAVENOUS
  Filled 2023-01-12: qty 100

## 2023-01-12 MED ORDER — ACETAMINOPHEN 325 MG PO TABS: 650.0000 mg | ORAL_TABLET | Freq: Four times a day (QID) | ORAL | Status: DC | PRN

## 2023-01-12 MED ORDER — IOHEXOL 300 MG/ML  SOLN
100.0000 mL | Freq: Once | INTRAMUSCULAR | Status: AC | PRN
  Administered 2023-01-12: 100 mL via INTRAVENOUS

## 2023-01-12 MED ORDER — LACTATED RINGERS IV BOLUS (SEPSIS)
1000.0000 mL | Freq: Once | INTRAVENOUS | Status: AC
  Administered 2023-01-12: 1000 mL via INTRAVENOUS

## 2023-01-12 MED ORDER — ACETAMINOPHEN 650 MG RE SUPP
650.0000 mg | Freq: Four times a day (QID) | RECTAL | Status: DC | PRN
  Administered 2023-01-13: 650 mg via RECTAL
  Filled 2023-01-12: qty 1

## 2023-01-12 MED ORDER — SODIUM CHLORIDE 0.9% FLUSH
3.0000 mL | Freq: Two times a day (BID) | INTRAVENOUS | Status: DC
  Administered 2023-01-12 – 2023-01-15 (×7): 3 mL via INTRAVENOUS

## 2023-01-12 NOTE — ED Notes (Signed)
Unable to obtain blood cultures due to pt being a hard stick. 2 nurses made 5 attempts. Lab called and will be down in a little bit. Dr. Arnoldo Morale notified.

## 2023-01-12 NOTE — ED Notes (Signed)
Patient transported to CT 

## 2023-01-12 NOTE — Consult Note (Signed)
CODE SEPSIS - PHARMACY COMMUNICATION  **Broad Spectrum Antibiotics should be administered within 1 hour of Sepsis diagnosis**  Time Code Sepsis Called/Page Received: 1805  Antibiotics Ordered: Ceftriaxone  Time of 1st antibiotic administration: 1822  Additional action taken by pharmacy: none  If necessary, Name of Provider/Nurse Contacted: n/a   Haadi Santellan Rodriguez-Guzman PharmD, BCPS 01/12/2023 7:11 PM

## 2023-01-12 NOTE — H&P (Signed)
History and Physical     Patient: Suzanne Dixon ZOX:[id number] DOB: August 13, 1951 DOA: 01/12/2023 DOS: the patient was seen and examined on 01/13/2023 PCP: Lynnea Ferrier, MD   Patient coming from: Home  Chief Complaint: Somnolence  HISTORY OF PRESENT ILLNESS: Suzanne Dixon is an 72 y.o. female brought by EMS for somnolence from home.  Patient has a history of recent UTI treatment and reported to have foul and strong odor to her urine.  Patient has past medical history of paroxysmal atrial fibrillation, hypertension, prior CVA, obesity.  On my exam patient is sleepy arousable in pain states that she has pain in her bottom.  At baseline patient states that she can walk she has somebody that comes to her home 3 days a week to help her.  But patient lives by herself I suspect that patient needs higher level of care and may need short-term rehab or skilled skilled nursing facility.  Past Medical History:  Diagnosis Date   A-fib (HCC)    Anemia    B12 deficiency    Breast cancer (HCC) 03/31/2013   Right - chemo- mastectomy   Breast cancer (HCC) 1991   Rt.- radiation   Complication of anesthesia 01/2009   Recent years with general anesthesia had itching following surgery   DDD (degenerative disc disease), cervical    Depression    Diverticulitis    Dyspnea    Edema of both legs    GERD (gastroesophageal reflux disease)    Gout    H/O mastectomy 2014   per patient   H/O: cesarean section 1987   per patient report   H/O: hysterectomy 1996   per patient   Hydradenitis    Hypertension    Hypothyroidism    Neuropathy    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HCC)    Parkinson's disease    Personal history of chemotherapy    Personal history of radiation therapy    Ruptured cervical disc 2002   per patient    Sleep apnea    cpap machine   Super obese    Tachycardia    Review of Systems  Unable to perform ROS: Acuity of condition  Neurological:  Positive for weakness.   Allergies   Allergen Reactions   Advair Diskus [Fluticasone-Salmeterol] Other (See Comments)    Joint pain    Alendronate     Muscle pain   Singulair [Montelukast] Other (See Comments)    "muscle pain"   Sulfa Antibiotics Hives   Venlafaxine Other (See Comments)    "altered mental status/tremors"   Past Surgical History:  Procedure Laterality Date   ABDOMINAL HYSTERECTOMY     BACK SURGERY  2002   plate and 2 screws in neck   BREAST BIOPSY Right 1991,2014   Positive   BREAST CYST ASPIRATION Left    BREAST SURGERY Right 2014   mastectomy   CATARACT EXTRACTION W/PHACO Left 05/17/2019   Procedure: CATARACT EXTRACTION PHACO AND INTRAOCULAR LENS PLACEMENT (IOC);  Surgeon: Nevada Crane, MD;  Location: [facility] ORS;  Service: Ophthalmology;  Laterality: Left;  [place] 00:28 CDE 2.15 FLUID PACK LOT # [id number]   CATARACT EXTRACTION W/PHACO Right 06/14/2019   Procedure: CATARACT EXTRACTION PHACO AND INTRAOCULAR LENS PLACEMENT (IOC) RIGHT;  Surgeon: Nevada Crane, MD;  Location: [facility];  Service: Ophthalmology;  Laterality: Right;  [place] 00:35.0 CDE 2.37 Fluid Pack Lot #[id number]   CESAREAN SECTION     CHOLECYSTECTOMY  04/09/2012   COLONOSCOPY WITH PROPOFOL N/A 06/22/2015  Procedure: COLONOSCOPY WITH PROPOFOL;  Surgeon: Scot Jun, MD;  Location: [facility] ENDOSCOPY;  Service: Endoscopy;  Laterality: N/A;   COLONOSCOPY WITH PROPOFOL N/A 05/25/2021   Procedure: COLONOSCOPY WITH PROPOFOL;  Surgeon: Regis Bill, MD;  Location: [facility] ENDOSCOPY;  Service: Endoscopy;  Laterality: N/A;   ESOPHAGOGASTRODUODENOSCOPY     ESOPHAGOGASTRODUODENOSCOPY N/A 05/25/2021   Procedure: ESOPHAGOGASTRODUODENOSCOPY (EGD);  Surgeon: Regis Bill, MD;  Location: [facility] ENDOSCOPY;  Service: Endoscopy;  Laterality: N/A;   EYE SURGERY     LAPAROSCOPIC GASTRIC BYPASS     MASTECTOMY Right    MASTECTOMY MODIFIED RADICAL     mastectomy partial      lumpectomy   NEUROPLASTY / TRANSPOSITION ULNAR NERVE AT ELBOW      OOPHORECTOMY     PORT-A-CATH REMOVAL Left 05/13/2015   Procedure: REMOVAL PORT-A-CATH LEFT CHEST ;  Surgeon: Lattie Haw, MD;  Location: [facility] ORS;  Service: General;  Laterality: Left;   REDUCTION MAMMAPLASTY Left    REVERSE SHOULDER ARTHROPLASTY Right 12/03/2020   Procedure: REVERSE SHOULDER ARTHROPLASTY;  Surgeon: Christena Flake, MD;  Location: [facility] OR[facility];  Service: Orthopedics;  Laterality: Right;   ROUX-EN-Y GASTRIC BYPASS  01/26/2009   SHOULDER ARTHROSCOPY WITH OPEN ROTATOR CUFF REPAIR Right 01/03/2017   Procedure: SHOULDER ARTHROSCOPY WITH OPEN ROTATOR CUFF REPAIR;  Surgeon: Christena Flake, MD;  Location: [facility] ORS;  Service: Orthopedics;  Laterality: Right;   SHOULDER ARTHROSCOPY WITH SUBACROMIAL DECOMPRESSION, ROTATOR CUFF REPAIR AND BICEP TENDON REPAIR Right 01/03/2017   Procedure: SHOULDER ARTHROSCOPY WITH SUBACROMIAL DECOMPRESSION, ROTATOR CUFF REPAIR AND BICEP TENDON REPAIR;  Surgeon: Christena Flake, MD;  Location: [facility] ORS;  Service: Orthopedics;  Laterality: Right;  Limited debridement   SPINE SURGERY     MEDICATIONS: Prior to Admission medications   Medication Sig Start Date End Date Taking? Authorizing Provider  allopurinol (ZYLOPRIM) 300 MG tablet Take 300 mg by mouth daily.     [provider]  Calcium Carb-Cholecalciferol 600-400 MG-UNIT TABS Take 1 tablet by mouth 2 (two) times daily with a meal.    [provider]  carbidopa-levodopa (SINEMET IR) 25-100 MG tablet Take 1.5 tablets by mouth 3 (three) times daily. 06/25/20   [provider]  DULoxetine (CYMBALTA) 60 MG capsule Take 60 mg by mouth 2 (two) times daily. 02/23/21   [provider]  gabapentin (NEURONTIN) 600 MG tablet Take 0.5 tablets (300 mg total) by mouth 3 (three) times daily. 06/21/22   Lynn Ito, MD  Homeopathic Products (PSORIASIS/ECZEMA RELIEF EX) Apply 1 application topically daily as needed (eczema).    [provider]  levothyroxine (SYNTHROID, LEVOTHROID) 50 MCG  tablet Take 50 mcg by mouth daily before breakfast.    [provider]  Magnesium 400 MG CAPS Take 400 mg by mouth daily.    [provider]  metoprolol tartrate (LOPRESSOR) 100 MG tablet Take 1 tablet (100 mg total) by mouth 2 (two) times daily. Reduced from 200 mg. 07/02/21   Darlin Priestly, MD  Multiple Vitamins tablet Take 2 tablets by mouth daily.     [provider]  omeprazole (PRILOSEC) 20 MG capsule Take 20 mg by mouth daily.     [provider]  oxybutynin (DITROPAN) 5 MG tablet Take 5 mg by mouth 2 (two) times daily.    [provider]  triamcinolone (KENALOG) 0.025 % cream Apply 1 application topically 2 (two) times daily as needed. Home med. 07/02/21   Darlin Priestly, MD  vitamin B-12 (CYANOCOBALAMIN) 1000 MCG  tablet Take 1,000 mcg by mouth daily.    [provider]  warfarin (COUMADIN) 1 MG tablet Take 3 mg by mouth daily. Take 9 mg by mouth daily on Monday and Friday. Take 6 mg all other days 04/26/22   [provider]  warfarin (COUMADIN) 6 MG tablet Take 1 tablet (6 mg total) by mouth daily at 4 PM. Reduced from 7 mg. Patient taking differently: Take 6 mg by mouth daily at 4 PM. Take 9 mg by mouth daily on Monday and Friday. Take 6 mg all other days 07/02/21   Darlin Priestly, MD    cefTRIAXone (ROCEPHIN)  IV Stopped (01/12/23 2047)   lactated ringers 50 mL/hr at 01/12/23 2244   magnesium sulfate bolus IVPB 1 g (01/13/23 0006)  ED Course: Pt in Ed awake oriented and pain somnolent easily arousable afebrile O2 sats of 97% on room air, meeting sepsis criteria from urinary tract infection. Vitals:   01/12/23 2245 01/12/23 2300 01/12/23 2330 01/13/23 0000  BP: (!) 145/82 (!) 145/84 132/80 135/79  Pulse: (!) 107 (!) 108 (!) 108 (!) 104  Resp: (!) 36 (!) 31 (!) 34 (!) 29  Temp:    99 F (37.2 C)  TempSrc:    Oral  SpO2: 96% 96% (!) 89% 93%  Weight:      Height:       Total I/O In: 1100 [IV Piggyback:1100] Out: -  SpO2: 93  % Blood work in ed shows: BMP shows hyponatremia of 133 glucose 121 normal kidney function magnesium 1.6 previous LFTs normal LFTs on admission are pending we will add them on. Normal lactic acid at 1.2. Leukocytosis of 16,000. Head CT done due to patient's increased somnolence and was negative. CT of the abdomen and pelvis done as on exam she was grimacing and in severe pain patient states her pain is about 6 out of 10. Results for orders placed or performed during the hospital encounter of 01/12/23 (from the past 72 hour(s))  CBC     Status: Abnormal   Collection Time: 01/12/23  4:30 PM  Result Value Ref Range   WBC 16.0 (H) 4.0 - 10.5 K/uL   RBC 4.38 3.87 - 5.11 MIL/uL   Hemoglobin 13.6 12.0 - 15.0 g/dL   HCT 16.1 09.6 - 04.5 %   MCV 92.0 80.0 - 100.0 fL   MCH 31.1 26.0 - 34.0 pg   MCHC 33.7 30.0 - 36.0 g/dL   RDW 40.9 81.1 - 91.4 %   Platelets 185 150 - 400 K/uL   nRBC 0.0 0.0 - 0.2 %    Comment: Performed at [facility], [address], [place], [place] [address]  Basic metabolic panel     Status: Abnormal   Collection Time: 01/12/23  4:30 PM  Result Value Ref Range   Sodium 133 (L) 135 - 145 mmol/L   Potassium 4.1 3.5 - 5.1 mmol/L   Chloride 97 (L) 98 - 111 mmol/L   CO2 29 22 - 32 mmol/L   Glucose, Bld 121 (H) 70 - 99 mg/dL    Comment: Glucose reference range applies only to samples taken after fasting for at least 8 hours.   BUN 16 8 - 23 mg/dL   Creatinine, Ser 6.21 0.44 - 1.00 mg/dL   Calcium 8.3 (L) 8.9 - 10.3 mg/dL   GFR, Estimated >30 >86 mL/min    Comment: (NOTE) Calculated using the CKD-EPI Creatinine Equation (2021)    Anion gap 7 5 - 15  Comment: Performed at [facility], [address] [place], [place] [address]  Lactic acid, plasma     Status: None   Collection Time: 01/12/23  4:30 PM  Result Value Ref Range   Lactic Acid, Venous 1.2 0.5 - 1.9 mmol/L    Comment: Performed at [facility], [address]  [place], [place] [address]  Magnesium     Status: Abnormal   Collection Time: 01/12/23  4:30 PM  Result Value Ref Range   Magnesium 1.6 (L) 1.7 - 2.4 mg/dL    Comment: Performed at [facility], [address] [place], [place] [address]  Urinalysis, w/ Reflex to Culture (Infection Suspected) -Urine, Clean Catch     Status: Abnormal   Collection Time: 01/12/23  5:37 PM  Result Value Ref Range   Specimen Source URINE, CLEAN CATCH    Color, Urine YELLOW (A) YELLOW   APPearance CLOUDY (A) CLEAR   Specific Gravity, Urine 1.015 1.005 - 1.030   pH 6.0 5.0 - 8.0   Glucose, UA NEGATIVE NEGATIVE mg/dL   Hgb urine dipstick LARGE (A) NEGATIVE   Bilirubin Urine NEGATIVE NEGATIVE   Ketones, ur 5 (A) NEGATIVE mg/dL   Protein, ur 829 (A) NEGATIVE mg/dL   Nitrite NEGATIVE NEGATIVE   Leukocytes,Ua MODERATE (A) NEGATIVE   RBC / HPF 0-5 0 - 5 RBC/hpf   WBC, UA >50 0 - 5 WBC/hpf    Comment:        Reflex urine culture not performed if WBC <=10, OR if Squamous epithelial cells >5. If Squamous epithelial cells >5 suggest recollection.    Bacteria, UA FEW (A) NONE SEEN   Squamous Epithelial / HPF 0-5 0 - 5 /HPF   Mucus PRESENT     Comment: Performed at [facility], [address] [place], [place] [address]    Lab Results  Component Value Date   CREATININE 0.65 01/12/2023   CREATININE 0.50 06/21/2022   CREATININE 0.53 06/20/2022      Latest Ref Rng & Units 01/12/2023    4:30 PM 06/21/2022    7:42 AM 06/20/2022    3:59 AM  CMP  Glucose 70 - 99 mg/dL 086   83   BUN 8 - 23 mg/dL 16   13   Creatinine 5.78 - 1.00 mg/dL 4.69  6.29  5.28   Sodium 135 - 145 mmol/L 133   137   Potassium 3.5 - 5.1 mmol/L 4.1   3.5   Chloride 98 - 111 mmol/L 97   105   CO2 22 - 32 mmol/L 29   25   Calcium 8.9 - 10.3 mg/dL 8.3   8.2   Total Protein 6.5 - 8.1 g/dL   6.5   Total Bilirubin 0.3 - 1.2 mg/dL   1.0   Alkaline Phos 38 - 126 U/L   72   AST 15 - 41 U/L   21   ALT 0 - 44 U/L   <5     Unresulted Labs (From admission, onward)     Start     Ordered   01/13/23 0500  Comprehensive metabolic panel  Tomorrow morning,   STAT        01/12/23 2012   01/13/23 0500  CBC  Tomorrow morning,   STAT        01/12/23 2012   01/13/23 0015  CK  Add-on,   AD        01/13/23 0015   01/12/23 1737  Urine  Culture  Once,   R        01/12/23 1737   01/12/23 1709  Blood Culture (routine x 2)  (Undifferentiated presentation (screening labs and basic nursing orders))  BLOOD CULTURE X 2,   STAT      01/12/23 1709           Pt has received : Orders Placed This Encounter  Procedures   Critical Care    This order was created via procedure documentation    Standing Status:   Standing    Number of Occurrences:   1   Blood Culture (routine x 2)    Standing Status:   Standing    Number of Occurrences:   2   Urine Culture    Standing Status:   Standing    Number of Occurrences:   1   DG Chest Port 1 View    Standing Status:   Standing    Number of Occurrences:   1    Order Specific Question:   Reason for Exam (SYMPTOM  OR DIAGNOSIS REQUIRED)    Answer:   Questionable sepsis - evaluate for abnormality   CT HEAD WO CONTRAST ( )    Standing Status:   Standing    Number of Occurrences:   1   CT ABDOMEN PELVIS W CONTRAST    Standing Status:   Standing    Number of Occurrences:   1    Order Specific Question:   Does the patient have a contrast media/X-ray dye allergy?    Answer:   No    Order Specific Question:   If indicated for the ordered procedure, I authorize the administration of contrast media per Radiology protocol    Answer:   Yes    Order Specific Question:   If indicated for the ordered procedure, I authorize the administration of oral contrast media per Radiology protocol    Answer:   Yes   CBC    Standing Status:   Standing    Number of Occurrences:   1   Basic metabolic panel    Standing Status:   Standing    Number of Occurrences:   1   Urinalysis, w/ Reflex to  Culture (Infection Suspected) -Urine, Clean Catch    Standing Status:   Standing    Number of Occurrences:   1    Order Specific Question:   Specimen Source    Answer:   Urine, Clean Catch [76]   Comprehensive metabolic panel    Standing Status:   Standing    Number of Occurrences:   1   CBC    Standing Status:   Standing    Number of Occurrences:   1   Magnesium    Standing Status:   Standing    Number of Occurrences:   1   CK    Standing Status:   Standing    Number of Occurrences:   1   Diet NPO time specified Except for: Sips with Meds, Ice Chips    Standing Status:   Standing    Number of Occurrences:   1    Order Specific Question:   Except for    Answer:   Sips with Meds    Order Specific Question:   Except for    Answer:   Ice Chips   Document height and weight    Standing Status:   Standing    Number of Occurrences:   1   Assess and  Document Glasgow Coma Scale    Standing Status:   Standing    Number of Occurrences:   1   Document vital signs within 1-hour of fluid bolus completion.  Notify provider of abnormal vital signs despite fluid resuscitation.    Standing Status:   Standing    Number of Occurrences:   1   Refer to Sidebar Report: Sepsis Bundle ED/IP    Sepsis Bundle ED/IP    Standing Status:   Standing    Number of Occurrences:   1   Notify provider for difficulties obtaining IV access    Standing Status:   Standing    Number of Occurrences:   1   Initiate Carrier Fluid Protocol    Standing Status:   Standing    Number of Occurrences:   1   DO NOT delay antibiotics if unable to obtain blood culture.    Standing Status:   Standing    Number of Occurrences:   1   Maintain IV access    Standing Status:   Standing    Number of Occurrences:   1   Vital signs    Standing Status:   Standing    Number of Occurrences:   1   Notify physician (specify)    Standing Status:   Standing    Number of Occurrences:   20    Order Specific Question:   Notify  Physician    Answer:   for pulse less than 55 or greater than 120    Order Specific Question:   Notify Physician    Answer:   for respiratory rate less than 12 or greater than 25    Order Specific Question:   Notify Physician    Answer:   for temperature greater than 100.5 F    Order Specific Question:   Notify Physician    Answer:   for urinary output less than 30 mL/hr for four hours    Order Specific Question:   Notify Physician    Answer:   for systolic BP less than 90 or greater than 160, diastolic BP less than 60 or greater than 100    Order Specific Question:   Notify Physician    Answer:   for new hypoxia w/ oxygen saturations < 88%   Progressive Mobility Protocol: No Restrictions    Standing Status:   Standing    Number of Occurrences:   1   Daily weights    Standing Status:   Standing    Number of Occurrences:   1   Intake and Output    Standing Status:   Standing    Number of Occurrences:   1   Do not place and if present remove PureWick    Standing Status:   Standing    Number of Occurrences:   1   Initiate Oral Care Protocol    Standing Status:   Standing    Number of Occurrences:   1   Initiate Carrier Fluid Protocol    Standing Status:   Standing    Number of Occurrences:   1   RN may order General Admission PRN Orders utilizing "General Admission PRN medications" (through manage orders) for the following patient needs: allergy symptoms (Claritin), cold sores (Carmex), cough (Robitussin DM), eye irritation (Liquifilm Tears), hemorrhoids (Tucks), indigestion (Maalox), minor skin irritation (Hydrocortisone Cream), muscle pain (Ben Gay), nose irritation (saline nasal spray) and sore throat (Chloraseptic spray).    Standing Status:   Standing  Number of Occurrences:   [phone]   Cardiac Monitoring Continuous x 48 hours Indications for use: Other; Other indications for use: sepsis    Standing Status:   Standing    Number of Occurrences:   1    Order Specific Question:    Indications for use:    Answer:   Other    Order Specific Question:   Other indications for use:    Answer:   sepsis   Wound care    Sacral decub.    Standing Status:   Standing    Number of Occurrences:   1   Swallow screen    Standing Status:   Standing    Number of Occurrences:   1   Neuro checks    Standing Status:   Standing    Number of Occurrences:   1   Full code    Standing Status:   Standing    Number of Occurrences:   1    Order Specific Question:   By:    Answer:   Other   Code Sepsis activation.  This occurs automatically when order is signed and prioritizes pharmacy, lab, and radiology services for STAT collections and interventions.  If [facility] downtime, call [facility] [phone]) to activate Code Sepsis.    Standing Status:   Standing    Number of Occurrences:   1    Order Specific Question:   Initiate "Code Sepsis" tracking    Answer:   Yes    Order Specific Question:   Contact [facility] [phone])    Answer:   No    Order Specific Question:   Reason for Consult?    Answer:   tracking   Consult to hospitalist    Standing Status:   Standing    Number of Occurrences:   1    Order Specific Question:   Place call to:    Answer:   [id number]    Order Specific Question:   Reason for Consult    Answer:   Admit   warfarin (COUMADIN) per pharmacy consult    Standing Status:   Standing    Number of Occurrences:   1    Order Specific Question:   Indication:    Answer:   Atrial Fibrillation   Pulse oximetry check with vital signs    Standing Status:   Standing    Number of Occurrences:   1   Oxygen therapy Mode or (Route): Nasal cannula; Liters Per Minute: 2; Keep 02 saturation: greater than 92 %    Standing Status:   Standing    Number of Occurrences:   20    Order Specific Question:   Mode or (Route)    Answer:   Nasal cannula    Order Specific Question:   Liters Per Minute    Answer:   2    Order Specific Question:   Keep 02 saturation    Answer:   greater than  92 %   ED EKG 12-Lead    Standing Status:   Standing    Number of Occurrences:   1    Order Specific Question:   Notes    Answer:   Baseline   EKG 12-Lead    Standing Status:   Standing    Number of Occurrences:   1   Insert peripheral IV X 1    Angiocath size 20G or larger    Standing Status:   Standing    Number of Occurrences:  1   Insert 2nd peripheral IV if not already present.    Standing Status:   Standing    Number of Occurrences:   20   Admit to Inpatient (patient's expected length of stay will be greater than 2 midnights or inpatient only procedure)    Standing Status:   Standing    Number of Occurrences:   1    Order Specific Question:   Hospital Area    Answer:   [facility] [[id number]]    Order Specific Question:   Level of Care    Answer:   Telemetry Cardiac [103]    Order Specific Question:   Covid Evaluation    Answer:   Asymptomatic - no recent exposure (last 10 days) testing not required    Order Specific Question:   Diagnosis    Answer:   Somnolence [161096]    Order Specific Question:   Admitting Physician    Answer:   Darrold Junker    Order Specific Question:   Attending Physician    Answer:   Darrold Junker    Order Specific Question:   Certification:    Answer:   I certify this patient will need inpatient services for at least 2 midnights    Order Specific Question:   Estimated Length of Stay    Answer:   4   Aspiration precautions    Standing Status:   Standing    Number of Occurrences:   1   Fall precautions    Standing Status:   Standing    Number of Occurrences:   1    Meds ordered this encounter  Medications   lactated ringers bolus 1,000 mL   lactated ringers infusion   cefTRIAXone (ROCEPHIN) 2 g in sodium chloride 0.9 % 100 mL IVPB    Order Specific Question:   Antibiotic Indication:    Answer:   UTI   sodium chloride flush (NS) 0.9 % injection 3 mL   OR Linked Order Group    acetaminophen (TYLENOL)  tablet 650 mg    acetaminophen (TYLENOL) suppository 650 mg   morphine (PF) 2 MG/ML injection 2 mg   DISCONTD: hydrALAZINE (APRESOLINE) injection 5 mg   iohexol (OMNIPAQUE) 300 MG/ML solution 100 mL   magnesium sulfate IVPB 1 g 100 mL   hydrALAZINE (APRESOLINE) injection 10 mg   allopurinol (ZYLOPRIM) tablet 300 mg   carbidopa-levodopa (SINEMET IR) 25-100 MG per tablet immediate release 1.5 tablet   DULoxetine (CYMBALTA) DR capsule 60 mg   gabapentin (NEURONTIN) tablet 300 mg   levothyroxine (SYNTHROID) tablet 50 mcg   metoprolol tartrate (LOPRESSOR) tablet 100 mg    Reduced from 200 mg.     oxybutynin (DITROPAN) tablet 5 mg    Admission Imaging : CT ABDOMEN PELVIS W CONTRAST  Result Date: 01/12/2023 CLINICAL DATA:  Abdominal pain, acute (Ped 0-17y) EXAM: CT ABDOMEN AND PELVIS WITH CONTRAST TECHNIQUE: Multidetector CT imaging of the abdomen and pelvis was performed using the standard protocol following bolus administration of intravenous contrast. RADIATION DOSE REDUCTION: This exam was performed according to the departmental dose-optimization program which includes automated exposure control, adjustment of the mA and/or kV according to patient size and/or use of iterative reconstruction technique. CONTRAST:  OMNIPAQUE IOHEXOL 300 MG/ML  SOLN COMPARISON:  06/19/2021 FINDINGS: Lower chest: No acute abnormality Hepatobiliary: Gallbladder not visualized, presumed cholecystectomy. No focal hepatic abnormality. Pancreas: No focal abnormality or ductal dilatation. Spleen: No focal abnormality.  Normal size.  Adrenals/Urinary Tract: Left midpole cyst measures 2.7 cm. Right lower pole cyst measures 4.5 cm. No follow-up imaging recommended. No hydronephrosis. Adrenal glands and urinary bladder unremarkable. Stomach/Bowel: Prior gastric bypass. No complicating feature. Stomach, large and small bowel grossly unremarkable. Vascular/Lymphatic: No evidence of aneurysm or adenopathy. Reproductive: Prior  hysterectomy.  No adnexal masses. Other: No free fluid or free air. Musculoskeletal: No acute bony abnormality. Degenerative changes in the lumbar spine. IMPRESSION: No acute findings in the abdomen or pelvis. Electronically Signed   By: Charlett Nose M.D.   On: 01/12/2023 20:43   CT HEAD WO CONTRAST ( )  Result Date: 01/12/2023 CLINICAL DATA:  Urinary tract infection, altered level of consciousness, lower extremity swelling EXAM: CT HEAD WITHOUT CONTRAST TECHNIQUE: Contiguous axial images were obtained from the base of the skull through the vertex without intravenous contrast. RADIATION DOSE REDUCTION: This exam was performed according to the departmental dose-optimization program which includes automated exposure control, adjustment of the mA and/or kV according to patient size and/or use of iterative reconstruction technique. COMPARISON:  06/19/2022 FINDINGS: Brain: No acute infarct or hemorrhage. Lateral ventricles and midline structures are unremarkable. No acute extra-axial fluid collections. No mass effect. Vascular: No hyperdense vessel or unexpected calcification. Skull: Normal. Negative for fracture or focal lesion. Sinuses/Orbits: No acute finding. Other: None. IMPRESSION: 1. Stable head CT, no acute intracranial process. Electronically Signed   By: Sharlet Salina M.D.   On: 01/12/2023 19:03   DG Chest Port 1 View  Result Date: 01/12/2023 CLINICAL DATA:  Sepsis. EXAM: PORTABLE CHEST 1 VIEW COMPARISON:  June 19, 2022. FINDINGS: Stable cardiomediastinal silhouette. Both lungs are clear. Status post right total shoulder arthroplasty. IMPRESSION: No active disease. Electronically Signed   By: Lupita Raider M.D.   On: 01/12/2023 17:46   Physical Examination: Vitals:   01/12/23 2245 01/12/23 2300 01/12/23 2330 01/13/23 0000  BP: (!) 145/82 (!) 145/84 132/80 135/79  Pulse: (!) 107 (!) 108 (!) 108 (!) 104  Temp:    99 F (37.2 C)  Resp: (!) 36 (!) 31 (!) 34 (!) 29  Height:      Weight:       SpO2: 96% 96% (!) 89% 93%  TempSrc:    Oral  BMI (Calculated):       Physical Exam Vitals and nursing note reviewed.  Constitutional:      General: She is not in acute distress.    Appearance: She is obese. She is ill-appearing. She is not toxic-appearing or diaphoretic.  HENT:     Head: Normocephalic and atraumatic.     Right Ear: Hearing and external ear normal.     Left Ear: Hearing and external ear normal.     Nose: Nose normal. No nasal deformity.     Mouth/Throat:     Lips: Pink.     Mouth: Mucous membranes are moist.     Tongue: No lesions.     Pharynx: Oropharynx is clear.  Eyes:     Extraocular Movements: Extraocular movements intact.     Pupils: Pupils are equal, round, and reactive to light.  Cardiovascular:     Rate and Rhythm: Normal rate and regular rhythm.     Pulses: Normal pulses.     Heart sounds: Normal heart sounds.  Pulmonary:     Effort: Pulmonary effort is normal.     Breath sounds: Normal breath sounds.  Abdominal:     General: Bowel sounds are normal. There is no distension.     Palpations:  Abdomen is soft. There is no mass.     Tenderness: There is abdominal tenderness. There is no guarding.     Hernia: No hernia is present.    Musculoskeletal:     Right lower leg: No edema.     Left lower leg: No edema.  Skin:    General: Skin is warm.  Neurological:     General: No focal deficit present.     Mental Status: She is alert and oriented to person, place, and time.     Cranial Nerves: Cranial nerves 2-12 are intact.     Motor: Motor function is intact.  Psychiatric:        Attention and Perception: Attention normal.        Mood and Affect: Mood normal.        Speech: Speech normal.        Behavior: Behavior normal. Behavior is cooperative.        Cognition and Memory: Cognition normal.     Assessment and Plan: AMS (altered mental status) Altered mental status somnolent secondary to acuity of condition dehydration decreased p.o. intake  and sepsis and urinary tract infection. Aspiration precautions. Fall precautions. Swallow evaluation prior to anything p.o. Neurochecks every 4 hours x 2.  Sepsis secondary to UTI [facility]) Patient meets sepsis criteria vitals white count source of infection. Will follow culture sensitivity. Patient to continue Rocephin started in the emergency room.   Hypertension Vitals:   01/12/23 1626 01/12/23 1830 01/12/23 1900 01/12/23 1939  BP: (!) 152/86 (!) 148/90 (!) 151/86 (!) 149/85   01/12/23 2245 01/12/23 2300 01/12/23 2330 01/13/23 0000  BP: (!) 145/82 (!) 145/84 132/80 135/79  Continue patient's metoprolol and as needed hydralazine.    AF (paroxysmal atrial fibrillation) [facility]) Pharmacy consult for Coumadin. Will continue patient on metoprolol.    DVT prophylaxis:  Coumadin Code Status:  Full code    06/19/2022    3:51 PM  Advanced Directives  Does Patient Have a Medical Advance Directive? Unable to assess, patient is non-responsive or altered mental status    Family Communication:  None Emergency Contact: Contact Information     Name Relation Home Work Hart D Daughter [phone]  [phone]   [facility] Daughter [phone]  [phone]   Johnathan Hausen Sister   [phone]   Tommye Standard Sister   [phone]       Disposition Plan:  To be determined Consults: None Admission status: NP should not Unit / Expected LOS: Medical telemetry/2 to 3 days.  Gertha Calkin MD [facility]  6 PM- 2 AM. [phone]( Pager )  For questions regarding this patient please use [email or web address] to contact the current [facility] MD.   Bonita Quin may also call [phone] to contact current Assigned [facility] Attending/Consulting MD for this patient.

## 2023-01-12 NOTE — Progress Notes (Signed)
Elink following code sepsis °

## 2023-01-12 NOTE — ED Triage Notes (Addendum)
Pt to ED [facility] from home for UTI. Recently finished antibiotics for UTI. Ems reports strong odor to urine. Pt not answering all orientation questions appropriately, lives alone. Swelling noted to lower extremities.

## 2023-01-12 NOTE — ED Notes (Signed)
Allena Katz, MD at bedside for admission assessment

## 2023-01-12 NOTE — ED Notes (Signed)
Per Dr. Arnoldo Morale, start antibiotics before [facility] are drawn - see previous note.

## 2023-01-12 NOTE — H&P (Incomplete)
History and Physical     Patient: Suzanne Dixon DOB: 1951/01/12 DOA: 01/12/2023 DOS: the patient was seen and examined on 01/12/2023 PCP: Lynnea Ferrier, MD   Patient coming from: Home  Chief Complaint: Somnolence  HISTORY OF PRESENT ILLNESS: Suzanne Dixon is an 72 y.o. female brought by EMS for somnolence from home.  Patient has a history of recent UTI treatment and reported to have foul and strong odor to her urine.  Patient has past medical history of paroxysmal atrial fibrillation, hypertension, prior CVA, obesity.  On my exam patient is sleepy arousable in pain states that she has pain in her bottom.  At baseline patient states that she can walk she has somebody that comes to her home 3 days a week to help her.  But patient lives by herself I suspect that patient needs higher level of care and may need short-term rehab or skilled skilled nursing facility.  Past Medical History:  Diagnosis Date  . A-fib (HCC)   . Anemia   . B12 deficiency   . Breast cancer (HCC) 03/31/2013   Right - chemo- mastectomy  . Breast cancer (HCC) 1991   Rt.- radiation  . Complication of anesthesia 01/2009   Recent years with general anesthesia had itching following surgery  . DDD (degenerative disc disease), cervical   . Depression   . Diverticulitis   . Dyspnea   . Edema of both legs   . GERD (gastroesophageal reflux disease)   . Gout   . H/O mastectomy 2014   per patient  . H/O: cesarean section 1987   per patient report  . H/O: hysterectomy 1996   per patient  . Hydradenitis   . Hypertension   . Hypothyroidism   . Neuropathy   . 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HCC)   . Parkinson's disease   . Personal history of chemotherapy   . Personal history of radiation therapy   . Ruptured cervical disc 2002   per patient   . Sleep apnea    cpap machine  . Super obese   . Tachycardia    Review of Systems  Unable to perform ROS: Acuity of condition  Neurological:  Positive for  weakness.   Allergies  Allergen Reactions  . Advair Diskus [Fluticasone-Salmeterol] Other (See Comments)    Joint pain   . Alendronate     Muscle pain  . Singulair [Montelukast] Other (See Comments)    "muscle pain"  . Sulfa Antibiotics Hives  . Venlafaxine Other (See Comments)    "altered mental status/tremors"   Past Surgical History:  Procedure Laterality Date  . ABDOMINAL HYSTERECTOMY    . BACK SURGERY  2002   plate and 2 screws in neck  . BREAST BIOPSY Right 1991,2014   Positive  . BREAST CYST ASPIRATION Left   . BREAST SURGERY Right 2014   mastectomy  . CATARACT EXTRACTION W/PHACO Left 05/17/2019   Procedure: CATARACT EXTRACTION PHACO AND INTRAOCULAR LENS PLACEMENT (IOC);  Surgeon: Nevada Crane, MD;  Location: [facility] ORS;  Service: Ophthalmology;  Laterality: Left;  [place] 00:28 CDE 2.15 FLUID PACK LOT # [id number]  . CATARACT EXTRACTION W/PHACO Right 06/14/2019   Procedure: CATARACT EXTRACTION PHACO AND INTRAOCULAR LENS PLACEMENT (IOC) RIGHT;  Surgeon: Nevada Crane, MD;  Location: [facility] ORS;  Service: Ophthalmology;  Laterality: Right;  [place] 00:35.0 CDE 2.37 Fluid Pack Lot #[id number]  . CESAREAN SECTION    . CHOLECYSTECTOMY  04/09/2012  . COLONOSCOPY WITH PROPOFOL N/A 06/22/2015  Procedure: COLONOSCOPY WITH PROPOFOL;  Surgeon: Scot Jun, MD;  Location: [facility] ENDOSCOPY;  Service: Endoscopy;  Laterality: N/A;  . COLONOSCOPY WITH PROPOFOL N/A 05/25/2021   Procedure: COLONOSCOPY WITH PROPOFOL;  Surgeon: Regis Bill, MD;  Location: [facility] ENDOSCOPY;  Service: Endoscopy;  Laterality: N/A;  . ESOPHAGOGASTRODUODENOSCOPY    . ESOPHAGOGASTRODUODENOSCOPY N/A 05/25/2021   Procedure: ESOPHAGOGASTRODUODENOSCOPY (EGD);  Surgeon: Regis Bill, MD;  Location: [facility] ENDOSCOPY;  Service: Endoscopy;  Laterality: N/A;  . EYE SURGERY    . LAPAROSCOPIC GASTRIC BYPASS    . MASTECTOMY Right   . MASTECTOMY MODIFIED RADICAL    . mastectomy partial      lumpectomy  . NEUROPLASTY  / TRANSPOSITION ULNAR NERVE AT ELBOW    . OOPHORECTOMY    . PORT-A-CATH REMOVAL Left 05/13/2015   Procedure: REMOVAL PORT-A-CATH LEFT CHEST ;  Surgeon: Lattie Haw, MD;  Location: [facility] ORS;  Service: General;  Laterality: Left;  . REDUCTION MAMMAPLASTY Left   . REVERSE SHOULDER ARTHROPLASTY Right 12/03/2020   Procedure: REVERSE SHOULDER ARTHROPLASTY;  Surgeon: Christena Flake, MD;  Location: [facility] ORS;  Service: Orthopedics;  Laterality: Right;  . ROUX-EN-Y GASTRIC BYPASS  01/26/2009  . SHOULDER ARTHROSCOPY WITH OPEN ROTATOR CUFF REPAIR Right 01/03/2017   Procedure: SHOULDER ARTHROSCOPY WITH OPEN ROTATOR CUFF REPAIR;  Surgeon: Christena Flake, MD;  Location: [facility] ORS;  Service: Orthopedics;  Laterality: Right;  . SHOULDER ARTHROSCOPY WITH SUBACROMIAL DECOMPRESSION, ROTATOR CUFF REPAIR AND BICEP TENDON REPAIR Right 01/03/2017   Procedure: SHOULDER ARTHROSCOPY WITH SUBACROMIAL DECOMPRESSION, ROTATOR CUFF REPAIR AND BICEP TENDON REPAIR;  Surgeon: Christena Flake, MD;  Location: [facility] ORS;  Service: Orthopedics;  Laterality: Right;  Limited debridement  . SPINE SURGERY     MEDICATIONS: Prior to Admission medications   Medication Sig Start Date End Date Taking? Authorizing Provider  allopurinol (ZYLOPRIM) 300 MG tablet Take 300 mg by mouth daily.     [provider]  Calcium Carb-Cholecalciferol 600-400 MG-UNIT TABS Take 1 tablet by mouth 2 (two) times daily with a meal.    [provider]  carbidopa-levodopa (SINEMET IR) 25-100 MG tablet Take 1.5 tablets by mouth 3 (three) times daily. 06/25/20   [provider]  DULoxetine (CYMBALTA) 60 MG capsule Take 60 mg by mouth 2 (two) times daily. 02/23/21   [provider]  gabapentin (NEURONTIN) 600 MG tablet Take 0.5 tablets (300 mg total) by mouth 3 (three) times daily. 06/21/22   Lynn Ito, MD  Homeopathic Products (PSORIASIS/ECZEMA RELIEF EX) Apply 1 application topically daily as needed (eczema).    [provider]   levothyroxine (SYNTHROID, LEVOTHROID) 50 MCG tablet Take 50 mcg by mouth daily before breakfast.    [provider]  Magnesium 400 MG CAPS Take 400 mg by mouth daily.    [provider]  metoprolol tartrate (LOPRESSOR) 100 MG tablet Take 1 tablet (100 mg total) by mouth 2 (two) times daily. Reduced from 200 mg. 07/02/21   Darlin Priestly, MD  Multiple Vitamins tablet Take 2 tablets by mouth daily.     [provider]  omeprazole (PRILOSEC) 20 MG capsule Take 20 mg by mouth daily.     [provider]  oxybutynin (DITROPAN) 5 MG tablet Take 5 mg by mouth 2 (two) times daily.    [provider]  triamcinolone (KENALOG) 0.025 % cream Apply 1 application topically 2 (two) times daily as needed. Home med. 07/02/21   Darlin Priestly, MD  vitamin B-12 (CYANOCOBALAMIN) 1000 MCG  tablet Take 1,000 mcg by mouth daily.    [provider]  warfarin (COUMADIN) 1 MG tablet Take 3 mg by mouth daily. Take 9 mg by mouth daily on Monday and Friday. Take 6 mg all other days 04/26/22   [provider]  warfarin (COUMADIN) 6 MG tablet Take 1 tablet (6 mg total) by mouth daily at 4 PM. Reduced from 7 mg. Patient taking differently: Take 6 mg by mouth daily at 4 PM. Take 9 mg by mouth daily on Monday and Friday. Take 6 mg all other days 07/02/21   Darlin Priestly, MD       ED Course: Pt in Ed *** Vitals:   01/12/23 1626 01/12/23 1627  BP: (!) 152/86   Pulse: 90   Resp: 20   Temp: 98 F (36.7 C)   SpO2: 97%   Weight:  92.1 kg  Height:  5\' 4"  (1.626 m)   No intake/output data recorded. SpO2: 97 % Blood work in ed shows: *** Results for orders placed or performed during the hospital encounter of 01/12/23 (from the past 72 hour(s))  CBC     Status: Abnormal   Collection Time: 01/12/23  4:30 PM  Result Value Ref Range   WBC 16.0 (H) 4.0 - 10.5 K/uL   RBC 4.38 3.87 - 5.11 MIL/uL   Hemoglobin 13.6 12.0 - 15.0 g/dL   HCT 16.1 09.6 - 04.5 %   MCV 92.0 80.0 - 100.0  fL   MCH 31.1 26.0 - 34.0 pg   MCHC 33.7 30.0 - 36.0 g/dL   RDW 40.9 81.1 - 91.4 %   Platelets 185 150 - 400 K/uL   nRBC 0.0 0.0 - 0.2 %    Comment: Performed at [facility], [address] [place], [place] [address]  Basic metabolic panel     Status: Abnormal   Collection Time: 01/12/23  4:30 PM  Result Value Ref Range   Sodium 133 (L) 135 - 145 mmol/L   Potassium 4.1 3.5 - 5.1 mmol/L   Chloride 97 (L) 98 - 111 mmol/L   CO2 29 22 - 32 mmol/L   Glucose, Bld 121 (H) 70 - 99 mg/dL    Comment: Glucose reference range applies only to samples taken after fasting for at least 8 hours.   BUN 16 8 - 23 mg/dL   Creatinine, Ser 6.21 0.44 - 1.00 mg/dL   Calcium 8.3 (L) 8.9 - 10.3 mg/dL   GFR, Estimated >30 >86 mL/min    Comment: (NOTE) Calculated using the CKD-EPI Creatinine Equation (2021)    Anion gap 7 5 - 15    Comment: Performed at [facility], [address] [place], [place] [address]  Lactic acid, plasma     Status: None   Collection Time: 01/12/23  4:30 PM  Result Value Ref Range   Lactic Acid, Venous 1.2 0.5 - 1.9 mmol/L    Comment: Performed at [facility], [address] [place], [place] [address]  Urinalysis, w/ Reflex to Culture (Infection Suspected) -Urine, Clean Catch     Status: Abnormal   Collection Time: 01/12/23  5:37 PM  Result Value Ref Range   Specimen Source URINE, CLEAN CATCH    Color, Urine YELLOW (A) YELLOW   APPearance CLOUDY (A) CLEAR   Specific Gravity, Urine 1.015 1.005 - 1.030   pH 6.0 5.0 - 8.0   Glucose, UA NEGATIVE NEGATIVE mg/dL   Hgb urine dipstick LARGE (A) NEGATIVE   Bilirubin Urine  NEGATIVE NEGATIVE   Ketones, ur 5 (A) NEGATIVE mg/dL   Protein, ur 161 (A) NEGATIVE mg/dL   Nitrite NEGATIVE NEGATIVE   Leukocytes,Ua MODERATE (A) NEGATIVE   RBC / HPF 0-5 0 - 5 RBC/hpf   WBC, UA >50 0 - 5 WBC/hpf    Comment:        Reflex urine culture not performed if WBC <=10, OR if Squamous epithelial cells >5. If Squamous  epithelial cells >5 suggest recollection.    Bacteria, UA FEW (A) NONE SEEN   Squamous Epithelial / HPF 0-5 0 - 5 /HPF   Mucus PRESENT     Comment: Performed at [facility], [address] [place], [place] [address]    Lab Results  Component Value Date   CREATININE 0.65 01/12/2023   CREATININE 0.50 06/21/2022   CREATININE 0.53 06/20/2022      Latest Ref Rng & Units 01/12/2023    4:30 PM 06/21/2022    7:42 AM 06/20/2022    3:59 AM  CMP  Glucose 70 - 99 mg/dL 540   83   BUN 8 - 23 mg/dL 16   13   Creatinine 9.81 - 1.00 mg/dL 1.91  4.78  2.95   Sodium 135 - 145 mmol/L 133   137   Potassium 3.5 - 5.1 mmol/L 4.1   3.5   Chloride 98 - 111 mmol/L 97   105   CO2 22 - 32 mmol/L 29   25   Calcium 8.9 - 10.3 mg/dL 8.3   8.2   Total Protein 6.5 - 8.1 g/dL   6.5   Total Bilirubin 0.3 - 1.2 mg/dL   1.0   Alkaline Phos 38 - 126 U/L   72   AST 15 - 41 U/L   21   ALT 0 - 44 U/L   <5    Unresulted Labs (From admission, onward)     Start     Ordered   01/12/23 1737  Urine Culture  Once,   R        01/12/23 1737   01/12/23 1709  Blood Culture (routine x 2)  (Undifferentiated presentation (screening labs and basic nursing orders))  BLOOD CULTURE X 2,   STAT      01/12/23 1709   01/12/23 1629  Lactic acid, plasma  Now then every 2 hours,   STAT      01/12/23 1629           Pt has received : Orders Placed This Encounter  Procedures  . Critical Care    This order was created via procedure documentation    Standing Status:   Standing    Number of Occurrences:   1  . Blood Culture (routine x 2)    Standing Status:   Standing    Number of Occurrences:   2  . Urine Culture    Standing Status:   Standing    Number of Occurrences:   1  . DG Chest Port 1 View    Standing Status:   Standing    Number of Occurrences:   1    Order Specific Question:   Reason for Exam (SYMPTOM  OR DIAGNOSIS REQUIRED)    Answer:   Questionable sepsis - evaluate for abnormality  . CBC     Standing Status:   Standing    Number of Occurrences:   1  . Basic metabolic panel    Standing Status:   Standing    Number  of Occurrences:   1  . Lactic acid, plasma    Standing Status:   Standing    Number of Occurrences:   2  . Urinalysis, w/ Reflex to Culture (Infection Suspected) -Urine, Clean Catch    Standing Status:   Standing    Number of Occurrences:   1    Order Specific Question:   Specimen Source    Answer:   Urine, Clean Catch [76]  . Diet NPO time specified    Standing Status:   Standing    Number of Occurrences:   1  . Document height and weight    Standing Status:   Standing    Number of Occurrences:   1  . Assess and Document Glasgow Coma Scale    Standing Status:   Standing    Number of Occurrences:   1  . Document vital signs within 1-hour of fluid bolus completion.  Notify provider of abnormal vital signs despite fluid resuscitation.    Standing Status:   Standing    Number of Occurrences:   1  . Refer to Sidebar Report: Sepsis Bundle ED/IP    Sepsis Bundle ED/IP    Standing Status:   Standing    Number of Occurrences:   1  . Notify provider for difficulties obtaining IV access    Standing Status:   Standing    Number of Occurrences:   1  . Initiate Carrier Fluid Protocol    Standing Status:   Standing    Number of Occurrences:   1  . DO NOT delay antibiotics if unable to obtain blood culture.    Standing Status:   Standing    Number of Occurrences:   1  . Code Sepsis activation.  This occurs automatically when order is signed and prioritizes pharmacy, lab, and radiology services for STAT collections and interventions.  If [facility] downtime, call [facility] [phone]) to activate Code Sepsis.    Standing Status:   Standing    Number of Occurrences:   1    Order Specific Question:   Initiate "Code Sepsis" tracking    Answer:   Yes    Order Specific Question:   Contact eLink ([phone])    Answer:   No    Order Specific Question:   Reason for Consult?     Answer:   tracking  . pharmacy consult    Standing Status:   Standing    Number of Occurrences:   1    Order Specific Question:   Reason for Consult (*indicate specifics in comment field)    Answer:   Other (see comment)    Order Specific Question:   Comment:    Answer:   Treatment for SEPSIS has been initiated. Follow patient to expedite timely antibiotic and fluid administration.  . Consult to hospitalist    Standing Status:   Standing    Number of Occurrences:   1    Order Specific Question:   Place call to:    Answer:   [id number]    Order Specific Question:   Reason for Consult    Answer:   Admit  . ED EKG 12-Lead    Standing Status:   Standing    Number of Occurrences:   1    Order Specific Question:   Notes    Answer:   Baseline  . Insert peripheral IV X 1    Angiocath size 20G or larger    Standing Status:   Standing  Number of Occurrences:   1  . Insert 2nd peripheral IV if not already present.    Standing Status:   Standing    Number of Occurrences:   20    Meds ordered this encounter  Medications  . lactated ringers bolus 1,000 mL  . lactated ringers infusion  . cefTRIAXone (ROCEPHIN) 2 g in sodium chloride 0.9 % 100 mL IVPB    Order Specific Question:   Antibiotic Indication:    Answer:   UTI    Admission Imaging : DG Chest Port 1 View  Result Date: 01/12/2023 CLINICAL DATA:  Sepsis. EXAM: PORTABLE CHEST 1 VIEW COMPARISON:  June 19, 2022. FINDINGS: Stable cardiomediastinal silhouette. Both lungs are clear. Status post right total shoulder arthroplasty. IMPRESSION: No active disease. Electronically Signed   By: Lupita Raider M.D.   On: 01/12/2023 17:46   Physical Examination: Vitals:   01/12/23 1626 01/12/23 1627  BP: (!) 152/86   Pulse: 90   Temp: 98 F (36.7 C)   Resp: 20   Height:   (1.626 m)  Weight:  92.1 kg  SpO2: 97%   BMI (Calculated):  34.83   Physical Exam  Assessment and Plan: No notes have been filed under this hospital  service. Service: Hospitalist       DVT prophylaxis:  ***  Code Status:  ***     06/19/2022    3:51 PM  Advanced Directives  Does Patient Have a Medical Advance Directive? Unable to assess, patient is non-responsive or altered mental status    Family Communication:  *** Emergency Contact: Contact Information     Name Relation Home Work Lula D Daughter [phone]  [phone]   [facility] Daughter [phone]  [phone]   Johnathan Hausen Sister   [phone]   Dellis Filbert   [phone]       Disposition Plan:  ***  Consults: *** Admission status: ***  Unit / Expected LOS: ***  Gertha Calkin MD [facility]  6 PM- 2 AM. [phone]( Pager )  For questions regarding this patient please use [email or web address] to contact the current [facility] MD.   Bonita Quin may also call [phone] to contact current Assigned [facility] Attending/Consulting MD for this patient.

## 2023-01-12 NOTE — ED Provider Notes (Signed)
[facility] Provider Note    Event Date/Time   First MD Initiated Contact with Patient 01/12/23 1644     (approximate)   History   No chief complaint on file.   HPI  Suzanne Dixon is a 72 y.o. female past medical history significant for paroxysmal atrial fibrillation, hypertension, prior CVA, obesity, who presents to the emergency department with altered mental status.  According to EMS patient recently completed antibiotics for urinary tract infection.  Sent in for altered mental status.  Patient answer some questions and then immediately falls back to sleep.  Requires continuous stimulus in order to get a history.  States that she has not had any falls or trauma.  Feeling more tired and she recently completed antibiotics.  Denies any chest pain or shortness of breath.  Denies any cough.  Denies abdominal pain or known fever.     Physical Exam   Triage Vital Signs: ED Triage Vitals  Enc Vitals Group     BP 01/12/23 1626 (!) 152/86     Pulse Rate 01/12/23 1626 90     Resp 01/12/23 1626 20     Temp 01/12/23 1626 98 F (36.7 C)     Temp src --      SpO2 01/12/23 1626 97 %     Weight 01/12/23 1627 203 lb (92.1 kg)     Height 01/12/23 1627  (1.626 m)     Head Circumference --      Peak Flow --      Pain Score 01/12/23 1627 0     Pain Loc --      Pain Edu? --      Excl. in GC? --     Most recent vital signs: Vitals:   01/12/23 1626  BP: (!) 152/86  Pulse: 90  Resp: 20  Temp: 98 F (36.7 C)  SpO2: 97%    Physical Exam Constitutional:      Appearance: She is well-developed. She is obese.     Comments: Somnolent, arouses to voice and answers questions appropriately  HENT:     Head: Atraumatic.  Eyes:     Conjunctiva/sclera: Conjunctivae normal.     Pupils: Pupils are equal, round, and reactive to light.  Cardiovascular:     Rate and Rhythm: Regular rhythm.  Pulmonary:     Effort: No respiratory distress.     Breath sounds: No  wheezing.  Abdominal:     General: There is no distension.     Tenderness: There is no abdominal tenderness.     Comments: Healed abdominal scar  Musculoskeletal:        General: Normal range of motion.     Cervical back: Normal range of motion.     Right lower leg: No edema.     Left lower leg: No edema.  Skin:    General: Skin is warm.     Capillary Refill: Capillary refill takes less than 2 seconds.  Neurological:     General: No focal deficit present.     Mental Status: She is alert.     IMPRESSION / MDM / ASSESSMENT AND PLAN / ED COURSE  I reviewed the triage vital signs and the nursing notes.  On chart review patient recently called primary care physician for another dose of antibiotics given her ongoing symptoms from a urinary tract infection.  On chart review patient has had recent prior urine cultures that had multiple different organisms, all appear to be  sensitive to Rocephin.  Differential diagnosis including urinary tract infection, sepsis, pneumonia, electrolyte abnormality, dehydration  EKG  No tachycardic or bradycardic dysrhythmias while on cardiac telemetry.  RADIOLOGY I independently reviewed imaging, my interpretation of imaging: Chest x-ray with no signs of pneumonia  LABS (all labs ordered are listed, but only abnormal results are displayed) Labs interpreted as -    Labs Reviewed  CBC - Abnormal; Notable for the following components:      Result Value   WBC 16.0 (*)    All other components within normal limits  BASIC METABOLIC PANEL - Abnormal; Notable for the following components:   Sodium 133 (*)    Chloride 97 (*)    Glucose, Bld 121 (*)    Calcium 8.3 (*)    All other components within normal limits  URINALYSIS, W/ REFLEX TO CULTURE (INFECTION SUSPECTED) - Abnormal; Notable for the following components:   Color, Urine YELLOW (*)    APPearance CLOUDY (*)    Hgb urine dipstick LARGE (*)    Ketones, ur 5 (*)    Protein, ur 100 (*)     Leukocytes,Ua MODERATE (*)    Bacteria, UA FEW (*)    All other components within normal limits  CULTURE, BLOOD (ROUTINE X 2)  CULTURE, BLOOD (ROUTINE X 2)  URINE CULTURE  LACTIC ACID, PLASMA  LACTIC ACID, PLASMA     MDM Patient with significant somnolence on exam.  Has a nonfocal neurologic exam.  No headache or change in vision.  Clinical picture is not consistent with CVA.  Low suspicion for intracranial hemorrhage.  Given her leukocytosis of 16 with altered mental status will obtain blood work including blood cultures.  In and out for urine catheterization.  Leukocytosis of 16.  Creatinine appears to be at her baseline.  No significant electrolyte abnormalities.  Initial lactic acid is 1.2.  Clinical Course as of 01/12/23 1811  Thu Jan 12, 2023  4098 Difficult IV stick and multiple attempts to get blood cultures, given patient's leukocytosis and obvious urinary tract infection, felt that it would be detrimental to the patient to hold antibiotics for blood cultures.  Will give IV Rocephin, IV team called for blood cultures. [SM]    Clinical Course User Index [SM] Corena Herter, MD   On reevaluation continues to be normotensive.  Consulted hospitalist for admission.  PROCEDURES:  Critical Care performed: yes  .Critical Care  Performed by: Corena Herter, MD Authorized by: Corena Herter, MD   Critical care provider statement:    Critical care time (minutes):  30   Critical care time was exclusive of:  Separately billable procedures and treating other patients   Critical care was necessary to treat or prevent imminent or life-threatening deterioration of the following conditions:  Sepsis   Critical care was time spent personally by me on the following activities:  Development of treatment plan with patient or surrogate, discussions with consultants, evaluation of patient's response to treatment, examination of patient, ordering and review of laboratory studies, ordering and  review of radiographic studies, ordering and performing treatments and interventions, pulse oximetry, re-evaluation of patient's condition and review of old charts   Patient's presentation is most consistent with acute presentation with potential threat to life or bodily function.   MEDICATIONS ORDERED IN ED: Medications  lactated ringers bolus 1,000 mL (has no administration in time range)  lactated ringers infusion (has no administration in time range)  cefTRIAXone (ROCEPHIN) 2 g in sodium chloride 0.9 % 100 mL IVPB (  has no administration in time range)    FINAL CLINICAL IMPRESSION(S) / ED DIAGNOSES   Final diagnoses:  Altered mental status, unspecified altered mental status type  Acute cystitis without hematuria     Rx / DC Orders   ED Discharge Orders     None        Note:  This document was prepared using Dragon voice recognition software and may include unintentional dictation errors.   Corena Herter, MD 01/12/23 [phone]

## 2023-01-13 ENCOUNTER — Encounter: Payer: Self-pay | Admitting: Internal Medicine

## 2023-01-13 DIAGNOSIS — A419 Sepsis, unspecified organism: Secondary | ICD-10-CM

## 2023-01-13 DIAGNOSIS — I480 Paroxysmal atrial fibrillation: Secondary | ICD-10-CM

## 2023-01-13 DIAGNOSIS — N390 Urinary tract infection, site not specified: Secondary | ICD-10-CM | POA: Diagnosis not present

## 2023-01-13 DIAGNOSIS — R400 Somnolence: Secondary | ICD-10-CM | POA: Diagnosis not present

## 2023-01-13 LAB — BLOOD CULTURE ID PANEL (REFLEXED) - BCID2

## 2023-01-13 LAB — CULTURE, BLOOD (ROUTINE X 2)

## 2023-01-13 LAB — COMPREHENSIVE METABOLIC PANEL
ALT: 13 U/L (ref 0–44)
AST: 28 U/L (ref 15–41)
Albumin: 3 g/dL — ABNORMAL LOW (ref 3.5–5.0)
Alkaline Phosphatase: 87 U/L (ref 38–126)
Anion gap: 8 (ref 5–15)
BUN: 14 mg/dL (ref 8–23)
CO2: 26 mmol/L (ref 22–32)
Calcium: 8 mg/dL — ABNORMAL LOW (ref 8.9–10.3)
Chloride: 100 mmol/L (ref 98–111)
Creatinine, Ser: 0.63 mg/dL (ref 0.44–1.00)
GFR, Estimated: >60 mL/min (ref >60)
Glucose, Bld: 117 mg/dL — ABNORMAL HIGH (ref 70–99)
Potassium: 3.1 mmol/L — ABNORMAL LOW (ref 3.5–5.1)
Sodium: 134 mmol/L — ABNORMAL LOW (ref 135–145)
Total Bilirubin: 1.7 mg/dL — ABNORMAL HIGH (ref 0.3–1.2)
Total Protein: 6.5 g/dL (ref 6.5–8.1)

## 2023-01-13 LAB — CBC
HCT: 37.1 % (ref 36.0–46.0)
Hemoglobin: 12.7 g/dL (ref 12.0–15.0)
MCH: 31.2 pg (ref 26.0–34.0)
MCHC: 34.2 g/dL (ref 30.0–36.0)
MCV: 91.2 fL (ref 80.0–100.0)
Platelets: 161 10*3/uL (ref 150–400)
RBC: 4.07 MIL/uL (ref 3.87–5.11)
RDW: 13.4 % (ref 11.5–15.5)
WBC: 23.8 10*3/uL — ABNORMAL HIGH (ref 4.0–10.5)
nRBC: 0 % (ref 0.0–0.2)

## 2023-01-13 LAB — HEPARIN LEVEL (UNFRACTIONATED): Heparin Unfractionated: 0.4 IU/mL (ref 0.30–0.70)

## 2023-01-13 LAB — PROTIME-INR
INR: 1.9 — ABNORMAL HIGH (ref 0.8–1.2)
Prothrombin Time: 21.3 seconds — ABNORMAL HIGH (ref 11.4–15.2)

## 2023-01-13 LAB — CK: Total CK: 54 U/L (ref 38–234)

## 2023-01-13 MED ORDER — HYDRALAZINE HCL 20 MG/ML IJ SOLN: 10.0000 mg | Freq: Four times a day (QID) | INTRAMUSCULAR | Status: DC | PRN

## 2023-01-13 MED ORDER — HEPARIN BOLUS VIA INFUSION
4000.0000 [IU] | Freq: Once | INTRAVENOUS | Status: AC
  Administered 2023-01-13: 4000 [IU] via INTRAVENOUS
  Filled 2023-01-13: qty 4000

## 2023-01-13 MED ORDER — DULOXETINE HCL 30 MG PO CPEP
60.0000 mg | ORAL_CAPSULE | Freq: Two times a day (BID) | ORAL | Status: DC
  Administered 2023-01-13 – 2023-01-16 (×7): 60 mg via ORAL
  Filled 2023-01-13 (×6): qty 2
  Filled 2023-01-13: qty 1

## 2023-01-13 MED ORDER — WARFARIN SODIUM 6 MG PO TABS
6.0000 mg | ORAL_TABLET | ORAL | Status: DC
  Administered 2023-01-14: 6 mg via ORAL
  Filled 2023-01-13: qty 1

## 2023-01-13 MED ORDER — POTASSIUM CHLORIDE 10 MEQ/100ML IV SOLN
10.0000 meq | INTRAVENOUS | Status: AC
  Administered 2023-01-13 (×3): 10 meq via INTRAVENOUS
  Filled 2023-01-13 (×2): qty 100

## 2023-01-13 MED ORDER — CARBIDOPA-LEVODOPA 25-100 MG PO TABS
1.5000 | ORAL_TABLET | Freq: Three times a day (TID) | ORAL | Status: DC
  Administered 2023-01-13 – 2023-01-16 (×10): 1.5 via ORAL
  Filled 2023-01-13: qty 1.5
  Filled 2023-01-13: qty 2
  Filled 2023-01-13: qty 1.5
  Filled 2023-01-13 (×3): qty 2
  Filled 2023-01-13: qty 1.5
  Filled 2023-01-13 (×4): qty 2

## 2023-01-13 MED ORDER — LEVOTHYROXINE SODIUM 50 MCG PO TABS
50.0000 ug | ORAL_TABLET | Freq: Every day | ORAL | Status: DC
  Administered 2023-01-15 – 2023-01-16 (×2): 50 ug via ORAL
  Filled 2023-01-13 (×3): qty 1

## 2023-01-13 MED ORDER — ALLOPURINOL 100 MG PO TABS
300.0000 mg | ORAL_TABLET | Freq: Every day | ORAL | Status: DC
  Administered 2023-01-13 – 2023-01-16 (×4): 300 mg via ORAL
  Filled 2023-01-13 (×2): qty 3
  Filled 2023-01-13: qty 1
  Filled 2023-01-13: qty 3

## 2023-01-13 MED ORDER — WARFARIN SODIUM 3 MG PO TABS
3.0000 mg | ORAL_TABLET | ORAL | Status: DC
  Administered 2023-01-13: 3 mg via ORAL
  Filled 2023-01-13 (×2): qty 1

## 2023-01-13 MED ORDER — HEPARIN (PORCINE) 25000 UT/250ML-% IV SOLN
900.0000 [IU]/h | INTRAVENOUS | Status: DC
  Administered 2023-01-13: 900 [IU]/h via INTRAVENOUS
  Filled 2023-01-13: qty 250

## 2023-01-13 MED ORDER — METOPROLOL TARTRATE 50 MG PO TABS
100.0000 mg | ORAL_TABLET | Freq: Two times a day (BID) | ORAL | Status: DC
  Administered 2023-01-13 – 2023-01-16 (×7): 100 mg via ORAL
  Filled 2023-01-13 (×2): qty 2
  Filled 2023-01-13: qty 4
  Filled 2023-01-13 (×4): qty 2

## 2023-01-13 MED ORDER — WARFARIN SODIUM 6 MG PO TABS: 6.0000 mg | ORAL_TABLET | Freq: Every day | ORAL | Status: DC

## 2023-01-13 MED ORDER — GABAPENTIN 300 MG PO CAPS
300.0000 mg | ORAL_CAPSULE | Freq: Three times a day (TID) | ORAL | Status: DC
  Administered 2023-01-13 – 2023-01-16 (×10): 300 mg via ORAL
  Filled 2023-01-13 (×10): qty 1

## 2023-01-13 MED ORDER — POTASSIUM CITRATE-CITRIC ACID 1100-334 MG/5ML PO SOLN
10.0000 meq | Freq: Three times a day (TID) | ORAL | Status: DC
  Administered 2023-01-13 – 2023-01-16 (×10): 10 meq via ORAL
  Filled 2023-01-13 (×12): qty 5

## 2023-01-13 MED ORDER — WARFARIN - PHARMACIST DOSING INPATIENT
Freq: Every day | Status: DC
  Filled 2023-01-13: qty 1

## 2023-01-13 MED ORDER — OXYBUTYNIN CHLORIDE 5 MG PO TABS
5.0000 mg | ORAL_TABLET | Freq: Two times a day (BID) | ORAL | Status: DC
  Administered 2023-01-13 – 2023-01-16 (×7): 5 mg via ORAL
  Filled 2023-01-13 (×8): qty 1

## 2023-01-13 NOTE — Progress Notes (Signed)
Progress Note   Patient: Suzanne Dixon UJW:[id number] DOB: 05/15/1951 DOA: 01/12/2023     1 DOS: the patient was seen and examined on 01/13/2023   Brief hospital course:  72 y.o. female brought by EMS for somnolence from home.  Patient has a history of recent UTI treatment and reported to have foul and strong odor to her urine.  Patient has past medical history of paroxysmal atrial fibrillation, hypertension, prior CVA, obesity.  On my exam patient is sleepy arousable in pain states that she has pain in her bottom.  At baseline patient states that she can walk she has somebody that comes to her home 3 days a week to help her.  But patient lives by herself I suspect that patient needs higher level of care and may need short-term rehab or skilled skilled nursing facility.   4/26 : Patient back to baseline.  Symptoms attributed to urosepsis.  Patient bacteremic with E. coli and Enterobacterales.  Sensitive to ceftriaxone.  Ceftriaxone continued.  Assessment and Plan: AMS (altered mental status)  Altered mental status somnolent secondary to acuity of condition dehydration decreased p.o. intake and sepsis and urinary tract infection. Aspiration precautions. Fall precautions. Swallow evaluation prior to anything p.o. Neurochecks every 4 hours x 2.  Sepsis secondary to UTI [facility]) Patient meets sepsis criteria vitals white count source of infection. Will follow culture sensitivity. Patient to continue Rocephin started in the emergency room.   Hypertension Vitals:   01/12/23 1626 01/12/23 1830 01/12/23 1900 01/12/23 1939  BP: (!) 152/86 (!) 148/90 (!) 151/86 (!) 149/85   01/12/23 2245 01/12/23 2300 01/12/23 2330 01/13/23 0000  BP: (!) 145/82 (!) 145/84 132/80 135/79  Continue patient's metoprolol and as needed hydralazine.    AF (paroxysmal atrial fibrillation) [facility]) Pharmacy consult for Coumadin. Will continue patient on metoprolol.      Subjective: Patient seen and examined this  morning.  Vital labs and imaging reviewed.  Labs with hypokalemia and hypomagnesemia repleted.  Blood cultures positive forEnterobacterales and E. coli.  Patient with no active complaint.  Physical Exam: Vitals:   01/13/23 1000 01/13/23 1015 01/13/23 1017 01/13/23 1030  BP: 129/78 (!) 144/83 (!) 144/83 (!) 134/90  Pulse: 84 89 83 84  Resp: 20 (!) 28 16 (!) 21  Temp:   97.6 F (36.4 C)   TempSrc:   Oral   SpO2: 100% 97% 99% 100%  Weight:      Height:      Physical Exam Constitutional:      Appearance: Normal appearance.  HENT:     Head: Normocephalic.     Nose: Nose normal.  Eyes:     Extraocular Movements: Extraocular movements intact.     Pupils: Pupils are equal, round, and reactive to light.  Cardiovascular:     Rate and Rhythm: Normal rate and regular rhythm.     Pulses: Normal pulses.  Pulmonary:     Effort: Pulmonary effort is normal.  Abdominal:     Palpations: Abdomen is soft.  Musculoskeletal:        General: Normal range of motion.  Skin:    General: Skin is warm.  Neurological:     General: No focal deficit present.     Mental Status: She is alert and oriented to person, place, and time.  Psychiatric:        Mood and Affect: Mood normal.     Data Reviewed:  Results are pending, will review when available.  Family Communication: None by bedside  Disposition: Status is: Inpatient Remains inpatient appropriate because: Urosepsis and bacteremia  Planned Discharge Destination: Home    Time spent: 32 minutes  Author: Kirstie Peri, MD 01/13/2023 11:45 AM  For on call review [email or web address].

## 2023-01-13 NOTE — Assessment & Plan Note (Signed)
Patient meets sepsis criteria vitals white count source of infection. Will follow culture sensitivity. Patient to continue Rocephin started in the emergency room.

## 2023-01-13 NOTE — Assessment & Plan Note (Signed)
Vitals:   01/12/23 1626 01/12/23 1830 01/12/23 1900 01/12/23 1939  BP: (!) 152/86 (!) 148/90 (!) 151/86 (!) 149/85   01/12/23 2245 01/12/23 2300 01/12/23 2330 01/13/23 0000  BP: (!) 145/82 (!) 145/84 132/80 135/79  Continue patient's metoprolol and as needed hydralazine.

## 2023-01-13 NOTE — Assessment & Plan Note (Signed)
Altered mental status somnolent secondary to acuity of condition dehydration decreased p.o. intake and sepsis and urinary tract infection. Aspiration precautions. Fall precautions. Swallow evaluation prior to anything p.o. Neurochecks every 4 hours x 2.

## 2023-01-13 NOTE — Progress Notes (Addendum)
ANTICOAGULATION CONSULT NOTE - Initial Consult  Pharmacy Consult for Heparin, Warfarin  Indication: atrial fibrillation (heparin bridging)   Allergies  Allergen Reactions   Advair Diskus [Fluticasone-Salmeterol] Other (See Comments)    Joint pain    Alendronate     Muscle pain   Singulair [Montelukast] Other (See Comments)    "muscle pain"   Sulfa Antibiotics Hives   Venlafaxine Other (See Comments)    "altered mental status/tremors"    Patient Measurements: Height: 5\' 4"  (162.6 cm) Weight: 92.1 kg (203 lb) IBW/kg (Calculated) : 54.7 Heparin Dosing Weight: 75.5 kg   Vital Signs: Temp: 97.6 F (36.4 C) (04/26 1017) Temp Source: Oral (04/26 1017) BP: 132/80 (04/26 1300) Pulse Rate: 80 (04/26 1300)  Labs: Recent Labs    01/12/23 1630 01/13/23 0042 01/13/23 0435 01/13/23 1134  HGB 13.6  --  12.7  --   HCT 40.3  --  37.1  --   PLT 185  --  161  --   LABPROT  --  21.3*  --   --   INR  --  1.9*  --   --   HEPARINUNFRC  --   --   --  0.40  CREATININE 0.65  --  0.63  --   CKTOTAL 54  --   --   --      Estimated Creatinine Clearance: 67.9 mL/min (by C-G formula based on SCr of 0.63 mg/dL).   Medical History: Past Medical History:  Diagnosis Date   A-fib (HCC)    Anemia    B12 deficiency    Breast cancer (HCC) 03/31/2013   Right - chemo- mastectomy   Breast cancer (HCC) 1991   Rt.- radiation   Complication of anesthesia 01/2009   Recent years with general anesthesia had itching following surgery   DDD (degenerative disc disease), cervical    Depression    Diverticulitis    Dyspnea    Edema of both legs    GERD (gastroesophageal reflux disease)    Gout    H/O mastectomy 2014   per patient   H/O: cesarean section 1987   per patient report   H/O: hysterectomy 1996   per patient   Hydradenitis    Hypertension    Hypothyroidism    Neuropathy    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HCC)    Parkinson's disease    Personal history of chemotherapy    Personal  history of radiation therapy    Ruptured cervical disc 2002   per patient    Sleep apnea    cpap machine   Super obese    Tachycardia     Medications:  (Not in a hospital admission)  Assessment: Pharmacy consulted to dose heparin and warfarin for Afib in this 72 year old female admitted with AMS, sepsis from UTI.   Pt was on warfarin 3 mg PO Q Mon and Fri and warfarin 6 mg PO Q Tue,Wed,Thur,Sat,Sun  PTA,  last dose on 4/25 @ 1100.    INR on 4/26 = 1.9 (subtherapeutic).   Pt failed swallow screen and currently cannot take PO.  MD wants to switch/bridge with heparin until pt can take PO. CrCl = 67.9 ml/min  4/26 11:24 HL 0.4, therapeutic  Goal of Therapy:  INR 2-3 Heparin level 0.3-0.7 units/ml Monitor platelets by anticoagulation protocol: Yes   Plan:  4/26 11:24 HL 0.4 is therapeutic. Patient is now able to take orals, will stop Heparin infusion and continue with home dose of warfarin. Daily  INR checks.  Clovia Cuff, PharmD, BCPS 01/13/2023 1:48 PM

## 2023-01-13 NOTE — Assessment & Plan Note (Signed)
Pharmacy consult for Coumadin. Will continue patient on metoprolol.

## 2023-01-13 NOTE — ED Notes (Signed)
Called for transport for patient. 

## 2023-01-13 NOTE — Progress Notes (Signed)
ANTICOAGULATION CONSULT NOTE - Initial Consult  Pharmacy Consult for Heparin, Warfarin  Indication: atrial fibrillation (heparin bridging)   Allergies  Allergen Reactions   Advair Diskus [Fluticasone-Salmeterol] Other (See Comments)    Joint pain    Alendronate     Muscle pain   Singulair [Montelukast] Other (See Comments)    "muscle pain"   Sulfa Antibiotics Hives   Venlafaxine Other (See Comments)    "altered mental status/tremors"    Patient Measurements: Height: 5\' 4"  (162.6 cm) Weight: 92.1 kg (203 lb) IBW/kg (Calculated) : 54.7 Heparin Dosing Weight: 75.5 kg   Vital Signs: Temp: 99 F (37.2 C) (04/26 0000) Temp Source: Oral (04/26 0000) BP: 145/80 (04/26 0030) Pulse Rate: 101 (04/26 0030)  Labs: Recent Labs    01/12/23 1630 01/13/23 0042  HGB 13.6  --   HCT 40.3  --   PLT 185  --   LABPROT  --  21.3*  INR  --  1.9*  CREATININE 0.65  --   CKTOTAL 54  --     Estimated Creatinine Clearance: 67.9 mL/min (by C-G formula based on SCr of 0.65 mg/dL).   Medical History: Past Medical History:  Diagnosis Date   A-fib (HCC)    Anemia    B12 deficiency    Breast cancer (HCC) 03/31/2013   Right - chemo- mastectomy   Breast cancer (HCC) 1991   Rt.- radiation   Complication of anesthesia 01/2009   Recent years with general anesthesia had itching following surgery   DDD (degenerative disc disease), cervical    Depression    Diverticulitis    Dyspnea    Edema of both legs    GERD (gastroesophageal reflux disease)    Gout    H/O mastectomy 2014   per patient   H/O: cesarean section 1987   per patient report   H/O: hysterectomy 1996   per patient   Hydradenitis    Hypertension    Hypothyroidism    Neuropathy    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HCC)    Parkinson's disease    Personal history of chemotherapy    Personal history of radiation therapy    Ruptured cervical disc 2002   per patient    Sleep apnea    cpap machine   Super obese    Tachycardia      Medications:  (Not in a hospital admission)   Assessment: Pharmacy consulted to dose heparin and warfarin for Afib in this 72 year old female admitted with AMS, sepsis from UTI.   Pt was on warfarin 9 mg PO Q Mon and Fri and warfarin 6 mg PO Q Tue,Wed,Thur,Sat,Sun  PTA,  last dose on 4/25 @ 1100.    INR on 4/26 = 1.9 (subtherapeutic).   Pt failed swallow screen and currently cannot take PO.  MD wants to switch/bridge with heparin until pt can take PO. CrCl = 67.9 ml/min  Goal of Therapy:  INR 2-3 Heparin level 0.3-0.7 units/ml Monitor platelets by anticoagulation protocol: Yes   Plan:  Will order heparin 4000 units IV X bolus and start drip at 900 units/hr. Will recheck HL and INR 8 hrs after start of drip.  Will check CBC daily. Follow until PO can be re-established.   Shatia Sindoni D 01/13/2023,1:21 AM

## 2023-01-13 NOTE — Progress Notes (Signed)
PHARMACY - PHYSICIAN COMMUNICATION CRITICAL VALUE ALERT - BLOOD CULTURE IDENTIFICATION (BCID)  Suzanne Dixon is an 72 y.o. female who presented to [facility] on 01/12/2023 with a chief complaint of somnolence and recent history of UTI.  Assessment:  Blood culture shows 1 of 2 aerobic bottles with GNR, recent treatment for UTI  Name of physician (or Provider) Contacted: Lucianne Muss  Current antibiotics: Ceftriaxone 2g IV q24h  Changes to prescribed antibiotics recommended:  Patient is on recommended antibiotics - No changes needed  Results for orders placed or performed during the hospital encounter of 01/12/23  Blood Culture ID Panel (Reflexed) (Collected: 01/12/2023  5:37 PM)  Result Value Ref Range   Enterococcus faecalis NOT DETECTED NOT DETECTED   Enterococcus Faecium NOT DETECTED NOT DETECTED   Listeria monocytogenes NOT DETECTED NOT DETECTED   Staphylococcus species NOT DETECTED NOT DETECTED   Staphylococcus aureus (BCID) NOT DETECTED NOT DETECTED   Staphylococcus epidermidis NOT DETECTED NOT DETECTED   Staphylococcus lugdunensis NOT DETECTED NOT DETECTED   Streptococcus species NOT DETECTED NOT DETECTED   Streptococcus agalactiae NOT DETECTED NOT DETECTED   Streptococcus pneumoniae NOT DETECTED NOT DETECTED   Streptococcus pyogenes NOT DETECTED NOT DETECTED   A.calcoaceticus-baumannii NOT DETECTED NOT DETECTED   Bacteroides fragilis NOT DETECTED NOT DETECTED   Enterobacterales DETECTED (A) NOT DETECTED   Enterobacter cloacae complex NOT DETECTED NOT DETECTED   Escherichia coli DETECTED (A) NOT DETECTED   Klebsiella aerogenes NOT DETECTED NOT DETECTED   Klebsiella oxytoca NOT DETECTED NOT DETECTED   Klebsiella pneumoniae NOT DETECTED NOT DETECTED   Proteus species NOT DETECTED NOT DETECTED   Salmonella species NOT DETECTED NOT DETECTED   Serratia marcescens NOT DETECTED NOT DETECTED   Haemophilus influenzae NOT DETECTED NOT DETECTED   Neisseria meningitidis NOT DETECTED NOT  DETECTED   Pseudomonas aeruginosa NOT DETECTED NOT DETECTED   Stenotrophomonas maltophilia NOT DETECTED NOT DETECTED   Candida albicans NOT DETECTED NOT DETECTED   Candida auris NOT DETECTED NOT DETECTED   Candida glabrata NOT DETECTED NOT DETECTED   Candida krusei NOT DETECTED NOT DETECTED   Candida parapsilosis NOT DETECTED NOT DETECTED   Candida tropicalis NOT DETECTED NOT DETECTED   Cryptococcus neoformans/gattii NOT DETECTED NOT DETECTED   CTX-M ESBL NOT DETECTED NOT DETECTED   Carbapenem resistance IMP NOT DETECTED NOT DETECTED   Carbapenem resistance KPC NOT DETECTED NOT DETECTED   Carbapenem resistance NDM NOT DETECTED NOT DETECTED   Carbapenem resist OXA 48 LIKE NOT DETECTED NOT DETECTED   Carbapenem resistance VIM NOT DETECTED NOT DETECTED    Clovia Cuff, PharmD, BCPS 01/13/2023 7:34 AM

## 2023-01-14 DIAGNOSIS — R400 Somnolence: Secondary | ICD-10-CM | POA: Diagnosis not present

## 2023-01-14 DIAGNOSIS — N390 Urinary tract infection, site not specified: Secondary | ICD-10-CM | POA: Diagnosis not present

## 2023-01-14 DIAGNOSIS — I480 Paroxysmal atrial fibrillation: Secondary | ICD-10-CM | POA: Diagnosis not present

## 2023-01-14 DIAGNOSIS — A419 Sepsis, unspecified organism: Secondary | ICD-10-CM | POA: Diagnosis not present

## 2023-01-14 LAB — CULTURE, BLOOD (ROUTINE X 2)

## 2023-01-14 LAB — BASIC METABOLIC PANEL
Anion gap: 7 (ref 5–15)
BUN: 15 mg/dL (ref 8–23)
CO2: 26 mmol/L (ref 22–32)
Calcium: 7.8 mg/dL — ABNORMAL LOW (ref 8.9–10.3)
Chloride: 100 mmol/L (ref 98–111)
Creatinine, Ser: 0.66 mg/dL (ref 0.44–1.00)
GFR, Estimated: >60 mL/min (ref >60)
Glucose, Bld: 91 mg/dL (ref 70–99)
Potassium: 4.1 mmol/L (ref 3.5–5.1)
Sodium: 133 mmol/L — ABNORMAL LOW (ref 135–145)

## 2023-01-14 LAB — CBC
HCT: 33.4 % — ABNORMAL LOW (ref 36.0–46.0)
Hemoglobin: 11.4 g/dL — ABNORMAL LOW (ref 12.0–15.0)
MCH: 31.2 pg (ref 26.0–34.0)
MCHC: 34.1 g/dL (ref 30.0–36.0)
MCV: 91.5 fL (ref 80.0–100.0)
Platelets: 144 10*3/uL — ABNORMAL LOW (ref 150–400)
RBC: 3.65 MIL/uL — ABNORMAL LOW (ref 3.87–5.11)
RDW: 13.5 % (ref 11.5–15.5)
WBC: 11.7 10*3/uL — ABNORMAL HIGH (ref 4.0–10.5)
nRBC: 0 % (ref 0.0–0.2)

## 2023-01-14 LAB — PROTIME-INR
INR: 1.8 — ABNORMAL HIGH (ref 0.8–1.2)
Prothrombin Time: 20.6 seconds — ABNORMAL HIGH (ref 11.4–15.2)

## 2023-01-14 NOTE — Progress Notes (Addendum)
ANTICOAGULATION CONSULT NOTE  Pharmacy Consult for Warfarin  Indication: atrial fibrillation  Patient Measurements: Height: 5\' 4"  (162.6 cm) Weight: 98 kg (216 lb 0.8 oz) IBW/kg (Calculated) : 54.7  Labs: Recent Labs    01/12/23 1630 01/13/23 0042 01/13/23 0435 01/13/23 1134 01/14/23 0427  HGB 13.6  --  12.7  --  11.4*  HCT 40.3  --  37.1  --  33.4*  PLT 185  --  161  --  144*  LABPROT  --  21.3*  --   --  20.6*  INR  --  1.9*  --   --  1.8*  HEPARINUNFRC  --   --   --  0.40  --   CREATININE 0.65  --  0.63  --  0.66  CKTOTAL 54  --   --   --   --     Estimated Creatinine Clearance: 70.1 mL/min (by C-G formula based on SCr of 0.66 mg/dL).  Medical History: Past Medical History:  Diagnosis Date   A-fib (HCC)    Anemia    B12 deficiency    Breast cancer (HCC) 03/31/2013   Right - chemo- mastectomy   Breast cancer (HCC) 1991   Rt.- radiation   Complication of anesthesia 01/2009   Recent years with general anesthesia had itching following surgery   DDD (degenerative disc disease), cervical    Depression    Diverticulitis    Dyspnea    Edema of both legs    GERD (gastroesophageal reflux disease)    Gout    H/O mastectomy 2014   per patient   H/O: cesarean section 1987   per patient report   H/O: hysterectomy 1996   per patient   Hydradenitis    Hypertension    Hypothyroidism    Neuropathy    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HCC)    Parkinson's disease    Personal history of chemotherapy    Personal history of radiation therapy    Ruptured cervical disc 2002   per patient    Sleep apnea    cpap machine   Super obese    Tachycardia     Assessment: Pharmacy consulted to dose heparin and warfarin for Afib in this 72 year old female admitted with AMS, sepsis from UTI. Pt was on warfarin 3 mg PO Q Mon and Fri and warfarin 6 mg PO Q Tue,Wed,Thur,Sat,Sun  PTA,  last dose on 4/25 @ 1100. INR on 4/26 = 1.9 (subtherapeutic).   Date INR Plan  4/25 N/A 6 mg (PTA)  4/25  N/A Dose missed  4/26 1.9 3 mg  4/27 1.8 6 mg   DDI: Ceftriaxone  Goal of Therapy:  INR 2-3   Plan:  --INR slightly subtherapeutic. Patient missed warfarin dose 4/25 --Continue on home warfarin regimen as ordered, patient will receive warfarin 6 mg tonight. May be more sensitive to warfarin while on antibiotics --Daily INR per protocol --CBC per protocol  Tressie Ellis 01/14/2023 8:10 AM

## 2023-01-14 NOTE — Progress Notes (Signed)
. Progress Note   Patient: Suzanne Dixon JYN:[id number] DOB: 1951-03-11 DOA: 01/12/2023     2 DOS: the patient was seen and examined on 01/14/2023   Brief hospital course:  72 y.o. female brought by EMS for somnolence from home.  Patient has a history of recent UTI treatment and reported to have foul and strong odor to her urine.  Patient has past medical history of paroxysmal atrial fibrillation, hypertension, prior CVA, obesity.  On my exam patient is sleepy arousable in pain states that she has pain in her bottom.  At baseline patient states that she can walk she has somebody that comes to her home 3 days a week to help her.  But patient lives by herself I suspect that patient needs higher level of care and may need short-term rehab or skilled skilled nursing facility.   4/26 : Patient back to baseline.  Symptoms attributed to urosepsis.  Patient bacteremic with E. coli and Enterobacterales.  Sensitive to ceftriaxone.  Ceftriaxone continued.  4/27 : Patient continues to stay asymptomatic.  IV antibiotics continued.  AMS is completely resolved.  Repeat blood cultures pending  Assessment and Plan: AMS (altered mental status) -was attributed to UTI which resolved in the hospital course. Altered mental status somnolent secondary to acuity of condition dehydration decreased p.o. intake and sepsis and urinary tract infection. Aspiration precautions. Fall precautions. Swallow evaluation prior to anything p.o. Neurochecks every 4 hours x 2.  Sepsis secondary to UTI [facility]) Patient meets sepsis criteria vitals white count source of infection. Will follow culture sensitivity. Patient to continue Rocephin started in the emergency room.   Hypertension Vitals:   01/12/23 1626 01/12/23 1830 01/12/23 1900 01/12/23 1939  BP: (!) 152/86 (!) 148/90 (!) 151/86 (!) 149/85   01/12/23 2245 01/12/23 2300 01/12/23 2330 01/13/23 0000  BP: (!) 145/82 (!) 145/84 132/80 135/79  Continue patient's metoprolol and  as needed hydralazine.    AF (paroxysmal atrial fibrillation) [facility]) Pharmacy consult for Coumadin. Will continue patient on metoprolol.      Subjective: Patient seen and examined this morning.  Vital labs and imaging reviewed.   Physical Exam: Vitals:   01/14/23 0313 01/14/23 0852 01/14/23 0853 01/14/23 0857  BP: 131/77 (!) 168/64 121/72 128/67  Pulse: 81 74 72 70  Resp: 18 16  16   Temp: 97.6 F (36.4 C) 98.1 F (36.7 C)  98 F (36.7 C)  TempSrc: Oral     SpO2: 97% 97%  90%  Weight:      Height:      Physical Exam Constitutional:      Appearance: Normal appearance.  HENT:     Head: Normocephalic.     Nose: Nose normal.  Eyes:     Extraocular Movements: Extraocular movements intact.     Pupils: Pupils are equal, round, and reactive to light.  Cardiovascular:     Rate and Rhythm: Normal rate and regular rhythm.     Pulses: Normal pulses.  Pulmonary:     Effort: Pulmonary effort is normal.  Abdominal:     Palpations: Abdomen is soft.  Musculoskeletal:        General: Normal range of motion.  Skin:    General: Skin is warm.  Neurological:     General: No focal deficit present.     Mental Status: She is alert and oriented to person, place, and time.  Psychiatric:        Mood and Affect: Mood normal.     Data Reviewed:  Results are pending, will review when available.  Family Communication: None by bedside  Disposition: Status is: Inpatient Remains inpatient appropriate because: Urosepsis and bacteremia  Planned Discharge Destination: Home    Time spent: 32 minutes  Author: Kirstie Peri, MD 01/14/2023 12:15 PM  For on call review [email or web address].

## 2023-01-14 NOTE — Plan of Care (Signed)

## 2023-01-15 DIAGNOSIS — R400 Somnolence: Secondary | ICD-10-CM | POA: Diagnosis not present

## 2023-01-15 DIAGNOSIS — N390 Urinary tract infection, site not specified: Secondary | ICD-10-CM | POA: Diagnosis not present

## 2023-01-15 DIAGNOSIS — A419 Sepsis, unspecified organism: Secondary | ICD-10-CM | POA: Diagnosis not present

## 2023-01-15 LAB — URINE CULTURE: Culture: >=100000 — AB

## 2023-01-15 LAB — CULTURE, BLOOD (ROUTINE X 2): Special Requests: ADEQUATE

## 2023-01-15 LAB — PROTIME-INR
INR: 1.6 — ABNORMAL HIGH (ref 0.8–1.2)
Prothrombin Time: 18.5 seconds — ABNORMAL HIGH (ref 11.4–15.2)

## 2023-01-15 MED ORDER — WARFARIN SODIUM 6 MG PO TABS
9.0000 mg | ORAL_TABLET | Freq: Once | ORAL | Status: AC
  Administered 2023-01-15: 9 mg via ORAL
  Filled 2023-01-15: qty 1

## 2023-01-15 NOTE — Progress Notes (Signed)
ANTICOAGULATION CONSULT NOTE  Pharmacy Consult for Warfarin  Indication: atrial fibrillation  Patient Measurements: Height: 5\' 4"  (162.6 cm) Weight: 98 kg (216 lb 0.8 oz) IBW/kg (Calculated) : 54.7  Labs: Recent Labs    01/12/23 1630 01/13/23 0042 01/13/23 0435 01/13/23 1134 01/14/23 0427  HGB 13.6  --  12.7  --  11.4*  HCT 40.3  --  37.1  --  33.4*  PLT 185  --  161  --  144*  LABPROT  --  21.3*  --   --  20.6*  INR  --  1.9*  --   --  1.8*  HEPARINUNFRC  --   --   --  0.40  --   CREATININE 0.65  --  0.63  --  0.66  CKTOTAL 54  --   --   --   --     Estimated Creatinine Clearance: 70.1 mL/min (by C-G formula based on SCr of 0.66 mg/dL).  Medical History: Past Medical History:  Diagnosis Date   A-fib (HCC)    Anemia    B12 deficiency    Breast cancer (HCC) 03/31/2013   Right - chemo- mastectomy   Breast cancer (HCC) 1991   Rt.- radiation   Complication of anesthesia 01/2009   Recent years with general anesthesia had itching following surgery   DDD (degenerative disc disease), cervical    Depression    Diverticulitis    Dyspnea    Edema of both legs    GERD (gastroesophageal reflux disease)    Gout    H/O mastectomy 2014   per patient   H/O: cesarean section 1987   per patient report   H/O: hysterectomy 1996   per patient   Hydradenitis    Hypertension    Hypothyroidism    Neuropathy    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HCC)    Parkinson's disease    Personal history of chemotherapy    Personal history of radiation therapy    Ruptured cervical disc 2002   per patient    Sleep apnea    cpap machine   Super obese    Tachycardia     Assessment: Pharmacy consulted to dose heparin and warfarin for Afib in this 72 year old female admitted with AMS, sepsis from UTI. Pt was on warfarin 3 mg PO Q Mon and Fri and warfarin 6 mg PO Q Tue,Wed,Thur,Sat,Sun  PTA,  last dose on 4/25 @ 1100. INR on 4/26 = 1.9 (subtherapeutic).   Date INR Plan  4/25 N/A 6 mg (PTA)  4/25  N/A Dose missed  4/26 1.9 3 mg  4/27 1.8 6 mg  4/28 1.6 9 mg   DDI: Ceftriaxone  Goal of Therapy:  INR 2-3   Plan:  --INR subtherapeutic. Patient missed warfarin dose 4/25. Will give warfarin 9 mg (1.5 x home dose) tonight --After today, continue on home warfarin regimen as ordered. May be more sensitive to warfarin while on antibiotics --Daily INR per protocol --CBC per protocol  Tressie Ellis 01/15/2023 7:13 AM

## 2023-01-15 NOTE — Progress Notes (Signed)
.. Progress Note   Patient: Suzanne Dixon ZOX:[id number] DOB: 09/09/1951 DOA: 01/12/2023     3 DOS: the patient was seen and examined on 01/15/2023   Brief hospital course:  72 y.o. female brought by EMS for somnolence from home.  Patient has a history of recent UTI treatment and reported to have foul and strong odor to her urine.  Patient has past medical history of paroxysmal atrial fibrillation, hypertension, prior CVA, obesity.  On my exam patient is sleepy arousable in pain states that she has pain in her bottom.  At baseline patient states that she can walk she has somebody that comes to her home 3 days a week to help her.  But patient lives by herself I suspect that patient needs higher level of care and may need short-term rehab or skilled skilled nursing facility.   4/26 : Patient back to baseline.  Symptoms attributed to urosepsis.  Patient bacteremic with E. coli and Enterobacterales.  Sensitive to ceftriaxone.  Ceftriaxone continued.  4/27 : Patient continues to stay asymptomatic.  IV antibiotics continued.  AMS is completely resolved.  Repeat blood cultures with no growth.   4/28 : Patient back to baseline, AMS resolved.  Patient pending discharge based on sensitivities for antibiotic course.  Patient will be discharged on total of 4 days of antibiotics after the sensitivities comes back.   Assessment and Plan: AMS (altered mental status) -was attributed to UTI which resolved in the hospital course. Altered mental status somnolent secondary to acuity of condition dehydration decreased p.o. intake and sepsis and urinary tract infection. Aspiration precautions. Fall precautions. Swallow evaluation prior to anything p.o. Neurochecks every 4 hours x 2.  Sepsis secondary to UTI [facility]) Patient meets sepsis criteria vitals white count source of infection. Will follow culture sensitivity. Patient to continue Rocephin started in the emergency room.   Hypertension Vitals:   01/12/23  1626 01/12/23 1830 01/12/23 1900 01/12/23 1939  BP: (!) 152/86 (!) 148/90 (!) 151/86 (!) 149/85   01/12/23 2245 01/12/23 2300 01/12/23 2330 01/13/23 0000  BP: (!) 145/82 (!) 145/84 132/80 135/79  Continue patient's metoprolol and as needed hydralazine.    AF (paroxysmal atrial fibrillation) [facility]) Pharmacy consult for Coumadin. Will continue patient on metoprolol.      Subjective: Patient seen and examined this morning.  Vital labs and imaging reviewed.  Patient with no active complaint.  Physical Exam: Vitals:   01/14/23 2331 01/15/23 0401 01/15/23 0740 01/15/23 1135  BP: 127/79 119/72 129/77 136/80  Pulse: 66 67 67 73  Resp: 20 18 19 18   Temp: 97.8 F (36.6 C) 97.8 F (36.6 C) 97.7 F (36.5 C) 98.3 F (36.8 C)  TempSrc: Oral Oral Oral Oral  SpO2: 100% 98% 100% 99%  Weight:      Height:      Physical Exam Constitutional:      Appearance: Normal appearance.  HENT:     Head: Normocephalic.     Nose: Nose normal.  Eyes:     Extraocular Movements: Extraocular movements intact.     Pupils: Pupils are equal, round, and reactive to light.  Cardiovascular:     Rate and Rhythm: Normal rate and regular rhythm.     Pulses: Normal pulses.  Pulmonary:     Effort: Pulmonary effort is normal.  Abdominal:     Palpations: Abdomen is soft.  Musculoskeletal:        General: Normal range of motion.  Skin:    General: Skin is warm.  Neurological:     General: No focal deficit present.     Mental Status: She is alert and oriented to person, place, and time.  Psychiatric:        Mood and Affect: Mood normal.     Data Reviewed:  Results are pending, will review when available.  Family Communication: None by bedside  Disposition: Status is: Inpatient Remains inpatient appropriate because: Urosepsis and bacteremia  Planned Discharge Destination: Home    Time spent: 32 minutes  Author: Kirstie Peri, MD 01/15/2023 12:04 PM  For on call review [email or web address].

## 2023-01-16 DIAGNOSIS — R400 Somnolence: Secondary | ICD-10-CM | POA: Diagnosis not present

## 2023-01-16 DIAGNOSIS — I480 Paroxysmal atrial fibrillation: Secondary | ICD-10-CM | POA: Diagnosis not present

## 2023-01-16 DIAGNOSIS — I10 Essential (primary) hypertension: Secondary | ICD-10-CM

## 2023-01-16 DIAGNOSIS — A419 Sepsis, unspecified organism: Secondary | ICD-10-CM | POA: Diagnosis not present

## 2023-01-16 LAB — PROTIME-INR
INR: 1.9 — ABNORMAL HIGH (ref 0.8–1.2)
Prothrombin Time: 21.7 seconds — ABNORMAL HIGH (ref 11.4–15.2)

## 2023-01-16 LAB — CULTURE, BLOOD (ROUTINE X 2)

## 2023-01-16 MED ORDER — CEFDINIR 300 MG PO CAPS: 300.0000 mg | ORAL_CAPSULE | Freq: Two times a day (BID) | ORAL | 0 refills | Status: DC

## 2023-01-16 MED ORDER — CEFDINIR 300 MG PO CAPS: 300.0000 mg | ORAL_CAPSULE | Freq: Two times a day (BID) | ORAL | 0 refills | Status: AC

## 2023-01-16 NOTE — TOC Transition Note (Addendum)
Transition of Care [facility]) - CM/SW Discharge Note   Patient Details  Name: Suzanne Dixon MRN: [id number] Date of Birth: 1951-07-08  Transition of Care [facility]) CM/SW Contact:  Truddie Hidden, RN Phone Number: 01/16/2023, 10:48 AM   Clinical Narrative:    Spoke with patient regarding therapy recommendation for PT. She is agreeable and would like [facility]. She stated she has all other needed DME and has being waiting to get approved for an electric wheel chair.   Referral sent to [facility] per patient request.  Per Adelina Mings patient referral declined  Referral sent and accepted by [place] from [place].  Patient advised [facility] will be in contact with her directly for [facility].   TOC signing off.          Patient Goals and CMS Choice      Discharge Placement                         Discharge Plan and Services Additional resources added to the After Visit Summary for                                       Social Determinants of Health (SDOH) Interventions SDOH Screenings   Food Insecurity: Food Insecurity Present (06/19/2022)  Housing: Low Risk  (06/19/2022)  Transportation Needs: Unmet Transportation Needs (06/19/2022)  Utilities: Not At Risk (06/19/2022)  Tobacco Use: Low Risk  (01/13/2023)     Readmission Risk Interventions    06/21/2021    4:12 PM  Readmission Risk Prevention Plan  Transportation Screening Complete  PCP or Specialist Appt within 3-5 Days Complete  HRI or Home Care Consult Complete  Social Work Consult for Recovery Care Planning/Counseling Complete  Palliative Care Screening Not Applicable  Medication Review [employer]) Complete

## 2023-01-16 NOTE — Plan of Care (Signed)

## 2023-01-16 NOTE — Progress Notes (Signed)
Mobility Specialist - Progress Note   01/16/23 1030  Orthostatic Lying   BP- Lying 117/87  Pulse- Lying 73  Orthostatic Sitting  BP- Sitting 123/81  Pulse- Sitting 89  Orthostatic Standing at 0 minutes  BP- Standing at 0 minutes 118/74  Pulse- Standing at 0 minutes 94  Orthostatic Standing at 3 minutes  BP- Standing at 3 minutes (!) 110/91  Pulse- Standing at 3 minutes 101  Mobility  Activity Stood at bedside;Dangled on edge of bed;Transferred from chair to bed  Level of Assistance Standby assist, set-up cues, supervision of patient - no hands on  Assistive Device Front wheel walker  Distance Ambulated (ft) 3 ft  Activity Response Tolerated well  $Mobility charge 1 Mobility     Pt sitting in recliner upon arrival, utilizing RA. Orthostatics taken and recorded above. No complaints of dizziness. STS modI + RW. Shakiness in UE, pt reports she has mild Parkinsons. Voiced soreness in LLE post-session. Pt returned to bed with needs in reach. RN notified.    Filiberto Pinks Mobility Specialist 01/16/23, 10:33 AM

## 2023-01-16 NOTE — Evaluation (Signed)
Physical Therapy Evaluation Patient Details Name: Suzanne Dixon MRN: [id number] DOB: 1950-10-05 Today's Date: 01/16/2023  History of Present Illness  72 y.o. female brought by EMS for somnolence from home, noted for DKA. Patient has a history of recent UTI treatment and reported to have foul and strong odor to her urine.  Patient has past medical history of paroxysmal atrial fibrillation, hypertension, prior CVA, obesity.   Clinical Impression  Pt alert, agreeable to PT, reported at baseline she is ambulatory with her RW but does also have a wheelchair she is comfortable using. Lives alone with an aide 3 times a week that assists with IADLs and bathing, grocery shopping, etc.   She was able to perform bed mobility with supervision, and sit <> stand with RW and supervision as well, several times during session. She ambulated ~42ft twice, no LOB noted but pt did complain of feeling "whoozy" or lightheaded with each bout, RN notified. Overall the patient demonstrated near return to baseline level of functioning but would benefit from further skilled PT to maximize safety, function, and conditioning.        Recommendations for follow up therapy are one component of a multi-disciplinary discharge planning process, led by the attending physician.  Recommendations may be updated based on patient status, additional functional criteria and insurance authorization.  Follow Up Recommendations       Assistance Recommended at Discharge Intermittent Supervision/Assistance  Patient can return home with the following  Assistance with cooking/housework;Assist for transportation;Help with stairs or ramp for entrance;A little help with bathing/dressing/bathroom    Equipment Recommendations None recommended by PT  Recommendations for Other Services       Functional Status Assessment Patient has had a recent decline in their functional status and demonstrates the ability to make significant improvements in  function in a reasonable and predictable amount of time.     Precautions / Restrictions Precautions Precautions: Fall Restrictions Weight Bearing Restrictions: No      Mobility  Bed Mobility Overal bed mobility: Modified Independent Bed Mobility: Supine to Sit     Supine to sit: Supervision     General bed mobility comments: use of bed rails    Transfers Overall transfer level: Needs assistance Equipment used: Rolling walker (2 wheels) Transfers: Sit to/from Stand Sit to Stand: Supervision                Ambulation/Gait Ambulation/Gait assistance: Supervision Gait Distance (Feet): 25 Feet (twice) Assistive device: Rolling walker (2 wheels)         General Gait Details: slow but steady  Stairs            Wheelchair Mobility    Modified Rankin (Stroke Patients Only)       Balance Overall balance assessment: Needs assistance Sitting-balance support: Feet supported Sitting balance-Leahy Scale: Good     Standing balance support: Bilateral upper extremity supported Standing balance-Leahy Scale: Fair                               Pertinent Vitals/Pain Pain Assessment Pain Assessment: No/denies pain    Home Living Family/patient expects to be discharged to:: Private residence Living Arrangements: Alone Available Help at Discharge: Family;Available PRN/intermittently;Personal care attendant (aide available mon - wed -fri) Type of Home: House Home Access: Ramped entrance       Home Layout: One level Home Equipment: Shower seat - built in;Grab bars - toilet;Grab bars - tub/shower;BSC/3in1;Rollator (4 wheels);Rolling Environmental consultant (  2 wheels);Cane - quad;Wheelchair - manual;Hospital bed      Prior Function Prior Level of Function : Needs assist             Mobility Comments: ambulates with RW or WC, HHPT stopped in Jan ADLs Comments: Pt. has home health 4 hours x 3 days a week to do laundry, grocery shopping, cooking, cleaning,  helps with dressing, bathing.  She uses the [facility] herself. Pt. heats up meals in the microwave on the days the the aides are off.     Hand Dominance        Extremity/Trunk Assessment   Upper Extremity Assessment Upper Extremity Assessment: Generalized weakness    Lower Extremity Assessment Lower Extremity Assessment: Generalized weakness    Cervical / Trunk Assessment Cervical / Trunk Assessment: Normal  Communication   Communication: No difficulties  Cognition Arousal/Alertness: Awake/alert Behavior During Therapy: WFL for tasks assessed/performed Overall Cognitive Status: Within Functional Limits for tasks assessed                                          General Comments      Exercises     Assessment/Plan    PT Assessment Patient needs continued PT services  PT Problem List Decreased activity tolerance       PT Treatment Interventions DME instruction;Therapeutic activities;Gait training;Therapeutic exercise;Patient/family education;Stair training;Balance training;Functional mobility training;Neuromuscular re-education    PT Goals (Current goals can be found in the Care Plan section)  Acute Rehab PT Goals Patient Stated Goal: to go home PT Goal Formulation: With patient Time For Goal Achievement: 01/30/23 Potential to Achieve Goals: Good    Frequency Min 2X/week     Co-evaluation               AM-PAC PT "6 Clicks" Mobility  Outcome Measure Help needed turning from your back to your side while in a flat bed without using bedrails?: None Help needed moving from lying on your back to sitting on the side of a flat bed without using bedrails?: None Help needed moving to and from a bed to a chair (including a wheelchair)?: None Help needed standing up from a chair using your arms (e.g., wheelchair or bedside chair)?: None Help needed to walk in hospital room?: A Little Help needed climbing 3-5 steps with a railing? : A Little 6 Click  Score: 22    End of Session Equipment Utilized During Treatment: Gait belt Activity Tolerance: Patient tolerated treatment well Patient left: in chair;with call bell/phone within reach Nurse Communication: Mobility status (need for orthostatic vitals) PT Visit Diagnosis: Other abnormalities of gait and mobility (R26.89)    Time: 1610-9604 PT Time Calculation (min) (ACUTE ONLY): 25 min   Charges:   PT Evaluation $PT Eval Low Complexity: 1 Low PT Treatments $Therapeutic Activity: 8-22 mins      Olga Coaster PT, DPT 11:19 AM,01/16/23

## 2023-01-16 NOTE — Discharge Summary (Signed)
Physician Discharge Summary  Suzanne Dixon QIO:[id number] DOB: September 11, 1951 DOA: 01/12/2023  PCP: Lynnea Ferrier, MD  Admit date: 01/12/2023 Discharge date: 01/16/2023  Admitted From: Home  Discharge disposition: Home with home health  Recommendations for Outpatient Follow-Up:   Follow up with your primary care provider in one week.  Check CBC, BMP, magnesium in the next visit   Discharge Diagnosis:   Active Problems:   AMS (altered mental status)   Sepsis secondary to UTI (HCC)   AF (paroxysmal atrial fibrillation) (HCC)   Hypertension   Discharge Condition: Improved.  Diet recommendation: Low sodium, heart healthy.    Wound care: None.  Code status: Full.   History of Present Illness:   72 y.o. female with past medical history of breast cancer, depression, paroxysmal atrial fibrillation, history of previous CVA, GERD, gout, hypothyroidism, hypertension, history of sleep apnea presented to hospital with somnolence at home.  Patient does have recent history of UTI.  Patient by herself at home.  Patient was very somnolent and could not arouse on admission.  Initial labs showed hyponatremia with sodium of 133, lactate at 1.2 with leukocytosis at 16 K.  CT head scan was negative for acute findings.  Urinalysis was abnormal.  Patient was then admitted to the hospital for acute metabolic encephalopathy secondary to sepsis from UTI.    Hospital Course:   Following conditions were addressed during hospitalization as listed below,  Acute metabolic encephalopathy Secondary to UTI and volume depletion.  Improved.  Sepsis secondary to E. coli/Klebsiella UTI  with bacteremia. Sepsis secondary to UTI.  Patient received IV Rocephin hospitalization.  Blood cultures E. coli bacteremia from cultures drawn 01/12/2023.  Repeat blood cultures negative.  Urine culture from 01/12/2023 however showed E. coli and Klebsiella sensitive to cephalosporin.  Patient received IV Rocephin during  hospitalization and will be changed to [facility] on discharge for 5 more days on discharge to complete 10-day course of antibiotic.  Hypertension Continue metoprolol.  Blood pressure stable at this time.  AF (paroxysmal atrial fibrillation) (HCC) Continue metoprolol and Coumadin.  INR from today at 1.9.  History of Parkinson's disease.  On carbidopa levodopa.  Physical therapy has recommended home health PT on discharge.  Hypothyroidism.  Continue Synthroid.  Disposition.  At this time, patient is stable for disposition home with outpatient PT.  Recommend outpatient PCP follow-up.  Medical Consultants:   None.  Procedures:    None Subjective:   Today, patient seen and examined at bedside.  Feels at her baseline.  Was little dizzy when ambulated but did not have orthostatic hypotension.  Discharge Exam:   Vitals:   01/16/23 0436 01/16/23 0818  BP: (!) 142/84 136/81  Pulse: 60 63  Resp: 18 18  Temp: (!) 97.5 F (36.4 C) 98.4 F (36.9 C)  SpO2: 100% 98%   Vitals:   01/15/23 2136 01/15/23 2346 01/16/23 0436 01/16/23 0818  BP: 125/78 (!) 146/86 (!) 142/84 136/81  Pulse: 65 62 60 63  Resp: 20 18 18 18   Temp: 97.8 F (36.6 C) (!) 97.4 F (36.3 C) (!) 97.5 F (36.4 C) 98.4 F (36.9 C)  TempSrc:    Oral  SpO2: 98% 100% 100% 98%  Weight:      Height:       Body mass index is 37.09 kg/m.   General: Alert awake, not in obvious distress, obese built HENT: pupils equally reacting to light,  No scleral pallor or icterus noted. Oral mucosa is moist.  Chest:  Diminished breath sounds bilaterally. No crackles or wheezes.  CVS: S1 &S2 heard. No murmur.  Regular rate and rhythm. Abdomen: Soft, nontender, nondistended.  Bowel sounds are heard.   Extremities: No cyanosis, clubbing or edema.  Peripheral pulses are palpable. Psych: Alert, awake and oriented, normal mood CNS:  No cranial nerve deficits.  Power equal in all extremities.   Skin: Warm and dry.  No rashes  noted.  The results of significant diagnostics from this hospitalization (including imaging, microbiology, ancillary and laboratory) are listed below for reference.     Diagnostic Studies:   CT ABDOMEN PELVIS W CONTRAST  Result Date: 01/12/2023 CLINICAL DATA:  Abdominal pain, acute (Ped 0-17y) EXAM: CT ABDOMEN AND PELVIS WITH CONTRAST TECHNIQUE: Multidetector CT imaging of the abdomen and pelvis was performed using the standard protocol following bolus administration of intravenous contrast. RADIATION DOSE REDUCTION: This exam was performed according to the departmental dose-optimization program which includes automated exposure control, adjustment of the mA and/or kV according to patient size and/or use of iterative reconstruction technique. CONTRAST:  OMNIPAQUE IOHEXOL 300 MG/ML  SOLN COMPARISON:  06/19/2021 FINDINGS: Lower chest: No acute abnormality Hepatobiliary: Gallbladder not visualized, presumed cholecystectomy. No focal hepatic abnormality. Pancreas: No focal abnormality or ductal dilatation. Spleen: No focal abnormality.  Normal size. Adrenals/Urinary Tract: Left midpole cyst measures 2.7 cm. Right lower pole cyst measures 4.5 cm. No follow-up imaging recommended. No hydronephrosis. Adrenal glands and urinary bladder unremarkable. Stomach/Bowel: Prior gastric bypass. No complicating feature. Stomach, large and small bowel grossly unremarkable. Vascular/Lymphatic: No evidence of aneurysm or adenopathy. Reproductive: Prior hysterectomy.  No adnexal masses. Other: No free fluid or free air. Musculoskeletal: No acute bony abnormality. Degenerative changes in the lumbar spine. IMPRESSION: No acute findings in the abdomen or pelvis. Electronically Signed   By: Charlett Nose M.D.   On: 01/12/2023 20:43   CT HEAD WO CONTRAST ( )  Result Date: 01/12/2023 CLINICAL DATA:  Urinary tract infection, altered level of consciousness, lower extremity swelling EXAM: CT HEAD WITHOUT CONTRAST TECHNIQUE:  Contiguous axial images were obtained from the base of the skull through the vertex without intravenous contrast. RADIATION DOSE REDUCTION: This exam was performed according to the departmental dose-optimization program which includes automated exposure control, adjustment of the mA and/or kV according to patient size and/or use of iterative reconstruction technique. COMPARISON:  06/19/2022 FINDINGS: Brain: No acute infarct or hemorrhage. Lateral ventricles and midline structures are unremarkable. No acute extra-axial fluid collections. No mass effect. Vascular: No hyperdense vessel or unexpected calcification. Skull: Normal. Negative for fracture or focal lesion. Sinuses/Orbits: No acute finding. Other: None. IMPRESSION: 1. Stable head CT, no acute intracranial process. Electronically Signed   By: Sharlet Salina M.D.   On: 01/12/2023 19:03   DG Chest Port 1 View  Result Date: 01/12/2023 CLINICAL DATA:  Sepsis. EXAM: PORTABLE CHEST 1 VIEW COMPARISON:  June 19, 2022. FINDINGS: Stable cardiomediastinal silhouette. Both lungs are clear. Status post right total shoulder arthroplasty. IMPRESSION: No active disease. Electronically Signed   By: Lupita Raider M.D.   On: 01/12/2023 17:46     Labs:   Basic Metabolic Panel: Recent Labs  Lab 01/12/23 1630 01/13/23 0435 01/14/23 0427  NA 133* 134* 133*  K 4.1 3.1* 4.1  CL 97* 100 100  CO2 29 26 26   GLUCOSE 121* 117* 91  BUN 16 14 15   CREATININE 0.65 0.63 0.66  CALCIUM 8.3* 8.0* 7.8*  MG 1.6*  --   --    GFR Estimated Creatinine  Clearance: 70.1 mL/min (by C-G formula based on SCr of 0.66 mg/dL). Liver Function Tests: Recent Labs  Lab 01/13/23 0435  AST 28  ALT 13  ALKPHOS 87  BILITOT 1.7*  PROT 6.5  ALBUMIN 3.0*   No results for input(s): "LIPASE", "AMYLASE" in the last 168 hours. No results for input(s): "AMMONIA" in the last 168 hours. Coagulation profile Recent Labs  Lab 01/13/23 0042 01/14/23 0427 01/15/23 0731 01/16/23 0504   INR 1.9* 1.8* 1.6* 1.9*    CBC: Recent Labs  Lab 01/12/23 1630 01/13/23 0435 01/14/23 0427  WBC 16.0* 23.8* 11.7*  HGB 13.6 12.7 11.4*  HCT 40.3 37.1 33.4*  MCV 92.0 91.2 91.5  PLT 185 161 144*   Cardiac Enzymes: Recent Labs  Lab 01/12/23 1630  CKTOTAL 54   BNP: Invalid input(s): "POCBNP" CBG: No results for input(s): "GLUCAP" in the last 168 hours. D-Dimer No results for input(s): "DDIMER" in the last 72 hours. Hgb A1c No results for input(s): "HGBA1C" in the last 72 hours. Lipid Profile No results for input(s): "CHOL", "HDL", "LDLCALC", "TRIG", "CHOLHDL", "LDLDIRECT" in the last 72 hours. Thyroid function studies No results for input(s): "TSH", "T4TOTAL", "T3FREE", "THYROIDAB" in the last 72 hours.  Invalid input(s): "FREET3" Anemia work up No results for input(s): "VITAMINB12", "FOLATE", "FERRITIN", "TIBC", "IRON", "RETICCTPCT" in the last 72 hours. Microbiology Recent Results (from the past 240 hour(s))  Blood Culture (routine x 2)     Status: None (Preliminary result)   Collection Time: 01/12/23  5:37 PM   Specimen: BLOOD  Result Value Ref Range Status   Specimen Description BLOOD BLOOD RIGHT HAND  Final   Special Requests BOTTLES DRAWN AEROBIC ONLY BCHV  Final   Culture   Final    NO GROWTH 4 DAYS Performed at [facility], [address] [place], [place] [address]    Report Status PENDING  Incomplete  Blood Culture (routine x 2)     Status: Abnormal   Collection Time: 01/12/23  5:37 PM   Specimen: BLOOD LEFT ARM  Result Value Ref Range Status   Specimen Description BLOOD LEFT ARM  Final   Special Requests   Final    BOTTLES DRAWN AEROBIC ONLY Blood Culture adequate volume   Culture  Setup Time   Final    GRAM NEGATIVE RODS AEROBIC BOTTLE ONLY CRITICAL RESULT CALLED TO, READ BACK BY AND VERIFIED WITH: LISA KLUTTZ ON 01/13/23 AT 6045 QSD Performed at [facility], [address] [place], [place] [address]    Culture ESCHERICHIA  COLI (A)  Final   Report Status 01/15/2023 FINAL  Final   Organism ID, Bacteria ESCHERICHIA COLI  Final      Susceptibility   Escherichia coli - MIC*    AMPICILLIN <=2 SENSITIVE Sensitive     CEFEPIME <=0.12 SENSITIVE Sensitive     CEFTAZIDIME <=1 SENSITIVE Sensitive     CEFTRIAXONE <=0.25 SENSITIVE Sensitive     CIPROFLOXACIN <=0.25 SENSITIVE Sensitive     GENTAMICIN <=1 SENSITIVE Sensitive     IMIPENEM <=0.25 SENSITIVE Sensitive     TRIMETH/SULFA <=20 SENSITIVE Sensitive     AMPICILLIN/SULBACTAM <=2 SENSITIVE Sensitive     PIP/TAZO <=4 SENSITIVE Sensitive     * ESCHERICHIA COLI  Urine Culture     Status: Abnormal   Collection Time: 01/12/23  5:37 PM   Specimen: Urine, Random  Result Value Ref Range Status   Specimen Description   Final    URINE, RANDOM Performed at [facility], [address] [place], [place] [address]    Special Requests   Final    NONE Reflexed from [id number] Performed at [facility], [address] [place], [place] [address]    Culture (A)  Final    >=100,000 COLONIES/mL ESCHERICHIA COLI 50,000 COLONIES/mL KLEBSIELLA PNEUMONIAE    Report Status 01/15/2023 FINAL  Final   Organism ID, Bacteria ESCHERICHIA COLI (A)  Final   Organism ID, Bacteria KLEBSIELLA PNEUMONIAE (A)  Final      Susceptibility   Escherichia coli - MIC*    AMPICILLIN <=2 SENSITIVE Sensitive     CEFAZOLIN <=4 SENSITIVE Sensitive     CEFEPIME <=0.12 SENSITIVE Sensitive     CEFTRIAXONE <=0.25 SENSITIVE Sensitive     CIPROFLOXACIN <=0.25 SENSITIVE Sensitive     GENTAMICIN <=1 SENSITIVE Sensitive     IMIPENEM <=0.25 SENSITIVE Sensitive     NITROFURANTOIN <=16 SENSITIVE Sensitive     TRIMETH/SULFA <=20 SENSITIVE Sensitive     AMPICILLIN/SULBACTAM <=2 SENSITIVE Sensitive     PIP/TAZO <=4 SENSITIVE Sensitive     * >=100,000 COLONIES/mL ESCHERICHIA COLI   Klebsiella pneumoniae - MIC*    AMPICILLIN >=32 RESISTANT Resistant     CEFAZOLIN <=4 SENSITIVE Sensitive      CEFEPIME <=0.12 SENSITIVE Sensitive     CEFTRIAXONE <=0.25 SENSITIVE Sensitive     CIPROFLOXACIN <=0.25 SENSITIVE Sensitive     GENTAMICIN <=1 SENSITIVE Sensitive     IMIPENEM <=0.25 SENSITIVE Sensitive     NITROFURANTOIN 64 INTERMEDIATE Intermediate     TRIMETH/SULFA <=20 SENSITIVE Sensitive     AMPICILLIN/SULBACTAM >=32 RESISTANT Resistant     PIP/TAZO 8 SENSITIVE Sensitive     * 50,000 COLONIES/mL KLEBSIELLA PNEUMONIAE  Blood Culture ID Panel (Reflexed)     Status: Abnormal   Collection Time: 01/12/23  5:37 PM  Result Value Ref Range Status   Enterococcus faecalis NOT DETECTED NOT DETECTED Final   Enterococcus Faecium NOT DETECTED NOT DETECTED Final   Listeria monocytogenes NOT DETECTED NOT DETECTED Final   Staphylococcus species NOT DETECTED NOT DETECTED Final   Staphylococcus aureus (BCID) NOT DETECTED NOT DETECTED Final   Staphylococcus epidermidis NOT DETECTED NOT DETECTED Final   Staphylococcus lugdunensis NOT DETECTED NOT DETECTED Final   Streptococcus species NOT DETECTED NOT DETECTED Final   Streptococcus agalactiae NOT DETECTED NOT DETECTED Final   Streptococcus pneumoniae NOT DETECTED NOT DETECTED Final   Streptococcus pyogenes NOT DETECTED NOT DETECTED Final   A.calcoaceticus-baumannii NOT DETECTED NOT DETECTED Final   Bacteroides fragilis NOT DETECTED NOT DETECTED Final   Enterobacterales DETECTED (A) NOT DETECTED Final    Comment: Enterobacterales represent a large order of gram negative bacteria, not a single organism. CRITICAL RESULT CALLED TO, READ BACK BY AND VERIFIED WITH: LISA KLUTTZ ON 01/13/23 AT 1478 QSD    Enterobacter cloacae complex NOT DETECTED NOT DETECTED Final   Escherichia coli DETECTED (A) NOT DETECTED Final    Comment: CRITICAL RESULT CALLED TO, READ BACK BY AND VERIFIED WITH: LISA KLUTTZ ON 01/13/23 AT 0712 QSD    Klebsiella aerogenes NOT DETECTED NOT DETECTED Final   Klebsiella oxytoca NOT DETECTED NOT DETECTED Final   Klebsiella pneumoniae NOT  DETECTED NOT DETECTED Final   Proteus species NOT DETECTED NOT DETECTED Final   Salmonella species NOT DETECTED NOT DETECTED Final   Serratia marcescens NOT DETECTED NOT DETECTED Final   Haemophilus influenzae NOT DETECTED NOT DETECTED Final   Neisseria meningitidis NOT DETECTED NOT DETECTED Final   Pseudomonas aeruginosa NOT DETECTED NOT  DETECTED Final   Stenotrophomonas maltophilia NOT DETECTED NOT DETECTED Final   Candida albicans NOT DETECTED NOT DETECTED Final   Candida auris NOT DETECTED NOT DETECTED Final   Candida glabrata NOT DETECTED NOT DETECTED Final   Candida krusei NOT DETECTED NOT DETECTED Final   Candida parapsilosis NOT DETECTED NOT DETECTED Final   Candida tropicalis NOT DETECTED NOT DETECTED Final   Cryptococcus neoformans/gattii NOT DETECTED NOT DETECTED Final   CTX-M ESBL NOT DETECTED NOT DETECTED Final   Carbapenem resistance IMP NOT DETECTED NOT DETECTED Final   Carbapenem resistance KPC NOT DETECTED NOT DETECTED Final   Carbapenem resistance NDM NOT DETECTED NOT DETECTED Final   Carbapenem resist OXA 48 LIKE NOT DETECTED NOT DETECTED Final   Carbapenem resistance VIM NOT DETECTED NOT DETECTED Final    Comment: Performed at [facility], [address] [place], [place] [address]  Culture, blood (Routine X 2) w Reflex to ID Panel     Status: None (Preliminary result)   Collection Time: 01/14/23  4:27 AM   Specimen: BLOOD  Result Value Ref Range Status   Specimen Description BLOOD BLOOD LEFT ARM  Final   Special Requests   Final    BOTTLES DRAWN AEROBIC AND ANAEROBIC Blood Culture results may not be optimal due to an excessive volume of blood received in culture bottles   Culture   Final    NO GROWTH 2 DAYS Performed at [facility], [address] [place], [place] [address]    Report Status PENDING  Incomplete  Culture, blood (Routine X 2) w Reflex to ID Panel     Status: None (Preliminary result)   Collection Time: 01/14/23  5:52 AM    Specimen: BLOOD  Result Value Ref Range Status   Specimen Description BLOOD BLOOD LEFT HAND  Final   Special Requests   Final    BOTTLES DRAWN AEROBIC AND ANAEROBIC Blood Culture adequate volume   Culture   Final    NO GROWTH 2 DAYS Performed at [facility], [address], [place], [place] [address]    Report Status PENDING  Incomplete     Discharge Instructions:   Discharge Instructions     Call MD for:  persistant nausea and vomiting   Complete by: As directed    Call MD for:  temperature >100.4   Complete by: As directed    Diet general   Complete by: As directed    Discharge instructions   Complete by: As directed    Complete the course of antibiotic.  Follow-up with your primary care provider in 1 week.  Check blood work at that time.  Seek medical attention for worsening symptoms.   Increase activity slowly   Complete by: As directed    No wound care   Complete by: As directed       Allergies as of 01/16/2023       Reactions   Advair Diskus [fluticasone-salmeterol] Other (See Comments)   Joint pain    Alendronate    Muscle pain   Singulair [montelukast] Other (See Comments)   "muscle pain"   Sulfa Antibiotics Hives   Venlafaxine Other (See Comments)   "altered mental status/tremors"        Medication List     STOP taking these medications    nitrofurantoin (macrocrystal-monohydrate) 100 MG capsule Commonly known as: MACROBID       TAKE these medications    allopurinol 300 MG tablet Commonly known as: ZYLOPRIM Take 300  mg by mouth daily.   Calcium Carb-Cholecalciferol 600-400 MG-UNIT Tabs Take 1 tablet by mouth 2 (two) times daily with a meal.   carbidopa-levodopa 25-100 MG tablet Commonly known as: SINEMET IR Take 2 tablets by mouth 3 (three) times daily.   cefdinir 300 MG capsule Commonly known as: OMNICEF Take 1 capsule (300 mg total) by mouth 2 (two) times daily for 5 days.   cyanocobalamin 1000 MCG tablet Commonly  known as: VITAMIN B12 Take 1,000 mcg by mouth daily.   DULoxetine 60 MG capsule Commonly known as: CYMBALTA Take 60 mg by mouth 2 (two) times daily.   gabapentin 300 MG capsule Commonly known as: NEURONTIN Take 300 mg by mouth 4 (four) times daily.   levothyroxine 50 MCG tablet Commonly known as: SYNTHROID Take 50 mcg by mouth daily before breakfast.   Magnesium 400 MG Caps Take 400 mg by mouth daily.   meloxicam 15 MG tablet Commonly known as: MOBIC Take 15 mg by mouth daily.   metoprolol tartrate 100 MG tablet Commonly known as: LOPRESSOR Take 1 tablet (100 mg total) by mouth 2 (two) times daily. Reduced from 200 mg.   Multiple Vitamins tablet Take 0.5 tablets by mouth daily. Take 1/2 tablet daily in the afternoon   omeprazole 20 MG capsule Commonly known as: PRILOSEC Take 20 mg by mouth daily.   oxybutynin 5 MG tablet Commonly known as: DITROPAN Take 5 mg by mouth 2 (two) times daily.   PSORIASIS/ECZEMA RELIEF EX Apply 1 application topically daily as needed (eczema).   triamcinolone 0.025 % cream Commonly known as: KENALOG Apply 1 application topically 2 (two) times daily as needed. Home med.   warfarin 6 MG tablet Commonly known as: COUMADIN Take 1 tablet (6 mg total) by mouth daily at 4 PM. Reduced from 7 mg. What changed: additional instructions   warfarin 1 MG tablet Commonly known as: COUMADIN Take 1 mg by mouth daily. Take 7 mg by mouth daily (take with 6mg  tablet) in the afternoon. What changed: Another medication with the same name was changed. Make sure you understand how and when to take each.         Time coordinating discharge: 39 minutes  Signed:  Subrena Devereux  [facility] 01/16/2023, 1:00 PM

## 2023-01-16 NOTE — Progress Notes (Addendum)
ANTICOAGULATION CONSULT NOTE  Pharmacy Consult for Warfarin  Indication: atrial fibrillation  Patient Measurements: Height: 5\' 4"  (162.6 cm) Weight: 98 kg (216 lb 0.8 oz) IBW/kg (Calculated) : 54.7  Labs: Recent Labs    01/13/23 1134 01/14/23 0427 01/15/23 0731 01/16/23 0504  HGB  --  11.4*  --   --   HCT  --  33.4*  --   --   PLT  --  144*  --   --   LABPROT  --  20.6* 18.5* 21.7*  INR  --  1.8* 1.6* 1.9*  HEPARINUNFRC 0.40  --   --   --   CREATININE  --  0.66  --   --     Estimated Creatinine Clearance: 70.1 mL/min (by C-G formula based on SCr of 0.66 mg/dL).  Medical History: Past Medical History:  Diagnosis Date   A-fib (HCC)    Anemia    B12 deficiency    Breast cancer (HCC) 03/31/2013   Right - chemo- mastectomy   Breast cancer (HCC) 1991   Rt.- radiation   Complication of anesthesia 01/2009   Recent years with general anesthesia had itching following surgery   DDD (degenerative disc disease), cervical    Depression    Diverticulitis    Dyspnea    Edema of both legs    GERD (gastroesophageal reflux disease)    Gout    H/O mastectomy 2014   per patient   H/O: cesarean section 1987   per patient report   H/O: hysterectomy 1996   per patient   Hydradenitis    Hypertension    Hypothyroidism    Neuropathy    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HCC)    Parkinson's disease    Personal history of chemotherapy    Personal history of radiation therapy    Ruptured cervical disc 2002   per patient    Sleep apnea    cpap machine   Super obese    Tachycardia     Assessment: Pharmacy consulted to dose heparin and warfarin for Afib in this 72 year old female admitted with AMS, sepsis from UTI. Pt was on warfarin 3 mg PO Q Mon and Fri and warfarin 6 mg PO Q Tue,Wed,Thur,Sat,Sun  PTA,  last dose on 4/25 @ 1100. INR on 4/26 = 1.9 (subtherapeutic).   Date INR Plan  4/25 N/A 6 mg (PTA)  4/25 N/A Dose missed  4/26 1.9 3 mg  4/27 1.8 6 mg  4/28 1.6 9 mg  4/29 1.9 3 mg    DDI: Ceftriaxone  Goal of Therapy:  INR 2-3   Plan:  --INR slightly subtherapeutic but trending up. Patient missed warfarin dose 4/25 --Continue on home warfarin regimen as ordered, patient will receive warfarin 3 mg tonight. May be more sensitive to warfarin while on antibiotics --Daily INR per protocol --CBC per protocol  Tressie Ellis 01/16/2023 7:37 AM

## 2023-01-16 NOTE — Care Management Important Message (Signed)
Important Message  Patient Details  Name: Suzanne Dixon MRN: [id number] Date of Birth: May 22, 1951   Medicare Important Message Given:  Yes     Johnell Comings 01/16/2023, 12:12 PM

## 2023-01-17 LAB — CULTURE, BLOOD (ROUTINE X 2)
Culture: NO GROWTH
Culture: NO GROWTH

## 2023-01-18 LAB — CULTURE, BLOOD (ROUTINE X 2)
Culture: NO GROWTH
Special Requests: ADEQUATE

## 2023-02-17 ENCOUNTER — Other Ambulatory Visit: Payer: Self-pay | Admitting: Internal Medicine

## 2023-02-17 DIAGNOSIS — R1310 Dysphagia, unspecified: Secondary | ICD-10-CM

## 2023-03-17 ENCOUNTER — Ambulatory Visit
Admission: RE | Admit: 2023-03-17 | Discharge: 2023-03-17 | Disposition: A | Discharge status: Home / Self Care | Payer: Medicare HMO | Source: Ambulatory Visit | Attending: Internal Medicine | Admitting: Internal Medicine

## 2023-03-17 DIAGNOSIS — R1310 Dysphagia, unspecified: Secondary | ICD-10-CM | POA: Insufficient documentation

## 2023-03-17 NOTE — Addendum Note (Signed)
Encounter addended by: Reuel Derby, CCC-SLP on: 03/17/2023 2:46 PM  Actions taken: Flowsheet accepted, Clinical Note Signed

## 2023-03-17 NOTE — Procedures (Addendum)
Modified Barium Swallow Study  Patient Details  Name: Suzanne Dixon MRN: [id number] Date of Birth: 05-24-1951  Today's Date: 03/17/2023  Modified Barium Swallow completed.  Full report located under Chart Review in the Imaging Section.  History of Present Illness Pt is a 72 year old female with chronic history of esophageal dysphagia. Barium Swallow on 09/26/2022 revealed patulous esophagus with significant esophageal dysmotility  2.  Small Zenker's diverticulum measuring 6 mm. Small, smooth, external impression on posterior esophagus adjacent to the anterior cervical fusion without obstruction. Pt also had follow up manometry at [facility] on 10/26/2022 with recommendation from GI to try peppermint or altoid prior to meals. More recently, pt recieved HHST d/t continued globus sensation especially with pills. Per pt report, ST was unable to help.   Clinical Impression Pt presents with adequate oropharyngeal abilities when consuming thin liquids via spoon and cup, puree and pill with thin liquids. Trials and consistencies were limited d/t pt distaste of barium. When consuming thin liquids, pt mild penetration that cleared with each swallow. The barium tablet  remained on her tongue as she reported that it was "too big to swallow." As in pt's barium swallow study, zenker's diverticulum/prominent cricopharyngeus was observed which did trap some liquid and food. Recommend that pt follow up with her GI. Pt in agreement.  Factors that may increase risk of adverse event in presence of aspiration Suzanne Dixon & Clearance Coots 2021):  Esophageal dysphagia  Swallow Evaluation Recommendations Recommendations: PO diet PO Diet Recommendation: Regular;Thin liquids (Level 0) Liquid Administration via: Cup;Straw Medication Administration: Whole meds with liquid Supervision: Patient able to self-feed Swallowing strategies  : Minimize environmental distractions;Slow rate;Small bites/sips Postural changes: Position pt fully  upright for meals;Stay upright 30-60 min after meals Oral care recommendations: Oral care BID (2x/day) Recommended consults: Consider GI consultation   Cresencia Asmus B. Dreama Saa, M.S., CCC-SLP, Tree surgeon Certified Brain Injury Specialist [facility]  [facility] Rehabilitation Services Office [phone] Ascom [phone] Fax [phone]

## 2023-04-14 ENCOUNTER — Ambulatory Visit (INDEPENDENT_AMBULATORY_CARE_PROVIDER_SITE_OTHER): Payer: Medicare HMO | Admitting: Vascular Surgery

## 2023-05-12 ENCOUNTER — Ambulatory Visit (INDEPENDENT_AMBULATORY_CARE_PROVIDER_SITE_OTHER): Payer: Medicare HMO | Admitting: Vascular Surgery

## 2023-05-12 ENCOUNTER — Encounter (INDEPENDENT_AMBULATORY_CARE_PROVIDER_SITE_OTHER): Payer: Self-pay | Admitting: Vascular Surgery

## 2023-05-12 VITALS — BP 119/79 | HR 74 | Resp 16

## 2023-05-12 DIAGNOSIS — M7989 Other specified soft tissue disorders: Secondary | ICD-10-CM | POA: Diagnosis not present

## 2023-05-12 DIAGNOSIS — I480 Paroxysmal atrial fibrillation: Secondary | ICD-10-CM | POA: Diagnosis not present

## 2023-05-12 DIAGNOSIS — I890 Lymphedema, not elsewhere classified: Secondary | ICD-10-CM | POA: Diagnosis not present

## 2023-05-12 DIAGNOSIS — I10 Essential (primary) hypertension: Secondary | ICD-10-CM

## 2023-05-12 NOTE — Progress Notes (Signed)
MRN : [id number]  Suzanne Dixon is a 72 y.o. ([birth year]) female who presents with chief complaint of  Chief Complaint  Patient presents with   Follow-up    1 yr follow up  .  History of Present Illness: Patient returns today in follow up of her lymphedema.  Her lower leg swelling is significantly improved wearing her compression socks daily.  She still has a lot of thigh swelling and enlargement but this is much better than it was before.  No ulceration or infection.  No chest pain or shortness of breath.  She is not particularly mobile.  She does try to elevate her legs is much as possible.  She does wear her compression socks every day.  Current Outpatient Medications  Medication Sig Dispense Refill   allopurinol (ZYLOPRIM) 300 MG tablet Take 300 mg by mouth daily.      Calcium Carb-Cholecalciferol 600-400 MG-UNIT TABS Take 1 tablet by mouth 2 (two) times daily with a meal.     carbidopa-levodopa (SINEMET IR) 25-100 MG tablet Take 2 tablets by mouth 3 (three) times daily.     DULoxetine (CYMBALTA) 60 MG capsule Take 60 mg by mouth 2 (two) times daily.     gabapentin (NEURONTIN) 300 MG capsule Take 300 mg by mouth 4 (four) times daily.     Homeopathic Products (PSORIASIS/ECZEMA RELIEF EX) Apply 1 application topically daily as needed (eczema).     levothyroxine (SYNTHROID, LEVOTHROID) 50 MCG tablet Take 50 mcg by mouth daily before breakfast.     Magnesium 400 MG CAPS Take 400 mg by mouth daily.     meloxicam (MOBIC) 15 MG tablet Take 15 mg by mouth daily.     metoprolol tartrate (LOPRESSOR) 100 MG tablet Take 1 tablet (100 mg total) by mouth 2 (two) times daily. Reduced from 200 mg.     Multiple Vitamins tablet Take 0.5 tablets by mouth daily. Take 1/2 tablet daily in the afternoon     omeprazole (PRILOSEC) 20 MG capsule Take 20 mg by mouth daily.      oxybutynin (DITROPAN) 5 MG tablet Take 5 mg by mouth 2 (two) times daily.     vitamin B-12 (CYANOCOBALAMIN) 1000 MCG tablet Take  1,000 mcg by mouth daily.     warfarin (COUMADIN) 1 MG tablet Take 1 mg by mouth daily. Take 7 mg by mouth daily (take with 6mg  tablet) in the afternoon.     warfarin (COUMADIN) 6 MG tablet Take 1 tablet (6 mg total) by mouth daily at 4 PM. Reduced from 7 mg. (Patient taking differently: Take 6 mg by mouth daily at 4 PM. Take 7 mg by mouth daily (take with 1mg  tablet) in the afternoon)     triamcinolone (KENALOG) 0.025 % cream Apply 1 application topically 2 (two) times daily as needed. Home med. (Patient not taking: Reported on 01/13/2023) 30 g 0   No current facility-administered medications for this visit.    Past Medical History:  Diagnosis Date   A-fib (HCC)    Anemia    B12 deficiency    Breast cancer (HCC) 03/31/2013   Right - chemo- mastectomy   Breast cancer (HCC) 1991   Rt.- radiation   Complication of anesthesia 01/2009   Recent years with general anesthesia had itching following surgery   DDD (degenerative disc disease), cervical    Depression    Diverticulitis    Dyspnea    Edema of both legs    GERD (gastroesophageal reflux disease)  Gout    H/O mastectomy 2014   per patient   H/O: cesarean section 1987   per patient report   H/O: hysterectomy 1996   per patient   Hydradenitis    Hypertension    Hypothyroidism    Neuropathy    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HCC)    Parkinson's disease    Personal history of chemotherapy    Personal history of radiation therapy    Ruptured cervical disc 2002   per patient    Sleep apnea    cpap machine   Super obese    Tachycardia     Past Surgical History:  Procedure Laterality Date   ABDOMINAL HYSTERECTOMY     BACK SURGERY  2002   plate and 2 screws in neck   BREAST BIOPSY Right 1991,2014   Positive   BREAST CYST ASPIRATION Left    BREAST SURGERY Right 2014   mastectomy   CATARACT EXTRACTION W/PHACO Left 05/17/2019   Procedure: CATARACT EXTRACTION PHACO AND INTRAOCULAR LENS PLACEMENT (IOC);  Surgeon: Nevada Crane, MD;  Location: [facility] ORS;  Service: Ophthalmology;  Laterality: Left;  [place] 00:28 CDE 2.15 FLUID PACK LOT # [id number]   CATARACT EXTRACTION W/PHACO Right 06/14/2019   Procedure: CATARACT EXTRACTION PHACO AND INTRAOCULAR LENS PLACEMENT (IOC) RIGHT;  Surgeon: Nevada Crane, MD;  Location: [facility] ORS;  Service: Ophthalmology;  Laterality: Right;  [place] 00:35.0 CDE 2.37 Fluid Pack Lot #[id number]   CESAREAN SECTION     CHOLECYSTECTOMY  04/09/2012   COLONOSCOPY WITH PROPOFOL N/A 06/22/2015   Procedure: COLONOSCOPY WITH PROPOFOL;  Surgeon: Scot Jun, MD;  Location: [facility];  Service: Endoscopy;  Laterality: N/A;   COLONOSCOPY WITH PROPOFOL N/A 05/25/2021   Procedure: COLONOSCOPY WITH PROPOFOL;  Surgeon: Regis Bill, MD;  Location: [facility] ENDOSCOPY;  Service: Endoscopy;  Laterality: N/A;   ESOPHAGOGASTRODUODENOSCOPY     ESOPHAGOGASTRODUODENOSCOPY N/A 05/25/2021   Procedure: ESOPHAGOGASTRODUODENOSCOPY (EGD);  Surgeon: Regis Bill, MD;  Location: [facility] ENDOSCOPY;  Service: Endoscopy;  Laterality: N/A;   EYE SURGERY     LAPAROSCOPIC GASTRIC BYPASS     MASTECTOMY Right    MASTECTOMY MODIFIED RADICAL     mastectomy partial      lumpectomy   NEUROPLASTY / TRANSPOSITION ULNAR NERVE AT ELBOW     OOPHORECTOMY     PORT-A-CATH REMOVAL Left 05/13/2015   Procedure: REMOVAL PORT-A-CATH LEFT CHEST ;  Surgeon: Lattie Haw, MD;  Location: [facility] ORS;  Service: General;  Laterality: Left;   REDUCTION MAMMAPLASTY Left    REVERSE SHOULDER ARTHROPLASTY Right 12/03/2020   Procedure: REVERSE SHOULDER ARTHROPLASTY;  Surgeon: Christena Flake, MD;  Location: [facility] ORS;  Service: Orthopedics;  Laterality: Right;   ROUX-EN-Y GASTRIC BYPASS  01/26/2009   SHOULDER ARTHROSCOPY WITH OPEN ROTATOR CUFF REPAIR Right 01/03/2017   Procedure: SHOULDER ARTHROSCOPY WITH OPEN ROTATOR CUFF REPAIR;  Surgeon: Christena Flake, MD;  Location: [facility] ORS;  Service: Orthopedics;  Laterality: Right;   SHOULDER ARTHROSCOPY WITH  SUBACROMIAL DECOMPRESSION, ROTATOR CUFF REPAIR AND BICEP TENDON REPAIR Right 01/03/2017   Procedure: SHOULDER ARTHROSCOPY WITH SUBACROMIAL DECOMPRESSION, ROTATOR CUFF REPAIR AND BICEP TENDON REPAIR;  Surgeon: Christena Flake, MD;  Location: [facility] ORS;  Service: Orthopedics;  Laterality: Right;  Limited debridement   SPINE SURGERY       Social History   Tobacco Use   Smoking status: Never   Smokeless tobacco: Never  Substance Use Topics   Alcohol use: No    Alcohol/week: 0.0 standard  drinks of alcohol   Drug use: No      Family History  Problem Relation Age of Onset   Hypertension Mother    Lymphoma Mother    Cancer Sister        Breast   Breast cancer Sister 35     Allergies  Allergen Reactions   Advair Diskus [Fluticasone-Salmeterol] Other (See Comments)    Joint pain    Alendronate     Muscle pain   Singulair [Montelukast] Other (See Comments)    "muscle pain"   Sulfa Antibiotics Hives   Venlafaxine Other (See Comments)    "altered mental status/tremors"     REVIEW OF SYSTEMS (Negative unless checked)   Constitutional: [] Weight loss  [] Fever  [] Chills Cardiac: [] Chest pain   [] Chest pressure   [] Palpitations   [] Shortness of breath when laying flat   [] Shortness of breath at rest   [x] Shortness of breath with exertion. Vascular:  [] Pain in legs with walking   [] Pain in legs at rest   [] Pain in legs when laying flat   [] Claudication   [] Pain in feet when walking  [] Pain in feet at rest  [] Pain in feet when laying flat   [] History of DVT   [] Phlebitis   [x] Swelling in legs   [] Varicose veins   [] Non-healing ulcers Pulmonary:   [] Uses home oxygen   [] Productive cough   [] Hemoptysis   [] Wheeze  [] COPD   [] Asthma Neurologic:  [] Dizziness  [] Blackouts   [] Seizures   [x] History of stroke   [] History of TIA  [] Aphasia   [] Temporary blindness   [] Dysphagia   [] Weakness or numbness in arms   [] Weakness or numbness in legs Musculoskeletal:  [x] Arthritis   [] Joint swelling    [x] Joint pain   [] Low back pain Hematologic:  [] Easy bruising  [] Easy bleeding   [] Hypercoagulable state   [] Anemic   Gastrointestinal:  [] Blood in stool   [] Vomiting blood  [] Gastroesophageal reflux/heartburn   [] Abdominal pain Genitourinary:  [] Chronic kidney disease   [] Difficult urination  [] Frequent urination  [] Burning with urination   [] Hematuria Skin:  [] Rashes   [] Ulcers   [] Wounds Psychological:  [] History of anxiety   []  History of major depression.  Physical Examination  BP 119/79 (BP Location: Left Arm)   Pulse 74   Resp 16  Gen:  WD/WN, NAD Head: [place]/AT, No temporalis wasting. Ear/Nose/Throat: Hearing grossly intact, nares w/o erythema or drainage Eyes: Conjunctiva clear. Sclera non-icteric Neck: Supple.  Trachea midline Pulmonary:  Good air movement, no use of accessory muscles.  Cardiac: RRR, no JVD Vascular:  Vessel Right Left  Radial Palpable Palpable                          PT Trace Palpable Trace Palpable  DP 1+ Palpable 1+ Palpable   Gastrointestinal: soft, non-tender/non-distended. No guarding/reflex.  Musculoskeletal: M/S 5/5 throughout.  No deformity or atrophy. 1+ RLE edema, trace LLE edema. Neurologic: Sensation grossly intact in extremities.  Symmetrical.  Speech is fluent.  Psychiatric: Judgment intact, Mood & affect appropriate for pt's clinical situation. Dermatologic: No rashes or ulcers noted.  No cellulitis or open wounds.      Labs No results found for this or any previous visit (from the past 2160 hour(s)).  Radiology No results found.  Assessment/Plan AF (paroxysmal atrial fibrillation) (HCC) Poor cardiac function can worsen lower extremity swelling   Hypertension blood pressure control important in reducing the progression of atherosclerotic disease. On appropriate oral  medications.    Lymphedema Swelling is under good control with compression socks, elevation, and she does have a lymphedema pump although she is not using it  regularly currently.  She is not very mobile.  She is going to continue with these conservative measures is much as possible.  We will continue to follow her on an annual basis.    Festus Barren, MD  05/12/2023 11:24 AM    This note was created with Dragon medical transcription system.  Any errors from dictation are purely unintentional

## 2023-05-12 NOTE — Assessment & Plan Note (Signed)
Swelling is under good control with compression socks, elevation, and she does have a lymphedema pump although she is not using it regularly currently.  She is not very mobile.  She is going to continue with these conservative measures is much as possible.  We will continue to follow her on an annual basis.

## 2023-06-13 ENCOUNTER — Other Ambulatory Visit: Payer: Self-pay | Admitting: Internal Medicine

## 2023-06-13 DIAGNOSIS — R1031 Right lower quadrant pain: Secondary | ICD-10-CM

## 2023-06-13 DIAGNOSIS — R300 Dysuria: Secondary | ICD-10-CM

## 2023-06-21 ENCOUNTER — Ambulatory Visit
Admission: RE | Admit: 2023-06-21 | Discharge: 2023-06-21 | Disposition: A | Discharge status: Home / Self Care | Payer: Medicare HMO | Source: Ambulatory Visit | Attending: Internal Medicine | Admitting: Internal Medicine

## 2023-06-21 DIAGNOSIS — R1031 Right lower quadrant pain: Secondary | ICD-10-CM | POA: Diagnosis present

## 2023-06-21 DIAGNOSIS — R300 Dysuria: Secondary | ICD-10-CM | POA: Diagnosis present

## 2023-06-29 ENCOUNTER — Other Ambulatory Visit: Payer: Self-pay

## 2023-06-29 ENCOUNTER — Emergency Department: Payer: Medicare HMO

## 2023-06-29 ENCOUNTER — Inpatient Hospital Stay
Admission: EM | Admit: 2023-06-29 | Discharge: 2023-07-13 | DRG: 402 | Disposition: A | Discharge status: Skilled Nursing Facility | Payer: Medicare HMO | Attending: Internal Medicine | Admitting: Internal Medicine

## 2023-06-29 DIAGNOSIS — M4714 Other spondylosis with myelopathy, thoracic region: Secondary | ICD-10-CM

## 2023-06-29 DIAGNOSIS — K5900 Constipation, unspecified: Secondary | ICD-10-CM | POA: Diagnosis present

## 2023-06-29 DIAGNOSIS — Z9842 Cataract extraction status, left eye: Secondary | ICD-10-CM

## 2023-06-29 DIAGNOSIS — Z7901 Long term (current) use of anticoagulants: Secondary | ICD-10-CM

## 2023-06-29 DIAGNOSIS — G20C Parkinsonism, unspecified: Secondary | ICD-10-CM | POA: Diagnosis present

## 2023-06-29 DIAGNOSIS — Z9884 Bariatric surgery status: Secondary | ICD-10-CM

## 2023-06-29 DIAGNOSIS — G4733 Obstructive sleep apnea (adult) (pediatric): Secondary | ICD-10-CM | POA: Diagnosis present

## 2023-06-29 DIAGNOSIS — Z1611 Resistance to penicillins: Secondary | ICD-10-CM | POA: Diagnosis present

## 2023-06-29 DIAGNOSIS — Z888 Allergy status to other drugs, medicaments and biological substances status: Secondary | ICD-10-CM

## 2023-06-29 DIAGNOSIS — Z751 Person awaiting admission to adequate facility elsewhere: Secondary | ICD-10-CM

## 2023-06-29 DIAGNOSIS — I890 Lymphedema, not elsewhere classified: Secondary | ICD-10-CM

## 2023-06-29 DIAGNOSIS — L89312 Pressure ulcer of right buttock, stage 2: Secondary | ICD-10-CM | POA: Diagnosis present

## 2023-06-29 DIAGNOSIS — M545 Low back pain, unspecified: Secondary | ICD-10-CM

## 2023-06-29 DIAGNOSIS — I639 Cerebral infarction, unspecified: Secondary | ICD-10-CM | POA: Diagnosis present

## 2023-06-29 DIAGNOSIS — M47816 Spondylosis without myelopathy or radiculopathy, lumbar region: Secondary | ICD-10-CM | POA: Diagnosis present

## 2023-06-29 DIAGNOSIS — I480 Paroxysmal atrial fibrillation: Secondary | ICD-10-CM | POA: Diagnosis not present

## 2023-06-29 DIAGNOSIS — M48061 Spinal stenosis, lumbar region without neurogenic claudication: Secondary | ICD-10-CM | POA: Diagnosis present

## 2023-06-29 DIAGNOSIS — M4804 Spinal stenosis, thoracic region: Secondary | ICD-10-CM | POA: Diagnosis not present

## 2023-06-29 DIAGNOSIS — Z8673 Personal history of transient ischemic attack (TIA), and cerebral infarction without residual deficits: Secondary | ICD-10-CM

## 2023-06-29 DIAGNOSIS — G20A1 Parkinson's disease without dyskinesia, without mention of fluctuations: Secondary | ICD-10-CM | POA: Diagnosis present

## 2023-06-29 DIAGNOSIS — Z961 Presence of intraocular lens: Secondary | ICD-10-CM | POA: Diagnosis present

## 2023-06-29 DIAGNOSIS — E669 Obesity, unspecified: Secondary | ICD-10-CM | POA: Diagnosis present

## 2023-06-29 DIAGNOSIS — Z803 Family history of malignant neoplasm of breast: Secondary | ICD-10-CM

## 2023-06-29 DIAGNOSIS — M5415 Radiculopathy, thoracolumbar region: Secondary | ICD-10-CM | POA: Diagnosis present

## 2023-06-29 DIAGNOSIS — C91Z0 Other lymphoid leukemia not having achieved remission: Secondary | ICD-10-CM | POA: Diagnosis present

## 2023-06-29 DIAGNOSIS — G4730 Sleep apnea, unspecified: Secondary | ICD-10-CM | POA: Diagnosis not present

## 2023-06-29 DIAGNOSIS — N3001 Acute cystitis with hematuria: Secondary | ICD-10-CM

## 2023-06-29 DIAGNOSIS — Z9841 Cataract extraction status, right eye: Secondary | ICD-10-CM

## 2023-06-29 DIAGNOSIS — Z6841 Body Mass Index (BMI) 40.0 and over, adult: Secondary | ICD-10-CM

## 2023-06-29 DIAGNOSIS — Z8616 Personal history of COVID-19: Secondary | ICD-10-CM

## 2023-06-29 DIAGNOSIS — Z923 Personal history of irradiation: Secondary | ICD-10-CM

## 2023-06-29 DIAGNOSIS — Z90710 Acquired absence of both cervix and uterus: Secondary | ICD-10-CM

## 2023-06-29 DIAGNOSIS — R824 Acetonuria: Secondary | ICD-10-CM | POA: Diagnosis present

## 2023-06-29 DIAGNOSIS — R001 Bradycardia, unspecified: Secondary | ICD-10-CM | POA: Diagnosis present

## 2023-06-29 DIAGNOSIS — Z8249 Family history of ischemic heart disease and other diseases of the circulatory system: Secondary | ICD-10-CM

## 2023-06-29 DIAGNOSIS — M5104 Intervertebral disc disorders with myelopathy, thoracic region: Secondary | ICD-10-CM | POA: Diagnosis present

## 2023-06-29 DIAGNOSIS — Z87440 Personal history of urinary (tract) infections: Secondary | ICD-10-CM

## 2023-06-29 DIAGNOSIS — I10 Essential (primary) hypertension: Secondary | ICD-10-CM | POA: Diagnosis present

## 2023-06-29 DIAGNOSIS — C50919 Malignant neoplasm of unspecified site of unspecified female breast: Secondary | ICD-10-CM | POA: Diagnosis present

## 2023-06-29 DIAGNOSIS — Z807 Family history of other malignant neoplasms of lymphoid, hematopoietic and related tissues: Secondary | ICD-10-CM

## 2023-06-29 DIAGNOSIS — Z9011 Acquired absence of right breast and nipple: Secondary | ICD-10-CM

## 2023-06-29 DIAGNOSIS — E039 Hypothyroidism, unspecified: Secondary | ICD-10-CM | POA: Diagnosis present

## 2023-06-29 DIAGNOSIS — M5030 Other cervical disc degeneration, unspecified cervical region: Secondary | ICD-10-CM | POA: Diagnosis present

## 2023-06-29 DIAGNOSIS — Z79899 Other long term (current) drug therapy: Secondary | ICD-10-CM

## 2023-06-29 DIAGNOSIS — M109 Gout, unspecified: Secondary | ICD-10-CM | POA: Diagnosis present

## 2023-06-29 DIAGNOSIS — Z853 Personal history of malignant neoplasm of breast: Secondary | ICD-10-CM

## 2023-06-29 DIAGNOSIS — N3000 Acute cystitis without hematuria: Principal | ICD-10-CM

## 2023-06-29 DIAGNOSIS — Z9221 Personal history of antineoplastic chemotherapy: Secondary | ICD-10-CM

## 2023-06-29 DIAGNOSIS — M549 Dorsalgia, unspecified: Secondary | ICD-10-CM

## 2023-06-29 DIAGNOSIS — M4805 Spinal stenosis, thoracolumbar region: Secondary | ICD-10-CM | POA: Diagnosis present

## 2023-06-29 DIAGNOSIS — R29898 Other symptoms and signs involving the musculoskeletal system: Secondary | ICD-10-CM

## 2023-06-29 DIAGNOSIS — Z79890 Hormone replacement therapy (postmenopausal): Secondary | ICD-10-CM

## 2023-06-29 DIAGNOSIS — G629 Polyneuropathy, unspecified: Secondary | ICD-10-CM | POA: Diagnosis present

## 2023-06-29 DIAGNOSIS — K219 Gastro-esophageal reflux disease without esophagitis: Secondary | ICD-10-CM | POA: Diagnosis present

## 2023-06-29 DIAGNOSIS — Z96611 Presence of right artificial shoulder joint: Secondary | ICD-10-CM | POA: Diagnosis present

## 2023-06-29 DIAGNOSIS — Z791 Long term (current) use of non-steroidal anti-inflammatories (NSAID): Secondary | ICD-10-CM

## 2023-06-29 DIAGNOSIS — Z882 Allergy status to sulfonamides status: Secondary | ICD-10-CM

## 2023-06-29 LAB — CBC WITH DIFFERENTIAL/PLATELET
Abs Immature Granulocytes: 0.01 10*3/uL (ref 0.00–0.07)
Basophils Absolute: 0 10*3/uL (ref 0.0–0.1)
Basophils Relative: 1 %
Eosinophils Absolute: 0.1 10*3/uL (ref 0.0–0.5)
Eosinophils Relative: 2 %
HCT: 39.2 % (ref 36.0–46.0)
Hemoglobin: 13.5 g/dL (ref 12.0–15.0)
Immature Granulocytes: 0 %
Lymphocytes Relative: 55 %
Lymphs Abs: 3 10*3/uL (ref 0.7–4.0)
MCH: 31.4 pg (ref 26.0–34.0)
MCHC: 34.4 g/dL (ref 30.0–36.0)
MCV: 91.2 fL (ref 80.0–100.0)
Monocytes Absolute: 0.5 10*3/uL (ref 0.1–1.0)
Monocytes Relative: 9 %
Neutro Abs: 1.7 10*3/uL (ref 1.7–7.7)
Neutrophils Relative %: 33 %
Platelets: 235 10*3/uL (ref 150–400)
RBC: 4.3 MIL/uL (ref 3.87–5.11)
RDW: 13.8 % (ref 11.5–15.5)
WBC: 5.3 10*3/uL (ref 4.0–10.5)
nRBC: 0 % (ref 0.0–0.2)

## 2023-06-29 LAB — BASIC METABOLIC PANEL
Anion gap: 7 (ref 5–15)
BUN: 15 mg/dL (ref 8–23)
CO2: 29 mmol/L (ref 22–32)
Calcium: 8.3 mg/dL — ABNORMAL LOW (ref 8.9–10.3)
Chloride: 99 mmol/L (ref 98–111)
Creatinine, Ser: 0.65 mg/dL (ref 0.44–1.00)
GFR, Estimated: >60 mL/min (ref >60)
Glucose, Bld: 89 mg/dL (ref 70–99)
Potassium: 3.5 mmol/L (ref 3.5–5.1)
Sodium: 135 mmol/L (ref 135–145)

## 2023-06-29 LAB — PROTIME-INR
INR: 3.2 — ABNORMAL HIGH (ref 0.8–1.2)
Prothrombin Time: 33 s — ABNORMAL HIGH (ref 11.4–15.2)

## 2023-06-29 LAB — URINALYSIS, ROUTINE W REFLEX MICROSCOPIC
Bacteria, UA: NONE SEEN
Bilirubin Urine: NEGATIVE
Glucose, UA: NEGATIVE mg/dL
Ketones, ur: 5 mg/dL — AB
Nitrite: NEGATIVE
Protein, ur: NEGATIVE mg/dL
Specific Gravity, Urine: 1.021 (ref 1.005–1.030)
WBC, UA: >50 WBC/hpf (ref 0–5)
pH: 5 (ref 5.0–8.0)

## 2023-06-29 LAB — APTT: aPTT: 37 s — ABNORMAL HIGH (ref 24–36)

## 2023-06-29 LAB — CK: Total CK: 68 U/L (ref 38–234)

## 2023-06-29 MED ORDER — ACETAMINOPHEN 325 MG PO TABS
650.0000 mg | ORAL_TABLET | Freq: Four times a day (QID) | ORAL | Status: DC
  Administered 2023-06-29 – 2023-07-13 (×43): 650 mg via ORAL
  Filled 2023-06-29 (×45): qty 2

## 2023-06-29 MED ORDER — ALLOPURINOL 100 MG PO TABS
300.0000 mg | ORAL_TABLET | Freq: Every day | ORAL | Status: DC
  Administered 2023-06-30 – 2023-07-13 (×13): 300 mg via ORAL
  Filled 2023-06-29 (×14): qty 3

## 2023-06-29 MED ORDER — MELOXICAM 7.5 MG PO TABS
15.0000 mg | ORAL_TABLET | Freq: Every day | ORAL | Status: DC
  Administered 2023-06-29 – 2023-07-13 (×14): 15 mg via ORAL
  Filled 2023-06-29 (×15): qty 2

## 2023-06-29 MED ORDER — DULOXETINE HCL 30 MG PO CPEP
60.0000 mg | ORAL_CAPSULE | Freq: Two times a day (BID) | ORAL | Status: DC
  Administered 2023-06-29 – 2023-07-13 (×27): 60 mg via ORAL
  Filled 2023-06-29 (×27): qty 2

## 2023-06-29 MED ORDER — POLYETHYLENE GLYCOL 3350 17 G PO PACK
17.0000 g | PACK | Freq: Every day | ORAL | Status: DC | PRN
  Administered 2023-07-02: 17 g via ORAL
  Filled 2023-06-29: qty 1

## 2023-06-29 MED ORDER — OYSTER SHELL CALCIUM/D3 500-5 MG-MCG PO TABS
ORAL_TABLET | Freq: Two times a day (BID) | ORAL | Status: DC
  Administered 2023-06-29 – 2023-06-30 (×3): 1 via ORAL
  Administered 2023-07-01 – 2023-07-05 (×10): 2 via ORAL
  Administered 2023-07-06 – 2023-07-13 (×12): 1 via ORAL
  Filled 2023-06-29 (×2): qty 1
  Filled 2023-06-29 (×3): qty 2
  Filled 2023-06-29 (×7): qty 1
  Filled 2023-06-29 (×2): qty 2
  Filled 2023-06-29: qty 1
  Filled 2023-06-29: qty 2
  Filled 2023-06-29: qty 1
  Filled 2023-06-29 (×2): qty 2
  Filled 2023-06-29 (×2): qty 1
  Filled 2023-06-29 (×2): qty 2
  Filled 2023-06-29 (×4): qty 1

## 2023-06-29 MED ORDER — ENSURE ENLIVE PO LIQD
237.0000 mL | Freq: Two times a day (BID) | ORAL | Status: DC
  Administered 2023-06-29 – 2023-07-13 (×21): 237 mL via ORAL

## 2023-06-29 MED ORDER — WARFARIN - PHARMACIST DOSING INPATIENT: Freq: Every day | Status: DC

## 2023-06-29 MED ORDER — VITAMIN B-12 1000 MCG PO TABS
1000.0000 ug | ORAL_TABLET | Freq: Every day | ORAL | Status: DC
  Administered 2023-06-29 – 2023-07-13 (×14): 1000 ug via ORAL
  Filled 2023-06-29 (×3): qty 1
  Filled 2023-06-29: qty 2
  Filled 2023-06-29 (×11): qty 1

## 2023-06-29 MED ORDER — LEVOTHYROXINE SODIUM 50 MCG PO TABS
50.0000 ug | ORAL_TABLET | Freq: Every day | ORAL | Status: DC
  Administered 2023-06-30 – 2023-07-13 (×13): 50 ug via ORAL
  Filled 2023-06-29 (×14): qty 1

## 2023-06-29 MED ORDER — CARBIDOPA-LEVODOPA 25-100 MG PO TABS
2.0000 | ORAL_TABLET | Freq: Three times a day (TID) | ORAL | Status: DC
  Administered 2023-06-29 – 2023-07-13 (×41): 2 via ORAL
  Filled 2023-06-29 (×42): qty 2

## 2023-06-29 MED ORDER — ONDANSETRON HCL 4 MG PO TABS: 4.0000 mg | ORAL_TABLET | Freq: Four times a day (QID) | ORAL | Status: DC | PRN

## 2023-06-29 MED ORDER — ONDANSETRON HCL 4 MG/2ML IJ SOLN: 4.0000 mg | Freq: Four times a day (QID) | INTRAMUSCULAR | Status: DC | PRN

## 2023-06-29 MED ORDER — LIDOCAINE 5 % EX PTCH
1.0000 | MEDICATED_PATCH | CUTANEOUS | Status: DC
  Administered 2023-06-29 – 2023-07-10 (×8): 1 via TRANSDERMAL
  Filled 2023-06-29 (×15): qty 1

## 2023-06-29 MED ORDER — GABAPENTIN 300 MG PO CAPS
300.0000 mg | ORAL_CAPSULE | Freq: Four times a day (QID) | ORAL | Status: DC
  Administered 2023-06-29 – 2023-07-13 (×51): 300 mg via ORAL
  Filled 2023-06-29 (×52): qty 1

## 2023-06-29 MED ORDER — MAGNESIUM OXIDE -MG SUPPLEMENT 400 (240 MG) MG PO TABS
400.0000 mg | ORAL_TABLET | Freq: Every day | ORAL | Status: DC
  Administered 2023-06-29 – 2023-07-13 (×14): 400 mg via ORAL
  Filled 2023-06-29 (×14): qty 1

## 2023-06-29 MED ORDER — SODIUM CHLORIDE 0.9 % IV SOLN
1.0000 g | Freq: Once | INTRAVENOUS | Status: AC
  Administered 2023-06-29: 1 g via INTRAVENOUS
  Filled 2023-06-29: qty 10

## 2023-06-29 MED ORDER — METHOCARBAMOL 500 MG PO TABS
500.0000 mg | ORAL_TABLET | Freq: Four times a day (QID) | ORAL | Status: DC | PRN
  Administered 2023-06-29 – 2023-07-12 (×7): 500 mg via ORAL
  Filled 2023-06-29 (×7): qty 1

## 2023-06-29 MED ORDER — METOPROLOL TARTRATE 25 MG PO TABS
25.0000 mg | ORAL_TABLET | Freq: Two times a day (BID) | ORAL | Status: DC
  Administered 2023-06-29 – 2023-07-13 (×26): 25 mg via ORAL
  Filled 2023-06-29 (×27): qty 1

## 2023-06-29 MED ORDER — HYDROCODONE-ACETAMINOPHEN 5-325 MG PO TABS: 1.0000 | ORAL_TABLET | Freq: Four times a day (QID) | ORAL | Status: DC | PRN

## 2023-06-29 MED ORDER — METOPROLOL TARTRATE 25 MG PO TABS: 100.0000 mg | ORAL_TABLET | Freq: Two times a day (BID) | ORAL | Status: DC

## 2023-06-29 MED ORDER — SODIUM CHLORIDE 0.9 % IV SOLN
1.0000 g | INTRAVENOUS | Status: AC
  Administered 2023-06-30 – 2023-07-02 (×3): 1 g via INTRAVENOUS
  Filled 2023-06-29 (×3): qty 10

## 2023-06-29 NOTE — Assessment & Plan Note (Signed)
- 

## 2023-06-29 NOTE — ED Provider Notes (Signed)
[facility] Provider Note    Event Date/Time   First MD Initiated Contact with Patient 06/29/23 1014     (approximate)   History   Chief Complaint Back Pain (Back Pain; Recurrent UTI)   HPI  ASHLLEY Dixon is a 72 y.o. female with past medical history of hypertension, atrial fibrillation, stroke, breast cancer, gout, and hypothyroidism who presents to the ED complaining of back pain.  Patient reports that she has been dealing with increasing pain in the middle of her lower back for the past 8 days.  She states that the pain came on after she had to lay flat on a table for CT imaging.  She was undergoing CT imaging of her abdomen/pelvis due to recurrent UTIs over the past 2 months.  She is currently taking cefdinir for this, which was started 3 days ago.  She denies any fevers, flank pain, abdominal pain, nausea, or vomiting, but does endorse ongoing dysuria.  She was also prescribed Norco for her back, states this has not been alleviating her symptoms.  She is typically able to stand and take a few steps, but last night was unable to get up out of her wheelchair and so spent the night there.  She reports feeling weak in both of her legs, but denies any saddle anesthesia and has not had any urinary incontinence.     Physical Exam   Triage Vital Signs: ED Triage Vitals [06/29/23 1010]  Encounter Vitals Group     BP      Systolic BP Percentile      Diastolic BP Percentile      Pulse      Resp      Temp      Temp src      SpO2 97 %     Weight      Height      Head Circumference      Peak Flow      Pain Score      Pain Loc      Pain Education      Exclude from Growth Chart     Most recent vital signs: Vitals:   06/29/23 1300 06/29/23 1444  BP: (!) 159/94   Pulse: (!) 59   Resp: 20   Temp:  98.1 F (36.7 C)  SpO2: 98%     Constitutional: Alert and oriented. Eyes: Conjunctivae are normal. Head: Atraumatic. Nose: No  congestion/rhinnorhea. Mouth/Throat: Mucous membranes are moist.  Cardiovascular: Normal rate, regular rhythm. Grossly normal heart sounds.  2+ radial and DP pulses bilaterally. Respiratory: Normal respiratory effort.  No retractions. Lungs CTAB. Gastrointestinal: Soft and nontender.  No CVA tenderness bilaterally.  No distention. Musculoskeletal: No lower extremity tenderness nor edema.  Neurologic:  Normal speech and language.  Strength exam in lower extremities limited due to pain, strength grossly intact.    ED Results / Procedures / Treatments   Labs (all labs ordered are listed, but only abnormal results are displayed) Labs Reviewed  BASIC METABOLIC PANEL - Abnormal; Notable for the following components:      Result Value   Calcium 8.3 (*)    All other components within normal limits  URINALYSIS, ROUTINE W REFLEX MICROSCOPIC - Abnormal; Notable for the following components:   Color, Urine YELLOW (*)    APPearance HAZY (*)    Hgb urine dipstick MODERATE (*)    Ketones, ur 5 (*)    Leukocytes,Ua MODERATE (*)    All other  components within normal limits  URINE CULTURE  CBC WITH DIFFERENTIAL/PLATELET  CK  PROTIME-INR  APTT     EKG  ED ECG REPORT I, Chesley Noon, the attending physician, personally viewed and interpreted this ECG.   Date: 06/29/2023  EKG Time: 10:23  Rate: 59  Rhythm: sinus bradycardia  Axis: LAD  Intervals:left anterior fascicular block  ST&T Change: None  RADIOLOGY CT lumbar spine reviewed and interpreted by me with no fracture or dislocation.  PROCEDURES:  Critical Care performed: No  Procedures   MEDICATIONS ORDERED IN ED: Medications  lidocaine (LIDODERM) 5 % 1 patch (1 patch Transdermal Patch Applied 06/29/23 1028)  acetaminophen (TYLENOL) tablet 650 mg (650 mg Oral Given 06/29/23 1441)  methocarbamol (ROBAXIN) tablet 500 mg (has no administration in time range)  ondansetron (ZOFRAN) tablet 4 mg (has no administration in time  range)    Or  ondansetron (ZOFRAN) injection 4 mg (has no administration in time range)  polyethylene glycol (MIRALAX / GLYCOLAX) packet 17 g (has no administration in time range)  HYDROcodone-acetaminophen (NORCO/VICODIN) 5-325 MG per tablet 1 tablet (has no administration in time range)  carbidopa-levodopa (SINEMET IR) 25-100 MG per tablet immediate release 2 tablet (2 tablets Oral Given 06/29/23 1441)  calcium-vitamin D (OSCAL WITH D) 500-5 MG-MCG per tablet (has no administration in time range)  allopurinol (ZYLOPRIM) tablet 300 mg (has no administration in time range)  DULoxetine (CYMBALTA) DR capsule 60 mg (has no administration in time range)  gabapentin (NEURONTIN) capsule 300 mg (300 mg Oral Given 06/29/23 1441)  levothyroxine (SYNTHROID) tablet 50 mcg (has no administration in time range)  magnesium oxide (MAG-OX) tablet 400 mg (400 mg Oral Given 06/29/23 1441)  meloxicam (MOBIC) tablet 15 mg (15 mg Oral Given 06/29/23 1441)  cyanocobalamin (VITAMIN B12) tablet 1,000 mcg (1,000 mcg Oral Given 06/29/23 1441)  metoprolol tartrate (LOPRESSOR) tablet 25 mg (has no administration in time range)  cefTRIAXone (ROCEPHIN) 1 g in sodium chloride 0.9 % 100 mL IVPB (0 g Intravenous Stopped 06/29/23 1409)     IMPRESSION / MDM / ASSESSMENT AND PLAN / ED COURSE  I reviewed the triage vital signs and the nursing notes.                              72 y.o. female with past medical history of hypertension, atrial fibrillation, breast cancer, stroke, gout, and hypothyroidism who presents to the ED complaining of increasing back pain over the past week with ongoing dysuria despite antibiotics.  Patient's presentation is most consistent with acute presentation with potential threat to life or bodily function.  Differential diagnosis includes, but is not limited to, lumbar strain, compression fracture, lumbar radiculopathy, cauda equina, pyelonephritis, cystitis, anemia, electrolyte abnormality,  AKI.  Patient nontoxic-appearing and in no acute distress, vital signs are unremarkable.  She appears neurovascularly intact to her bilateral lower extremities, no findings concerning for cauda equina.  She does have tenderness to palpation over her midline lumbar spine, will further assess with CT of lumbar spine as she may have had an injury while being moved for recent CT imaging.  I have also asked radiology to expedite read on her outpatient CT renal protocol.  Labs and urinalysis pending at this time, we will treat symptomatically with Lidoderm patch.  CT lumbar spine negative for acute traumatic injury, I was also able to review the results from her recent CT renal protocol which was unremarkable.  Urine does appear concerning  for ongoing UTI and we will send for culture, treat with IV Rocephin.  Patient remains profoundly weak and would benefit from admission for treatment of UTI and back pain.  MRI lumbar spine ordered but suspicion for cauda equina remains low.  Labs with no significant anemia, leukocytosis, tract abnormality, or AKI.  CK level within normal limits.  Case discussed with hospitalist for admission.      FINAL CLINICAL IMPRESSION(S) / ED DIAGNOSES   Final diagnoses:  Acute cystitis without hematuria  Acute midline low back pain without sciatica     Rx / DC Orders   ED Discharge Orders     None        Note:  This document was prepared using Dragon voice recognition software and may include unintentional dictation errors.   Chesley Noon, MD 06/29/23 1535

## 2023-06-29 NOTE — Assessment & Plan Note (Addendum)
Patient on metoprolol 25 mg twice a day.  This is a lower dose than when she came in.  Off warfarin postoperatively.  Would like to hold at least 7 days likely 14.

## 2023-06-29 NOTE — Assessment & Plan Note (Addendum)
On levothyroxine  ?

## 2023-06-29 NOTE — ED Triage Notes (Signed)
Patient brought in by [facility] from home for back pain. Patient's home health nurse called EMS today d/t patient having worsening back pain. Patient has had recurrent UTIs and has been seen by [facility] for this. Patient is currently on antibiotics for the UTI. Patient got a CT scan about 2 weeks ago for further testing d/t the recurring UTIs. Patient feels that she may have "pulled a muscle", because she has been hurting since the CT scan. Patient states that the pain is in her lower back bilaterally and radiates down her legs.

## 2023-06-29 NOTE — Assessment & Plan Note (Addendum)
Continue Sinemet ?

## 2023-06-29 NOTE — Assessment & Plan Note (Addendum)
Follows with oncology, currently under surveillance only.

## 2023-06-29 NOTE — ED Notes (Signed)
This RN and Sydni, RN to bedside to trial pt. Standing. Pt. Was able to stand, hunched over with dyspnea with max assist x2. Pt. Required x2 assist to get back into bed. Several times pt. States she was dizzy as she was changing position. Dr. Larinda Buttery aware.

## 2023-06-29 NOTE — Consult Note (Signed)
Pharmacy Consult Note - Anticoagulation  Pharmacy Consult for warfarin Indication: atrial fibrillation  PATIENT MEASUREMENTS: Height: 5\' 4"  (162.6 cm) Weight: 106 kg (233 lb 11 oz) IBW/kg (Calculated) : 54.7 HEPARIN DW (KG): 79.7  VITAL SIGNS: Temp: 98.1 F (36.7 C) (10/10 2052) Temp Source: Oral (10/10 1600) BP: 116/62 (10/10 2052) Pulse Rate: 77 (10/10 2052)  Recent Labs    06/29/23 1023 06/29/23 2018  HGB 13.5  --   HCT 39.2  --   PLT 235  --   APTT  --  37*  LABPROT  --  33.0*  INR  --  3.2*  CREATININE 0.65  --   CKTOTAL 68  --     Estimated Creatinine Clearance: 73.2 mL/min (by C-G formula based on SCr of 0.65 mg/dL).  PAST MEDICAL HISTORY: Past Medical History:  Diagnosis Date   A-fib (HCC)    Anemia    B12 deficiency    Breast cancer (HCC) 03/31/2013   Right - chemo- mastectomy   Breast cancer (HCC) 1991   Rt.- radiation   Complication of anesthesia 01/2009   Recent years with general anesthesia had itching following surgery   DDD (degenerative disc disease), cervical    Depression    Diverticulitis    Dyspnea    Edema of both legs    GERD (gastroesophageal reflux disease)    Gout    H/O mastectomy 2014   per patient   H/O: cesarean section 1987   per patient report   H/O: hysterectomy 1996   per patient   Hydradenitis    Hypertension    Hypothyroidism    Neuropathy    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HCC)    Parkinson's disease (HCC)    Personal history of chemotherapy    Personal history of radiation therapy    Ruptured cervical disc 2002   per patient    Sleep apnea    cpap machine   Super obese    Tachycardia     ASSESSMENT: 72 y.o. female with PMH Afib (on warfarin), HTN, breast cancer s/p mastectomy and chemoradiation, recurrent UTIs is presenting with acute lower back pain. Patient is on chronic anticoagulation per chart review. Pharmacy has been consulted to initiate and manage heparin intravenous infusion.  Pertinent  medications: Home warfarin regimen  Warfarin 6 mg PO once daily on Tue, Wed, Thu, Sat, Sun  Warfarin 3 mg PO once daily on Mon and Fri Last warfarin dose PTA was evening of 10/9 Patient has 6 mg and 1 mg tablets at home  Goal(s) of therapy: INR 2 - 3 Monitor platelets by anticoagulation protocol: Yes   Baseline anticoagulation labs: Recent Labs    06/29/23 1023 06/29/23 2018  APTT  --  37*  INR  --  3.2*  HGB 13.5  --   PLT 235  --    Date INR Warfarin Dose 10/10 3.2    PLAN: INR slightly above goal today, and patient started on Abx for UTI. Will hold warfarin tonight and re-assess with AM labs.  Varick Keys Rodriguez-Guzman PharmD, BCPS 06/29/2023 9:15 PM

## 2023-06-29 NOTE — Assessment & Plan Note (Addendum)
Treated

## 2023-06-29 NOTE — Assessment & Plan Note (Addendum)
- 

## 2023-06-29 NOTE — H&P (Addendum)
History and Physical    Patient: Suzanne Dixon DOB: 04-09-1951 DOA: 06/29/2023 DOS: the patient was seen and examined on 06/29/2023 PCP: Lynnea Ferrier, MD  Patient coming from: Home  Chief Complaint:  Chief Complaint  Patient presents with   Back Pain    Back Pain; Recurrent UTI   HPI: Suzanne Dixon is a 72 y.o. female with medical history significant of recurrent UTI, Parkinson's disease, hypertension, OSA on CPAP, atrial fibrillation on Warfarin, lymphedema, degenerative disc disease, breast cancer s/p chemo mastectomy and radiation, hypothyroidism, neuropathy, gout, who presents to the ED due to back pain.  Suzanne Dixon states that Suzanne Dixon has been experiencing recurrent UTIs for the last few months.  Suzanne Dixon is currently on her third course of antibiotics that Suzanne Dixon started on the evening of 10/07.  Suzanne Dixon notes that her dysuria has resolved but Suzanne Dixon still experiencing some urgency although it is much improved.  Suzanne Dixon denies any persistent abdominal pain, stating Suzanne Dixon occasionally experiences some right-sided abdominal pain.  Suzanne Dixon states what actually brought her in today was her back pain.  Suzanne Dixon states that when Suzanne Dixon climbed up on the CT scanner on 10/02, Suzanne Dixon had sudden sharp pain in her back that radiated bilaterally around to her groin and down her legs.  The pain has persisted since then, however in the last 24 hours, her pain seemed to have worsened.  When Suzanne Dixon tries to stand up, Suzanne Dixon feels weak and as though Suzanne Dixon is going to fall.  Due to this, Suzanne Dixon stated in her wheelchair all night.  Suzanne Dixon notes that Suzanne Dixon lives alone and is quite worried about suffering a fall.  Suzanne Dixon denies any other back pain or leg pain.  Suzanne Dixon denies any urinary or fecal incontinence.  No paresthesias.   Patient states Suzanne Dixon uses a wheelchair at home but was able to take some steps on her own before.   ED course: On arrival to the ED, patient was hypertensive at 156/98 with heart rate of 68.  Suzanne Dixon was saturating at  100% on room air.  Suzanne Dixon was afebrile at 97.7.Initial workup notable for normal CBC, BMP, CK.  Urinalysis with hematuria, ketonuria, leukocytes, and increased WBC/hpf.  CT renal stone study was obtained with no acute findings within the abdomen or pelvis with bilateral nonobstructive kidney stones.  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  No acute findings.  Patient started on ceftriaxone and TRH contacted for admission.  Review of Systems: As mentioned in the history of present illness. All other systems reviewed and are negative.  Past Medical History:  Diagnosis Date   A-fib (HCC)    Anemia    B12 deficiency    Breast cancer (HCC) 03/31/2013   Right - chemo- mastectomy   Breast cancer (HCC) 1991   Rt.- radiation   Complication of anesthesia 01/2009   Recent years with general anesthesia had itching following surgery   DDD (degenerative disc disease), cervical    Depression    Diverticulitis    Dyspnea    Edema of both legs    GERD (gastroesophageal reflux disease)    Gout    H/O mastectomy 2014   per patient   H/O: cesarean section 1987   per patient report   H/O: hysterectomy 1996   per patient   Hydradenitis    Hypertension    Hypothyroidism    Neuropathy    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HCC)  Parkinson's disease (HCC)    Personal history of chemotherapy    Personal history of radiation therapy    Ruptured cervical disc 2002   per patient    Sleep apnea    cpap machine   Super obese    Tachycardia    Past Surgical History:  Procedure Laterality Date   ABDOMINAL HYSTERECTOMY     BACK SURGERY  2002   plate and 2 screws in neck   BREAST BIOPSY Right 1991,2014   Positive   BREAST CYST ASPIRATION Left    BREAST SURGERY Right 2014   mastectomy   CATARACT EXTRACTION W/PHACO Left 05/17/2019   Procedure: CATARACT EXTRACTION PHACO AND INTRAOCULAR LENS PLACEMENT (IOC);  Surgeon: Nevada Crane,  MD;  Location: [facility] ORS;  Service: Ophthalmology;  Laterality: Left;  [place] 00:28 CDE 2.15 FLUID PACK LOT # [id number]   CATARACT EXTRACTION W/PHACO Right 06/14/2019   Procedure: CATARACT EXTRACTION PHACO AND INTRAOCULAR LENS PLACEMENT (IOC) RIGHT;  Surgeon: Nevada Crane, MD;  Location: [facility];  Service: Ophthalmology;  Laterality: Right;  [place] 00:35.0 CDE 2.37 Fluid Pack Lot #[id number]   CESAREAN SECTION     CHOLECYSTECTOMY  04/09/2012   COLONOSCOPY WITH PROPOFOL N/A 06/22/2015   Procedure: COLONOSCOPY WITH PROPOFOL;  Surgeon: Scot Jun, MD;  Location: [facility] ENDOSCOPY;  Service: Endoscopy;  Laterality: N/A;   COLONOSCOPY WITH PROPOFOL N/A 05/25/2021   Procedure: COLONOSCOPY WITH PROPOFOL;  Surgeon: Regis Bill, MD;  Location: [facility] ENDOSCOPY;  Service: Endoscopy;  Laterality: N/A;   ESOPHAGOGASTRODUODENOSCOPY     ESOPHAGOGASTRODUODENOSCOPY N/A 05/25/2021   Procedure: ESOPHAGOGASTRODUODENOSCOPY (EGD);  Surgeon: Regis Bill, MD;  Location: [facility] ENDOSCOPY;  Service: Endoscopy;  Laterality: N/A;   EYE SURGERY     LAPAROSCOPIC GASTRIC BYPASS     MASTECTOMY Right    MASTECTOMY MODIFIED RADICAL     mastectomy partial      lumpectomy   NEUROPLASTY / TRANSPOSITION ULNAR NERVE AT ELBOW     OOPHORECTOMY     PORT-A-CATH REMOVAL Left 05/13/2015   Procedure: REMOVAL PORT-A-CATH LEFT CHEST ;  Surgeon: Lattie Haw, MD;  Location: [facility] ORS;  Service: General;  Laterality: Left;   REDUCTION MAMMAPLASTY Left    REVERSE SHOULDER ARTHROPLASTY Right 12/03/2020   Procedure: REVERSE SHOULDER ARTHROPLASTY;  Surgeon: Christena Flake, MD;  Location: [facility] ORS;  Service: Orthopedics;  Laterality: Right;   ROUX-EN-Y GASTRIC BYPASS  01/26/2009   SHOULDER ARTHROSCOPY WITH OPEN ROTATOR CUFF REPAIR Right 01/03/2017   Procedure: SHOULDER ARTHROSCOPY WITH OPEN ROTATOR CUFF REPAIR;  Surgeon: Christena Flake, MD;  Location: [facility] ORS;  Service: Orthopedics;  Laterality: Right;   SHOULDER ARTHROSCOPY WITH  SUBACROMIAL DECOMPRESSION, ROTATOR CUFF REPAIR AND BICEP TENDON REPAIR Right 01/03/2017   Procedure: SHOULDER ARTHROSCOPY WITH SUBACROMIAL DECOMPRESSION, ROTATOR CUFF REPAIR AND BICEP TENDON REPAIR;  Surgeon: Christena Flake, MD;  Location: [facility] ORS;  Service: Orthopedics;  Laterality: Right;  Limited debridement   SPINE SURGERY     Social History:  reports that Suzanne Dixon has never smoked. Suzanne Dixon has never used smokeless tobacco. Suzanne Dixon reports that Suzanne Dixon does not drink alcohol and does not use drugs.  Allergies  Allergen Reactions   Advair Diskus [Fluticasone-Salmeterol] Other (See Comments)    Joint pain    Alendronate     Muscle pain   Singulair [Montelukast] Other (See Comments)    "muscle pain"   Sulfa Antibiotics Hives   Venlafaxine Other (See Comments)    "altered mental status/tremors"    Family History  Problem Relation Age of Onset   Hypertension Mother    Lymphoma Mother    Cancer Sister        Breast   Breast cancer Sister 37    Prior to Admission medications   Medication Sig Start Date End Date Taking? Authorizing Provider  allopurinol (ZYLOPRIM) 300 MG tablet Take 300 mg by mouth daily.     [provider]  Calcium Carb-Cholecalciferol 600-400 MG-UNIT TABS Take 1 tablet by mouth 2 (two) times daily with a meal.    [provider]  carbidopa-levodopa (SINEMET IR) 25-100 MG tablet Take 2 tablets by mouth 3 (three) times daily. 06/25/20   [provider]  DULoxetine (CYMBALTA) 60 MG capsule Take 60 mg by mouth 2 (two) times daily. 02/23/21   [provider]  gabapentin (NEURONTIN) 300 MG capsule Take 300 mg by mouth 4 (four) times daily.    [provider]  Homeopathic Products (PSORIASIS/ECZEMA RELIEF EX) Apply 1 application topically daily as needed (eczema).    [provider]  levothyroxine (SYNTHROID, LEVOTHROID) 50 MCG tablet Take 50 mcg by mouth daily before breakfast.    [provider]  Magnesium 400 MG CAPS Take 400  mg by mouth daily.    [provider]  meloxicam (MOBIC) 15 MG tablet Take 15 mg by mouth daily. 12/30/22   [provider]  metoprolol tartrate (LOPRESSOR) 100 MG tablet Take 1 tablet (100 mg total) by mouth 2 (two) times daily. Reduced from 200 mg. 07/02/21   Darlin Priestly, MD  Multiple Vitamins tablet Take 0.5 tablets by mouth daily. Take 1/2 tablet daily in the afternoon    [provider]  omeprazole (PRILOSEC) 20 MG capsule Take 20 mg by mouth daily.     [provider]  oxybutynin (DITROPAN) 5 MG tablet Take 5 mg by mouth 2 (two) times daily.    [provider]  triamcinolone (KENALOG) 0.025 % cream Apply 1 application topically 2 (two) times daily as needed. Home med. Patient not taking: Reported on 01/13/2023 07/02/21   Darlin Priestly, MD  vitamin B-12 (CYANOCOBALAMIN) 1000 MCG tablet Take 1,000 mcg by mouth daily.    [provider]  warfarin (COUMADIN) 1 MG tablet Take 1 mg by mouth daily. Take 7 mg by mouth daily (take with 6mg  tablet) in the afternoon. 04/26/22   [provider]  warfarin (COUMADIN) 6 MG tablet Take 1 tablet (6 mg total) by mouth daily at 4 PM. Reduced from 7 mg. Patient taking differently: Take 6 mg by mouth daily at 4 PM. Take 7 mg by mouth daily (take with 1mg  tablet) in the afternoon 07/02/21   Darlin Priestly, MD    Physical Exam: Vitals:   06/29/23 1130 06/29/23 1215 06/29/23 1300 06/29/23 1444  BP: (!) 151/95  (!) 159/94   Pulse: 74 73 (!) 59   Resp: 17 19 20    Temp:    98.1 F (36.7 C)  TempSrc:    Oral  SpO2:   98%   Weight:      Height:       Physical Exam Vitals and nursing note reviewed.  Constitutional:      General: Suzanne Dixon is not in acute distress.    Appearance: Suzanne Dixon is obese.  HENT:     Head: Normocephalic and atraumatic.     Mouth/Throat:     Mouth: Mucous membranes are moist.     Pharynx: Oropharynx is clear.  Cardiovascular:     Rate and  Rhythm: Normal rate and regular rhythm.     Heart  sounds: No murmur heard. Pulmonary:     Effort: Pulmonary effort is normal. No respiratory distress.     Breath sounds: Normal breath sounds.  Abdominal:     General: Bowel sounds are normal. There is no distension.     Palpations: Abdomen is soft.     Tenderness: There is no abdominal tenderness. There is no guarding.  Musculoskeletal:     Cervical back: Normal.     Thoracic back: Normal.     Lumbar back: Tenderness and bony tenderness present. No swelling, deformity or signs of trauma. Normal range of motion.     Right lower leg: No edema.     Left lower leg: No edema.  Skin:    General: Skin is warm and dry.  Neurological:     General: No focal deficit present.     Mental Status: Suzanne Dixon is alert.     Comments: 4/5 strength of bilateral lower extremities  Psychiatric:        Mood and Affect: Mood normal.        Behavior: Behavior normal.    Data Reviewed: CBC with WBC of 5.3, hemoglobin of 13.5, platelets of 235 BMP with sodium of 135, potassium 3.5, bicarb 29, BUN 15, creatinine 0.65 with GFR above 60. CK 68 Urinalysis with hematuria, ketonuria, leukocytes, and increased RBCs and WBCs/hpf.  EKG personally reviewed.  Sinus rhythm with rate of 59.  No ST or T wave changes concerning for acute ischemia.  Normal QTc.  CT L-SPINE NO CHARGE  Result Date: 06/29/2023 CLINICAL DATA:  72 year old female with back pain, increasing. Recurrent UTI. EXAM: CT LUMBAR SPINE WITHOUT CONTRAST TECHNIQUE: Multidetector CT imaging of the lumbar spine was performed without intravenous contrast administration. Multiplanar CT image reconstructions were also generated. RADIATION DOSE REDUCTION: This exam was performed according to the departmental dose-optimization program which includes automated exposure control, adjustment of the mA and/or kV according to patient size and/or use of iterative reconstruction technique. COMPARISON:  CT Abdomen and Pelvis 06/21/2023 and earlier. FINDINGS: Segmentation:  Normal. Alignment: Stable lumbar lordosis. No significant scoliosis or spondylolisthesis. Vertebrae: Diffuse idiopathic skeletal hyperostosis (DISH). Flowing lower thoracic endplate osteophytes result interbody in ankylosis through the T11 level. Degenerative appearing bilateral facet ankylosis superimposed on bulky facet and endplate hypertrophy results in ankylosis from L4 through to the sacrum. Maintained vertebral body height and No acute osseous abnormality identified. Pronounced degenerative SI joint vacuum phenomena, but visible sacrum and SI joints appear intact. Paraspinal and other soft tissues: Stable from the recent CT Abdomen and Pelvis, including bilateral nephrolithiasis but no hydronephrosis or hydroureter. Disc levels: Lower thoracic and lumbar spine degeneration superimposed on Diffuse idiopathic skeletal hyperostosis. Most notable levels include: T11-T12: Vacuum disc. Circumferential disc osteophyte complex and mild to moderate facet hypertrophy. At least mild spinal stenosis suspected here. L2-L3: Vacuum disc with bulky circumferential disc osteophyte complex, severe facet and ligament flavum hypertrophy. Evidence of severe spinal and moderate to severe bilateral L2 foraminal stenosis. L3-L4: Vacuum disc with severe disc space loss. Bulky circumferential disc osteophyte complex. Severe bilateral facet hypertrophy, evidence of developing facet ankylosis (series 9, image 71). Severe spinal and moderate to severe bilateral L3 foraminal stenosis. The levels below L3 are ankylosed. IMPRESSION: 1. No acute osseous abnormality identified. Diffuse idiopathic skeletal hyperostosis (DISH) with severe superimposed lumbar facet arthropathy, multilevel advanced disc degeneration. 2. Subsequent Ankylosis from the visible lower thoracic levels through T11, and L4 through the sacrum.  Evidence of developing ankylosis also at L3-L4. 3. Severe multifactorial lumbar spinal stenosis at both L2-L3 and L3-L4, with up  to severe biforaminal stenosis at both levels. And suspect significant lower thoracic spinal stenosis at T11-T12. Electronically Signed   By: Odessa Fleming M.D.   On: 06/29/2023 11:30    Results are pending, will review when available.  Assessment and Plan:  * Acute back pain Patient reporting back pain since having a CT scan on 10/02 where Suzanne Dixon believes Suzanne Dixon may have pulled a muscle.  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  Like acute exacerbation with low suspicion for spinal process. MRI ordered in the ED and pending.   Will trial low-dose muscle relaxers with Tylenol. If ineffective, can try low dose opioid.  Given patient lives alone, high fall risk and required 2 person assist in the ED to stand up, will need to be admitted for PT/OT evaluation.  - MRI pending - PT/OT - Robaxin 500 mg 3 times daily as needed - Tylenol 650 mg 4 times daily scheduled - Norco 3 times daily for severe pain not relieved by regimen above - Continue home Meloxicam  Acute cystitis Patient has a history of chronic UTIs and has been on numerous rounds over the last few months. Most recent culture from 06/10/2023 demonstrated Klebsiella pneumoniae with resistant to Ampicillin, Bactrim, and Macrobid. Suzanne Dixon has been on Cefdinir since 10/07 with improvement in symptoms.  Suzanne Dixon has already been transition to IV in the ED and will continue while Suzanne Dixon is admitted for now.  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  No evidence of osseous lesion seen on CT imaging.  Apnea, sleep - CPAP at  bedtime  Hypertension - Continue home regimen  Advance Care Planning:   Code Status: Full Code verified by patient  Consults: None  Family Communication: No family at bedside.   Severity of Illness: The appropriate patient status for this patient is OBSERVATION. Observation status is judged to be reasonable and necessary in order to provide the required intensity of service to ensure the patient's safety. The patient's presenting symptoms, physical exam findings, and initial radiographic and laboratory data in the context of their medical condition is felt to place them at decreased risk for further clinical deterioration. Furthermore, it is anticipated that the patient will be medically stable for discharge from the hospital within 2 midnights of admission.   Author: Verdene Lennert, MD 06/29/2023 3:04 PM  For on call review [email or web address].

## 2023-06-30 DIAGNOSIS — M545 Low back pain, unspecified: Secondary | ICD-10-CM | POA: Diagnosis not present

## 2023-06-30 LAB — URINE CULTURE: Culture: NO GROWTH

## 2023-06-30 LAB — CBC
HCT: 38 % (ref 36.0–46.0)
Hemoglobin: 12.7 g/dL (ref 12.0–15.0)
MCH: 30.7 pg (ref 26.0–34.0)
MCHC: 33.4 g/dL (ref 30.0–36.0)
MCV: 91.8 fL (ref 80.0–100.0)
Platelets: 222 10*3/uL (ref 150–400)
RBC: 4.14 MIL/uL (ref 3.87–5.11)
RDW: 13.8 % (ref 11.5–15.5)
WBC: 5.6 10*3/uL (ref 4.0–10.5)
nRBC: 0 % (ref 0.0–0.2)

## 2023-06-30 LAB — PROTIME-INR
INR: 3.1 — ABNORMAL HIGH (ref 0.8–1.2)
Prothrombin Time: 32.4 s — ABNORMAL HIGH (ref 11.4–15.2)

## 2023-06-30 MED ORDER — WARFARIN SODIUM 1 MG PO TABS
1.0000 mg | ORAL_TABLET | Freq: Once | ORAL | Status: AC
  Administered 2023-06-30: 1 mg via ORAL
  Filled 2023-06-30: qty 1

## 2023-06-30 NOTE — Progress Notes (Signed)
Progress Note   Patient: Suzanne Dixon DOB: 30-Jan-1951 DOA: 06/29/2023     0 DOS: the patient was seen and examined on 06/30/2023   Brief hospital course:   Suzanne Dixon is a 72 y.o. female with medical history significant of recurrent UTI, Parkinson's disease, hypertension, OSA on CPAP, atrial fibrillation on Warfarin, lymphedema, degenerative disc disease, breast cancer s/p chemo mastectomy and radiation, hypothyroidism, neuropathy, gout, who presents to the ED due to back pain. Suzanne Dixon states that she has been experiencing recurrent UTIs for the last few months.  She is currently on her third course of antibiotics that she started on the evening of 10/07.  She notes that her dysuria has resolved but she still experiencing some urgency although it is much improved.  She denies any persistent abdominal pain, stating she occasionally experiences some right-sided abdominal pain. Suzanne Dixon states what actually brought her in today was her back pain.  She states that when she climbed up on the CT scanner on 10/02, she had sudden sharp pain in her back that radiated bilaterally around to her groin and down her legs.  The pain has persisted since then, however in the last 24 hours, her pain seemed to have worsened.  When she tries to stand up, she feels weak and as though she is going to fall.  Due to this, she stated in her wheelchair all night.  She notes that she lives alone and is quite worried about suffering a fall.  She denies any other back pain or leg pain.  She denies any urinary or fecal incontinence.  No paresthesias.    Patient states she uses a wheelchair at home but was able to take some steps on her own before.    ED course: On arrival to the ED, patient was hypertensive at 156/98 with heart rate of 68.  She was saturating at 100% on room air.  She was afebrile at 97.7.Initial workup notable for normal CBC, BMP, CK.  Urinalysis with hematuria, ketonuria, leukocytes,  and increased WBC/hpf.  CT renal stone study was obtained with no acute findings within the abdomen or pelvis with bilateral nonobstructive kidney stones.  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  No acute findings.  Patient started on ceftriaxone  MRI showed : Multilevel lumbar spondylosis, worst at L3-4, where there is severe spinal canal stenosis and moderate bilateral neural foraminal narrowing. Moderate spinal canal stenosis at T11-12 and L2-3. Moderate to severe neural foraminal narrowing at multiple additional levels, as detailed above.  Assessment and Plan: * Acute back pain Patient reporting back pain since having a CT scan on 10/02 where she believes she may have pulled a muscle.  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  Like acute exacerbation with low suspicion for spinal process. MRI ordered in the ED and pending.   Will trial low-dose muscle relaxers with Tylenol. If ineffective, can try low dose opioid.  Given patient lives alone, high fall risk and required 2 person assist in the ED to stand up, will need to be admitted for PT/OT evaluation.  - PT/OT -recommends rehab - Robaxin 500 mg 3 times daily as needed - Tylenol 650 mg 4 times daily scheduled - Norco 3 times daily for severe pain not relieved by regimen above - Continue home Meloxicam  Acute cystitis Patient has a history of chronic UTIs and has been on  numerous rounds over the last few months. Most recent culture from 06/10/2023 demonstrated Klebsiella pneumoniae with resistant to Ampicillin, Bactrim, and Macrobid. She has been on Cefdinir since 10/07 with improvement in symptoms.  She has already been transition to IV in the ED and will continue while she is admitted for now.  - Continue IV ceftriaxone while admitted - Await urine culture data obtained on 10/7 at PCPs office  AF  (paroxysmal atrial fibrillation)  - Decrease home Metoprolol given bradycardia - Warfarin per pharmacy dosing  Parkinsonism (HCC) - Continue home regimen  Hypothyroidism - Continue home regimen  Large granular lymphocytic leukemia  Follows with oncology, currently under surveillance only.  No evidence of osseous lesion seen on CT imaging.  Apnea, sleep - CPAP at bedtime  Hypertension - Continue home regimen      Subjective: Patient seen and examined this morning.  Continues to complain of back pain.  No overnight events.  Vital labs and imaging reviewed.  Patient pending neurosurgery eval.  Physical Exam: Vitals:   06/29/23 1600 06/29/23 2052 06/30/23 0412 06/30/23 0740  BP: (!) 155/93 116/62 (!) 107/56 (!) 140/81  Pulse: 77 77 68 66  Resp: 19 20 20 17   Temp: 97.8 F (36.6 C) 98.1 F (36.7 C) 97.8 F (36.6 C) 98.1 F (36.7 C)  TempSrc: Oral     SpO2: 100% 98% 100% 100%  Weight:      Height:       Physical Exam Constitutional:      Appearance: Normal appearance.  HENT:     Right Ear: Tympanic membrane normal.     Nose: Nose normal.  Eyes:     Extraocular Movements: Extraocular movements intact.     Pupils: Pupils are equal, round, and reactive to light.  Cardiovascular:     Rate and Rhythm: Normal rate.  Pulmonary:     Effort: Pulmonary effort is normal.  Abdominal:     Palpations: Abdomen is soft.  Musculoskeletal:     Cervical back: Normal range of motion.     Comments: Low back tenderness bilaterally  Skin:    General: Skin is warm.  Neurological:     General: No focal deficit present.     Mental Status: She is alert and oriented to person, place, and time.  Psychiatric:        Mood and Affect: Mood normal.     Data Reviewed:  There are no new results to review at this time.  Family Communication: None by bedside  Disposition: Status is: Observation The patient remains OBS appropriate and will d/c before 2 midnights.  Planned Discharge  Destination: Skilled nursing facility    Time spent: 35 minutes   Author: Kirstie Peri, MD 06/30/2023 2:22 PM  For on call review [email or web address].

## 2023-06-30 NOTE — NC FL2 (Signed)
[place] MEDICAID FL2 LEVEL OF CARE FORM     IDENTIFICATION  Patient Name: Suzanne Dixon Birthdate: [birth year] Sex: female Admission Date (Current Location): 06/29/2023  [facility] and [place] Number:  Chiropodist and Address:  [facility], [address], [place], [place] [address]      Provider Number: [id number]  Attending Physician Name and Address:  Kirstie Peri, MD  Relative Name and Phone Number:       Current Level of Care: Hospital Recommended Level of Care: Skilled Nursing Facility Prior Approval Number:    Date Approved/Denied:   PASRR Number: Manual review  Discharge Plan: SNF    Current Diagnoses: Patient Active Problem List   Diagnosis Date Noted   Acute cystitis 06/29/2023   Acute back pain 06/29/2023   Pressure injury of skin 06/20/2022   Sepsis secondary to UTI (HCC) 06/19/2022   AMS (altered mental status) 06/19/2022   Parkinsonism (HCC) 05/30/2022   Acute metabolic encephalopathy 05/20/2022   Thoracic degenerative disc disease 08/24/2021   Aortic atherosclerosis (HCC) 08/23/2021   COVID-19 virus infection 06/19/2021   CVA (cerebral vascular accident) (HCC) 02/01/2021   Hypothyroidism    Left leg cellulitis    Acquired thrombophilia (HCC) 12/10/2020   Status post reverse total shoulder replacement, 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   Lymphedema 07/17/2020   Right rotator cuff tear arthropathy 04/07/2020   Edema of both legs 12/31/2019   Polyneuropathy associated with underlying disease (HCC) 12/31/2019   Swelling of limb 12/31/2019   Hematuria, microscopic 06/07/2018   Age-related osteoporosis without current pathological fracture 12/08/2017   Rotator cuff arthropathy, right 01/03/2017   Biceps tendinitis of right upper extremity 12/05/2016   Complete tear of right rotator cuff 12/05/2016   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   History of breast cancer  in female 06/02/2016   Allergy 04/01/2015   Arthritis 04/01/2015   Cancer (HCC) 04/01/2015   Edema leg 04/01/2015   Gout 04/01/2015   Hidradenitis 04/01/2015   Elevated lymphocyte count 04/01/2015   Disorder of peripheral nervous system 04/01/2015   Apnea, sleep 04/01/2015   Avitaminosis D 04/01/2015   Breast cancer (HCC) 04/01/2015   Hypertension 04/15/2014   B12 deficiency 02/24/2014   AF (paroxysmal atrial fibrillation) (HCC) 02/17/2014   Adiposity 06/04/2012   Depression, major, recurrent, moderate (HCC) 03/26/2012    Orientation RESPIRATION BLADDER Height & Weight     Self, Time, Situation, Place  Normal Incontinent (At times) Weight: 233 lb 11 oz (106 kg) Height:  5\' 4"  (162.6 cm)  BEHAVIORAL SYMPTOMS/MOOD NEUROLOGICAL BOWEL NUTRITION STATUS   (None)  (Parkinsons) Continent Diet (Regular)  AMBULATORY STATUS COMMUNICATION OF NEEDS Skin   Extensive Assist Verbally Skin abrasions, Other (Comment) (Erythema/redness. Skin tear on sacrum: Foam. Skin tears x 2 on right hip (no dressing listed). Skin tear on left pelvis (no dressing listed).)                       Personal Care Assistance Level of Assistance  Bathing, Feeding, Dressing Bathing Assistance: Maximum assistance Feeding assistance: Limited assistance Dressing Assistance: Maximum assistance     Functional Limitations Info  Sight, Speech, Hearing Sight Info: Adequate Hearing Info: Adequate Speech Info: Adequate    SPECIAL CARE FACTORS FREQUENCY  PT (By licensed PT), OT (By licensed OT)     PT Frequency: 5 x week OT Frequency: 5 x week  Contractures Contractures Info: Not present    Additional Factors Info  Code Status, Allergies Code Status Info: Full code Allergies Info: Advair Diskus (Fluticasone-salmeterol), Alendronate, Singulair (Montelukast), Sulfa Antibiotics, Venlafaxine           Current Medications (06/30/2023):  This is the current hospital active medication  list Current Facility-Administered Medications  Medication Dose Route Frequency Provider Last Rate Last Admin   acetaminophen (TYLENOL) tablet 650 mg  650 mg Oral Q6H Verdene Lennert, MD   650 mg at 06/30/23 0848   allopurinol (ZYLOPRIM) tablet 300 mg  300 mg Oral Daily Verdene Lennert, MD   300 mg at 06/30/23 1138   calcium-vitamin D (OSCAL WITH D) 500-5 MG-MCG per tablet   Oral BID WC Verdene Lennert, MD   1 tablet at 06/30/23 0847   carbidopa-levodopa (SINEMET IR) 25-100 MG per tablet immediate release 2 tablet  2 tablet Oral TID Verdene Lennert, MD   2 tablet at 06/30/23 0847   cefTRIAXone (ROCEPHIN) 1 g in sodium chloride 0.9 % 100 mL IVPB  1 g Intravenous Q24H Verdene Lennert, MD       cyanocobalamin (VITAMIN B12) tablet 1,000 mcg  1,000 mcg Oral Daily Verdene Lennert, MD   1,000 mcg at 06/30/23 0848   DULoxetine (CYMBALTA) DR capsule 60 mg  60 mg Oral BID Verdene Lennert, MD   60 mg at 06/30/23 0849   feeding supplement (ENSURE ENLIVE / ENSURE PLUS) liquid 237 mL  237 mL Oral BID BM Verdene Lennert, MD   237 mL at 06/29/23 1712   gabapentin (NEURONTIN) capsule 300 mg  300 mg Oral QID Verdene Lennert, MD   300 mg at 06/30/23 0847   HYDROcodone-acetaminophen (NORCO/VICODIN) 5-325 MG per tablet 1 tablet  1 tablet Oral Q6H PRN Verdene Lennert, MD       levothyroxine (SYNTHROID) tablet 50 mcg  50 mcg Oral QAC breakfast Verdene Lennert, MD   50 mcg at 06/30/23 0515   lidocaine (LIDODERM) 5 % 1 patch  1 patch Transdermal Q24H Chesley Noon, MD   1 patch at 06/30/23 0850   magnesium oxide (MAG-OX) tablet 400 mg  400 mg Oral Daily Verdene Lennert, MD   400 mg at 06/30/23 0848   meloxicam (MOBIC) tablet 15 mg  15 mg Oral Daily Verdene Lennert, MD   15 mg at 06/30/23 0850   methocarbamol (ROBAXIN) tablet 500 mg  500 mg Oral Q6H PRN Verdene Lennert, MD   500 mg at 06/29/23 1937   metoprolol tartrate (LOPRESSOR) tablet 25 mg  25 mg Oral BID Verdene Lennert, MD   25 mg at 06/30/23 0848   ondansetron  (ZOFRAN) tablet 4 mg  4 mg Oral Q6H PRN Verdene Lennert, MD       Or   ondansetron (ZOFRAN) injection 4 mg  4 mg Intravenous Q6H PRN Verdene Lennert, MD       polyethylene glycol (MIRALAX / GLYCOLAX) packet 17 g  17 g Oral Daily PRN Verdene Lennert, MD       warfarin (COUMADIN) tablet 1 mg  1 mg Oral ONCE-1600 Hunt, [facility] H, [facility]       Warfarin - Pharmacist Dosing Inpatient   Does not apply [id number] Verdene Lennert, MD         Discharge Medications: Please see discharge summary for a list of discharge medications.  Relevant Imaging Results:  Relevant Lab Results:   Additional Information SS#: [id number]  Margarito Liner, LCSW

## 2023-06-30 NOTE — Evaluation (Signed)
Physical Therapy Evaluation Patient Details Name: Suzanne Dixon MRN: [id number] DOB: [birth year] Today's Date: 06/30/2023  History of Present Illness  72 y/o female presented to ED on 06/29/23 for back pain. MRI showed multilevel lumbar spondylosis (worst at L3-4) with severe spinal canal stenosis and moderate bilateral neural foraminal. PMH: paroxysmal atrial fibrillation, hypertension, prior CVA, obesity, hx of breast cancer s/p chemo, mastectomy,and radiation, neuropathy, lymphedema, parkinson's disease  Clinical Impression  Patient admitted with the above. PTA, patient lives alone but has aide 3 days/week for 4 hours each day. Patient reports using RW and w/c for mobility depending on the day and where she is going. Patient presents with weakness, impaired balance, decreased activity tolerance, and pain. Required minA for supine to sit with log roll technique. Attempted standing x 2 requiring modA+2 and unable to maintain standing in crouched posture >15 seconds due to back pain and burning sensation in B hips. Required maxA to return to supine. Patient will benefit from skilled PT services during acute stay to address listed deficits. Patient will benefit from ongoing therapy at discharge to maximize functional independence and safety.       If plan is discharge home, recommend the following: A lot of help with walking and/or transfers;A lot of help with bathing/dressing/bathroom;Assistance with cooking/housework;Assist for transportation;Help with stairs or ramp for entrance   Can travel by private vehicle   No    Equipment Recommendations Rolling Alexya Mcdaris (2 wheels);BSC/3in1  Recommendations for Other Services       Functional Status Assessment Patient has had a recent decline in their functional status and demonstrates the ability to make significant improvements in function in a reasonable and predictable amount of time.     Precautions / Restrictions Precautions Precautions:  Fall Restrictions Weight Bearing Restrictions: No      Mobility  Bed Mobility Overal bed mobility: Needs Assistance Bed Mobility: Rolling, Supine to Sit, Sit to Supine Rolling: Min assist, Used rails   Supine to sit: Min assist, HOB elevated Sit to supine: Max assist   General bed mobility comments: Min verbal cues for hand placement when rolling,    Transfers Overall transfer level: Needs assistance Equipment used: Rolling Kariya Lavergne (2 wheels) Transfers: Sit to/from Stand Sit to Stand: Mod assist, +2 physical assistance, From elevated surface           General transfer comment: unable to tolerate standing >15 seconds due to pain and burning sensation. Attempted x 2    Ambulation/Gait                  Stairs            Wheelchair Mobility     Tilt Bed    Modified Rankin (Stroke Patients Only)       Balance Overall balance assessment: History of Falls, Needs assistance Sitting-balance support: Feet supported, No upper extremity supported Sitting balance-Leahy Scale: Good Sitting balance - Comments: Slight posterior lean Postural control: Posterior lean Standing balance support: Bilateral upper extremity supported, Reliant on assistive device for balance, During functional activity Standing balance-Leahy Scale: Fair Standing balance comment: Will continue to asses standing balance                             Pertinent Vitals/Pain Pain Assessment Pain Assessment: 0-10 Pain Score: 3  Pain Location: Bil hips Pain Descriptors / Indicators: Burning, Grimacing Pain Intervention(s): Monitored during session, Repositioned, Limited activity within patient's tolerance    Home  Living Family/patient expects to be discharged to:: Private residence Living Arrangements: Alone Available Help at Discharge: Family;Available PRN/intermittently;Personal care attendant Type of Home: House Home Access: Ramped entrance       Home Layout: One  level Home Equipment: Agricultural consultant (2 wheels);Cane - single point;Wheelchair - manual;Shower seat      Prior Function Prior Level of Function : Needs assist             Mobility Comments: ambulates with RW or uses W/c ADLs Comments: Pt. has home health 4 hours x 3 days a week to do laundry, grocery shopping, cooking, cleaning, helps with dressing, bathing.     Extremity/Trunk Assessment   Upper Extremity Assessment Upper Extremity Assessment: Generalized weakness    Lower Extremity Assessment Lower Extremity Assessment: Generalized weakness    Cervical / Trunk Assessment Cervical / Trunk Assessment: Kyphotic  Communication   Communication Communication: No apparent difficulties Cueing Techniques: Tactile cues;Verbal cues  Cognition Arousal: Alert Behavior During Therapy: WFL for tasks assessed/performed Overall Cognitive Status: Within Functional Limits for tasks assessed                                          General Comments      Exercises     Assessment/Plan    PT Assessment Patient needs continued PT services  PT Problem List Decreased strength;Decreased activity tolerance;Decreased balance;Decreased mobility;Decreased safety awareness;Decreased knowledge of precautions;Pain       PT Treatment Interventions Gait training;DME instruction;Functional mobility training;Therapeutic activities;Therapeutic exercise;Balance training;Patient/family education    PT Goals (Current goals can be found in the Care Plan section)  Acute Rehab PT Goals Patient Stated Goal: to get back to being independent PT Goal Formulation: With patient Time For Goal Achievement: 07/14/23 Potential to Achieve Goals: Fair    Frequency Min 1X/week     Co-evaluation PT/OT/SLP Co-Evaluation/Treatment: Yes Reason for Co-Treatment: To address functional/ADL transfers;Complexity of the patient's impairments (multi-system involvement) PT goals addressed during  session: Mobility/safety with mobility OT goals addressed during session: ADL's and self-care;Proper use of Adaptive equipment and DME       AM-PAC PT "6 Clicks" Mobility  Outcome Measure Help needed turning from your back to your side while in a flat bed without using bedrails?: A Little Help needed moving from lying on your back to sitting on the side of a flat bed without using bedrails?: A Little Help needed moving to and from a bed to a chair (including a wheelchair)?: Total Help needed standing up from a chair using your arms (e.g., wheelchair or bedside chair)?: Total Help needed to walk in hospital room?: Total Help needed climbing 3-5 steps with a railing? : Total 6 Click Score: 10    End of Session Equipment Utilized During Treatment: Gait belt Activity Tolerance: Patient limited by pain Patient left: in bed;with call bell/phone within reach;with bed alarm set Nurse Communication: Mobility status PT Visit Diagnosis: Unsteadiness on feet (R26.81);Muscle weakness (generalized) (M62.81);Difficulty in walking, not elsewhere classified (R26.2);Pain    Time: 6578-4696 PT Time Calculation (min) (ACUTE ONLY): 30 min   Charges:   PT Evaluation $PT Eval Moderate Complexity: 1 Mod   PT General Charges $$ ACUTE PT VISIT: 1 Visit         Maylon Peppers, PT, DPT Physical Therapist - [facility]  [facility]   Antonieta Slaven A Antonio Creswell 06/30/2023, 12:31 PM

## 2023-06-30 NOTE — TOC CM/SW Note (Signed)
RE: Suzanne Dixon Date of Birth: September 13, 1951 Date: 06/30/2023   To Whom It May Concern:  Please be advised that the above-named patient will require a short-term nursing home stay - anticipated 30 days or less for rehabilitation and strengthening.  The plan is for return home.

## 2023-06-30 NOTE — Consult Note (Signed)
Pharmacy Consult Note - Anticoagulation  Pharmacy Consult for warfarin Indication: atrial fibrillation  PATIENT MEASUREMENTS: Height: 5\' 4"  (162.6 cm) Weight: 106 kg (233 lb 11 oz) IBW/kg (Calculated) : 54.7 HEPARIN DW (KG): 79.7  VITAL SIGNS: Temp: 97.8 F (36.6 C) (10/11 0412) BP: 107/56 (10/11 0412) Pulse Rate: 68 (10/11 0412)  Recent Labs    06/29/23 1023 06/29/23 2018 06/29/23 2018 06/30/23 0445  HGB 13.5  --   --  12.7  HCT 39.2  --   --  38.0  PLT 235  --   --  222  APTT  --  37*  --   --   LABPROT  --  33.0*   < > 32.4*  INR  --  3.2*   < > 3.1*  CREATININE 0.65  --   --   --   CKTOTAL 68  --   --   --    < > = values in this interval not displayed.    Estimated Creatinine Clearance: 73.2 mL/min (by C-G formula based on SCr of 0.65 mg/dL).  PAST MEDICAL HISTORY: Past Medical History:  Diagnosis Date   A-fib (HCC)    Anemia    B12 deficiency    Breast cancer (HCC) 03/31/2013   Right - chemo- mastectomy   Breast cancer (HCC) 1991   Rt.- radiation   Complication of anesthesia 01/2009   Recent years with general anesthesia had itching following surgery   DDD (degenerative disc disease), cervical    Depression    Diverticulitis    Dyspnea    Edema of both legs    GERD (gastroesophageal reflux disease)    Gout    H/O mastectomy 2014   per patient   H/O: cesarean section 1987   per patient report   H/O: hysterectomy 1996   per patient   Hydradenitis    Hypertension    Hypothyroidism    Neuropathy    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HCC)    Parkinson's disease (HCC)    Personal history of chemotherapy    Personal history of radiation therapy    Ruptured cervical disc 2002   per patient    Sleep apnea    cpap machine   Super obese    Tachycardia     ASSESSMENT: 72 y.o. female with PMH Afib (on warfarin), HTN, breast cancer s/p mastectomy and chemoradiation, recurrent UTIs is presenting with acute lower back pain. Patient is on chronic  anticoagulation per chart review. Pharmacy has been consulted to initiate and manage warfarin.  Pertinent medications: Home warfarin regimen  Warfarin 6 mg PO once daily on Tue, Wed, Thu, Sat, Sun  Warfarin 3 mg PO once daily on Mon and Fri Last warfarin dose PTA was evening of 10/9 Patient has 6 mg and 1 mg tablets at home  Goal(s) of therapy: INR 2 - 3 Monitor platelets by anticoagulation protocol: Yes   Baseline anticoagulation labs: Recent Labs    06/29/23 1023 06/29/23 2018 06/30/23 0445  APTT  --  37*  --   INR  --  3.2* 3.1*  HGB 13.5  --  12.7  PLT 235  --  222   Date INR Warfarin Dose  10/10 3.2 HELD  10/11 3.1 1 mg (ordered)     PLAN: INR remains slightly above goal today, but decreasing. Patient remains of abx for UTI. Will give warfarin 1 mg x 1 tonight to prevent patient from decreasing below goal. Re-assess with AM labs.  [place],  PharmD Clinical Pharmacist 06/30/2023 7:24 AM

## 2023-06-30 NOTE — TOC Progression Note (Signed)
Transition of Care [facility]) - Progression Note    Patient Details  Name: Suzanne Dixon MRN: [id number] Date of Birth: 06/05/1951  Transition of Care [facility]) CM/SW Contact  Garret Reddish, RN Phone Number: 06/30/2023, 6:31 PM  Clinical Narrative:     Attempted to speak with Mrs. Bonito several times today regarding SNF.  She has requested that I speak with her at a later time.    TOC Social worker has completed FL2 for possible SNF placement.    TOC will continue to follow for discharge planning.         Expected Discharge Plan and Services                                               Social Determinants of Health (SDOH) Interventions SDOH Screenings   Food Insecurity: Food Insecurity Present (06/29/2023)  Housing: Low Risk  (06/29/2023)  Transportation Needs: No Transportation Needs (06/29/2023)  Recent Concern: Transportation Needs - Unmet Transportation Needs (05/02/2023)   Received from [facility]  Utilities: Not At Risk (06/29/2023)  Financial Resource Strain: Low Risk  (06/09/2023)   Received from [facility]  Recent Concern: Financial Resource Strain - High Risk (05/02/2023)   Received from [facility]  Physical Activity: Insufficiently Active (05/02/2023)   Received from [facility]  Social Connections: Moderately Isolated (05/02/2023)   Received from [facility]  Stress: Stress Concern Present (05/02/2023)   Received from [facility]  Tobacco Use: Low Risk  (06/29/2023)  Health Literacy: Inadequate Health Literacy (05/02/2023)   Received from [facility]    Readmission Risk Interventions    06/21/2021    4:12 PM  Readmission Risk Prevention Plan  Transportation Screening Complete  PCP or Specialist Appt within 3-5 Days Complete  HRI or Home Care Consult Complete  Social Work Consult for Recovery Care Planning/Counseling  Complete  Palliative Care Screening Not Applicable  Medication Review Oceanographer) Complete

## 2023-06-30 NOTE — Plan of Care (Signed)
  Problem: Clinical Measurements: Goal: Will remain free from infection Outcome: Progressing Goal: Diagnostic test results will improve Outcome: Progressing   Problem: Nutrition: Goal: Adequate nutrition will be maintained Outcome: Progressing   Problem: Coping: Goal: Level of anxiety will decrease Outcome: Progressing   Problem: Elimination: Goal: Will not experience complications related to bowel motility Outcome: Progressing   Problem: Pain Managment: Goal: General experience of comfort will improve Outcome: Progressing

## 2023-06-30 NOTE — Evaluation (Signed)
Occupational Therapy Evaluation Patient Details Name: Suzanne Dixon MRN: [id number] DOB: 1951/02/26 Today's Date: 06/30/2023   History of Present Illness 72 y/o female presented to ED on 06/29/23 for back pain. MRI showed multilevel lumbar spondylosis (worst at L3-4) with severe spinal canal stenosis and moderate bilateral neural foraminal. PMH: paroxysmal atrial fibrillation, hypertension, prior CVA, obesity, hx of breast cancer s/p chemo, mastectomy,and radiation, neuropathy, lymphedema, parkinson's disease   Clinical Impression   Pt was seen for a co-evaluation with OT and PT on this date. PTA pt reports generally MOD I with ADL, assist for IADL, has an aid  4 hrs 3 days a week, amb with RW or use of wheelchair. Pt lives alone in house with ramped entrance. On arrival to room Pt starting breakfast in seated position in bed with HOB elevated reporting no difficulties with feeding herself. Pt presents to acute OT demonstrating impaired ADL performance and functional mobility (See OT problem list for additional functional deficits). MIN A required for supine>sit, MAX A for sit>supine for management of BLE. Pt attempted STS from EOB x2, requiring MOD A +2, limited by pain. Noted pt hip forward flexion, unable to fully stand due to burning hip pain in bilateral hips. MAX A required for peri care.  CGA for lateral scoot up the bed.  Pt would benefit from skilled OT services to address noted impairments and functional limitations (see below for any additional details) in order to maximize safety and independence while minimizing falls risk and caregiver burden. OT will follow acutely.     If plan is discharge home, recommend the following: A lot of help with bathing/dressing/bathroom;Assistance with cooking/housework;A lot of help with walking and/or transfers;Help with stairs or ramp for entrance    Functional Status Assessment  Patient has had a recent decline in their functional status and  demonstrates the ability to make significant improvements in function in a reasonable and predictable amount of time.  Equipment Recommendations  BSC/3in1    Recommendations for Other Services       Precautions / Restrictions Precautions Precautions: Fall Restrictions Weight Bearing Restrictions: No      Mobility Bed Mobility Overal bed mobility: Needs Assistance Bed Mobility: Rolling, Supine to Sit, Sit to Supine Rolling: Min assist, Used rails   Supine to sit: Min assist, HOB elevated (modified technique) Sit to supine: Max assist   General bed mobility comments: Min verbal cues for hand placement when rolling,    Transfers Overall transfer level: Needs assistance Equipment used: Rolling walker (2 wheels) Transfers: Sit to/from Stand Sit to Stand: Mod assist, +2 physical assistance, From elevated surface  Lateral scoot up the bed to the left with CGA +1         General transfer comment: Unable to tolerate standing on this date due to burning pain in hips, 2 attempts      Balance Overall balance assessment: History of Falls, Needs assistance Sitting-balance support: Feet supported, No upper extremity supported Sitting balance-Leahy Scale: Good Sitting balance - Comments: Slight posterior lean Postural control: Posterior lean Standing balance support: Bilateral upper extremity supported, Reliant on assistive device for balance, During functional activity Standing balance-Leahy Scale: Fair Standing balance comment: Will continue to asses standing balance                           ADL either performed or assessed with clinical judgement   ADL Overall ADL's : Needs assistance/impaired Eating/Feeding: Supervision/ safety Eating/Feeding Details (indicate  cue type and reason): Eating breakfast when arrived to Pt room Grooming: Wash/dry face;Sitting;Supervision/safety               Lower Body Dressing: Maximal assistance;Bed level Lower Body  Dressing Details (indicate cue type and reason): socks             Functional mobility during ADLs: Moderate assistance;+2 for physical assistance;Rolling walker (2 wheels) General ADL Comments: x2 Attempted standing with MOD A+ 2 for physical A, pt unable to tolerate on this date due to burning pain in hips.     Vision Baseline Vision/History: 1 Wears glasses       Perception         Praxis         Pertinent Vitals/Pain Pain Assessment Pain Assessment: 0-10 Pain Score: 3  Pain Location: Bil hips Pain Descriptors / Indicators: Burning, Grimacing Pain Intervention(s): Limited activity within patient's tolerance, Monitored during session, RN gave pain meds during session     Extremity/Trunk Assessment Upper Extremity Assessment Upper Extremity Assessment: Generalized weakness  Noted pill rolling tremor throughout R hand  Lower Extremity Assessment Lower Extremity Assessment: Generalized weakness   Cervical / Trunk Assessment Cervical / Trunk Assessment: Normal   Communication Communication Communication: No apparent difficulties Cueing Techniques: Tactile cues;Verbal cues   Cognition Arousal: Alert Behavior During Therapy: WFL for tasks assessed/performed Overall Cognitive Status: Within Functional Limits for tasks assessed                                       General Comments       Exercises Other Exercises Other Exercises: Edu: Role of OT, role of rehab, safe ADL completion, DME management/hand placement for t/f   Shoulder Instructions      Home Living Family/patient expects to be discharged to:: Private residence Living Arrangements: Alone Available Help at Discharge: Family;Available PRN/intermittently;Personal care attendant 4 hrs per day  Type of Home: House Home Access: Ramped entrance     Home Layout: One level     Bathroom Shower/Tub: Producer, television/film/video: Handicapped height     Home Equipment: Clinical biochemist (2 wheels);Cane - single point;Wheelchair - manual;Shower seat          Prior Functioning/Environment Prior Level of Function : Needs assist             Mobility Comments: ambulates with RW or uses W/C household/community distances; pt reports she was driving until recently  ADLs Comments: Pt. has home health 4 hours x 3 days a week to do laundry, grocery shopping, cooking, cleaning, helps with dressing, bathing.        OT Problem List: Decreased strength;Decreased coordination;Pain;Impaired balance (sitting and/or standing);Decreased safety awareness      OT Treatment/Interventions: Self-care/ADL training;Therapeutic activities;Energy conservation;DME and/or AE instruction;Balance training    OT Goals(Current goals can be found in the care plan section) Acute Rehab OT Goals Patient Stated Goal: to return to PLOF OT Goal Formulation: With patient Time For Goal Achievement: 07/14/23 Potential to Achieve Goals: Good ADL Goals Pt Will Perform Grooming: standing;with modified independence Pt Will Perform Lower Body Dressing: with modified independence;sitting/lateral leans Pt Will Transfer to Toilet: with modified independence;bedside commode Pt Will Perform Toileting - Clothing Manipulation and hygiene: with modified independence;sitting/lateral leans  OT Frequency: Min 1X/week    Co-evaluation PT/OT/SLP Co-Evaluation/Treatment: Yes Reason for Co-Treatment: To address functional/ADL transfers;Complexity of the patient's impairments (  multi-system involvement)   OT goals addressed during session: ADL's and self-care;Proper use of Adaptive equipment and DME      AM-PAC OT "6 Clicks" Daily Activity     Outcome Measure Help from another person eating meals?: None Help from another person taking care of personal grooming?: A Little Help from another person toileting, which includes using toliet, bedpan, or urinal?: A Lot Help from another person bathing (including  washing, rinsing, drying)?: A Lot Help from another person to put on and taking off regular upper body clothing?: A Little Help from another person to put on and taking off regular lower body clothing?: A Lot 6 Click Score: 16   End of Session Equipment Utilized During Treatment: Gait belt;Rolling walker (2 wheels) Nurse Communication: Mobility status  Activity Tolerance: Patient limited by pain Patient left: in bed;with call bell/phone within reach;with bed alarm set  OT Visit Diagnosis: Unsteadiness on feet (R26.81);Other abnormalities of gait and mobility (R26.89);Repeated falls (R29.6);Muscle weakness (generalized) (M62.81)                Time: 4098-1191 OT Time Calculation (min): 29 min Charges:  OT General Charges $OT Visit: 1 Visit OT Evaluation $OT Eval Moderate Complexity: 1 Mod  [employer], OTS

## 2023-07-01 DIAGNOSIS — M545 Low back pain, unspecified: Secondary | ICD-10-CM | POA: Diagnosis not present

## 2023-07-01 LAB — PROTIME-INR
INR: 2.8 — ABNORMAL HIGH (ref 0.8–1.2)
Prothrombin Time: 29.8 s — ABNORMAL HIGH (ref 11.4–15.2)

## 2023-07-01 MED ORDER — WARFARIN SODIUM 3 MG PO TABS
3.0000 mg | ORAL_TABLET | Freq: Once | ORAL | Status: AC
  Administered 2023-07-01: 3 mg via ORAL
  Filled 2023-07-01: qty 1

## 2023-07-01 NOTE — TOC Progression Note (Addendum)
Transition of Care [facility]) - Progression Note    Patient Details  Name: Suzanne Dixon MRN: [id number] Date of Birth: 1951-08-20  Transition of Care [facility]) CM/SW Contact  Liliana Cline, LCSW Phone Number: 07/01/2023, 12:20 PM  Clinical Narrative:    CSW met with patient at bedside. Patient states she is hopeful to return home with home health through [facility]. She states "if I can stand up, I can manage." She does not want SNF at this time.  Referral accepted by [place] with [facility] for PT, OT, RN, Aide.         Expected Discharge Plan and Services                                               Social Determinants of Health (SDOH) Interventions SDOH Screenings   Food Insecurity: Food Insecurity Present (06/29/2023)  Housing: Low Risk  (06/29/2023)  Transportation Needs: No Transportation Needs (06/29/2023)  Recent Concern: Transportation Needs - Unmet Transportation Needs (05/02/2023)   Received from [facility]  Utilities: Not At Risk (06/29/2023)  Financial Resource Strain: Low Risk  (06/09/2023)   Received from [facility]  Recent Concern: Financial Resource Strain - High Risk (05/02/2023)   Received from [facility]  Physical Activity: Insufficiently Active (05/02/2023)   Received from [facility]  Social Connections: Moderately Isolated (05/02/2023)   Received from [facility]  Stress: Stress Concern Present (05/02/2023)   Received from [facility]  Tobacco Use: Low Risk  (06/29/2023)  Health Literacy: Inadequate Health Literacy (05/02/2023)   Received from [facility]    Readmission Risk Interventions    06/21/2021    4:12 PM  Readmission Risk Prevention Plan  Transportation Screening Complete  PCP or Specialist Appt within 3-5 Days Complete  HRI or Home Care Consult Complete  Social Work Consult for Recovery Care  Planning/Counseling Complete  Palliative Care Screening Not Applicable  Medication Review Oceanographer) Complete

## 2023-07-01 NOTE — Progress Notes (Signed)
Progress Note   Patient: Suzanne Dixon NGE:[id number] DOB: June 03, 1951 DOA: 06/29/2023     0 DOS: the patient was seen and examined on 07/01/2023   Brief hospital course:   Suzanne Dixon is a 72 y.o. female with medical history significant of recurrent UTI, Parkinson's disease, hypertension, OSA on CPAP, atrial fibrillation on Warfarin, lymphedema, degenerative disc disease, breast cancer s/p chemo mastectomy and radiation, hypothyroidism, neuropathy, gout, who presents to the ED due to back pain. Suzanne Dixon states that she has been experiencing recurrent UTIs for the last few months.  She is currently on her third course of antibiotics that she started on the evening of 10/07.  She notes that her dysuria has resolved but she still experiencing some urgency although it is much improved.  She denies any persistent abdominal pain, stating she occasionally experiences some right-sided abdominal pain. Suzanne Dixon states what actually brought her in today was her back pain.  She states that when she climbed up on the CT scanner on 10/02, she had sudden sharp pain in her back that radiated bilaterally around to her groin and down her legs.  The pain has persisted since then, however in the last 24 hours, her pain seemed to have worsened.  When she tries to stand up, she feels weak and as though she is going to fall.  Due to this, she stated in her wheelchair all night.  She notes that she lives alone and is quite worried about suffering a fall.  She denies any other back pain or leg pain.  She denies any urinary or fecal incontinence.  No paresthesias.    Patient states she uses a wheelchair at home but was able to take some steps on her own before.    ED course: On arrival to the ED, patient was hypertensive at 156/98 with heart rate of 68.  She was saturating at 100% on room air.  She was afebrile at 97.7.Initial workup notable for normal CBC, BMP, CK.  Urinalysis with hematuria, ketonuria, leukocytes,  and increased WBC/hpf.  CT renal stone study was obtained with no acute findings within the abdomen or pelvis with bilateral nonobstructive kidney stones.  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  No acute findings.  Patient started on ceftriaxone  MRI showed : Multilevel lumbar spondylosis, worst at L3-4, where there is severe spinal canal stenosis and moderate bilateral neural foraminal narrowing. Moderate spinal canal stenosis at T11-12 and L2-3. Moderate to severe neural foraminal narrowing at multiple additional levels, as detailed above.  10/12 : Patient symptomatically feeling better with no acute event.  Evaluated by PT recommends rehab patient initially wanted to go home however agrees to a certain rehab facility.  Patient pending rehab placement.  Assessment and Plan: * Acute back pain Patient reporting back pain since having a CT scan on 10/02 where she believes she may have pulled a muscle.  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  Like acute exacerbation with low suspicion for spinal process. MRI ordered in the ED and pending.   Will trial low-dose muscle relaxers with Tylenol. If ineffective, can try low dose opioid.  Given patient lives alone, high fall risk and required 2 person assist in the ED to stand up, will need to be admitted for PT/OT evaluation.  - PT/OT -recommends rehab - Robaxin 500 mg 3 times daily as needed - Tylenol 650 mg  4 times daily scheduled - Norco 3 times daily for severe pain not relieved by regimen above - Continue home Meloxicam  Acute cystitis Patient has a history of chronic UTIs and has been on numerous rounds over the last few months. Most recent culture from 06/10/2023 demonstrated Klebsiella pneumoniae with resistant to Ampicillin, Bactrim, and Macrobid. She has been on Cefdinir since 10/07 with improvement in  symptoms.  She has already been transition to IV in the ED and will continue while she is admitted for now.  - Continue IV ceftriaxone while admitted day -3 of Abx will give additional 1 day of antibiotics prior to discharge.   AF (paroxysmal atrial fibrillation)  - Decrease home Metoprolol given bradycardia - Warfarin per pharmacy dosing  Parkinsonism (HCC) - Continue home regimen  Hypothyroidism - Continue home regimen  Large granular lymphocytic leukemia  Follows with oncology, currently under surveillance only.  No evidence of osseous lesion seen on CT imaging.  Apnea, sleep - CPAP at bedtime  Hypertension - Continue home regimen      Subjective: Patient seen and examined in the morning rounds.  No overnight events.  Feeling better.  PT recommended rehab, patient initially wanted to go home but now agreeable to rehab.  Currently being managed with ceftriaxone for mild UTI.  Will discontinue antibiotics prior to discharge.  Physical Exam: Vitals:   07/01/23 0545 07/01/23 0800 07/01/23 1250 07/01/23 1343  BP: (!) 142/84 (!) 142/86 139/79 131/76  Pulse: 71 66 76 94  Resp: 18 18 16 20   Temp: (!) 97.5 F (36.4 C) (!) 97.5 F (36.4 C) 98.1 F (36.7 C) 98.3 F (36.8 C)  TempSrc: Oral Oral Oral Oral  SpO2: 99% 100% 100% 98%  Weight:      Height:       Physical Exam Constitutional:      Appearance: Normal appearance.  HENT:     Right Ear: Tympanic membrane normal.     Nose: Nose normal.  Eyes:     Extraocular Movements: Extraocular movements intact.     Pupils: Pupils are equal, round, and reactive to light.  Cardiovascular:     Rate and Rhythm: Normal rate.  Pulmonary:     Effort: Pulmonary effort is normal.  Abdominal:     Palpations: Abdomen is soft.  Musculoskeletal:     Cervical back: Normal range of motion.     Comments: Low back tenderness bilaterally  Skin:    General: Skin is warm.  Neurological:     General: No focal deficit present.     Mental  Status: She is alert and oriented to person, place, and time.  Psychiatric:        Mood and Affect: Mood normal.     Data Reviewed:  There are no new results to review at this time.  Family Communication: None by bedside  Disposition: Status is: Observation The patient remains OBS appropriate and will d/c before 2 midnights.  Planned Discharge Destination: Skilled nursing facility    Time spent: 35 minutes   Author: Kirstie Peri, MD 07/01/2023 3:25 PM  For on call review [email or web address].

## 2023-07-01 NOTE — Plan of Care (Signed)

## 2023-07-01 NOTE — Care Management Obs Status (Signed)
MEDICARE OBSERVATION STATUS NOTIFICATION   Patient Details  Name: Suzanne Dixon MRN: [id number] Date of Birth: 1951/04/05   Medicare Observation Status Notification Given:  Yes    Corneilus Heggie E Rubens Cranston, LCSW 07/01/2023, 12:20 PM

## 2023-07-01 NOTE — Progress Notes (Signed)
Introduced patient to role of Statistician. Patient has an established PCP, with whom she sees regularly. Patient does NOT have established mental health provider for depression. Patient states she does not need this at this time. Patient lives by herself. Transportation to/from appointments is provided by family or caretaker. Patient states she has a caretaker come in on M, W, F from 9am-1pm. She is delivered lunch M-F from [employer]. Patient states she eats already prepared items such as cereal, left overs or sandwiches for other meal times.  Patient would like to have services from [facility], PT/OT if able to d/c home. She states she has used them in the past and found them to be "wonderful."

## 2023-07-01 NOTE — Plan of Care (Signed)
  Problem: Clinical Measurements: Goal: Ability to maintain clinical measurements within normal limits will improve Outcome: Progressing Goal: Will remain free from infection Outcome: Progressing Goal: Diagnostic test results will improve Outcome: Progressing   Problem: Nutrition: Goal: Adequate nutrition will be maintained Outcome: Progressing   Problem: Coping: Goal: Level of anxiety will decrease Outcome: Progressing   Problem: Elimination: Goal: Will not experience complications related to bowel motility Outcome: Progressing   Problem: Pain Managment: Goal: General experience of comfort will improve Outcome: Progressing

## 2023-07-01 NOTE — Consult Note (Signed)
Pharmacy Consult Note - Anticoagulation  Pharmacy Consult for warfarin Indication: atrial fibrillation  PATIENT MEASUREMENTS: Height: 5\' 4"  (162.6 cm) Weight: 106 kg (233 lb 11 oz) IBW/kg (Calculated) : 54.7 HEPARIN DW (KG): 79.7  VITAL SIGNS: Temp: 97.5 F (36.4 C) (10/12 0545) Temp Source: Oral (10/12 0545) BP: 142/84 (10/12 0545) Pulse Rate: 71 (10/12 0545)  Recent Labs    06/29/23 1023 06/29/23 2018 06/29/23 2018 06/30/23 0445 07/01/23 0359  HGB 13.5  --   --  12.7  --   HCT 39.2  --   --  38.0  --   PLT 235  --   --  222  --   APTT  --  37*  --   --   --   LABPROT  --  33.0*   < > 32.4* 29.8*  INR  --  3.2*   < > 3.1* 2.8*  CREATININE 0.65  --   --   --   --   CKTOTAL 68  --   --   --   --    < > = values in this interval not displayed.    Estimated Creatinine Clearance: 73.2 mL/min (by C-G formula based on SCr of 0.65 mg/dL).  PAST MEDICAL HISTORY: Past Medical History:  Diagnosis Date   A-fib (HCC)    Anemia    B12 deficiency    Breast cancer (HCC) 03/31/2013   Right - chemo- mastectomy   Breast cancer (HCC) 1991   Rt.- radiation   Complication of anesthesia 01/2009   Recent years with general anesthesia had itching following surgery   DDD (degenerative disc disease), cervical    Depression    Diverticulitis    Dyspnea    Edema of both legs    GERD (gastroesophageal reflux disease)    Gout    H/O mastectomy 2014   per patient   H/O: cesarean section 1987   per patient report   H/O: hysterectomy 1996   per patient   Hydradenitis    Hypertension    Hypothyroidism    Neuropathy    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HCC)    Parkinson's disease (HCC)    Personal history of chemotherapy    Personal history of radiation therapy    Ruptured cervical disc 2002   per patient    Sleep apnea    cpap machine   Super obese    Tachycardia     ASSESSMENT: 72 y.o. female with PMH Afib (on warfarin), HTN, breast cancer s/p mastectomy and chemoradiation,  recurrent UTIs is presenting with acute lower back pain. Patient is on chronic anticoagulation per chart review. Pharmacy has been consulted to initiate and manage warfarin.  Pertinent medications: Home warfarin regimen  Warfarin 6 mg PO once daily on Tue, Wed, Thu, Sat, Sun  Warfarin 3 mg PO once daily on Mon and Fri Last warfarin dose PTA was evening of 10/9 Patient has 6 mg and 1 mg tablets at home  Goal(s) of therapy: INR 2 - 3 Monitor platelets by anticoagulation protocol: Yes   Baseline anticoagulation labs: Recent Labs    06/29/23 1023 06/29/23 2018 06/30/23 0445 07/01/23 0359  APTT  --  37*  --   --   INR  --  3.2* 3.1* 2.8*  HGB 13.5  --  12.7  --   PLT 235  --  222  --    Date INR Warfarin Dose  10/10 3.2 HELD  10/11 3.1 1 mg (ordered)  10/12  2.8      PLAN: INR on higher end of goal but therapeutic. Patient remains of abx for UTI. Will give warfarin 3 mg x 1 tonight to prevent patient from decreasing below goal. Re-assess with AM labs.  Bettey Costa, PharmD Clinical Pharmacist 07/01/2023 8:27 AM

## 2023-07-02 DIAGNOSIS — M545 Low back pain, unspecified: Secondary | ICD-10-CM | POA: Diagnosis not present

## 2023-07-02 LAB — PROTIME-INR
INR: 2.2 — ABNORMAL HIGH (ref 0.8–1.2)
Prothrombin Time: 24.4 s — ABNORMAL HIGH (ref 11.4–15.2)

## 2023-07-02 MED ORDER — WARFARIN SODIUM 6 MG PO TABS
6.0000 mg | ORAL_TABLET | Freq: Once | ORAL | Status: AC
  Administered 2023-07-02: 6 mg via ORAL
  Filled 2023-07-02: qty 1

## 2023-07-02 NOTE — Consult Note (Signed)
Pharmacy Consult Note - Anticoagulation  Pharmacy Consult for warfarin Indication: atrial fibrillation  PATIENT MEASUREMENTS: Height: 5\' 4"  (162.6 cm) Weight: 106 kg (233 lb 11 oz) IBW/kg (Calculated) : 54.7 HEPARIN DW (KG): 79.7  VITAL SIGNS: Temp: 97.5 F (36.4 C) (10/13 0757) Temp Source: Oral (10/13 0606) BP: 155/88 (10/13 0757) Pulse Rate: 71 (10/13 0757)  Recent Labs    06/29/23 1023 06/29/23 2018 06/29/23 2018 06/30/23 0445 07/01/23 0359 07/02/23 0616  HGB 13.5  --   --  12.7  --   --   HCT 39.2  --   --  38.0  --   --   PLT 235  --   --  222  --   --   APTT  --  37*  --   --   --   --   LABPROT  --  33.0*   < > 32.4*   < > 24.4*  INR  --  3.2*   < > 3.1*   < > 2.2*  CREATININE 0.65  --   --   --   --   --   CKTOTAL 68  --   --   --   --   --    < > = values in this interval not displayed.    Estimated Creatinine Clearance: 73.2 mL/min (by C-G formula based on SCr of 0.65 mg/dL).  PAST MEDICAL HISTORY: Past Medical History:  Diagnosis Date   A-fib (HCC)    Anemia    B12 deficiency    Breast cancer (HCC) 03/31/2013   Right - chemo- mastectomy   Breast cancer (HCC) 1991   Rt.- radiation   Complication of anesthesia 01/2009   Recent years with general anesthesia had itching following surgery   DDD (degenerative disc disease), cervical    Depression    Diverticulitis    Dyspnea    Edema of both legs    GERD (gastroesophageal reflux disease)    Gout    H/O mastectomy 2014   per patient   H/O: cesarean section 1987   per patient report   H/O: hysterectomy 1996   per patient   Hydradenitis    Hypertension    Hypothyroidism    Neuropathy    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HCC)    Parkinson's disease (HCC)    Personal history of chemotherapy    Personal history of radiation therapy    Ruptured cervical disc 2002   per patient    Sleep apnea    cpap machine   Super obese    Tachycardia     ASSESSMENT: 72 y.o. female with PMH Afib (on warfarin),  HTN, breast cancer s/p mastectomy and chemoradiation, recurrent UTIs is presenting with acute lower back pain. Patient is on chronic anticoagulation per chart review. Pharmacy has been consulted to initiate and manage warfarin.  Pertinent medications: Home warfarin regimen  Warfarin 6 mg PO once daily on Tue, Wed, Thu, Sat, Sun  Warfarin 3 mg PO once daily on Mon and Fri Last warfarin dose PTA was evening of 10/9 Patient has 6 mg and 1 mg tablets at home  Goal(s) of therapy: INR 2 - 3 Monitor platelets by anticoagulation protocol: Yes   Baseline anticoagulation labs: Recent Labs    06/29/23 1023 06/29/23 2018 06/29/23 2018 06/30/23 0445 07/01/23 0359 07/02/23 0616  APTT  --  37*  --   --   --   --   INR  --  3.2*   < >  3.1* 2.8* 2.2*  HGB 13.5  --   --  12.7  --   --   PLT 235  --   --  222  --   --    < > = values in this interval not displayed.   Date INR Warfarin Dose  10/10 3.2 HELD  10/11 3.1 1 mg (ordered)  10/12 2.8 3 mg  10/13 2.2      PLAN: INR therapeutic today Will give warfarin 6 mg x 1 tonight(home dose) Re-assess INR with AM labs.  Bettey Costa, PharmD Clinical Pharmacist 07/02/2023 9:14 AM

## 2023-07-02 NOTE — Plan of Care (Signed)
Problem: Education: Goal: Knowledge of General Education information will improve Description: Including pain rating scale, medication(s)/side effects and non-pharmacologic comfort measures Outcome: Progressing   Problem: Health Behavior/Discharge Planning: Goal: Ability to manage health-related needs will improve Outcome: Progressing   Problem: Clinical Measurements: Goal: Ability to maintain clinical measurements within normal limits will improve Outcome: Progressing Goal: Will remain free from infection Outcome: Progressing Goal: Diagnostic test results will improve Outcome: Progressing Goal: Cardiovascular complication will be avoided Outcome: Progressing   Problem: Nutrition: Goal: Adequate nutrition will be maintained Outcome: Progressing   Problem: Coping: Goal: Level of anxiety will decrease Outcome: Progressing   Problem: Elimination: Goal: Will not experience complications related to bowel motility Outcome: Progressing Goal: Will not experience complications related to urinary retention Outcome: Progressing   Problem: Safety: Goal: Ability to remain free from injury will improve Outcome: Progressing   Problem: Skin Integrity: Goal: Risk for impaired skin integrity will decrease Outcome: Progressing

## 2023-07-02 NOTE — TOC Progression Note (Signed)
Transition of Care [facility]) - Progression Note    Patient Details  Name: Suzanne Dixon MRN: [id number] Date of Birth: 02/14/1951  Transition of Care [facility]) CM/SW Contact  Liliana Cline, LCSW Phone Number: 07/02/2023, 1:59 PM  Clinical Narrative:    Spoke to patient, patient is now agreeable to SNF. Does not want [facility]. Referral sent out. Awaiting bed offers.        Expected Discharge Plan and Services                                               Social Determinants of Health (SDOH) Interventions SDOH Screenings   Food Insecurity: Food Insecurity Present (06/29/2023)  Housing: Low Risk  (06/29/2023)  Transportation Needs: No Transportation Needs (06/29/2023)  Recent Concern: Transportation Needs - Unmet Transportation Needs (05/02/2023)   Received from [facility]  Utilities: Not At Risk (06/29/2023)  Financial Resource Strain: Low Risk  (06/09/2023)   Received from [facility]  Recent Concern: Financial Resource Strain - High Risk (05/02/2023)   Received from [facility]  Physical Activity: Insufficiently Active (05/02/2023)   Received from [facility]  Social Connections: Moderately Isolated (05/02/2023)   Received from [facility]  Stress: Stress Concern Present (05/02/2023)   Received from [facility]  Tobacco Use: Low Risk  (06/29/2023)  Health Literacy: Inadequate Health Literacy (05/02/2023)   Received from [facility]    Readmission Risk Interventions    06/21/2021    4:12 PM  Readmission Risk Prevention Plan  Transportation Screening Complete  PCP or Specialist Appt within 3-5 Days Complete  HRI or Home Care Consult Complete  Social Work Consult for Recovery Care Planning/Counseling Complete  Palliative Care Screening Not Applicable  Medication Review [employer]) Complete

## 2023-07-02 NOTE — Progress Notes (Signed)
Progress Note   Patient: Suzanne Dixon ZOX:[id number] DOB: [birth year] DOA: 06/29/2023     0 DOS: the patient was seen and examined on 07/02/2023   Brief hospital course:   Suzanne Dixon is a 72 y.o. female with medical history significant of recurrent UTI, Parkinson's disease, hypertension, OSA on CPAP, atrial fibrillation on Warfarin, lymphedema, degenerative disc disease, breast cancer s/p chemo mastectomy and radiation, hypothyroidism, neuropathy, gout, who presents to the ED due to back pain. Mrs. Suzanne Dixon states that she has been experiencing recurrent UTIs for the last few months.  She is currently on her third course of antibiotics that she started on the evening of 10/07.  She notes that her dysuria has resolved but she still experiencing some urgency although it is much improved.  She denies any persistent abdominal pain, stating she occasionally experiences some right-sided abdominal pain. Mrs. Suzanne Dixon states what actually brought her in today was her back pain.  She states that when she climbed up on the CT scanner on 10/02, she had sudden sharp pain in her back that radiated bilaterally around to her groin and down her legs.  The pain has persisted since then, however in the last 24 hours, her pain seemed to have worsened.  When she tries to stand up, she feels weak and as though she is going to fall.  Due to this, she stated in her wheelchair all night.  She notes that she lives alone and is quite worried about suffering a fall.  She denies any other back pain or leg pain.  She denies any urinary or fecal incontinence.  No paresthesias.    Patient states she uses a wheelchair at home but was able to take some steps on her own before.    ED course: On arrival to the ED, patient was hypertensive at 156/98 with heart rate of 68.  She was saturating at 100% on room air.  She was afebrile at 97.7.Initial workup notable for normal CBC, BMP, CK.  Urinalysis with hematuria, ketonuria, leukocytes,  and increased WBC/hpf.  CT renal stone study was obtained with no acute findings within the abdomen or pelvis with bilateral nonobstructive kidney stones.  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  No acute findings.  Patient started on ceftriaxone  MRI showed : Multilevel lumbar spondylosis, worst at L3-4, where there is severe spinal canal stenosis and moderate bilateral neural foraminal narrowing. Moderate spinal canal stenosis at T11-12 and L2-3. Moderate to severe neural foraminal narrowing at multiple additional levels, as detailed above.  10/12 : Patient symptomatically feeling better with no acute event.  Evaluated by PT recommends rehab patient initially wanted to go home however agrees to a certain rehab facility.  Patient pending rehab placement.  10/13 : Patient wants to be placed at rehab facility pending placement.  Assessment and Plan: * Acute back pain Patient reporting back pain since having a CT scan on 10/02 where she believes she may have pulled a muscle.  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  Like acute exacerbation with low suspicion for spinal process. MRI ordered in the ED and pending.   Will trial low-dose muscle relaxers with Tylenol. If ineffective, can try low dose opioid.  Given patient lives alone, high fall risk and required 2 person assist in the ED to stand up, will need to be admitted for PT/OT evaluation.  - PT/OT -recommends rehab -  Robaxin 500 mg 3 times daily as needed - Tylenol 650 mg 4 times daily scheduled - Norco 3 times daily for severe pain not relieved by regimen above - Continue home Meloxicam  Acute cystitis Patient has a history of chronic UTIs and has been on numerous rounds over the last few months. Most recent culture from 06/10/2023 demonstrated Klebsiella pneumoniae with resistant to Ampicillin, Bactrim, and  Macrobid. She has been on Cefdinir since 10/07 with improvement in symptoms.  She has already been transition to IV in the ED and will continue while she is admitted for now.  - Continue IV ceftriaxone while admitted day -3 of Abx will give additional 1 day of antibiotics prior to discharge.   AF (paroxysmal atrial fibrillation)  - Decrease home Metoprolol given bradycardia - Warfarin per pharmacy dosing  Parkinsonism (HCC) - Continue home regimen  Hypothyroidism - Continue home regimen  Large granular lymphocytic leukemia  Follows with oncology, currently under surveillance only.  No evidence of osseous lesion seen on CT imaging.  Apnea, sleep - CPAP at bedtime  Hypertension - Continue home regimen      Subjective: Patient seen and examined in the morning rounds.  No overnight events. PT recommended rehab, patient initially wanted to go home but now agreeable to rehab.  Currently being managed with ceftriaxone for mild UTI.  Will discontinue antibiotics prior to discharge.  Physical Exam: Vitals:   07/01/23 1600 07/01/23 2146 07/02/23 0606 07/02/23 0757  BP: 110/85 (!) 141/94 (!) 165/91 (!) 155/88  Pulse: 76 81 63 71  Resp: 18 16 (!) 22 16  Temp: 98 F (36.7 C) 97.9 F (36.6 C) (!) 97.4 F (36.3 C) (!) 97.5 F (36.4 C)  TempSrc:  Oral Oral   SpO2: 98% 95% 99% 99%  Weight:      Height:       Physical Exam Constitutional:      Appearance: Normal appearance.  HENT:     Right Ear: Tympanic membrane normal.     Nose: Nose normal.  Eyes:     Extraocular Movements: Extraocular movements intact.     Pupils: Pupils are equal, round, and reactive to light.  Cardiovascular:     Rate and Rhythm: Normal rate.  Pulmonary:     Effort: Pulmonary effort is normal.  Abdominal:     Palpations: Abdomen is soft.  Musculoskeletal:     Cervical back: Normal range of motion.     Comments: Low back tenderness bilaterally  Skin:    General: Skin is warm.  Neurological:      General: No focal deficit present.     Mental Status: She is alert and oriented to person, place, and time.  Psychiatric:        Mood and Affect: Mood normal.     Data Reviewed:  There are no new results to review at this time.  Family Communication: None by bedside  Disposition: Status is: Observation The patient remains OBS appropriate and will d/c before 2 midnights.  Planned Discharge Destination: Skilled nursing facility    Time spent: 35 minutes   Author: Kirstie Peri, MD 07/02/2023 1:19 PM  For on call review [email or web address].

## 2023-07-02 NOTE — Plan of Care (Signed)
Problem: Clinical Measurements: Goal: Will remain free from infection Outcome: Progressing Goal: Diagnostic test results will improve Outcome: Progressing   Problem: Nutrition: Goal: Adequate nutrition will be maintained Outcome: Progressing   Problem: Coping: Goal: Level of anxiety will decrease Outcome: Progressing   Problem: Pain Managment: Goal: General experience of comfort will improve Outcome: Progressing   Problem: Safety: Goal: Ability to remain free from injury will improve Outcome: Progressing   Problem: Skin Integrity: Goal: Risk for impaired skin integrity will decrease Outcome: Progressing

## 2023-07-02 NOTE — Progress Notes (Signed)
Physical Therapy Treatment Patient Details Name: Suzanne Dixon MRN: [id number] DOB: Jul 07, 1951 Today's Date: 07/02/2023   History of Present Illness 72 y/o female presented to ED on 06/29/23 for back pain. MRI showed multilevel lumbar spondylosis (worst at L3-4) with severe spinal canal stenosis and moderate bilateral neural foraminal. PMH: paroxysmal atrial fibrillation, hypertension, prior CVA, obesity, hx of breast cancer s/p chemo, mastectomy,and radiation, neuropathy, lymphedema, parkinson's disease    PT Comments  Pt ready for session.  Struggled to EOB with rail and HOB elevated needing min a x 1 to get to EOB.  Steady once sitting.  She struggles to stand x 2 at EOB with max a x 2 from elevated bed with knees buckling and heavy UE assist.  She is unable to take any sidesteps or truly straighten legs for a quality stand.  Unable to lateral scoot in sitting to allow for sliding transfer to chair.  Returned to supine with mod a x 2.    Pt had been refusing SNF but does state today she will need rehab before transitioning home.  TOC is aware.   If plan is discharge home, recommend the following: Assistance with cooking/housework;Assist for transportation;Help with stairs or ramp for entrance;Two people to help with walking and/or transfers;Two people to help with bathing/dressing/bathroom   Can travel by private vehicle        Equipment Recommendations  Rolling walker (2 wheels);BSC/3in1    Recommendations for Other Services       Precautions / Restrictions Precautions Precautions: Fall Restrictions Weight Bearing Restrictions: No     Mobility  Bed Mobility Overal bed mobility: Needs Assistance Bed Mobility: Supine to Sit, Sit to Supine     Supine to sit: Min assist, HOB elevated, Used rails Sit to supine: Max assist        Transfers Overall transfer level: Needs assistance Equipment used: Rolling walker (2 wheels) Transfers: Sit to/from Stand Sit to Stand: Mod  assist, Max assist, +2 physical assistance           General transfer comment: standing tolerance about 15-20 seconds before B knees buckling.  very weak and high fall risk unable to step or progress to chair transfer    Ambulation/Gait                   Stairs             Wheelchair Mobility     Tilt Bed    Modified Rankin (Stroke Patients Only)       Balance Overall balance assessment: History of Falls, Needs assistance Sitting-balance support: Feet supported, No upper extremity supported Sitting balance-Leahy Scale: Good     Standing balance support: Bilateral upper extremity supported, Reliant on assistive device for balance, During functional activity Standing balance-Leahy Scale: Zero Standing balance comment: max a x 2 to remain standing                            Cognition Arousal: Alert Behavior During Therapy: WFL for tasks assessed/performed Overall Cognitive Status: Within Functional Limits for tasks assessed                                          Exercises      General Comments        Pertinent Vitals/Pain Pain Assessment Pain Assessment: Faces Faces Pain  Scale: Hurts little more Pain Location: Bil hips Pain Descriptors / Indicators: Burning, Grimacing Pain Intervention(s): Limited activity within patient's tolerance, Monitored during session, Repositioned    Home Living                          Prior Function            PT Goals (current goals can now be found in the care plan section) Progress towards PT goals: Progressing toward goals    Frequency    Min 1X/week      PT Plan      Co-evaluation              AM-PAC PT "6 Clicks" Mobility   Outcome Measure  Help needed turning from your back to your side while in a flat bed without using bedrails?: A Little Help needed moving from lying on your back to sitting on the side of a flat bed without using bedrails?: A  Lot Help needed moving to and from a bed to a chair (including a wheelchair)?: Total Help needed standing up from a chair using your arms (e.g., wheelchair or bedside chair)?: Total Help needed to walk in hospital room?: Total Help needed climbing 3-5 steps with a railing? : Total 6 Click Score: 9    End of Session Equipment Utilized During Treatment: Gait belt Activity Tolerance: Patient limited by fatigue Patient left: in bed;with call bell/phone within reach;with bed alarm set Nurse Communication: Mobility status PT Visit Diagnosis: Unsteadiness on feet (R26.81);Muscle weakness (generalized) (M62.81);Difficulty in walking, not elsewhere classified (R26.2);Pain     Time: 4742-5956 PT Time Calculation (min) (ACUTE ONLY): 16 min  Charges:    $Therapeutic Activity: 8-22 mins PT General Charges $$ ACUTE PT VISIT: 1 Visit                   Danielle Dess, PTA 07/02/23, 12:57 PM

## 2023-07-03 DIAGNOSIS — M545 Low back pain, unspecified: Secondary | ICD-10-CM | POA: Diagnosis not present

## 2023-07-03 LAB — PROTIME-INR
INR: 2.1 — ABNORMAL HIGH (ref 0.8–1.2)
Prothrombin Time: 23.7 s — ABNORMAL HIGH (ref 11.4–15.2)

## 2023-07-03 LAB — CBC
HCT: 37.7 % (ref 36.0–46.0)
Hemoglobin: 12.8 g/dL (ref 12.0–15.0)
MCH: 31 pg (ref 26.0–34.0)
MCHC: 34 g/dL (ref 30.0–36.0)
MCV: 91.3 fL (ref 80.0–100.0)
Platelets: 225 10*3/uL (ref 150–400)
RBC: 4.13 MIL/uL (ref 3.87–5.11)
RDW: 13.8 % (ref 11.5–15.5)
WBC: 6.8 10*3/uL (ref 4.0–10.5)
nRBC: 0 % (ref 0.0–0.2)

## 2023-07-03 MED ORDER — POLYETHYLENE GLYCOL 3350 17 G PO PACK
17.0000 g | PACK | Freq: Every day | ORAL | Status: DC
  Administered 2023-07-04 – 2023-07-10 (×6): 17 g via ORAL
  Filled 2023-07-03 (×8): qty 1

## 2023-07-03 MED ORDER — WARFARIN SODIUM 3 MG PO TABS
3.0000 mg | ORAL_TABLET | Freq: Once | ORAL | Status: AC
  Administered 2023-07-03: 3 mg via ORAL
  Filled 2023-07-03: qty 1

## 2023-07-03 MED ORDER — SIMETHICONE 80 MG PO CHEW: 80.0000 mg | CHEWABLE_TABLET | Freq: Four times a day (QID) | ORAL | Status: DC | PRN

## 2023-07-03 NOTE — Progress Notes (Signed)
Progress Note   Patient: Suzanne Dixon RCV:[id number] DOB: 08/09/1951 DOA: 06/29/2023     0 DOS: the patient was seen and examined on 07/03/2023   Brief hospital course:   Suzanne Dixon is a 72 y.o. female with medical history significant of recurrent UTI, Parkinson's disease, hypertension, OSA on CPAP, atrial fibrillation on Warfarin, lymphedema, degenerative disc disease, breast cancer s/p chemo mastectomy and radiation, hypothyroidism, neuropathy, gout, who presents to the ED due to back pain. Suzanne Dixon states that she has been experiencing recurrent UTIs for the last few months.  She is currently on her third course of antibiotics that she started on the evening of 10/07.  She notes that her dysuria has resolved but she still experiencing some urgency although it is much improved.  She denies any persistent abdominal pain, stating she occasionally experiences some right-sided abdominal pain. Suzanne Dixon states what actually brought her in today was her back pain.  She states that when she climbed up on the CT scanner on 10/02, she had sudden sharp pain in her back that radiated bilaterally around to her groin and down her legs.  The pain has persisted since then, however in the last 24 hours, her pain seemed to have worsened.  When she tries to stand up, she feels weak and as though she is going to fall.  Due to this, she stated in her wheelchair all night.  She notes that she lives alone and is quite worried about suffering a fall.  She denies any other back pain or leg pain.  She denies any urinary or fecal incontinence.  No paresthesias.    Patient states she uses a wheelchair at home but was able to take some steps on her own before.    ED course: On arrival to the ED, patient was hypertensive at 156/98 with heart rate of 68.  She was saturating at 100% on room air.  She was afebrile at 97.7.Initial workup notable for normal CBC, BMP, CK.  Urinalysis with hematuria, ketonuria, leukocytes,  and increased WBC/hpf.  CT renal stone study was obtained with no acute findings within the abdomen or pelvis with bilateral nonobstructive kidney stones.  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  No acute findings.  Patient started on ceftriaxone  MRI showed : Multilevel lumbar spondylosis, worst at L3-4, where there is severe spinal canal stenosis and moderate bilateral neural foraminal narrowing. Moderate spinal canal stenosis at T11-12 and L2-3. Moderate to severe neural foraminal narrowing at multiple additional levels, as detailed above.  10/12 : Patient symptomatically feeling better with no acute event.  Evaluated by PT recommends rehab patient initially wanted to go home however agrees to a certain rehab facility.  Patient pending rehab placement.  10/13 : Patient wants to be placed at rehab facility pending placement.  Assessment and Plan: * Acute back pain, spinal stenosis Patient reporting back pain since having a CT scan on 10/02 where she believes she may have pulled a muscle.  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  Like acute exacerbation with low suspicion for spinal process. MRI ordered in the ED and pending.   Will trial low-dose muscle relaxers with Tylenol. If ineffective, can try low dose opioid.  Given patient lives alone, high fall risk and required 2 person assist in the ED to stand up, will need to be admitted for PT/OT evaluation.  - PT/OT -  recommends rehab - Robaxin 500 mg 3 times daily as needed - Tylenol 650 mg 4 times daily scheduled - Norco 3 times daily for severe pain not relieved by regimen above - Continue home Meloxicam  Acute cystitis Patient has a history of chronic UTIs and has been on numerous rounds over the last few months. Most recent culture from 06/10/2023 demonstrated Klebsiella pneumoniae with resistant to  Ampicillin, Bactrim, and Macrobid. She has been on Cefdinir since 10/07 with improvement in symptoms.  Culture negative and now 7 days of abx so will d/c  AF (paroxysmal atrial fibrillation)  - Decrease home Metoprolol given bradycardia - Warfarin per pharmacy dosing  Parkinsonism (HCC) - Continue home regimen  Hypothyroidism - Continue home regimen  Large granular lymphocytic leukemia  Follows with oncology, currently under surveillance only.  No evidence of osseous lesion seen on CT imaging.  Apnea, sleep - CPAP at bedtime  Hypertension - Continue home regimen      Subjective: Patient seen and examined in the morning rounds.  Pain controlled, BM last night, tolerating diet  Physical Exam: Vitals:   07/02/23 2134 07/02/23 2136 07/03/23 0525 07/03/23 0820  BP: 103/70 130/72 128/73 (!) 143/88  Pulse: 87 83 72 75  Resp:   18 18  Temp:   98.5 F (36.9 C) 99.2 F (37.3 C)  TempSrc:      SpO2: 97%  98% 100%  Weight:      Height:       Physical Exam Constitutional:      Appearance: Normal appearance.  HENT:     Right Ear: Tympanic membrane normal.     Nose: Nose normal.  Eyes:     Extraocular Movements: Extraocular movements intact.     Pupils: Pupils are equal, round, and reactive to light.  Cardiovascular:     Rate and Rhythm: Normal rate.  Pulmonary:     Effort: Pulmonary effort is normal.  Abdominal:     Palpations: Abdomen is soft.  Musculoskeletal:     Cervical back: Normal range of motion.     Comments: Low back tenderness bilaterally  Skin:    General: Skin is warm.  Neurological:     General: No focal deficit present.     Mental Status: She is alert and oriented to person, place, and time.  Psychiatric:        Mood and Affect: Mood normal.     Data Reviewed:  There are no new results to review at this time.  Family Communication: None by bedside  Disposition: Status is: Observation The patient remains OBS appropriate and will d/c before 2  midnights.  Planned Discharge Destination: Skilled nursing facility   Time spent: 35 minutes   Author: Silvano Bilis, MD 07/03/2023 9:46 AM  For on call review [email or web address].

## 2023-07-03 NOTE — Progress Notes (Signed)
Physical Therapy Treatment Patient Details Name: Suzanne Dixon MRN: [id number] DOB: 02-05-1951 Today's Date: 07/03/2023   History of Present Illness 72 y/o female presented to ED on 06/29/23 for back pain. MRI showed multilevel lumbar spondylosis (worst at L3-4) with severe spinal canal stenosis and moderate bilateral neural foraminal. PMH: paroxysmal atrial fibrillation, hypertension, prior CVA, obesity, hx of breast cancer s/p chemo, mastectomy,and radiation, neuropathy, lymphedema, parkinson's disease    PT Comments  Pt ready for session.  She is given increased time to practice getting to EOB.  After several minutes and max effort she does need min a x 1 to get fully upright.  Time is spent working on lateral and ant/post scooting but she is unable to make any real functional distances.  Seated AROM.  Returns to supine with mod a x 1.  Pt does c/o dizziness that seems to be more vestibular in nature vs orthostatic.  Will try to schedule pt with a therapist tomorrow that is better to assess it.   If plan is discharge home, recommend the following: Assistance with cooking/housework;Assist for transportation;Help with stairs or ramp for entrance;Two people to help with walking and/or transfers;Two people to help with bathing/dressing/bathroom   Can travel by private vehicle        Equipment Recommendations       Recommendations for Other Services       Precautions / Restrictions Precautions Precautions: Fall Restrictions Weight Bearing Restrictions: No     Mobility  Bed Mobility Overal bed mobility: Needs Assistance Bed Mobility: Supine to Sit, Sit to Supine     Supine to sit: Min assist, HOB elevated, Used rails Sit to supine: Mod assist   General bed mobility comments: significant increase in time to allow for self movement Patient Response: Cooperative  Transfers                   General transfer comment: time to practice lateral scoots but not able to  complete any functional distance    Ambulation/Gait                   Stairs             Wheelchair Mobility     Tilt Bed Tilt Bed Patient Response: Cooperative  Modified Rankin (Stroke Patients Only)       Balance Overall balance assessment: History of Falls, Needs assistance Sitting-balance support: Feet supported, No upper extremity supported Sitting balance-Leahy Scale: Good         Standing balance comment: deferred                            Cognition Arousal: Alert Behavior During Therapy: WFL for tasks assessed/performed Overall Cognitive Status: Within Functional Limits for tasks assessed                                          Exercises Other Exercises Other Exercises: seated AROM and time to practice functional scooting.    General Comments        Pertinent Vitals/Pain Pain Assessment Pain Assessment: Faces Faces Pain Scale: Hurts little more Pain Location: Bil hips Pain Descriptors / Indicators: Burning, Grimacing Pain Intervention(s): Limited activity within patient's tolerance, Monitored during session, Repositioned    Home Living  Prior Function            PT Goals (current goals can now be found in the care plan section) Progress towards PT goals: Progressing toward goals    Frequency    Min 1X/week      PT Plan      Co-evaluation              AM-PAC PT "6 Clicks" Mobility   Outcome Measure  Help needed turning from your back to your side while in a flat bed without using bedrails?: A Little Help needed moving from lying on your back to sitting on the side of a flat bed without using bedrails?: A Lot Help needed moving to and from a bed to a chair (including a wheelchair)?: Total Help needed standing up from a chair using your arms (e.g., wheelchair or bedside chair)?: Total Help needed to walk in hospital room?: Total Help needed  climbing 3-5 steps with a railing? : Total 6 Click Score: 9    End of Session Equipment Utilized During Treatment: Gait belt Activity Tolerance: Patient tolerated treatment well Patient left: in bed;with call bell/phone within reach;with bed alarm set Nurse Communication: Mobility status PT Visit Diagnosis: Unsteadiness on feet (R26.81);Muscle weakness (generalized) (M62.81);Difficulty in walking, not elsewhere classified (R26.2);Pain     Time: 3474-2595 PT Time Calculation (min) (ACUTE ONLY): 26 min  Charges:    $Therapeutic Exercise: 23-37 mins PT General Charges $$ ACUTE PT VISIT: 1 Visit                   Danielle Dess, PTA 07/03/23, 1:04 PM

## 2023-07-03 NOTE — Plan of Care (Signed)
Problem: Education: Goal: Knowledge of General Education information will improve Description: Including pain rating scale, medication(s)/side effects and non-pharmacologic comfort measures Outcome: Progressing   Problem: Health Behavior/Discharge Planning: Goal: Ability to manage health-related needs will improve Outcome: Progressing   Problem: Clinical Measurements: Goal: Ability to maintain clinical measurements within normal limits will improve Outcome: Progressing Goal: Will remain free from infection Outcome: Progressing Goal: Diagnostic test results will improve Outcome: Progressing Goal: Cardiovascular complication will be avoided Outcome: Progressing   Problem: Coping: Goal: Level of anxiety will decrease Outcome: Progressing   Problem: Elimination: Goal: Will not experience complications related to bowel motility Outcome: Progressing Goal: Will not experience complications related to urinary retention Outcome: Progressing   Problem: Pain Managment: Goal: General experience of comfort will improve Outcome: Progressing   Problem: Safety: Goal: Ability to remain free from injury will improve Outcome: Progressing   Problem: Skin Integrity: Goal: Risk for impaired skin integrity will decrease Outcome: Progressing

## 2023-07-03 NOTE — Consult Note (Signed)
Pharmacy Consult Note - Anticoagulation  Pharmacy Consult for warfarin Indication: atrial fibrillation  PATIENT MEASUREMENTS: Height: 5\' 4"  (162.6 cm) Weight: 106 kg (233 lb 11 oz) IBW/kg (Calculated) : 54.7 HEPARIN DW (KG): 79.7  VITAL SIGNS: Temp: 99.2 F (37.3 C) (10/14 0820) Temp Source: Oral (10/13 2133) BP: 143/88 (10/14 0820) Pulse Rate: 75 (10/14 0820)  Recent Labs    07/03/23 0430  LABPROT 23.7*  INR 2.1*    Estimated Creatinine Clearance: 73.2 mL/min (by C-G formula based on SCr of 0.65 mg/dL).  PAST MEDICAL HISTORY: Past Medical History:  Diagnosis Date   A-fib (HCC)    Anemia    B12 deficiency    Breast cancer (HCC) 03/31/2013   Right - chemo- mastectomy   Breast cancer (HCC) 1991   Rt.- radiation   Complication of anesthesia 01/2009   Recent years with general anesthesia had itching following surgery   DDD (degenerative disc disease), cervical    Depression    Diverticulitis    Dyspnea    Edema of both legs    GERD (gastroesophageal reflux disease)    Gout    H/O mastectomy 2014   per patient   H/O: cesarean section 1987   per patient report   H/O: hysterectomy 1996   per patient   Hydradenitis    Hypertension    Hypothyroidism    Neuropathy    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HCC)    Parkinson's disease (HCC)    Personal history of chemotherapy    Personal history of radiation therapy    Ruptured cervical disc 2002   per patient    Sleep apnea    cpap machine   Super obese    Tachycardia     ASSESSMENT: 72 y.o. female with PMH Afib (on warfarin), HTN, breast cancer s/p mastectomy and chemoradiation, recurrent UTIs is presenting with acute lower back pain. Patient is on chronic anticoagulation per chart review. Pharmacy has been consulted to initiate and manage warfarin.  Pertinent medications: Home warfarin regimen  Warfarin 6 mg PO once daily on Tue, Wed, Thu, Sat, Sun  Warfarin 3 mg PO once daily on Mon and Fri Last warfarin dose PTA  was evening of 10/9 Patient has 6 mg and 1 mg tablets at home  Goal(s) of therapy: INR 2 - 3 Monitor platelets by anticoagulation protocol: Yes   Baseline anticoagulation labs: Recent Labs    07/01/23 0359 07/02/23 0616 07/03/23 0430  INR 2.8* 2.2* 2.1*   Date INR Warfarin Dose  10/10 3.2 HELD  10/11 3.1 1 mg (ordered)  10/12 2.8 3 mg  10/13 2.2 6 mg  10/14 2.1 3 mg   Per RN, no signs/symptoms of bleeding noted. Hgb stable at 12.8, plt WNL at 225.  PLAN: INR therapeutic today Will give warfarin 3 mg x 1 tonight (home dose) Re-assess INR with AM labs.  Rockwell Alexandria, PharmD Clinical Pharmacist 07/03/2023 8:32 AM

## 2023-07-04 DIAGNOSIS — M545 Low back pain, unspecified: Secondary | ICD-10-CM | POA: Diagnosis not present

## 2023-07-04 LAB — PROTIME-INR
INR: 2.2 — ABNORMAL HIGH (ref 0.8–1.2)
Prothrombin Time: 24.4 s — ABNORMAL HIGH (ref 11.4–15.2)

## 2023-07-04 MED ORDER — AMLODIPINE BESYLATE 10 MG PO TABS
10.0000 mg | ORAL_TABLET | Freq: Every day | ORAL | Status: DC
  Administered 2023-07-04 – 2023-07-13 (×9): 10 mg via ORAL
  Filled 2023-07-04 (×10): qty 1

## 2023-07-04 MED ORDER — WARFARIN SODIUM 6 MG PO TABS
6.0000 mg | ORAL_TABLET | Freq: Once | ORAL | Status: AC
  Administered 2023-07-04: 6 mg via ORAL
  Filled 2023-07-04: qty 1

## 2023-07-04 NOTE — Consult Note (Signed)
WOC Nurse Consult Note: this consult performed remotely after review of EMR and chat with bedside nurse  Reason for Consult: open areas under skin folds abdomen  Wound type: Intertriginous dermatitis  ICD-10 CM Codes for Irritant Dermatitis  L30.4  - Erythema intertrigo. Also used for abrasion of the hand, chafing of the skin, dermatitis due to sweating and friction, friction dermatitis, friction eczema, and genital/thigh intertrigo.  Pressure Injury POA: NA  Measurement: see nursing flowsheet  Wound bed: scattered erythema and partial thickness skin loss due to moisture and friction  Drainage (amount, consistency, odor) see nursing flowsheet  Periwound: intact Dressing procedure/placement/frequency: Cleanse under abdominal folds with soap and water, dry thoroughly and apply Interdry AG Order [facility] # [phone] Measure and cut length of InterDry to fit in skin folds that have skin breakdown Tuck InterDry fabric into skin folds in a single layer, allow for 2 inches of overhang from skin edges to allow for wicking to occur May remove to bathe; dry area thoroughly and then tuck into affected areas again Do not apply any creams or ointments when using InterDry DO NOT THROW AWAY FOR 5 DAYS unless soiled with stool DO NOT [facility] product, this will inactivate the silver in the material  New sheet of Interdry should be applied after 5 days of use if patient continues to have skin breakdown    Per bedside nurse no breakdown underneath breasts or on buttocks.    WOC team will not follow at this time. Re-consult if further needs arise.   Thank you,    Priscella Mann MSN, RN-BC, [employer] [phone]

## 2023-07-04 NOTE — Progress Notes (Signed)
Physical Therapy Treatment Patient Details Name: SHEQUILA WEITKAMP MRN: [id number] DOB: 1951-08-21 Today's Date: 07/04/2023   History of Present Illness 72 y/o female presented to ED on 06/29/23 for back pain. MRI showed multilevel lumbar spondylosis (worst at L3-4) with severe spinal canal stenosis and moderate bilateral neural foraminal. PMH: paroxysmal atrial fibrillation, hypertension, prior CVA, obesity, hx of breast cancer s/p chemo, mastectomy,and radiation, neuropathy, lymphedema, parkinson's disease    PT Comments  Patient A&Ox4, reported 7/10 R hip/groin pain. Session focused on vestibular assessment due to pt complaints of dizziness this admission. Pt denied unilateral numbness/tingling, recent head trauma, stated her symptoms have been going on for a couple of months. Reported a history of previous vestibular issues and that she has been "flipped all around" previously and it did help. Overall pt needed min-modAx2 for all mobility, and R and L [employer] checked, twice. L Epley performed due to potential nystagmus noted, and on repeat testing pt reported improvement in symptoms. Seen with OT to maximize function/safety. The patient would benefit from further skilled PT intervention to continue to progress towards goals.      If plan is discharge home, recommend the following: Assistance with cooking/housework;Assist for transportation;Help with stairs or ramp for entrance;Two people to help with walking and/or transfers;Two people to help with bathing/dressing/bathroom   Can travel by private vehicle        Equipment Recommendations  Rolling walker (2 wheels);BSC/3in1    Recommendations for Other Services       Precautions / Restrictions Precautions Precautions: Fall Restrictions Weight Bearing Restrictions: No     Mobility  Bed Mobility Overal bed mobility: Needs Assistance Bed Mobility: Supine to Sit, Sit to Supine           General bed mobility comments: min-modA  x2 for all mobility today for vestibular assessment    Transfers                   General transfer comment: deferred due to pt fatigue after vestibular testing    Ambulation/Gait                   Stairs             Wheelchair Mobility     Tilt Bed    Modified Rankin (Stroke Patients Only)       Balance Overall balance assessment: History of Falls, Needs assistance Sitting-balance support: Feet supported, No upper extremity supported Sitting balance-Leahy Scale: Fair                                      Cognition Arousal: Alert Behavior During Therapy: WFL for tasks assessed/performed Overall Cognitive Status: Within Functional Limits for tasks assessed                                          Exercises      General Comments        Pertinent Vitals/Pain Pain Assessment Pain Assessment: 0-10 Pain Score: 7  Pain Location: R hip, groan Pain Descriptors / Indicators: Burning, Grimacing Pain Intervention(s): Limited activity within patient's tolerance, Monitored during session, Repositioned    Home Living  Prior Function            PT Goals (current goals can now be found in the care plan section) Progress towards PT goals: Progressing toward goals    Frequency    Min 1X/week      PT Plan      Co-evaluation PT/OT/SLP Co-Evaluation/Treatment: Yes Reason for Co-Treatment: To address functional/ADL transfers;Complexity of the patient's impairments (multi-system involvement) PT goals addressed during session: Mobility/safety with mobility OT goals addressed during session: ADL's and self-care;Proper use of Adaptive equipment and DME      AM-PAC PT "6 Clicks" Mobility   Outcome Measure  Help needed turning from your back to your side while in a flat bed without using bedrails?: A Little Help needed moving from lying on your back to sitting on the side of a  flat bed without using bedrails?: A Lot Help needed moving to and from a bed to a chair (including a wheelchair)?: Total Help needed standing up from a chair using your arms (e.g., wheelchair or bedside chair)?: Total Help needed to walk in hospital room?: Total Help needed climbing 3-5 steps with a railing? : Total 6 Click Score: 9    End of Session   Activity Tolerance: Patient tolerated treatment well Patient left: in bed;with call bell/phone within reach;with bed alarm set Nurse Communication: Mobility status PT Visit Diagnosis: Unsteadiness on feet (R26.81);Muscle weakness (generalized) (M62.81);Difficulty in walking, not elsewhere classified (R26.2);Pain     Time: 0347-4259 PT Time Calculation (min) (ACUTE ONLY): 43 min  Charges:    $Therapeutic Activity: 23-37 mins PT General Charges $$ ACUTE PT VISIT: 1 Visit                     Olga Coaster PT, DPT 12:22 PM,07/04/23

## 2023-07-04 NOTE — Progress Notes (Signed)
Progress Note   Patient: Suzanne Dixon ZOX:[id number] DOB: 05-Oct-1950 DOA: 06/29/2023     0 DOS: the patient was seen and examined on 07/04/2023   Brief hospital course:   IVERNA FENRICH is a 72 y.o. female with medical history significant of recurrent UTI, Parkinson's disease, hypertension, OSA on CPAP, atrial fibrillation on Warfarin, lymphedema, degenerative disc disease, breast cancer s/p chemo mastectomy and radiation, hypothyroidism, neuropathy, gout, who presents to the ED due to back pain. Mrs. Cerino states that she has been experiencing recurrent UTIs for the last few months.  She is currently on her third course of antibiotics that she started on the evening of 10/07.  She notes that her dysuria has resolved but she still experiencing some urgency although it is much improved.  She denies any persistent abdominal pain, stating she occasionally experiences some right-sided abdominal pain. Mrs. Arline states what actually brought her in today was her back pain.  She states that when she climbed up on the CT scanner on 10/02, she had sudden sharp pain in her back that radiated bilaterally around to her groin and down her legs.  The pain has persisted since then, however in the last 24 hours, her pain seemed to have worsened.  When she tries to stand up, she feels weak and as though she is going to fall.  Due to this, she stated in her wheelchair all night.  She notes that she lives alone and is quite worried about suffering a fall.  She denies any other back pain or leg pain.  She denies any urinary or fecal incontinence.  No paresthesias.    Patient states she uses a wheelchair at home but was able to take some steps on her own before.    ED course: On arrival to the ED, patient was hypertensive at 156/98 with heart rate of 68.  She was saturating at 100% on room air.  She was afebrile at 97.7.Initial workup notable for normal CBC, BMP, CK.  Urinalysis with hematuria, ketonuria, leukocytes,  and increased WBC/hpf.  CT renal stone study was obtained with no acute findings within the abdomen or pelvis with bilateral nonobstructive kidney stones.  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  No acute findings.  Patient started on ceftriaxone  MRI showed : Multilevel lumbar spondylosis, worst at L3-4, where there is severe spinal canal stenosis and moderate bilateral neural foraminal narrowing. Moderate spinal canal stenosis at T11-12 and L2-3. Moderate to severe neural foraminal narrowing at multiple additional levels, as detailed above.  10/12 : Patient symptomatically feeling better with no acute event.  Evaluated by PT recommends rehab patient initially wanted to go home however agrees to a certain rehab facility.  Patient pending rehab placement.  10/13 : Patient wants to be placed at rehab facility pending placement  10/14-15: stable pending placement  Assessment and Plan: * Acute back pain secondary to spinal spinal stenosis Patient reporting back pain since having a CT scan on 10/02 where she believes she may have pulled a muscle.  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  Like acute exacerbation with low suspicion for spinal process. MRI shows lumbar spondylosis with severe canal stnosis and neural foraminal narrowing, no cord compression. PT advising SNF - TOC working on snf - continue pain control, bowel regimen, PT - consider outpt neurosurg f/u  Acute cystitis Patient has a history of chronic UTIs  and has been on numerous rounds over the last few months. Most recent culture from 06/10/2023 demonstrated Klebsiella pneumoniae with resistant to Ampicillin, Bactrim, and Macrobid. She has been on Cefdinir since 10/07 with improvement in symptoms.  Culture negative and now 7 days of abx so have discontinued  AF (paroxysmal atrial fibrillation)   Stable - continue metop - Warfarin per pharmacy dosing  Parkinsonism (HCC) - Continue home regimen  Hypothyroidism - Continue home regimen  Large granular lymphocytic leukemia  Follows with oncology, currently under surveillance only.  No evidence of osseous lesion seen on CT imaging.  Apnea, sleep - CPAP at bedtime  Hypertension BPs elevated since we decreased metop dose - will add amlodipine      Subjective: Patient seen and examined in the morning rounds.  Pain controlled, bm last night, worked with PT today  Physical Exam: Vitals:   07/03/23 1643 07/03/23 1948 07/04/23 0509 07/04/23 0810  BP: (!) 145/80 (!) 135/95 (!) 148/98 (!) 164/94  Pulse: 84 80 74 72  Resp: 20   16  Temp: 97.8 F (36.6 C) 98.3 F (36.8 C) (!) 97.5 F (36.4 C) 97.7 F (36.5 C)  TempSrc:  Oral Oral Oral  SpO2: 96% 100% 97% 98%  Weight:      Height:       NAD RRR CTAB Abdomen soft, non-tender Extremities warm, trace LE edema Moving all 4, no focal deficits   Data Reviewed:  There are no new results to review at this time.  Family Communication: None by bedside. No answer when either daughter called today.  Disposition: Status is: Observation The patient remains OBS appropriate and will d/c before 2 midnights.  Planned Discharge Destination: Skilled nursing facility    Time spent: 35 minutes   Author: Silvano Bilis, MD 07/04/2023 1:12 PM  For on call review [email or web address].

## 2023-07-04 NOTE — Plan of Care (Signed)
  Problem: Education: Goal: Knowledge of General Education information will improve Description Including pain rating scale, medication(s)/side effects and non-pharmacologic comfort measures Outcome: Progressing   

## 2023-07-04 NOTE — TOC Progression Note (Signed)
Transition of Care [facility]) - Progression Note    Patient Details  Name: Suzanne Dixon MRN: [id number] Date of Birth: 09-05-1951  Transition of Care [facility]) CM/SW Contact  Garret Reddish, RN Phone Number: 07/04/2023, 1:12 PM  Clinical Narrative:    Chart reviewed.  Patient only has one bed offer.  I have informed patient of that bed offer.  She would like for me to fax out to [place] area facilities again.  I have informed her of the facilities that are not in network with her insurance.  I have faxed out to [facility] and I have asked [facility] and [facility] to look at patient as referrals are pending in the hub.  TOC will continue to follow for discharge planning.          Expected Discharge Plan and Services                                               Social Determinants of Health (SDOH) Interventions SDOH Screenings   Food Insecurity: Food Insecurity Present (06/29/2023)  Housing: Low Risk  (06/29/2023)  Transportation Needs: No Transportation Needs (06/29/2023)  Recent Concern: Transportation Needs - Unmet Transportation Needs (05/02/2023)   Received from [facility]  Utilities: Not At Risk (06/29/2023)  Financial Resource Strain: Low Risk  (06/09/2023)   Received from [facility]  Recent Concern: Financial Resource Strain - High Risk (05/02/2023)   Received from [facility]  Physical Activity: Insufficiently Active (05/02/2023)   Received from [facility]  Social Connections: Moderately Isolated (05/02/2023)   Received from [facility]  Stress: Stress Concern Present (05/02/2023)   Received from [facility]  Tobacco Use: Low Risk  (06/29/2023)  Health Literacy: Inadequate Health Literacy (05/02/2023)   Received from [facility]    Readmission Risk Interventions    06/21/2021    4:12 PM  Readmission Risk  Prevention Plan  Transportation Screening Complete  PCP or Specialist Appt within 3-5 Days Complete  HRI or Home Care Consult Complete  Social Work Consult for Recovery Care Planning/Counseling Complete  Palliative Care Screening Not Applicable  Medication Review [employer]) Complete

## 2023-07-04 NOTE — Progress Notes (Signed)
Occupational Therapy Treatment Patient Details Name: Suzanne Dixon MRN: [id number] DOB: 04/12/1951 Today's Date: 07/04/2023   History of present illness 72 y/o female presented to ED on 06/29/23 for back pain. MRI showed multilevel lumbar spondylosis (worst at L3-4) with severe spinal canal stenosis and moderate bilateral neural foraminal. PMH: paroxysmal atrial fibrillation, hypertension, prior CVA, obesity, hx of breast cancer s/p chemo, mastectomy,and radiation, neuropathy, lymphedema, parkinson's disease   OT comments  Chart reviewed to date, pt greeted in room with PT present, performing vestibular testing. Tx session targeted improving functional activity tolerance in prep for ADL tasks. Skilled +2 provided for bed mobility tasks. Pt requires MAX A for peri care after incontinent BM. Pt defers further out of bed tasks due to fatigue. Pt is making progress towards goals, discharge recommendation remains appropriate.       If plan is discharge home, recommend the following:  A lot of help with bathing/dressing/bathroom;Assistance with cooking/housework;A lot of help with walking and/or transfers;Help with stairs or ramp for entrance   Equipment Recommendations  Other (comment) (per next venue of care)    Recommendations for Other Services      Precautions / Restrictions Precautions Precautions: Fall Restrictions Weight Bearing Restrictions: No       Mobility Bed Mobility Overal bed mobility: Needs Assistance Bed Mobility: Supine to Sit, Sit to Supine, Rolling Rolling: Mod assist, +2 for physical assistance   Supine to sit: Min assist, Mod assist, +2 for physical assistance Sit to supine: Min assist, Mod assist, +2 for physical assistance   General bed mobility comments: supine>long sit with MOD A +2    Transfers                   General transfer comment: deferred due to pt fatigue     Balance Overall balance assessment: History of Falls, Needs  assistance Sitting-balance support: Feet supported, No upper extremity supported Sitting balance-Leahy Scale: Fair Sitting balance - Comments: long sit in bed with no posterior support                                   ADL either performed or assessed with clinical judgement   ADL Overall ADL's : Needs assistance/impaired     Grooming: Set up                       Toileting- Clothing Manipulation and Hygiene: Total assistance;Bed level Toileting - Clothing Manipulation Details (indicate cue type and reason): incontient BM            Extremity/Trunk Assessment              Vision       Perception     Praxis      Cognition Arousal: Alert Behavior During Therapy: WFL for tasks assessed/performed Overall Cognitive Status: Within Functional Limits for tasks assessed                                          Exercises Other Exercises Other Exercises: edu re: importance of continued porgression of mobility    Shoulder Instructions       General Comments breakdown noted along skin folds with interdry fabric noted where breakdown is located;    Pertinent Vitals/ Pain       Pain Assessment  Pain Assessment: 0-10 Pain Score: 7  Pain Location: R hip, groan Pain Descriptors / Indicators: Burning, Grimacing Pain Intervention(s): Limited activity within patient's tolerance, Monitored during session, Repositioned  Home Living                                          Prior Functioning/Environment              Frequency  Min 1X/week        Progress Toward Goals  OT Goals(current goals can now be found in the care plan section)  Progress towards OT goals: Progressing toward goals     Plan      Co-evaluation    PT/OT/SLP Co-Evaluation/Treatment: Yes Reason for Co-Treatment: To address functional/ADL transfers;Complexity of the patient's impairments (multi-system involvement) PT goals  addressed during session: Mobility/safety with mobility OT goals addressed during session: ADL's and self-care;Proper use of Adaptive equipment and DME      AM-PAC OT "6 Clicks" Daily Activity     Outcome Measure   Help from another person eating meals?: None Help from another person taking care of personal grooming?: A Little Help from another person toileting, which includes using toliet, bedpan, or urinal?: A Lot Help from another person bathing (including washing, rinsing, drying)?: A Lot Help from another person to put on and taking off regular upper body clothing?: A Little Help from another person to put on and taking off regular lower body clothing?: A Lot 6 Click Score: 16    End of Session    OT Visit Diagnosis: Unsteadiness on feet (R26.81);Other abnormalities of gait and mobility (R26.89);Repeated falls (R29.6);Muscle weakness (generalized) (M62.81)   Activity Tolerance Patient limited by fatigue   Patient Left in bed;with call bell/phone within reach;with bed alarm set   Nurse Communication          Time: 1000-1016 OT Time Calculation (min): 16 min  Charges: OT General Charges $OT Visit: 1 Visit OT Treatments $Therapeutic Activity: 8-22 mins  Oleta Mouse, OTD OTR/L  07/04/23, 12:58 PM

## 2023-07-04 NOTE — Consult Note (Signed)
Pharmacy Consult Note - Anticoagulation  Pharmacy Consult for warfarin Indication: atrial fibrillation  PATIENT MEASUREMENTS: Height: 5\' 4"  (162.6 cm) Weight: 106 kg (233 lb 11 oz) IBW/kg (Calculated) : 54.7 HEPARIN DW (KG): 79.7  VITAL SIGNS: Temp: 97.5 F (36.4 C) (10/15 0509) Temp Source: Oral (10/15 0509) BP: 148/98 (10/15 0509) Pulse Rate: 74 (10/15 0509)  Recent Labs    07/03/23 0430 07/04/23 0601  HGB 12.8  --   HCT 37.7  --   PLT 225  --   LABPROT 23.7* 24.4*  INR 2.1* 2.2*    Estimated Creatinine Clearance: 73.2 mL/min (by C-G formula based on SCr of 0.65 mg/dL).  PAST MEDICAL HISTORY: Past Medical History:  Diagnosis Date   A-fib (HCC)    Anemia    B12 deficiency    Breast cancer (HCC) 03/31/2013   Right - chemo- mastectomy   Breast cancer (HCC) 1991   Rt.- radiation   Complication of anesthesia 01/2009   Recent years with general anesthesia had itching following surgery   DDD (degenerative disc disease), cervical    Depression    Diverticulitis    Dyspnea    Edema of both legs    GERD (gastroesophageal reflux disease)    Gout    H/O mastectomy 2014   per patient   H/O: cesarean section 1987   per patient report   H/O: hysterectomy 1996   per patient   Hydradenitis    Hypertension    Hypothyroidism    Neuropathy    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HCC)    Parkinson's disease (HCC)    Personal history of chemotherapy    Personal history of radiation therapy    Ruptured cervical disc 2002   per patient    Sleep apnea    cpap machine   Super obese    Tachycardia     ASSESSMENT: 72 y.o. female with PMH Afib (on warfarin), HTN, breast cancer s/p mastectomy and chemoradiation, recurrent UTIs is presenting with acute lower back pain. Patient is on chronic anticoagulation per chart review. Pharmacy has been consulted to initiate and manage warfarin.  Pertinent medications: Home warfarin regimen  Warfarin 6 mg PO once daily on Tue, Wed, Thu, Sat,  Sun  Warfarin 3 mg PO once daily on Mon and Fri Last warfarin dose PTA was evening of 10/9 Patient has 6 mg and 1 mg tablets at home  Goal(s) of therapy: INR 2 - 3 Monitor platelets by anticoagulation protocol: Yes   Baseline anticoagulation labs: Recent Labs    07/02/23 0616 07/03/23 0430 07/04/23 0601  INR 2.2* 2.1* 2.2*  HGB  --  12.8  --   PLT  --  225  --    Date INR Warfarin Dose  10/10 3.2 HELD  10/11 3.1 1 mg (ordered)  10/12 2.8 3 mg  10/13 2.2 6 mg  10/14 2.1 3 mg  10/15 2.2 6 mg   No signs/symptoms of bleeding reported. CBC WNL  PLAN: INR therapeutic today Will give warfarin 6 mg x 1 tonight (home dose) Re-assess INR with AM labs.  Rockwell Alexandria, PharmD Clinical Pharmacist 07/04/2023 7:34 AM

## 2023-07-04 NOTE — Progress Notes (Signed)
Nutrition Brief Note  Patient identified on the Malnutrition Screening Tool (MST) Report  Wt Readings from Last 15 Encounters:  06/29/23 106 kg  01/13/23 98 kg  06/21/22 100.8 kg  05/21/22 98.1 kg  10/20/21 104.8 kg  07/01/21 116.8 kg  05/25/21 122.5 kg  04/19/21 122.5 kg  03/19/21 123.8 kg  02/16/21 125.6 kg  11/26/20 130.2 kg  07/17/20 128.4 kg  03/05/20 130.5 kg  01/15/20 135.1 kg  12/31/19 134.3 kg   Pt with medical history significant of recurrent UTI, Parkinson's disease, hypertension, OSA on CPAP, atrial fibrillation on Warfarin, lymphedema, degenerative disc disease, breast cancer s/p chemo mastectomy and radiation, hypothyroidism, neuropathy, gout, who presents due to back pain.  Pt admitted with parkinson's, back pain, and spinal stenosis.   Reviewed I/O's: -910 ml x 24 hours and +6.9 L since admission  UOP: 1.2 L x 24 hours  Pt receiving nursing care at time of visit.   Pt with good appetite. Noted meal completions 90-100%.   Per [facility] notes, pt with IAD to abdominal skin folds.   Nutrition-Focused physical exam completed. Findings are no fat depletion, no muscle depletion, and mild edema.    Medications reviewed and include calcium with vita,min D, sinemet, vitamin B-12, neurontin, and miralax.   Labs reviewed: CBGS: 198.    Body mass index is 40.11 kg/m. Patient meets criteria for obesity, class I based on current BMI.   Current diet order is regular, patient is consuming approximately 90-100% of meals at this time. Labs and medications reviewed.   No nutrition interventions warranted at this time. If nutrition issues arise, please consult RD.   Levada Schilling, RD, LDN, CDCES Registered Dietitian III Certified Diabetes Care and Education Specialist Please refer to [facility] for RD and/or RD on-call/weekend/after hours pager

## 2023-07-05 ENCOUNTER — Observation Stay: Payer: Medicare HMO

## 2023-07-05 DIAGNOSIS — I480 Paroxysmal atrial fibrillation: Secondary | ICD-10-CM | POA: Diagnosis not present

## 2023-07-05 DIAGNOSIS — M4714 Other spondylosis with myelopathy, thoracic region: Secondary | ICD-10-CM | POA: Diagnosis not present

## 2023-07-05 DIAGNOSIS — M545 Low back pain, unspecified: Secondary | ICD-10-CM | POA: Diagnosis not present

## 2023-07-05 DIAGNOSIS — R29898 Other symptoms and signs involving the musculoskeletal system: Secondary | ICD-10-CM | POA: Diagnosis not present

## 2023-07-05 DIAGNOSIS — C91Z0 Other lymphoid leukemia not having achieved remission: Secondary | ICD-10-CM | POA: Diagnosis not present

## 2023-07-05 DIAGNOSIS — M4804 Spinal stenosis, thoracic region: Secondary | ICD-10-CM | POA: Diagnosis not present

## 2023-07-05 LAB — BASIC METABOLIC PANEL
Anion gap: 8 (ref 5–15)
BUN: 22 mg/dL (ref 8–23)
CO2: 27 mmol/L (ref 22–32)
Calcium: 8.2 mg/dL — ABNORMAL LOW (ref 8.9–10.3)
Chloride: 101 mmol/L (ref 98–111)
Creatinine, Ser: 0.62 mg/dL (ref 0.44–1.00)
GFR, Estimated: >60 mL/min (ref >60)
Glucose, Bld: 90 mg/dL (ref 70–99)
Potassium: 4.3 mmol/L (ref 3.5–5.1)
Sodium: 136 mmol/L (ref 135–145)

## 2023-07-05 LAB — PROTIME-INR
INR: 2.1 — ABNORMAL HIGH (ref 0.8–1.2)
Prothrombin Time: 23.6 s — ABNORMAL HIGH (ref 11.4–15.2)

## 2023-07-05 MED ORDER — WARFARIN SODIUM 6 MG PO TABS
6.0000 mg | ORAL_TABLET | Freq: Once | ORAL | Status: AC
  Administered 2023-07-05: 6 mg via ORAL
  Filled 2023-07-05: qty 1

## 2023-07-05 NOTE — H&P (View-Only) (Signed)
Consulting Department:  Internal medicine  Primary Physician:  Lynnea Ferrier, MD  Chief Complaint: Right lower extremity weakness  History of Present Illness: 07/05/2023 Suzanne Dixon is a 72 y.o. female who presents with the chief complaint of right lower extremity weakness.  She states that she was in her usual state of health until 06/21/2023 when she had to lay flat for procedure.  She states that she felt a pop and had excruciating pain in the bilateral lower extremities with loss of sensation.  She then felt progressive worsening weakness in her right lower extremity.  At this point she states that some of the weakness in her left lower extremity has improved but her right lower extremity continues to be quite weak.  Notably she did have a significant amount of weakness starting 2 years ago secondary to COVID infection, however working with PT she was able to get back up to walking multiple steps with her walker and hand supports.  MRI of the lumbar spine was evaluated and found to have some stenosis, notably there was some more proximal weakness on her exam so and thoracic MRI was ordered.  This showed severe stenosis.  She does state that she has some radiating rib pain and loss of sensation in her right worse than lower extremity.  She is unsure of any bowel or bladder incontinence, says that sometimes she cannot make it to the restroom because of her difficulty with ambulation.  The symptoms are causing a significant impact on the patient's life.   Review of Systems:  A 10 point review of systems is negative, except for the pertinent positives and negatives detailed in the HPI.  Past Medical History: Past Medical History:  Diagnosis Date   A-fib (HCC)    Anemia    B12 deficiency    Breast cancer (HCC) 03/31/2013   Right - chemo- mastectomy   Breast cancer (HCC) 1991   Rt.- radiation   Complication of anesthesia 01/2009   Recent years with general anesthesia had itching  following surgery   DDD (degenerative disc disease), cervical    Depression    Diverticulitis    Dyspnea    Edema of both legs    GERD (gastroesophageal reflux disease)    Gout    H/O mastectomy 2014   per patient   H/O: cesarean section 1987   per patient report   H/O: hysterectomy 1996   per patient   Hydradenitis    Hypertension    Hypothyroidism    Neuropathy    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HCC)    Parkinson's disease (HCC)    Personal history of chemotherapy    Personal history of radiation therapy    Ruptured cervical disc 2002   per patient    Sleep apnea    cpap machine   Super obese    Tachycardia     Past Surgical History: Past Surgical History:  Procedure Laterality Date   ABDOMINAL HYSTERECTOMY     BACK SURGERY  2002   plate and 2 screws in neck   BREAST BIOPSY Right 1991,2014   Positive   BREAST CYST ASPIRATION Left    BREAST SURGERY Right 2014   mastectomy   CATARACT EXTRACTION W/PHACO Left 05/17/2019   Procedure: CATARACT EXTRACTION PHACO AND INTRAOCULAR LENS PLACEMENT (IOC);  Surgeon: Nevada Crane, MD;  Location: [facility]S;  Service: Ophthalmology;  Laterality: Left;  [place] 00:28 CDE 2.15 FLUID PACK LOT # [id number]   CATARACT EXTRACTION W/PHACO  Right 06/14/2019   Procedure: CATARACT EXTRACTION PHACO AND INTRAOCULAR LENS PLACEMENT (IOC) RIGHT;  Surgeon: Nevada Crane, MD;  Location: [facility];  Service: Ophthalmology;  Laterality: Right;  [place] 00:35.0 CDE 2.37 Fluid Pack Lot #[id number]   CESAREAN SECTION     CHOLECYSTECTOMY  04/09/2012   COLONOSCOPY WITH PROPOFOL N/A 06/22/2015   Procedure: COLONOSCOPY WITH PROPOFOL;  Surgeon: Scot Jun, MD;  Location: [facility] ENDOSCOPY;  Service: Endoscopy;  Laterality: N/A;   COLONOSCOPY WITH PROPOFOL N/A 05/25/2021   Procedure: COLONOSCOPY WITH PROPOFOL;  Surgeon: Regis Bill, MD;  Location: [facility] ENDOSCOPY;  Service: Endoscopy;  Laterality: N/A;   ESOPHAGOGASTRODUODENOSCOPY     ESOPHAGOGASTRODUODENOSCOPY  N/A 05/25/2021   Procedure: ESOPHAGOGASTRODUODENOSCOPY (EGD);  Surgeon: Regis Bill, MD;  Location: [facility] ENDOSCOPY;  Service: Endoscopy;  Laterality: N/A;   EYE SURGERY     LAPAROSCOPIC GASTRIC BYPASS     MASTECTOMY Right    MASTECTOMY MODIFIED RADICAL     mastectomy partial      lumpectomy   NEUROPLASTY / TRANSPOSITION ULNAR NERVE AT ELBOW     OOPHORECTOMY     PORT-A-CATH REMOVAL Left 05/13/2015   Procedure: REMOVAL PORT-A-CATH LEFT CHEST ;  Surgeon: Lattie Haw, MD;  Location: [facility]S;  Service: General;  Laterality: Left;   REDUCTION MAMMAPLASTY Left    REVERSE SHOULDER ARTHROPLASTY Right 12/03/2020   Procedure: REVERSE SHOULDER ARTHROPLASTY;  Surgeon: Christena Flake, MD;  Location: [facility];  Service: Orthopedics;  Laterality: Right;   ROUX-EN-Y GASTRIC BYPASS  01/26/2009   SHOULDER ARTHROSCOPY WITH OPEN ROTATOR CUFF REPAIR Right 01/03/2017   Procedure: SHOULDER ARTHROSCOPY WITH OPEN ROTATOR CUFF REPAIR;  Surgeon: Christena Flake, MD;  Location: [facility];  Service: Orthopedics;  Laterality: Right;   SHOULDER ARTHROSCOPY WITH SUBACROMIAL DECOMPRESSION, ROTATOR CUFF REPAIR AND BICEP TENDON REPAIR Right 01/03/2017   Procedure: SHOULDER ARTHROSCOPY WITH SUBACROMIAL DECOMPRESSION, ROTATOR CUFF REPAIR AND BICEP TENDON REPAIR;  Surgeon: Christena Flake, MD;  Location: [facility];  Service: Orthopedics;  Laterality: Right;  Limited debridement   SPINE SURGERY      Allergies: Allergies as of 06/29/2023 - Review Complete 06/29/2023  Allergen Reaction Noted   Advair diskus [fluticasone-salmeterol] Other (See Comments) 06/19/2015   Alendronate  05/13/2019   Singulair [montelukast] Other (See Comments) 01/19/2015   Sulfa antibiotics Hives 01/19/2015   Venlafaxine Other (See Comments) 01/19/2015    Medications:  Current Facility-Administered Medications:    acetaminophen (TYLENOL) tablet 650 mg, 650 mg, Oral, Q6H, Verdene Lennert, MD, 650 mg at 07/05/23 1329   allopurinol (ZYLOPRIM) tablet  300 mg, 300 mg, Oral, Daily, Verdene Lennert, MD, 300 mg at 07/05/23 0842   amLODipine (NORVASC) tablet 10 mg, 10 mg, Oral, Daily, Wouk, Wilfred Curtis, MD, 10 mg at 07/05/23 4540   calcium-vitamin D (OSCAL WITH D) 500-5 MG-MCG per tablet, , Oral, BID WC, Verdene Lennert, MD, 2 tablet at 07/05/23 0841   carbidopa-levodopa (SINEMET IR) 25-100 MG per tablet immediate release 2 tablet, 2 tablet, Oral, TID, Verdene Lennert, MD, 2 tablet at 07/05/23 9811   cyanocobalamin (VITAMIN B12) tablet 1,000 mcg, 1,000 mcg, Oral, Daily, Verdene Lennert, MD, 1,000 mcg at 07/05/23 0842   DULoxetine (CYMBALTA) DR capsule 60 mg, 60 mg, Oral, BID, Verdene Lennert, MD, 60 mg at 07/05/23 0842   feeding supplement (ENSURE ENLIVE / ENSURE PLUS) liquid 237 mL, 237 mL, Oral, BID BM, Verdene Lennert, MD, 237 mL at 07/05/23 0843   gabapentin (NEURONTIN) capsule 300 mg, 300 mg, Oral, QID, Basaraba,  Cira Servant, MD, 300 mg at 07/05/23 1329   HYDROcodone-acetaminophen (NORCO/VICODIN) 5-325 MG per tablet 1 tablet, 1 tablet, Oral, Q6H PRN, Verdene Lennert, MD   levothyroxine (SYNTHROID) tablet 50 mcg, 50 mcg, Oral, QAC breakfast, Verdene Lennert, MD, 50 mcg at 07/05/23 0528   lidocaine (LIDODERM) 5 % 1 patch, 1 patch, Transdermal, Q24H, Chesley Noon, MD, 1 patch at 07/05/23 0843   magnesium oxide (MAG-OX) tablet 400 mg, 400 mg, Oral, Daily, Verdene Lennert, MD, 400 mg at 07/05/23 0841   meloxicam (MOBIC) tablet 15 mg, 15 mg, Oral, Daily, Verdene Lennert, MD, 15 mg at 07/05/23 0841   methocarbamol (ROBAXIN) tablet 500 mg, 500 mg, Oral, Q6H PRN, Verdene Lennert, MD, 500 mg at 07/04/23 0608   metoprolol tartrate (LOPRESSOR) tablet 25 mg, 25 mg, Oral, BID, Verdene Lennert, MD, 25 mg at 07/05/23 0841   ondansetron (ZOFRAN) tablet 4 mg, 4 mg, Oral, Q6H PRN **OR** ondansetron (ZOFRAN) injection 4 mg, 4 mg, Intravenous, Q6H PRN, Verdene Lennert, MD   polyethylene glycol (MIRALAX / GLYCOLAX) packet 17 g, 17 g, Oral, Daily PRN, Verdene Lennert, MD,  17 g at 07/02/23 1202   polyethylene glycol (MIRALAX / GLYCOLAX) packet 17 g, 17 g, Oral, Daily, Wouk, Wilfred Curtis, MD, 17 g at 07/05/23 4098   simethicone (MYLICON) chewable tablet 80 mg, 80 mg, Oral, Q6H PRN, Wouk, Wilfred Curtis, MD   warfarin (COUMADIN) tablet 6 mg, 6 mg, Oral, ONCE-1600, [place], [facility]   Warfarin - Pharmacist Dosing Inpatient, , Does not apply, q1600, Verdene Lennert, MD, Given at 07/04/23 1700   Social History: Social History   Tobacco Use   Smoking status: Never   Smokeless tobacco: Never  Substance Use Topics   Alcohol use: No    Alcohol/week: 0.0 standard drinks of alcohol   Drug use: No    Family Medical History: Family History  Problem Relation Age of Onset   Hypertension Mother    Lymphoma Mother    Cancer Sister        Breast   Breast cancer Sister 29    Physical Examination: Vitals:   07/05/23 0749 07/05/23 1514  BP: 122/87 101/72  Pulse: 77 86  Resp: 16 16  Temp: (!) 97.4 F (36.3 C) 97.8 F (36.6 C)  SpO2:       General: Patient is well developed, well nourished, calm, collected, and in no apparent distress.  NEUROLOGICAL:  General: In no acute distress.   Awake, alert, oriented to person, place, and time.  Pupils equal round and reactive to light.  Facial tone is symmetric.  Tongue protrusion is midline.  There is no pronator drift.  Strength: Side Biceps Triceps Deltoid Interossei Grip Wrist Ext. Wrist Flex.  R 5 5 5 5 5 5 5   L 5 5 5 5 5 5 5    Side Iliopsoas Quads Hamstring PF DF EHL  R 1 1-2 1-0 3 2 3   L 3 4 4  4+ 4+ 4+    T10 Sensory level, decreased propioception on the right lower extremity.   Reflexes are decreased and symmetric at the biceps, triceps, brachioradialis, patella and achilles. Hoffman's is absent. Clonus is not present.  Toes are mute.  Imaging:   Narrative & Impression  CLINICAL DATA:  Myelopathy, acute, lumbar spine. Worsening back pain.   EXAM: MRI LUMBAR SPINE WITHOUT CONTRAST    TECHNIQUE: Multiplanar, multisequence MR imaging of the lumbar spine was performed. No intravenous contrast was administered.   COMPARISON:  Lumbar spine CT 06/21/2023.  FINDINGS: Segmentation: Conventional numbering is assumed with 5 non-rib-bearing, lumbar type vertebral bodies. Rudimentary disc at S1-2.   Alignment:  Normal.   Vertebrae: Modic type 2 degenerative endplate marrow signal changes at L3-4.   Conus medullaris and cauda equina: Conus extends to the L1-2 level. Conus and cauda equina appear normal.   Paraspinal and other soft tissues: Moderate fatty atrophy of the paraspinal muscles.   Disc levels:   T11-12: Sagittal images only. Disc bulge and facet arthropathy results in moderate spinal canal stenosis and moderate bilateral neural foraminal narrowing.   T12-L1:  Moderate bilateral facet arthropathy.   L1-L2: Disc bulge and moderate bilateral facet arthropathy results in mild spinal canal stenosis, severe left and moderate right neural foraminal narrowing.   L2-L3: Disc bulge and severe bilateral facet arthropathy results in moderate spinal canal stenosis and moderate bilateral neural foraminal narrowing.   L3-L4: Endplate osteophytes and facet arthropathy results in severe spinal canal stenosis and moderate bilateral neural foraminal narrowing.   L4-L5: No disc herniation or spinal canal stenosis. Severe bilateral facet arthropathy results in moderate bilateral neural foraminal narrowing.   L5-S1: No disc herniation or spinal canal stenosis. Facet arthropathy results in moderate bilateral neural foraminal narrowing.   IMPRESSION: 1. Multilevel lumbar spondylosis, worst at L3-4, where there is severe spinal canal stenosis and moderate bilateral neural foraminal narrowing. 2. Moderate spinal canal stenosis at T11-12 and L2-3. 3. Moderate to severe neural foraminal narrowing at multiple additional levels, as detailed above.     Electronically  Signed   By: Orvan Falconer M.D.   On: 06/29/2023 16:12     CLINICAL DATA:  Spinal stenosis with neurologic changes.   EXAM: MRI THORACIC SPINE WITHOUT CONTRAST   TECHNIQUE: Multiplanar, multisequence MR imaging of the thoracic spine was performed. No intravenous contrast was administered.   COMPARISON:  CTA chest 06/19/2021   FINDINGS: Alignment: There is exaggerated thoracic kyphosis. There is trace anterolisthesis of C7 on T1 and trace retrolisthesis of T9 on T10.   Vertebrae: Vertebral body heights are preserved. Background marrow signal is normal. There is no suspicious marrow signal abnormality or marrow edema.   Cord: There is cord compression at C3-C4 (incompletely evaluated), and T9-T10. There is no definite cord signal abnormality at T9-T10. Cord signal at C3-C4 is not evaluated.   Paraspinal and other soft tissues: A left renal cyst is noted requiring no specific imaging follow-up. The paraspinal soft tissues are unremarkable.   Disc levels:   The cervical spine is imaged only with the sagittal T1 count sequence. There is severe spinal canal stenosis with cord compression at C3-C4 due to a posterior disc osteophyte complex and facet arthropathy. There is no other evidence of high-grade spinal canal stenosis in the cervical spine.   There is multilevel disc desiccation and narrowing in the mid and lower thoracic spine, most advanced at T7-T8 and T8-T9. There are small disc protrusions at C7-T1 and C5-C6, and mild disc bulges at T6-T7 through T8-T9 without significant spinal canal stenosis.   There is overall mild facet arthropathy at T8-T9 and above resulting in up to mild-to-moderate bilateral neural foraminal stenosis at T8-T9.   At T9-T10, there is trace retrolisthesis with disc bulge and moderate facet arthropathy with ligamentum flavum thickening resulting in severe spinal canal stenosis with cord compression, and severe bilateral neural foraminal  stenosis.   IMPRESSION: 1. The cervical spine is included on the sagittal T1 count sequence. There is severe spinal canal stenosis with cord compression at C3-C4  due to a posterior disc osteophyte complex and facet arthropathy. Cord signal abnormality can not be assessed at this level. Consider dedicated cervical spine MRI as indicated. 2. Severe spinal canal stenosis and severe bilateral neural foraminal stenosis at T9-T10 due to grade 1 retrolisthesis, a disc bulge, and moderate facet arthropathy without definite cord signal abnormality. 3. Overall mild degenerative changes at the other levels without other high-grade spinal canal or neural foraminal stenosis.     Electronically Signed   By: Lesia Hausen M.D.   On: 07/05/2023 19:35 I have personally reviewed the images and agree with the above interpretation.   Notably, there is discordance in the reads between the MR thoracic and MRI lumbar spine.  When compared to the CT scan lumbar spine and thoracic spine.  The area of spondylosis/severe compression appears to be at the last rib-bearing level and above.  Based off of the ribs this would be T11/T12.   Labs:    Latest Ref Rng & Units 07/03/2023    4:30 AM 06/30/2023    4:45 AM 06/29/2023   10:23 AM  CBC  WBC 4.0 - 10.5 K/uL 6.8  5.6  5.3   Hemoglobin 12.0 - 15.0 g/dL 78.4  69.6  29.5   Hematocrit 36.0 - 46.0 % 37.7  38.0  39.2   Platelets 150 - 400 K/uL 225  222  235        Assessment and Plan: Ms. Giller is a pleasant 72 y.o. female with history of progressive weakness over the past 2 weeks in the right lower extremity specifically.  She states that she had to lay flat for procedure and felt severe pain in her back as well as bilateral lower extremity weakness.  She says that thankfully her left lower extremity has improved somewhat but her right lower extremity continues to be quite weak.  She has a decreased sensation from approximately her umbilicus down.  She has  decreased proprioception on the right.  Her reflexes are mute throughout.  She has full strength in her bilateral upper extremities with no upper extremity symptoms.  In her lower extremities she has severe weakness as outlined above.  Her right lower extremity specifically is quite weak and not antigravity.  When reviewing her imaging there is some discordance between levels on the MRI of the lumbar spine and MRI of the thoracic spine.  When correlating this to the CT scan it looks like the area of severe stenosis in the thoracic spine is at the last rib-bearing level and above.  T11/T12.  Will plan on T11-12 laminectomy, transpedicular decompression on the right, and T11-T12 fusion with possible extension to L1.  I have discussed this with the patient.  She would like to do this when her daughter is present.  She would like to schedule the procedure for Friday.  Given the presentation of approximately 2 weeks ago of weakness we feel that it is okay for her to postpone the procedure for 1 more day in order for family to be present.   Lovenia Kim, MD/MSCR Dept. of Neurosurgery

## 2023-07-05 NOTE — Progress Notes (Signed)
OT Cancellation Note  Patient Details Name: Suzanne Dixon MRN: [id number] DOB: [birth year]   Cancelled Treatment:    Reason Eval/Treat Not Completed: Patient at procedure or test/ unavailable (OT Treatment attempted this afternoon. Pt. with the Neurosurgeon. Will reattempt at a later time or date.)  Olegario Messier, MS, OTR/L 07/05/2023, 4:37 PM

## 2023-07-05 NOTE — Plan of Care (Signed)

## 2023-07-05 NOTE — Consult Note (Signed)
Pharmacy Consult Note - Anticoagulation  Pharmacy Consult for warfarin Indication: atrial fibrillation  PATIENT MEASUREMENTS: Height: 5\' 4"  (162.6 cm) Weight: 106 kg (233 lb 11 oz) IBW/kg (Calculated) : 54.7 HEPARIN DW (KG): 79.7  VITAL SIGNS: Temp: 97.4 F (36.3 C) (10/16 0749) Temp Source: Oral (10/16 0443) BP: 122/87 (10/16 0749) Pulse Rate: 77 (10/16 0749)  Recent Labs    07/03/23 0430 07/04/23 0601 07/05/23 0340  HGB 12.8  --   --   HCT 37.7  --   --   PLT 225  --   --   LABPROT 23.7*   < > 23.6*  INR 2.1*   < > 2.1*  CREATININE  --   --  0.62   < > = values in this interval not displayed.    Estimated Creatinine Clearance: 73.2 mL/min (by C-G formula based on SCr of 0.62 mg/dL).  PAST MEDICAL HISTORY: Past Medical History:  Diagnosis Date   A-fib (HCC)    Anemia    B12 deficiency    Breast cancer (HCC) 03/31/2013   Right - chemo- mastectomy   Breast cancer (HCC) 1991   Rt.- radiation   Complication of anesthesia 01/2009   Recent years with general anesthesia had itching following surgery   DDD (degenerative disc disease), cervical    Depression    Diverticulitis    Dyspnea    Edema of both legs    GERD (gastroesophageal reflux disease)    Gout    H/O mastectomy 2014   per patient   H/O: cesarean section 1987   per patient report   H/O: hysterectomy 1996   per patient   Hydradenitis    Hypertension    Hypothyroidism    Neuropathy    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HCC)    Parkinson's disease (HCC)    Personal history of chemotherapy    Personal history of radiation therapy    Ruptured cervical disc 2002   per patient    Sleep apnea    cpap machine   Super obese    Tachycardia     ASSESSMENT: 72 y.o. female with PMH Afib (on warfarin), HTN, breast cancer s/p mastectomy and chemoradiation, recurrent UTIs is presenting with acute lower back pain. Patient is on chronic anticoagulation per chart review. Pharmacy has been consulted to initiate and  manage warfarin.  Pertinent medications: Home warfarin regimen  Warfarin 6 mg PO once daily on Tue, Wed, Thu, Sat, Sun  Warfarin 3 mg PO once daily on Mon and Fri Last warfarin dose PTA was evening of 10/9 Patient has 6 mg and 1 mg tablets at home  Goal(s) of therapy: INR 2 - 3 Monitor platelets by anticoagulation protocol: Yes   Baseline anticoagulation labs: Recent Labs    07/03/23 0430 07/04/23 0601 07/05/23 0340  INR 2.1* 2.2* 2.1*  HGB 12.8  --   --   PLT 225  --   --    Date INR Warfarin Dose  10/10 3.2 HELD  10/11 3.1 1 mg (ordered)  10/12 2.8 3 mg  10/13 2.2 6 mg  10/14 2.1 3 mg  10/15 2.2 6 mg  10/16 2.1 6 mg   PLAN: INR therapeutic today; no signs/symptoms of bleeding reported.  Will give warfarin 6 mg x 1 tonight (home dose) Re-assess INR and CBC with AM labs.  Rockwell Alexandria, PharmD Clinical Pharmacist 07/05/2023 7:56 AM

## 2023-07-05 NOTE — Hospital Course (Addendum)
Suzanne Dixon is a 72 y.o. female with medical history significant of recurrent UTI, Parkinson's disease, hypertension, OSA on CPAP, atrial fibrillation on Warfarin, lymphedema, degenerative disc disease, breast cancer s/p chemo mastectomy and radiation, hypothyroidism, neuropathy, gout, who presents to the ED due to back pain.  Lumbar MRI showed severe spinal stenosis on L3-L4.  Patient also has significant right leg weakness. Consulted neurosurgery, MRI of the thoracic spine was also performed, showed severe stenosis in T11-T12 which was responsible for leg weakness.  Surgery 10/18.  10/23.  Patient able to straight leg raise without a problem.  Not having much back pain at all. 10/24.  Neurosurgical team to evaluate wound today and take off wound vac.  If wound looks good, can send to rehab for strength training. Neurosurgery recommended honeycomb daily dressing change for her back.

## 2023-07-05 NOTE — Consult Note (Signed)
Consulting Department:  ***  Primary Physician:  Lynnea Ferrier, MD  Chief Complaint:  ***  History of Present Illness: 07/05/2023 Suzanne Dixon is a 72 y.o. female who presents with the chief complaint of ***  2years ago had bad walking due to covid, was doing therapy ever since. Was able to get up and walking steps with hand holds.   Going down ramps with support.   After oct 2 felt lke all of her leg function has been going down hill. Now tingling in her feet. When she got it at first the left leg was waek as well but has improved slightly.   Radiating right rib pain at the interestino between her abodmen and ribs.      Now major changes to bowel or bladder,   Duration: *** Location: *** Quality: *** Provoking: aggravated by *** Alleviating: made better by *** Weakness: ***none Timing: ***none Bowel/Bladder: no dysfunction  Daryll Drown [place] has ***no symptoms of cervical myelopathy.  Conservative measures: Physical therapy: *** Medications: *** Injections: ***  The symptoms are causing a significant impact on the patient's life.   Review of Systems:  A 10 point review of systems is negative, except for the pertinent positives and negatives detailed in the HPI.  Past Medical History: Past Medical History:  Diagnosis Date   A-fib (HCC)    Anemia    B12 deficiency    Breast cancer (HCC) 03/31/2013   Right - chemo- mastectomy   Breast cancer (HCC) 1991   Rt.- radiation   Complication of anesthesia 01/2009   Recent years with general anesthesia had itching following surgery   DDD (degenerative disc disease), cervical    Depression    Diverticulitis    Dyspnea    Edema of both legs    GERD (gastroesophageal reflux disease)    Gout    H/O mastectomy 2014   per patient   H/O: cesarean section 1987   per patient report   H/O: hysterectomy 1996   per patient   Hydradenitis    Hypertension    Hypothyroidism    Neuropathy    Paget disease of  breast (HCC)    Parkinson's disease (HCC)    Personal history of chemotherapy    Personal history of radiation therapy    Ruptured cervical disc 2002   per patient    Sleep apnea    cpap machine   Super obese    Tachycardia     Past Surgical History: Past Surgical History:  Procedure Laterality Date   ABDOMINAL HYSTERECTOMY     BACK SURGERY  2002   plate and 2 screws in neck   BREAST BIOPSY Right 1991,2014   Positive   BREAST CYST ASPIRATION Left    BREAST SURGERY Right 2014   mastectomy   CATARACT EXTRACTION W/PHACO Left 05/17/2019   Procedure: CATARACT EXTRACTION PHACO AND INTRAOCULAR LENS PLACEMENT (IOC);  Surgeon: Nevada Crane, MD;  Location: [facility];  Service: Ophthalmology;  Laterality: Left;  [place] 00:28 CDE 2.15 FLUID PACK LOT # [id number]   CATARACT EXTRACTION W/PHACO Right 06/14/2019   Procedure: CATARACT EXTRACTION PHACO AND INTRAOCULAR LENS PLACEMENT (IOC) RIGHT;  Surgeon: Nevada Crane, MD;  Location: [facility];  Service: Ophthalmology;  Laterality: Right;  [place] 00:35.0 CDE 2.37 Fluid Pack Lot #[id number]   CESAREAN SECTION     CHOLECYSTECTOMY  04/09/2012   COLONOSCOPY WITH PROPOFOL N/A 06/22/2015   Procedure: COLONOSCOPY WITH PROPOFOL;  Surgeon: Scot Jun, MD;  Location: [facility];  Service: Endoscopy;  Laterality: N/A;   COLONOSCOPY WITH PROPOFOL N/A 05/25/2021   Procedure: COLONOSCOPY WITH PROPOFOL;  Surgeon: Regis Bill, MD;  Location: [facility];  Service: Endoscopy;  Laterality: N/A;   ESOPHAGOGASTRODUODENOSCOPY     ESOPHAGOGASTRODUODENOSCOPY N/A 05/25/2021   Procedure: ESOPHAGOGASTRODUODENOSCOPY (EGD);  Surgeon: Regis Bill, MD;  Location: [facility];  Service: Endoscopy;  Laterality: N/A;   EYE SURGERY     LAPAROSCOPIC GASTRIC BYPASS     MASTECTOMY Right    MASTECTOMY MODIFIED RADICAL     mastectomy partial      lumpectomy   NEUROPLASTY / TRANSPOSITION ULNAR NERVE AT ELBOW     OOPHORECTOMY     PORT-A-CATH REMOVAL Left  05/13/2015   Procedure: REMOVAL PORT-A-CATH LEFT CHEST ;  Surgeon: Lattie Haw, MD;  Location: [facility];  Service: General;  Laterality: Left;   REDUCTION MAMMAPLASTY Left    REVERSE SHOULDER ARTHROPLASTY Right 12/03/2020   Procedure: REVERSE SHOULDER ARTHROPLASTY;  Surgeon: Christena Flake, MD;  Location: [facility];  Service: Orthopedics;  Laterality: Right;   ROUX-EN-Y GASTRIC BYPASS  01/26/2009   SHOULDER ARTHROSCOPY WITH OPEN ROTATOR CUFF REPAIR Right 01/03/2017   Procedure: SHOULDER ARTHROSCOPY WITH OPEN ROTATOR CUFF REPAIR;  Surgeon: Christena Flake, MD;  Location: [facility];  Service: Orthopedics;  Laterality: Right;   SHOULDER ARTHROSCOPY WITH SUBACROMIAL DECOMPRESSION, ROTATOR CUFF REPAIR AND BICEP TENDON REPAIR Right 01/03/2017   Procedure: SHOULDER ARTHROSCOPY WITH SUBACROMIAL DECOMPRESSION, ROTATOR CUFF REPAIR AND BICEP TENDON REPAIR;  Surgeon: Christena Flake, MD;  Location: [facility];  Service: Orthopedics;  Laterality: Right;  Limited debridement   SPINE SURGERY      Allergies: Allergies as of 06/29/2023 - Review Complete 06/29/2023  Allergen Reaction Noted   Advair diskus [fluticasone-salmeterol] Other (See Comments) 06/19/2015   Alendronate  05/13/2019   Singulair [montelukast] Other (See Comments) 01/19/2015   Sulfa antibiotics Hives 01/19/2015   Venlafaxine Other (See Comments) 01/19/2015    Medications:  Current Facility-Administered Medications:    acetaminophen (TYLENOL) tablet 650 mg, 650 mg, Oral, Q6H, Verdene Lennert, MD, 650 mg at 07/05/23 1329   allopurinol (ZYLOPRIM) tablet 300 mg, 300 mg, Oral, Daily, Verdene Lennert, MD, 300 mg at 07/05/23 0842   amLODipine (NORVASC) tablet 10 mg, 10 mg, Oral, Daily, Wouk, Wilfred Curtis, MD, 10 mg at 07/05/23 4098   calcium-vitamin D (OSCAL WITH D) 500-5 MG-MCG per tablet, , Oral, BID WC, Verdene Lennert, MD, 2 tablet at 07/05/23 0841   carbidopa-levodopa (SINEMET IR) 25-100 MG per tablet immediate release 2 tablet, 2 tablet, Oral,  TID, Verdene Lennert, MD, 2 tablet at 07/05/23 1191   cyanocobalamin (VITAMIN B12) tablet 1,000 mcg, 1,000 mcg, Oral, Daily, Verdene Lennert, MD, 1,000 mcg at 07/05/23 0842   DULoxetine (CYMBALTA) DR capsule 60 mg, 60 mg, Oral, BID, Verdene Lennert, MD, 60 mg at 07/05/23 0842   feeding supplement (ENSURE ENLIVE / ENSURE PLUS) liquid 237 mL, 237 mL, Oral, BID BM, Verdene Lennert, MD, 237 mL at 07/05/23 0843   gabapentin (NEURONTIN) capsule 300 mg, 300 mg, Oral, QID, Verdene Lennert, MD, 300 mg at 07/05/23 1329   HYDROcodone-acetaminophen (NORCO/VICODIN) 5-325 MG per tablet 1 tablet, 1 tablet, Oral, Q6H PRN, Verdene Lennert, MD   levothyroxine (SYNTHROID) tablet 50 mcg, 50 mcg, Oral, QAC breakfast, Verdene Lennert, MD, 50 mcg at 07/05/23 0528   lidocaine (LIDODERM) 5 % 1 patch, 1 patch, Transdermal, Q24H, Chesley Noon, MD, 1 patch at 07/05/23 0843   magnesium oxide (MAG-OX)  tablet 400 mg, 400 mg, Oral, Daily, Verdene Lennert, MD, 400 mg at 07/05/23 0841   meloxicam (MOBIC) tablet 15 mg, 15 mg, Oral, Daily, Verdene Lennert, MD, 15 mg at 07/05/23 0841   methocarbamol (ROBAXIN) tablet 500 mg, 500 mg, Oral, Q6H PRN, Verdene Lennert, MD, 500 mg at 07/04/23 0608   metoprolol tartrate (LOPRESSOR) tablet 25 mg, 25 mg, Oral, BID, Verdene Lennert, MD, 25 mg at 07/05/23 0841   ondansetron (ZOFRAN) tablet 4 mg, 4 mg, Oral, Q6H PRN **OR** ondansetron (ZOFRAN) injection 4 mg, 4 mg, Intravenous, Q6H PRN, Verdene Lennert, MD   polyethylene glycol (MIRALAX / GLYCOLAX) packet 17 g, 17 g, Oral, Daily PRN, Verdene Lennert, MD, 17 g at 07/02/23 1202   polyethylene glycol (MIRALAX / GLYCOLAX) packet 17 g, 17 g, Oral, Daily, Wouk, Wilfred Curtis, MD, 17 g at 07/05/23 1610   simethicone (MYLICON) chewable tablet 80 mg, 80 mg, Oral, Q6H PRN, Wouk, Wilfred Curtis, MD   warfarin (COUMADIN) tablet 6 mg, 6 mg, Oral, ONCE-1600, [place], [facility]   Warfarin - Pharmacist Dosing Inpatient, , Does not apply, q1600, Verdene Lennert,  MD, Given at 07/04/23 1700   Social History: Social History   Tobacco Use   Smoking status: Never   Smokeless tobacco: Never  Substance Use Topics   Alcohol use: No    Alcohol/week: 0.0 standard drinks of alcohol   Drug use: No    Family Medical History: Family History  Problem Relation Age of Onset   Hypertension Mother    Lymphoma Mother    Cancer Sister        Breast   Breast cancer Sister 4    Physical Examination: Vitals:   07/05/23 0749 07/05/23 1514  BP: 122/87 101/72  Pulse: 77 86  Resp: 16 16  Temp: (!) 97.4 F (36.3 C) 97.8 F (36.6 C)  SpO2:       General: Patient is well developed, well nourished, calm, collected, and in no apparent distress.  NEUROLOGICAL:  General: In no acute distress.   Awake, alert, oriented to person, place, and time.  Pupils equal round and reactive to light.  Facial tone is symmetric.  Tongue protrusion is midline.  There is no pronator drift.  ROM of spine: ***full.  Palpation of spine: ***nontender.    Strength: Side Biceps Triceps Deltoid Interossei Grip Wrist Ext. Wrist Flex.  R 5 5 5 5 5 5 5   L 5 5 5 5 5 5 5    Side Iliopsoas Quads Hamstring PF DF EHL  R 1 1-2 1-0 3 2 3   L 3 4 4  4+ 4+ 4+     T10 Sensory level, decreased propioception on the right lower extremity.   Reflexes are decreased and symmetric at the biceps, triceps, brachioradialis, patella and achilles. Hoffman's is absent. Clonus is not present.  Toes are mute.  Imaging:   I have personally reviewed the images and agree with the above interpretation.  Labs:    Latest Ref Rng & Units 07/03/2023    4:30 AM 06/30/2023    4:45 AM 06/29/2023   10:23 AM  CBC  WBC 4.0 - 10.5 K/uL 6.8  5.6  5.3   Hemoglobin 12.0 - 15.0 g/dL 96.0  45.4  09.8   Hematocrit 36.0 - 46.0 % 37.7  38.0  39.2   Platelets 150 - 400 K/uL 225  222  235        Assessment and Plan: Ms. Dowler is a pleasant 72 y.o. female with ***  I have discussed the condition with  the patient, including showing the radiographs and discussing treatment options in layman's terms.  The patient may benefit from conservative management.  Thus, I have recommended the following: ***.  I will see the patient back in a few weeks to gauge progress.     Lovenia Kim, MD/MSCR Dept. of Neurosurgery

## 2023-07-05 NOTE — Progress Notes (Signed)
Physical Therapy Treatment Patient Details Name: Suzanne Dixon MRN: [id number] DOB: 1951/09/04 Today's Date: 07/05/2023   History of Present Illness 72 y/o female presented to ED on 06/29/23 for back pain. MRI showed multilevel lumbar spondylosis (worst at L3-4) with severe spinal canal stenosis and moderate bilateral neural foraminal. PMH: paroxysmal atrial fibrillation, hypertension, prior CVA, obesity, hx of breast cancer s/p chemo, mastectomy,and radiation, neuropathy, lymphedema, parkinson's disease    PT Comments  Pt alert, agreeable to PT, reported mild pain in back and RLE. Pt also reported mild dizziness today which is some improvement after her last PT session (previously moderate dizziness). The patient was limited today by fatigue and ongoing feeling of dizziness, though the pt had trouble describing the sensation. Orthostatic vitals assessed, negative for orthostatic hypotension. minA to sit EOB, but modAx2-maxAx2 to return to supine safely. RLE assistance by PT for pain/weakness. Pt in bed with needs in reach at end of session. The patient would benefit from further skilled PT intervention to continue to progress towards goals.   If plan is discharge home, recommend the following: Assistance with cooking/housework;Assist for transportation;Help with stairs or ramp for entrance;Two people to help with walking and/or transfers;Two people to help with bathing/dressing/bathroom   Can travel by private vehicle     No  Equipment Recommendations  Rolling walker (2 wheels);BSC/3in1    Recommendations for Other Services       Precautions / Restrictions Precautions Precautions: Fall Restrictions Weight Bearing Restrictions: No     Mobility  Bed Mobility Overal bed mobility: Needs Assistance Bed Mobility: Supine to Sit, Sit to Supine, Rolling     Supine to sit: Min assist, Mod assist, +2 for physical assistance Sit to supine: Mod assist, +2 for physical assistance         Transfers                   General transfer comment: deferred due to pt fatigue and dizziness    Ambulation/Gait                   Stairs             Wheelchair Mobility     Tilt Bed    Modified Rankin (Stroke Patients Only)       Balance Overall balance assessment: History of Falls, Needs assistance Sitting-balance support: Feet supported, No upper extremity supported Sitting balance-Leahy Scale: Fair Sitting balance - Comments: progressed to fair with time and assistance but posterior lean present                                    Cognition Arousal: Alert Behavior During Therapy: WFL for tasks assessed/performed Overall Cognitive Status: Within Functional Limits for tasks assessed                                          Exercises      General Comments        Pertinent Vitals/Pain Pain Assessment Pain Assessment: Faces Faces Pain Scale: Hurts a little bit Pain Location: R hip, groan Pain Descriptors / Indicators: Burning, Grimacing, Aching, Jabbing Pain Intervention(s): Limited activity within patient's tolerance, Monitored during session, Repositioned    Home Living  Prior Function            PT Goals (current goals can now be found in the care plan section) Progress towards PT goals: Progressing toward goals (limited due to dizziness, pt fatigue)    Frequency    Min 1X/week      PT Plan      Co-evaluation              AM-PAC PT "6 Clicks" Mobility   Outcome Measure  Help needed turning from your back to your side while in a flat bed without using bedrails?: A Little Help needed moving from lying on your back to sitting on the side of a flat bed without using bedrails?: A Lot Help needed moving to and from a bed to a chair (including a wheelchair)?: Total Help needed standing up from a chair using your arms (e.g., wheelchair or bedside  chair)?: Total Help needed to walk in hospital room?: Total Help needed climbing 3-5 steps with a railing? : Total 6 Click Score: 9    End of Session   Activity Tolerance: Treatment limited secondary to medical complications (Comment);Patient limited by fatigue;Other (comment) (dizziness) Patient left: in bed;with call bell/phone within reach;with bed alarm set   PT Visit Diagnosis: Unsteadiness on feet (R26.81);Muscle weakness (generalized) (M62.81);Difficulty in walking, not elsewhere classified (R26.2);Pain Pain - Right/Left:  (midline) Pain - part of body:  (back pain)     Time: 1610-9604 PT Time Calculation (min) (ACUTE ONLY): 23 min  Charges:    $Therapeutic Activity: 23-37 mins PT General Charges $$ ACUTE PT VISIT: 1 Visit                     Olga Coaster PT, DPT 12:05 PM,07/05/23

## 2023-07-05 NOTE — Progress Notes (Signed)
Progress Note   Patient: Suzanne Dixon:[id number] DOB: Jan 11, 1951 DOA: 06/29/2023     0 DOS: the patient was seen and examined on 07/05/2023   Brief hospital course:  Suzanne Dixon is a 72 y.o. female with medical history significant of recurrent UTI, Parkinson's disease, hypertension, OSA on CPAP, atrial fibrillation on Warfarin, lymphedema, degenerative disc disease, breast cancer s/p chemo mastectomy and radiation, hypothyroidism, neuropathy, gout, who presents to the ED due to back pain.  Lumbar MRI showed severe spinal stenosis on L3-L4.  Patient also has significant right leg weakness.    Principal Problem:   Acute back pain Active Problems:   Acute cystitis   AF (paroxysmal atrial fibrillation) (HCC)   Hypertension   Obesity (BMI 30-39.9)   Apnea, sleep   Breast cancer (HCC)   Large granular lymphocytic leukemia (HCC)   Lymphedema   CVA (cerebral vascular accident) (HCC)   Hypothyroidism   Parkinsonism (HCC)   Assessment and Plan: * Acute back pain with severe lumbar stenosis. Patient has been treated with pain medicine, pain is better.  However, patient still has significant right leg weakness.  Will obtain neurosurgery consult.  Acute cystitis Recently patient has been treated with acute UTI.  Blood culture at this time was negative, no need of antibiotics.   AF (paroxysmal atrial fibrillation) (HCC) - Decrease home Metoprolol given bradycardia - Warfarin per pharmacy dosing  Parkinsonism (HCC) - Continue home regimen  Hypothyroidism - Continue home regimen  Large granular lymphocytic leukemia (HCC) Follows with oncology, currently under surveillance only.  No evidence of osseous lesion seen on CT imaging.  Obstruct sleep apnea Morbid obesity with BMI 40.11. - CPAP at bedtime  Hypertension - Continue home regimen       Subjective:  Patient feel pain is better, but still has significant right leg weakness.  No incontinence of urine or  stool.  Physical Exam: Vitals:   07/04/23 1632 07/04/23 2057 07/05/23 0443 07/05/23 0749  BP: 137/81 107/68 (!) 141/87 122/87  Pulse: 81 95 78 77  Resp: 18   16  Temp: (!) 97.5 F (36.4 C) 98 F (36.7 C) 97.8 F (36.6 C) (!) 97.4 F (36.3 C)  TempSrc: Oral Oral Oral   SpO2: 97% 98% 96%   Weight:      Height:       General exam: Appears calm and comfortable  Respiratory system: Clear to auscultation. Respiratory effort normal. Cardiovascular system: S1 & S2 heard, RRR. No JVD, murmurs, rubs, gallops or clicks. No pedal edema. Gastrointestinal system: Abdomen is nondistended, soft and nontender. No organomegaly or masses felt. Normal bowel sounds heard. Central nervous system: Alert and oriented. No focal neurological deficits. Extremities: Symmetric 5 x 5 power. Skin: No rashes, lesions or ulcers Psychiatry: Judgement and insight appear normal. Mood & affect appropriate.    Data Reviewed:  MRI results and lab results reviewed.  Family Communication: None  Disposition: Status is: Observation Pending nursing home placement.     Time spent: 35 minutes  Author: Marrion Coy, MD 07/05/2023 1:26 PM  For on call review [email or web address].

## 2023-07-06 DIAGNOSIS — Z6841 Body Mass Index (BMI) 40.0 and over, adult: Secondary | ICD-10-CM | POA: Diagnosis not present

## 2023-07-06 DIAGNOSIS — N3000 Acute cystitis without hematuria: Secondary | ICD-10-CM | POA: Diagnosis present

## 2023-07-06 DIAGNOSIS — M549 Dorsalgia, unspecified: Secondary | ICD-10-CM | POA: Diagnosis present

## 2023-07-06 DIAGNOSIS — Z1611 Resistance to penicillins: Secondary | ICD-10-CM | POA: Diagnosis present

## 2023-07-06 DIAGNOSIS — G4733 Obstructive sleep apnea (adult) (pediatric): Secondary | ICD-10-CM | POA: Diagnosis present

## 2023-07-06 DIAGNOSIS — E039 Hypothyroidism, unspecified: Secondary | ICD-10-CM | POA: Diagnosis present

## 2023-07-06 DIAGNOSIS — R29898 Other symptoms and signs involving the musculoskeletal system: Secondary | ICD-10-CM

## 2023-07-06 DIAGNOSIS — M5489 Other dorsalgia: Secondary | ICD-10-CM | POA: Diagnosis not present

## 2023-07-06 DIAGNOSIS — I890 Lymphedema, not elsewhere classified: Secondary | ICD-10-CM | POA: Diagnosis present

## 2023-07-06 DIAGNOSIS — Z961 Presence of intraocular lens: Secondary | ICD-10-CM | POA: Diagnosis present

## 2023-07-06 DIAGNOSIS — M4805 Spinal stenosis, thoracolumbar region: Secondary | ICD-10-CM | POA: Diagnosis present

## 2023-07-06 DIAGNOSIS — M109 Gout, unspecified: Secondary | ICD-10-CM | POA: Diagnosis present

## 2023-07-06 DIAGNOSIS — M48061 Spinal stenosis, lumbar region without neurogenic claudication: Secondary | ICD-10-CM | POA: Diagnosis present

## 2023-07-06 DIAGNOSIS — M4714 Other spondylosis with myelopathy, thoracic region: Secondary | ICD-10-CM | POA: Diagnosis present

## 2023-07-06 DIAGNOSIS — C91Z0 Other lymphoid leukemia not having achieved remission: Secondary | ICD-10-CM | POA: Diagnosis present

## 2023-07-06 DIAGNOSIS — M5415 Radiculopathy, thoracolumbar region: Secondary | ICD-10-CM | POA: Diagnosis present

## 2023-07-06 DIAGNOSIS — M545 Low back pain, unspecified: Secondary | ICD-10-CM | POA: Diagnosis not present

## 2023-07-06 DIAGNOSIS — E669 Obesity, unspecified: Secondary | ICD-10-CM | POA: Diagnosis not present

## 2023-07-06 DIAGNOSIS — K219 Gastro-esophageal reflux disease without esophagitis: Secondary | ICD-10-CM | POA: Diagnosis present

## 2023-07-06 DIAGNOSIS — Z8616 Personal history of COVID-19: Secondary | ICD-10-CM | POA: Diagnosis not present

## 2023-07-06 DIAGNOSIS — G20A1 Parkinson's disease without dyskinesia, without mention of fluctuations: Secondary | ICD-10-CM | POA: Diagnosis present

## 2023-07-06 DIAGNOSIS — Z96611 Presence of right artificial shoulder joint: Secondary | ICD-10-CM | POA: Diagnosis present

## 2023-07-06 DIAGNOSIS — M4804 Spinal stenosis, thoracic region: Secondary | ICD-10-CM | POA: Diagnosis present

## 2023-07-06 DIAGNOSIS — I10 Essential (primary) hypertension: Secondary | ICD-10-CM | POA: Diagnosis present

## 2023-07-06 DIAGNOSIS — M5030 Other cervical disc degeneration, unspecified cervical region: Secondary | ICD-10-CM | POA: Diagnosis present

## 2023-07-06 DIAGNOSIS — M5104 Intervertebral disc disorders with myelopathy, thoracic region: Secondary | ICD-10-CM | POA: Diagnosis present

## 2023-07-06 DIAGNOSIS — I480 Paroxysmal atrial fibrillation: Secondary | ICD-10-CM | POA: Diagnosis present

## 2023-07-06 DIAGNOSIS — N3001 Acute cystitis with hematuria: Secondary | ICD-10-CM | POA: Diagnosis not present

## 2023-07-06 DIAGNOSIS — L89312 Pressure ulcer of right buttock, stage 2: Secondary | ICD-10-CM | POA: Diagnosis present

## 2023-07-06 DIAGNOSIS — K5900 Constipation, unspecified: Secondary | ICD-10-CM | POA: Diagnosis present

## 2023-07-06 LAB — PROTIME-INR
INR: 2.1 — ABNORMAL HIGH (ref 0.8–1.2)
Prothrombin Time: 24.2 s — ABNORMAL HIGH (ref 11.4–15.2)

## 2023-07-06 LAB — CBC
HCT: 40.7 % (ref 36.0–46.0)
Hemoglobin: 13.8 g/dL (ref 12.0–15.0)
MCH: 31 pg (ref 26.0–34.0)
MCHC: 33.9 g/dL (ref 30.0–36.0)
MCV: 91.5 fL (ref 80.0–100.0)
Platelets: 239 10*3/uL (ref 150–400)
RBC: 4.45 MIL/uL (ref 3.87–5.11)
RDW: 13.9 % (ref 11.5–15.5)
WBC: 6.2 10*3/uL (ref 4.0–10.5)
nRBC: 0 % (ref 0.0–0.2)

## 2023-07-06 LAB — BASIC METABOLIC PANEL
Anion gap: 8 (ref 5–15)
BUN: 23 mg/dL (ref 8–23)
CO2: 27 mmol/L (ref 22–32)
Calcium: 8.6 mg/dL — ABNORMAL LOW (ref 8.9–10.3)
Chloride: 102 mmol/L (ref 98–111)
Creatinine, Ser: 0.58 mg/dL (ref 0.44–1.00)
GFR, Estimated: >60 mL/min (ref >60)
Glucose, Bld: 81 mg/dL (ref 70–99)
Potassium: 4.3 mmol/L (ref 3.5–5.1)
Sodium: 137 mmol/L (ref 135–145)

## 2023-07-06 MED ORDER — PANTOPRAZOLE SODIUM 40 MG PO TBEC
40.0000 mg | DELAYED_RELEASE_TABLET | Freq: Every day | ORAL | Status: DC
  Administered 2023-07-06 – 2023-07-13 (×7): 40 mg via ORAL
  Filled 2023-07-06 (×7): qty 1

## 2023-07-06 MED ORDER — SODIUM CHLORIDE 0.9% IV SOLUTION: Freq: Once | INTRAVENOUS | Status: AC

## 2023-07-06 MED ORDER — WARFARIN SODIUM 6 MG PO TABS
6.0000 mg | ORAL_TABLET | Freq: Once | ORAL | Status: DC
  Filled 2023-07-06: qty 1

## 2023-07-06 NOTE — Progress Notes (Signed)
Progress Note   Patient: Suzanne Dixon EAV:[id number] DOB: 08-16-1951 DOA: 06/29/2023     0 DOS: the patient was seen and examined on 07/06/2023   Brief hospital course:  SHOSHANNAH KIELAR is a 72 y.o. female with medical history significant of recurrent UTI, Parkinson's disease, hypertension, OSA on CPAP, atrial fibrillation on Warfarin, lymphedema, degenerative disc disease, breast cancer s/p chemo mastectomy and radiation, hypothyroidism, neuropathy, gout, who presents to the ED due to back pain.  Lumbar MRI showed severe spinal stenosis on L3-L4.  Patient also has significant right leg weakness. Consulted neurosurgery, MRI of the thoracic spine was also performed, showed severe stenosis in T11-T12 which was responsible for leg weakness.  Surgery scheduled 10/18.    Principal Problem:   Acute back pain Active Problems:   Acute cystitis   AF (paroxysmal atrial fibrillation) (HCC)   Hypertension   Obesity (BMI 30-39.9)   Apnea, sleep   Breast cancer (HCC)   Large granular lymphocytic leukemia (HCC)   Lymphedema   CVA (cerebral vascular accident) (HCC)   Hypothyroidism   Parkinsonism (HCC)   Thoracic spondylosis with myelopathy   Weakness of both lower extremities   Spinal stenosis, thoracic   Assessment and Plan:  * Acute back pain with severe lumbar stenosis. Severe thoracic spine stenosis. Patient has been treated with pain medicine, pain is better.  However, patient still has significant right leg weakness.  Consult from neurosurgery obtained, MRI of the thoracic spine was also obtained, showed severe stenosis in T 11 to T12, which he appeared to be the source of right leg weakness.  Neurosurgery has scheduled for spinal surgery 10/18.   Acute cystitis Recently patient has been treated with acute UTI.  Blood culture at this time was negative, no need of antibiotics.     AF (paroxysmal atrial fibrillation) (HCC) - Decrease home Metoprolol given bradycardia Warfarin  discontinued due to scheduled surgery.  Will give 2 FFPs this evening.   Parkinsonism (HCC) - Continue home regimen   Hypothyroidism - Continue home regimen   Large granular lymphocytic leukemia (HCC) Follows with oncology, currently under surveillance only.  No evidence of osseous lesion seen on CT imaging.   Obstruct sleep apnea Morbid obesity with BMI 40.11. - CPAP at bedtime   Hypertension - Continue home regimen       Subjective:  Patient doing well other than weakness in the right leg.  Physical Exam: Vitals:   07/05/23 2034 07/05/23 2139 07/06/23 0447 07/06/23 0808  BP: 108/68 126/86 (!) 149/90 136/76  Pulse: 81 81 70 75  Resp: 18  18 12   Temp: 97.6 F (36.4 C)  (!) 97.5 F (36.4 C) 97.6 F (36.4 C)  TempSrc: Oral     SpO2:   97% 100%  Weight:      Height:       General exam: Appears calm and comfortable  Respiratory system: Clear to auscultation. Respiratory effort normal. Cardiovascular system: S1 & S2 heard, RRR. No JVD, murmurs, rubs, gallops or clicks. No pedal edema. Gastrointestinal system: Abdomen is nondistended, soft and nontender. No organomegaly or masses felt. Normal bowel sounds heard. Central nervous system: Alert and oriented. No focal neurological deficits. Extremities: Symmetric 5 x 5 power. Skin: No rashes, lesions or ulcers Psychiatry: Judgement and insight appear normal. Mood & affect appropriate.    Data Reviewed:  MRI and lab results reviewed.  Family Communication: Daughter updated over the phone.  Disposition: Status is: Observation      Time spent:  35 minutes  Author: Marrion Coy, MD 07/06/2023 12:05 PM  For on call review [email or web address].

## 2023-07-06 NOTE — Consult Note (Addendum)
Pharmacy Consult Note - Anticoagulation  Pharmacy Consult for warfarin Indication: atrial fibrillation  PATIENT MEASUREMENTS: Height: 5\' 4"  (162.6 cm) Weight: 106 kg (233 lb 11 oz) IBW/kg (Calculated) : 54.7 HEPARIN DW (KG): 79.7  VITAL SIGNS: Temp: 97.5 F (36.4 C) (10/17 0447) Temp Source: Oral (10/16 2034) BP: 149/90 (10/17 0447) Pulse Rate: 70 (10/17 0447)  Recent Labs    07/06/23 0546  HGB 13.8  HCT 40.7  PLT 239  LABPROT 24.2*  INR 2.1*  CREATININE 0.58    Estimated Creatinine Clearance: 73.2 mL/min (by C-G formula based on SCr of 0.58 mg/dL).  PAST MEDICAL HISTORY: Past Medical History:  Diagnosis Date   A-fib (HCC)    Anemia    B12 deficiency    Breast cancer (HCC) 03/31/2013   Right - chemo- mastectomy   Breast cancer (HCC) 1991   Rt.- radiation   Complication of anesthesia 01/2009   Recent years with general anesthesia had itching following surgery   DDD (degenerative disc disease), cervical    Depression    Diverticulitis    Dyspnea    Edema of both legs    GERD (gastroesophageal reflux disease)    Gout    H/O mastectomy 2014   per patient   H/O: cesarean section 1987   per patient report   H/O: hysterectomy 1996   per patient   Hydradenitis    Hypertension    Hypothyroidism    Neuropathy    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HCC)    Parkinson's disease (HCC)    Personal history of chemotherapy    Personal history of radiation therapy    Ruptured cervical disc 2002   per patient    Sleep apnea    cpap machine   Super obese    Tachycardia     ASSESSMENT: 72 y.o. female with PMH Afib (on warfarin), HTN, breast cancer s/p mastectomy and chemoradiation, recurrent UTIs is presenting with acute lower back pain. Patient is on chronic anticoagulation per chart review. Pharmacy has been consulted to initiate and manage warfarin.  Pertinent medications: Home warfarin regimen  Warfarin 6 mg PO once daily on Tue, Wed, Thu, Sat, Sun  Warfarin 3 mg PO  once daily on Mon and Fri Last warfarin dose PTA was evening of 10/9 Patient has 6 mg and 1 mg tablets at home  Goal(s) of therapy: INR 2 - 3 Monitor platelets by anticoagulation protocol: Yes   Baseline anticoagulation labs: Recent Labs    07/04/23 0601 07/05/23 0340 07/06/23 0546  INR 2.2* 2.1* 2.1*  HGB  --   --  13.8  PLT  --   --  239   Date INR Warfarin Dose  10/10 3.2 HELD  10/11 3.1 1 mg (ordered)  10/12 2.8 3 mg  10/13 2.2 6 mg  10/14 2.1 3 mg  10/15 2.2 6 mg  10/16 2.1 6 mg  10/17 2.1 6 mg   PLAN: INR remains therapeutic today; Hgb stable at 13.8, plt WNL at 239; no signs/symptoms of bleeding reported.  Will give warfarin 6 mg x 1 tonight (home dose) Monitor daily INR while admitted Check CBC at least every 3 days  Rockwell Alexandria, PharmD Clinical Pharmacist 07/06/2023 7:26 AM  __________________________________________________________________________ 1017 1214 Update  Neurosurgery plans for procedure on 07/07/23. Will hold warfarin tonight in anticipation of procedure.  Plan: Hold warfarin dose this evening F/u restart anticoagulation post-procedure  Thank you for involving pharmacy in this patient's care.   Rockwell Alexandria, PharmD Clinical Pharmacist  07/06/2023 12:16 PM

## 2023-07-06 NOTE — Discharge Instructions (Addendum)
Your surgeon has performed an operation on your spine to relieve pressure on one or more nerves. Many times, patients feel better immediately after surgery and can "overdo it." Even if you feel well, it is important that you follow these activity guidelines. If you do not let your back heal properly from the surgery, you can increase the chance of hardware complications and/or return of your symptoms. The following are instructions to help in your recovery once you have been discharged from the hospital.  Do not use NSAIDs for 3 months after surgery.   Activity    No bending, lifting, or twisting ("BLT"). Avoid lifting objects heavier than 10 pounds (gallon milk jug).  Where possible, avoid household activities that involve lifting, bending, pushing, or pulling such as laundry, vacuuming, grocery shopping, and childcare. Try to arrange for help from friends and family for these activities while your back heals.  Increase physical activity slowly as tolerated.  Taking short walks is encouraged, but avoid strenuous exercise. Do not jog, run, bicycle, lift weights, or participate in any other exercises unless specifically allowed by your doctor. Avoid prolonged sitting, including car rides.  Talk to your doctor before resuming sexual activity.  You should not drive until cleared by your doctor.  Until released by your doctor, you should not return to work or school.  You should rest at home and let your body heal.   You may shower three days after your surgery.  After showering, lightly dab your incision dry. Do not take a tub bath or go swimming for 3 weeks, or until approved by your doctor at your follow-up appointment.  If you smoke, we strongly recommend that you quit.  Smoking has been proven to interfere with normal healing in your back and will dramatically reduce the success rate of your surgery. Please contact [facility] (800-QUIT-NOW) and use the resources at www.[facility].com for  assistance in stopping smoking.  Surgical Incision   If you have a dressing on your incision, you may remove it three days after your surgery. Keep your incision area clean and dry.  Your incision was closed with Dermabond glue. The glue should begin to peel away within about a week.  Diet            You may return to your usual diet. Be sure to stay hydrated.  When to Contact us  Although your surgery and recovery will likely be uneventful, you may have some residual numbness, aches, and pains in your back and/or legs. This is normal and should improve in the next few weeks.  However, should you experience any of the following, contact us immediately: New numbness or weakness Pain that is progressively getting worse, and is not relieved by your pain medications or rest Bleeding, redness, swelling, pain, or drainage from surgical incision Chills or flu-like symptoms Fever greater than 101.0 F (38.3 C) Problems with bowel or bladder functions Difficulty breathing or shortness of breath Warmth, tenderness, or swelling in your calf  Contact Information How to contact us:  If you have any questions/concerns before or after surgery, you can reach [place] at [phone], or you can send a mychart message. We can be reached by phone or mychart 8am-4pm, Monday-Friday.  *Please note: Calls after 4pm are forwarded to a third party answering service. Mychart messages are not routinely monitored during evenings, weekends, and holidays. Please call our office to contact the answering service for urgent concerns during non-business hours.     Food Resources  Agency Name: [facility] Address: [address], [place], [place] [address] Phone: [phone] Website: [email or web address] Service(s) Offered: Housing services, self-sufficiency, congregate meal program, weatherization program, Event organiser program, emergency food assistance,  housing  counseling, home ownership program, wheels - to work program.  [employer] free for 60 and older at various locations from [employer], Monday-Friday:  [employer], [address] [place], [phone] -[facility], [address] [place] [phone]  -[facility], [address] [place] [phone]  -[address], [place], [phone]  Agency Name: [facility] Address: [phone] [address], [place], [place] [address] Phone: [phone] Website: [email or web address] Service(s) Offered: Home delivered hot, frozen, and emergency  meals. Grocery assistance program which matches  volunteers one-on-one with seniors unable to grocery shop  for themselves. Must be 60 years and older; less than 20  hours of in-home aide service, limited or no driving ability;  live alone or with someone with a disability; live in  [place].  Agency Name: [employer] [facility] [employer]) Address: [address], [place], [place] [address] Phone: [phone] Service(s) Offered: Food is served to shut-ins, homeless, elderly, and low income people in the community every Saturday (11:30 am-12:30 pm) and Sunday (12:30 pm-1:30pm). Volunteers also offer help and encouragement in seeking employment,  and spiritual guidance.  Agency Name: [employer] Address: [address] [place], [place] [address] Phone: [phone] Service(s) Offered: Child support services; child welfare services; food stamps; Medicaid; work first family assistance; and aid with fuel,  rent, food and medicine.  Agency Name: [employer] Address: [address] [place], [place] Phone: [phone] Website: [email or web address] Services Offered: Monday 10:00am-12:00, 8:00pm-9:00pm, and Friday 10:00am-12:00.  Agency Name: [employer] Address: [address], [place], [place]  [address] Phone: [phone] Website: [email or web address] Service(s) Offered: Serves weekday meals, open from 11:30 am- 1:00 pm., and 6:30-7:30pm, Monday-Wednesday-Friday distributes food 3:30-6pm, Monday-Wednesday-Friday.  Agency Name: [facility] Address: [address], [place], [place] Phone: [phone] Website: [email or web address] Services Offered: Distributes food the 4th Saturday of the month, starting at 8:00 am  Agency Name: [facility] Address: [phone] [address] [place], [place] [address] Phone: [phone] Website: [email or web address] Service(s) Offered: Bread of life, weekly food pantry. Open Wednesdays from 10:00am-noon.  Agency Name: [facility] [employer] Address: [address] [place], [place], [place] Phone: [phone] Services Offered: Distributes food 9am-1pm, Monday-Thursday. Call for details.  Agency Name: [facility] Address: [address] [place], [place] [address] Phone: [phone] Website: [email or web address] Service(s) Offered: Games developer. Call for assistance.  Agency Name: [facility] Address: [address], [place], [place] [address] Phone: [phone] Service Offered: Emergency Food Pantry. Call for appointment.  Agency Name: [facility] Address: [address], [place], [place] [address] Phone: [phone] Website: [email or web address] Services Offered: Games developer. Call for details  Agency Name: [facility] Address: [address] [place], [place] Phone: [phone] Website: [email or web address] Service(s) Offered: Emergency Food Pantry. Call for details.  Agency Name: [employer] representative Address: [address] [place], [place] [address] Phone: [phone] or [phone] Website: [email or web address] Service(s) Offered: Distribute food 9am-11:30 am, Tuesday-Friday, and 1-3:30pm,  Monday-Friday. Food pantry Monday-Friday 1pm-3pm, fresh items, Mon.-Wed.-Fri.  Agency Name: [facility] Address: [address] [place], [place] [address] Phone: [phone] Website: [email or web address] Services Offered: Distribute food Tues and Sats from 9:00am-noon. Closed 1st  Saturday of each month. Call for details  Agency Name: [employer] Address: [place] [place] [facility] [address] [place] [address] Phone: [phone]  Services Offered: Delivers meals every Thursday

## 2023-07-06 NOTE — TOC Progression Note (Signed)
Transition of Care [facility]) - Progression Note    Patient Details  Name: Suzanne Dixon MRN: [id number] Date of Birth: 07-02-1951  Transition of [facility]) CM/SW Contact  Garret Reddish, RN Phone Number: 07/06/2023, 1:03 PM  Clinical Narrative:     Chart reviewed. Neurosurgery consulted to see patient.  Patient is scheduled to have T 11-12 laminectomy, transpedicular decompression on the right, and T 11-T 12 fusion with possible extension to L1 on Friday.    Patient is hopeful that she will be able to discharge home with [facility] post surgery.  She is still considering SNF if needed after her surgery.  I have informed [name] today continue to have only one bed offer.  She reports that she will not consider [facility].  [facility]  SNF referral pending at this time.   TOC will continue to follow for post surgical needs.           Expected Discharge Plan and Services                                               Social Determinants of Health (SDOH) Interventions SDOH Screenings   Food Insecurity: Food Insecurity Present (06/29/2023)  Housing: Low Risk  (06/29/2023)  Transportation Needs: No Transportation Needs (06/29/2023)  Recent Concern: Transportation Needs - Unmet Transportation Needs (05/02/2023)   Received from [facility]  Utilities: Not At Risk (06/29/2023)  Financial Resource Strain: Low Risk  (06/09/2023)   Received from [facility]  Recent Concern: Financial Resource Strain - High Risk (05/02/2023)   Received from [facility]  Physical Activity: Insufficiently Active (05/02/2023)   Received from [facility]  Social Connections: Moderately Isolated (05/02/2023)   [facility] from [facility]  Stress: Stress Concern Present (05/02/2023)   Received from [facility]  Tobacco Use: Low Risk  (06/29/2023)  Health Literacy: Inadequate Health  Literacy (05/02/2023)   Received from [facility]    Readmission Risk Interventions    06/21/2021    4:12 PM  Readmission Risk Prevention Plan  Transportation Screening Complete  PCP or Specialist Appt within 3-5 Days Complete  HRI or Home Care Consult Complete  Social Work Consult for Recovery Care Planning/Counseling Complete  Palliative Care Screening Not Applicable  Medication Review Oceanographer) Complete

## 2023-07-06 NOTE — Progress Notes (Signed)
Occupational Therapy Treatment Patient Details Name: Suzanne Dixon MRN: [id number] DOB: March 31, 1951 Today's Date: 07/06/2023   History of present illness 72 y/o female presented to ED on 06/29/23 for back pain. MRI showed multilevel lumbar spondylosis (worst at L3-4) with severe spinal canal stenosis and moderate bilateral neural foraminal. PMH: paroxysmal atrial fibrillation, hypertension, prior CVA, obesity, hx of breast cancer s/p chemo, mastectomy,and radiation, neuropathy, lymphedema, parkinson's disease   OT comments  Pt requires Max A to come into sitting EOB, endorses increased pain with movement. Pt is able to maintain sitting posture, w/ posterior lean and 1-handed UE support, for 20+ minutes. Is able to sit up straighter on EOB with cueing but struggles to maintain this posture for longer than ~ 1 minute. Pt states she would like to get out of bed and spend time in recliner if possible; however, w/ Max A +2 she is unable to perform sit<recliner transfer. She requires Max A for bed-level pericare. Pt demonstrated improved activity tolerance, with extended time sitting, but still remains very limited in functional mobility, and far from her baseline.        If plan is discharge home, recommend the following:  A lot of help with bathing/dressing/bathroom;Assistance with cooking/housework;A lot of help with walking and/or transfers;Help with stairs or ramp for entrance   Equipment Recommendations       Recommendations for Other Services      Precautions / Restrictions Precautions Precautions: Fall Restrictions Weight Bearing Restrictions: No       Mobility Bed Mobility Overal bed mobility: Needs Assistance Bed Mobility: Supine to Sit, Sit to Supine     Supine to sit: Mod assist for physical assistance Sit to supine: Mod assist, +2 for physical assistance        Transfers Overall transfer level: Needs assistance Equipment used: Rolling walker (2 wheels) Transfers: Bed  to chair/wheelchair/BSC     Squat pivot transfers: +2 physical assistance, +2 safety/equipment, Max assist, From elevated surface       General transfer comment: Pt unable to complete transfer, can clear buttocks from mattress ~ 2" with +2 assist, pt unable to doing any WBing     Balance Overall balance assessment: History of Falls, Needs assistance Sitting-balance support: Single extremity supported, Feet supported Sitting balance-Leahy Scale: Fair Sitting balance - Comments: posterior lean and slouched posture. Pt able to sit taller with cueing; however, she reports this causes her back pain Postural control: Posterior lean Standing balance support: Bilateral upper extremity supported, Reliant on assistive device for balance, During functional activity Standing balance-Leahy Scale: Zero                             ADL either performed or assessed with clinical judgement   ADL                       Lower Body Dressing: Maximal assistance;Sitting/lateral leans Lower Body Dressing Details (indicate cue type and reason): donning socks     Toileting- Clothing Manipulation and Hygiene: Bed level;Total assistance Toileting - Clothing Manipulation Details (indicate cue type and reason): Total A for pericare following incontinent BM in bed, pt unaware     Functional mobility during ADLs: +2 for physical assistance;Maximal assistance;Rolling walker (2 wheels) General ADL Comments: x2 attempted transfers sitting EOB to recliner, with Max A+ 2 for physcial A, pt unable to complete transfer on this date    Extremity/Trunk Assessment Upper Extremity Assessment  Upper Extremity Assessment: Generalized weakness   Lower Extremity Assessment Lower Extremity Assessment: Generalized weakness;RLE deficits/detail RLE Sensation: decreased light touch;history of peripheral neuropathy RLE Coordination: decreased gross motor;decreased fine motor        Vision        Perception     Praxis      Cognition Arousal: Alert Behavior During Therapy: WFL for tasks assessed/performed Overall Cognitive Status: Within Functional Limits for tasks assessed                                          Exercises      Shoulder Instructions       General Comments      Pertinent Vitals/ Pain       Pain Assessment Pain Assessment: 0-10 Pain Score: 3  Pain Location: R leg Pain Descriptors / Indicators: Burning, Numbness, Pins and needles, Tingling, Guarding Pain Intervention(s): Limited activity within patient's tolerance, Monitored during session, Repositioned  Home Living                                          Prior Functioning/Environment              Frequency  Min 1X/week        Progress Toward Goals  OT Goals(current goals can now be found in the care plan section)        Plan      Co-evaluation                 AM-PAC OT "6 Clicks" Daily Activity     Outcome Measure   Help from another person eating meals?: None Help from another person taking care of personal grooming?: A Little Help from another person toileting, which includes using toliet, bedpan, or urinal?: A Lot Help from another person bathing (including washing, rinsing, drying)?: A Lot Help from another person to put on and taking off regular upper body clothing?: A Lot Help from another person to put on and taking off regular lower body clothing?: A Lot 6 Click Score: 15    End of Session Equipment Utilized During Treatment: Rolling walker (2 wheels)  OT Visit Diagnosis: Unsteadiness on feet (R26.81);Other abnormalities of gait and mobility (R26.89);Repeated falls (R29.6);Muscle weakness (generalized) (M62.81)   Activity Tolerance Other (comment) (pt limited by debility, imbalance, R LE numbness and immobility)   Patient Left in bed;with call bell/phone within reach;with bed alarm set   Nurse Communication           Time: 1422-1510 OT Time Calculation (min): 48 min  Charges: OT General Charges $OT Visit: 1 Visit OT Treatments $Self Care/Home Management : 8-22 mins $Therapeutic Activity: 23-37 mins Latina Craver, PhD, MS, OTR/L 07/06/23, 4:20 PM

## 2023-07-07 ENCOUNTER — Inpatient Hospital Stay: Payer: Medicare HMO | Admitting: Anesthesiology

## 2023-07-07 ENCOUNTER — Other Ambulatory Visit: Payer: Self-pay

## 2023-07-07 ENCOUNTER — Encounter
Admission: EM | Disposition: A | Discharge status: Skilled Nursing Facility | Payer: Self-pay | Source: Home / Self Care | Attending: Internal Medicine

## 2023-07-07 ENCOUNTER — Inpatient Hospital Stay: Payer: Medicare HMO

## 2023-07-07 DIAGNOSIS — I10 Essential (primary) hypertension: Secondary | ICD-10-CM | POA: Diagnosis not present

## 2023-07-07 DIAGNOSIS — M4714 Other spondylosis with myelopathy, thoracic region: Secondary | ICD-10-CM | POA: Diagnosis not present

## 2023-07-07 DIAGNOSIS — R29898 Other symptoms and signs involving the musculoskeletal system: Secondary | ICD-10-CM

## 2023-07-07 DIAGNOSIS — I480 Paroxysmal atrial fibrillation: Secondary | ICD-10-CM | POA: Diagnosis not present

## 2023-07-07 DIAGNOSIS — M4804 Spinal stenosis, thoracic region: Secondary | ICD-10-CM | POA: Diagnosis not present

## 2023-07-07 DIAGNOSIS — M4805 Spinal stenosis, thoracolumbar region: Secondary | ICD-10-CM

## 2023-07-07 HISTORY — PX: APPLICATION OF INTRAOPERATIVE CT SCAN: SHX6668

## 2023-07-07 LAB — PREPARE FRESH FROZEN PLASMA
Unit division: 0
Unit division: 0

## 2023-07-07 LAB — BASIC METABOLIC PANEL
Anion gap: 8 (ref 5–15)
BUN: 24 mg/dL — ABNORMAL HIGH (ref 8–23)
CO2: 30 mmol/L (ref 22–32)
Calcium: 8.9 mg/dL (ref 8.9–10.3)
Chloride: 101 mmol/L (ref 98–111)
Creatinine, Ser: 0.63 mg/dL (ref 0.44–1.00)
GFR, Estimated: >60 mL/min (ref >60)
Glucose, Bld: 87 mg/dL (ref 70–99)
Potassium: 4.4 mmol/L (ref 3.5–5.1)
Sodium: 139 mmol/L (ref 135–145)

## 2023-07-07 LAB — BPAM FFP
Blood Product Expiration Date: 202410222359
Blood Product Expiration Date: 202410222359
ISSUE DATE / TIME: 202410171410
ISSUE DATE / TIME: 202410171846
Unit Type and Rh: 7300
Unit Type and Rh: 7300

## 2023-07-07 LAB — PROTIME-INR
INR: 1.6 — ABNORMAL HIGH (ref 0.8–1.2)
Prothrombin Time: 19.3 s — ABNORMAL HIGH (ref 11.4–15.2)

## 2023-07-07 LAB — GLUCOSE, CAPILLARY: Glucose-Capillary: 145 mg/dL — ABNORMAL HIGH (ref 70–99)

## 2023-07-07 SURGERY — THORACIC FUSION 1 LEVEL
Anesthesia: General | Site: Spine Thoracic

## 2023-07-07 MED ORDER — VASOPRESSIN 20 UNIT/ML IV SOLN
INTRAVENOUS | Status: DC | PRN
Start: 2023-07-07 — End: 2023-07-07
  Administered 2023-07-07 (×3): 2 [IU] via INTRAVENOUS

## 2023-07-07 MED ORDER — KETAMINE HCL 50 MG/5ML IJ SOSY
PREFILLED_SYRINGE | INTRAMUSCULAR | Status: DC | PRN
Start: 2023-07-07 — End: 2023-07-07
  Administered 2023-07-07: 20 mg via INTRAVENOUS
  Administered 2023-07-07: 30 mg via INTRAVENOUS

## 2023-07-07 MED ORDER — PROPOFOL 1000 MG/100ML IV EMUL
INTRAVENOUS | Status: AC
  Filled 2023-07-07: qty 100

## 2023-07-07 MED ORDER — SODIUM CHLORIDE FLUSH 0.9 % IV SOLN
INTRAVENOUS | Status: AC
  Filled 2023-07-07: qty 20

## 2023-07-07 MED ORDER — 0.9 % SODIUM CHLORIDE (POUR BTL) OPTIME
TOPICAL | Status: DC | PRN
  Administered 2023-07-07: 500 mL

## 2023-07-07 MED ORDER — PROPOFOL 10 MG/ML IV BOLUS
INTRAVENOUS | Status: DC | PRN
  Administered 2023-07-07: 120 mg via INTRAVENOUS
  Administered 2023-07-07: 80 mg via INTRAVENOUS

## 2023-07-07 MED ORDER — VASOPRESSIN 20 UNIT/ML IV SOLN
INTRAVENOUS | Status: AC
  Filled 2023-07-07: qty 1

## 2023-07-07 MED ORDER — SUCCINYLCHOLINE CHLORIDE 200 MG/10ML IV SOSY
PREFILLED_SYRINGE | INTRAVENOUS | Status: AC
  Filled 2023-07-07: qty 10

## 2023-07-07 MED ORDER — ACETAMINOPHEN 10 MG/ML IV SOLN
INTRAVENOUS | Status: AC
  Filled 2023-07-07: qty 100

## 2023-07-07 MED ORDER — IRRISEPT - 450ML BOTTLE WITH 0.05% CHG IN STERILE WATER, USP 99.95% OPTIME
TOPICAL | Status: DC | PRN
Start: 2023-07-07 — End: 2023-07-07
  Administered 2023-07-07: 450 mL

## 2023-07-07 MED ORDER — PROPOFOL 500 MG/50ML IV EMUL
INTRAVENOUS | Status: DC | PRN
Start: 2023-07-07 — End: 2023-07-07
  Administered 2023-07-07: 140 ug/kg/min via INTRAVENOUS

## 2023-07-07 MED ORDER — SODIUM CHLORIDE (PF) 0.9 % IJ SOLN
INTRAMUSCULAR | Status: DC | PRN
  Administered 2023-07-07: 60 mL via INTRAMUSCULAR

## 2023-07-07 MED ORDER — GLYCOPYRROLATE 0.2 MG/ML IJ SOLN
INTRAMUSCULAR | Status: DC | PRN
  Administered 2023-07-07: .2 mg via INTRAVENOUS

## 2023-07-07 MED ORDER — PHENYLEPHRINE HCL-NACL 20-0.9 MG/250ML-% IV SOLN
INTRAVENOUS | Status: AC
  Filled 2023-07-07: qty 250

## 2023-07-07 MED ORDER — FENTANYL CITRATE (PF) 100 MCG/2ML IJ SOLN
INTRAMUSCULAR | Status: AC
  Filled 2023-07-07: qty 2

## 2023-07-07 MED ORDER — OXYCODONE HCL 5 MG/5ML PO SOLN: 5.0000 mg | Freq: Once | ORAL | Status: DC | PRN

## 2023-07-07 MED ORDER — ONDANSETRON HCL 4 MG/2ML IJ SOLN
INTRAMUSCULAR | Status: DC | PRN
  Administered 2023-07-07: 4 mg via INTRAVENOUS

## 2023-07-07 MED ORDER — PHENYLEPHRINE 80 MCG/ML (10ML) SYRINGE FOR IV PUSH (FOR BLOOD PRESSURE SUPPORT)
PREFILLED_SYRINGE | INTRAVENOUS | Status: DC | PRN
  Administered 2023-07-07: 160 ug via INTRAVENOUS
  Administered 2023-07-07: 80 ug via INTRAVENOUS
  Administered 2023-07-07: 160 ug via INTRAVENOUS

## 2023-07-07 MED ORDER — BUPIVACAINE HCL (PF) 0.5 % IJ SOLN
INTRAMUSCULAR | Status: AC
  Filled 2023-07-07: qty 30

## 2023-07-07 MED ORDER — CHLORHEXIDINE GLUCONATE 0.12 % MT SOLN
OROMUCOSAL | Status: AC
  Filled 2023-07-07: qty 15

## 2023-07-07 MED ORDER — ACETAMINOPHEN 10 MG/ML IV SOLN: 1000.0000 mg | Freq: Once | INTRAVENOUS | Status: DC | PRN

## 2023-07-07 MED ORDER — FENTANYL CITRATE (PF) 100 MCG/2ML IJ SOLN
INTRAMUSCULAR | Status: DC | PRN
  Administered 2023-07-07 (×2): 50 ug via INTRAVENOUS

## 2023-07-07 MED ORDER — DROPERIDOL 2.5 MG/ML IJ SOLN: 0.6250 mg | Freq: Once | INTRAMUSCULAR | Status: DC | PRN

## 2023-07-07 MED ORDER — SURGIFLO WITH THROMBIN (HEMOSTATIC MATRIX KIT) OPTIME
TOPICAL | Status: DC | PRN
  Administered 2023-07-07: 1 via TOPICAL

## 2023-07-07 MED ORDER — CEFAZOLIN SODIUM-DEXTROSE 2-4 GM/100ML-% IV SOLN
2.0000 g | Freq: Once | INTRAVENOUS | Status: AC
  Administered 2023-07-07: 2 g via INTRAVENOUS

## 2023-07-07 MED ORDER — LIDOCAINE HCL (CARDIAC) PF 100 MG/5ML IV SOSY
PREFILLED_SYRINGE | INTRAVENOUS | Status: DC | PRN
  Administered 2023-07-07: 60 mg via INTRAVENOUS

## 2023-07-07 MED ORDER — LIDOCAINE HCL (PF) 2 % IJ SOLN
INTRAMUSCULAR | Status: AC
  Filled 2023-07-07: qty 5

## 2023-07-07 MED ORDER — PHENYLEPHRINE HCL-NACL 20-0.9 MG/250ML-% IV SOLN
INTRAVENOUS | Status: DC | PRN
Start: 2023-07-07 — End: 2023-07-07
  Administered 2023-07-07: 40 ug/min via INTRAVENOUS

## 2023-07-07 MED ORDER — OXYCODONE HCL 5 MG PO TABS: 5.0000 mg | ORAL_TABLET | Freq: Once | ORAL | Status: DC | PRN

## 2023-07-07 MED ORDER — PHENYLEPHRINE 80 MCG/ML (10ML) SYRINGE FOR IV PUSH (FOR BLOOD PRESSURE SUPPORT)
PREFILLED_SYRINGE | INTRAVENOUS | Status: AC
  Filled 2023-07-07: qty 10

## 2023-07-07 MED ORDER — DEXMEDETOMIDINE HCL IN NACL 80 MCG/20ML IV SOLN
INTRAVENOUS | Status: DC | PRN
Start: 2023-07-07 — End: 2023-07-07
  Administered 2023-07-07: 8 ug via INTRAVENOUS
  Administered 2023-07-07: 12 ug via INTRAVENOUS

## 2023-07-07 MED ORDER — HYDROMORPHONE HCL 1 MG/ML IJ SOLN
0.5000 mg | INTRAMUSCULAR | Status: DC | PRN
  Administered 2023-07-07 – 2023-07-08 (×4): 0.5 mg via INTRAVENOUS
  Filled 2023-07-07 (×4): qty 0.5

## 2023-07-07 MED ORDER — LACTATED RINGERS IV SOLN
INTRAVENOUS | Status: DC | PRN
Start: 2023-07-07 — End: 2023-07-07

## 2023-07-07 MED ORDER — BUPIVACAINE-EPINEPHRINE (PF) 0.5% -1:200000 IJ SOLN
INTRAMUSCULAR | Status: AC
  Filled 2023-07-07: qty 30

## 2023-07-07 MED ORDER — PROPOFOL 10 MG/ML IV BOLUS
INTRAVENOUS | Status: AC
  Filled 2023-07-07: qty 20

## 2023-07-07 MED ORDER — VANCOMYCIN HCL IN DEXTROSE 1-5 GM/200ML-% IV SOLN
1000.0000 mg | Freq: Once | INTRAVENOUS | Status: AC
  Administered 2023-07-07: 1000 mg via INTRAVENOUS

## 2023-07-07 MED ORDER — CHLORHEXIDINE GLUCONATE 0.12 % MT SOLN
15.0000 mL | Freq: Once | OROMUCOSAL | Status: AC
  Administered 2023-07-07: 15 mL via OROMUCOSAL

## 2023-07-07 MED ORDER — ACETAMINOPHEN 10 MG/ML IV SOLN
INTRAVENOUS | Status: DC | PRN
  Administered 2023-07-07: 1000 mg via INTRAVENOUS

## 2023-07-07 MED ORDER — LACTATED RINGERS IV SOLN: INTRAVENOUS | Status: DC

## 2023-07-07 MED ORDER — BUPIVACAINE LIPOSOME 1.3 % IJ SUSP
INTRAMUSCULAR | Status: AC
  Filled 2023-07-07: qty 20

## 2023-07-07 MED ORDER — FENTANYL CITRATE (PF) 100 MCG/2ML IJ SOLN
25.0000 ug | INTRAMUSCULAR | Status: DC | PRN
  Administered 2023-07-07: 25 ug via INTRAVENOUS

## 2023-07-07 MED ORDER — SUCCINYLCHOLINE CHLORIDE 200 MG/10ML IV SOSY
PREFILLED_SYRINGE | INTRAVENOUS | Status: DC | PRN
  Administered 2023-07-07: 100 mg via INTRAVENOUS

## 2023-07-07 MED ORDER — DEXAMETHASONE SODIUM PHOSPHATE 10 MG/ML IJ SOLN
INTRAMUSCULAR | Status: DC | PRN
  Administered 2023-07-07: 10 mg via INTRAVENOUS

## 2023-07-07 MED ORDER — KETAMINE HCL 50 MG/5ML IJ SOSY
PREFILLED_SYRINGE | INTRAMUSCULAR | Status: AC
  Filled 2023-07-07: qty 5

## 2023-07-07 MED ORDER — BUPIVACAINE-EPINEPHRINE (PF) 0.5% -1:200000 IJ SOLN
INTRAMUSCULAR | Status: DC | PRN
  Administered 2023-07-07: 10 mL

## 2023-07-07 MED ORDER — VANCOMYCIN HCL IN DEXTROSE 1-5 GM/200ML-% IV SOLN
INTRAVENOUS | Status: AC
  Filled 2023-07-07: qty 200

## 2023-07-07 SURGICAL SUPPLY — 70 items
ADH LQ OCL WTPRF AMP STRL LF (MISCELLANEOUS) ×20
ADH SKN CLS APL DERMABOND .7 (GAUZE/BANDAGES/DRESSINGS)
ADHESIVE MASTISOL STRL (MISCELLANEOUS) IMPLANT
AGENT HMST KT MTR STRL THRMB (HEMOSTASIS) ×2
ALLOGRAFT BONE FIBER KORE 5 (Bone Implant) IMPLANT
BASIN KIT SINGLE STR (MISCELLANEOUS) ×2 IMPLANT
BRUSH SCRUB EZ 4% CHG (MISCELLANEOUS) ×2 IMPLANT
BUR NEURO DRILL SOFT 3.0X3.8M (BURR) ×2 IMPLANT
COVERAGE SUPP BRAINLAB NG SPNE (MISCELLANEOUS) IMPLANT
COVERAGE SUPPORT SPINE BRAINLB (MISCELLANEOUS)
DERMABOND ADVANCED .7 DNX12 (GAUZE/BANDAGES/DRESSINGS) ×2 IMPLANT
DRAPE C-ARM XRAY 36X54 (DRAPES) IMPLANT
DRAPE C-ARMOR (DRAPES) IMPLANT
DRAPE HD 5FT BACK TABLE (DRAPES) IMPLANT
DRAPE LAPAROTOMY 100X77 ABD (DRAPES) ×2 IMPLANT
DRAPE SCAN PATIENT (DRAPES) ×2 IMPLANT
ELECT CAUTERY BLADE TIP 2.5 (TIP) ×2
ELECT REM PT RETURN 9FT ADLT (ELECTROSURGICAL) ×4
ELECTRODE CAUTERY BLDE TIP 2.5 (TIP) IMPLANT
ELECTRODE REM PT RTRN 9FT ADLT (ELECTROSURGICAL) ×2 IMPLANT
EVACUATOR 1/8 PVC DRAIN (DRAIN) IMPLANT
EX-PIN ORTHOLOCK NAV 4X150 (PIN) IMPLANT
FEE CVG SUPP BRAINLAB NG SPNE (MISCELLANEOUS) IMPLANT
FEE INTRAOP CADWELL SUPPLY NCS (MISCELLANEOUS) IMPLANT
FEE INTRAOP MONITOR IMPULS NCS (MISCELLANEOUS) IMPLANT
GLOVE BIOGEL PI IND STRL 6.5 (GLOVE) ×2 IMPLANT
GLOVE BIOGEL PI IND STRL 8 (GLOVE) ×2 IMPLANT
GLOVE SURG SYN 6.5 ES PF (GLOVE) IMPLANT
GLOVE SURG SYN 6.5 PF PI (GLOVE) ×4 IMPLANT
GLOVE SURG SYN 7.5 E (GLOVE) ×2 IMPLANT
GLOVE SURG SYN 7.5 PF PI (GLOVE) ×2 IMPLANT
GOWN SRG LRG LVL 4 IMPRV REINF (GOWNS) ×2 IMPLANT
GOWN SRG XL LVL 3 NONREINFORCE (GOWNS) ×2 IMPLANT
GOWN STRL NON-REIN TWL XL LVL3 (GOWNS) ×4
GOWN STRL REIN LRG LVL4 (GOWNS)
HOLDER FOLEY CATH W/STRAP (MISCELLANEOUS) ×2 IMPLANT
INTRAOP CADWELL SUPPLY FEE NCS (MISCELLANEOUS) ×2
INTRAOP DISP SUPPLY FEE NCS (MISCELLANEOUS) ×2
INTRAOP MONITOR FEE IMPULS NCS (MISCELLANEOUS) ×2
JET LAVAGE IRRISEPT WOUND (IRRIGATION / IRRIGATOR) ×2
KIT PREVENA INCISION MGT 13 (CANNISTER) IMPLANT
KIT SPINAL PRONEVIEW (KITS) ×2 IMPLANT
LAVAGE JET IRRISEPT WOUND (IRRIGATION / IRRIGATOR) ×2 IMPLANT
MANIFOLD NEPTUNE II (INSTRUMENTS) ×2 IMPLANT
MARKER SKIN DUAL TIP RULER LAB (MISCELLANEOUS) ×2 IMPLANT
MARKER SPHERE PSV REFLC 13MM (MARKER) ×14 IMPLANT
NDL SAFETY ECLIPSE 18X1.5 (NEEDLE) ×2 IMPLANT
NS IRRIG 1000ML POUR BTL (IV SOLUTION) ×2 IMPLANT
NS IRRIG 500ML POUR BTL (IV SOLUTION) IMPLANT
PACK LAMINECTOMY ARMC (PACKS) ×2 IMPLANT
PAD ARMBOARD 7.5X6 YLW CONV (MISCELLANEOUS) ×2 IMPLANT
PENCIL SMOKE EVACUATOR (MISCELLANEOUS) IMPLANT
ROD RELINE-O LORD 5.5X40 (Rod) IMPLANT
SCREW LOCK RELINE 5.5 TULIP (Screw) IMPLANT
SCREW RELINE-O POLY 6.5X45 (Screw) IMPLANT
SCREW RELINE-O POLY 6.5X50MM (Screw) IMPLANT
SURGIFLO W/THROMBIN 8M KIT (HEMOSTASIS) ×2 IMPLANT
SUT DVC VLOC 3-0 CL 6 P-12 (SUTURE) ×2 IMPLANT
SUT ETHILON 3-0 FS-10 30 BLK (SUTURE) ×2
SUT V-LOC 90 ABS DVC 3-0 CL (SUTURE) IMPLANT
SUT VIC AB 0 CT1 27 (SUTURE) ×4
SUT VIC AB 0 CT1 27XCR 8 STRN (SUTURE) ×4 IMPLANT
SUT VIC AB 2-0 CT1 18 (SUTURE) ×4 IMPLANT
SUTURE EHLN 3-0 FS-10 30 BLK (SUTURE) IMPLANT
SYR 10ML LL (SYRINGE) ×2 IMPLANT
SYR 30ML LL (SYRINGE) ×4 IMPLANT
TOWEL OR 17X26 4PK STRL BLUE (TOWEL DISPOSABLE) IMPLANT
TRAP FLUID SMOKE EVACUATOR (MISCELLANEOUS) ×2 IMPLANT
TRAY FOLEY SLVR 16FR LF STAT (SET/KITS/TRAYS/PACK) IMPLANT
WATER STERILE IRR 1000ML POUR (IV SOLUTION) ×4 IMPLANT

## 2023-07-07 NOTE — Anesthesia Procedure Notes (Signed)
Procedure Name: Intubation Date/Time: 07/07/2023 10:09 AM  Performed by: Katherine Basset, CRNAPre-anesthesia Checklist: Patient identified, Emergency Drugs available, Suction available and Patient being monitored Patient Re-evaluated:Patient Re-evaluated prior to induction Oxygen Delivery Method: Circle system utilized Preoxygenation: Pre-oxygenation with 100% oxygen Induction Type: IV induction Ventilation: Mask ventilation without difficulty Laryngoscope Size: Miller and 2 Grade View: Grade I Tube type: Oral Tube size: 7.0 mm Number of attempts: 1 Airway Equipment and Method: Stylet, Oral airway and Bite block Placement Confirmation: ETT inserted through vocal cords under direct vision, positive ETCO2 and breath sounds checked- equal and bilateral Secured at: 21 cm Tube secured with: Tape Dental Injury: Teeth and Oropharynx as per pre-operative assessment

## 2023-07-07 NOTE — Plan of Care (Signed)
CHL Tonsillectomy/Adenoidectomy, Postoperative PEDS care plan entered in error.

## 2023-07-07 NOTE — Anesthesia Preprocedure Evaluation (Signed)
Anesthesia Evaluation  Patient identified by MRN, date of birth, ID band Patient awake    Reviewed: Allergy & Precautions, H&P , NPO status , Patient's Chart, lab work & pertinent test results, reviewed documented beta blocker date and time   History of Anesthesia Complications (+) history of anesthetic complications  Airway Mallampati: III  TM Distance: >3 FB Neck ROM: full    Dental  (+) Teeth Intact   Pulmonary shortness of breath and with exertion, sleep apnea    Pulmonary exam normal        Cardiovascular Exercise Tolerance: Poor hypertension, On Medications negative cardio ROS Atrial Fibrillation  Rhythm:regular Rate:Normal     Neuro/Psych  PSYCHIATRIC DISORDERS  Depression     Neuromuscular disease CVA    GI/Hepatic Neg liver ROS,GERD  Medicated,,  Endo/Other  Hypothyroidism  Morbid obesity  Renal/GU negative Renal ROS  negative genitourinary   Musculoskeletal   Abdominal   Peds  Hematology  (+) Blood dyscrasia, anemia   Anesthesia Other Findings Past Medical History: No date: A-fib (HCC) No date: Anemia No date: B12 deficiency 03/31/2013: Breast cancer (HCC)     Comment:  Right - chemo- mastectomy 1991: Breast cancer (HCC)     Comment:  Rt.- radiation 01/2009: Complication of anesthesia     Comment:  Recent years with general anesthesia had itching               following surgery No date: DDD (degenerative disc disease), cervical No date: Depression No date: Diverticulitis No date: Dyspnea No date: Edema of both legs No date: GERD (gastroesophageal reflux disease) No date: Gout 2014: H/O mastectomy     Comment:  per patient 53: H/O: cesarean section     Comment:  per patient report 52: H/O: hysterectomy     Comment:  per patient No date: Hydradenitis No date: Hypertension No date: Hypothyroidism No date: Neuropathy 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) No date: Parkinson's disease  (HCC) No date: Personal history of chemotherapy No date: Personal history of radiation therapy 2002: Ruptured cervical disc     Comment:  per patient  No date: Sleep apnea     Comment:  cpap machine No date: Super obese No date: Tachycardia Past Surgical History: No date: ABDOMINAL HYSTERECTOMY 2002: BACK SURGERY     Comment:  plate and 2 screws in neck [id number]: BREAST BIOPSY; Right     Comment:  Positive No date: BREAST CYST ASPIRATION; Left 2014: BREAST SURGERY; Right     Comment:  mastectomy 05/17/2019: CATARACT EXTRACTION W/PHACO; Left     Comment:  Procedure: CATARACT EXTRACTION PHACO AND INTRAOCULAR               LENS PLACEMENT (IOC);  Surgeon: Nevada Crane, MD;                Location: [facility] ORS;  Service: Ophthalmology;  Laterality:              Left;  [place] 00:28 CDE 2.15 FLUID PACK LOT # [id number] 06/14/2019: CATARACT EXTRACTION W/PHACO; Right     Comment:  Procedure: CATARACT EXTRACTION PHACO AND INTRAOCULAR               LENS PLACEMENT (IOC) RIGHT;  Surgeon: Nevada Crane,              MD;  Location: [facility];  Service: Ophthalmology;                Laterality: Right;  [place] 00:35.0  CDE 2.37 Fluid Pack Lot               #[id number] No date: CESAREAN SECTION 04/09/2012: CHOLECYSTECTOMY 06/22/2015: COLONOSCOPY WITH PROPOFOL; N/A     Comment:  Procedure: COLONOSCOPY WITH PROPOFOL;  Surgeon: Scot Jun, MD;  Location: [facility] ENDOSCOPY;  Service:               Endoscopy;  Laterality: N/A; 05/25/2021: COLONOSCOPY WITH PROPOFOL; N/A     Comment:  Procedure: COLONOSCOPY WITH PROPOFOL;  Surgeon:               Regis Bill, MD;  Location: [facility] ENDOSCOPY;                Service: Endoscopy;  Laterality: N/A; No date: ESOPHAGOGASTRODUODENOSCOPY 05/25/2021: ESOPHAGOGASTRODUODENOSCOPY; N/A     Comment:  Procedure: ESOPHAGOGASTRODUODENOSCOPY (EGD);  Surgeon:               Regis Bill, MD;  Location: [facility] ENDOSCOPY;                Service:  Endoscopy;  Laterality: N/A; No date: EYE SURGERY No date: LAPAROSCOPIC GASTRIC BYPASS No date: MASTECTOMY; Right No date: MASTECTOMY MODIFIED RADICAL No date: mastectomy partial      Comment:  lumpectomy No date: NEUROPLASTY / TRANSPOSITION ULNAR NERVE AT ELBOW No date: OOPHORECTOMY 05/13/2015: PORT-A-CATH REMOVAL; Left     Comment:  Procedure: REMOVAL PORT-A-CATH LEFT CHEST ;  Surgeon:               Lattie Haw, MD;  Location: [facility];  Service:               General;  Laterality: Left; No date: REDUCTION MAMMAPLASTY; Left 12/03/2020: REVERSE SHOULDER ARTHROPLASTY; Right     Comment:  Procedure: REVERSE SHOULDER ARTHROPLASTY;  Surgeon:               Christena Flake, MD;  Location: [facility] OR[facility];  Service:               Orthopedics;  Laterality: Right; 01/26/2009: ROUX-EN-Y GASTRIC BYPASS 01/03/2017: SHOULDER ARTHROSCOPY WITH OPEN ROTATOR CUFF REPAIR; Right     Comment:  Procedure: SHOULDER ARTHROSCOPY WITH OPEN ROTATOR CUFF               REPAIR;  Surgeon: Christena Flake, MD;  Location: [facility] OR[facility];               Service: Orthopedics;  Laterality: Right; 01/03/2017: SHOULDER ARTHROSCOPY WITH SUBACROMIAL DECOMPRESSION,  ROTATOR CUFF REPAIR AND BICEP TENDON REPAIR; Right     Comment:  Procedure: SHOULDER ARTHROSCOPY WITH SUBACROMIAL               DECOMPRESSION, ROTATOR CUFF REPAIR AND BICEP TENDON               REPAIR;  Surgeon: Christena Flake, MD;  Location: [facility] ORS;               Service: Orthopedics;  Laterality: Right;  Limited               debridement No date: SPINE SURGERY BMI    Body Mass Index: 37.76 kg/m     Reproductive/Obstetrics negative OB ROS                             Anesthesia Physical Anesthesia Plan  ASA: 4 and emergent  Anesthesia Plan: General ETT   Post-op Pain Management:    Induction:   PONV Risk Score and Plan: 4 or greater  Airway Management Planned:   Additional Equipment:   Intra-op Plan:   Post-operative Plan:    Informed Consent: I have reviewed the patients History and Physical, chart, labs and discussed the procedure including the risks, benefits and alternatives for the proposed anesthesia with the patient or authorized representative who has indicated his/her understanding and acceptance.     Dental Advisory Given  Plan Discussed with: CRNA  Anesthesia Plan Comments:        Anesthesia Quick Evaluation

## 2023-07-07 NOTE — Transfer of Care (Signed)
Immediate Anesthesia Transfer of Care Note  Patient: Suzanne Dixon  Procedure(s) Performed: T10-T11 Laminectomy, transpedicular Decompression, T10-11 posterior spinal fusion (Spine Thoracic) APPLICATION OF INTRAOPERATIVE CT SCAN  Patient Location: PACU  Anesthesia Type:General  Level of Consciousness: drowsy  Airway & Oxygen Therapy: Patient Spontanous Breathing and Patient connected to face mask oxygen  Post-op Assessment: Report given to RN, Post -op Vital signs reviewed and stable, and Patient moving all extremities  Post vital signs: Reviewed and stable  Last Vitals:  Vitals Value Taken Time  BP 108/61 07/07/23 1400  Temp    Pulse 65 07/07/23 1404  Resp 15 07/07/23 1404  SpO2 100 % 07/07/23 1404  Vitals shown include unfiled device data.  Last Pain:  Vitals:   07/07/23 0902  TempSrc: Oral  PainSc: 0-No pain         Complications: No notable events documented.

## 2023-07-07 NOTE — Op Note (Signed)
Indications: Ms. Suzanne Dixon is suffering from severe thoracic myelopathy secondary to thoracic cord compression.  Given the patient's severe weakness in the right lower extremity with ongoing compression she was recommended to undergo a thoracic decompression and fusion.  Findings: Severe compression of the thoracic spinal cord secondary to disco ligamentous hypertrophy, facet hypertrophy, and longstanding spondylosis.  Preoperative Diagnosis:  Thoracic cord compression secondary to spondylitic changes Thoracic myelopathy Lower extremity weakness  Postoperative Diagnosis: same   EBL:50 ml IVF: See anesthesia report Drains: Subfascial drain placed  Disposition: Extubated and Stable to PACU Complications: none  A foley catheter was placed.   Preoperative Note:   Risks of surgery discussed include: infection, bleeding, stroke, coma, death, paralysis, CSF leak, nerve/spinal cord injury, numbness, tingling, weakness, complex regional pain syndrome, recurrent stenosis and/or disc herniation, vascular injury, development of instability, neck/back pain, need for further surgery, persistent symptoms, development of deformity, and the risks of anesthesia. The patient understood these risks and agreed to proceed.  Operative Note:  1.  Transpedicular decompression of the thoracic spinal cord 2.  Posterior spinal instrumentation, T10-11 3.   Posterior lateral arthrodesis T10-11 4.   Allograft placement 5.   Harvest and placement of autograft 6.   Use of spinal stereotaxy   The patient was brought to the Operating Room, intubated and turned into the prone position. All pressure points were checked and double checked.  Her skin was in poor condition preoperatively with multiple folds with evidence of breakdown.  Her pannus was supported and padded extensively.  Flouroscopy was used to mark the incision. The patient was prepped and draped in the standard fashion. A full timeout was performed.  Preoperative antibiotics were given. The incision was injected with local anesthetic.   The incision was opened with a scalpel, then the soft tissues divided with the Bovie. Self-retaining retractors were placed. The paraspinus muscles were reflected laterally in subperiosteal fashion until the transverse processes were visible. Flouroscopy was used to confirm our localization.  The stereotactic array was placed.  Stereotactic images were acquired and registered to the patient.  We then used stereotactically guided drill guides to cannulate the pedicles bilaterally from T10 to T11.   We placed the screws at the right side T10 and the left sided T10 and 11.  We kept the screw out of the right sided T11 pedicle to help with the transpedicular decompression.  The posterior spinal elements including the spinous process was removed with a rongeur.  We then utilized a high-speed drill to perform a central laminectomy.  We brought this out laterally until we are able to isolate the pars of T10 on the right.  A pars cut was made laterally.  This way we were able to remove the inferior facet of T10.  We are then left with the superior facet of T11.  This was also removed with a linear cut.  We then utilized a high-speed drill to remove at least 50% of the pedicle so that we are able to get a clean access site to the disc space.  At this point we turned our attention to the contralateral decompression.  We carried this out laterally and resected the facet in the same fashion.  On the left side specifically there is a significant amount of overgrown connective tissue causing severe ventrolateral compression of the spinal cord.  This was slowly resected until adequate decompression was noted.  On the right side the disc was entered and drilled to create space for the  palpable disc herniation.  We are able to then reduce the disc herniation into this defect resulting in good decompression of the thoracic spine.  We then  placed the right sided T11 screw with good purchase.  At this time we are able to place the rods on each side.  We fixed these into place with set screws and set caps.  Then to verify our imaging we repeated a intraoperative navigation scan.  This helped [place] to verify the placement of the pedicle screws.  Pedicles were all placed within the pedicles.  At this point we were able to final tightened each of the set screw caps.  We obtained meticulous hemostasis.  We irrigated with antibiotic/antiseptic solution.  We then performed a posterior lateral arthrodesis with a high-speed drill and placed autograft and allograft bone to promote a bony fusion on the posterior lateral sites.   A drain was placed subfascially.   After hemostasis, the wound was closed in layers with 0 and 2-0 vicryl. 3-0 monocryl and a Prevena wound VAC was applied to the incision.  The patient was then flipped supine and extubated with incident. All counts were correct times 2 at the end of the case. No immediate complications were noted.  Venetia Night, MD assisted in the entire procedure. An assistant was required for this procedure due to the complexity.  The assistant provided assistance in tissue manipulation and suction, and was required for the successful and safe performance of the procedure. I performed the critical portions of the procedure.   Lovenia Kim, MD

## 2023-07-07 NOTE — Interval H&P Note (Signed)
History and Physical Interval Note:  07/07/2023 9:16 AM  Suzanne Dixon  has presented today for surgery, with the diagnosis of M47.14  Thoracic spondylosis with myelopathy M48.04  Thoracic spinal stenosis R29.898  Weakness of both lower extremities.  The various methods of treatment have been discussed with the patient and family. After consideration of risks, benefits and other options for treatment, the patient has consented to  Procedure(s): T10-T11 Laminectomy, transpedicular Decompression, T10-11 posterior spinal fusion, possible extension to T12. (N/A) EXTENSION OF FUSION TO L1 (N/A) APPLICATION OF INTRAOPERATIVE CT SCAN (N/A) as a surgical intervention.  The patient's history has been reviewed, patient examined, no change in status, stable for surgery.  I have reviewed the patient's chart and labs.  Questions were answered to the patient's satisfaction.    Heart and lungs clear to auscultation  Lovenia Kim

## 2023-07-07 NOTE — Progress Notes (Signed)
Progress Note   Patient: Suzanne Dixon ZOX:[id number] DOB: 22-Jan-1951 DOA: 06/29/2023     1 DOS: the patient was seen and examined on 07/07/2023   Brief hospital course:  LEINAALA BUCKRIDGE is a 72 y.o. female with medical history significant of recurrent UTI, Parkinson's disease, hypertension, OSA on CPAP, atrial fibrillation on Warfarin, lymphedema, degenerative disc disease, breast cancer s/p chemo mastectomy and radiation, hypothyroidism, neuropathy, gout, who presents to the ED due to back pain.  Lumbar MRI showed severe spinal stenosis on L3-L4.  Patient also has significant right leg weakness. Consulted neurosurgery, MRI of the thoracic spine was also performed, showed severe stenosis in T11-T12 which was responsible for leg weakness.  Surgery 10/18.    Principal Problem:   Acute back pain Active Problems:   Acute cystitis   AF (paroxysmal atrial fibrillation) (HCC)   Hypertension   Obesity (BMI 30-39.9)   Apnea, sleep   Breast cancer (HCC)   Large granular lymphocytic leukemia (HCC)   Lymphedema   CVA (cerebral vascular accident) (HCC)   Hypothyroidism   Parkinsonism (HCC)   Thoracic spondylosis with myelopathy   Weakness of both lower extremities   Spinal stenosis, thoracic   Spinal stenosis of thoracic region   Assessment and Plan: * Acute back pain with severe lumbar stenosis. Severe thoracic spine stenosis. Patient has been treated with pain medicine, pain is better.  However, patient still has significant right leg weakness.  Consult from neurosurgery obtained, MRI of the thoracic spine was also obtained, showed severe stenosis in T 11 to T12, which he appeared to be the source of right leg weakness.  Seen patient this morning, medically stable for surgery.   Acute cystitis Recently patient has been treated with acute UTI.  Blood culture at this time was negative, no need of antibiotics.     AF (paroxysmal atrial fibrillation) (HCC) - Decrease home Metoprolol given  bradycardia Warfarin discontinued due to scheduled surgery.  Received 2 FFP in the morning.  INR dropped down to 1.6.   Parkinsonism (HCC) - Continue home regimen   Hypothyroidism - Continue home regimen   Large granular lymphocytic leukemia (HCC) Follows with oncology, currently under surveillance only.  No evidence of osseous lesion seen on CT imaging.   Obstruct sleep apnea Morbid obesity with BMI 40.11. - CPAP at bedtime   Hypertension - Continue home regimen        Subjective:  Patient doing well today, still complaining of right leg weakness.  Physical Exam: Vitals:   07/06/23 2204 07/07/23 0323 07/07/23 0752 07/07/23 0902  BP: (!) 115/91 129/87 (!) 134/91 127/82  Pulse: 92 80 79 73  Resp: 18 17 19 18   Temp: 97.7 F (36.5 C) 97.9 F (36.6 C) 97.6 F (36.4 C) 97.8 F (36.6 C)  TempSrc: Oral   Oral  SpO2: 97% 97% 97% 98%  Weight:    99.8 kg  Height:    5\' 4"  (1.626 m)   General exam: Appears calm and comfortable  Respiratory system: Clear to auscultation. Respiratory effort normal. Cardiovascular system: S1 & S2 heard, RRR. No JVD, murmurs, rubs, gallops or clicks. No pedal edema. Gastrointestinal system: Abdomen is nondistended, soft and nontender. No organomegaly or masses felt. Normal bowel sounds heard. Central nervous system: Alert and oriented. No focal neurological deficits. Extremities: Right leg weakness. Skin: No rashes, lesions or ulcers Psychiatry: Judgement and insight appear normal. Mood & affect appropriate.    Data Reviewed:  Lab results reviewed.  Family Communication: Daughter  updated over the phone.  Disposition: Status is: Inpatient Remains inpatient appropriate because: Severity of disease, inpatient procedure.     Time spent: 35 minutes  Author: Marrion Coy, MD 07/07/2023 12:10 PM  For on call review [email or web address].

## 2023-07-08 ENCOUNTER — Encounter: Payer: Self-pay | Admitting: Neurosurgery

## 2023-07-08 DIAGNOSIS — M4804 Spinal stenosis, thoracic region: Secondary | ICD-10-CM | POA: Diagnosis not present

## 2023-07-08 DIAGNOSIS — I480 Paroxysmal atrial fibrillation: Secondary | ICD-10-CM | POA: Diagnosis not present

## 2023-07-08 DIAGNOSIS — I10 Essential (primary) hypertension: Secondary | ICD-10-CM | POA: Diagnosis not present

## 2023-07-08 LAB — BASIC METABOLIC PANEL
Anion gap: 9 (ref 5–15)
BUN: 27 mg/dL — ABNORMAL HIGH (ref 8–23)
CO2: 28 mmol/L (ref 22–32)
Calcium: 8.5 mg/dL — ABNORMAL LOW (ref 8.9–10.3)
Chloride: 99 mmol/L (ref 98–111)
Creatinine, Ser: 0.68 mg/dL (ref 0.44–1.00)
GFR, Estimated: >60 mL/min (ref >60)
Glucose, Bld: 122 mg/dL — ABNORMAL HIGH (ref 70–99)
Potassium: 4.9 mmol/L (ref 3.5–5.1)
Sodium: 136 mmol/L (ref 135–145)

## 2023-07-08 LAB — CBC
HCT: 36 % (ref 36.0–46.0)
Hemoglobin: 12.5 g/dL (ref 12.0–15.0)
MCH: 31.2 pg (ref 26.0–34.0)
MCHC: 34.7 g/dL (ref 30.0–36.0)
MCV: 89.8 fL (ref 80.0–100.0)
Platelets: 244 10*3/uL (ref 150–400)
RBC: 4.01 MIL/uL (ref 3.87–5.11)
RDW: 13.7 % (ref 11.5–15.5)
WBC: 14.1 10*3/uL — ABNORMAL HIGH (ref 4.0–10.5)
nRBC: 0 % (ref 0.0–0.2)

## 2023-07-08 LAB — PREPARE FRESH FROZEN PLASMA: Unit division: 0

## 2023-07-08 LAB — TYPE AND SCREEN
ABO/RH(D): B POS
Antibody Screen: NEGATIVE

## 2023-07-08 LAB — BPAM FFP
Blood Product Expiration Date: 202410232359
ISSUE DATE / TIME: 202410181113
Unit Type and Rh: 7300

## 2023-07-08 LAB — MAGNESIUM: Magnesium: 1.9 mg/dL (ref 1.7–2.4)

## 2023-07-08 MED ORDER — CHLORHEXIDINE GLUCONATE CLOTH 2 % EX PADS
6.0000 | MEDICATED_PAD | Freq: Every day | CUTANEOUS | Status: DC
  Administered 2023-07-08 – 2023-07-13 (×6): 6 via TOPICAL

## 2023-07-08 NOTE — Progress Notes (Signed)
Consulting Department:  Internal medicine  Primary Physician:  Lynnea Ferrier, MD  Chief Complaint: Right lower extremity weakness, severe spinal stenosis, status post thoracic decompression and fusion at T1011  History of Present Illness: 07/08/2023\  Patient was taken to the operating room on 07/07/2023  She had a T10-11 transpedicular decompression and fusion for severe spinal stenosis with weakness.  Overall this morning her pain is improved.  She feels like the pain radiating down to her legs has improved significantly.  She also noticed that she is having improved strength in her right lower extremity.  Overall she is very pleased.   Review of Systems:  A 10 point review of systems is negative, except for the pertinent positives and negatives detailed in the HPI.  Past Medical History: Past Medical History:  Diagnosis Date   A-fib (HCC)    Anemia    B12 deficiency    Breast cancer (HCC) 03/31/2013   Right - chemo- mastectomy   Breast cancer (HCC) 1991   Rt.- radiation   Complication of anesthesia 01/2009   Recent years with general anesthesia had itching following surgery   DDD (degenerative disc disease), cervical    Depression    Diverticulitis    Dyspnea    Edema of both legs    GERD (gastroesophageal reflux disease)    Gout    H/O mastectomy 2014   per patient   H/O: cesarean section 1987   per patient report   H/O: hysterectomy 1996   per patient   Hydradenitis    Hypertension    Hypothyroidism    Neuropathy    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HCC)    Parkinson's disease (HCC)    Personal history of chemotherapy    Personal history of radiation therapy    Ruptured cervical disc 2002   per patient    Sleep apnea    cpap machine   Super obese    Tachycardia     Past Surgical History: Past Surgical History:  Procedure Laterality Date   ABDOMINAL HYSTERECTOMY     APPLICATION OF INTRAOPERATIVE CT SCAN N/A 07/07/2023   Procedure: APPLICATION OF  INTRAOPERATIVE CT SCAN;  Surgeon: Lovenia Kim, MD;  Location: [facility];  Service: Neurosurgery;  Laterality: N/A;   BACK SURGERY  2002   plate and 2 screws in neck   BREAST BIOPSY Right 1991,2014   Positive   BREAST CYST ASPIRATION Left    BREAST SURGERY Right 2014   mastectomy   CATARACT EXTRACTION W/PHACO Left 05/17/2019   Procedure: CATARACT EXTRACTION PHACO AND INTRAOCULAR LENS PLACEMENT (IOC);  Surgeon: Nevada Crane, MD;  Location: [facility];  Service: Ophthalmology;  Laterality: Left;  [place] 00:28 CDE 2.15 FLUID PACK LOT # [id number]   CATARACT EXTRACTION W/PHACO Right 06/14/2019   Procedure: CATARACT EXTRACTION PHACO AND INTRAOCULAR LENS PLACEMENT (IOC) RIGHT;  Surgeon: Nevada Crane, MD;  Location: [facility];  Service: Ophthalmology;  Laterality: Right;  [place] 00:35.0 CDE 2.37 Fluid Pack Lot #[id number]   CESAREAN SECTION     CHOLECYSTECTOMY  04/09/2012   COLONOSCOPY WITH PROPOFOL N/A 06/22/2015   Procedure: COLONOSCOPY WITH PROPOFOL;  Surgeon: Scot Jun, MD;  Location: [facility];  Service: Endoscopy;  Laterality: N/A;   COLONOSCOPY WITH PROPOFOL N/A 05/25/2021   Procedure: COLONOSCOPY WITH PROPOFOL;  Surgeon: Regis Bill, MD;  Location: [facility];  Service: Endoscopy;  Laterality: N/A;   ESOPHAGOGASTRODUODENOSCOPY     ESOPHAGOGASTRODUODENOSCOPY N/A 05/25/2021   Procedure: ESOPHAGOGASTRODUODENOSCOPY (EGD);  Surgeon: Regis Bill, MD;  Location: [facility];  Service: Endoscopy;  Laterality: N/A;   EYE SURGERY     LAPAROSCOPIC GASTRIC BYPASS     MASTECTOMY Right    MASTECTOMY MODIFIED RADICAL     mastectomy partial      lumpectomy   NEUROPLASTY / TRANSPOSITION ULNAR NERVE AT ELBOW     OOPHORECTOMY     PORT-A-CATH REMOVAL Left 05/13/2015   Procedure: REMOVAL PORT-A-CATH LEFT CHEST ;  Surgeon: Lattie Haw, MD;  Location: [facility];  Service: General;  Laterality: Left;   REDUCTION MAMMAPLASTY Left    REVERSE SHOULDER ARTHROPLASTY Right  12/03/2020   Procedure: REVERSE SHOULDER ARTHROPLASTY;  Surgeon: Christena Flake, MD;  Location: [facility];  Service: Orthopedics;  Laterality: Right;   ROUX-EN-Y GASTRIC BYPASS  01/26/2009   SHOULDER ARTHROSCOPY WITH OPEN ROTATOR CUFF REPAIR Right 01/03/2017   Procedure: SHOULDER ARTHROSCOPY WITH OPEN ROTATOR CUFF REPAIR;  Surgeon: Christena Flake, MD;  Location: [facility];  Service: Orthopedics;  Laterality: Right;   SHOULDER ARTHROSCOPY WITH SUBACROMIAL DECOMPRESSION, ROTATOR CUFF REPAIR AND BICEP TENDON REPAIR Right 01/03/2017   Procedure: SHOULDER ARTHROSCOPY WITH SUBACROMIAL DECOMPRESSION, ROTATOR CUFF REPAIR AND BICEP TENDON REPAIR;  Surgeon: Christena Flake, MD;  Location: [facility];  Service: Orthopedics;  Laterality: Right;  Limited debridement   SPINE SURGERY      Allergies: Allergies as of 06/29/2023 - Review Complete 06/29/2023  Allergen Reaction Noted   Advair diskus [fluticasone-salmeterol] Other (See Comments) 06/19/2015   Alendronate  05/13/2019   Singulair [montelukast] Other (See Comments) 01/19/2015   Sulfa antibiotics Hives 01/19/2015   Venlafaxine Other (See Comments) 01/19/2015    Medications:  Current Facility-Administered Medications:    acetaminophen (TYLENOL) tablet 650 mg, 650 mg, Oral, Q6H, Verdene Lennert, MD, 650 mg at 07/08/23 1145   allopurinol (ZYLOPRIM) tablet 300 mg, 300 mg, Oral, Daily, Verdene Lennert, MD, 300 mg at 07/08/23 1144   amLODipine (NORVASC) tablet 10 mg, 10 mg, Oral, Daily, Wouk, Wilfred Curtis, MD, 10 mg at 07/08/23 1144   calcium-vitamin D (OSCAL WITH D) 500-5 MG-MCG per tablet, , Oral, BID WC, Verdene Lennert, MD, 1 tablet at 07/08/23 1144   carbidopa-levodopa (SINEMET IR) 25-100 MG per tablet immediate release 2 tablet, 2 tablet, Oral, TID, Verdene Lennert, MD, 2 tablet at 07/08/23 1144   cyanocobalamin (VITAMIN B12) tablet 1,000 mcg, 1,000 mcg, Oral, Daily, Verdene Lennert, MD, 1,000 mcg at 07/08/23 1145   DULoxetine (CYMBALTA) DR capsule 60 mg, 60  mg, Oral, BID, Verdene Lennert, MD, 60 mg at 07/08/23 1145   feeding supplement (ENSURE ENLIVE / ENSURE PLUS) liquid 237 mL, 237 mL, Oral, BID BM, Verdene Lennert, MD, 237 mL at 07/06/23 2205   gabapentin (NEURONTIN) capsule 300 mg, 300 mg, Oral, QID, Verdene Lennert, MD, 300 mg at 07/08/23 1145   HYDROcodone-acetaminophen (NORCO/VICODIN) 5-325 MG per tablet 1 tablet, 1 tablet, Oral, Q6H PRN, Verdene Lennert, MD   HYDROmorphone (DILAUDID) injection 0.5 mg, 0.5 mg, Intravenous, Q4H PRN, Marrion Coy, MD, 0.5 mg at 07/08/23 0544   levothyroxine (SYNTHROID) tablet 50 mcg, 50 mcg, Oral, QAC breakfast, Verdene Lennert, MD, 50 mcg at 07/08/23 0544   lidocaine (LIDODERM) 5 % 1 patch, 1 patch, Transdermal, Q24H, Chesley Noon, MD, 1 patch at 07/05/23 0843   magnesium oxide (MAG-OX) tablet 400 mg, 400 mg, Oral, Daily, Verdene Lennert, MD, 400 mg at 07/08/23 1145   meloxicam (MOBIC) tablet 15 mg, 15 mg, Oral, Daily, Verdene Lennert, MD, 15 mg at 07/08/23 1146  methocarbamol (ROBAXIN) tablet 500 mg, 500 mg, Oral, Q6H PRN, Verdene Lennert, MD, 500 mg at 07/06/23 1820   metoprolol tartrate (LOPRESSOR) tablet 25 mg, 25 mg, Oral, BID, Verdene Lennert, MD, 25 mg at 07/08/23 1145   ondansetron (ZOFRAN) tablet 4 mg, 4 mg, Oral, Q6H PRN **OR** ondansetron (ZOFRAN) injection 4 mg, 4 mg, Intravenous, Q6H PRN, Verdene Lennert, MD   pantoprazole (PROTONIX) EC tablet 40 mg, 40 mg, Oral, Daily, Marrion Coy, MD, 40 mg at 07/08/23 1145   polyethylene glycol (MIRALAX / GLYCOLAX) packet 17 g, 17 g, Oral, Daily PRN, Verdene Lennert, MD, 17 g at 07/02/23 1202   polyethylene glycol (MIRALAX / GLYCOLAX) packet 17 g, 17 g, Oral, Daily, Wouk, Wilfred Curtis, MD, 17 g at 07/08/23 1142   simethicone (MYLICON) chewable tablet 80 mg, 80 mg, Oral, Q6H PRN, Wouk, Wilfred Curtis, MD   Social History: Social History   Tobacco Use   Smoking status: Never   Smokeless tobacco: Never  Substance Use Topics   Alcohol use: No     Alcohol/week: 0.0 standard drinks of alcohol   Drug use: No    Family Medical History: Family History  Problem Relation Age of Onset   Hypertension Mother    Lymphoma Mother    Cancer Sister        Breast   Breast cancer Sister 17    Physical Examination: Vitals:   07/08/23 0112 07/08/23 0403  BP: 121/80 124/67  Pulse: 75 70  Resp: 18 18  Temp: 97.8 F (36.6 C) 98.4 F (36.9 C)  SpO2:       General: Patient is well developed, well nourished, calm, collected, and in no apparent distress.  NEUROLOGICAL:  General: In no acute distress.   Awake, alert, oriented to person, place, and time.  Pupils equal round and reactive to light.  Facial tone is symmetric.  Tongue protrusion is midline.  There is no pronator drift.  Strength: Side Biceps Triceps Deltoid Interossei Grip Wrist Ext. Wrist Flex.  R 5 5 5 5 5 5 5   L 5 5 5 5 5 5 5    Side Iliopsoas Quads Hamstring PF DF EHL  R 2 3 3  4- 3 4-  L 3 4 4  4+ 4+ 4+    T10 Sensory level, decreased propioception on the right lower extremity.   Reflexes are decreased and symmetric at the biceps, triceps, brachioradialis, patella and achilles. Hoffman's is absent. Clonus is not present.  Toes are mute.  Imaging:   Narrative & Impression  CLINICAL DATA:  Myelopathy, acute, lumbar spine. Worsening back pain.   EXAM: MRI LUMBAR SPINE WITHOUT CONTRAST   TECHNIQUE: Multiplanar, multisequence MR imaging of the lumbar spine was performed. No intravenous contrast was administered.   COMPARISON:  Lumbar spine CT 06/21/2023.   FINDINGS: Segmentation: Conventional numbering is assumed with 5 non-rib-bearing, lumbar type vertebral bodies. Rudimentary disc at S1-2.   Alignment:  Normal.   Vertebrae: Modic type 2 degenerative endplate marrow signal changes at L3-4.   Conus medullaris and cauda equina: Conus extends to the L1-2 level. Conus and cauda equina appear normal.   Paraspinal and other soft tissues: Moderate fatty  atrophy of the paraspinal muscles.   Disc levels:   T11-12: Sagittal images only. Disc bulge and facet arthropathy results in moderate spinal canal stenosis and moderate bilateral neural foraminal narrowing.   T12-L1:  Moderate bilateral facet arthropathy.   L1-L2: Disc bulge and moderate bilateral facet arthropathy results in mild spinal canal stenosis, severe  left and moderate right neural foraminal narrowing.   L2-L3: Disc bulge and severe bilateral facet arthropathy results in moderate spinal canal stenosis and moderate bilateral neural foraminal narrowing.   L3-L4: Endplate osteophytes and facet arthropathy results in severe spinal canal stenosis and moderate bilateral neural foraminal narrowing.   L4-L5: No disc herniation or spinal canal stenosis. Severe bilateral facet arthropathy results in moderate bilateral neural foraminal narrowing.   L5-S1: No disc herniation or spinal canal stenosis. Facet arthropathy results in moderate bilateral neural foraminal narrowing.   IMPRESSION: 1. Multilevel lumbar spondylosis, worst at L3-4, where there is severe spinal canal stenosis and moderate bilateral neural foraminal narrowing. 2. Moderate spinal canal stenosis at T11-12 and L2-3. 3. Moderate to severe neural foraminal narrowing at multiple additional levels, as detailed above.     Electronically Signed   By: Orvan Falconer M.D.   On: 06/29/2023 16:12     CLINICAL DATA:  Spinal stenosis with neurologic changes.   EXAM: MRI THORACIC SPINE WITHOUT CONTRAST   TECHNIQUE: Multiplanar, multisequence MR imaging of the thoracic spine was performed. No intravenous contrast was administered.   COMPARISON:  CTA chest 06/19/2021   FINDINGS: Alignment: There is exaggerated thoracic kyphosis. There is trace anterolisthesis of C7 on T1 and trace retrolisthesis of T9 on T10.   Vertebrae: Vertebral body heights are preserved. Background marrow signal is normal. There is  no suspicious marrow signal abnormality or marrow edema.   Cord: There is cord compression at C3-C4 (incompletely evaluated), and T9-T10. There is no definite cord signal abnormality at T9-T10. Cord signal at C3-C4 is not evaluated.   Paraspinal and other soft tissues: A left renal cyst is noted requiring no specific imaging follow-up. The paraspinal soft tissues are unremarkable.   Disc levels:   The cervical spine is imaged only with the sagittal T1 count sequence. There is severe spinal canal stenosis with cord compression at C3-C4 due to a posterior disc osteophyte complex and facet arthropathy. There is no other evidence of high-grade spinal canal stenosis in the cervical spine.   There is multilevel disc desiccation and narrowing in the mid and lower thoracic spine, most advanced at T7-T8 and T8-T9. There are small disc protrusions at C7-T1 and C5-C6, and mild disc bulges at T6-T7 through T8-T9 without significant spinal canal stenosis.   There is overall mild facet arthropathy at T8-T9 and above resulting in up to mild-to-moderate bilateral neural foraminal stenosis at T8-T9.   At T9-T10, there is trace retrolisthesis with disc bulge and moderate facet arthropathy with ligamentum flavum thickening resulting in severe spinal canal stenosis with cord compression, and severe bilateral neural foraminal stenosis.   IMPRESSION: 1. The cervical spine is included on the sagittal T1 count sequence. There is severe spinal canal stenosis with cord compression at C3-C4 due to a posterior disc osteophyte complex and facet arthropathy. Cord signal abnormality can not be assessed at this level. Consider dedicated cervical spine MRI as indicated. 2. Severe spinal canal stenosis and severe bilateral neural foraminal stenosis at T9-T10 due to grade 1 retrolisthesis, a disc bulge, and moderate facet arthropathy without definite cord signal abnormality. 3. Overall mild degenerative  changes at the other levels without other high-grade spinal canal or neural foraminal stenosis.     Electronically Signed   By: Lesia Hausen M.D.   On: 07/05/2023 19:35 I have personally reviewed the images and agree with the above interpretation.   Notably, there is discordance in the reads between the MR thoracic and MRI  lumbar spine.  When compared to the CT scan lumbar spine and thoracic spine.  The area of spondylosis/severe compression appears to be at the last rib-bearing level and above.  Based off of the ribs this would be T11/T12.   Labs:    Latest Ref Rng & Units 07/08/2023    5:20 AM 07/06/2023    5:46 AM 07/03/2023    4:30 AM  CBC  WBC 4.0 - 10.5 K/uL 14.1  6.2  6.8   Hemoglobin 12.0 - 15.0 g/dL 16.1  09.6  04.5   Hematocrit 36.0 - 46.0 % 36.0  40.7  37.7   Platelets 150 - 400 K/uL 244  239  225        Assessment and Plan: Ms. Millikin is a pleasant 72 y.o. female with history of progressive weakness over the past 2 weeks in the right lower extremity specifically.  She states that she had to lay flat for procedure and felt severe pain in her back as well as bilateral lower extremity weakness.  Taken to the OR on 07/07/2023 for T10-11 transpedicular decompression and fusion.  On postoperative day 1 she is doing quite well with improved lower extremity strength.  She states that the pain in spasms in her lower extremities have improved.  -Okay to mobilize -PT OT, possible PMNR consultation -Okay to restart anticoagulation for prophylaxis tonight -Will continue her drains including the wound VAC and her surgical drain. -Will continue to follow -Patient and family updated  Lovenia Kim, MD/MSCR Dept. of Neurosurgery

## 2023-07-08 NOTE — Plan of Care (Signed)

## 2023-07-08 NOTE — Progress Notes (Signed)
Progress Note   Patient: PIERRE WEINSTOCK LKG:[id number] DOB: May 23, 1951 DOA: 06/29/2023     2 DOS: the patient was seen and examined on 07/08/2023   Brief hospital course:  SHAWNEEQUA VICTORIA is a 72 y.o. female with medical history significant of recurrent UTI, Parkinson's disease, hypertension, OSA on CPAP, atrial fibrillation on Warfarin, lymphedema, degenerative disc disease, breast cancer s/p chemo mastectomy and radiation, hypothyroidism, neuropathy, gout, who presents to the ED due to back pain.  Lumbar MRI showed severe spinal stenosis on L3-L4.  Patient also has significant right leg weakness. Consulted neurosurgery, MRI of the thoracic spine was also performed, showed severe stenosis in T11-T12 which was responsible for leg weakness.  Surgery 10/18.    Principal Problem:   Acute back pain Active Problems:   Acute cystitis   AF (paroxysmal atrial fibrillation) (HCC)   Hypertension   Obesity (BMI 30-39.9)   Apnea, sleep   Breast cancer (HCC)   Large granular lymphocytic leukemia (HCC)   Lymphedema   CVA (cerebral vascular accident) (HCC)   Hypothyroidism   Parkinsonism (HCC)   Thoracic spondylosis with myelopathy   Weakness of both lower extremities   Spinal stenosis, thoracic   Spinal stenosis of thoracic region   Assessment and Plan:   Acute back pain with severe lumbar stenosis. Severe thoracic spine stenosis. Patient has been treated with pain medicine, pain is better.  However, patient still has significant right leg weakness.  Consult from neurosurgery obtained, MRI of the thoracic spine was also obtained, showed severe stenosis in T 11 to T12, which he appeared to be the source of right leg weakness.  Patient had a spinal surgery performed on 10/18, postoperatively, patient doing well. Patient right leg weakness seem to be improving.  Will ask PT/OT to reevaluate patient, may be able to discharge tomorrow if no need for nursing home.  Acute cystitis Recently patient  has been treated with acute UTI.  Blood culture at this time was negative, no need of antibiotics.     AF (paroxysmal atrial fibrillation) (HCC) - Decrease home Metoprolol given bradycardia Warfarin discontinued due to scheduled surgery.  Received 2 FFP in the morning.  INR dropped down to 1.6.   Parkinsonism (HCC) - Continue home regimen   Hypothyroidism - Continue home regimen   Large granular lymphocytic leukemia (HCC) Follows with oncology, currently under surveillance only.  No evidence of osseous lesion seen on CT imaging.   Obstruct sleep apnea Morbid obesity with BMI 40.11. - CPAP at bedtime   Hypertension - Continue home regimen       Subjective:  Patient doing better postop, weakness of the right leg is better.  Physical Exam: Vitals:   07/07/23 1618 07/07/23 2035 07/08/23 0112 07/08/23 0403  BP: (!) 144/91 (!) 139/93 121/80 124/67  Pulse: 85 86 75 70  Resp: 19 18 18 18   Temp: 98.3 F (36.8 C) 97.6 F (36.4 C) 97.8 F (36.6 C) 98.4 F (36.9 C)  TempSrc:      SpO2: 99% 100%    Weight:      Height:       General exam: Appears calm and comfortable  Respiratory system: Clear to auscultation. Respiratory effort normal. Cardiovascular system: S1 & S2 heard, RRR. No JVD, murmurs, rubs, gallops or clicks. No pedal edema. Gastrointestinal system: Abdomen is nondistended, soft and nontender. No organomegaly or masses felt. Normal bowel sounds heard. Central nervous system: Alert and oriented. No focal neurological deficits. Extremities: Symmetric 5 x 5 power.  Skin: No rashes, lesions or ulcers Psychiatry: Judgement and insight appear normal. Mood & affect appropriate.    Data Reviewed:  Lab results reviewed.  Family Communication: Daughter updated at bedside.  Disposition: Status is: Inpatient Remains inpatient appropriate because: Severity of disease, postop day #1.     Time spent: 35 minutes  Author: Marrion Coy, MD 07/08/2023 12:26 PM  For  on call review [email or web address].

## 2023-07-08 NOTE — Evaluation (Addendum)
Physical Therapy Re- Evaluation Patient Details Name: Suzanne Dixon MRN: [id number] DOB: 02-Jun-1951 Today's Date: 07/08/2023  History of Present Illness  Suzanne Dixon is a 72 y.o. female with medical history significant of recurrent UTI, Parkinson's disease, hypertension, OSA on CPAP, atrial fibrillation on Warfarin, lymphedema, degenerative disc disease, breast cancer s/p chemo mastectomy and radiation, hypothyroidism, neuropathy, gout, who presents to the ED due to back pain.   Lumbar MRI showed severe spinal stenosis on L3-L4.  Patient also has significant right leg weakness.  Consulted neurosurgery, MRI of the thoracic spine was also performed, showed severe stenosis in T11-T12 which was responsible for leg weakness.  Surgery 10/18, Pt with Hemovac, foley and Wound vac intact.  Clinical Impression  Pt received in bed agreeable to PT re-eval.. Pt Hemovac and Wound vac in tact through out the session. Pt A and O x 4. Pt reported desire to use SNF services. Today pt assessment revealed weakness, poor endurance with excellent motivation to progress. Pt needed max + 2 for bed mobility and STS x mod to max of 1. Pt could weight shift in standing but unable to march on spot. Deferred for ambulation. Pt stood twice at the EOB x 20 secs with followed by severe fatigue. PT educated pt about back precautions provided in H/O. PT will continue in acute care and will benefit from continued skilled PT beyond Acute care.     If plan is discharge home, recommend the following: Two people to help with walking and/or transfers;Two people to help with bathing/dressing/bathroom;Assistance with cooking/housework;Assistance with feeding;Direct supervision/assist for medications management;Assist for transportation;Help with stairs or ramp for entrance   Can travel by private vehicle   No    Equipment Recommendations Rolling walker (2 wheels);BSC/3in1  Recommendations for Other Services       Functional Status  Assessment Patient has had a recent decline in their functional status and demonstrates the ability to make significant improvements in function in a reasonable and predictable amount of time.     Precautions / Restrictions Precautions Precautions: Fall;Back Restrictions Weight Bearing Restrictions: No      Mobility  Bed Mobility Overal bed mobility: Needs Assistance Bed Mobility: Supine to Sit, Sit to Supine Rolling: Max assist, +2 for physical assistance   Supine to sit: Max assist, +2 for physical assistance Sit to supine: Max assist, +2 for safety/equipment   General bed mobility comments: Pt fatigues easily.    Transfers Overall transfer level: Needs assistance Equipment used: Rolling walker (2 wheels) Transfers: Sit to/from Stand Sit to Stand: Mod assist           General transfer comment: Pt performed AWt shifting but uanble to march on spot.    Ambulation/Gait               General Gait Details: deferred  Stairs            Wheelchair Mobility     Tilt Bed    Modified Rankin (Stroke Patients Only)       Balance Overall balance assessment: History of Falls, Needs assistance Sitting-balance support: Bilateral upper extremity supported Sitting balance-Leahy Scale: Fair Sitting balance - Comments: posterior lean and slouched posture. Pt able to sit taller with cueing; pain at all time.   Standing balance support: Bilateral upper extremity supported   Standing balance comment: stood x 20 secs x 2 with CGA of 1  Pertinent Vitals/Pain Pain Assessment Pain Assessment: 0-10 Pain Score: 4  Pain Location: back Pain Intervention(s): Monitored during session    Home Living Family/patient expects to be discharged to:: Private residence Living Arrangements: Alone Available Help at Discharge: Family;Available PRN/intermittently;Personal care attendant Type of Home: House Home Access: Ramped entrance        Home Layout: One level Home Equipment: Agricultural consultant (2 wheels);Cane - single point;Wheelchair - manual;Shower seat      Prior Function Prior Level of Function : Needs assist             Mobility Comments: ambulates with RW or uses W/c ADLs Comments: Pt. has home health 4 hours x 3 days a week to do laundry, grocery shopping, cooking, cleaning, helps with dressing, bathing.     Extremity/Trunk Assessment   Upper Extremity Assessment Upper Extremity Assessment: Defer to OT evaluation    Lower Extremity Assessment Lower Extremity Assessment: Generalized weakness RLE Sensation: WNL       Communication   Communication Communication: No apparent difficulties Cueing Techniques: Verbal cues  Cognition Arousal: Alert Behavior During Therapy: WFL for tasks assessed/performed Overall Cognitive Status: Within Functional Limits for tasks assessed                                          General Comments General comments (skin integrity, edema, etc.): Wound dressing intact.    Exercises Other Exercises Other Exercises: pt too tired after standing and bed mobility.   Assessment/Plan    PT Assessment Patient needs continued PT services  PT Problem List Decreased strength;Decreased activity tolerance;Decreased balance;Decreased mobility;Decreased range of motion;Pain;Decreased safety awareness       PT Treatment Interventions Gait training;DME instruction;Functional mobility training;Therapeutic activities;Therapeutic exercise;Balance training;Patient/family education    PT Goals (Current goals can be found in the Care Plan section)  Acute Rehab PT Goals Patient Stated Goal: to get back to being as before." PT Goal Formulation: With patient Time For Goal Achievement: 07/22/23 Potential to Achieve Goals: Fair    Frequency 7X/week     Co-evaluation               AM-PAC PT "6 Clicks" Mobility  Outcome Measure Help needed turning from  your back to your side while in a flat bed without using bedrails?: A Lot Help needed moving from lying on your back to sitting on the side of a flat bed without using bedrails?: A Lot Help needed moving to and from a bed to a chair (including a wheelchair)?: Total Help needed standing up from a chair using your arms (e.g., wheelchair or bedside chair)?: A Lot Help needed to walk in hospital room?: Total Help needed climbing 3-5 steps with a railing? : Total 6 Click Score: 9    End of Session Equipment Utilized During Treatment: Gait belt Activity Tolerance: No increased pain;Patient limited by fatigue Patient left: in bed;with bed alarm set;with call bell/phone within reach;with family/visitor present Nurse Communication: Mobility status PT Visit Diagnosis: Unsteadiness on feet (R26.81);Muscle weakness (generalized) (M62.81);History of falling (Z91.81);Dizziness and giddiness (R42);Pain;Difficulty in walking, not elsewhere classified (R26.2)    Time: 4401-0272 PT Time Calculation (min) (ACUTE ONLY): 25 min   Charges:   PT Re- Evaluation  PT Treatments $Therapeutic Activity: 8-22 mins PT General Charges $$ ACUTE PT VISIT: 1 Visit       Bush Murdoch PT DPT 3:00 PM,07/08/23

## 2023-07-08 NOTE — Progress Notes (Signed)
Resident s/p cervical laminectomy. She is alert and arouseable. C/o pain which was managed by prn pain. Wound draining sanguinous output. Suction to wound vac patent with no issues. She tolerated turning and repositioning with no issues.

## 2023-07-09 DIAGNOSIS — M4714 Other spondylosis with myelopathy, thoracic region: Secondary | ICD-10-CM | POA: Diagnosis not present

## 2023-07-09 DIAGNOSIS — I10 Essential (primary) hypertension: Secondary | ICD-10-CM | POA: Diagnosis not present

## 2023-07-09 DIAGNOSIS — I480 Paroxysmal atrial fibrillation: Secondary | ICD-10-CM | POA: Diagnosis not present

## 2023-07-09 LAB — BASIC METABOLIC PANEL
Anion gap: 9 (ref 5–15)
BUN: 44 mg/dL — ABNORMAL HIGH (ref 8–23)
CO2: 29 mmol/L (ref 22–32)
Calcium: 8.2 mg/dL — ABNORMAL LOW (ref 8.9–10.3)
Chloride: 97 mmol/L — ABNORMAL LOW (ref 98–111)
Creatinine, Ser: 0.86 mg/dL (ref 0.44–1.00)
GFR, Estimated: >60 mL/min (ref >60)
Glucose, Bld: 84 mg/dL (ref 70–99)
Potassium: 4.3 mmol/L (ref 3.5–5.1)
Sodium: 135 mmol/L (ref 135–145)

## 2023-07-09 MED ORDER — ENOXAPARIN SODIUM 40 MG/0.4ML IJ SOSY
40.0000 mg | PREFILLED_SYRINGE | INTRAMUSCULAR | Status: DC
  Administered 2023-07-09 – 2023-07-13 (×5): 40 mg via SUBCUTANEOUS
  Filled 2023-07-09 (×5): qty 0.4

## 2023-07-09 MED ORDER — SENNOSIDES-DOCUSATE SODIUM 8.6-50 MG PO TABS
2.0000 | ORAL_TABLET | Freq: Two times a day (BID) | ORAL | Status: DC
  Administered 2023-07-09 – 2023-07-11 (×5): 2 via ORAL
  Filled 2023-07-09 (×7): qty 2

## 2023-07-09 MED ORDER — LACTULOSE 10 GM/15ML PO SOLN
20.0000 g | Freq: Once | ORAL | Status: AC
  Administered 2023-07-09: 20 g via ORAL
  Filled 2023-07-09: qty 30

## 2023-07-09 MED ORDER — IPRATROPIUM-ALBUTEROL 0.5-2.5 (3) MG/3ML IN SOLN: 3.0000 mL | Freq: Four times a day (QID) | RESPIRATORY_TRACT | Status: DC | PRN

## 2023-07-09 NOTE — Progress Notes (Signed)
Physical Therapy Treatment Patient Details Name: Suzanne Dixon MRN: [id number] DOB: [birth year] Today's Date: 07/09/2023   History of Present Illness Pt is a 72 year old female presenting to the ED due to back pain, now s/p T10-11 transpedicular decompression and fusion for severe spinal stenosis with weakness 07/07/23    PMH significant for recurrent UTI, Parkinson's disease, hypertension, OSA on CPAP, atrial fibrillation on Warfarin, lymphedema, degenerative disc disease, breast cancer s/p chemo mastectomy and radiation, hypothyroidism, neuropathy, gout    PT Comments  Pt ready for co-tx session with OT for improved outcomes.  Pt remains heavy +2 assist for all bed mobility.  Overall sitting EOB with cga x 1 and occasional cues to sit upright.  She does stand x 2 EOB with mod/max a x 2 from elevated but and overall standing is improved from last Sunday.  She is unable to take any functional steps to allow for gait or transfers.  SNF remains appropriate for discharge.  She remains motivated to increase independence.    If plan is discharge home, recommend the following: Two people to help with walking and/or transfers;Two people to help with bathing/dressing/bathroom;Assistance with cooking/housework;Assistance with feeding;Direct supervision/assist for medications management;Assist for transportation;Help with stairs or ramp for entrance   Can travel by private vehicle     No  Equipment Recommendations  Rolling walker (2 wheels);BSC/3in1    Recommendations for Other Services       Precautions / Restrictions Precautions Precautions: Fall;Back Precaution Comments: no brace needed Restrictions Weight Bearing Restrictions: No     Mobility  Bed Mobility Overal bed mobility: Needs Assistance Bed Mobility: Rolling, Sidelying to Sit, Sit to Sidelying Rolling: Max assist, +2 for physical assistance Sidelying to sit: Max assist, +2 for safety/equipment   Sit to supine: Max assist, +2 for  safety/equipment   General bed mobility comments: modified log roll with pt with fair adherence to precautions, will continue to assess Patient Response: Cooperative  Transfers Overall transfer level: Needs assistance Equipment used: Rolling walker (2 wheels) Transfers: Sit to/from Stand Sit to Stand: Mod assist, Max assist, +2 physical assistance, +2 safety/equipment           General transfer comment: two lateral steps up the bed with MOD-MAX A +2 with RW    Ambulation/Gait               General Gait Details: steps limited to 2 lateral sidesteps along EOB.  unsafe to move away from bed or attempt transfer   Stairs             Wheelchair Mobility     Tilt Bed Tilt Bed Patient Response: Cooperative  Modified Rankin (Stroke Patients Only)       Balance Overall balance assessment: History of Falls, Needs assistance Sitting-balance support: Bilateral upper extremity supported Sitting balance-Leahy Scale: Fair     Standing balance support: Bilateral upper extremity supported Standing balance-Leahy Scale: Poor                              Cognition Arousal: Alert Behavior During Therapy: WFL for tasks assessed/performed Overall Cognitive Status: Within Functional Limits for tasks assessed                                          Exercises      General Comments General comments (  skin integrity, edema, etc.): drain, wound vac, dressing intact pre/post session      Pertinent Vitals/Pain Pain Assessment Pain Assessment: Faces Faces Pain Scale: Hurts little more Pain Location: back Pain Descriptors / Indicators: Discomfort, Grimacing Pain Intervention(s): Limited activity within patient's tolerance, Monitored during session, Repositioned    Home Living Family/patient expects to be discharged to:: Private residence Living Arrangements: Alone Available Help at Discharge: Family;Available PRN/intermittently;Personal  care attendant Type of Home: House Home Access: Ramped entrance       Home Layout: One level Home Equipment: Agricultural consultant (2 wheels);Cane - single point;Wheelchair - manual;Shower seat      Prior Function            PT Goals (current goals can now be found in the care plan section) Progress towards PT goals: Progressing toward goals    Frequency    7X/week      PT Plan      Co-evaluation PT/OT/SLP Co-Evaluation/Treatment: Yes Reason for Co-Treatment: To address functional/ADL transfers;Complexity of the patient's impairments (multi-system involvement) PT goals addressed during session: Mobility/safety with mobility OT goals addressed during session: ADL's and self-care;Proper use of Adaptive equipment and DME      AM-PAC PT "6 Clicks" Mobility   Outcome Measure  Help needed turning from your back to your side while in a flat bed without using bedrails?: A Lot Help needed moving from lying on your back to sitting on the side of a flat bed without using bedrails?: A Lot Help needed moving to and from a bed to a chair (including a wheelchair)?: Total Help needed standing up from a chair using your arms (e.g., wheelchair or bedside chair)?: A Lot Help needed to walk in hospital room?: Total Help needed climbing 3-5 steps with a railing? : Total 6 Click Score: 9    End of Session Equipment Utilized During Treatment: Gait belt Activity Tolerance: No increased pain;Patient limited by fatigue Patient left: in bed;with bed alarm set;with call bell/phone within reach Nurse Communication: Mobility status PT Visit Diagnosis: Unsteadiness on feet (R26.81);Muscle weakness (generalized) (M62.81);History of falling (Z91.81);Dizziness and giddiness (R42);Pain;Difficulty in walking, not elsewhere classified (R26.2)     Time: 6962-9528 PT Time Calculation (min) (ACUTE ONLY): 19 min  Charges:    $Therapeutic Activity: 8-22 mins PT General Charges $$ ACUTE PT VISIT: 1  Visit                   Danielle Dess, PTA 07/09/23, 11:27 AM

## 2023-07-09 NOTE — Progress Notes (Signed)
Progress Note   Patient: Suzanne Dixon OZH:[id number] DOB: December 31, 1950 DOA: 06/29/2023     3 DOS: the patient was seen and examined on 07/09/2023   Brief hospital course:  Suzanne Dixon is a 72 y.o. female with medical history significant of recurrent UTI, Parkinson's disease, hypertension, OSA on CPAP, atrial fibrillation on Warfarin, lymphedema, degenerative disc disease, breast cancer s/p chemo mastectomy and radiation, hypothyroidism, neuropathy, gout, who presents to the ED due to back pain.  Lumbar MRI showed severe spinal stenosis on L3-L4.  Patient also has significant right leg weakness. Consulted neurosurgery, MRI of the thoracic spine was also performed, showed severe stenosis in T11-T12 which was responsible for leg weakness.  Surgery 10/18.    Principal Problem:   Acute back pain Active Problems:   Acute cystitis   AF (paroxysmal atrial fibrillation) (HCC)   Hypertension   Obesity (BMI 30-39.9)   Apnea, sleep   Breast cancer (HCC)   Large granular lymphocytic leukemia (HCC)   Lymphedema   CVA (cerebral vascular accident) (HCC)   Hypothyroidism   Parkinsonism (HCC)   Thoracic spondylosis with myelopathy   Weakness of both lower extremities   Spinal stenosis, thoracic   Spinal stenosis of thoracic region   Assessment and Plan: Acute back pain with severe lumbar stenosis. Severe thoracic spine stenosis. Patient has been treated with pain medicine, pain is better.  However, patient still has significant right leg weakness.  Consult from neurosurgery obtained, MRI of the thoracic spine was also obtained, showed severe stenosis in T 11 to T12, which he appeared to be the source of right leg weakness.  Patient had a spinal surgery performed on 10/18, postoperatively, patient doing well.  Patient right leg weakness much improved postop.  Still has a drain at the surgical site.  Currently pending nursing home placement.   Acute cystitis Recently patient has been treated  with acute UTI.  Blood culture at this time was negative, no need of antibiotics.     AF (paroxysmal atrial fibrillation) (HCC) - Decrease home Metoprolol given bradycardia Warfarin discontinued due to scheduled surgery.  Received 2 FFP in the morning.  INR dropped down to 1.6. Due to large surgery, neurosurgery wanted off warfarin, but okay to start prophylaxis.   Parkinsonism (HCC) - Continue home regimen   Hypothyroidism - Continue home regimen   Large granular lymphocytic leukemia (HCC) Follows with oncology, currently under surveillance only.  No evidence of osseous lesion seen on CT imaging.   Obstruct sleep apnea Morbid obesity with BMI 40.11. - CPAP at bedtime   Hypertension - Continue home regimen      Subjective:  Patient doing well, still has some generalized weakness.  Physical Exam: Vitals:   07/08/23 1725 07/08/23 2056 07/09/23 0028 07/09/23 0559  BP: 101/66 97/64 105/66 (!) 90/56  Pulse: 82 84 77 73  Resp: 20 18 18 18   Temp: 97.8 F (36.6 C) (!) 97.5 F (36.4 C) 98.8 F (37.1 C) 98.7 F (37.1 C)  TempSrc:  Oral    SpO2: 100% 98% 100% 95%  Weight:      Height:       General exam: Appears calm and comfortable  Respiratory system: Clear to auscultation. Respiratory effort normal. Cardiovascular system: S1 & S2 heard, RRR. No JVD, murmurs, rubs, gallops or clicks. No pedal edema. Gastrointestinal system: Abdomen is nondistended, soft and nontender. No organomegaly or masses felt. Normal bowel sounds heard. Central nervous system: Alert and oriented. No focal neurological deficits. Extremities: Symmetric  5 x 5 power. Skin: No rashes, lesions or ulcers Psychiatry: Judgement and insight appear normal. Mood & affect appropriate.    Data Reviewed:  Lab results reviewed.  Family Communication: None  Disposition: Status is: Inpatient Remains inpatient appropriate because: Severity of disease, postop day #2.  Pending nursing home  placement.     Time spent: 35 minutes  Author: Marrion Coy, MD 07/09/2023 12:10 PM  For on call review [email or web address].

## 2023-07-09 NOTE — TOC Progression Note (Addendum)
Transition of Care [facility]) - Progression Note    Patient Details  Name: Suzanne Dixon MRN: [id number] Date of Birth: [birth year]  [facility] of [facility]) CM/SW Contact  Liliana Cline, LCSW Phone Number: 07/09/2023, 2:33 PM  Clinical Narrative:    CSW met with patient at bedside. Explained that PT is still recommending SNF. Patient states she is agreeable to SNF. Explained current bed offer at [facility] and [facility] is pending. Patient states she prefers [facility] over [facility]. CSW asked Tammy at [facility] to review the referral. Tammy states she will review the referral.         Expected Discharge Plan and Services                                               Social Determinants of Health (SDOH) Interventions SDOH Screenings   Food Insecurity: Food Insecurity Present (06/29/2023)  Housing: Low Risk  (06/29/2023)  Transportation Needs: No Transportation Needs (06/29/2023)  Recent Concern: Transportation Needs - Unmet Transportation Needs (05/02/2023)   Received from [facility]  Utilities: Not At Risk (06/29/2023)  Financial Resource Strain: Low Risk  (06/09/2023)   Received from [facility]  Recent Concern: Financial Resource Strain - High Risk (05/02/2023)   Received from [facility]  Physical Activity: Insufficiently Active (05/02/2023)   Received from [facility]  Social Connections: Moderately Isolated (05/02/2023)   Received from [facility]  Stress: Stress Concern Present (05/02/2023)   Received from [facility]  Tobacco Use: Low Risk  (06/29/2023)  Health Literacy: Inadequate Health Literacy (05/02/2023)   Received from [facility]    Readmission Risk Interventions    06/21/2021    4:12 PM  Readmission Risk Prevention Plan  Transportation Screening Complete  PCP or Specialist Appt within 3-5 Days Complete  HRI or Home Care Consult  Complete  Social Work Consult for Recovery Care Planning/Counseling Complete  Palliative Care Screening Not Applicable  Medication Review Oceanographer) Complete

## 2023-07-09 NOTE — Evaluation (Addendum)
Occupational Therapy Re-Evaluation Patient Details Name: Suzanne Dixon MRN: [id number] DOB: [birth year] Today's Date: 07/09/2023   History of Present Illness Pt is a 72 year old female presenting to the ED due to back pain, now s/p T10-11 transpedicular decompression and fusion for severe spinal stenosis with weakness 07/07/23    PMH significant for recurrent UTI, Parkinson's disease, hypertension, OSA on CPAP, atrial fibrillation on Warfarin, lymphedema, degenerative disc disease, breast cancer s/p chemo mastectomy and radiation, hypothyroidism, neuropathy, gout   Clinical Impression    Chart reviewed to date, re-evaluation completed s/p surgery, also co tx with PT. Pt reports she feels RLE function on this date. Goals reviewed and updated. Discharge recommendation remains appropriate Pt continues to present with deficits in strength, endurance, activity tolerance, balance affecting safe and optimal ADL completion. Bed mobility with modified log roll required MAX A+2, seated on edge of bed with supervision, progressing to need for CGA due to fatigue. STS with MOD-MAX A +2 two attempts, two lateral steps up the bed with MAX A +2 with RW. Additional +1 for lines/leads. OT will continue to follow acutely.     If plan is discharge home, recommend the following: A lot of help with bathing/dressing/bathroom;Assistance with cooking/housework;Help with stairs or ramp for entrance;Two people to help with walking and/or transfers    Functional Status Assessment  Patient has had a recent decline in their functional status and demonstrates the ability to make significant improvements in function in a reasonable and predictable amount of time.  Equipment Recommendations  Other (comment) (defer to next venue of care)    Recommendations for Other Services       Precautions / Restrictions Precautions Precautions: Fall;Back Precaution Comments: no brace needed Restrictions Weight Bearing Restrictions: No       Mobility Bed Mobility Overal bed mobility: Needs Assistance Bed Mobility: Rolling, Sidelying to Sit, Sit to Sidelying Rolling: Max assist, +2 for physical assistance Sidelying to sit: Max assist, +2 for safety/equipment     Sit to sidelying: Max assist, +2 for safety/equipment General bed mobility comments: modified log roll with pt with fair adherence to precautions, will continue to assess    Transfers Overall transfer level: Needs assistance Equipment used: Rolling walker (2 wheels) Transfers: Sit to/from Stand Sit to Stand: Mod assist, Max assist, +2 physical assistance, +2 safety/equipment (two attempts, improved technique with second attempt)           General transfer comment: two lateral steps up the bed with MOD-MAX A +2 with RW, frequent multi modal cues for technique       Balance Overall balance assessment: History of Falls, Needs assistance Sitting-balance support: Bilateral upper extremity supported Sitting balance-Leahy Scale: Fair   Postural control: Posterior lean Standing balance support: Bilateral upper extremity supported Standing balance-Leahy Scale: Poor                             ADL either performed or assessed with clinical judgement   ADL Overall ADL's : Needs assistance/impaired Eating/Feeding: Supervision/ safety;Sitting   Grooming: Supervision/safety;Sitting;Wash/dry face Grooming Details (indicate cue type and reason): at edge of bed     Lower Body Bathing: Total assistance       Lower Body Dressing: Maximal assistance;Sitting/lateral leans                       Vision Baseline Vision/History: 1 Wears glasses Patient Visual Report: No change from baseline  Perception         Praxis         Pertinent Vitals/Pain Pain Assessment Pain Assessment: Faces Faces Pain Scale: Hurts a little bit Pain Location: back Pain Descriptors / Indicators: Discomfort, Grimacing Pain Intervention(s):  Limited activity within patient's tolerance, Monitored during session, Repositioned     Extremity/Trunk Assessment Upper Extremity Assessment Upper Extremity Assessment: Generalized weakness (globally weak throughout)   Lower Extremity Assessment Lower Extremity Assessment: Generalized weakness RLE Deficits / Details: LLE>RLE in terms of strength RLE Sensation: WNL   Cervical / Trunk Assessment Cervical / Trunk Assessment: Back Surgery   Communication Communication Communication: No apparent difficulties Cueing Techniques: Verbal cues   Cognition Arousal: Alert Behavior During Therapy: WFL for tasks assessed/performed Overall Cognitive Status: Impaired/Different from baseline Area of Impairment: Orientation, Problem solving                 Orientation Level: Disoriented to, Time           Problem Solving: Slow processing       General Comments  drain, wound vac, dressing intact pre/post session    Exercises Other Exercises Other Exercises: edu re: role of OT, role of rehab, discharge recommendations, precautions   Shoulder Instructions      Home Living Family/patient expects to be discharged to:: Private residence Living Arrangements: Alone Available Help at Discharge: Family;Available PRN/intermittently;Personal care attendant Type of Home: House Home Access: Ramped entrance     Home Layout: One level     Bathroom Shower/Tub: Producer, television/film/video: Handicapped height     Home Equipment: Agricultural consultant (2 wheels);Cane - single point;Wheelchair - manual;Shower seat          Prior Functioning/Environment Prior Level of Function : Needs assist             Mobility Comments: ambulates with RW or uses W/c ADLs Comments: Pt. has home health 4 hours x 3 days a week to do laundry, grocery shopping, cooking, cleaning, helps with dressing, bathing.        OT Problem List: Decreased strength;Decreased coordination;Pain;Impaired  balance (sitting and/or standing);Decreased safety awareness      OT Treatment/Interventions: Self-care/ADL training;Therapeutic activities;Energy conservation;DME and/or AE instruction;Balance training    OT Goals(Current goals can be found in the care plan section) Acute Rehab OT Goals Patient Stated Goal: rehab OT Goal Formulation: With patient Time For Goal Achievement: 07/23/23 Potential to Achieve Goals: Good ADL Goals Pt Will Perform Grooming: sitting;standing;with modified independence Pt Will Transfer to Toilet: with contact guard assist;bedside commode;stand pivot transfer Pt Will Perform Toileting - Clothing Manipulation and hygiene: with contact guard assist;sitting/lateral leans  OT Frequency: Min 1X/week    Co-evaluation PT/OT/SLP Co-Evaluation/Treatment: Yes Reason for Co-Treatment: To address functional/ADL transfers;Complexity of the patient's impairments (multi-system involvement)   OT goals addressed during session: ADL's and self-care;Proper use of Adaptive equipment and DME      AM-PAC OT "6 Clicks" Daily Activity     Outcome Measure Help from another person eating meals?: None Help from another person taking care of personal grooming?: A Little Help from another person toileting, which includes using toliet, bedpan, or urinal?: Total Help from another person bathing (including washing, rinsing, drying)?: A Lot Help from another person to put on and taking off regular upper body clothing?: A Lot Help from another person to put on and taking off regular lower body clothing?: A Lot 6 Click Score: 14   End of Session Equipment Utilized During Treatment:  Rolling walker (2 wheels) Nurse Communication: Mobility status  Activity Tolerance: Patient tolerated treatment well Patient left: in bed;with call bell/phone within reach;with bed alarm set  OT Visit Diagnosis: Unsteadiness on feet (R26.81);Other abnormalities of gait and mobility (R26.89);Repeated falls  (R29.6);Muscle weakness (generalized) (M62.81)                Time: 1610-9604 OT Time Calculation (min): 30 min Charges:  OT General Charges $OT Visit: 1 Visit OT Evaluation $OT Re-eval: 1 Re-eval OT Treatments $Therapeutic Activity: 8-22 mins  Oleta Mouse, OTD OTR/L  07/09/23, 10:34 AM

## 2023-07-10 DIAGNOSIS — E669 Obesity, unspecified: Secondary | ICD-10-CM

## 2023-07-10 DIAGNOSIS — I480 Paroxysmal atrial fibrillation: Secondary | ICD-10-CM | POA: Diagnosis not present

## 2023-07-10 DIAGNOSIS — M4714 Other spondylosis with myelopathy, thoracic region: Secondary | ICD-10-CM | POA: Diagnosis not present

## 2023-07-10 MED ORDER — LACTULOSE 10 GM/15ML PO SOLN
20.0000 g | Freq: Once | ORAL | Status: AC
  Administered 2023-07-10: 20 g via ORAL
  Filled 2023-07-10: qty 30

## 2023-07-10 NOTE — Progress Notes (Signed)
Occupational Therapy Treatment Patient Details Name: Suzanne Dixon MRN: [id number] DOB: 1950-10-14 Today's Date: 07/10/2023   History of present illness Pt is a 72 year old female presenting to the ED due to back pain, now s/p T10-11 transpedicular decompression and fusion for severe spinal stenosis with weakness 07/07/23    PMH significant for recurrent UTI, Parkinson's disease, hypertension, OSA on CPAP, atrial fibrillation on Warfarin, lymphedema, degenerative disc disease, breast cancer s/p chemo mastectomy and radiation, hypothyroidism, neuropathy, gout   OT comments  Pt received semi-reclined in bed. Appearing slightly lethargic; daughter in room; willing to work with OT on ADLs at EOB and standing. T/f MOD-MAX x2. See flowsheet below for further details of session. Left semi-reclined in bed, bed alarm on, with all needs in reach.  Patient will benefit from continued OT while in acute care.       If plan is discharge home, recommend the following:  A lot of help with bathing/dressing/bathroom;Assistance with cooking/housework;Help with stairs or ramp for entrance;Two people to help with walking and/or transfers   Equipment Recommendations  Other (comment) (defer)    Recommendations for Other Services      Precautions / Restrictions Precautions Precautions: Fall;Back Restrictions Weight Bearing Restrictions: No       Mobility Bed Mobility Overal bed mobility: Needs Assistance Bed Mobility: Sit to Sidelying, Supine to Sit     Supine to sit: Mod assist, Max assist, +2 for physical assistance Sit to supine: Mod assist, +2 for physical assistance   General bed mobility comments: HOB elevated for t/f out of bed; cues for each step; x2 physical assist    Transfers Overall transfer level: Needs assistance Equipment used: Rolling walker (2 wheels) Transfers: Sit to/from Stand Sit to Stand: Mod assist, Max assist, +2 physical assistance, +2 safety/equipment            General transfer comment: two lateral steps to the L; poor posture in standing.     Balance Overall balance assessment: History of Falls, Needs assistance Sitting-balance support: Bilateral upper extremity supported Sitting balance-Leahy Scale: Fair Sitting balance - Comments: posterior lean and slouched posture. Postural control: Posterior lean Standing balance support: Bilateral upper extremity supported Standing balance-Leahy Scale: Poor Standing balance comment: stood 3x during session; unable to tolerate more than a few seconds of standing.                           ADL either performed or assessed with clinical judgement   ADL Overall ADL's : Needs assistance/impaired     Grooming: Sitting;Supervision/safety;Set up;Wash/dry face;Oral care                               Functional mobility during ADLs: +2 for physical assistance;Maximal assistance;Rolling walker (2 wheels) General ADL Comments: Pt requiring MIN A-SBA for sitting balance; posterior lean.    Extremity/Trunk Assessment Upper Extremity Assessment Upper Extremity Assessment: Generalized weakness   Lower Extremity Assessment Lower Extremity Assessment: Generalized weakness        Vision       Perception     Praxis      Cognition Arousal: Alert Behavior During Therapy: WFL for tasks assessed/performed Overall Cognitive Status: Within Functional Limits for tasks assessed  General Comments: Pt appears to be mildly lethargic and "loopy"; remains appropriate during session and follows commands; feeling tired. Forgot that her daughter was in the room until she saw her; pt stating to herself "I'm still in my hospital room".        Exercises      Shoulder Instructions       General Comments On room air; daughter in room throughout session    Pertinent Vitals/ Pain       Pain Assessment Pain Assessment: 0-10 Pain Score: 5  Pain  Location: back Pain Descriptors / Indicators: Discomfort, Grimacing Pain Intervention(s): Limited activity within patient's tolerance, Monitored during session, Repositioned  Home Living                                          Prior Functioning/Environment              Frequency  Min 1X/week        Progress Toward Goals  OT Goals(current goals can now be found in the care plan section)  Progress towards OT goals: Progressing toward goals  Acute Rehab OT Goals Patient Stated Goal: Walk again OT Goal Formulation: With patient Time For Goal Achievement: 07/23/23 Potential to Achieve Goals: Good ADL Goals Pt Will Perform Grooming: sitting;standing;with modified independence Pt Will Perform Lower Body Dressing: with modified independence;sitting/lateral leans Pt Will Transfer to Toilet: with contact guard assist;bedside commode;stand pivot transfer Pt Will Perform Toileting - Clothing Manipulation and hygiene: with contact guard assist;sitting/lateral leans  Plan      Co-evaluation    PT/OT/SLP Co-Evaluation/Treatment: Yes Reason for Co-Treatment: To address functional/ADL transfers;Complexity of the patient's impairments (multi-system involvement)   OT goals addressed during session: ADL's and self-care;Proper use of Adaptive equipment and DME      AM-PAC OT "6 Clicks" Daily Activity     Outcome Measure   Help from another person eating meals?: None Help from another person taking care of personal grooming?: A Little Help from another person toileting, which includes using toliet, bedpan, or urinal?: Total Help from another person bathing (including washing, rinsing, drying)?: A Lot Help from another person to put on and taking off regular upper body clothing?: A Lot Help from another person to put on and taking off regular lower body clothing?: Total 6 Click Score: 13    End of Session Equipment Utilized During Treatment: Gait belt;Rolling  walker (2 wheels)  OT Visit Diagnosis: Unsteadiness on feet (R26.81);Other abnormalities of gait and mobility (R26.89);Repeated falls (R29.6);Muscle weakness (generalized) (M62.81)   Activity Tolerance Patient tolerated treatment well   Patient Left in bed;with call bell/phone within reach;with bed alarm set;with family/visitor present   Nurse Communication Mobility status        Time: 8657-8469 OT Time Calculation (min): 23 min  Charges: OT General Charges $OT Visit: 1 Visit OT Treatments $Self Care/Home Management : 8-22 mins  Linward Foster, MS, OTR/L   Alvester Morin 07/10/2023, 12:35 PM

## 2023-07-10 NOTE — Plan of Care (Signed)

## 2023-07-10 NOTE — Progress Notes (Signed)
Progress Note   Patient: Suzanne Dixon [id number]:[id number] DOB: Nov 17, 1950 DOA: 06/29/2023     4 DOS: the patient was seen and examined on 07/10/2023   Brief hospital course:  NAYVEE GINDER is a 72 y.o. female with medical history significant of recurrent UTI, Parkinson's disease, hypertension, OSA on CPAP, atrial fibrillation on Warfarin, lymphedema, degenerative disc disease, breast cancer s/p chemo mastectomy and radiation, hypothyroidism, neuropathy, gout, who presents to the ED due to back pain.  Lumbar MRI showed severe spinal stenosis on L3-L4.  Patient also has significant right leg weakness. Consulted neurosurgery, MRI of the thoracic spine was also performed, showed severe stenosis in T11-T12 which was responsible for leg weakness.  Surgery 10/18.    Principal Problem:   Acute back pain Active Problems:   Acute cystitis   AF (paroxysmal atrial fibrillation) (HCC)   Hypertension   Obesity (BMI 30-39.9)   Apnea, sleep   Breast cancer (HCC)   Large granular lymphocytic leukemia (HCC)   Lymphedema   CVA (cerebral vascular accident) (HCC)   Hypothyroidism   Parkinsonism (HCC)   Thoracic spondylosis with myelopathy   Weakness of both lower extremities   Spinal stenosis, thoracic   Spinal stenosis of thoracic region   Assessment and Plan: Acute back pain with severe lumbar stenosis. Severe thoracic spine stenosis the right leg weakness. Patient has been treated with pain medicine, pain is better.  However, patient still has significant right leg weakness.  Consult from neurosurgery obtained, MRI of the thoracic spine was also obtained, showed severe stenosis in T 11 to T12, which he appeared to be the source of right leg weakness.  Patient had a spinal surgery performed on 10/18, postoperatively, patient doing well.   Patient doing well today, continue PT/OT, pending nursing home placement. Foley catheter removed, voiding trial.   Acute cystitis Recently patient has been  treated with acute UTI.  Blood culture at this time was negative, no need of antibiotics.     AF (paroxysmal atrial fibrillation) (HCC) - Decrease home Metoprolol given bradycardia Warfarin discontinued due to scheduled surgery.  Received 2 FFP in the morning.  INR dropped down to 1.6. Due to large surgery, neurosurgery wanted off warfarin, but okay to start prophylaxis.   Parkinsonism (HCC) - Continue home regimen   Hypothyroidism - Continue home regimen   Large granular lymphocytic leukemia (HCC) Follows with oncology, currently under surveillance only.  No evidence of osseous lesion seen on CT imaging.   Obstruct sleep apnea Morbid obesity with BMI 40.11. - CPAP at bedtime   Hypertension - Continue home regimen   Stage II pressure ulcer of the right buttocks. POA Follow-up with RN.         Subjective:  Patient feel constipated, no other complaint, pain under control.  Physical Exam: Vitals:   07/09/23 2018 07/09/23 2107 07/10/23 0536 07/10/23 0753  BP: 99/64 101/63 105/73 126/72  Pulse: 82 78 77 73  Resp: 17  18 16   Temp: (!) 97.5 F (36.4 C)  (!) 97.3 F (36.3 C) 97.8 F (36.6 C)  TempSrc: Oral   Oral  SpO2: 99%  100% 92%  Weight:      Height:       General exam: Appears calm and comfortable  Respiratory system: Clear to auscultation. Respiratory effort normal. Cardiovascular system: S1 & S2 heard, RRR. No JVD, murmurs, rubs, gallops or clicks. No pedal edema. Gastrointestinal system: Abdomen is nondistended, soft and nontender. No organomegaly or masses felt. Normal bowel sounds  heard. Central nervous system: Alert and oriented. No focal neurological deficits. Extremities: Symmetric 5 x 5 power. Skin: No rashes, lesions or ulcers Psychiatry: Judgement and insight appear normal. Mood & affect appropriate.    Data Reviewed:  Lab results reviewed.  Family Communication: Daughter updated at bedside.  Disposition: Status is: Inpatient Remains  inpatient appropriate because: Severity disease, unsafe discharge.     Time spent: 35 minutes  Author: Marrion Coy, MD 07/10/2023 11:59 AM  For on call review [email or web address].

## 2023-07-10 NOTE — Progress Notes (Addendum)
   Neurosurgery Progress Note  History: Suzanne Dixon is s/p T10-11 transpedicular decompression and fusion on 10/18  POD3: improved lower extremity strength this morning. Continues to have some low back and buttock pain   Physical Exam: Vitals:   07/09/23 2107 07/10/23 0536  BP: 101/63 105/73  Pulse: 78 77  Resp:  18  Temp:  (!) 97.3 F (36.3 C)  SpO2:  100%    AA Ox3 CNI   Strength:  5/5 throughout BUE  Side Iliopsoas Quads Hamstring PF DF EHL  R 4+ 4- 4- 4+ 4+ 4-  L 4+ 4+ 4 4+ 4+ 4+    HV 80 over the last 24 hours  Data:  Other tests/results: NA  Assessment/Plan:  Suzanne Dixon is a 72 y.o presenting with progressive lower extremity weakness and radiating leg pain s/p thoracic decompression and fusion.   - mobilize - pain control - ok for DVT prophylaxis from neurosurgical standpoint - PTOT - will remove HV today - continue wound vac. Will remove on POD5 or day of discharge   Manning Charity PA-C Department of Neurosurgery

## 2023-07-10 NOTE — Progress Notes (Signed)
Physical Therapy Treatment Patient Details Name: Suzanne Dixon MRN: [id number] DOB: 02/16/1951 Today's Date: 07/10/2023   History of Present Illness Pt is a 72 year old female presenting to the ED due to back pain, now s/p T10-11 transpedicular decompression and fusion for severe spinal stenosis with weakness 07/07/23    PMH significant for recurrent UTI, Parkinson's disease, hypertension, OSA on CPAP, atrial fibrillation on Warfarin, lymphedema, degenerative disc disease, breast cancer s/p chemo mastectomy and radiation, hypothyroidism, neuropathy, gout    PT Comments  Co-tx with OT for improved outcomes and pt/staff safety.  She continues to require mod/max x 2 for all mobility.  Stood x 3 today and remains unable to take any functional steps but does put in good effort despite some increased sleepiness.  Daughter in room for session.      If plan is discharge home, recommend the following: Two people to help with walking and/or transfers;Two people to help with bathing/dressing/bathroom;Assistance with cooking/housework;Assistance with feeding;Direct supervision/assist for medications management;Assist for transportation;Help with stairs or ramp for entrance   Can travel by private vehicle        Equipment Recommendations  Rolling walker (2 wheels);BSC/3in1    Recommendations for Other Services       Precautions / Restrictions Precautions Precautions: Fall;Back Precaution Comments: no brace needed Restrictions Weight Bearing Restrictions: No     Mobility  Bed Mobility Overal bed mobility: Needs Assistance Bed Mobility: Sit to Sidelying, Supine to Sit     Supine to sit: Mod assist, Max assist, +2 for physical assistance Sit to supine: Mod assist, +2 for physical assistance   General bed mobility comments: HOB elevated for t/f out of bed; cues for each step; x2 physical assist Patient Response: Cooperative  Transfers Overall transfer level: Needs assistance Equipment  used: Rolling walker (2 wheels) Transfers: Sit to/from Stand Sit to Stand: Mod assist, Max assist, +2 physical assistance, +2 safety/equipment           General transfer comment: two lateral steps to the L; poor posture in standing.    Ambulation/Gait               General Gait Details: steps limited to 2 lateral sidesteps along EOB.  unsafe to move away from bed or attempt transfer   Stairs             Wheelchair Mobility     Tilt Bed Tilt Bed Patient Response: Cooperative  Modified Rankin (Stroke Patients Only)       Balance Overall balance assessment: History of Falls, Needs assistance Sitting-balance support: Bilateral upper extremity supported Sitting balance-Leahy Scale: Fair Sitting balance - Comments: posterior lean and slouched posture. Postural control: Posterior lean Standing balance support: Bilateral upper extremity supported Standing balance-Leahy Scale: Poor Standing balance comment: stood 3x during session; unable to tolerate more than a few seconds of standing.                            Cognition Arousal: Alert Behavior During Therapy: WFL for tasks assessed/performed Overall Cognitive Status: Within Functional Limits for tasks assessed                                 General Comments: Pt appears to be mildly lethargic and "loopy"; remains appropriate during session and follows commands; feeling tired. Forgot that her daughter was in the room until she saw her; pt  stating to herself "I'm still in my hospital room".        Exercises      General Comments General comments (skin integrity, edema, etc.): On room air; daughter in room throughout session      Pertinent Vitals/Pain Pain Assessment Pain Assessment: 0-10 Pain Score: 5  Pain Location: back Pain Descriptors / Indicators: Discomfort, Grimacing Pain Intervention(s): Limited activity within patient's tolerance, Monitored during session,  Repositioned, Premedicated before session    Home Living                          Prior Function            PT Goals (current goals can now be found in the care plan section) Progress towards PT goals: Progressing toward goals    Frequency    7X/week      PT Plan      Co-evaluation PT/OT/SLP Co-Evaluation/Treatment: Yes Reason for Co-Treatment: To address functional/ADL transfers;Complexity of the patient's impairments (multi-system involvement) PT goals addressed during session: Mobility/safety with mobility OT goals addressed during session: ADL's and self-care;Proper use of Adaptive equipment and DME      AM-PAC PT "6 Clicks" Mobility   Outcome Measure  Help needed turning from your back to your side while in a flat bed without using bedrails?: A Lot Help needed moving from lying on your back to sitting on the side of a flat bed without using bedrails?: A Lot Help needed moving to and from a bed to a chair (including a wheelchair)?: Total Help needed standing up from a chair using your arms (e.g., wheelchair or bedside chair)?: A Lot Help needed to walk in hospital room?: Total Help needed climbing 3-5 steps with a railing? : Total 6 Click Score: 9    End of Session Equipment Utilized During Treatment: Gait belt Activity Tolerance: No increased pain;Patient limited by fatigue Patient left: in bed;with bed alarm set;with call bell/phone within reach;with family/visitor present Nurse Communication: Mobility status PT Visit Diagnosis: Unsteadiness on feet (R26.81);Muscle weakness (generalized) (M62.81);History of falling (Z91.81);Dizziness and giddiness (R42);Pain;Difficulty in walking, not elsewhere classified (R26.2)     Time: 1610-9604 PT Time Calculation (min) (ACUTE ONLY): 25 min  Charges:    $Therapeutic Activity: 8-22 mins PT General Charges $$ ACUTE PT VISIT: 1 Visit                    Danielle Dess, PTA 07/10/23, 1:15 PM

## 2023-07-11 DIAGNOSIS — M4714 Other spondylosis with myelopathy, thoracic region: Secondary | ICD-10-CM | POA: Diagnosis not present

## 2023-07-11 DIAGNOSIS — I10 Essential (primary) hypertension: Secondary | ICD-10-CM | POA: Diagnosis not present

## 2023-07-11 DIAGNOSIS — I480 Paroxysmal atrial fibrillation: Secondary | ICD-10-CM | POA: Diagnosis not present

## 2023-07-11 NOTE — Progress Notes (Signed)
Progress Note   Patient: Suzanne Dixon WUJ:[id number] DOB: 12-24-1950 DOA: 06/29/2023     5 DOS: the patient was seen and examined on 07/11/2023   Brief hospital course:  NORIANA ECHAVARRIA is a 72 y.o. female with medical history significant of recurrent UTI, Parkinson's disease, hypertension, OSA on CPAP, atrial fibrillation on Warfarin, lymphedema, degenerative disc disease, breast cancer s/p chemo mastectomy and radiation, hypothyroidism, neuropathy, gout, who presents to the ED due to back pain.  Lumbar MRI showed severe spinal stenosis on L3-L4.  Patient also has significant right leg weakness. Consulted neurosurgery, MRI of the thoracic spine was also performed, showed severe stenosis in T11-T12 which was responsible for leg weakness.  Surgery 10/18.    Principal Problem:   Acute back pain Active Problems:   Acute cystitis   AF (paroxysmal atrial fibrillation) (HCC)   Hypertension   Obesity (BMI 30-39.9)   Apnea, sleep   Breast cancer (HCC)   Large granular lymphocytic leukemia (HCC)   Lymphedema   CVA (cerebral vascular accident) (HCC)   Hypothyroidism   Parkinsonism (HCC)   Thoracic spondylosis with myelopathy   Weakness of both lower extremities   Spinal stenosis, thoracic   Spinal stenosis of thoracic region   Assessment and Plan: Acute back pain with severe lumbar stenosis. Severe thoracic spine stenosis the right leg weakness. Patient has been treated with pain medicine, pain is better.  However, patient still has significant right leg weakness.  Consult from neurosurgery obtained, MRI of the thoracic spine was also obtained, showed severe stenosis in T 11 to T12, which he appeared to be the source of right leg weakness.  Patient had a spinal surgery performed on 10/18, postoperatively, patient doing well.   Patient doing well today, continue PT/OT, pending nursing home placement. Discussed with neurosurgery, wound VAC likely be removed tomorrow versus before discharge  from the hospital.  Anticoagulation will be on hold for 7 to 14 days, prefer 14 days.   Acute cystitis Recently patient has been treated with acute UTI.  Blood culture at this time was negative, no need of antibiotics.     AF (paroxysmal atrial fibrillation) (HCC) - Decrease home Metoprolol given bradycardia Warfarin discontinued due to scheduled surgery.  Received 2 FFP in the morning.  INR dropped down to 1.6. Due to large surgery, neurosurgery wanted off warfarin, but okay to start prophylaxis. Anticoagulation should be off for 7 to 14 days due to large surgery.   Parkinsonism (HCC) - Continue home regimen   Hypothyroidism - Continue home regimen   Large granular lymphocytic leukemia (HCC) Follows with oncology, currently under surveillance only.  No evidence of osseous lesion seen on CT imaging.   Obstruct sleep apnea Morbid obesity with BMI 40.11. - CPAP at bedtime   Hypertension - Continue home regimen   Stage II pressure ulcer of the right buttocks. POA Follow-up with RN.      Subjective:  Patient doing well today, still has some back pain.  Physical Exam: Vitals:   07/10/23 1701 07/10/23 1933 07/11/23 0429 07/11/23 0747  BP: 107/70 118/73 125/76 116/70  Pulse: 75 80 86 79  Resp: 19 18 18 18   Temp: (!) 97.5 F (36.4 C) 97.8 F (36.6 C) (!) 97.5 F (36.4 C) 97.8 F (36.6 C)  TempSrc: Oral Oral Oral   SpO2: 100% 100% 93% 100%  Weight:      Height:       General exam: Appears calm and comfortable  Respiratory system: Clear to  auscultation. Respiratory effort normal. Cardiovascular system: S1 & S2 heard, RRR. No JVD, murmurs, rubs, gallops or clicks. No pedal edema. Gastrointestinal system: Abdomen is nondistended, soft and nontender. No organomegaly or masses felt. Normal bowel sounds heard. Central nervous system: Alert and oriented. No focal neurological deficits. Extremities: Symmetric 5 x 5 power. Skin: No rashes, lesions or ulcers Psychiatry:  Judgement and insight appear normal. Mood & affect appropriate.    Data Reviewed:  Lab results reviewed.  Family Communication: None  Disposition: Status is: Inpatient Remains inpatient appropriate because: Severity of disease, unsafe discharge pending nursing placement.     Time spent: 35 minutes  Author: Marrion Coy, MD 07/11/2023 12:51 PM  For on call review [email or web address].

## 2023-07-11 NOTE — Progress Notes (Signed)
Physical Therapy Treatment Patient Details Name: Suzanne Dixon MRN: [id number] DOB: 04/14/1951 Today's Date: 07/11/2023   History of Present Illness Pt is a 72 year old female presenting to the ED due to back pain, now s/p T10-11 transpedicular decompression and fusion for severe spinal stenosis with weakness 07/07/23    PMH significant for recurrent UTI, Parkinson's disease, hypertension, OSA on CPAP, atrial fibrillation on Warfarin, lymphedema, degenerative disc disease, breast cancer s/p chemo mastectomy and radiation, hypothyroidism, neuropathy, gout    PT Comments  Pt in recliner upon arrival with nursing techs assist.  She participated in seated ex x 10 in chair.  +2 arrived from OT to assist with standing.  She actually does quite well today and is able to stand with min/mod a +2 for standing with less assist on second attempt. She does have  a small BM in chair which she attributes to enema earlier and time is spent on care.  She remains with OT after session.   If plan is discharge home, recommend the following: Two people to help with walking and/or transfers;Two people to help with bathing/dressing/bathroom;Assistance with cooking/housework;Assistance with feeding;Direct supervision/assist for medications management;Assist for transportation;Help with stairs or ramp for entrance   Can travel by private vehicle        Equipment Recommendations       Recommendations for Other Services       Precautions / Restrictions Precautions Precautions: Fall;Back Precaution Comments: no brace needed Restrictions Weight Bearing Restrictions: No     Mobility  Bed Mobility               General bed mobility comments: up in recliner upon entering room - up with nursing staff Patient Response: Cooperative  Transfers Overall transfer level: Needs assistance Equipment used: Rolling walker (2 wheels) Transfers: Sit to/from Stand Sit to Stand: Min assist, Mod assist, +2  safety/equipment           General transfer comment: completed two sit to stands with hands on chair arm rests (verbal cues for hand placement); able to stand x approx 30 seconds each stand. Forward flexion of hips, unable to stand completely upright despite cues.    Ambulation/Gait               General Gait Details: no steps initiated as time spend for BM care in standing   Stairs             Wheelchair Mobility     Tilt Bed Tilt Bed Patient Response: Cooperative  Modified Rankin (Stroke Patients Only)       Balance Overall balance assessment: History of Falls, Needs assistance Sitting-balance support: Bilateral upper extremity supported Sitting balance-Leahy Scale: Good Sitting balance - Comments: seated in chair today   Standing balance support: Bilateral upper extremity supported Standing balance-Leahy Scale: Poor Standing balance comment: stood 2x during session.                            Cognition Arousal: Alert Behavior During Therapy: WFL for tasks assessed/performed Overall Cognitive Status: Within Functional Limits for tasks assessed                                          Exercises Other Exercises Other Exercises: seated AROM in chair x 10 for all available ranges    General Comments General comments (  skin integrity, edema, etc.): On room air; VSS. No family in room today. Overlap with PTA at beginning of session for safety in standing.      Pertinent Vitals/Pain Pain Assessment Pain Assessment: Faces Faces Pain Scale: Hurts a little bit Pain Location: back Pain Descriptors / Indicators: Discomfort, Grimacing Pain Intervention(s): Limited activity within patient's tolerance, Monitored during session, Repositioned    Home Living                          Prior Function            PT Goals (current goals can now be found in the care plan section) Progress towards PT goals: Progressing  toward goals    Frequency    7X/week      PT Plan      Co-evaluation              AM-PAC PT "6 Clicks" Mobility   Outcome Measure  Help needed turning from your back to your side while in a flat bed without using bedrails?: A Lot Help needed moving from lying on your back to sitting on the side of a flat bed without using bedrails?: A Lot Help needed moving to and from a bed to a chair (including a wheelchair)?: A Lot Help needed standing up from a chair using your arms (e.g., wheelchair or bedside chair)?: A Lot Help needed to walk in hospital room?: Total Help needed climbing 3-5 steps with a railing? : Total 6 Click Score: 10    End of Session Equipment Utilized During Treatment: Gait belt Activity Tolerance: No increased pain;Patient limited by fatigue Patient left: in chair;with chair alarm set;with call bell/phone within reach;Other (comment) Nurse Communication: Mobility status PT Visit Diagnosis: Unsteadiness on feet (R26.81);Muscle weakness (generalized) (M62.81);History of falling (Z91.81);Dizziness and giddiness (R42);Pain;Difficulty in walking, not elsewhere classified (R26.2)     Time: 1191-4782 PT Time Calculation (min) (ACUTE ONLY): 19 min  Charges:    $Therapeutic Activity: 8-22 mins PT General Charges $$ ACUTE PT VISIT: 1 Visit                   Danielle Dess, PTA 07/11/23, 10:32 AM

## 2023-07-11 NOTE — Progress Notes (Signed)
Occupational Therapy Treatment Patient Details Name: VONNETTA SCHMIEG MRN: [id number] DOB: 1951-08-22 Today's Date: 07/11/2023   History of present illness Pt is a 72 year old female presenting to the ED due to back pain, now s/p T10-11 transpedicular decompression and fusion for severe spinal stenosis with weakness 07/07/23    PMH significant for recurrent UTI, Parkinson's disease, hypertension, OSA on CPAP, atrial fibrillation on Warfarin, lymphedema, degenerative disc disease, breast cancer s/p chemo mastectomy and radiation, hypothyroidism, neuropathy, gout   OT comments  Pt received seated in recliner, PTA in room. Appearing alert; willing to work with OT on sit to stand transfers, grooming. T/f MIN-MOD x1 (+2 for safety), able to stand for approx 30 seconds; completed two transfers to standing; fatigued after this level of activity. See flowsheet below for further details of session. Left seated in recliner, BIL LE elevated and supported with pillows, with all needs in reach.  Patient will benefit from continued OT while in acute care.       If plan is discharge home, recommend the following:  A lot of help with bathing/dressing/bathroom;Assistance with cooking/housework;Help with stairs or ramp for entrance;Two people to help with walking and/or transfers   Equipment Recommendations  Other (comment) (defer to next venue)    Recommendations for Other Services      Precautions / Restrictions Precautions Precautions: Fall;Back Restrictions Weight Bearing Restrictions: No       Mobility Bed Mobility               General bed mobility comments: Not tested today; pt found in chair and left in chair at end of session    Transfers Overall transfer level: Needs assistance Equipment used: Rolling walker (2 wheels) Transfers: Sit to/from Stand Sit to Stand: Min assist, Mod assist (with cues, +2 for safety, but pt only requiring physical assist of +1.)           General  transfer comment: completed two sit to stands with hands on chair arm rests (verbal cues for hand placement); able to stand x approx 30 seconds each stand. Forward flexion of hips, unable to stand completely upright despite cues.     Balance Overall balance assessment: History of Falls, Needs assistance Sitting-balance support: Bilateral upper extremity supported Sitting balance-Leahy Scale: Good Sitting balance - Comments: seated in chair today   Standing balance support: Bilateral upper extremity supported Standing balance-Leahy Scale: Poor Standing balance comment: stood 2x during session.                           ADL either performed or assessed with clinical judgement   ADL Overall ADL's : Needs assistance/impaired     Grooming: Sitting;Supervision/safety;Set up;Oral care;Minimal assistance Grooming Details (indicate cue type and reason): Pt requiring slight steadying of hands/tools at times 2/2 tremors more significant today.                     Toileting- Architect and Hygiene: Sit to/from stand;Total assistance Toileting - Clothing Manipulation Details (indicate cue type and reason): Total assist while standing for bowel hygiene       General ADL Comments: Standing from chair for bowel hygiene today; small amount of bowel incontinence on pad; pad changed. Pt able to stand for approx 30 second; forward flexion at hip in standing.    Extremity/Trunk Assessment Upper Extremity Assessment Upper Extremity Assessment: Generalized weakness (some Parkinson's tremors)   Lower Extremity Assessment Lower Extremity Assessment:  Defer to PT evaluation        Vision       Perception     Praxis      Cognition Arousal: Alert Behavior During Therapy: [facility] for tasks assessed/performed Overall Cognitive Status: Within Functional Limits for tasks assessed                                 General Comments: Pleasant and more alert and  oriented today.        Exercises      Shoulder Instructions       General Comments On room air; VSS. No family in room today. Overlap with PTA at beginning of session for safety in standing.    Pertinent Vitals/ Pain       Pain Assessment Pain Assessment: 0-10 Pain Score: 2  Pain Location: back Pain Descriptors / Indicators: Discomfort, Grimacing Pain Intervention(s): Limited activity within patient's tolerance, Monitored during session  Home Living                                          Prior Functioning/Environment              Frequency  Min 1X/week        Progress Toward Goals  OT Goals(current goals can now be found in the care plan section)  Progress towards OT goals: Progressing toward goals  Acute Rehab OT Goals Patient Stated Goal: Get better; walk again OT Goal Formulation: With patient Time For Goal Achievement: 07/23/23 Potential to Achieve Goals: Good ADL Goals Pt Will Perform Grooming: sitting;standing;with modified independence Pt Will Perform Lower Body Dressing: with modified independence;sitting/lateral leans Pt Will Transfer to Toilet: with contact guard assist;bedside commode;stand pivot transfer Pt Will Perform Toileting - Clothing Manipulation and hygiene: with contact guard assist;sitting/lateral leans  Plan      Co-evaluation                 AM-PAC OT "6 Clicks" Daily Activity     Outcome Measure   Help from another person eating meals?: None Help from another person taking care of personal grooming?: A Little Help from another person toileting, which includes using toliet, bedpan, or urinal?: Total Help from another person bathing (including washing, rinsing, drying)?: A Lot Help from another person to put on and taking off regular upper body clothing?: A Lot Help from another person to put on and taking off regular lower body clothing?: Total 6 Click Score: 13    End of Session Equipment Utilized  During Treatment: Gait belt;Rolling walker (2 wheels)  OT Visit Diagnosis: Unsteadiness on feet (R26.81);Other abnormalities of gait and mobility (R26.89);Repeated falls (R29.6);Muscle weakness (generalized) (M62.81)   Activity Tolerance Patient tolerated treatment well   Patient Left in chair;with call bell/phone within reach;with chair alarm set (BIL LE elevated and supported with pillows)   Nurse Communication Mobility status        Time: 4098-1191 OT Time Calculation (min): 19 min  Charges: OT General Charges $OT Visit: 1 Visit OT Treatments $Self Care/Home Management : 8-22 mins  Linward Foster, MS, OTR/L  Alvester Morin 07/11/2023, 10:14 AM

## 2023-07-11 NOTE — Progress Notes (Signed)
   Neurosurgery Progress Note  History: OLUCHI CANUP is s/p T10-11 transpedicular decompression and fusion on 10/18  POD4: NAEO. Pt feeling sore after therapy  POD3: improved lower extremity strength this morning. Continues to have some low back and buttock pain   Physical Exam: Vitals:   07/11/23 0429 07/11/23 0747  BP: 125/76 116/70  Pulse: 86 79  Resp: 18 18  Temp: (!) 97.5 F (36.4 C) 97.8 F (36.6 C)  SpO2: 93% 100%    AA Ox3 CNI  Strength:  5/5 throughout BUE  Side Iliopsoas Quads Hamstring PF DF EHL  R 4+ 4- 4- 4+ 4+ 4-  L 4+ 4+ 4 4+ 4+ 4+    Data:  Other tests/results: NA  Assessment/Plan:  PAQUITA GRUN is a 72 y.o presenting with progressive lower extremity weakness and radiating leg pain s/p thoracic decompression and fusion.   - mobilize - pain control - ok for DVT prophylaxis from neurosurgical standpoint - PTOT - HV removed 10/21 - continue wound vac. Will remove on POD5 or day of discharge should she be ready for d/c prior to POD5.  Manning Charity PA-C Department of Neurosurgery

## 2023-07-11 NOTE — TOC Progression Note (Addendum)
Transition of Care [facility]) - Progression Note    Patient Details  Name: Suzanne Dixon MRN: [id number] Date of Birth: 06-06-1951  Transition of Care [facility]) CM/SW Contact  Margarito Liner, LCSW Phone Number: 07/11/2023, 1:40 PM  Clinical Narrative: [facility] declined referral. Patient is aware. Discussed accepting bed offer from [facility] vs sending referral out to other counties. [place] family [place]. She wants to call daughter to discuss options. In the meantime, CSW sent referral to [facility] for review.    2:18 pm: SDOH flag for food insecurity. CSW added resources to AVS.  4:04 pm: CSW met with patient and daughter. [facility] has offered a bed. They will discuss the options and follow up with decision.  Expected Discharge Plan and Services                                               Social Determinants of Health (SDOH) Interventions SDOH Screenings   Food Insecurity: Food Insecurity Present (06/29/2023)  Housing: Low Risk  (06/29/2023)  Transportation Needs: No Transportation Needs (06/29/2023)  Recent Concern: Transportation Needs - Unmet Transportation Needs (05/02/2023)   Received from [facility]  Utilities: Not At Risk (06/29/2023)  Financial Resource Strain: Low Risk  (06/09/2023)   Received from [facility]  Recent Concern: Financial Resource Strain - High Risk (05/02/2023)   Received from [facility]  Physical Activity: Insufficiently Active (05/02/2023)   Received from [facility]  Social Connections: Moderately Isolated (05/02/2023)   Received from [facility]  Stress: Stress Concern Present (05/02/2023)   Received from [facility]  Tobacco Use: Low Risk  (06/29/2023)  Health Literacy: Inadequate Health Literacy (05/02/2023)   Received from [facility]     Readmission Risk Interventions    06/21/2021    4:12 PM  Readmission Risk Prevention Plan  Transportation Screening Complete  PCP or Specialist Appt within 3-5 Days Complete  HRI or Home Care Consult Complete  Social Work Consult for Recovery Care Planning/Counseling Complete  Palliative Care Screening Not Applicable  Medication Review Oceanographer) Complete

## 2023-07-12 DIAGNOSIS — N3001 Acute cystitis with hematuria: Secondary | ICD-10-CM | POA: Diagnosis not present

## 2023-07-12 DIAGNOSIS — G4739 Other sleep apnea: Secondary | ICD-10-CM

## 2023-07-12 DIAGNOSIS — M5489 Other dorsalgia: Secondary | ICD-10-CM

## 2023-07-12 DIAGNOSIS — M5415 Radiculopathy, thoracolumbar region: Secondary | ICD-10-CM

## 2023-07-12 DIAGNOSIS — M4805 Spinal stenosis, thoracolumbar region: Secondary | ICD-10-CM | POA: Diagnosis not present

## 2023-07-12 DIAGNOSIS — I480 Paroxysmal atrial fibrillation: Secondary | ICD-10-CM | POA: Diagnosis not present

## 2023-07-12 LAB — CBC
HCT: 31.4 % — ABNORMAL LOW (ref 36.0–46.0)
Hemoglobin: 10.7 g/dL — ABNORMAL LOW (ref 12.0–15.0)
MCH: 31.7 pg (ref 26.0–34.0)
MCHC: 34.1 g/dL (ref 30.0–36.0)
MCV: 92.9 fL (ref 80.0–100.0)
Platelets: 274 10*3/uL (ref 150–400)
RBC: 3.38 MIL/uL — ABNORMAL LOW (ref 3.87–5.11)
RDW: 13.9 % (ref 11.5–15.5)
WBC: 7.5 10*3/uL (ref 4.0–10.5)
nRBC: 0 % (ref 0.0–0.2)

## 2023-07-12 NOTE — Assessment & Plan Note (Signed)
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  Patient had transpedicular decompression of the thoracic spinal cord, posterior spinal instrumentation T10-11, posterior lateral arthrodesis T10-11, allograft placement.  Continue physical therapy.

## 2023-07-12 NOTE — Progress Notes (Signed)
Physical Therapy Treatment Patient Details Name: Suzanne Dixon MRN: [id number] DOB: [birth year] Today's Date: 07/12/2023   History of Present Illness Pt is a 72 year old female presenting to the ED due to back pain, now s/p T10-11 transpedicular decompression and fusion for severe spinal stenosis with weakness 07/07/23    PMH significant for recurrent UTI, Parkinson's disease, hypertension, OSA on CPAP, atrial fibrillation on Warfarin, lymphedema, degenerative disc disease, breast cancer s/p chemo mastectomy and radiation, hypothyroidism, neuropathy, gout    PT Comments  Pt was long sitting in bed upon arrival. She is alert and agreeable but endorses 4-5/10 pain before any movements. She does agree to getting OOB. Max assist to exit L side of bed. Pt tolerated standing EOB 2 x prior to stand pivot to recliner. Min-mod assist of one to stand from elevated bed height however max assist to pivot to recliner. Pt has knee buckling with any attempt to lift/clear R or L LE to advance to taking steps. Pt is high fall risk. Wound vac/ incision site clean. Pt was left ion recliner with call bell in reach and RN aware. Pt will require +2 assistance to safely return from recliner to EOB. Acute PT will continue to follow and progress per current POC. STR best DC disposition to maximize her safety and independence with all ADLs.    If plan is discharge home, recommend the following: Two people to help with walking and/or transfers;Two people to help with bathing/dressing/bathroom;Assistance with cooking/housework;Assistance with feeding;Direct supervision/assist for medications management;Assist for transportation;Help with stairs or ramp for entrance     Equipment Recommendations  Rolling walker (2 wheels);BSC/3in1       Precautions / Restrictions Precautions Precautions: Fall;Back Precaution Booklet Issued: No Precaution Comments: no brace needed Restrictions Weight Bearing Restrictions: No      Mobility  Bed Mobility Overal bed mobility: Needs Assistance Bed Mobility: Supine to Sit Rolling: Max assist, Used rails Sidelying to sit: Max assist, Used rails Supine to sit: Max assist  General bed mobility comments: pt continues to required extensive assistance to exit L side of bed. Vcs for step by step sequencing    Transfers Overall transfer level: Needs assistance Equipment used: Rolling walker (2 wheels) Transfers: Sit to/from Stand, Bed to chair/wheelchair/BSC Sit to Stand: Min assist, Mod assist, From elevated surface Stand pivot transfers: Max assist, From elevated surface  General transfer comment: pt was able to stand 2 x EOB prior to stand pivot to recliner. increased time required. pt is very fearful of falling. Min assist to stand from elevated bed height but max assist to stand pivot to recliner. pt unable to clear R/L LE to advance to taking steps due to knees buckling    Ambulation/Gait  General Gait Details: Unable. BLEs R then L buckling with atytempts to progress to taking steps    Balance Overall balance assessment: History of Falls, Needs assistance Sitting-balance support: Bilateral upper extremity supported Sitting balance-Leahy Scale: Good     Standing balance support: Bilateral upper extremity supported Standing balance-Leahy Scale: Poor Standing balance comment: pt is high fall risk even with BUE support ion RW       Cognition Arousal: Alert Behavior During Therapy: WFL for tasks assessed/performed Overall Cognitive Status: Within Functional Limits for tasks assessed    General Comments: Pt is alert and agreeable. slow processing with increased time to respond           General Comments General comments (skin integrity, edema, etc.): reviewed importance of performing activity/exercises  throughout the day to promote strengthening return in functional abilities.      Pertinent Vitals/Pain Pain Assessment Pain Assessment: 0-10 Pain  Score: 5  Pain Location: back Pain Descriptors / Indicators: Discomfort, Grimacing Pain Intervention(s): Limited activity within patient's tolerance, Monitored during session, Premedicated before session, Repositioned     PT Goals (current goals can now be found in the care plan section) Acute Rehab PT Goals Patient Stated Goal: to get back to being as before." Progress towards PT goals: Progressing toward goals    Frequency    7X/week       Co-evaluation     PT goals addressed during session: Mobility/safety with mobility;Balance;Proper use of DME;Strengthening/ROM        AM-PAC PT "6 Clicks" Mobility   Outcome Measure  Help needed turning from your back to your side while in a flat bed without using bedrails?: A Lot Help needed moving from lying on your back to sitting on the side of a flat bed without using bedrails?: A Lot Help needed moving to and from a bed to a chair (including a wheelchair)?: A Lot Help needed standing up from a chair using your arms (e.g., wheelchair or bedside chair)?: Total Help needed to walk in hospital room?: Total Help needed climbing 3-5 steps with a railing? : Total 6 Click Score: 9    End of Session Equipment Utilized During Treatment: Gait belt Activity Tolerance: Patient tolerated treatment well;Patient limited by fatigue;Other (comment) (limited by fear of falling) Patient left: in chair;with chair alarm set;with call bell/phone within reach;Other (comment) Nurse Communication: Mobility status PT Visit Diagnosis: Unsteadiness on feet (R26.81);Muscle weakness (generalized) (M62.81);History of falling (Z91.81);Dizziness and giddiness (R42);Pain;Difficulty in walking, not elsewhere classified (R26.2)     Time: 5621-3086 PT Time Calculation (min) (ACUTE ONLY): 26 min  Charges:    $Therapeutic Activity: 23-37 mins PT General Charges $$ ACUTE PT VISIT: 1 Visit                     Jetta Lout PTA 07/12/23, 11:55 AM

## 2023-07-12 NOTE — Progress Notes (Signed)
Occupational Therapy Treatment Patient Details Name: Suzanne Dixon MRN: [id number] DOB: September 19, 1951 Today's Date: 07/12/2023   History of present illness Pt is a 72 year old female presenting to the ED due to back pain, now s/p T10-11 transpedicular decompression and fusion for severe spinal stenosis with weakness 07/07/23    PMH significant for recurrent UTI, Parkinson's disease, hypertension, OSA on CPAP, atrial fibrillation on Warfarin, lymphedema, degenerative disc disease, breast cancer s/p chemo mastectomy and radiation, hypothyroidism, neuropathy, gout   OT comments  Chart reviewed prior to tx session. Pt seen for OT treatment on this date. Pt family at bedside throughout session. Upon arrival to room Pt sitting up in recliner. Tx session targeted improved ADL activity tolerance and independence. Pt was alert and orientated to throughout session. Pt tolerated standing for ~1 min standing to complete MAX A peri care, before stated she has to return to sitting - MINA + RW to assist standing. First standing: RW and MODA + 2, Second STS for peri care Pt required RW and MIN-MOD +2 to stand. Pt requires MAXA for step pivot t/f from recliner <> bed. Bed mobility for final position; MAXA for LE management and MAXA + 2 for scooting up in bed. On this day Pt showed progress in requiring less physical assistance to STS and ability to remain standing. Pt making good progress toward goals, will continue to follow POC. Discharge recommendation remains appropriate. OT will follow acutely.           If plan is discharge home, recommend the following:  A lot of help with bathing/dressing/bathroom;Assistance with cooking/housework;Help with stairs or ramp for entrance;Two people to help with walking and/or transfers   Equipment Recommendations  Other (comment)    Recommendations for Other Services      Precautions / Restrictions Precautions Precautions: Fall;Back Precaution Booklet Issued: No Precaution  Comments: no brace needed Restrictions Weight Bearing Restrictions: No       Mobility Bed Mobility Overal bed mobility: Needs Assistance Bed Mobility: Rolling, Sit to Sidelying Rolling: Used rails, Max assist Sidelying to sit: Max assist, Used rails   Sit to supine: Mod assist, +2 for physical assistance Sit to sidelying: Max assist, +2 for safety/equipment General bed mobility comments: LE management    Transfers Overall transfer level: Needs assistance Equipment used: Rolling walker (2 wheels) Transfers: Sit to/from Stand, Bed to chair/wheelchair/BSC Sit to Stand: Mod assist, Min assist, +2 physical assistance     Step pivot transfers: Max assist, +2 physical assistance           Balance Overall balance assessment: History of Falls, Needs assistance Sitting-balance support: Bilateral upper extremity supported Sitting balance-Leahy Scale: Good   Postural control: Posterior lean Standing balance support: Bilateral upper extremity supported Standing balance-Leahy Scale: Poor                             ADL either performed or assessed with clinical judgement   ADL Overall ADL's : Needs assistance/impaired                     Lower Body Dressing: Maximal assistance Lower Body Dressing Details (indicate cue type and reason): donning socks Toilet Transfer: Rolling walker (2 wheels);Maximal assistance;+2 for physical assistance Toilet Transfer Details (indicate cue type and reason): simulated toilet t/f - step pivot Toileting- Clothing Manipulation and Hygiene: Sit to/from stand;Maximal assistance Toileting - Clothing Manipulation Details (indicate cue type and reason): MAXA while  standing for peri hygiene     Functional mobility during ADLs: +2 for physical assistance;Maximal assistance;Rolling walker (2 wheels) General ADL Comments: Standing from chair for peri care hygiene today; small amount of bowel incontinence on pad; pad changed. Pt able to  stand for approx ~1 min before needing to sit, attempted again standing for another minute during peri care; forward flexion at hip in standing.    Extremity/Trunk Assessment              Vision       Perception     Praxis      Cognition Arousal: Alert Behavior During Therapy: WFL for tasks assessed/performed Overall Cognitive Status: Within Functional Limits for tasks assessed                                 General Comments: Pt alert and motivated to work with therapy        Exercises Other Exercises Other Exercises: Re edu: safe t/f techniques    Shoulder Instructions       General Comments wound vac intact pre/post session    Pertinent Vitals/ Pain       Pain Assessment Pain Assessment: No/denies pain  Home Living                                          Prior Functioning/Environment              Frequency  Min 1X/week        Progress Toward Goals  OT Goals(current goals can now be found in the care plan section)  Progress towards OT goals: Progressing toward goals     Plan      Co-evaluation          OT goals addressed during session: ADL's and self-care;Proper use of Adaptive equipment and DME      AM-PAC OT "6 Clicks" Daily Activity     Outcome Measure   Help from another person eating meals?: None Help from another person taking care of personal grooming?: A Little Help from another person toileting, which includes using toliet, bedpan, or urinal?: A Lot Help from another person bathing (including washing, rinsing, drying)?: A Lot Help from another person to put on and taking off regular upper body clothing?: A Lot Help from another person to put on and taking off regular lower body clothing?: Total 6 Click Score: 14    End of Session Equipment Utilized During Treatment: Gait belt;Rolling walker (2 wheels)  OT Visit Diagnosis: Unsteadiness on feet (R26.81);Other abnormalities of gait and  mobility (R26.89);Repeated falls (R29.6);Muscle weakness (generalized) (M62.81)   Activity Tolerance Patient tolerated treatment well   Patient Left with bed alarm set;in bed;with call bell/phone within reach   Nurse Communication Mobility status (small open sacral)        Time: 1610-9604 OT Time Calculation (min): 24 min  Charges: OT General Charges $OT Visit: 1 Visit OT Treatments $Self Care/Home Management : 8-22 mins $Therapeutic Activity: 8-22 mins  [employer], OTS

## 2023-07-12 NOTE — TOC Progression Note (Signed)
Transition of Care [facility]) - Progression Note    Patient Details  Name: Suzanne Dixon MRN: [id number] Date of Birth: 04-23-1951  Transition of Care [facility]) CM/SW Contact  Margarito Liner, LCSW Phone Number: 07/12/2023, 10:09 AM  Clinical Narrative:   Patient left CSW a voicemail stating that she was accepting bed offer from [facility]. Admissions coordinator is aware. CSW started insurance authorization.  Expected Discharge Plan and Services                                               Social Determinants of Health (SDOH) Interventions SDOH Screenings   Food Insecurity: Food Insecurity Present (06/29/2023)  Housing: Low Risk  (06/29/2023)  Transportation Needs: No Transportation Needs (06/29/2023)  Recent Concern: Transportation Needs - Unmet Transportation Needs (05/02/2023)   Received from [facility]  Utilities: Not At Risk (06/29/2023)  Financial Resource Strain: Low Risk  (06/09/2023)   Received from [facility]  Recent Concern: Financial Resource Strain - High Risk (05/02/2023)   Received from [facility]  Physical Activity: Insufficiently Active (05/02/2023)   Received from [facility]  Social Connections: Moderately Isolated (05/02/2023)   Received from [facility]  Stress: Stress Concern Present (05/02/2023)   Received from [facility]  Tobacco Use: Low Risk  (06/29/2023)  Health Literacy: Inadequate Health Literacy (05/02/2023)   Received from [facility]    Readmission Risk Interventions    06/21/2021    4:12 PM  Readmission Risk Prevention Plan  Transportation Screening Complete  PCP or Specialist Appt within 3-5 Days Complete  HRI or Home Care Consult Complete  Social Work Consult for Recovery Care Planning/Counseling Complete  Palliative Care Screening Not Applicable  Medication Review Oceanographer) Complete

## 2023-07-12 NOTE — Assessment & Plan Note (Signed)
BMI 37.76

## 2023-07-12 NOTE — Progress Notes (Signed)
Progress Note   Patient: Suzanne Dixon DGU:[id number] DOB: 1951/05/16 DOA: 06/29/2023     6 DOS: the patient was seen and examined on 07/12/2023   Brief hospital course:  Suzanne Dixon is a 72 y.o. female with medical history significant of recurrent UTI, Parkinson's disease, hypertension, OSA on CPAP, atrial fibrillation on Warfarin, lymphedema, degenerative disc disease, breast cancer s/p chemo mastectomy and radiation, hypothyroidism, neuropathy, gout, who presents to the ED due to back pain.  Lumbar MRI showed severe spinal stenosis on L3-L4.  Patient also has significant right leg weakness. Consulted neurosurgery, MRI of the thoracic spine was also performed, showed severe stenosis in T11-T12 which was responsible for leg weakness.  Surgery 10/18.  10/23.  Patient able to straight leg raise without a problem.  Not having much back pain at all.   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  Patient had transpedicular decompression of the thoracic spinal cord, posterior spinal instrumentation T10-11, posterior lateral arthrodesis T10-11, allograft placement.  Continue physical therapy.  Acute back pain    Acute cystitis Treated  AF (paroxysmal atrial fibrillation) (HCC) Patient on metoprolol 25 mg twice a day.  This is a lower dose than when she came in.  Off warfarin postoperatively.  Would like to hold at least 7 days likely 14.  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    Apnea, sleep - CPAP at bedtime  Obesity (BMI 30-39.9) BMI 37.76  Hypertension Continue metoprolol        Subjective: Patient feels okay.  Not having much discomfort.  Able  to move her right leg better than when she came in.  Physical Exam: Vitals:   07/11/23 1607 07/11/23 2031 07/12/23 0449 07/12/23 0450  BP: 112/70 106/62 92/71 101/64  Pulse: (!) 56 87 86 84  Resp: 16 18 18 18   Temp:  98 F (36.7 C) 97.9 F (36.6 C) 97.9 F (36.6 C)  TempSrc:      SpO2: 100% 100% 97% 97%  Weight:      Height:       Physical Exam HENT:     Head: Normocephalic.     Mouth/Throat:     Pharynx: No oropharyngeal exudate.  Eyes:     General: Lids are normal.     Conjunctiva/sclera: Conjunctivae normal.  Cardiovascular:     Rate and Rhythm: Normal rate and regular rhythm.     Heart sounds: Normal heart sounds, S1 normal and S2 normal.  Pulmonary:     Breath sounds: No decreased breath sounds, wheezing, rhonchi or rales.  Abdominal:     Palpations: Abdomen is soft.     Tenderness: There is no abdominal tenderness.  Musculoskeletal:     Right lower leg: No swelling.     Left lower leg: No swelling.  Skin:    General: Skin is warm.     Findings: No rash.  Neurological:     Mental Status: She is alert and oriented to person, place, and time.     Comments: Able to straight leg raise bilaterally.     Data Reviewed: Creatinine 0.86, hemoglobin 10.7, white blood cell count 7.5, platelet count 274  Family Communication: Spoke with daughter on the phone.  Disposition: Status is: Inpatient Remains inpatient appropriate because:  Starting insurance authorization for rehab.  Planned Discharge Destination: Skilled nursing facility    Time spent: 28 minutes  Author: Alford Highland, MD 07/12/2023 4:13 PM  For on call review [email or web address].

## 2023-07-13 ENCOUNTER — Telehealth: Payer: Self-pay | Admitting: Neurosurgery

## 2023-07-13 DIAGNOSIS — M4805 Spinal stenosis, thoracolumbar region: Secondary | ICD-10-CM | POA: Diagnosis not present

## 2023-07-13 DIAGNOSIS — G4733 Obstructive sleep apnea (adult) (pediatric): Secondary | ICD-10-CM

## 2023-07-13 DIAGNOSIS — N3001 Acute cystitis with hematuria: Secondary | ICD-10-CM | POA: Diagnosis not present

## 2023-07-13 DIAGNOSIS — E039 Hypothyroidism, unspecified: Secondary | ICD-10-CM | POA: Diagnosis not present

## 2023-07-13 MED ORDER — HYDROCODONE-ACETAMINOPHEN 5-325 MG PO TABS: 1.0000 | ORAL_TABLET | Freq: Four times a day (QID) | ORAL | 0 refills | Status: AC | PRN

## 2023-07-13 MED ORDER — POLYETHYLENE GLYCOL 3350 17 G PO PACK: 17.0000 g | PACK | Freq: Every day | ORAL | 0 refills | Status: DC | PRN

## 2023-07-13 MED ORDER — METOPROLOL TARTRATE 25 MG PO TABS: 25.0000 mg | ORAL_TABLET | Freq: Two times a day (BID) | ORAL | 0 refills | Status: DC

## 2023-07-13 MED ORDER — GABAPENTIN 300 MG PO CAPS: 300.0000 mg | ORAL_CAPSULE | Freq: Four times a day (QID) | ORAL | 0 refills | Status: AC

## 2023-07-13 MED ORDER — WARFARIN SODIUM 1 MG PO TABS: 3.0000 mg | ORAL_TABLET | Freq: Every day | ORAL | Status: DC

## 2023-07-13 MED ORDER — METHOCARBAMOL 500 MG PO TABS: 500.0000 mg | ORAL_TABLET | Freq: Four times a day (QID) | ORAL | 0 refills | Status: DC | PRN

## 2023-07-13 MED ORDER — HYDROCODONE-ACETAMINOPHEN 5-325 MG PO TABS: 1.0000 | ORAL_TABLET | Freq: Four times a day (QID) | ORAL | 0 refills | Status: DC | PRN

## 2023-07-13 MED ORDER — WARFARIN SODIUM 6 MG PO TABS: 6.0000 mg | ORAL_TABLET | Freq: Every day | ORAL | Status: DC

## 2023-07-13 MED ORDER — AMLODIPINE BESYLATE 10 MG PO TABS: 10.0000 mg | ORAL_TABLET | Freq: Every day | ORAL | 0 refills | Status: DC

## 2023-07-13 MED ORDER — ENSURE ENLIVE PO LIQD: 237.0000 mL | Freq: Two times a day (BID) | ORAL | 0 refills | Status: DC

## 2023-07-13 MED ORDER — ACETAMINOPHEN 325 MG PO TABS: 650.0000 mg | ORAL_TABLET | Freq: Four times a day (QID) | ORAL | Status: DC

## 2023-07-13 MED ORDER — GABAPENTIN 300 MG PO CAPS: 300.0000 mg | ORAL_CAPSULE | Freq: Four times a day (QID) | ORAL | 0 refills | Status: DC

## 2023-07-13 NOTE — Telephone Encounter (Addendum)
I have tried calling Suzanne Dixon's # x 6 and I have been transferred, put on hold, and hung up, or received a message stating "please try your call again later".  I tried calling the pt's home # and cell #, but there was no answer.

## 2023-07-13 NOTE — Telephone Encounter (Signed)
Her discharge summary and AVS state she should hold coumadin until 07/21/23.

## 2023-07-13 NOTE — TOC Transition Note (Signed)
Transition of Care [facility]) - CM/SW Discharge Note   Patient Details  Name: Suzanne Dixon MRN: [id number] Date of Birth: September 23, 1950  Transition of Care [facility]) CM/SW Contact:  Margarito Liner, LCSW Phone Number: 07/13/2023, 1:25 PM   Clinical Narrative:  Patient has orders to discharge to [facility]. RN will call report to [phone] (Room 508-A). [facility] [place] [place] [place] [facility] are unable to transport. [facility] will pick her up between 8:00-8:30 pm. No further concerns. CSW signing off.   Final next level of care: Skilled Nursing Facility Barriers to Discharge: Barriers Resolved   Patient Goals and CMS Choice   Choice offered to / list presented to : Patient, Adult Children  Discharge Placement PASRR number recieved: 06/30/23 PASRR number recieved: 06/30/23            Patient chooses bed at: Other - please specify in the comment section below: [facility]) Patient to be transferred to facility by: [facility] Name of family member notified: Augustina Mood Patient and family notified of of transfer: 07/13/23  Discharge Plan and Services Additional resources added to the After Visit Summary for                                       Social Determinants of Health (SDOH) Interventions SDOH Screenings   Food Insecurity: Food Insecurity Present (06/29/2023)  Housing: Low Risk  (06/29/2023)  Transportation Needs: No Transportation Needs (06/29/2023)  Recent Concern: Transportation Needs - Unmet Transportation Needs (05/02/2023)   Received from [facility]  Utilities: Not At Risk (06/29/2023)  Financial Resource Strain: Low Risk  (06/09/2023)   Received from [facility]  Recent Concern: Financial Resource Strain - High Risk (05/02/2023)   Received from [facility]  Physical Activity: Insufficiently Active (05/02/2023)   Received from  [facility]  Social Connections: Moderately Isolated (05/02/2023)   Received from [facility]  Stress: Stress Concern Present (05/02/2023)   Received from [facility]  Tobacco Use: Low Risk  (06/29/2023)  Health Literacy: Inadequate Health Literacy (05/02/2023)   Received from [facility]     Readmission Risk Interventions    06/21/2021    4:12 PM  Readmission Risk Prevention Plan  Transportation Screening Complete  PCP or Specialist Appt within 3-5 Days Complete  HRI or Home Care Consult Complete  Social Work Consult for Recovery Care Planning/Counseling Complete  Palliative Care Screening Not Applicable  Medication Review Oceanographer) Complete

## 2023-07-13 NOTE — Care Management Important Message (Signed)
Important Message  Patient Details  Name: Suzanne Dixon MRN: [id number] Date of Birth: 07-Oct-1950   Important Message Given:  Yes - Medicare IM     Margarito Liner, LCSW 07/13/2023, 10:59 AM

## 2023-07-13 NOTE — Progress Notes (Signed)
   Neurosurgery Progress Note  History: Suzanne Dixon is s/p T10-11 transpedicular decompression and fusion on 10/18  POD6: Feeling well this afternoon without concerns POD4: NAEO. Pt feeling sore after therapy  POD3: improved lower extremity strength this morning. Continues to have some low back and buttock pain   Physical Exam: Vitals:   07/12/23 2116 07/13/23 0754  BP: 128/69 122/80  Pulse: 91 86  Resp: 18 18  Temp: 97.8 F (36.6 C) 97.8 F (36.6 C)  SpO2: 99% 99%    AA Ox3 CNI  Strength:  5/5 throughout BUE  Side Iliopsoas Quads Hamstring PF DF EHL  R 4+ 4- 4- 5 5 4+  L 4+ 4+ 4 5 5 5    Incisions c/d/I with clean dressing in place Data:  Other tests/results: NA  Assessment/Plan:  Suzanne Dixon is a 72 y.o presenting with progressive lower extremity weakness and radiating leg pain s/p thoracic decompression and fusion.   - mobilize - pain control - ok for DVT prophylaxis from neurosurgical standpoint - PTOT - HV removed 10/21 - wound vac removed 10/24. Recommending daily dressing changes at d/c  Manning Charity PA-C Department of Neurosurgery

## 2023-07-13 NOTE — Telephone Encounter (Signed)
Anisa has called from [facility] to ask if the patient should continue with coumadin or no? Please advise   Anisa- [phone]

## 2023-07-13 NOTE — Telephone Encounter (Signed)
Tried to call back no answer no voicemail.  

## 2023-07-13 NOTE — Plan of Care (Signed)

## 2023-07-13 NOTE — Discharge Summary (Addendum)
Physician Discharge Summary   Patient: Suzanne Dixon MRN: [id number] DOB: 10-Jun-1951  Admit date:     06/29/2023  Discharge date: 07/13/23  Discharge Physician: Alford Highland   PCP: Lynnea Ferrier, MD   Recommendations at discharge:    Follow up with team at rehab one day. Follow up neurosurgery 07/19/23 at 1:30pm Hold coumadin until 07/21/23.  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 female with medical history significant of recurrent UTI, Parkinson's disease, hypertension, OSA on CPAP, atrial fibrillation on Warfarin, lymphedema, degenerative disc disease, breast cancer s/p chemo mastectomy and radiation, hypothyroidism, neuropathy, gout, who presents to the ED due to back pain.  Lumbar MRI showed severe spinal stenosis on L3-L4.  Patient also has significant right leg weakness. Consulted neurosurgery, MRI of the thoracic spine was also performed, showed severe stenosis in T11-T12 which was responsible for leg weakness.  Surgery 10/18.  10/23.  Patient able to straight leg raise without a problem.  Not having much back pain at all. 10/24.  Neurosurgical team to evaluate wound today and take off wound vac.  If wound looks good, can send to rehab for strength training. Neurosurgery recommended honeycomb daily dressing change for her back.  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  Patient had transpedicular decompression of the thoracic spinal cord, posterior spinal instrumentation T10-11, posterior lateral arthrodesis T10-11, allograft placement.  Continue physical therapy.  Neurosurgery took off wound vac and wants a daily honeycomb dressing to back.  Follow up neurosurgery on 07/19/23 at 1:30pm  Acute back pain    Acute cystitis Treated  AF (paroxysmal atrial fibrillation) (HCC) Patient on metoprolol 25 mg twice a day.  This is a lower dose than when she came in.  Off warfarin postoperatively.  Hold warfarin until November 1st and then can restart.   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    Apnea, sleep - CPAP at bedtime  Obesity (BMI 30-39.9) BMI 37.76  Hypertension Continue metoprolol         Consultants: Neurosurgery Procedures performed: Neurosurgical procedure thoracic spine Disposition: Rehabilitation facility Diet recommendation:  Cardiac diet DISCHARGE MEDICATION: Allergies as of 07/13/2023       Reactions   Advair Diskus [fluticasone-salmeterol] Other (See Comments)   Joint pain    Alendronate    Muscle pain   Singulair [montelukast] Other (See Comments)   "muscle pain"   Sulfa Antibiotics Hives   Venlafaxine Other (See Comments)   "altered mental status/tremors"        Medication List     STOP taking these medications    cefdinir 300 MG capsule Commonly known as: OMNICEF   meloxicam 15 MG tablet Commonly known as: MOBIC  oxybutynin 5 MG tablet Commonly known as: DITROPAN   triamcinolone 0.025 % cream Commonly known as: KENALOG       TAKE these medications    acetaminophen 325 MG tablet Commonly known as: TYLENOL Take 2 tablets (650 mg total) by  mouth every 6 (six) hours.   allopurinol 300 MG tablet Commonly known as: ZYLOPRIM Take 300 mg by mouth daily.   amLODipine 10 MG tablet Commonly known as: NORVASC Take 1 tablet (10 mg total) by mouth daily. Start taking on: July 14, 2023   Calcium Carb-Cholecalciferol 600-400 MG-UNIT Tabs Take 1 tablet by mouth 2 (two) times daily with a meal.   carbidopa-levodopa 25-100 MG tablet Commonly known as: SINEMET IR Take 2 tablets by mouth 3 (three) times daily.   cyanocobalamin 1000 MCG tablet Commonly known as: VITAMIN B12 Take 1,000 mcg by mouth daily.   DULoxetine 60 MG capsule Commonly known as: CYMBALTA Take 60 mg by mouth 2 (two) times daily.   feeding supplement Liqd Take 237 mLs by mouth 2 (two) times daily between meals.   gabapentin 300 MG capsule Commonly known as: NEURONTIN Take 1 capsule (300 mg total) by mouth 4 (four) times daily.   HYDROcodone-acetaminophen 5-325 MG tablet Commonly known as: NORCO/VICODIN Take 1 tablet by mouth every 6 (six) hours as needed for up to 5 days.   ipratropium 0.03 % nasal spray Commonly known as: ATROVENT Place 2 sprays into both nostrils 2 (two) times daily.   levothyroxine 50 MCG tablet Commonly known as: SYNTHROID Take 50 mcg by mouth daily before breakfast.   Magnesium 400 MG Caps Take 400 mg by mouth daily.   methocarbamol 500 MG tablet Commonly known as: ROBAXIN Take 1 tablet (500 mg total) by mouth every 6 (six) hours as needed for muscle spasms (Back pain).   metoprolol tartrate 25 MG tablet Commonly known as: LOPRESSOR Take 1 tablet (25 mg total) by mouth 2 (two) times daily. What changed:  medication strength how much to take additional instructions   Multiple Vitamins tablet Take 0.5 tablets by mouth daily. Take 1/2 tablet daily in the afternoon   omeprazole 20 MG capsule Commonly known as: PRILOSEC Take 20 mg by mouth daily.   ondansetron 4 MG disintegrating tablet Commonly known as:  ZOFRAN-ODT Take 4 mg by mouth every 8 (eight) hours as needed.   polyethylene glycol 17 g packet Commonly known as: MIRALAX / GLYCOLAX Take 17 g by mouth daily as needed for mild constipation.   PSORIASIS/ECZEMA RELIEF EX Apply 1 application topically daily as needed (eczema).   warfarin 1 MG tablet Commonly known as: COUMADIN Take 3 tablets (3 mg total) by mouth daily at 4 PM. Monday and Friday Start taking on: July 21, 2023 What changed:  when to take this These instructions start on July 21, 2023. If you are unsure what to do until then, ask your doctor or other care provider.   warfarin 6 MG tablet Commonly known as: COUMADIN Take 1 tablet (6 mg total) by mouth daily at 4 PM. Tuesday, Wednesday, Thursday, Saturday and Sunday Start taking on: July 22, 2023 What changed:  additional instructions These instructions start on July 22, 2023. If you are unsure what to do until then, ask your doctor or other care provider.        Contact information for follow-up providers     Drake Leach, PA-C Follow up on 07/19/2023.   Specialty: Neurosurgery Why: 1:30pm Contact information: [address]  [place] [address] [phone]              Contact information for after-discharge care     Destination     [facility] .   Service: Skilled Nursing Contact information: [address] [place] [address] [phone]                    Discharge Exam: Ceasar Mons Weights   06/29/23 1017 07/07/23 0902  Weight: 106 kg 99.8 kg   Physical Exam HENT:     Head: Normocephalic.     Mouth/Throat:     Pharynx: No oropharyngeal exudate.  Eyes:     General: Lids are normal.     Conjunctiva/sclera: Conjunctivae normal.  Cardiovascular:     Rate and Rhythm: Normal rate and regular rhythm.     Heart sounds: Normal heart sounds, S1 normal and S2 normal.  Pulmonary:     Breath sounds: No  decreased breath sounds, wheezing, rhonchi or rales.  Abdominal:     Palpations: Abdomen is soft.     Tenderness: There is no abdominal tenderness.  Musculoskeletal:     Right lower leg: No swelling.     Left lower leg: No swelling.  Skin:    General: Skin is warm.     Findings: No rash.  Neurological:     Mental Status: She is alert and oriented to person, place, and time.     Comments: Able to straight leg raise bilaterally.      Condition at discharge: stable  The results of significant diagnostics from this hospitalization (including imaging, microbiology, ancillary and laboratory) are listed below for reference.   Imaging Studies: DG Thoracic Spine 2 View  Result Date: 07/07/2023 CLINICAL DATA:  Thoracic fusion EXAM: THORACIC SPINE 2 VIEWS COMPARISON:  07/05/2023 FINDINGS: A single C-arm fluoroscopic image was obtained intraoperatively and submitted for post operative interpretation. Image demonstrates posterior fixation hardware within the lower thoracic spine. 1 minutes and 24 seconds of fluoroscopy time utilized. Radiation dose: 166.49 mGy. Please see the performing provider's procedural report for further detail. IMPRESSION: Intraoperative fluoroscopic guidance for thoracic fusion. Electronically Signed   By: Duanne Guess D.O.   On: 07/07/2023 17:36   DG C-Arm 1-60 Min-No Report  Result Date: 07/07/2023 Fluoroscopy was utilized by the requesting physician.  No radiographic interpretation.   MR THORACIC SPINE WO CONTRAST  Addendum Date: 07/06/2023   ADDENDUM REPORT: 07/06/2023 08:11 ADDENDUM: The thoracic vertebral body levels were incorrect on the initial report. When counting from above beginning at C2, the severe spinal canal stenosis in the thoracic spine is at T10-T11, not at T9-T10 as initially reported. Note that the patient has only 11 rib-bearing vertebral bodies, as seen on prior CT chest. Using this convention, there is sacralization of the L5 vertebral body  with a non-rib bearing T12 vertebral body. This numbering convention differs from the recent lumbar spine imaging. This was conveyed via Epic message on 07/06/2023 at 8:10 am. Electronically Signed   By: Lesia Hausen M.D.   On: 07/06/2023 08:11   Result Date: 07/06/2023 CLINICAL DATA:  Spinal stenosis with neurologic changes. EXAM: MRI THORACIC SPINE WITHOUT CONTRAST TECHNIQUE: Multiplanar, multisequence MR imaging of the thoracic spine was performed. No intravenous contrast was administered. COMPARISON:  CTA chest 06/19/2021 FINDINGS: Alignment: There is exaggerated thoracic kyphosis. There is trace anterolisthesis of C7 on T1 and trace retrolisthesis of T9 on T10. Vertebrae: Vertebral body heights are preserved. Background marrow signal is normal. There  is no suspicious marrow signal abnormality or marrow edema. Cord: There is cord compression at C3-C4 (incompletely evaluated), and T9-T10. There is no definite cord signal abnormality at T9-T10. Cord signal at C3-C4 is not evaluated. Paraspinal and other soft tissues: A left renal cyst is noted requiring no specific imaging follow-up. The paraspinal soft tissues are unremarkable. Disc levels: The cervical spine is imaged only with the sagittal T1 count sequence. There is severe spinal canal stenosis with cord compression at C3-C4 due to a posterior disc osteophyte complex and facet arthropathy. There is no other evidence of high-grade spinal canal stenosis in the cervical spine. There is multilevel disc desiccation and narrowing in the mid and lower thoracic spine, most advanced at T7-T8 and T8-T9. There are small disc protrusions at C7-T1 and C5-C6, and mild disc bulges at T6-T7 through T8-T9 without significant spinal canal stenosis. There is overall mild facet arthropathy at T8-T9 and above resulting in up to mild-to-moderate bilateral neural foraminal stenosis at T8-T9. At T9-T10, there is trace retrolisthesis with disc bulge and moderate facet arthropathy  with ligamentum flavum thickening resulting in severe spinal canal stenosis with cord compression, and severe bilateral neural foraminal stenosis. IMPRESSION: 1. The cervical spine is included on the sagittal T1 count sequence. There is severe spinal canal stenosis with cord compression at C3-C4 due to a posterior disc osteophyte complex and facet arthropathy. Cord signal abnormality can not be assessed at this level. Consider dedicated cervical spine MRI as indicated. 2. Severe spinal canal stenosis and severe bilateral neural foraminal stenosis at T9-T10 due to grade 1 retrolisthesis, a disc bulge, and moderate facet arthropathy without definite cord signal abnormality. 3. Overall mild degenerative changes at the other levels without other high-grade spinal canal or neural foraminal stenosis. Electronically Signed: By: Lesia Hausen M.D. On: 07/05/2023 19:35   MR LUMBAR SPINE WO CONTRAST  Result Date: 06/29/2023 CLINICAL DATA:  Myelopathy, acute, lumbar spine. Worsening back pain. EXAM: MRI LUMBAR SPINE WITHOUT CONTRAST TECHNIQUE: Multiplanar, multisequence MR imaging of the lumbar spine was performed. No intravenous contrast was administered. COMPARISON:  Lumbar spine CT 06/21/2023. FINDINGS: Segmentation: Conventional numbering is assumed with 5 non-rib-bearing, lumbar type vertebral bodies. Rudimentary disc at S1-2. Alignment:  Normal. Vertebrae: Modic type 2 degenerative endplate marrow signal changes at L3-4. Conus medullaris and cauda equina: Conus extends to the L1-2 level. Conus and cauda equina appear normal. Paraspinal and other soft tissues: Moderate fatty atrophy of the paraspinal muscles. Disc levels: T11-12: Sagittal images only. Disc bulge and facet arthropathy results in moderate spinal canal stenosis and moderate bilateral neural foraminal narrowing. T12-L1:  Moderate bilateral facet arthropathy. L1-L2: Disc bulge and moderate bilateral facet arthropathy results in mild spinal canal stenosis,  severe left and moderate right neural foraminal narrowing. L2-L3: Disc bulge and severe bilateral facet arthropathy results in moderate spinal canal stenosis and moderate bilateral neural foraminal narrowing. L3-L4: Endplate osteophytes and facet arthropathy results in severe spinal canal stenosis and moderate bilateral neural foraminal narrowing. L4-L5: No disc herniation or spinal canal stenosis. Severe bilateral facet arthropathy results in moderate bilateral neural foraminal narrowing. L5-S1: No disc herniation or spinal canal stenosis. Facet arthropathy results in moderate bilateral neural foraminal narrowing. IMPRESSION: 1. Multilevel lumbar spondylosis, worst at L3-4, where there is severe spinal canal stenosis and moderate bilateral neural foraminal narrowing. 2. Moderate spinal canal stenosis at T11-12 and L2-3. 3. Moderate to severe neural foraminal narrowing at multiple additional levels, as detailed above. Electronically Signed   By: Elwyn Reach.D.  On: 06/29/2023 16:12   CT L-SPINE NO CHARGE  Result Date: 06/29/2023 CLINICAL DATA:  72 year old female with back pain, increasing. Recurrent UTI. EXAM: CT LUMBAR SPINE WITHOUT CONTRAST TECHNIQUE: Multidetector CT imaging of the lumbar spine was performed without intravenous contrast administration. Multiplanar CT image reconstructions were also generated. RADIATION DOSE REDUCTION: This exam was performed according to the departmental dose-optimization program which includes automated exposure control, adjustment of the mA and/or kV according to patient size and/or use of iterative reconstruction technique. COMPARISON:  CT Abdomen and Pelvis 06/21/2023 and earlier. FINDINGS: Segmentation: Normal. Alignment: Stable lumbar lordosis. No significant scoliosis or spondylolisthesis. Vertebrae: Diffuse idiopathic skeletal hyperostosis (DISH). Flowing lower thoracic endplate osteophytes result interbody in ankylosis through the T11 level. Degenerative  appearing bilateral facet ankylosis superimposed on bulky facet and endplate hypertrophy results in ankylosis from L4 through to the sacrum. Maintained vertebral body height and No acute osseous abnormality identified. Pronounced degenerative SI joint vacuum phenomena, but visible sacrum and SI joints appear intact. Paraspinal and other soft tissues: Stable from the recent CT Abdomen and Pelvis, including bilateral nephrolithiasis but no hydronephrosis or hydroureter. Disc levels: Lower thoracic and lumbar spine degeneration superimposed on Diffuse idiopathic skeletal hyperostosis. Most notable levels include: T11-T12: Vacuum disc. Circumferential disc osteophyte complex and mild to moderate facet hypertrophy. At least mild spinal stenosis suspected here. L2-L3: Vacuum disc with bulky circumferential disc osteophyte complex, severe facet and ligament flavum hypertrophy. Evidence of severe spinal and moderate to severe bilateral L2 foraminal stenosis. L3-L4: Vacuum disc with severe disc space loss. Bulky circumferential disc osteophyte complex. Severe bilateral facet hypertrophy, evidence of developing facet ankylosis (series 9, image 71). Severe spinal and moderate to severe bilateral L3 foraminal stenosis. The levels below L3 are ankylosed. IMPRESSION: 1. No acute osseous abnormality identified. Diffuse idiopathic skeletal hyperostosis (DISH) with severe superimposed lumbar facet arthropathy, multilevel advanced disc degeneration. 2. Subsequent Ankylosis from the visible lower thoracic levels through T11, and L4 through the sacrum. Evidence of developing ankylosis also at L3-L4. 3. Severe multifactorial lumbar spinal stenosis at both L2-L3 and L3-L4, with up to severe biforaminal stenosis at both levels. And suspect significant lower thoracic spinal stenosis at T11-T12. Electronically Signed   By: Odessa Fleming M.D.   On: 06/29/2023 11:30   CT RENAL STONE STUDY  Result Date: 06/29/2023 CLINICAL DATA:  Dish urea.   Right lower quadrant abdominal pain. EXAM: CT ABDOMEN AND PELVIS WITHOUT CONTRAST TECHNIQUE: Multidetector CT imaging of the abdomen and pelvis was performed following the standard protocol without IV contrast. RADIATION DOSE REDUCTION: This exam was performed according to the departmental dose-optimization program which includes automated exposure control, adjustment of the mA and/or kV according to patient size and/or use of iterative reconstruction technique. COMPARISON:  01/12/2023 FINDINGS: Lower chest: No acute abnormality. Hepatobiliary: No focal liver abnormality is seen. Status post cholecystectomy. No biliary dilatation. Pancreas: Unremarkable. No pancreatic ductal dilatation or surrounding inflammatory changes. Spleen: Normal in size without focal abnormality. Adrenals/Urinary Tract: Normal adrenal glands. Bilateral kidney stones. Inferior pole right kidney stone measures 5 mm. Left kidney stone measures 3 mm. Bilateral simple appearing kidney cysts. The largest is off the inferior pole of the right kidney measuring 4.7 cm. No follow-up imaging recommended. No hydronephrosis identified bilaterally. No signs of hydroureter or ureteral calculi. Bladder appears normal for degree of distension. Stomach/Bowel: Postoperative changes from previous gastric bypass surgery. The appendix is visualized and appears normal, image 49/3. Signs of scratch there is no pathologic dilatation of the large or small  bowel loops to indicate a bowel obstruction. Distal colonic diverticula noted without signs of acute diverticulitis. Vascular/Lymphatic: Aortic atherosclerosis. No signs of abdominopelvic adenopathy. Reproductive: Status post hysterectomy. No adnexal masses. Other: No free fluid or fluid collections. No signs of pneumoperitoneum. Musculoskeletal: Multilevel thoracolumbar spondylosis. No acute or suspicious osseous findings. IMPRESSION: 1. No acute findings within the abdomen or pelvis. 2. Bilateral nonobstructing  kidney stones. 3. Distal colonic diverticulosis without signs of acute diverticulitis. 4.  Aortic Atherosclerosis (ICD10-I70.0). Electronically Signed   By: Signa Kell M.D.   On: 06/29/2023 10:30    Microbiology: Results for orders placed or performed during the hospital encounter of 06/29/23  Urine Culture     Status: None   Collection Time: 06/29/23 10:23 AM   Specimen: Urine, Catheterized  Result Value Ref Range Status   Specimen Description   Final    URINE, CATHETERIZED Performed at [facility], [address], [place], [place] [address]    Special Requests   Final    NONE Performed at [facility], [address] [place], [place] [address]    Culture   Final    NO GROWTH Performed at [facility], [address], [place], [place] [address]    Report Status 06/30/2023 FINAL  Final    Labs: CBC: Recent Labs  Lab 07/08/23 0520 07/12/23 0353  WBC 14.1* 7.5  HGB 12.5 10.7*  HCT 36.0 31.4*  MCV 89.8 92.9  PLT 244 274   Basic Metabolic Panel: Recent Labs  Lab 07/07/23 0726 07/08/23 0520 07/09/23 0458  NA 139 136 135  K 4.4 4.9 4.3  CL 101 99 97*  CO2 30 28 29   GLUCOSE 87 122* 84  BUN 24* 27* 44*  CREATININE 0.63 0.68 0.86  CALCIUM 8.9 8.5* 8.2*  MG  --  1.9  --    Liver Function Tests: No results for input(s): "AST", "ALT", "ALKPHOS", "BILITOT", "PROT", "ALBUMIN" in the last 168 hours. CBG: Recent Labs  Lab 07/07/23 1441  GLUCAP 145*    Discharge time spent: greater than 30 minutes.  Signed: Alford Highland, MD [facility] 07/13/2023

## 2023-07-13 NOTE — Progress Notes (Signed)
Multiple attempts throughout the day made to give report to [facility] with no telephone answer.

## 2023-07-13 NOTE — TOC Progression Note (Addendum)
Transition of Care [facility]) - Progression Note    Patient Details  Name: Suzanne Dixon MRN: [id number] Date of Birth: [birth year]  Transition of Care [facility]) CM/SW Contact  Margarito Liner, LCSW Phone Number: 07/13/2023, 8:40 AM  Clinical Narrative: SNF insurance authorization still pending.    9:23 am: Auth approved: [id number]. Valid 10/24-10/30. SNF is aware.  11:00 am: Updated patient.  Expected Discharge Plan and Services                                               Social Determinants of Health (SDOH) Interventions SDOH Screenings   Food Insecurity: Food Insecurity Present (06/29/2023)  Housing: Low Risk  (06/29/2023)  Transportation Needs: No Transportation Needs (06/29/2023)  Recent Concern: Transportation Needs - Unmet Transportation Needs (05/02/2023)   Received from [facility]  Utilities: Not At Risk (06/29/2023)  Financial Resource Strain: Low Risk  (06/09/2023)   Received from [facility]  Recent Concern: Financial Resource Strain - High Risk (05/02/2023)   Received from [facility]  Physical Activity: Insufficiently Active (05/02/2023)   Received from [facility]  Social Connections: Moderately Isolated (05/02/2023)   Received from [facility]  Stress: Stress Concern Present (05/02/2023)   Received from [facility]  Tobacco Use: Low Risk  (06/29/2023)  Health Literacy: Inadequate Health Literacy (05/02/2023)   Received from [facility]    Readmission Risk Interventions    06/21/2021    4:12 PM  Readmission Risk Prevention Plan  Transportation Screening Complete  PCP or Specialist Appt within 3-5 Days Complete  HRI or Home Care Consult Complete  Social Work Consult for Recovery Care Planning/Counseling Complete  Palliative Care Screening Not Applicable  Medication Review Oceanographer) Complete

## 2023-07-13 NOTE — Telephone Encounter (Signed)
Left message for pt's daughter Rene Kocher.

## 2023-07-14 NOTE — Telephone Encounter (Signed)
I spoke to [place] and informed her that patient is not to start Coumadin until 07/21/2023 and she should continue with the prescribed medication and dosage. She states she was wanting to see about changing this medication. I informed her that we are not the prescribing provider and she will need to reach out to the prescriber (Dr. Renae Gloss) that ordered the medication. Anisa voiced understanding.

## 2023-07-16 NOTE — Progress Notes (Unsigned)
   REFERRING PHYSICIAN:  Lynnea Ferrier, Md [address] [facility] [place],  [place] [address]  DOS: 07/07/23  Transpedicular decompression and fusion T10-T11  HISTORY OF PRESENT ILLNESS: Suzanne Dixon is approximately 2 weeks status post above surgery. Was given norco 5 and robaxin on discharge from the hospital. She is on COUMADIN.   Was discharged to SNF after her surgery.   She is doing well with minimal pain. She notes some "leg jumping/twitching" at night. No leg pain. Feels like her legs are stronger and she is moving them better.   She is taking tylenol, neurontin, and prn robaxin.   PHYSICAL EXAMINATION:  General: Patient is well developed, well nourished, calm, collected, and in no apparent distress.   NEUROLOGICAL:  General: In no acute distress.   Awake, alert, oriented to person, place, and time.  Pupils equal round and reactive to light.  Facial tone is symmetric.     Strength:           Side Iliopsoas Quads Hamstring PF DF EHL  R 5 4+ 4+ 5 5 5   L 5 5 5 5 5 5    Incision c/d/i   ROS (Neurologic):  Negative except as noted above  IMAGING: Nothing new to review.   ASSESSMENT/PLAN:  DEEANNE ARTURO is doing well s/p above surgery. Treatment options reviewed with patient and following plan made:   - I have advised the patient to lift up to 10 pounds until 6 weeks after surgery (follow up with Dr. Katrinka Blazing).  - Reviewed wound care. Okay to leave incision open to air. Okay to shower and get incision wet. Do not submerge  - No bending, twisting, or lifting.  - Continue on current medications including prn tylenol and prn robaxin.  - Follow up as scheduled in 4 weeks and prn. Will need to go [facility] for x[facility] 1 and 1/2 hours prior to this appointment.   Advised to contact the office if any questions or concerns arise.  Drake Leach PA-C Department of neurosurgery

## 2023-07-17 NOTE — Anesthesia Postprocedure Evaluation (Signed)
Anesthesia Post Note  Patient: Suzanne Dixon  Procedure(s) Performed: T10-T11 Laminectomy, transpedicular Decompression, T10-11 posterior spinal fusion (Spine Thoracic) APPLICATION OF INTRAOPERATIVE CT SCAN  Patient location during evaluation: PACU Anesthesia Type: General Level of consciousness: awake and alert Pain management: pain level controlled Vital Signs Assessment: post-procedure vital signs reviewed and stable Respiratory status: spontaneous breathing, nonlabored ventilation, respiratory function stable and patient connected to nasal cannula oxygen Cardiovascular status: blood pressure returned to baseline and stable Postop Assessment: no apparent nausea or vomiting Anesthetic complications: no   No notable events documented.   Last Vitals:  Vitals:   07/13/23 0754 07/13/23 1644  BP: 122/80 95/61  Pulse: 86 92  Resp: 18 18  Temp: 36.6 C (!) 36.4 C  SpO2: 99% 99%    Last Pain:  Vitals:   07/13/23 0902  TempSrc:   PainSc: 0-No pain                 Yevette Edwards

## 2023-07-19 ENCOUNTER — Ambulatory Visit (INDEPENDENT_AMBULATORY_CARE_PROVIDER_SITE_OTHER): Payer: Medicare HMO | Admitting: Orthopedic Surgery

## 2023-07-19 ENCOUNTER — Encounter: Payer: Self-pay | Admitting: Orthopedic Surgery

## 2023-07-19 VITALS — BP 118/78 | Temp 97.7°F

## 2023-07-19 DIAGNOSIS — M5415 Radiculopathy, thoracolumbar region: Secondary | ICD-10-CM

## 2023-07-19 DIAGNOSIS — M4714 Other spondylosis with myelopathy, thoracic region: Secondary | ICD-10-CM

## 2023-07-19 DIAGNOSIS — M4805 Spinal stenosis, thoracolumbar region: Secondary | ICD-10-CM | POA: Diagnosis not present

## 2023-07-19 DIAGNOSIS — Z981 Arthrodesis status: Secondary | ICD-10-CM

## 2023-08-01 ENCOUNTER — Telehealth: Payer: Self-pay

## 2023-08-14 ENCOUNTER — Telehealth: Payer: Self-pay

## 2023-08-16 ENCOUNTER — Ambulatory Visit
Admission: RE | Admit: 2023-08-16 | Discharge: 2023-08-16 | Disposition: A | Discharge status: Home / Self Care | Payer: Medicare HMO | Attending: Neurosurgery | Admitting: Neurosurgery

## 2023-08-16 ENCOUNTER — Other Ambulatory Visit: Payer: Self-pay

## 2023-08-16 ENCOUNTER — Ambulatory Visit: Payer: Medicare HMO | Admitting: Neurosurgery

## 2023-08-16 ENCOUNTER — Encounter: Payer: Self-pay | Admitting: Neurosurgery

## 2023-08-16 ENCOUNTER — Ambulatory Visit
Admission: RE | Admit: 2023-08-16 | Discharge: 2023-08-16 | Disposition: A | Discharge status: Home / Self Care | Payer: Medicare HMO | Source: Ambulatory Visit | Attending: Neurosurgery | Admitting: Neurosurgery

## 2023-08-16 VITALS — BP 112/72 | Temp 97.8°F | Ht 64.0 in | Wt 220.0 lb

## 2023-08-16 DIAGNOSIS — Z981 Arthrodesis status: Secondary | ICD-10-CM

## 2023-08-16 DIAGNOSIS — M5415 Radiculopathy, thoracolumbar region: Secondary | ICD-10-CM

## 2023-08-16 DIAGNOSIS — M4805 Spinal stenosis, thoracolumbar region: Secondary | ICD-10-CM

## 2023-08-16 DIAGNOSIS — M4714 Other spondylosis with myelopathy, thoracic region: Secondary | ICD-10-CM

## 2023-08-16 DIAGNOSIS — Z09 Encounter for follow-up examination after completed treatment for conditions other than malignant neoplasm: Secondary | ICD-10-CM

## 2023-08-16 NOTE — Progress Notes (Signed)
   REFERRING PHYSICIAN:  Lynnea Ferrier, Md [address] [facility] [place],  [place] [address]  DOS: 07/07/23  Transpedicular decompression and fusion T10-T11  HISTORY OF PRESENT ILLNESS: Suzanne Dixon is approximately 6 weeks status post above surgery. Was given norco 5 and robaxin on discharge from the hospital. She is on COUMADIN.   She is recovering in her nursing facility.  Continues to have care with wound care as well as physical therapy.  She does notice some jumping and twitching of her lower extremities.  Overall doing better.  Feels like her strength is improving.  PHYSICAL EXAMINATION:  General: Patient is well developed, well nourished, calm, collected, and in no apparent distress.   NEUROLOGICAL:  General: In no acute distress.   Awake, alert, oriented to person, place, and time.  Pupils equal round and reactive to light.  Facial tone is symmetric.     Strength:           Side Iliopsoas Quads Hamstring PF DF EHL  R 5 4+ 4+ 5 5 5   L 5 5 5 5 5 5    Incision c/d/i   ROS (Neurologic):  Negative except as noted above  IMAGING: Nothing new to review.   ASSESSMENT/PLAN:  Suzanne Dixon is doing well s/p above surgery. Treatment options reviewed with patient and following plan made:   - I have advised the patient to lift up to25pounds until next follow-up - Reviewed wound care.  Currently working with wound care nursing, would recommend a high-protein diet to help with her wound healing. - No bending, twisting, or lifting.  -Will continue to have her follow-up with [place] as previously scheduled -We do feel like she would benefit from a spinal cord injury physiatry referral to help with what sounds like spasmodic contractions of her lower extremities Advised to contact the office if any questions or concerns arise.  Lovenia Kim, MD Department of neurosurgery

## 2023-08-16 NOTE — Patient Instructions (Signed)
It was a pleasure seeing you today at [facility] neurosurgery in [place].  You appear to be continuing to improve with your function.  Currently under the care of a wound care nurse, in order to help facilitate healing we agree that you should be on a high-protein diet to help during your time of recovery.  Will also reach out to your neurology team to let them know that your tremor is now starting to develop on the left side.  They should be reaching out to you shortly.  Consider referral to physiatry if for spinal cord injury management, developing some intermittent spastic contractures in the bilateral lower extremities.

## 2023-09-04 ENCOUNTER — Telehealth: Payer: Self-pay | Admitting: Neurosurgery

## 2023-09-04 NOTE — Telephone Encounter (Signed)
Patient is calling. Last time she saw Dr.Smith she understood that he was going to add a nightly dosage of carbidopa levodopa to her already 2 tablets 3x aday dose. He was also going to reach out to Dr.Shah regarding their visit on 08/16/23.  She wants to know if Dr.Smith did talk to Dr.Shah and if he sent in a new for carbidopa levodopa at night?

## 2023-09-05 NOTE — Telephone Encounter (Signed)
LMOM informing patient per Dr. Katrinka Blazing "I don't think that I would have offered to titrate her parkinsons medications. That is outside of my scope." I notified her to contact the provider that prescribes the medication to have it titrated.

## 2023-09-06 NOTE — Telephone Encounter (Signed)
Patient called back, I read her Dr.Smith's response. She will contact Dr.Shah.

## 2023-09-22 ENCOUNTER — Other Ambulatory Visit: Payer: Self-pay | Admitting: Orthopedic Surgery

## 2023-09-22 DIAGNOSIS — M4714 Other spondylosis with myelopathy, thoracic region: Secondary | ICD-10-CM

## 2023-09-22 DIAGNOSIS — Z981 Arthrodesis status: Secondary | ICD-10-CM

## 2023-09-22 NOTE — Progress Notes (Signed)
   REFERRING PHYSICIAN:  [name], Md [address] [facility] [place],  [place] [address]  DOS: 07/07/23  Transpedicular decompression and fusion T10-T11  HISTORY OF PRESENT ILLNESS:  She was improving at her last visit. She was to start high protein diet to help with wound healing. He also recommended referral to physiatry for spinal cord injury to help with spasms in her lower extremities.   She is doing well. She has intermittent LBP and right groin pain. She still has intermittent spasms in both legs.   She is at home. Getting ready to restart PT.   She is on COUMADIN .   PHYSICAL EXAMINATION:  General: Patient is well developed, well nourished, calm, collected, and in no apparent distress.   NEUROLOGICAL:  General: In no acute distress.   Awake, alert, oriented to person, place, and time.  Pupils equal round and reactive to light.  Facial tone is symmetric.     Strength:           Side Iliopsoas Quads Hamstring PF DF EHL  R 5 4+ 4+ 5 5 5   L 5 5 5 5 5 5    Incision well healed   ROS (Neurologic):  Negative except as noted above  IMAGING: Thoracic xrays dated 09/27/23:  No complications noted.  Report for above xrays not available. Xrays reviewed with Dr. Claudene.    ASSESSMENT/PLAN:  Suzanne Dixon is doing reasonably well s/p above surgery. Treatment options reviewed with patient and following plan made:   - Referral to PMR to help with spasms in her lower extremities with h/o spinal cord injury. Referral to Duwaine Furnish at [facility].  - She can slowly return to activity as tolerated. Would still be careful with lifting.  - Okay to continue with PT.  - Message to PCP regarding possible psychiatry referral at her request. She has been depressed but is feeling better. Wants to talk to someone. No SI or HI.  - Follow up with Dr. Claudene in 6 months with repeat xrays.   Advised to contact the office if any questions or concerns arise.  Glade Boys  PA-C Department of neurosurgery

## 2023-09-27 ENCOUNTER — Encounter: Payer: Self-pay | Admitting: Orthopedic Surgery

## 2023-09-27 ENCOUNTER — Ambulatory Visit
Admission: RE | Admit: 2023-09-27 | Discharge: 2023-09-27 | Disposition: A | Discharge status: Home / Self Care | Payer: Medicare HMO | Source: Ambulatory Visit | Attending: Orthopedic Surgery | Admitting: Orthopedic Surgery

## 2023-09-27 ENCOUNTER — Ambulatory Visit: Payer: Medicare HMO | Admitting: Orthopedic Surgery

## 2023-09-27 VITALS — BP 120/82 | Ht 64.0 in | Wt 220.0 lb

## 2023-09-27 DIAGNOSIS — Z981 Arthrodesis status: Secondary | ICD-10-CM | POA: Insufficient documentation

## 2023-09-27 DIAGNOSIS — M4714 Other spondylosis with myelopathy, thoracic region: Secondary | ICD-10-CM | POA: Insufficient documentation

## 2023-09-27 NOTE — Patient Instructions (Signed)
 It was nice to see you today.   Dr. Claudene wants to you see Dr. Duwaine Furnish regarding the spasms in your legs. I have put in the referral and they should call you. See contact information below.   I will send a message to Dr. Fernande about seeing someone to talk to- you should hear back from his office.   We will see you back in 6 months. You will need xrays prior to that visit.    Please call with any questions or concerns.   Glade Boys PA-C [phone]     The physicians and staff at [facility]surgery at [facility] are committed to providing excellent care. You may receive a survey asking for feedback about your experience at our office. We value you your feedback and appreciate you taking the time to to fill it out. The [facility] leadership team is also available to discuss your experience in person, feel free to contact us  [phone].

## 2023-10-02 ENCOUNTER — Encounter: Payer: Self-pay | Admitting: Physical Medicine and Rehabilitation

## 2023-10-20 ENCOUNTER — Other Ambulatory Visit: Payer: Self-pay | Admitting: Internal Medicine

## 2023-10-20 DIAGNOSIS — Z1231 Encounter for screening mammogram for malignant neoplasm of breast: Secondary | ICD-10-CM

## 2023-10-25 ENCOUNTER — Other Ambulatory Visit: Payer: Self-pay | Admitting: Internal Medicine

## 2023-10-25 ENCOUNTER — Ambulatory Visit
Admission: RE | Admit: 2023-10-25 | Discharge: 2023-10-25 | Disposition: A | Discharge status: Home / Self Care | Payer: Medicare HMO | Source: Ambulatory Visit | Attending: Internal Medicine | Admitting: Internal Medicine

## 2023-10-25 DIAGNOSIS — Z1231 Encounter for screening mammogram for malignant neoplasm of breast: Secondary | ICD-10-CM | POA: Diagnosis present

## 2023-12-13 ENCOUNTER — Encounter: Payer: Self-pay | Admitting: Physical Medicine and Rehabilitation

## 2023-12-13 ENCOUNTER — Encounter: Payer: Medicare HMO | Attending: Physical Medicine and Rehabilitation | Admitting: Physical Medicine and Rehabilitation

## 2023-12-13 VITALS — BP 109/74 | HR 78 | Ht 64.0 in | Wt 220.0 lb

## 2023-12-13 DIAGNOSIS — G8222 Paraplegia, incomplete: Secondary | ICD-10-CM | POA: Diagnosis not present

## 2023-12-13 DIAGNOSIS — M4714 Other spondylosis with myelopathy, thoracic region: Secondary | ICD-10-CM | POA: Diagnosis not present

## 2023-12-13 DIAGNOSIS — M4805 Spinal stenosis, thoracolumbar region: Secondary | ICD-10-CM | POA: Insufficient documentation

## 2023-12-13 DIAGNOSIS — M5415 Radiculopathy, thoracolumbar region: Secondary | ICD-10-CM | POA: Insufficient documentation

## 2023-12-13 MED ORDER — BACLOFEN 10 MG PO TABS: 5.0000 mg | ORAL_TABLET | Freq: Two times a day (BID) | ORAL | 5 refills | Status: DC

## 2023-12-13 NOTE — Progress Notes (Signed)
 Subjective:    Patient ID: Suzanne Dixon, female    DOB: 04/14/1951, 73 y.o.   MRN: [id number]  HPI Pt is a 73 yr old female with hx of pAfib- on warfarim, HTN, Parkinson's disease;  And spinal stenosis with Thoracic spondylosis- and myelopathy.  S/p thoracic fusion in 10/24.  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    Spasms- mainly when lays down at night- starts Draws up- and would finally release.  Sometimes takes med for it-  Robaxin-  Helps some- will "finally calm it down".  Takes 30 minutes at least- will ease off- sounds like spontaneously?   Occurs mostly at night- sometimes during day.  Used to be every night- now 4nights/week.  Takes Meloxicam 1x/day- and also on coumadin-  they know she's on Meloxicam as well.    Sitting in w/c- transport w/c- but has one herself.  Has RW-  at home Was getting along well until latest surgery- worse shape than was before surgery.  Has to hold onto something to walk now- was using RW some or cane prior to surgery- now HAS to use RW.  Usually uses RW, but also owns a Rolator- tends to get away from her now. .    Can mainly just walk around home.    Feels like pain adequately controlled most of time.  Hx of RTC injury and surgery x2 on R shoulder-  Larey Seat and had to fix 2nd time) when weather changes, hurts like crazy.   Pain is adequately controlled with current regimen.    Tried: robaxin- takes "forever" to kick in Never tried Baclofen  Is on gabapentin- for nerve pain- been on a long time-  Takes 300mg  QID- used to be on 900mg  QID.    Hx of 12/22- neck surgery- ACDF- doesn't know levels   Doing H/H- PT coming to the house- comes 1x/week- feels like needs more than that.   Pain Inventory Average Pain 5 Pain Right Now 5 My pain is intermittent, sharp, and tingling  In the last 24 hours, has pain interfered  with the following? General activity 4 Relation with others 2 Enjoyment of life 3 What TIME of day is your pain at its worst? night Sleep (in general) Good  Pain is worse with: walking and some activites Pain improves with: rest Relief from Meds: 6  use a walker ability to climb steps?  no do you drive?  no use a wheelchair needs help with transfers  disabled: date disabled 05/03/24 retired I need assistance with the following:  bathing, meal prep, household duties, and shopping  bladder control problems weakness numbness tremor tingling trouble walking spasms confusion  Any changes since last visit?  no  Any changes since last visit?  no    Family History  Problem Relation Age of Onset   Hypertension Mother    Lymphoma Mother    Cancer Sister        Breast   Breast cancer Sister 74   Social History   Socioeconomic History   Marital status: Widowed    Spouse name: Not on file   Number of children: Not on file   Years of education: Not on file   Highest education level: Not on file  Occupational History   Not on file  Tobacco Use   Smoking status: Never   Smokeless tobacco: Never  Vaping Use   Vaping status: Not on file  Substance and Sexual Activity   Alcohol use: No    Alcohol/week: 0.0 standard drinks of alcohol   Drug use: No   Sexual activity: Not on file  Other Topics Concern   Not on file  Social History Narrative   Not on file   Social Drivers of Health   Financial Resource Strain: Low Risk  (06/09/2023)   [facility]   Overall Financial Resource Strain (CARDIA)    Difficulty of Paying Living Expenses: Not hard at all  Recent Concern: Financial Resource Strain - High Risk (05/02/2023)   Received from [facility]   Overall Financial Resource Strain (CARDIA)    Difficulty of Paying Living Expenses: Hard  Food Insecurity: Food Insecurity Present (06/29/2023)   Hunger Vital Sign     Worried About Running Out of Food in the Last Year: Sometimes true    Ran Out of Food in the Last Year: Never true  Transportation Needs: No Transportation Needs (06/29/2023)   PRAPARE - Administrator, Civil Service (Medical): No    Lack of Transportation (Non-Medical): No  Recent Concern: Transportation Needs - Unmet Transportation Needs (05/02/2023)   Received from [facility] - Transportation    In the past 12 months, has lack of transportation kept you from medical appointments or from getting medications?: Yes    Lack of Transportation (Non-Medical): Yes  Physical Activity: Insufficiently Active (05/02/2023)   Received from [facility]   Exercise Vital Sign    Days of Exercise per Week: 1 day    Minutes of Exercise per Session: 30 min  Stress: Stress Concern Present (05/02/2023)   Received from [facility] - Occupational Stress Questionnaire    Feeling of Stress : To some extent  Social Connections: Moderately Isolated (05/02/2023)   Received from [facility]   Social Connection and Isolation Panel [NHANES]    Frequency of Communication with Friends and Family: Three times a week    Frequency of Social Gatherings with Friends and Family: Once a week    Attends Religious Services: More than 4 times per year    Active Member of [employer] or Organizations: No    Attends Banker Meetings: Never    Marital Status: Widowed   Past Surgical History:  Procedure Laterality Date   ABDOMINAL HYSTERECTOMY     APPLICATION OF INTRAOPERATIVE CT SCAN N/A 07/07/2023   Procedure: APPLICATION OF INTRAOPERATIVE CT SCAN;  Surgeon: Lovenia Kim, MD;  Location: [facility] ORS;  Service: Neurosurgery;  Laterality: N/A;   BACK SURGERY  2002   plate and 2 screws in neck   BREAST BIOPSY Right 1991,2014   Positive   BREAST CYST ASPIRATION Left    BREAST SURGERY  Right 2014   mastectomy   CATARACT EXTRACTION W/PHACO Left 05/17/2019   Procedure: CATARACT EXTRACTION PHACO AND INTRAOCULAR LENS PLACEMENT (IOC);  Surgeon: Nevada Crane, MD;  Location: [facility];  Service: Ophthalmology;  Laterality: Left;  [place] 00:28 CDE 2.15 FLUID PACK LOT # [id number]   CATARACT EXTRACTION W/PHACO Right 06/14/2019   Procedure: CATARACT EXTRACTION PHACO AND INTRAOCULAR LENS PLACEMENT (IOC) RIGHT;  Surgeon: Nevada Crane, MD;  Location: [facility];  Service: Ophthalmology;  Laterality: Right;  [place] 00:35.0 CDE 2.37 Fluid Pack Lot #[id number]   CESAREAN SECTION  CHOLECYSTECTOMY  04/09/2012   COLONOSCOPY WITH PROPOFOL N/A 06/22/2015   Procedure: COLONOSCOPY WITH PROPOFOL;  Surgeon: Scot Jun, MD;  Location: [facility] ENDOSCOPY;  Service: Endoscopy;  Laterality: N/A;   COLONOSCOPY WITH PROPOFOL N/A 05/25/2021   Procedure: COLONOSCOPY WITH PROPOFOL;  Surgeon: Regis Bill, MD;  Location: [facility] ENDOSCOPY;  Service: Endoscopy;  Laterality: N/A;   ESOPHAGOGASTRODUODENOSCOPY     ESOPHAGOGASTRODUODENOSCOPY N/A 05/25/2021   Procedure: ESOPHAGOGASTRODUODENOSCOPY (EGD);  Surgeon: Regis Bill, MD;  Location: [facility] ENDOSCOPY;  Service: Endoscopy;  Laterality: N/A;   EYE SURGERY     LAPAROSCOPIC GASTRIC BYPASS     MASTECTOMY Right    MASTECTOMY MODIFIED RADICAL     mastectomy partial      lumpectomy   NEUROPLASTY / TRANSPOSITION ULNAR NERVE AT ELBOW     OOPHORECTOMY     PORT-A-CATH REMOVAL Left 05/13/2015   Procedure: REMOVAL PORT-A-CATH LEFT CHEST ;  Surgeon: Lattie Haw, MD;  Location: [facility] ORS;  Service: General;  Laterality: Left;   REDUCTION MAMMAPLASTY Left    REVERSE SHOULDER ARTHROPLASTY Right 12/03/2020   Procedure: REVERSE SHOULDER ARTHROPLASTY;  Surgeon: Christena Flake, MD;  Location: [facility];  Service: Orthopedics;  Laterality: Right;   ROUX-EN-Y GASTRIC BYPASS  01/26/2009   SHOULDER ARTHROSCOPY WITH OPEN ROTATOR CUFF REPAIR Right 01/03/2017   Procedure:  SHOULDER ARTHROSCOPY WITH OPEN ROTATOR CUFF REPAIR;  Surgeon: Christena Flake, MD;  Location: [facility];  Service: Orthopedics;  Laterality: Right;   SHOULDER ARTHROSCOPY WITH SUBACROMIAL DECOMPRESSION, ROTATOR CUFF REPAIR AND BICEP TENDON REPAIR Right 01/03/2017   Procedure: SHOULDER ARTHROSCOPY WITH SUBACROMIAL DECOMPRESSION, ROTATOR CUFF REPAIR AND BICEP TENDON REPAIR;  Surgeon: Christena Flake, MD;  Location: [facility] OR[facility];  Service: Orthopedics;  Laterality: Right;  Limited debridement   SPINE SURGERY     Past Medical History:  Diagnosis Date   A-fib (HCC)    Anemia    B12 deficiency    Breast cancer (HCC) 03/31/2013   Right - chemo- mastectomy   Breast cancer (HCC) 1991   Rt.- radiation   Complication of anesthesia 01/2009   Recent years with general anesthesia had itching following surgery   DDD (degenerative disc disease), cervical    Depression    Diverticulitis    Dyspnea    Edema of both legs    GERD (gastroesophageal reflux disease)    Gout    H/O mastectomy 2014   per patient   H/O: cesarean section 1987   per patient report   H/O: hysterectomy 1996   per patient   Hydradenitis    Hypertension    Hypothyroidism    Neuropathy    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HCC)    Parkinson's disease (HCC)    Personal history of chemotherapy    Personal history of radiation therapy    Ruptured cervical disc 2002   per patient    Sleep apnea    cpap machine   Super obese    Tachycardia    BP 109/74   Pulse 78   Ht 5\' 4"  (1.626 m)   Wt 220 lb (99.8 kg) Comment: last recorded  SpO2 95%   BMI 37.76 kg/m   Opioid Risk Score:   Fall Risk Score:  `1  Depression screen [facility] 2/9     12/13/2023    9:46 AM  Depression screen PHQ 2/9  Decreased Interest 0  Down, Depressed, Hopeless 1  PHQ - 2 Score 1  Altered sleeping 0  Tired, decreased energy 1  Change in appetite 0  Feeling bad or failure about yourself  0  Trouble concentrating 0  Moving slowly or fidgety/restless 0  Suicidal  thoughts 0  PHQ-9 Score 2     Review of Systems  Constitutional:  Positive for chills and unexpected weight change.  Respiratory:  Positive for cough.   Cardiovascular:  Positive for leg swelling.  Gastrointestinal:  Positive for constipation.  Genitourinary:  Positive for dysuria.  Musculoskeletal:  Positive for back pain and gait problem.  Neurological:  Positive for tremors, weakness and numbness.  Hematological:  Bruises/bleeds easily.       Warfarin for atrial fib  Psychiatric/Behavioral:  Positive for confusion.   All other systems reviewed and are negative.     Objective:   Physical Exam Awake, alert, appropriate, accompanied by caregiver, in transport w/c; BMI 37, NAD MSK: Ue's:  throughout appears to be decreased- deltoids 4+/5 B/L; biceps 4+/5; triceps 4+/5; WE 4+/5 on R and 4/5 on L; grip 4/5 and FA 4-/5 B/L RLE- HF 4/5; KE 4+/5; and DF/PF 5-/5 LLE- same as RLE-   Neuro: Absent DTRs B/L in patella and achilles 2-3 beats clonus in LE's B/L However don't have any increased tone I think- hard ot assess with lymphedema B/L But did see a few spasms of RLE during exam No tremor seen on exam No hoffman's B/L      Assessment & Plan:   Pt is a 73 yr old female with hx of pAfib- on warfarim, HTN, Parkinson's disease?- Dr Katrinka Blazing didn't think was PD- ;  And spinal stenosis with Thoracic spondylosis- and myelopathy.  S/p thoracic fusion in 10/24.  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 Since 1/2 tab- up to 2x/day- and then can increase to 1 full tab/1 mg at night after 4 days if you need to.  And can take day dose as needed if having spasms.   2. Baclofen could help tremors of hands, but no guarantees.    3.  Needs to do home exercise program daily- to get in habit of doing strengthening exercises- do at least 15 minutes/day- so  put a reminder in your phone.    4.Coumadin and Meloxicam per [facility] clinic- I'm concerned that we don't usually put these meds together?   5. Bring RW to next appt so I can see you walk.    6. F/U in 3 months- but call me in 1 month to let me know how things doing- or send mychart message.    7. First hour of 1st dose be with someone when you take baclofen   I spent a total of 35   minutes on total care today- >50% coordination of care- due to  discussion about spasticity, therapies and need for meloxicam/Warfarin?

## 2023-12-13 NOTE — Patient Instructions (Addendum)
 Pt is a 73 yr old female with hx of pAfib- on warfarim, HTN, Parkinson's disease?- Dr Katrinka Blazing didn't think was PD- ;  And spinal stenosis with Thoracic spondylosis- and myelopathy.  S/p thoracic fusion in 10/24.  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 Since 1/2 tab- up to 2x/day- and then can increase to 1 full tab/1 mg at night after 4 days if you need to.  And can take day dose as needed if having spasms.   2. Baclofen could help tremors of hands, but no guarantees.    3.  Needs to do home exercise program daily- to get in habit of doing strengthening exercises- do at least 15 minutes/day- so put a reminder in your phone.    4.Coumadin and Meloxicam per [facility] clinic- I'm concerned that we don't usually put these meds together?   5. Bring RW to next appt so I can see you walk.    6. F/U in 3months- but call me in 1 month to let me know how things doing- or send mychart message.    7. First hour of 1st dose be with someone when you take baclofen   8. Once done with Home health- and will see if can do Outpt therapies.

## 2024-01-31 ENCOUNTER — Telehealth (INDEPENDENT_AMBULATORY_CARE_PROVIDER_SITE_OTHER): Payer: Self-pay

## 2024-01-31 NOTE — Telephone Encounter (Signed)
 Patient left a message stating that her legs are swollen and painful. Patient would like to be seen by Dr Vonna Guardian sooner than appointment scheduled. Patient was last seen 05/14/2023. I did reach out to the patient to get more information and left a message to return call to the office on her voicemail.

## 2024-02-01 NOTE — Telephone Encounter (Signed)
 She can be seen sooner by Dr. Vonna Guardian with available appointment.  Also please make sure patient is engaging in her conservative therapy as previously advised, including use of medical grade compression daily, elevation, activity and use of her lymphedema pump

## 2024-02-01 NOTE — Telephone Encounter (Signed)
 Patient has been notified with medical recommendations and verbalized understanding. Patient was informed that the front receptionist will contact her to schedule appointment.

## 2024-02-02 ENCOUNTER — Other Ambulatory Visit: Payer: Self-pay

## 2024-02-02 DIAGNOSIS — G63 Polyneuropathy in diseases classified elsewhere: Secondary | ICD-10-CM

## 2024-02-06 ENCOUNTER — Encounter (INDEPENDENT_AMBULATORY_CARE_PROVIDER_SITE_OTHER): Payer: Self-pay

## 2024-02-14 ENCOUNTER — Ambulatory Visit
Admission: RE | Admit: 2024-02-14 | Discharge: 2024-02-14 | Disposition: A | Discharge status: Home / Self Care | Source: Ambulatory Visit

## 2024-02-14 DIAGNOSIS — G629 Polyneuropathy, unspecified: Secondary | ICD-10-CM | POA: Diagnosis present

## 2024-02-14 DIAGNOSIS — G63 Polyneuropathy in diseases classified elsewhere: Secondary | ICD-10-CM

## 2024-02-14 DIAGNOSIS — G20A1 Parkinson's disease without dyskinesia, without mention of fluctuations: Secondary | ICD-10-CM | POA: Insufficient documentation

## 2024-02-23 ENCOUNTER — Encounter: Admitting: Physical Medicine and Rehabilitation

## 2024-02-26 ENCOUNTER — Ambulatory Visit (INDEPENDENT_AMBULATORY_CARE_PROVIDER_SITE_OTHER): Admitting: Vascular Surgery

## 2024-02-26 ENCOUNTER — Encounter (INDEPENDENT_AMBULATORY_CARE_PROVIDER_SITE_OTHER): Payer: Self-pay | Admitting: Vascular Surgery

## 2024-02-26 VITALS — BP 145/76 | HR 70 | Resp 18 | Ht 64.0 in | Wt 224.0 lb

## 2024-02-26 DIAGNOSIS — I890 Lymphedema, not elsewhere classified: Secondary | ICD-10-CM | POA: Diagnosis not present

## 2024-02-26 DIAGNOSIS — I480 Paroxysmal atrial fibrillation: Secondary | ICD-10-CM | POA: Diagnosis not present

## 2024-02-26 DIAGNOSIS — I10 Essential (primary) hypertension: Secondary | ICD-10-CM | POA: Diagnosis not present

## 2024-02-26 NOTE — Progress Notes (Signed)
 Subjective:    Patient ID: Lanning Place, female    DOB: [birth year], 73 y.o.   MRN: [id number] Chief Complaint  Patient presents with   Follow-up    1 year follow up/bilateral leg swelling/hurting    Patient returns today in follow up of her lymphedema.  Her lower leg swelling is significantly improved wearing her compression socks daily.  She still has a lot of thigh swelling and enlargement but this is much better than it was before.  No ulceration or infection.  No chest pain or shortness of breath.  She is not particularly mobile.  She does try to elevate her legs is much as possible.  She does wear her compression socks every day.   Review of Systems  Cardiovascular:        Positive shortness of breath with exertion  Musculoskeletal:  Positive for arthralgias and joint swelling.       Positive swelling to bilateral lower extremities with lymphedema.  History of arthritis with joint pain.  Neurological:        History of CVA       Objective:    Physical Exam Vitals reviewed.  Constitutional:      Appearance: Normal appearance. She is obese.  HENT:     Head: Normocephalic.  Eyes:     Pupils: Pupils are equal, round, and reactive to light.  Cardiovascular:     Rate and Rhythm: Normal rate. Rhythm irregular.     Pulses: Normal pulses.     Heart sounds: Normal heart sounds.  Pulmonary:     Effort: Pulmonary effort is normal.     Breath sounds: Normal breath sounds.  Abdominal:     General: Abdomen is flat. Bowel sounds are normal.     Palpations: Abdomen is soft.  Musculoskeletal:        General: Swelling present.     Cervical back: Normal range of motion.     Right lower leg: Edema present.     Left lower leg: Edema present.  Skin:    General: Skin is warm and dry.     Capillary Refill: Capillary refill takes 2 to 3 seconds.  Neurological:     General: No focal deficit present.     Mental Status: She is alert and oriented to person, place, and time. Mental  status is at baseline.  Psychiatric:        Mood and Affect: Mood normal.        Behavior: Behavior normal.        Thought Content: Thought content normal.        Judgment: Judgment normal.    BP (!) 145/76 (BP Location: Left Wrist, Patient Position: Sitting, Cuff Size: Normal)   Pulse 70   Resp 18   Ht 5\' 4"  (1.626 m)   Wt 224 lb (101.6 kg)   BMI 38.45 kg/m   Past Medical History:  Diagnosis Date   A-fib (HCC)    Anemia    B12 deficiency    Breast cancer (HCC) 03/31/2013   Right - chemo- mastectomy   Breast cancer (HCC) 1991   Rt.- radiation   Complication of anesthesia 01/2009   Recent years with general anesthesia had itching following surgery   DDD (degenerative disc disease), cervical    Depression    Diverticulitis    Dyspnea    Edema of both legs    GERD (gastroesophageal reflux disease)    Gout    H/O mastectomy 2014  per patient   H/O: cesarean section 1987   per patient report   H/O: hysterectomy 1996   per patient   Hydradenitis    Hypertension    Hypothyroidism    Neuropathy    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HCC)    Parkinson's disease (HCC)    Personal history of chemotherapy    Personal history of radiation therapy    Ruptured cervical disc 2002   per patient    Sleep apnea    cpap machine   Super obese    Tachycardia     Social History   Socioeconomic History   Marital status: Widowed    Spouse name: Not on file   Number of children: Not on file   Years of education: Not on file   Highest education level: Not on file  Occupational History   Not on file  Tobacco Use   Smoking status: Never   Smokeless tobacco: Never  Vaping Use   Vaping status: Not on file  Substance and Sexual Activity   Alcohol use: No    Alcohol/week: 0.0 standard drinks of alcohol   Drug use: No   Sexual activity: Not on file  Other Topics Concern   Not on file  Social History Narrative   Not on file   Social Drivers of Health   Financial Resource Strain:  Low Risk  (01/19/2024)   Received from [facility]   Overall Financial Resource Strain (CARDIA)    Difficulty of Paying Living Expenses: Not hard at all  Food Insecurity: No Food Insecurity (01/19/2024)   Received from [facility]   Hunger Vital Sign    Worried About Running Out of Food in the Last Year: Never true    Ran Out of Food in the Last Year: Never true  Transportation Needs: No Transportation Needs (01/19/2024)   Received from [facility] - Transportation    In the past 12 months, has lack of transportation kept you from medical appointments or from getting medications?: No    Lack of Transportation (Non-Medical): No  Physical Activity: Insufficiently Active (05/02/2023)   Received from [facility]   Exercise Vital Sign    Days of Exercise per Week: 1 day    Minutes of Exercise per Session: 30 min  Stress: Stress Concern Present (05/02/2023)   Received from [facility] - Occupational Stress Questionnaire    Feeling of Stress : To some extent  Social Connections: Moderately Isolated (05/02/2023)   Received from [facility]   Social Connection and Isolation Panel [NHANES]    Frequency of Communication with Friends and Family: Three times a week    Frequency of Social Gatherings with Friends and Family: Once a week    Attends Religious Services: More than 4 times per year    Active Member of [employer] or Organizations: No    Attends Banker Meetings: Never    Marital Status: Widowed  Intimate Partner Violence: Not At Risk (06/29/2023)   Humiliation, Afraid, Rape, and Kick questionnaire    Fear of Current or Ex-Partner: No    Emotionally Abused: No    Physically Abused: No    Sexually Abused: No    Past Surgical History:  Procedure Laterality Date   ABDOMINAL HYSTERECTOMY     APPLICATION OF INTRAOPERATIVE CT  SCAN N/A 07/07/2023   Procedure: APPLICATION OF INTRAOPERATIVE CT  SCAN;  Surgeon: Carroll Clamp, MD;  Location: [facility];  Service: Neurosurgery;  Laterality: N/A;   BACK SURGERY  2002   plate and 2 screws in neck   BREAST BIOPSY Right 1991,2014   Positive   BREAST CYST ASPIRATION Left    BREAST SURGERY Right 2014   mastectomy   CATARACT EXTRACTION W/PHACO Left 05/17/2019   Procedure: CATARACT EXTRACTION PHACO AND INTRAOCULAR LENS PLACEMENT (IOC);  Surgeon: Rosa College, MD;  Location: [facility];  Service: Ophthalmology;  Laterality: Left;  US  00:28 CDE 2.15 FLUID PACK LOT # [id number]   CATARACT EXTRACTION W/PHACO Right 06/14/2019   Procedure: CATARACT EXTRACTION PHACO AND INTRAOCULAR LENS PLACEMENT (IOC) RIGHT;  Surgeon: Rosa College, MD;  Location: [facility];  Service: Ophthalmology;  Laterality: Right;  US  00:35.0 CDE 2.37 Fluid Pack Lot #[id number]   CESAREAN SECTION     CHOLECYSTECTOMY  04/09/2012   COLONOSCOPY WITH PROPOFOL  N/A 06/22/2015   Procedure: COLONOSCOPY WITH PROPOFOL ;  Surgeon: Cassie Click, MD;  Location: [facility] ENDOSCOPY;  Service: Endoscopy;  Laterality: N/A;   COLONOSCOPY WITH PROPOFOL  N/A 05/25/2021   Procedure: COLONOSCOPY WITH PROPOFOL ;  Surgeon: Shane Darling, MD;  Location: [facility] ENDOSCOPY;  Service: Endoscopy;  Laterality: N/A;   ESOPHAGOGASTRODUODENOSCOPY     ESOPHAGOGASTRODUODENOSCOPY N/A 05/25/2021   Procedure: ESOPHAGOGASTRODUODENOSCOPY (EGD);  Surgeon: Shane Darling, MD;  Location: [facility] ENDOSCOPY;  Service: Endoscopy;  Laterality: N/A;   EYE SURGERY     LAPAROSCOPIC GASTRIC BYPASS     MASTECTOMY Right    MASTECTOMY MODIFIED RADICAL     mastectomy partial      lumpectomy   NEUROPLASTY / TRANSPOSITION ULNAR NERVE AT ELBOW     OOPHORECTOMY     PORT-A-CATH REMOVAL Left 05/13/2015   Procedure: REMOVAL PORT-A-CATH LEFT CHEST ;  Surgeon: Claudia Cuff, MD;  Location: [facility] OR[facility];  Service: General;  Laterality: Left;   REDUCTION MAMMAPLASTY  Left    REVERSE SHOULDER ARTHROPLASTY Right 12/03/2020   Procedure: REVERSE SHOULDER ARTHROPLASTY;  Surgeon: Elner Hahn, MD;  Location: [facility] OR[facility];  Service: Orthopedics;  Laterality: Right;   ROUX-EN-Y GASTRIC BYPASS  01/26/2009   SHOULDER ARTHROSCOPY WITH OPEN ROTATOR CUFF REPAIR Right 01/03/2017   Procedure: SHOULDER ARTHROSCOPY WITH OPEN ROTATOR CUFF REPAIR;  Surgeon: Elner Hahn, MD;  Location: [facility];  Service: Orthopedics;  Laterality: Right;   SHOULDER ARTHROSCOPY WITH SUBACROMIAL DECOMPRESSION, ROTATOR CUFF REPAIR AND BICEP TENDON REPAIR Right 01/03/2017   Procedure: SHOULDER ARTHROSCOPY WITH SUBACROMIAL DECOMPRESSION, ROTATOR CUFF REPAIR AND BICEP TENDON REPAIR;  Surgeon: Elner Hahn, MD;  Location: [facility];  Service: Orthopedics;  Laterality: Right;  Limited debridement   SPINE SURGERY      Family History  Problem Relation Age of Onset   Hypertension Mother    Lymphoma Mother    Cancer Sister        Breast   Breast cancer Sister 16    Allergies  Allergen Reactions   Advair Diskus [Fluticasone-Salmeterol] Other (See Comments)    Joint pain    Alendronate     Muscle pain   Singulair [Montelukast] Other (See Comments)    "muscle pain"   Sulfa Antibiotics Hives   Venlafaxine Other (See Comments)    "altered mental status/tremors"       Latest Ref Rng & Units 07/12/2023    3:53 AM 07/08/2023    5:20 AM 07/06/2023    5:46 AM  CBC  WBC 4.0 - 10.5 K/uL 7.5  14.1  6.2   Hemoglobin 12.0 - 15.0 g/dL 40.9  81.1  91.4   Hematocrit 36.0 - 46.0 % 31.4  36.0  40.7   Platelets 150 - 400 K/uL 274  244  239        CMP     Component Value Date/Time   NA 135 07/09/2023 0458   NA 136 12/24/2013 1117   K 4.3 07/09/2023 0458   K 4.2 01/13/2014 0958   CL 97 (L) 07/09/2023 0458   CL 98 12/24/2013 1117   CO2 29 07/09/2023 0458   CO2 31 12/24/2013 1117   GLUCOSE 84 07/09/2023 0458   GLUCOSE 90 12/24/2013 1117   BUN 44 (H) 07/09/2023 0458   BUN 8 12/24/2013 1117    CREATININE 0.86 07/09/2023 0458   CREATININE 0.74 07/23/2014 1441   CALCIUM  8.2 (L) 07/09/2023 0458   CALCIUM  8.7 12/24/2013 1117   PROT 6.5 01/13/2023 0435   PROT 6.6 06/24/2013 1112   ALBUMIN 3.0 (L) 01/13/2023 0435   ALBUMIN 3.1 (L) 06/24/2013 1112   AST 28 01/13/2023 0435   AST 26 06/24/2013 1112   ALT 13 01/13/2023 0435   ALT 22 06/24/2013 1112   ALKPHOS 87 01/13/2023 0435   ALKPHOS 101 06/24/2013 1112   BILITOT 1.7 (H) 01/13/2023 0435   BILITOT 0.2 06/24/2013 1112   GFRNONAA >60 07/09/2023 0458   GFRNONAA >60 07/23/2014 1441   GFRNONAA >60 12/24/2013 1117     No results found.     Assessment & Plan:   1. Lymphedema (Primary) Swelling is under good control with compression socks but today they don't appear to be tight enough thus more leg swelling in her right leg then left. She continues  elevation and she does have a lymphedema pump although she is not using it regularly at this time.  She is not very mobile these days.  So exercise is limited.  She can continue with the conservative measures is much as possible and we will continue to follow her on an annual basis.  2. AF (paroxysmal atrial fibrillation) (HCC) Continue medications prescribed for atrial fibrillation as already ordered.  Poor cardiac function can worsen lower extremity edema if atrial fibrillation is not controlled.  These medications were reviewed and no changes at this time.  3. Primary hypertension Continue antihypertensive medications as already ordered, these medications have been reviewed and there are no changes at this time.   Current Outpatient Medications on File Prior to Visit  Medication Sig Dispense Refill   allopurinol  (ZYLOPRIM ) 300 MG tablet Take 300 mg by mouth daily.      baclofen  (LIORESAL ) 10 MG tablet Take 0.5-1 tablets (5-10 mg total) by mouth 2 (two) times daily. 60 each 5   Calcium  Carb-Cholecalciferol  600-400 MG-UNIT TABS Take 1 tablet by mouth 2 (two) times daily with a meal.      DULoxetine  (CYMBALTA ) 60 MG capsule Take 60 mg by mouth 2 (two) times daily.     gabapentin  (NEURONTIN ) 300 MG capsule Take 1 capsule (300 mg total) by mouth 4 (four) times daily. 120 capsule 0   ipratropium (ATROVENT) 0.03 % nasal spray Place 2 sprays into both nostrils 2 (two) times daily.     levothyroxine  (SYNTHROID , LEVOTHROID) 50 MCG tablet Take 50 mcg by mouth daily before breakfast.     Magnesium  400 MG CAPS Take 400 mg by mouth daily.     methocarbamol  (ROBAXIN ) 500 MG tablet Take 1 tablet (500 mg total) by mouth every 6 (six) hours as  needed for muscle spasms (Back pain). 30 tablet 0   metoprolol  tartrate (LOPRESSOR ) 100 MG tablet Take 100 mg by mouth 2 (two) times daily.     omeprazole (PRILOSEC) 20 MG capsule Take 20 mg by mouth daily.      vitamin B-12 (CYANOCOBALAMIN ) 1000 MCG tablet Take 1,000 mcg by mouth daily.     warfarin (COUMADIN ) 1 MG tablet Take 3 tablets (3 mg total) by mouth daily at 4 PM. Monday and Friday     warfarin (COUMADIN ) 6 MG tablet Take 1 tablet (6 mg total) by mouth daily at 4 PM. Tuesday, Wednesday, Thursday, Saturday and Sunday     No current facility-administered medications on file prior to visit.    There are no Patient Instructions on file for this visit. No follow-ups on file.   Annamaria Barrette, NP

## 2024-03-27 ENCOUNTER — Ambulatory Visit: Payer: Medicare HMO | Admitting: Neurosurgery

## 2024-03-28 ENCOUNTER — Other Ambulatory Visit: Payer: Self-pay

## 2024-03-28 DIAGNOSIS — M542 Cervicalgia: Secondary | ICD-10-CM

## 2024-03-28 DIAGNOSIS — M4802 Spinal stenosis, cervical region: Secondary | ICD-10-CM

## 2024-04-01 ENCOUNTER — Encounter: Payer: Self-pay | Admitting: Neurosurgery

## 2024-04-01 ENCOUNTER — Telehealth: Payer: Self-pay

## 2024-04-01 ENCOUNTER — Ambulatory Visit
Admission: RE | Admit: 2024-04-01 | Discharge: 2024-04-01 | Disposition: A | Discharge status: Home / Self Care | Source: Ambulatory Visit | Attending: Neurosurgery | Admitting: Neurosurgery

## 2024-04-01 ENCOUNTER — Ambulatory Visit: Admitting: Neurosurgery

## 2024-04-01 ENCOUNTER — Ambulatory Visit
Admission: RE | Admit: 2024-04-01 | Discharge: 2024-04-01 | Disposition: A | Discharge status: Home / Self Care | Attending: Neurosurgery | Admitting: Neurosurgery

## 2024-04-01 VITALS — BP 116/80 | Ht 64.0 in | Wt 224.0 lb

## 2024-04-01 DIAGNOSIS — Z981 Arthrodesis status: Secondary | ICD-10-CM

## 2024-04-01 DIAGNOSIS — M4714 Other spondylosis with myelopathy, thoracic region: Secondary | ICD-10-CM | POA: Insufficient documentation

## 2024-04-01 DIAGNOSIS — M542 Cervicalgia: Secondary | ICD-10-CM | POA: Diagnosis present

## 2024-04-01 NOTE — Telephone Encounter (Signed)
 Per Dr Claudene, he ordered cervical xrays (AP, lateral, flexion, extension) and the patient got obliques instead of flexion extension views. He sent a secure chat to Waddell Gains, RT about this. This was also discussed with Joesph Ahle, RTR.   I have contacted the patient to apologize and request that she return to [facility] at her convenience to have the missing xray views completed. I informed her that I spoke with a manager to request that she not be charged for the additional views and if she has any issues, she can call our office or ask them to call our office while she is there. She stated that she will return for the xrays as soon as she can find a ride, but she will not be able to return today.

## 2024-04-01 NOTE — Progress Notes (Signed)
 REFERRING PHYSICIAN:  Fernande Ophelia JINNY Dixon, Suzanne Dixon [address] [facility] [place],  [place] [address]  DOS: 07/07/23  Transpedicular decompression and fusion T10-T11  HISTORY OF PRESENT ILLNESS: Patient is here today to discuss ongoing symptomatology.  She states that she has had some back and neck soreness.  She does have notable history of a previous cervical fusion.  Overall her strength is improved dramatically since preoperative.  She has not had any worsening incisional issues.  Overall she does state that she feels like she gets a significant amount of lower extremity pain when she tries to bring her legs up in the bed.    PHYSICAL EXAMINATION:  General: Patient is well developed, well nourished, calm, collected, and in no apparent distress.   NEUROLOGICAL:  General: In no acute distress.   Awake, alert, oriented to person, place, and time.  Pupils equal round and reactive to light.  Facial tone is symmetric.     Strength:           Side Iliopsoas Quads Hamstring PF DF EHL  R 5 4+ 4+ 5 5 5   L 5 5 5 5 5 5    Incision well healed   ROS (Neurologic):  Negative except as noted above  IMAGING: Narrative & Impression  EXAMINATION: MR BRAIN WO CONTRAST   HISTORY: Headaches   TECHNIQUE: MRI of the brain performed without IV contrast.   COMPARISON: 02/02/2021, 01/12/2023   FINDINGS:   No evidence for intracranial mass, hemorrhage, or acute infarct. There are mild bilateral periventricular and subcortical white matter T2 hyperintensities, which are nonspecific, but most likely secondary to chronic microvascular ischemia. The ventricles are symmetric and the basilar cisterns are patent. Old lacunar infarct noted in the right cerebellar hemisphere.   The ventricles are symmetric and the basilar cisterns are patent. Major intracranial flow voids are preserved.   The paranasal sinuses and mastoid air cells appear well aerated. The orbits appear normal. There  appears to be high-grade spinal canal stenosis at the level of C3-C4.   IMPRESSION:   Mild chronic microvascular ischemic changes with old lacunar infarct in the right cerebellar hemisphere.   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 MRI of the cervical spine considered for further evaluation.   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  There is an MRI pending to more clearly evaluate this.   ASSESSMENT/PLAN:  Suzanne Dixon is doing reasonably well status post the above surgery.  In regards to her thoracic fusion, she does have some mid back pain, however her neurologic exam has improved significantly.  She has been working with physical therapy and physical medicine and has had an improvement in her spasms.  Will plan on getting x-rays of her thoracic spine to evaluate for any hardware failure.  In regards to her chronic but worsening neck pain, she does have a history of a cervical spondylosis and cervical spine fusion.  She does not show any evidence of new myelopathy on clinical examination, however she does have some pain in her hips which does not appear to be weakness.  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  I will  also get baseline x-rays to evaluate for any mobile component.  Suzanne Dixon, Suzanne Dixon  Department of neurosurgery

## 2024-04-02 ENCOUNTER — Ambulatory Visit: Payer: Self-pay | Admitting: Neurosurgery

## 2024-04-05 ENCOUNTER — Ambulatory Visit: Admission: RE | Admit: 2024-04-05 | Source: Ambulatory Visit

## 2024-04-05 ENCOUNTER — Ambulatory Visit
Admission: RE | Admit: 2024-04-05 | Discharge: 2024-04-05 | Disposition: A | Discharge status: Home / Self Care | Attending: Neurosurgery | Admitting: Neurosurgery

## 2024-04-08 ENCOUNTER — Ambulatory Visit: Admitting: Neurosurgery

## 2024-04-10 ENCOUNTER — Telehealth (INDEPENDENT_AMBULATORY_CARE_PROVIDER_SITE_OTHER): Payer: Self-pay

## 2024-04-10 NOTE — Telephone Encounter (Signed)
 She can go into [employer], otherwise she can take tylenol  or ibuprofen for discomfort

## 2024-04-10 NOTE — Telephone Encounter (Signed)
 Patient left voicemail at this time in reference to her lymphedema being flared up. She stated her legs are extremely swollen and painful. Requesting any advice on relief from the pain. Please advise.

## 2024-04-10 NOTE — Telephone Encounter (Signed)
 Contacted patient at this time to let her know NP recommended Unna Boots for her lymphedema at this time as well as tylenol /ibuprofen for the pain/discomfort. Patient was on board with the treatment plan at this time.

## 2024-04-11 ENCOUNTER — Encounter (INDEPENDENT_AMBULATORY_CARE_PROVIDER_SITE_OTHER): Payer: Self-pay

## 2024-04-11 ENCOUNTER — Telehealth (INDEPENDENT_AMBULATORY_CARE_PROVIDER_SITE_OTHER): Payer: Self-pay

## 2024-04-11 ENCOUNTER — Ambulatory Visit (INDEPENDENT_AMBULATORY_CARE_PROVIDER_SITE_OTHER): Admitting: Nurse Practitioner

## 2024-04-11 VITALS — BP 127/88 | HR 78 | Resp 18

## 2024-04-11 DIAGNOSIS — I890 Lymphedema, not elsewhere classified: Secondary | ICD-10-CM

## 2024-04-11 NOTE — Telephone Encounter (Signed)
 [facility] home [facility] will start weekly bilateral unna wraps next week.

## 2024-04-11 NOTE — Progress Notes (Signed)
 Subjective:    Patient ID: Suzanne Dixon, female    DOB: 02-28-1951, 73 y.o.   MRN: [id number] Chief Complaint  Patient presents with   Follow-up    Bilateral le swelling    The patient returns to the office for followup evaluation regarding leg swelling.  The swelling has persisted and the pain associated with swelling continues. There have not been any interval development of a ulcerations or wounds.  Since the previous visit the patient has been wearing graduated compression stockings and has noted little if any improvement in the lymphedema. The patient has been using compression routinely morning until night.  The patient also states elevation during the day and exercise is being done too.      Review of Systems  Cardiovascular:  Positive for leg swelling.  Neurological:  Positive for weakness.  All other systems reviewed and are negative.      Objective:   Physical Exam Vitals reviewed.  HENT:     Head: Normocephalic.  Cardiovascular:     Rate and Rhythm: Normal rate.  Pulmonary:     Effort: Pulmonary effort is normal.  Musculoskeletal:     Right lower leg: Edema present.     Left lower leg: Edema present.  Skin:    General: Skin is warm and dry.  Neurological:     Mental Status: She is alert and oriented to person, place, and time.  Psychiatric:        Mood and Affect: Mood normal.        Behavior: Behavior normal.        Thought Content: Thought content normal.        Judgment: Judgment normal.     BP 127/88   Pulse 78   Resp 18   Past Medical History:  Diagnosis Date   A-fib (HCC)    Anemia    B12 deficiency    Breast cancer (HCC) 03/31/2013   Right - chemo- mastectomy   Breast cancer (HCC) 1991   Rt.- radiation   Complication of anesthesia 01/2009   Recent years with general anesthesia had itching following surgery   DDD (degenerative disc disease), cervical    Depression    Diverticulitis    Dyspnea    Edema of both legs    GERD  (gastroesophageal reflux disease)    Gout    H/O mastectomy 2014   per patient   H/O: cesarean section 1987   per patient report   H/O: hysterectomy 1996   per patient   Hydradenitis    Hypertension    Hypothyroidism    Neuropathy    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HCC)    Parkinson's disease (HCC)    Personal history of chemotherapy    Personal history of radiation therapy    Ruptured cervical disc 2002   per patient    Sleep apnea    cpap machine   Super obese    Tachycardia     Social History   Socioeconomic History   Marital status: Widowed    Spouse name: Not on file   Number of children: Not on file   Years of education: Not on file   Highest education level: Not on file  Occupational History   Not on file  Tobacco Use   Smoking status: Never   Smokeless tobacco: Never  Vaping Use   Vaping status: Not on file  Substance and Sexual Activity   Alcohol use: No    Alcohol/week: 0.0 standard  drinks of alcohol   Drug use: No   Sexual activity: Not on file  Other Topics Concern   Not on file  Social History Narrative   Not on file   Social Drivers of Health   Financial Resource Strain: Low Risk  (01/19/2024)   Received from [facility]   Overall Financial Resource Strain (CARDIA)    Difficulty of Paying Living Expenses: Not hard at all  Food Insecurity: No Food Insecurity (01/19/2024)   Received from [facility]   Hunger Vital Sign    Within the past 12 months, you worried that your food would run out before you got the money to buy more.: Never true    Within the past 12 months, the food you bought just didn't last and you didn't have money to get more.: Never true  Transportation Needs: No Transportation Needs (01/19/2024)   Received from [facility] - Transportation    In the past 12 months, has lack of transportation kept you from medical appointments or from getting medications?: No    Lack of  Transportation (Non-Medical): No  Physical Activity: Insufficiently Active (05/02/2023)   Received from [facility]   Exercise Vital Sign    On average, how many days per week do you engage in moderate to strenuous exercise (like a brisk walk)?: 1 day    On average, how many minutes do you engage in exercise at this level?: 30 min  Stress: Stress Concern Present (05/02/2023)   Received from [facility] - Occupational Stress Questionnaire    Feeling of Stress : To some extent  Social Connections: Moderately Isolated (05/02/2023)   Received from [facility]   Social Connection and Isolation Panel    In a typical week, how many times do you talk on the phone with family, friends, or neighbors?: Three times a week    How often do you get together with friends or relatives?: Once a week    How often do you attend church or religious services?: More than 4 times per year    Do you belong to any clubs or organizations such as church groups, unions, fraternal or athletic groups, or school groups?: No    How often do you attend meetings of the clubs or organizations you belong to?: Never    Are you married, widowed, divorced, separated, never married, or living with a partner?: Widowed  Intimate Partner Violence: Not At Risk (06/29/2023)   Humiliation, Afraid, Rape, and Kick questionnaire    Fear of Current or Ex-Partner: No    Emotionally Abused: No    Physically Abused: No    Sexually Abused: No    Past Surgical History:  Procedure Laterality Date   ABDOMINAL HYSTERECTOMY     APPLICATION OF INTRAOPERATIVE CT SCAN N/A 07/07/2023   Procedure: APPLICATION OF INTRAOPERATIVE CT SCAN;  Surgeon: [name], MD;  Location: [facility] ORS;  Service: Neurosurgery;  Laterality: N/A;   BACK SURGERY  2002   plate and 2 screws in neck   BREAST BIOPSY Right 1991,2014   Positive   BREAST CYST ASPIRATION Left     BREAST SURGERY Right 2014   mastectomy   CATARACT EXTRACTION W/PHACO Left 05/17/2019   Procedure: CATARACT EXTRACTION PHACO AND INTRAOCULAR LENS PLACEMENT (IOC);  Surgeon: Myrna Adine Anes, MD;  Location: [facility] ORS;  Service: Ophthalmology;  Laterality: Left;  US   00:28 CDE 2.15 FLUID PACK LOT # [id number]   CATARACT EXTRACTION W/PHACO Right 06/14/2019   Procedure: CATARACT EXTRACTION PHACO AND INTRAOCULAR LENS PLACEMENT (IOC) RIGHT;  Surgeon: Myrna Adine Anes, MD;  Location: [facility] ORS;  Service: Ophthalmology;  Laterality: Right;  US  00:35.0 CDE 2.37 Fluid Pack Lot #[id number]   CESAREAN SECTION     CHOLECYSTECTOMY  04/09/2012   COLONOSCOPY WITH PROPOFOL  N/A 06/22/2015   Procedure: COLONOSCOPY WITH PROPOFOL ;  Surgeon: Lamar ONEIDA Holmes, MD;  Location: [facility] ENDOSCOPY;  Service: Endoscopy;  Laterality: N/A;   COLONOSCOPY WITH PROPOFOL  N/A 05/25/2021   Procedure: COLONOSCOPY WITH PROPOFOL ;  Surgeon: Maryruth Ole ONEIDA, MD;  Location: [facility] ENDOSCOPY;  Service: Endoscopy;  Laterality: N/A;   ESOPHAGOGASTRODUODENOSCOPY     ESOPHAGOGASTRODUODENOSCOPY N/A 05/25/2021   Procedure: ESOPHAGOGASTRODUODENOSCOPY (EGD);  Surgeon: Maryruth Ole ONEIDA, MD;  Location: [facility] ENDOSCOPY;  Service: Endoscopy;  Laterality: N/A;   EYE SURGERY     LAPAROSCOPIC GASTRIC BYPASS     MASTECTOMY Right    MASTECTOMY MODIFIED RADICAL     mastectomy partial      lumpectomy   NEUROPLASTY / TRANSPOSITION ULNAR NERVE AT ELBOW     OOPHORECTOMY     PORT-A-CATH REMOVAL Left 05/13/2015   Procedure: REMOVAL PORT-A-CATH LEFT CHEST ;  Surgeon: Charlie FORBES Fell, MD;  Location: [facility] ORS;  Service: General;  Laterality: Left;   REDUCTION MAMMAPLASTY Left    REVERSE SHOULDER ARTHROPLASTY Right 12/03/2020   Procedure: REVERSE SHOULDER ARTHROPLASTY;  Surgeon: Edie Norleen PARAS, MD;  Location: [facility] ORS;  Service: Orthopedics;  Laterality: Right;   ROUX-EN-Y GASTRIC BYPASS  01/26/2009   SHOULDER ARTHROSCOPY WITH OPEN ROTATOR CUFF REPAIR Right  01/03/2017   Procedure: SHOULDER ARTHROSCOPY WITH OPEN ROTATOR CUFF REPAIR;  Surgeon: Norleen PARAS Edie, MD;  Location: [facility];  Service: Orthopedics;  Laterality: Right;   SHOULDER ARTHROSCOPY WITH SUBACROMIAL DECOMPRESSION, ROTATOR CUFF REPAIR AND BICEP TENDON REPAIR Right 01/03/2017   Procedure: SHOULDER ARTHROSCOPY WITH SUBACROMIAL DECOMPRESSION, ROTATOR CUFF REPAIR AND BICEP TENDON REPAIR;  Surgeon: Norleen PARAS Edie, MD;  Location: [facility] ORS;  Service: Orthopedics;  Laterality: Right;  Limited debridement   SPINE SURGERY      Family History  Problem Relation Age of Onset   Hypertension Mother    Lymphoma Mother    Cancer Sister        Breast   Breast cancer Sister 65    Allergies  Allergen Reactions   Advair Diskus [Fluticasone-Salmeterol] Other (See Comments)    Joint pain    Alendronate     Muscle pain   Singulair [Montelukast] Other (See Comments)    muscle pain   Sulfa Antibiotics Hives   Venlafaxine Other (See Comments)    altered mental status/tremors       Latest Ref Rng & Units 07/12/2023    3:53 AM 07/08/2023    5:20 AM 07/06/2023    5:46 AM  CBC  WBC 4.0 - 10.5 K/uL 7.5  14.1  6.2   Hemoglobin 12.0 - 15.0 g/dL 89.2  87.4  86.1   Hematocrit 36.0 - 46.0 % 31.4  36.0  40.7   Platelets 150 - 400 K/uL 274  244  239       CMP     Component Value Date/Time   NA 135 07/09/2023 0458   NA 136 12/24/2013 1117   K 4.3 07/09/2023 0458   K 4.2 01/13/2014 0958   CL 97 (L) 07/09/2023 0458   CL 98 12/24/2013 1117   CO2 29  07/09/2023 0458   CO2 31 12/24/2013 1117   GLUCOSE 84 07/09/2023 0458   GLUCOSE 90 12/24/2013 1117   BUN 44 (H) 07/09/2023 0458   BUN 8 12/24/2013 1117   CREATININE 0.86 07/09/2023 0458   CREATININE 0.74 07/23/2014 1441   CALCIUM  8.2 (L) 07/09/2023 0458   CALCIUM  8.7 12/24/2013 1117   PROT 6.5 01/13/2023 0435   PROT 6.6 06/24/2013 1112   ALBUMIN 3.0 (L) 01/13/2023 0435   ALBUMIN 3.1 (L) 06/24/2013 1112   AST 28 01/13/2023 0435   AST 26  06/24/2013 1112   ALT 13 01/13/2023 0435   ALT 22 06/24/2013 1112   ALKPHOS 87 01/13/2023 0435   ALKPHOS 101 06/24/2013 1112   BILITOT 1.7 (H) 01/13/2023 0435   BILITOT 0.2 06/24/2013 1112   GFRNONAA >60 07/09/2023 0458   GFRNONAA >60 07/23/2014 1441   GFRNONAA >60 12/24/2013 1117     No results found.     Assessment & Plan:   1. Lymphedema (Primary) No surgery or intervention at this point in time.    I have had a long discussion with the patient regarding venous insufficiency and why it  causes symptoms, specifically venous ulceration. I have discussed with the patient the chronic skin changes that accompany venous insufficiency and the long term sequela such as infection and recurring  ulceration.  Patient will be placed in [employer] which will be changed weekly drainage permitting.  In addition, behavioral modification including several periods of elevation of the lower extremities during the day will be continued. Achieving a position with the ankles at heart level was stressed to the patient  The patient is instructed to begin routine exercise, especially walking on a daily basis  In the future the patient can be assessed for graduated compression stockings or wraps as well as a Lymph Pump once the ulcers are healed.   Current Outpatient Medications on File Prior to Visit  Medication Sig Dispense Refill   allopurinol  (ZYLOPRIM ) 300 MG tablet Take 300 mg by mouth daily.      baclofen  (LIORESAL ) 10 MG tablet Take 0.5-1 tablets (5-10 mg total) by mouth 2 (two) times daily. 60 each 5   Calcium  Carb-Cholecalciferol  600-400 MG-UNIT TABS Take 1 tablet by mouth 2 (two) times daily with a meal.     diclofenac Sodium (VOLTAREN) 1 % GEL Apply 2 g topically.     DULoxetine  (CYMBALTA ) 60 MG capsule Take 60 mg by mouth 2 (two) times daily.     gabapentin  (NEURONTIN ) 300 MG capsule Take 1 capsule (300 mg total) by mouth 4 (four) times daily. 120 capsule 0   ipratropium (ATROVENT) 0.03 %  nasal spray Place 2 sprays into both nostrils 2 (two) times daily.     levothyroxine  (SYNTHROID , LEVOTHROID) 50 MCG tablet Take 50 mcg by mouth daily before breakfast.     magnesium  oxide (MAG-OX) 400 MG tablet Take 1 tablet by mouth daily.     meloxicam  (MOBIC ) 15 MG tablet Take 1 tablet by mouth daily as needed.     methocarbamol  (ROBAXIN ) 500 MG tablet Take 1 tablet (500 mg total) by mouth every 6 (six) hours as needed for muscle spasms (Back pain). 30 tablet 0   metoprolol  tartrate (LOPRESSOR ) 100 MG tablet Take 100 mg by mouth 2 (two) times daily.     omeprazole (PRILOSEC) 20 MG capsule Take 20 mg by mouth daily.      Vibegron 75 MG TABS Take 75 mg by mouth.     vitamin  B-12 (CYANOCOBALAMIN ) 1000 MCG tablet Take 1,000 mcg by mouth daily.     warfarin (COUMADIN ) 1 MG tablet Take 3 tablets (3 mg total) by mouth daily at 4 PM. Monday and Friday     warfarin (COUMADIN ) 6 MG tablet Take 1 tablet (6 mg total) by mouth daily at 4 PM. Tuesday, Wednesday, Thursday, Saturday and Sunday     No current facility-administered medications on file prior to visit.    There are no Patient Instructions on file for this visit. No follow-ups on file.   Suzanne Gongora E Yuleimy Kretz, NP

## 2024-04-12 ENCOUNTER — Encounter (INDEPENDENT_AMBULATORY_CARE_PROVIDER_SITE_OTHER)

## 2024-05-10 ENCOUNTER — Ambulatory Visit (INDEPENDENT_AMBULATORY_CARE_PROVIDER_SITE_OTHER): Admitting: Nurse Practitioner

## 2024-05-14 ENCOUNTER — Ambulatory Visit (INDEPENDENT_AMBULATORY_CARE_PROVIDER_SITE_OTHER): Payer: Medicare HMO | Admitting: Vascular Surgery

## 2024-05-22 ENCOUNTER — Telehealth (INDEPENDENT_AMBULATORY_CARE_PROVIDER_SITE_OTHER): Payer: Self-pay | Admitting: Vascular Surgery

## 2024-05-22 NOTE — Telephone Encounter (Signed)
 Given that it is a sore behind her knee, she should see her PCP for evaluation

## 2024-05-22 NOTE — Telephone Encounter (Signed)
 Patient has been notified with medical recommendations and verbalized understanding

## 2024-05-22 NOTE — Telephone Encounter (Signed)
 Patient Left voicemail on [facility] line stating she has a sore on the back of her knee and it has an odor to it. It's also draining. Please advise what should be scheduled for patient.

## 2024-05-26 ENCOUNTER — Emergency Department

## 2024-05-26 ENCOUNTER — Inpatient Hospital Stay
Admission: EM | Admit: 2024-05-26 | Discharge: 2024-06-01 | DRG: 871 | Disposition: A | Discharge status: Home Health | Attending: Internal Medicine | Admitting: Internal Medicine

## 2024-05-26 ENCOUNTER — Other Ambulatory Visit: Payer: Self-pay

## 2024-05-26 ENCOUNTER — Encounter: Payer: Self-pay | Admitting: Emergency Medicine

## 2024-05-26 DIAGNOSIS — I081 Rheumatic disorders of both mitral and tricuspid valves: Secondary | ICD-10-CM | POA: Diagnosis present

## 2024-05-26 DIAGNOSIS — R531 Weakness: Secondary | ICD-10-CM

## 2024-05-26 DIAGNOSIS — Z96611 Presence of right artificial shoulder joint: Secondary | ICD-10-CM | POA: Diagnosis present

## 2024-05-26 DIAGNOSIS — Z9049 Acquired absence of other specified parts of digestive tract: Secondary | ICD-10-CM

## 2024-05-26 DIAGNOSIS — Z90722 Acquired absence of ovaries, bilateral: Secondary | ICD-10-CM

## 2024-05-26 DIAGNOSIS — I10 Essential (primary) hypertension: Secondary | ICD-10-CM | POA: Insufficient documentation

## 2024-05-26 DIAGNOSIS — Z803 Family history of malignant neoplasm of breast: Secondary | ICD-10-CM

## 2024-05-26 DIAGNOSIS — Z9011 Acquired absence of right breast and nipple: Secondary | ICD-10-CM

## 2024-05-26 DIAGNOSIS — R7881 Bacteremia: Secondary | ICD-10-CM | POA: Diagnosis present

## 2024-05-26 DIAGNOSIS — A419 Sepsis, unspecified organism: Principal | ICD-10-CM

## 2024-05-26 DIAGNOSIS — Z1152 Encounter for screening for COVID-19: Secondary | ICD-10-CM

## 2024-05-26 DIAGNOSIS — E876 Hypokalemia: Secondary | ICD-10-CM | POA: Diagnosis present

## 2024-05-26 DIAGNOSIS — Z7901 Long term (current) use of anticoagulants: Secondary | ICD-10-CM

## 2024-05-26 DIAGNOSIS — E538 Deficiency of other specified B group vitamins: Secondary | ICD-10-CM | POA: Diagnosis present

## 2024-05-26 DIAGNOSIS — E66813 Obesity, class 3: Secondary | ICD-10-CM | POA: Diagnosis present

## 2024-05-26 DIAGNOSIS — I5033 Acute on chronic diastolic (congestive) heart failure: Secondary | ICD-10-CM | POA: Diagnosis present

## 2024-05-26 DIAGNOSIS — G4733 Obstructive sleep apnea (adult) (pediatric): Secondary | ICD-10-CM | POA: Diagnosis present

## 2024-05-26 DIAGNOSIS — I480 Paroxysmal atrial fibrillation: Secondary | ICD-10-CM | POA: Diagnosis present

## 2024-05-26 DIAGNOSIS — G20B1 Parkinson's disease with dyskinesia, without mention of fluctuations: Secondary | ICD-10-CM | POA: Diagnosis present

## 2024-05-26 DIAGNOSIS — F32A Depression, unspecified: Secondary | ICD-10-CM | POA: Diagnosis present

## 2024-05-26 DIAGNOSIS — Z79899 Other long term (current) drug therapy: Secondary | ICD-10-CM

## 2024-05-26 DIAGNOSIS — Z882 Allergy status to sulfonamides status: Secondary | ICD-10-CM

## 2024-05-26 DIAGNOSIS — Z923 Personal history of irradiation: Secondary | ICD-10-CM

## 2024-05-26 DIAGNOSIS — R7989 Other specified abnormal findings of blood chemistry: Secondary | ICD-10-CM | POA: Diagnosis present

## 2024-05-26 DIAGNOSIS — G629 Polyneuropathy, unspecified: Secondary | ICD-10-CM | POA: Diagnosis present

## 2024-05-26 DIAGNOSIS — Z90710 Acquired absence of both cervix and uterus: Secondary | ICD-10-CM

## 2024-05-26 DIAGNOSIS — I110 Hypertensive heart disease with heart failure: Secondary | ICD-10-CM | POA: Diagnosis present

## 2024-05-26 DIAGNOSIS — A4151 Sepsis due to Escherichia coli [E. coli]: Principal | ICD-10-CM | POA: Diagnosis present

## 2024-05-26 DIAGNOSIS — Z8249 Family history of ischemic heart disease and other diseases of the circulatory system: Secondary | ICD-10-CM

## 2024-05-26 DIAGNOSIS — Z961 Presence of intraocular lens: Secondary | ICD-10-CM | POA: Diagnosis present

## 2024-05-26 DIAGNOSIS — Z9221 Personal history of antineoplastic chemotherapy: Secondary | ICD-10-CM

## 2024-05-26 DIAGNOSIS — M109 Gout, unspecified: Secondary | ICD-10-CM | POA: Diagnosis present

## 2024-05-26 DIAGNOSIS — I890 Lymphedema, not elsewhere classified: Secondary | ICD-10-CM | POA: Diagnosis present

## 2024-05-26 DIAGNOSIS — K219 Gastro-esophageal reflux disease without esophagitis: Secondary | ICD-10-CM | POA: Diagnosis present

## 2024-05-26 DIAGNOSIS — E039 Hypothyroidism, unspecified: Secondary | ICD-10-CM | POA: Diagnosis present

## 2024-05-26 DIAGNOSIS — Z853 Personal history of malignant neoplasm of breast: Secondary | ICD-10-CM

## 2024-05-26 DIAGNOSIS — Z79890 Hormone replacement therapy (postmenopausal): Secondary | ICD-10-CM

## 2024-05-26 DIAGNOSIS — M4805 Spinal stenosis, thoracolumbar region: Principal | ICD-10-CM

## 2024-05-26 DIAGNOSIS — B9620 Unspecified Escherichia coli [E. coli] as the cause of diseases classified elsewhere: Secondary | ICD-10-CM | POA: Diagnosis present

## 2024-05-26 DIAGNOSIS — Z9884 Bariatric surgery status: Secondary | ICD-10-CM

## 2024-05-26 DIAGNOSIS — E871 Hypo-osmolality and hyponatremia: Secondary | ICD-10-CM | POA: Diagnosis present

## 2024-05-26 DIAGNOSIS — Z888 Allergy status to other drugs, medicaments and biological substances status: Secondary | ICD-10-CM

## 2024-05-26 DIAGNOSIS — Z6841 Body Mass Index (BMI) 40.0 and over, adult: Secondary | ICD-10-CM

## 2024-05-26 DIAGNOSIS — N390 Urinary tract infection, site not specified: Principal | ICD-10-CM | POA: Diagnosis present

## 2024-05-26 DIAGNOSIS — G20B2 Parkinson's disease with dyskinesia, with fluctuations: Secondary | ICD-10-CM

## 2024-05-26 LAB — CBC WITH DIFFERENTIAL/PLATELET
Abs Immature Granulocytes: 0.14 K/uL — ABNORMAL HIGH (ref 0.00–0.07)
Basophils Absolute: 0 K/uL (ref 0.0–0.1)
Basophils Relative: 0 %
Eosinophils Absolute: 0 K/uL (ref 0.0–0.5)
Eosinophils Relative: 0 %
HCT: 32.2 % — ABNORMAL LOW (ref 36.0–46.0)
Hemoglobin: 10.9 g/dL — ABNORMAL LOW (ref 12.0–15.0)
Immature Granulocytes: 1 %
Lymphocytes Relative: 6 %
Lymphs Abs: 1.4 K/uL (ref 0.7–4.0)
MCH: 29.9 pg (ref 26.0–34.0)
MCHC: 33.9 g/dL (ref 30.0–36.0)
MCV: 88.5 fL (ref 80.0–100.0)
Monocytes Absolute: 2.6 K/uL — ABNORMAL HIGH (ref 0.1–1.0)
Monocytes Relative: 11 %
Neutro Abs: 18.3 K/uL — ABNORMAL HIGH (ref 1.7–7.7)
Neutrophils Relative %: 82 %
Platelets: 208 K/uL (ref 150–400)
RBC: 3.64 MIL/uL — ABNORMAL LOW (ref 3.87–5.11)
RDW: 16.5 % — ABNORMAL HIGH (ref 11.5–15.5)
Smear Review: NORMAL
WBC: 22.5 K/uL — ABNORMAL HIGH (ref 4.0–10.5)
nRBC: 0 % (ref 0.0–0.2)

## 2024-05-26 LAB — RESP PANEL BY RT-PCR (RSV, FLU A&B, COVID)  RVPGX2
Influenza A by PCR: NEGATIVE
Influenza B by PCR: NEGATIVE
Resp Syncytial Virus by PCR: NEGATIVE
SARS Coronavirus 2 by RT PCR: NEGATIVE

## 2024-05-26 LAB — COMPREHENSIVE METABOLIC PANEL WITH GFR
ALT: 23 U/L (ref 0–44)
AST: 63 U/L — ABNORMAL HIGH (ref 15–41)
Albumin: 2.7 g/dL — ABNORMAL LOW (ref 3.5–5.0)
Alkaline Phosphatase: 86 U/L (ref 38–126)
Anion gap: 11 (ref 5–15)
BUN: 21 mg/dL (ref 8–23)
CO2: 23 mmol/L (ref 22–32)
Calcium: 7.5 mg/dL — ABNORMAL LOW (ref 8.9–10.3)
Chloride: 97 mmol/L — ABNORMAL LOW (ref 98–111)
Creatinine, Ser: 1.02 mg/dL — ABNORMAL HIGH (ref 0.44–1.00)
GFR, Estimated: 57 mL/min — ABNORMAL LOW (ref >60)
Glucose, Bld: 119 mg/dL — ABNORMAL HIGH (ref 70–99)
Potassium: 4 mmol/L (ref 3.5–5.1)
Sodium: 131 mmol/L — ABNORMAL LOW (ref 135–145)
Total Bilirubin: 1.3 mg/dL — ABNORMAL HIGH (ref 0.0–1.2)
Total Protein: 6.4 g/dL — ABNORMAL LOW (ref 6.5–8.1)

## 2024-05-26 LAB — BRAIN NATRIURETIC PEPTIDE: B Natriuretic Peptide: 289.5 pg/mL — ABNORMAL HIGH (ref 0.0–100.0)

## 2024-05-26 LAB — LACTIC ACID, PLASMA: Lactic Acid, Venous: 1.6 mmol/L (ref 0.5–1.9)

## 2024-05-26 LAB — PROTIME-INR
INR: 1.5 — ABNORMAL HIGH (ref 0.8–1.2)
Prothrombin Time: 18.7 s — ABNORMAL HIGH (ref 11.4–15.2)

## 2024-05-26 LAB — TROPONIN I (HIGH SENSITIVITY): Troponin I (High Sensitivity): 115 ng/L (ref <18)

## 2024-05-26 MED ORDER — SODIUM CHLORIDE 0.9 % IV BOLUS
1000.0000 mL | Freq: Once | INTRAVENOUS | Status: AC
Start: 2024-05-26 — End: 2024-05-27
  Administered 2024-05-26: 1000 mL via INTRAVENOUS

## 2024-05-26 MED ORDER — SODIUM CHLORIDE 0.9 % IV SOLN
2.0000 g | Freq: Once | INTRAVENOUS | Status: AC
  Administered 2024-05-26: 2 g via INTRAVENOUS
  Filled 2024-05-26: qty 20

## 2024-05-26 NOTE — ED Provider Notes (Signed)
 [facility] Provider Note    Event Date/Time   First MD Initiated Contact with Patient 05/26/24 2133     (approximate)   History   Chief Complaint: Weakness   HPI  Suzanne Dixon is a 73 y.o. female with a history of Parkinson's disease, atrial fibrillation, obesity who comes to the ED complaining of generalized weakness, loss of appetite, dysuria that started yesterday.  She contacted her doctor who sent a prescription for Keflex  which she took 1 dose of today, but patient has had acute decline from her baseline in the setting of the current illness.  No falls or trauma.  Also reports developing lymphedema over the past week or 2.  She saw her doctor 3 days ago who started her on Lasix  which she has been taking.  She has not noticed any increased urine output from taking Lasix .  No decrease in her lower extremity swelling.  Does not have a scale at home to monitor her weight.  Denies chest pain or shortness of breath        Past Medical History:  Diagnosis Date   A-fib (HCC)    Anemia    B12 deficiency    Breast cancer (HCC) 03/31/2013   Right - chemo- mastectomy   Breast cancer (HCC) 1991   Rt.- radiation   Complication of anesthesia 01/2009   Recent years with general anesthesia had itching following surgery   DDD (degenerative disc disease), cervical    Depression    Diverticulitis    Dyspnea    Edema of both legs    GERD (gastroesophageal reflux disease)    Gout    H/O mastectomy 2014   per patient   H/O: cesarean section 1987   per patient report   H/O: hysterectomy 1996   per patient   Hydradenitis    Hypertension    Hypothyroidism    Neuropathy    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HCC)    Parkinson's disease (HCC)    Personal history of chemotherapy    Personal history of radiation therapy    Ruptured cervical disc 2002   per patient    Sleep apnea    cpap machine   Super obese    Tachycardia     Current Outpatient Rx   Order #:  [id number] Class: Historical Med   Order #: [id number] Class: Normal   Order #: [id number] Class: Historical Med   Order #: [id number] Class: Historical Med   Order #: [id number] Class: Historical Med   Order #: [id number] Class: Normal   Order #: [id number] Class: Historical Med   Order #: [id number] Class: Historical Med   Order #: [id number] Class: Historical Med   Order #: [id number] Class: Historical Med   Order #: [id number] Class: Print   Order #: [id number] Class: Historical Med   Order #: [id number] Class: Historical Med   Order #: [id number] Class: Historical Med   Order #: [id number] Class: Historical Med   Order #: [id number] Class: No Print   Order #: [id number] Class: No Print    Past Surgical History:  Procedure Laterality Date   ABDOMINAL HYSTERECTOMY     APPLICATION OF INTRAOPERATIVE CT SCAN N/A 07/07/2023   Procedure: APPLICATION OF INTRAOPERATIVE CT SCAN;  Surgeon: [name], MD;  Location: [facility] ORS;  Service: Neurosurgery;  Laterality: N/A;   BACK SURGERY  2002   plate and 2 screws in neck   BREAST BIOPSY Right 1991,2014   Positive   BREAST CYST ASPIRATION Left    BREAST SURGERY Right 2014   mastectomy  CATARACT EXTRACTION W/PHACO Left 05/17/2019   Procedure: CATARACT EXTRACTION PHACO AND INTRAOCULAR LENS PLACEMENT (IOC);  Surgeon: Myrna Adine Anes, MD;  Location: [facility] ORS;  Service: Ophthalmology;  Laterality: Left;  US  00:28 CDE 2.15 FLUID PACK LOT # [id number]   CATARACT EXTRACTION W/PHACO Right 06/14/2019   Procedure: CATARACT EXTRACTION PHACO AND INTRAOCULAR LENS PLACEMENT (IOC) RIGHT;  Surgeon: Myrna Adine Anes, MD;  Location: [facility] ORS;  Service: Ophthalmology;  Laterality: Right;  US  00:35.0 CDE 2.37 Fluid Pack Lot #[id number]   CESAREAN SECTION     CHOLECYSTECTOMY  04/09/2012   COLONOSCOPY WITH PROPOFOL  N/A 06/22/2015   Procedure: COLONOSCOPY WITH PROPOFOL ;  Surgeon: Lamar ONEIDA Holmes, MD;  Location: [facility] ENDOSCOPY;  Service: Endoscopy;  Laterality: N/A;   COLONOSCOPY WITH  PROPOFOL  N/A 05/25/2021   Procedure: COLONOSCOPY WITH PROPOFOL ;  Surgeon: Maryruth Ole ONEIDA, MD;  Location: [facility] ENDOSCOPY;  Service: Endoscopy;  Laterality: N/A;   ESOPHAGOGASTRODUODENOSCOPY     ESOPHAGOGASTRODUODENOSCOPY N/A 05/25/2021   Procedure: ESOPHAGOGASTRODUODENOSCOPY (EGD);  Surgeon: Maryruth Ole ONEIDA, MD;  Location: [facility] ENDOSCOPY;  Service: Endoscopy;  Laterality: N/A;   EYE SURGERY     LAPAROSCOPIC GASTRIC BYPASS     MASTECTOMY Right    MASTECTOMY MODIFIED RADICAL     mastectomy partial      lumpectomy   NEUROPLASTY / TRANSPOSITION ULNAR NERVE AT ELBOW     OOPHORECTOMY     PORT-A-CATH REMOVAL Left 05/13/2015   Procedure: REMOVAL PORT-A-CATH LEFT CHEST ;  Surgeon: Charlie FORBES Fell, MD;  Location: [facility] ORS;  Service: General;  Laterality: Left;   REDUCTION MAMMAPLASTY Left    REVERSE SHOULDER ARTHROPLASTY Right 12/03/2020   Procedure: REVERSE SHOULDER ARTHROPLASTY;  Surgeon: Edie Norleen PARAS, MD;  Location: [facility] ORS;  Service: Orthopedics;  Laterality: Right;   ROUX-EN-Y GASTRIC BYPASS  01/26/2009   SHOULDER ARTHROSCOPY WITH OPEN ROTATOR CUFF REPAIR Right 01/03/2017   Procedure: SHOULDER ARTHROSCOPY WITH OPEN ROTATOR CUFF REPAIR;  Surgeon: Norleen PARAS Edie, MD;  Location: [facility] ORS;  Service: Orthopedics;  Laterality: Right;   SHOULDER ARTHROSCOPY WITH SUBACROMIAL DECOMPRESSION, ROTATOR CUFF REPAIR AND BICEP TENDON REPAIR Right 01/03/2017   Procedure: SHOULDER ARTHROSCOPY WITH SUBACROMIAL DECOMPRESSION, ROTATOR CUFF REPAIR AND BICEP TENDON REPAIR;  Surgeon: Norleen PARAS Edie, MD;  Location: [facility] ORS;  Service: Orthopedics;  Laterality: Right;  Limited debridement   SPINE SURGERY      Physical Exam   Triage Vital Signs: ED Triage Vitals  Encounter Vitals Group     BP 05/26/24 2122 126/63     Girls Systolic BP Percentile --      Girls Diastolic BP Percentile --      Boys Systolic BP Percentile --      Boys Diastolic BP Percentile --      Pulse Rate 05/26/24 2122 (!) 107     Resp 05/26/24  2122 (!) 31     Temp 05/26/24 2122 98 F (36.7 C)     Temp Source 05/26/24 2122 Oral     SpO2 05/26/24 2122 99 %     Weight 05/26/24 2124 248 lb 8 oz (112.7 kg)     Height 05/26/24 2124 5' 4 (1.626 m)     Head Circumference --      Peak Flow --      Pain Score 05/26/24 2123 7     Pain Loc --      Pain Education --      Exclude from Growth Chart --     Most recent vital signs: Vitals:  05/26/24 2122  BP: 126/63  Pulse: (!) 107  Resp: (!) 31  Temp: 98 F (36.7 C)  SpO2: 99%    General: Awake, no distress.  CV:  Good peripheral perfusion.  Tachycardia heart rate 110 Resp:  Normal effort.  Clear lungs Abd:  No distention.  Soft with suprapubic tenderness Other:  2+ edema bilateral lower extremities.  No calf tenderness.   ED Results / Procedures / Treatments   Labs (all labs ordered are listed, but only abnormal results are displayed) Labs Reviewed  COMPREHENSIVE METABOLIC PANEL WITH GFR - Abnormal; Notable for the following components:      Result Value   Sodium 131 (*)    Chloride 97 (*)    Glucose, Bld 119 (*)    Creatinine, Ser 1.02 (*)    Calcium  7.5 (*)    Total Protein 6.4 (*)    Albumin 2.7 (*)    AST 63 (*)    Total Bilirubin 1.3 (*)    GFR, Estimated 57 (*)    All other components within normal limits  CBC WITH DIFFERENTIAL/PLATELET - Abnormal; Notable for the following components:   WBC 22.5 (*)    RBC 3.64 (*)    Hemoglobin 10.9 (*)    HCT 32.2 (*)    RDW 16.5 (*)    Neutro Abs 18.3 (*)    Monocytes Absolute 2.6 (*)    Abs Immature Granulocytes 0.14 (*)    All other components within normal limits  BRAIN NATRIURETIC PEPTIDE - Abnormal; Notable for the following components:   B Natriuretic Peptide 289.5 (*)    All other components within normal limits  TROPONIN I (HIGH SENSITIVITY) - Abnormal; Notable for the following components:   Troponin I (High Sensitivity) 115 (*)    All other components within normal limits  CULTURE, BLOOD (ROUTINE X  2)  CULTURE, BLOOD (ROUTINE X 2)  RESP PANEL BY RT-PCR (RSV, FLU A&B, COVID)  RVPGX2  URINALYSIS, W/ REFLEX TO CULTURE (INFECTION SUSPECTED)  LACTIC ACID, PLASMA  LACTIC ACID, PLASMA  PROTIME-INR  TROPONIN I (HIGH SENSITIVITY)     EKG Interpreted by me Sinus tachycardia rate 105.  Left axis, normal intervals.  Normal QRS ST segments and T waves.   RADIOLOGY Chest x-ray interpreted by me, unremarkable.  No effusion edema or consolidation.  Radiology report read   PROCEDURES:  .Critical Care  Performed by: Viviann Pastor, MD Authorized by: Viviann Pastor, MD   Critical care provider statement:    Critical care time (minutes):  35   Critical care time was exclusive of:  Separately billable procedures and treating other patients   Critical care was necessary to treat or prevent imminent or life-threatening deterioration of the following conditions:  Sepsis   Critical care was time spent personally by me on the following activities:  Development of treatment plan with patient or surrogate, discussions with consultants, evaluation of patient's response to treatment, examination of patient, obtaining history from patient or surrogate, ordering and performing treatments and interventions, ordering and review of laboratory studies, ordering and review of radiographic studies, pulse oximetry, re-evaluation of patient's condition and review of old charts   Care discussed with: admitting provider      MEDICATIONS ORDERED IN ED: Medications  sodium chloride  0.9 % bolus 1,000 mL (1,000 mLs Intravenous New Bag/Given 05/26/24 2309)     IMPRESSION / MDM / ASSESSMENT AND PLAN / ED COURSE  I reviewed the triage vital signs and the nursing notes.  DDx: UTI,  sepsis, AKI, dehydration, electrolyte derangement, COVID, influenza, DVT  Patient's presentation is most consistent with acute presentation with potential threat to life or bodily function.  Patient presents with generalized  weakness in the setting of Parkinson's disease and UTI symptoms.  She is tachycardic and tachypneic, and labs show leukocytosis of 22,000.  Troponin of 115 is nonspecific, with trend.  Due to her comorbidities and functional decline, will need to admit for sepsis management.       FINAL CLINICAL IMPRESSION(S) / ED DIAGNOSES   Final diagnoses:  Sepsis, due to unspecified organism, unspecified whether acute organ dysfunction present [facility])  Parkinson's disease with dyskinesia and fluctuating manifestations (HCC)     Rx / DC Orders   ED Discharge Orders     None        Note:  This document was prepared using Dragon voice recognition software and may include unintentional dictation errors.   Viviann Pastor, MD 05/26/24 [phone]

## 2024-05-26 NOTE — ED Triage Notes (Signed)
 Pt arrived by EMS with complaints of possible UTI.  Per Aide at home, PCP put Pt on Keflex  and she took her first round of 3 doses today.  Pt has Parkinsons and Lymphedema Right leg worse than Left.  EMS:  T 97.7 RA 95 129/70 HR 100

## 2024-05-27 DIAGNOSIS — E538 Deficiency of other specified B group vitamins: Secondary | ICD-10-CM | POA: Diagnosis present

## 2024-05-27 DIAGNOSIS — I10 Essential (primary) hypertension: Secondary | ICD-10-CM | POA: Diagnosis not present

## 2024-05-27 DIAGNOSIS — R531 Weakness: Secondary | ICD-10-CM

## 2024-05-27 DIAGNOSIS — F32A Depression, unspecified: Secondary | ICD-10-CM | POA: Diagnosis present

## 2024-05-27 DIAGNOSIS — G4733 Obstructive sleep apnea (adult) (pediatric): Secondary | ICD-10-CM | POA: Diagnosis present

## 2024-05-27 DIAGNOSIS — R7989 Other specified abnormal findings of blood chemistry: Secondary | ICD-10-CM | POA: Diagnosis present

## 2024-05-27 DIAGNOSIS — I890 Lymphedema, not elsewhere classified: Secondary | ICD-10-CM | POA: Diagnosis present

## 2024-05-27 DIAGNOSIS — I5033 Acute on chronic diastolic (congestive) heart failure: Secondary | ICD-10-CM

## 2024-05-27 DIAGNOSIS — A4151 Sepsis due to Escherichia coli [E. coli]: Secondary | ICD-10-CM | POA: Diagnosis present

## 2024-05-27 DIAGNOSIS — N390 Urinary tract infection, site not specified: Secondary | ICD-10-CM | POA: Diagnosis present

## 2024-05-27 DIAGNOSIS — Z6841 Body Mass Index (BMI) 40.0 and over, adult: Secondary | ICD-10-CM | POA: Diagnosis not present

## 2024-05-27 DIAGNOSIS — I081 Rheumatic disorders of both mitral and tricuspid valves: Secondary | ICD-10-CM | POA: Diagnosis present

## 2024-05-27 DIAGNOSIS — M109 Gout, unspecified: Secondary | ICD-10-CM | POA: Diagnosis present

## 2024-05-27 DIAGNOSIS — K219 Gastro-esophageal reflux disease without esophagitis: Secondary | ICD-10-CM | POA: Diagnosis present

## 2024-05-27 DIAGNOSIS — E66813 Obesity, class 3: Secondary | ICD-10-CM | POA: Diagnosis present

## 2024-05-27 DIAGNOSIS — I480 Paroxysmal atrial fibrillation: Secondary | ICD-10-CM | POA: Insufficient documentation

## 2024-05-27 DIAGNOSIS — E871 Hypo-osmolality and hyponatremia: Secondary | ICD-10-CM | POA: Diagnosis present

## 2024-05-27 DIAGNOSIS — E876 Hypokalemia: Secondary | ICD-10-CM | POA: Diagnosis present

## 2024-05-27 DIAGNOSIS — G20B1 Parkinson's disease with dyskinesia, without mention of fluctuations: Secondary | ICD-10-CM | POA: Diagnosis present

## 2024-05-27 DIAGNOSIS — E039 Hypothyroidism, unspecified: Secondary | ICD-10-CM | POA: Diagnosis present

## 2024-05-27 DIAGNOSIS — Z79890 Hormone replacement therapy (postmenopausal): Secondary | ICD-10-CM | POA: Diagnosis not present

## 2024-05-27 DIAGNOSIS — Z79899 Other long term (current) drug therapy: Secondary | ICD-10-CM | POA: Diagnosis not present

## 2024-05-27 DIAGNOSIS — Z7901 Long term (current) use of anticoagulants: Secondary | ICD-10-CM | POA: Diagnosis not present

## 2024-05-27 DIAGNOSIS — G629 Polyneuropathy, unspecified: Secondary | ICD-10-CM | POA: Diagnosis present

## 2024-05-27 DIAGNOSIS — I110 Hypertensive heart disease with heart failure: Secondary | ICD-10-CM | POA: Diagnosis present

## 2024-05-27 DIAGNOSIS — Z1152 Encounter for screening for COVID-19: Secondary | ICD-10-CM | POA: Diagnosis not present

## 2024-05-27 LAB — BASIC METABOLIC PANEL WITH GFR
Anion gap: 9 (ref 5–15)
BUN: 19 mg/dL (ref 8–23)
CO2: 24 mmol/L (ref 22–32)
Calcium: 7.1 mg/dL — ABNORMAL LOW (ref 8.9–10.3)
Chloride: 100 mmol/L (ref 98–111)
Creatinine, Ser: 0.81 mg/dL (ref 0.44–1.00)
GFR, Estimated: >60 mL/min (ref >60)
Glucose, Bld: 146 mg/dL — ABNORMAL HIGH (ref 70–99)
Potassium: 3.1 mmol/L — ABNORMAL LOW (ref 3.5–5.1)
Sodium: 133 mmol/L — ABNORMAL LOW (ref 135–145)

## 2024-05-27 LAB — BLOOD CULTURE ID PANEL (REFLEXED) - BCID2

## 2024-05-27 LAB — TROPONIN I (HIGH SENSITIVITY): Troponin I (High Sensitivity): 122 ng/L (ref <18)

## 2024-05-27 LAB — URINALYSIS, W/ REFLEX TO CULTURE (INFECTION SUSPECTED)
Bilirubin Urine: NEGATIVE
Glucose, UA: NEGATIVE mg/dL
Ketones, ur: NEGATIVE mg/dL
Nitrite: NEGATIVE
Protein, ur: NEGATIVE mg/dL
Specific Gravity, Urine: 1.013 (ref 1.005–1.030)
pH: 5 (ref 5.0–8.0)

## 2024-05-27 LAB — CBC
HCT: 29.7 % — ABNORMAL LOW (ref 36.0–46.0)
Hemoglobin: 10 g/dL — ABNORMAL LOW (ref 12.0–15.0)
MCH: 29.3 pg (ref 26.0–34.0)
MCHC: 33.7 g/dL (ref 30.0–36.0)
MCV: 87.1 fL (ref 80.0–100.0)
Platelets: 214 K/uL (ref 150–400)
RBC: 3.41 MIL/uL — ABNORMAL LOW (ref 3.87–5.11)
RDW: 16.3 % — ABNORMAL HIGH (ref 11.5–15.5)
WBC: 18.2 K/uL — ABNORMAL HIGH (ref 4.0–10.5)
nRBC: 0 % (ref 0.0–0.2)

## 2024-05-27 LAB — PROTIME-INR
INR: 1.6 — ABNORMAL HIGH (ref 0.8–1.2)
Prothrombin Time: 20.4 s — ABNORMAL HIGH (ref 11.4–15.2)

## 2024-05-27 MED ORDER — ENSURE PLUS HIGH PROTEIN PO LIQD
237.0000 mL | Freq: Two times a day (BID) | ORAL | Status: DC
  Administered 2024-05-28 – 2024-06-01 (×5): 237 mL via ORAL

## 2024-05-27 MED ORDER — VITAMIN B-12 1000 MCG PO TABS
1000.0000 ug | ORAL_TABLET | Freq: Every day | ORAL | Status: DC
  Administered 2024-05-27 – 2024-06-01 (×6): 1000 ug via ORAL
  Filled 2024-05-27 (×4): qty 1
  Filled 2024-05-27: qty 2
  Filled 2024-05-27: qty 1

## 2024-05-27 MED ORDER — WARFARIN SODIUM 7.5 MG PO TABS
7.5000 mg | ORAL_TABLET | Freq: Once | ORAL | Status: AC
  Administered 2024-05-27: 7.5 mg via ORAL
  Filled 2024-05-27: qty 1

## 2024-05-27 MED ORDER — TRAZODONE HCL 50 MG PO TABS
25.0000 mg | ORAL_TABLET | Freq: Every evening | ORAL | Status: DC | PRN
  Administered 2024-05-29 – 2024-05-30 (×2): 25 mg via ORAL
  Filled 2024-05-27 (×2): qty 1

## 2024-05-27 MED ORDER — ACETAMINOPHEN 650 MG RE SUPP: 650.0000 mg | Freq: Four times a day (QID) | RECTAL | Status: DC | PRN

## 2024-05-27 MED ORDER — ALLOPURINOL 100 MG PO TABS
300.0000 mg | ORAL_TABLET | Freq: Every day | ORAL | Status: DC
  Administered 2024-05-27 – 2024-06-01 (×6): 300 mg via ORAL
  Filled 2024-05-27: qty 1
  Filled 2024-05-27 (×5): qty 3

## 2024-05-27 MED ORDER — POTASSIUM CHLORIDE CRYS ER 10 MEQ PO TBCR: 10.0000 meq | EXTENDED_RELEASE_TABLET | Freq: Every day | ORAL | Status: DC | PRN

## 2024-05-27 MED ORDER — ACETAMINOPHEN 325 MG PO TABS
650.0000 mg | ORAL_TABLET | Freq: Four times a day (QID) | ORAL | Status: DC | PRN
  Administered 2024-05-27 – 2024-06-01 (×6): 650 mg via ORAL
  Filled 2024-05-27 (×6): qty 2

## 2024-05-27 MED ORDER — METHOCARBAMOL 500 MG PO TABS
500.0000 mg | ORAL_TABLET | Freq: Four times a day (QID) | ORAL | Status: DC | PRN
  Administered 2024-05-28 – 2024-05-29 (×2): 500 mg via ORAL
  Filled 2024-05-27 (×2): qty 1

## 2024-05-27 MED ORDER — DULOXETINE HCL 30 MG PO CPEP
60.0000 mg | ORAL_CAPSULE | Freq: Two times a day (BID) | ORAL | Status: DC
  Administered 2024-05-27 – 2024-06-01 (×11): 60 mg via ORAL
  Filled 2024-05-27: qty 2
  Filled 2024-05-27: qty 1
  Filled 2024-05-27 (×9): qty 2

## 2024-05-27 MED ORDER — PANTOPRAZOLE SODIUM 40 MG PO TBEC
40.0000 mg | DELAYED_RELEASE_TABLET | Freq: Every day | ORAL | Status: DC
  Administered 2024-05-27 – 2024-06-01 (×6): 40 mg via ORAL
  Filled 2024-05-27 (×6): qty 1

## 2024-05-27 MED ORDER — WARFARIN - PHARMACIST DOSING INPATIENT
Freq: Every day | Status: DC
  Administered 2024-05-30 – 2024-05-31 (×2): 1
  Filled 2024-05-27: qty 1

## 2024-05-27 MED ORDER — SODIUM CHLORIDE 0.9 % IV SOLN: 1.0000 g | INTRAVENOUS | Status: DC

## 2024-05-27 MED ORDER — WARFARIN SODIUM 3 MG PO TABS: 3.0000 mg | ORAL_TABLET | Freq: Every day | ORAL | Status: DC

## 2024-05-27 MED ORDER — MIRABEGRON ER 25 MG PO TB24
25.0000 mg | ORAL_TABLET | Freq: Every day | ORAL | Status: DC
  Administered 2024-05-27 – 2024-06-01 (×6): 25 mg via ORAL
  Filled 2024-05-27 (×6): qty 1

## 2024-05-27 MED ORDER — METOPROLOL TARTRATE 50 MG PO TABS
100.0000 mg | ORAL_TABLET | Freq: Two times a day (BID) | ORAL | Status: DC
  Administered 2024-05-27 – 2024-06-01 (×11): 100 mg via ORAL
  Filled 2024-05-27 (×2): qty 2
  Filled 2024-05-27: qty 4
  Filled 2024-05-27 (×8): qty 2

## 2024-05-27 MED ORDER — ENOXAPARIN SODIUM 60 MG/0.6ML IJ SOSY
55.0000 mg | PREFILLED_SYRINGE | INTRAMUSCULAR | Status: DC
Start: 2024-05-27 — End: 2024-05-27

## 2024-05-27 MED ORDER — ONDANSETRON HCL 4 MG PO TABS: 4.0000 mg | ORAL_TABLET | Freq: Four times a day (QID) | ORAL | Status: DC | PRN

## 2024-05-27 MED ORDER — BACLOFEN 10 MG PO TABS
5.0000 mg | ORAL_TABLET | Freq: Two times a day (BID) | ORAL | Status: DC
  Administered 2024-05-27 – 2024-06-01 (×11): 10 mg via ORAL
  Filled 2024-05-27 (×11): qty 1

## 2024-05-27 MED ORDER — MAGNESIUM OXIDE 400 MG PO TABS
400.0000 mg | ORAL_TABLET | Freq: Every day | ORAL | Status: DC
  Administered 2024-05-27 – 2024-06-01 (×6): 400 mg via ORAL
  Filled 2024-05-27 (×12): qty 1

## 2024-05-27 MED ORDER — FUROSEMIDE 10 MG/ML IJ SOLN
40.0000 mg | Freq: Two times a day (BID) | INTRAMUSCULAR | Status: DC
  Administered 2024-05-27 – 2024-06-01 (×11): 40 mg via INTRAVENOUS
  Filled 2024-05-27 (×11): qty 4

## 2024-05-27 MED ORDER — OYSTER SHELL CALCIUM/D3 500-5 MG-MCG PO TABS
1.0000 | ORAL_TABLET | Freq: Two times a day (BID) | ORAL | Status: DC
  Administered 2024-05-27 – 2024-06-01 (×11): 1 via ORAL
  Filled 2024-05-27 (×11): qty 1

## 2024-05-27 MED ORDER — GABAPENTIN 300 MG PO CAPS
300.0000 mg | ORAL_CAPSULE | Freq: Four times a day (QID) | ORAL | Status: DC
  Administered 2024-05-27 – 2024-06-01 (×21): 300 mg via ORAL
  Filled 2024-05-27 (×21): qty 1

## 2024-05-27 MED ORDER — MAGNESIUM HYDROXIDE 400 MG/5ML PO SUSP: 30.0000 mL | Freq: Every day | ORAL | Status: DC | PRN

## 2024-05-27 MED ORDER — LEVOTHYROXINE SODIUM 50 MCG PO TABS
50.0000 ug | ORAL_TABLET | Freq: Every day | ORAL | Status: DC
  Administered 2024-05-27 – 2024-06-01 (×6): 50 ug via ORAL
  Filled 2024-05-27 (×6): qty 1

## 2024-05-27 MED ORDER — FUROSEMIDE 10 MG/ML IJ SOLN
60.0000 mg | Freq: Once | INTRAMUSCULAR | Status: AC
  Administered 2024-05-27: 60 mg via INTRAVENOUS
  Filled 2024-05-27: qty 8

## 2024-05-27 MED ORDER — ONDANSETRON HCL 4 MG/2ML IJ SOLN: 4.0000 mg | Freq: Four times a day (QID) | INTRAMUSCULAR | Status: DC | PRN

## 2024-05-27 MED ORDER — WARFARIN SODIUM 6 MG PO TABS: 6.0000 mg | ORAL_TABLET | Freq: Every day | ORAL | Status: DC

## 2024-05-27 MED ORDER — SODIUM CHLORIDE 0.9 % IV SOLN
2.0000 g | INTRAVENOUS | Status: DC
  Administered 2024-05-27 – 2024-05-29 (×3): 2 g via INTRAVENOUS
  Filled 2024-05-27 (×3): qty 20

## 2024-05-27 NOTE — Hospital Course (Signed)
 Hospital course / significant events:   HPI: Suzanne Dixon is a 73 y.o. African-American female with medical history significant for paroxysmal atrial fibrillation, vitamin B12 deficiency, depression, GERD, gout, hypertension, hypothyroidism, and OSA, who presented to the emergency room with acute onset of generalized weakness with loss of appetite and increased lymphedema.  She has been recently treated for UTI with p.o. Keflex .  She continues to have dysuria, urinary frequency and urgency.   09/08: admitted to hospitalist for UTI and concern for CHF> BCx (+)Ecoli.  09/09: continuing abx pending susceptibilities     Consultants:  None  Procedures/Surgeries: none      ASSESSMENT & PLAN:   BCx x1 (+)EColi  Pending susceptibilities  UCx no growth but she was on abx prior to collection of specimen   Hypokalemia Replace as needed Monitor BMP  Lymphedema Acute on chronic HFpEF  Essential HTN  Most recent 2D echo revealed an EF of 50 to 55% with grade 1 diastolic function, revealed mitral valve regurgitation and moderate tricuspid valve regurgitation.  Diuresing  I&O Other rx: metoprolol . On coumadin  so no ASA>   Paroxysmal Afib Coumadin   Metoprolol    GERD PPI  Hypothyroid Synthroid   Gout Allopurinol   Anx/Dep Home Cymbalta  trazodone  prn  Neuropathy, pain Gabapentin , robaxin       Class 3 morbid obesity based on BMI: Body mass index is 42.65 kg/m.SABRA Significantly low or high BMI is associated with higher medical risk.  Underweight - under 18  overweight - 25 to 29 obese - 30 or more Class 1 obesity: BMI of 30.0 to 34 Class 2 obesity: BMI of 35.0 to 39 Class 3 obesity: BMI of 40.0 to 49 Super Morbid Obesity: BMI 50-59 Super-super Morbid Obesity: BMI 60+ Healthy nutrition and physical activity advised as adjunct to other disease management and risk reduction treatments    DVT prophylaxis: coumadin  IV fluids: no continuous IV fluids  Nutrition:  regular Central lines / other devices: none  Code Status: FULL CODE ACP documentation reviewed:  none on file in [facility]  TOC needs: pt declines SNF will need at least [facility]  Medical barriers to dispo: culture results. Expected medical readiness for discharge pending cultures / abx .

## 2024-05-27 NOTE — Evaluation (Addendum)
 Physical Therapy Evaluation Patient Details Name: Suzanne Dixon MRN: [id number] DOB: 02-18-1951 Today's Date: 05/27/2024  History of Present Illness  inda B Bertha is a 73 y.o. African-American female with medical history significant for paroxysmal atrial fibrillation, vitamin B12 deficiency, depression, GERD, gout, hypertension, hypothyroidism, and OSA, who presented to the emergency room with acute onset of generalized weakness with loss of appetite and increased lymphedema. Pt with Recetn Hx of Spine surgery in October 2024. Pt was receiving Home PT/OT until this ED visit.  Clinical Impression  Pt received in Bed agreeable to participate in PT interventions. Pt and O x 4. Pleasant and cooperative. Pt denied symptoms of Nausea, spinning or vomiting. Therefore, PT continued with Evaluation for generalized weakness. Pt remained asymptomatic through out the session except pain in R ankle. Pt uses W/C and Electric scooter at home. Transfers [place] prior to this Hospital visit. Has HH aide and was receiving HHPT/OT. PT assessment revealed pt has increased pain in R ankle/Foot which 7/10 constant and with touch. Difficulties with WB. Pt needs CGA to Min assist with Supine to sit to stand and take 3 side steps. Mod to max assist to BLE for sit to supine. Pt has generalized edema  greatest in BLE/PT will continue in acute and pt will benefit from continued rehab beyond Acute to return to [place] at W/C level confirmed by [facility] score. Pt agreed with POC.       If plan is discharge home, recommend the following: A lot of help with walking and/or transfers;A lot of help with bathing/dressing/bathroom;Assistance with cooking/housework;Direct supervision/assist for medications management;Assist for transportation;Help with stairs or ramp for entrance   Can travel by private vehicle   No    Equipment Recommendations Hoyer lift  Recommendations for Other Services       Functional Status Assessment  Patient has had a recent decline in their functional status and demonstrates the ability to make significant improvements in function in a reasonable and predictable amount of time.     Precautions / Restrictions Precautions Precautions: Fall Recall of Precautions/Restrictions: Intact Restrictions Weight Bearing Restrictions Per Provider Order: No      Mobility  Bed Mobility Overal bed mobility: Needs Assistance Bed Mobility: Supine to Sit, Sit to Supine     Supine to sit: Contact guard Sit to supine: Mod assist, Max assist   General bed mobility comments: Mod to max assist to BLE.    Transfers Overall transfer level: Needs assistance Equipment used: Rolling walker (2 wheels) Transfers: Sit to/from Stand Sit to Stand: Min assist, Contact guard assist           General transfer comment: slow and with pain in R foot.    Ambulation/Gait Ambulation/Gait assistance: Min assist Gait Distance (Feet): 2 Feet Assistive device: Rolling walker (2 wheels) Gait Pattern/deviations: Shuffle       General Gait Details: 3 shuffled side steps at bed side unable to clear R>L foot.  Stairs            Wheelchair Mobility     Tilt Bed    Modified Rankin (Stroke Patients Only)       Balance Overall balance assessment: Needs assistance Sitting-balance support: Bilateral upper extremity supported Sitting balance-Leahy Scale: Good Sitting balance - Comments: relies on FWW heavily.   Standing balance support: Bilateral upper extremity supported Standing balance-Leahy Scale: Good Standing balance comment: No LOB noted.  Pertinent Vitals/Pain Pain Assessment Pain Assessment: 0-10 Pain Score: 7  Pain Location: Low back and R ankle/Foot Pain Descriptors / Indicators: Aching, Constant Pain Intervention(s): Monitored during session    Home Living Family/patient expects to be discharged to:: Private residence Living  Arrangements: Alone Available Help at Discharge: Family;Available PRN/intermittently;Personal care attendant Type of Home: House Home Access: Ramped entrance       Home Layout: One level Home Equipment: Agricultural consultant (2 wheels);Cane - single point;Wheelchair - manual;Shower seat;Hospital bed;Electric scooter      Prior Function Prior Level of Function : Needs assist             Mobility Comments: Pt Mod I with transfers to W/C and [employer]. HHaide x 4 hours for, cleaning, bathing, minimal cooking and house chores. ADLs Comments: HHaide, OT assist with bathing and dressing.     Extremity/Trunk Assessment   Upper Extremity Assessment Upper Extremity Assessment: Generalized weakness    Lower Extremity Assessment Lower Extremity Assessment: Generalized weakness       Communication   Communication Communication: No apparent difficulties    Cognition Arousal: Alert Behavior During Therapy: WFL for tasks assessed/performed   PT - Cognitive impairments: No apparent impairments                         Following commands: Intact       Cueing Cueing Techniques: Verbal cues     General Comments      Exercises     Assessment/Plan    PT Assessment Patient needs continued PT services  PT Problem List Decreased strength;Decreased range of motion;Decreased activity tolerance;Pain;Decreased mobility       PT Treatment Interventions Gait training;Functional mobility training;Therapeutic activities;Therapeutic exercise;Balance training;Neuromuscular re-education;Wheelchair mobility training;Patient/family education    PT Goals (Current goals can be found in the Care Plan section)  Acute Rehab PT Goals Patient Stated Goal:  I want to get stronger so I can go home alone. PT Goal Formulation: With patient Time For Goal Achievement: 06/10/24 Potential to Achieve Goals: Good    Frequency Min 1X/week     Co-evaluation                AM-PAC PT 6 Clicks Mobility  Outcome Measure Help needed turning from your back to your side while in a flat bed without using bedrails?: A Little Help needed moving from lying on your back to sitting on the side of a flat bed without using bedrails?: A Little Help needed moving to and from a bed to a chair (including a wheelchair)?: A Lot Help needed standing up from a chair using your arms (e.g., wheelchair or bedside chair)?: A Little Help needed to walk in hospital room?: A Little Help needed climbing 3-5 steps with a railing? : Total 6 Click Score: 15    End of Session Equipment Utilized During Treatment: Gait belt Activity Tolerance: Patient limited by pain Patient left: in bed Nurse Communication: Mobility status PT Visit Diagnosis: History of falling (Z91.81);Muscle weakness (generalized) (M62.81);Difficulty in walking, not elsewhere classified (R26.2);Pain Pain - Right/Left: Right Pain - part of body: Ankle and joints of foot    Time: 1127-1200 PT Time Calculation (min) (ACUTE ONLY): 33 min   Charges:   PT Evaluation $PT Eval Low Complexity: 1 Low PT Treatments $Therapeutic Activity: 8-22 mins PT General Charges $$ ACUTE PT VISIT: 1 Visit         Aleana Fifita PT DPT 2:15 PM,05/27/24  Siyona Coto Rosina  PT DPT 2:20 PM,05/27/24

## 2024-05-27 NOTE — Progress Notes (Deleted)
 PHARMACIST - PHYSICIAN COMMUNICATION  CONCERNING:  Enoxaparin  (Lovenox ) for DVT Prophylaxis    RECOMMENDATION: Patient was prescribed enoxaprin 40mg  q24 hours for VTE prophylaxis.   Filed Weights   05/26/24 2124  Weight: 112.7 kg (248 lb 8 oz)    Body mass index is 42.65 kg/m.  Estimated Creatinine Clearance: 58.6 mL/min (A) (by C-G formula based on SCr of 1.02 mg/dL (H)).   Based on [facility] policy patient is candidate for enoxaparin  0.5mg /kg TBW SQ every 24 hours based on BMI being >30.  DESCRIPTION: Pharmacy has adjusted enoxaparin  dose per [facility] policy.  Patient is now receiving enoxaparin  0.5 mg/kg every 24 hours   Rankin CANDIE Dills, PharmD, [facility] 05/27/2024 3:00 AM

## 2024-05-27 NOTE — Assessment & Plan Note (Signed)
-   Will continue Coumadin .

## 2024-05-27 NOTE — TOC Initial Note (Addendum)
 Transition of Care [facility]) - Initial/Assessment Note    Patient Details  Name: Suzanne Dixon MRN: [id number] Date of Birth: 21-Apr-1951  Transition of Care [facility]) CM/SW Contact:    Suzanne Cooks, RN Phone Number: 05/27/2024, 10:54 AM  Clinical Narrative:                 This CM  spoke with pt introduced role and completed Initial assessement. Pt A&Ox4 Pt reports living in a Single Family 1 story home with  ramp entry.Pt has family support provided by daughter Suzanne Dixon that live in [place] per pt.  [facility] [address] [facility] [place] & PCP is Dr. OPHELIA JINNY SAGE Dixon at the [facility] has DME reported at home that includes a RW,Electric W/C ,Rollator & a C-pap machine she reports not using x 7yrs . Pt reports not  being in SNF prior to  this admission, sharing her last Rehab admission was 10/24-12/24 at the [facility] in [place].Pt reports having a private pay [facility]  Aide that assist her M-W-F with ADL'S, cooking, cleaning , and medication administration that pt shared her daughter Suzanne Dixon private pays for .  Pt confirms community resources with [employer]  TOC will cont to follow pt's dc planning / care coordination during hospital stay and update as applicable.   12:15p-This CM confirmed with Suzanne Dixon liaison at [facility]  pt has open Suzanne Dixon services for PT/OT. 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 07/06/2023   Acute cystitis 06/29/2023   Acute back pain 06/29/2023    Pressure injury of skin 06/20/2022   Sepsis secondary to UTI (HCC) 06/19/2022   AMS (altered mental status) 06/19/2022   Parkinsonism (HCC) 05/30/2022   Acute metabolic encephalopathy 05/20/2022   Thoracic degenerative disc disease 08/24/2021   Aortic atherosclerosis (HCC) 08/23/2021   COVID-19 virus infection 06/19/2021   CVA (cerebral vascular accident) (HCC) 02/01/2021   Hypothyroidism    Left leg cellulitis    Acquired thrombophilia (HCC) 12/10/2020   Status post reverse total shoulder replacement, 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   Lymphedema 07/17/2020   Right rotator cuff tear arthropathy 04/07/2020   Edema of both legs 12/31/2019   Polyneuropathy associated with underlying disease (HCC) 12/31/2019   Swelling of limb 12/31/2019   Hematuria, microscopic 06/07/2018   Age-related osteoporosis without current pathological fracture 12/08/2017   Rotator cuff arthropathy, right 01/03/2017   Biceps tendinitis of right upper extremity 12/05/2016   Complete tear of right rotator cuff 12/05/2016   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   History of breast cancer in female 06/02/2016   Morbid obesity (HCC) 12/01/2015   Allergy 04/01/2015   Arthritis 04/01/2015   Cancer (HCC) 04/01/2015   Edema leg 04/01/2015   Gout 04/01/2015   Hidradenitis 04/01/2015   Elevated lymphocyte count 04/01/2015   Disorder of peripheral nervous system 04/01/2015   Apnea, sleep 04/01/2015   Avitaminosis D 04/01/2015   Breast cancer (HCC) 04/01/2015   Hypertension  04/15/2014   B12 deficiency 02/24/2014   AF (paroxysmal atrial fibrillation) (HCC) 02/17/2014   Obesity (BMI 30-39.9) 06/04/2012   Depression, major, recurrent, moderate (HCC) 03/26/2012   PCP:  Suzanne Dixon Pharmacy:   [phone] [address], [place] - [address] [place] [address] Phone: [phone] Fax: [phone]  [facility] - [address] [place], [place] - [address] [place] [address] Phone: [phone] Fax: [phone]     Social Drivers of Health (SDOH) Social History: SDOH Screenings   Food Insecurity: No Food Insecurity (01/19/2024)   Received from [facility]  Housing: Low Risk  (02/19/2024)   Received from [facility]  Transportation Needs: No Transportation Needs (01/19/2024)   Received from [facility]  Utilities: Not At Risk (01/19/2024)   Received from [facility]  Depression [phone]): Low Risk  (12/13/2023)  Financial Resource Strain: Low Risk  (01/19/2024)   Received from [facility]  Physical Activity: Insufficiently Active (05/02/2023)   Received from [facility]  Social Connections: Moderately Isolated (05/02/2023)   Received from [facility]  Stress: Stress Concern Present (05/02/2023)   Received from [facility]  Tobacco Use: Low Risk  (05/26/2024)  Health Literacy: Inadequate Health Literacy (05/02/2023)   Received from [facility]   SDOH Interventions:     Readmission Risk Interventions     No data to display

## 2024-05-27 NOTE — Progress Notes (Signed)
 PHARMACY - PHYSICIAN COMMUNICATION CRITICAL VALUE ALERT - BLOOD CULTURE IDENTIFICATION (BCID)  Suzanne Dixon is an 73 y.o. female who presented to [facility] on 05/26/2024 with a chief complaint of generalized weakness.  Assessment:  1/4 bottles growing Ecoli (no resistance)  Name of physician (or Provider) Contacted: Dr. Marsa  Current antibiotics: ceftriaxone  1 IV every 24 hours  Changes to prescribed antibiotics recommended:  Recommendations accepted by provider Will increase ceftriaxone  to 2 g IV every 24 hours  Results for orders placed or performed during the hospital encounter of 01/12/23  Blood Culture ID Panel (Reflexed) (Collected: 01/12/2023  5:37 PM)  Result Value Ref Range   Enterococcus faecalis NOT DETECTED NOT DETECTED   Enterococcus Faecium NOT DETECTED NOT DETECTED   Listeria monocytogenes NOT DETECTED NOT DETECTED   Staphylococcus species NOT DETECTED NOT DETECTED   Staphylococcus aureus (BCID) NOT DETECTED NOT DETECTED   Staphylococcus epidermidis NOT DETECTED NOT DETECTED   Staphylococcus lugdunensis NOT DETECTED NOT DETECTED   Streptococcus species NOT DETECTED NOT DETECTED   Streptococcus agalactiae NOT DETECTED NOT DETECTED   Streptococcus pneumoniae NOT DETECTED NOT DETECTED   Streptococcus pyogenes NOT DETECTED NOT DETECTED   A.calcoaceticus-baumannii NOT DETECTED NOT DETECTED   Bacteroides fragilis NOT DETECTED NOT DETECTED   Enterobacterales DETECTED (A) NOT DETECTED   Enterobacter cloacae complex NOT DETECTED NOT DETECTED   Escherichia coli DETECTED (A) NOT DETECTED   Klebsiella aerogenes NOT DETECTED NOT DETECTED   Klebsiella oxytoca NOT DETECTED NOT DETECTED   Klebsiella pneumoniae NOT DETECTED NOT DETECTED   Proteus species NOT DETECTED NOT DETECTED   Salmonella species NOT DETECTED NOT DETECTED   Serratia marcescens NOT DETECTED NOT DETECTED   Haemophilus influenzae NOT DETECTED NOT DETECTED   Neisseria meningitidis NOT DETECTED NOT  DETECTED   Pseudomonas aeruginosa NOT DETECTED NOT DETECTED   Stenotrophomonas maltophilia NOT DETECTED NOT DETECTED   Candida albicans NOT DETECTED NOT DETECTED   Candida auris NOT DETECTED NOT DETECTED   Candida glabrata NOT DETECTED NOT DETECTED   Candida krusei NOT DETECTED NOT DETECTED   Candida parapsilosis NOT DETECTED NOT DETECTED   Candida tropicalis NOT DETECTED NOT DETECTED   Cryptococcus neoformans/gattii NOT DETECTED NOT DETECTED   CTX-M ESBL NOT DETECTED NOT DETECTED   Carbapenem resistance IMP NOT DETECTED NOT DETECTED   Carbapenem resistance KPC NOT DETECTED NOT DETECTED   Carbapenem resistance NDM NOT DETECTED NOT DETECTED   Carbapenem resist OXA 48 LIKE NOT DETECTED NOT DETECTED   Carbapenem resistance VIM NOT DETECTED NOT DETECTED   Thank you for involving pharmacy in this patient's care.   Damien Napoleon, PharmD Clinical Pharmacist 05/27/2024 4:30 PM

## 2024-05-27 NOTE — Assessment & Plan Note (Addendum)
-   Will continue antihypertensive therapy.

## 2024-05-27 NOTE — Assessment & Plan Note (Signed)
-   Will continue Synthroid . - Will check TSH.

## 2024-05-27 NOTE — Assessment & Plan Note (Signed)
Will continue allopurinol. 

## 2024-05-27 NOTE — Consult Note (Signed)
 PHARMACY - ANTICOAGULATION CONSULT NOTE  Pharmacy Consult for Warfarin Indication: atrial fibrillation  Allergies  Allergen Reactions   Advair Diskus [Fluticasone-Salmeterol] Other (See Comments)    Joint pain    Alendronate     Muscle pain   Singulair [Montelukast] Other (See Comments)    muscle pain   Sulfa Antibiotics Hives   Venlafaxine Other (See Comments)    altered mental status/tremors    Patient Measurements: Height: 5' 4 (162.6 cm) Weight: 112.7 kg (248 lb 8 oz) IBW/kg (Calculated) : 54.7 HEPARIN  DW (KG): 81.7  Vital Signs: Temp: 98.4 F (36.9 C) (09/08 0621) Temp Source: Oral (09/08 0311) BP: 109/72 (09/08 0600) Pulse Rate: 83 (09/08 0600)  Labs: Recent Labs    05/26/24 2134 05/26/24 2237 05/26/24 2355 05/27/24 0847  HGB 10.9*  --   --  10.0*  HCT 32.2*  --   --  29.7*  PLT 208  --   --  214  LABPROT  --  18.7*  --  20.4*  INR  --  1.5*  --  1.6*  CREATININE 1.02*  --   --  0.81  TROPONINIHS 115*  --  122*  --     Estimated Creatinine Clearance: 73.8 mL/min (by C-G formula based on SCr of 0.81 mg/dL).   Medical History: Past Medical History:  Diagnosis Date   A-fib (HCC)    Anemia    B12 deficiency    Breast cancer (HCC) 03/31/2013   Right - chemo- mastectomy   Breast cancer (HCC) 1991   Rt.- radiation   Complication of anesthesia 01/2009   Recent years with general anesthesia had itching following surgery   DDD (degenerative disc disease), cervical    Depression    Diverticulitis    Dyspnea    Edema of both legs    GERD (gastroesophageal reflux disease)    Gout    H/O mastectomy 2014   per patient   H/O: cesarean section 1987   per patient report   H/O: hysterectomy 1996   per patient   Hydradenitis    Hypertension    Hypothyroidism    Neuropathy    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HCC)    Parkinson's disease (HCC)    Personal history of chemotherapy    Personal history of radiation therapy    Ruptured cervical disc 2002    per patient    Sleep apnea    cpap machine   Super obese    Tachycardia     Medications:  Warfarin 6 mg Mon/Wed/Fri and 3 mg EOD  Assessment: 73 yo female presenting to ED with complaint of generalized weakness and dysuria.  PMH includes Parkinson's, Afib, HTN, GERD, Gout, and lymphedema.  Pharmacy consulted to manage warfarin dosing for Afib.    Goal of Therapy:  INR 2-3 Monitor platelets by anticoagulation protocol: Yes    Date INR Warfarin Dose  9/7 1.5 --  9/8 1.6 7.5 mg     Plan:  --will give 7.5 mg warfarin po x 1 --INR daily to guide therapy --CBC at least once weekly per [facility]G guidelines  Adriana JONETTA Bolster, PharmD, BCPS 05/27/2024,10:01 AM

## 2024-05-27 NOTE — Assessment & Plan Note (Signed)
-   Will continue PPI therapy.

## 2024-05-27 NOTE — ED Provider Notes (Signed)
-----------------------------------------   1:17 AM on 05/27/2024 ----------------------------------------- Patient care assumed from Dr. Viviann.  Patient's workup has resulted showing a urinalysis showing rare bacteria with some white and red cells and no squamous cells.  Patient has already been on antibiotics for a couple days at home this could still be due to the patient's urinary tract infection.  Urine culture has been sent and the patient has received IV Rocephin  in the emergency department.  Patient's troponin is elevated however she has had elevated troponins previously as well.  Patient has increased lower extremity edema despite taking Lasix  at home.  Will start the patient on IV Lasix .  She has significant 3+ pitting edema in the lower extremities.  No chest pain.  We will hold off on heparin  for now.  Will admit to the hospitalist for ongoing workup and treatment with continued diuresis while awaiting culture results.   Dorothyann Drivers, MD 05/27/24 [phone]

## 2024-05-27 NOTE — Assessment & Plan Note (Addendum)
-   This likely secondary to acute on chronic diastolic CHF as well as persistent acute lower UTI. - The patient will be admitted to the cardiac telemetry observation bed. - Will continue diuresis with IV Lasix . - Will continue antibiotic therapy with IV Rocephin . - Most recent 2D echo revealed an EF of 50 to 55% with grade 1 diastolic function, revealed mitral valve regurgitation and moderate tricuspid valve regurgitation. - PT consult will be obtained.

## 2024-05-27 NOTE — Progress Notes (Signed)
  Brief Progress Note (See full H&P from earlier today)   Subjective: Pt reports feeling some better today compared to ED arrival. Denies CP/SOB, denies fever/chills    Objective:  Relevant new results:  Sodium, renal fxm, WBC improving. Net IO Since Admission: -1,460 mL [05/27/24 1544]  Physical Exam:  BP 121/77   Pulse 96   Temp 98.1 F (36.7 C) (Oral)   Resp (!) 22   Ht 5' 4 (1.626 m)   Wt 112.7 kg   SpO2 97%   BMI 42.65 kg/m  Constitutional:  General Appearance: alert, well-developed, well-nourished, NAD Respiratory: fair respiratory effort Rales at lung bases  Cardiovascular: S1/S2 normal, no murmur/rub/gallop auscultated (+)2 lower extremity edema Gastrointestinal: Nontender Musculoskeletal:  No clubbing/cyanosis of digits Neurological: No cranial nerve deficit on limited exam Psychiatric: Normal judgment/insight Normal mood and affect   Assessment/Plan changes or updates compared to H&P: Potassium replacement Continuing tx as per HPI otherwise

## 2024-05-27 NOTE — H&P (Signed)
 [place]   PATIENT NAME: Suzanne Dixon    MR#:  [id number]  DATE OF BIRTH:  [birth year]  DATE OF ADMISSION:  05/26/2024  PRIMARY CARE PHYSICIAN: [name], MD   Patient is coming from: Home  REQUESTING/REFERRING PHYSICIAN: Dorothyann Drivers, MD  CHIEF COMPLAINT:   Chief Complaint  Patient presents with   Weakness    HISTORY OF PRESENT ILLNESS:  Suzanne Dixon is a 73 y.o. African-American female with medical history significant for paroxysmal atrial fibrillation, vitamin B12 deficiency, depression, GERD, gout, hypertension, hypothyroidism, and OSA, who presented to the emergency room with acute onset of generalized weakness with loss of appetite and increased lymphedema.  She has been recently treated for UTI with p.o. Keflex .  She continues to have dysuria, urinary frequency and urgency.  No fever or chills.  No nausea or vomiting or abdominal pain.  No chest pain or palpitations.  No cough or wheezing or dyspnea.  No bleeding diathesis.  ED Course: Upon presentation to the ER, BP was 126/63 with heart rate of 107 respiratory rate of 31, temperature 98 and pulse currently 99% on room air.  Labs revealed mild hyponatremia 131 hypokalemia 97 with calcium  of 7.5 albumin 2.7 and AST of 63 with total protein of 6.4 and total bili of 1.3.  BNP was 289.5 and high-sensitivity troponin I was 115 and later 122.  Lactic acid was 1.6 and CBC showed leukocytosis 22.5 with neutrophilia and anemia.  INR is 1.5 and PT 18.7.  Respiratory panel came back negative.  UA came back with 6-10 WBCs with 21-50 RBCs and rare bacteria with small leukocytes.  Blood and urine cultures were sent. EKG as reviewed by me : EKG showed sinus tachycardia with a rate of 105 with probable left ventricular hypertrophy. Imaging: Portable chest x-ray showed no acute cardiopulmonary disease.  The patient was given 2 g of IV Rocephin  and 60 mg of IV Lasix  and prior to that 1 L bolus of IV normal saline.  She will  be admitted to a cardiac telemetry observation bed for further evaluation and management. PAST MEDICAL HISTORY:   Past Medical History:  Diagnosis Date   A-fib (HCC)    Anemia    B12 deficiency    Breast cancer (HCC) 03/31/2013   Right - chemo- mastectomy   Breast cancer (HCC) 1991   Rt.- radiation   Complication of anesthesia 01/2009   Recent years with general anesthesia had itching following surgery   DDD (degenerative disc disease), cervical    Depression    Diverticulitis    Dyspnea    Edema of both legs    GERD (gastroesophageal reflux disease)    Gout    H/O mastectomy 2014   per patient   H/O: cesarean section 1987   per patient report   H/O: hysterectomy 1996   per patient   Hydradenitis    Hypertension    Hypothyroidism    Neuropathy    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HCC)    Parkinson's disease (HCC)    Personal history of chemotherapy    Personal history of radiation therapy    Ruptured cervical disc 2002   per patient    Sleep apnea    cpap machine   Super obese    Tachycardia     PAST SURGICAL HISTORY:   Past Surgical History:  Procedure Laterality Date   ABDOMINAL HYSTERECTOMY     APPLICATION OF INTRAOPERATIVE CT SCAN N/A 07/07/2023  Procedure: APPLICATION OF INTRAOPERATIVE CT SCAN;  Surgeon: [name], MD;  Location: [facility] ORS;  Service: Neurosurgery;  Laterality: N/A;   BACK SURGERY  2002   plate and 2 screws in neck   BREAST BIOPSY Right 1991,2014   Positive   BREAST CYST ASPIRATION Left    BREAST SURGERY Right 2014   mastectomy   CATARACT EXTRACTION W/PHACO Left 05/17/2019   Procedure: CATARACT EXTRACTION PHACO AND INTRAOCULAR LENS PLACEMENT (IOC);  Surgeon: Myrna Adine Anes, MD;  Location: [facility];  Service: Ophthalmology;  Laterality: Left;  US  00:28 CDE 2.15 FLUID PACK LOT # [id number]   CATARACT EXTRACTION W/PHACO Right 06/14/2019   Procedure: CATARACT EXTRACTION PHACO AND INTRAOCULAR LENS PLACEMENT (IOC) RIGHT;  Surgeon: Myrna Adine Anes, MD;  Location: [facility];  Service: Ophthalmology;  Laterality: Right;  US  00:35.0 CDE 2.37 Fluid Pack Lot #[id number]   CESAREAN SECTION     CHOLECYSTECTOMY  04/09/2012   COLONOSCOPY WITH PROPOFOL  N/A 06/22/2015   Procedure: COLONOSCOPY WITH PROPOFOL ;  Surgeon: Lamar ONEIDA Holmes, MD;  Location: [facility] ENDOSCOPY;  Service: Endoscopy;  Laterality: N/A;   COLONOSCOPY WITH PROPOFOL  N/A 05/25/2021   Procedure: COLONOSCOPY WITH PROPOFOL ;  Surgeon: Maryruth Ole ONEIDA, MD;  Location: [facility] ENDOSCOPY;  Service: Endoscopy;  Laterality: N/A;   ESOPHAGOGASTRODUODENOSCOPY     ESOPHAGOGASTRODUODENOSCOPY N/A 05/25/2021   Procedure: ESOPHAGOGASTRODUODENOSCOPY (EGD);  Surgeon: Maryruth Ole ONEIDA, MD;  Location: [facility] ENDOSCOPY;  Service: Endoscopy;  Laterality: N/A;   EYE SURGERY     LAPAROSCOPIC GASTRIC BYPASS     MASTECTOMY Right    MASTECTOMY MODIFIED RADICAL     mastectomy partial      lumpectomy   NEUROPLASTY / TRANSPOSITION ULNAR NERVE AT ELBOW     OOPHORECTOMY     PORT-A-CATH REMOVAL Left 05/13/2015   Procedure: REMOVAL PORT-A-CATH LEFT CHEST ;  Surgeon: Charlie FORBES Fell, MD;  Location: [facility];  Service: General;  Laterality: Left;   REDUCTION MAMMAPLASTY Left    REVERSE SHOULDER ARTHROPLASTY Right 12/03/2020   Procedure: REVERSE SHOULDER ARTHROPLASTY;  Surgeon: Edie Norleen PARAS, MD;  Location: [facility] ORS;  Service: Orthopedics;  Laterality: Right;   ROUX-EN-Y GASTRIC BYPASS  01/26/2009   SHOULDER ARTHROSCOPY WITH OPEN ROTATOR CUFF REPAIR Right 01/03/2017   Procedure: SHOULDER ARTHROSCOPY WITH OPEN ROTATOR CUFF REPAIR;  Surgeon: Norleen PARAS Edie, MD;  Location: [facility];  Service: Orthopedics;  Laterality: Right;   SHOULDER ARTHROSCOPY WITH SUBACROMIAL DECOMPRESSION, ROTATOR CUFF REPAIR AND BICEP TENDON REPAIR Right 01/03/2017   Procedure: SHOULDER ARTHROSCOPY WITH SUBACROMIAL DECOMPRESSION, ROTATOR CUFF REPAIR AND BICEP TENDON REPAIR;  Surgeon: Norleen PARAS Edie, MD;  Location: [facility] ORS;  Service:  Orthopedics;  Laterality: Right;  Limited debridement   SPINE SURGERY      SOCIAL HISTORY:   Social History   Tobacco Use   Smoking status: Never   Smokeless tobacco: Never  Substance Use Topics   Alcohol use: No    Alcohol/week: 0.0 standard drinks of alcohol    FAMILY HISTORY:   Family History  Problem Relation Age of Onset   Hypertension Mother    Lymphoma Mother    Cancer Sister        Breast   Breast cancer Sister 81    DRUG ALLERGIES:   Allergies  Allergen Reactions   Advair Diskus [Fluticasone-Salmeterol] Other (See Comments)    Joint pain    Alendronate     Muscle pain   Singulair [Montelukast] Other (See Comments)    muscle pain   Sulfa  Antibiotics Hives   Venlafaxine Other (See Comments)    altered mental status/tremors    REVIEW OF SYSTEMS:   ROS As per history of present illness. All pertinent systems were reviewed above. Constitutional, HEENT, cardiovascular, respiratory, GI, GU, musculoskeletal, neuro, psychiatric, endocrine, integumentary and hematologic systems were reviewed and are otherwise negative/unremarkable except for positive findings mentioned above in the HPI.   MEDICATIONS AT HOME:   Prior to Admission medications   Medication Sig Start Date End Date Taking? Authorizing Provider  allopurinol  (ZYLOPRIM ) 300 MG tablet Take 300 mg by mouth daily.    Yes [provider]  baclofen  (LIORESAL ) 10 MG tablet Take 0.5-1 tablets (5-10 mg total) by mouth 2 (two) times daily. 12/13/23  Yes Lovorn, Megan, MD  Calcium  Carb-Cholecalciferol  600-400 MG-UNIT TABS Take 1 tablet by mouth 2 (two) times daily with a meal.   Yes [provider]  cephALEXin  (KEFLEX ) 500 MG capsule Take 500 mg by mouth 3 (three) times daily. 05/26/24 05/31/24 Yes [provider]  diclofenac Sodium (VOLTAREN) 1 % GEL Apply 2 g topically as needed.   Yes [provider]  DULoxetine  (CYMBALTA ) 60 MG capsule Take 60 mg by mouth 2 (two) times  daily. 02/23/21  Yes [provider]  furosemide  (LASIX ) 40 MG tablet Take 40 mg by mouth daily as needed for edema. 05/23/24 05/23/25 Yes [provider]  gabapentin  (NEURONTIN ) 300 MG capsule Take 1 capsule (300 mg total) by mouth 4 (four) times daily. 07/13/23  Yes Wieting, Richard, MD  ipratropium (ATROVENT) 0.06 % nasal spray Place 2 sprays into both nostrils 2 (two) times daily. For 7 days 04/26/24  Yes [provider]  levothyroxine  (SYNTHROID , LEVOTHROID) 50 MCG tablet Take 50 mcg by mouth daily before breakfast.   Yes [provider]  magnesium  oxide (MAG-OX) 400 MG tablet Take 1 tablet by mouth daily. 03/05/24  Yes [provider]  meloxicam  (MOBIC ) 15 MG tablet Take 1 tablet by mouth daily as needed. 01/08/24  Yes [provider]  methocarbamol  (ROBAXIN ) 500 MG tablet Take 1 tablet (500 mg total) by mouth every 6 (six) hours as needed for muscle spasms (Back pain). 07/13/23  Yes Wieting, Richard, MD  metoprolol  tartrate (LOPRESSOR ) 100 MG tablet Take 100 mg by mouth 2 (two) times daily.   Yes [provider]  omeprazole (PRILOSEC) 20 MG capsule Take 20 mg by mouth daily.    Yes [provider]  potassium chloride  (KLOR-CON ) 10 MEQ tablet Take 10 mEq by mouth daily as needed ((furosemide )). 05/23/24 05/23/25 Yes [provider]  Vibegron 75 MG TABS Take 75 mg by mouth daily. 03/29/24  Yes [provider]  vitamin B-12 (CYANOCOBALAMIN ) 1000 MCG tablet Take 1,000 mcg by mouth daily.   Yes [provider]  warfarin (COUMADIN ) 1 MG tablet Take 3 tablets (3 mg total) by mouth daily at 4 PM. Monday and Friday Patient taking differently: Take 3 mg by mouth daily at 4 PM. Sunday, Tuesday, Thursday, Saturday 07/21/23  Yes Wieting, Richard, MD  warfarin (COUMADIN ) 6 MG tablet Take 1 tablet (6 mg total) by mouth daily at 4 PM. Tuesday, Wednesday, Thursday, Saturday and Sunday Patient taking differently: Take 6 mg by  mouth daily at 4 PM. Monday, Wednesday, Friday. 07/22/23  Yes Wieting, Richard, MD      VITAL SIGNS:  Blood pressure 114/80, pulse 90, temperature 98.8 F (37.1 C), temperature source Oral, resp. rate (!) 23, height 5' 4 (1.626 m), weight 112.7 kg, SpO2  98%.  PHYSICAL EXAMINATION:  Physical Exam  GENERAL:  73 y.o.-year-old female patient lying in the bed with no acute distress.  EYES: Pupils equal, round, reactive to light and accommodation. No scleral icterus. Extraocular muscles intact.  HEENT: Head atraumatic, normocephalic. Oropharynx and nasopharynx clear.  NECK:  Supple, no jugular venous distention. No thyroid enlargement, no tenderness.  LUNGS: Mildly diminished bibasilar breath sounds.. No use of accessory muscles of respiration.  CARDIOVASCULAR: Regular rate and rhythm, S1, S2 normal. No murmurs, rubs, or gallops.  ABDOMEN: Soft, nondistended, nontender. Bowel sounds present. No organomegaly or mass.  EXTREMITIES: Bilateral lower extremity 2+ edema with no cyanosis, or clubbing.  NEUROLOGIC: Cranial nerves II through XII are intact. Muscle strength 5/5 in all extremities. Sensation intact. Gait not checked.  PSYCHIATRIC: The patient is alert and oriented x 3.  Normal affect and good eye contact. SKIN: No obvious rash, lesion, or ulcer.   LABORATORY PANEL:   CBC Recent Labs  Lab 05/26/24 2134  WBC 22.5*  HGB 10.9*  HCT 32.2*  PLT 208   ------------------------------------------------------------------------------------------------------------------  Chemistries  Recent Labs  Lab 05/26/24 2134  NA 131*  K 4.0  CL 97*  CO2 23  GLUCOSE 119*  BUN 21  CREATININE 1.02*  CALCIUM  7.5*  AST 63*  ALT 23  ALKPHOS 86  BILITOT 1.3*   ------------------------------------------------------------------------------------------------------------------  Cardiac Enzymes No results for input(s): TROPONINI in the last 168  hours. ------------------------------------------------------------------------------------------------------------------  RADIOLOGY:  DG Chest Portable 1 View Result Date: 05/26/2024 EXAM: 1 VIEW XRAY OF THE CHEST 05/26/2024 10:45:58 PM COMPARISON: 01/12/23. CLINICAL HISTORY: Suspected sepsis, gen weakness. Per er note; Pt arrived by EMS with complaints of possible UTI. Per Aide at home, PCP put Pt on Keflex  and she took her first round of 3 doses today. Pt has Parkinsons and Lymphedema Right leg worse than Left. FINDINGS: LUNGS AND PLEURA: Low lung volumes. No focal consolidation, pleural effusion, or pneumothorax is identified. HEART AND MEDIASTINUM: The cardiomediastinal silhouette stable. Pulmonary vascularity is within normal limits. BONES AND SOFT TISSUES: No acute osseous abnormality. IMPRESSION: 1. No acute process. Electronically signed by: Norman Gatlin MD 05/26/2024 10:49 PM EDT RP Workstation: [id number]   US  Venous Img Lower Bilateral Result Date: 05/26/2024 CLINICAL DATA:  Bilateral lower extremity edema EXAM: BILATERAL LOWER EXTREMITY VENOUS DOPPLER ULTRASOUND TECHNIQUE: Gray-scale sonography with compression, as well as color and duplex ultrasound, were performed to evaluate the deep venous system(s) from the level of the common femoral vein through the popliteal and proximal calf veins. COMPARISON:  01/19/2021 FINDINGS: VENOUS Normal compressibility of the common femoral, superficial femoral, and popliteal veins, as well as the visualized calf veins. Visualized portions of profunda femoral vein and great saphenous vein unremarkable. No filling defects to suggest DVT on grayscale or color Doppler imaging. Doppler waveforms show normal direction of venous flow, normal respiratory plasticity and response to augmentation. OTHER None. Limitations: Suboptimal visualization of the calf veins due to subcutaneous edema and patient body habitus. IMPRESSION: 1. No evidence of deep venous thrombosis  within either lower extremity. Electronically Signed   By: Ozell Daring M.D.   On: 05/26/2024 22:32      IMPRESSION AND PLAN:  Assessment and Plan: * Generalized weakness - This likely secondary to acute on chronic diastolic CHF as well as persistent acute lower UTI. - The patient will be admitted to the cardiac telemetry observation bed. - Will continue diuresis with IV Lasix . - Will continue antibiotic therapy with IV Rocephin . - Most recent 2D echo  revealed an EF of 50 to 55% with grade 1 diastolic function, revealed mitral valve regurgitation and moderate tricuspid valve regurgitation. - PT consult will be obtained.  GERD without esophagitis - Will continue PPI therapy.  Paroxysmal atrial fibrillation (HCC) - Will continue Coumadin .  Essential hypertension - Will continue anti-hypertensive therapy.  Hypothyroidism - Will continue Synthroid . - Will check TSH.  Gout - Will continue allopurinol .   DVT prophylaxis: Coumadin  Advanced Care Planning:  Code Status: full code.  Family Communication:  The plan of care was discussed in details with the patient (and family). I answered all questions. The patient agreed to proceed with the above mentioned plan. Further management will depend upon hospital course. Disposition Plan: Back to previous home environment Consults called: none.  All the records are reviewed and case discussed with ED provider.  Status is: Observation  I certify that at the time of admission, it is my clinical judgment that the patient will require inpatient hospital care extending more than 2 midnights.                            Dispo: The patient is from: Home              Anticipated d/c is to: Home              Patient currently is not medically stable to d/c.              Difficult to place patient: No  Madison DELENA Peaches M.D on 05/27/2024 at 5:02 AM  [facility]   From 7 PM-7 AM, contact night-coverage [email or web address]  CC: Primary care  physician; [name], MD

## 2024-05-28 DIAGNOSIS — R531 Weakness: Secondary | ICD-10-CM | POA: Diagnosis not present

## 2024-05-28 LAB — BASIC METABOLIC PANEL WITH GFR
Anion gap: 8 (ref 5–15)
BUN: 19 mg/dL (ref 8–23)
CO2: 29 mmol/L (ref 22–32)
Calcium: 7.2 mg/dL — ABNORMAL LOW (ref 8.9–10.3)
Chloride: 98 mmol/L (ref 98–111)
Creatinine, Ser: 0.8 mg/dL (ref 0.44–1.00)
GFR, Estimated: >60 mL/min (ref >60)
Glucose, Bld: 87 mg/dL (ref 70–99)
Potassium: 3.1 mmol/L — ABNORMAL LOW (ref 3.5–5.1)
Sodium: 135 mmol/L (ref 135–145)

## 2024-05-28 LAB — CBC
HCT: 30.1 % — ABNORMAL LOW (ref 36.0–46.0)
Hemoglobin: 10 g/dL — ABNORMAL LOW (ref 12.0–15.0)
MCH: 28.9 pg (ref 26.0–34.0)
MCHC: 33.2 g/dL (ref 30.0–36.0)
MCV: 87 fL (ref 80.0–100.0)
Platelets: 223 K/uL (ref 150–400)
RBC: 3.46 MIL/uL — ABNORMAL LOW (ref 3.87–5.11)
RDW: 16.4 % — ABNORMAL HIGH (ref 11.5–15.5)
WBC: 11.4 K/uL — ABNORMAL HIGH (ref 4.0–10.5)
nRBC: 0 % (ref 0.0–0.2)

## 2024-05-28 LAB — LACTIC ACID, PLASMA: Lactic Acid, Venous: 1 mmol/L (ref 0.5–1.9)

## 2024-05-28 LAB — URINE CULTURE: Culture: NO GROWTH

## 2024-05-28 LAB — PROTIME-INR
INR: 1.4 — ABNORMAL HIGH (ref 0.8–1.2)
Prothrombin Time: 17.5 s — ABNORMAL HIGH (ref 11.4–15.2)

## 2024-05-28 MED ORDER — POTASSIUM CHLORIDE CRYS ER 20 MEQ PO TBCR
40.0000 meq | EXTENDED_RELEASE_TABLET | Freq: Once | ORAL | Status: AC
  Administered 2024-05-28: 40 meq via ORAL
  Filled 2024-05-28: qty 2

## 2024-05-28 MED ORDER — WARFARIN SODIUM 7.5 MG PO TABS
7.5000 mg | ORAL_TABLET | Freq: Once | ORAL | Status: AC
  Administered 2024-05-28: 7.5 mg via ORAL
  Filled 2024-05-28: qty 1

## 2024-05-28 MED ORDER — POTASSIUM CHLORIDE CRYS ER 20 MEQ PO TBCR
40.0000 meq | EXTENDED_RELEASE_TABLET | Freq: Once | ORAL | Status: AC
Start: 2024-05-28 — End: 2024-05-28
  Administered 2024-05-28: 40 meq via ORAL
  Filled 2024-05-28: qty 2

## 2024-05-28 NOTE — Consult Note (Signed)
 PHARMACY - ANTICOAGULATION CONSULT NOTE  Pharmacy Consult for Warfarin Indication: atrial fibrillation  Allergies  Allergen Reactions   Advair Diskus [Fluticasone-Salmeterol] Other (See Comments)    Joint pain    Alendronate     Muscle pain   Singulair [Montelukast] Other (See Comments)    muscle pain   Sulfa Antibiotics Hives   Venlafaxine Other (See Comments)    altered mental status/tremors    Patient Measurements: Height: 5' 4 (162.6 cm) Weight: 102.8 kg (226 lb 10.1 oz) IBW/kg (Calculated) : 54.7 HEPARIN  DW (KG): 81.7  Vital Signs: Temp: 97.9 F (36.6 C) (09/09 0448) Temp Source: Oral (09/09 0448) BP: 124/80 (09/09 0448) Pulse Rate: 78 (09/09 0448)  Labs: Recent Labs    05/26/24 2134 05/26/24 2237 05/26/24 2355 05/27/24 0847 05/28/24 0506  HGB 10.9*  --   --  10.0* 10.0*  HCT 32.2*  --   --  29.7* 30.1*  PLT 208  --   --  214 223  LABPROT  --  18.7*  --  20.4* 17.5*  INR  --  1.5*  --  1.6* 1.4*  CREATININE 1.02*  --   --  0.81 0.80  TROPONINIHS 115*  --  122*  --   --     Estimated Creatinine Clearance: 70.9 mL/min (by C-G formula based on SCr of 0.8 mg/dL).   Medical History: Past Medical History:  Diagnosis Date   A-fib (HCC)    Anemia    B12 deficiency    Breast cancer (HCC) 03/31/2013   Right - chemo- mastectomy   Breast cancer (HCC) 1991   Rt.- radiation   Complication of anesthesia 01/2009   Recent years with general anesthesia had itching following surgery   DDD (degenerative disc disease), cervical    Depression    Diverticulitis    Dyspnea    Edema of both legs    GERD (gastroesophageal reflux disease)    Gout    H/O mastectomy 2014   per patient   H/O: cesarean section 1987   per patient report   H/O: hysterectomy 1996   per patient   Hydradenitis    Hypertension    Hypothyroidism    Neuropathy    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HCC)    Parkinson's disease (HCC)    Personal history of chemotherapy    Personal history of  radiation therapy    Ruptured cervical disc 2002   per patient    Sleep apnea    cpap machine   Super obese    Tachycardia     Medications:  Warfarin 6 mg Mon/Wed/Fri and 3 mg EOD  Assessment: 73 yo female presenting to ED with complaint of generalized weakness and dysuria.  PMH includes Parkinson's, Afib, HTN, GERD, Gout, and lymphedema.  Pharmacy consulted to manage warfarin dosing for Afib. CHADSVASc 6 (age, sex, CVA, HTN).     Goal of Therapy:  INR 2-3 Monitor platelets by anticoagulation protocol: Yes    Date INR Warfarin Dose  9/7 1.5 --  9/8 1.6 7.5 mg  9/9 1.4 7.5 mg     Plan:  INR is subtherapeutic. Will give warfarin 7.5 mg x 1 (~25%  increase from home dose) to help get the INR back to goal. Daily INR. CBC at least every 3 days. Pt may need bridge until INR becomes therapeutic, If INR does not trend up tomorrow will ask for a enoxaparin  bridge.   Cathaleen GORMAN Blanch, PharmD, BCPS 05/28/2024,7:47 AM

## 2024-05-28 NOTE — Evaluation (Signed)
 Occupational Therapy Evaluation Patient Details Name: Suzanne Dixon MRN: [id number] DOB: [birth year] Today's Date: 05/28/2024   History of Present Illness   Suzanne Dixon is a 73 y.o. African-American female with medical history significant for paroxysmal atrial fibrillation, vitamin B12 deficiency, depression, GERD, gout, hypertension, hypothyroidism, and OSA, who presented to the emergency room with acute onset of generalized weakness with loss of appetite and increased lymphedema. Pt with Recetn Hx of Spine surgery in October 2024. Pt was receiving Home PT/OT until this ED visit.     Clinical Impressions Patient presenting with decreased Ind in self care,balance, functional mobility/transfers, endurance, and safety awareness. Patient reports living at home alone. Pt endorses use of RW to transfer into w/c or power chair for mobility at home. Pt has PCA that assists with home management tasks and ADLs several hours each day. She is able to transfer herself in/out of wheelchair and on/off of commode for toileting needs at baseline. Pt found in bed this session with bed linens soiled in urine. Pt needing assistance for bed mobility. Pt stands with min A and to transfers to recliner chair with use of RW. Pt does fatigue quickly during this session. Nurse tech notified that purewick has been removed. Call bell and all needed items within reach. Pt needing assistance for transfers at this time and not at baseline. Patient will benefit from acute OT to increase overall independence in the areas of ADLs, functional mobility, and safety awareness in order to safely discharge.     If plan is discharge home, recommend the following:   A little help with walking and/or transfers;A lot of help with bathing/dressing/bathroom;Assistance with cooking/housework;Assist for transportation     Functional Status Assessment   Patient has had a recent decline in their functional status and demonstrates the ability  to make significant improvements in function in a reasonable and predictable amount of time.     Equipment Recommendations   None recommended by OT      Precautions/Restrictions   Precautions Precautions: Fall Recall of Precautions/Restrictions: Intact Restrictions Weight Bearing Restrictions Per Provider Order: No     Mobility Bed Mobility Overal bed mobility: Needs Assistance Bed Mobility: Supine to Sit     Supine to sit: Min assist          Transfers Overall transfer level: Needs assistance Equipment used: Rolling walker (2 wheels) Transfers: Sit to/from Stand Sit to Stand: Min assist                  Balance Overall balance assessment: Needs assistance Sitting-balance support: Bilateral upper extremity supported Sitting balance-Leahy Scale: Good     Standing balance support: Bilateral upper extremity supported, Reliant on assistive device for balance, During functional activity Standing balance-Leahy Scale: Fair                             ADL either performed or assessed with clinical judgement   ADL Overall ADL's : Needs assistance/impaired                         Toilet Transfer: Minimal assistance;Rolling walker (2 wheels);Ambulation Toilet Transfer Details (indicate cue type and reason): simulated transfer                 Vision Patient Visual Report: No change from baseline              Pertinent Vitals/Pain Pain Assessment Pain  Assessment: Faces Faces Pain Scale: Hurts little more Pain Location: R LE Pain Descriptors / Indicators: Discomfort Pain Intervention(s): Monitored during session, Repositioned     Extremity/Trunk Assessment Upper Extremity Assessment Upper Extremity Assessment: Generalized weakness   Lower Extremity Assessment Lower Extremity Assessment: Generalized weakness       Communication Communication Communication: No apparent difficulties   Cognition Arousal:  Alert Behavior During Therapy: WFL for tasks assessed/performed Cognition: No apparent impairments                               Following commands: Intact       Cueing  General Comments   Cueing Techniques: Verbal cues              Home Living Family/patient expects to be discharged to:: Private residence Living Arrangements: Alone Available Help at Discharge: Family;Available PRN/intermittently;Personal care attendant Type of Home: House Home Access: Ramped entrance     Home Layout: One level     Bathroom Shower/Tub: Producer, television/film/video: Handicapped height     Home Equipment: Agricultural consultant (2 wheels);Cane - single point;Wheelchair - manual;Shower seat;Hospital bed;Electric scooter          Prior Functioning/Environment Prior Level of Function : Needs assist             Mobility Comments: Pt Mod I with transfers to W/C and [employer]. HHaide x 4 hours for, cleaning, bathing, minimal cooking and house chores. ADLs Comments: Pt has HH aide that assists her with ADLs. She endorses getting wheelchair in bathroom and able to transfer herself with grab bars onto toilet for toileting needs.    OT Problem List: Decreased strength;Decreased range of motion;Decreased safety awareness;Decreased activity tolerance;Decreased knowledge of use of DME or AE;Impaired balance (sitting and/or standing)   OT Treatment/Interventions: Self-care/ADL training;Therapeutic activities;Therapeutic exercise;Patient/family education;DME and/or AE instruction      OT Goals(Current goals can be found in the care plan section)   Acute Rehab OT Goals Patient Stated Goal: to go home OT Goal Formulation: With patient Time For Goal Achievement: 06/11/24 Potential to Achieve Goals: Fair ADL Goals Pt Will Perform Grooming: with modified independence;standing Pt Will Transfer to Toilet: with supervision;ambulating Pt Will Perform Toileting - Clothing  Manipulation and hygiene: with supervision;sit to/from stand Pt/caregiver will Perform Home Exercise Program: Increased strength;Both right and left upper extremity;With theraband;With written HEP provided   OT Frequency:  Min 2X/week       AM-PAC OT 6 Clicks Daily Activity     Outcome Measure Help from another person eating meals?: None Help from another person taking care of personal grooming?: None Help from another person toileting, which includes using toliet, bedpan, or urinal?: A Little Help from another person bathing (including washing, rinsing, drying)?: A Little Help from another person to put on and taking off regular upper body clothing?: A Little Help from another person to put on and taking off regular lower body clothing?: A Lot 6 Click Score: 19   End of Session Equipment Utilized During Treatment: Rolling walker (2 wheels) Nurse Communication: Mobility status;Other (comment) (Pt request to shower)  Activity Tolerance: Patient tolerated treatment well Patient left: in chair;with call bell/phone within reach;with chair alarm set  OT Visit Diagnosis: Unsteadiness on feet (R26.81);Repeated falls (R29.6);Muscle weakness (generalized) (M62.81)                Time: 9059-8991 OT Time Calculation (min): 28 min Charges:  OT General Charges $OT Visit: 1 Visit OT Evaluation $OT Eval Moderate Complexity: 1 Mod OT Treatments $Self Care/Home Management : 8-22 mins Izetta Claude, MS, OTR/L , CBIS ascom [phone]  05/28/24, 2:48 PM

## 2024-05-28 NOTE — Progress Notes (Signed)
 PROGRESS NOTE    [name]   FMW:[id number] DOB: 16-Mar-1951  DOA: 05/26/2024 Date of Service: 05/28/24 which is hospital day 1  PCP: [name], MD    Hospital course / significant events:   HPI: Suzanne Dixon is a 73 y.o. African-American female with medical history significant for paroxysmal atrial fibrillation, vitamin B12 deficiency, depression, GERD, gout, hypertension, hypothyroidism, and OSA, who presented to the emergency room with acute onset of generalized weakness with loss of appetite and increased lymphedema.  She has been recently treated for UTI with p.o. Keflex .  She continues to have dysuria, urinary frequency and urgency.   09/08: admitted to hospitalist for UTI and concern for CHF> BCx (+)Ecoli.  09/09: continuing abx pending susceptibilities     Consultants:  None  Procedures/Surgeries: none      ASSESSMENT & PLAN:   BCx x1 (+)EColi  Pending susceptibilities  UCx no growth but she was on abx prior to collection of specimen   Hypokalemia Replace as needed Monitor BMP  Lymphedema Acute on chronic HFpEF  Essential HTN  Most recent 2D echo revealed an EF of 50 to 55% with grade 1 diastolic function, revealed mitral valve regurgitation and moderate tricuspid valve regurgitation.  Diuresing  I&O Other rx: metoprolol . On coumadin  so no ASA>   Paroxysmal Afib Coumadin   Metoprolol    GERD PPI  Hypothyroid Synthroid   Gout Allopurinol   Anx/Dep Home Cymbalta  trazodone  prn  Neuropathy, pain Gabapentin , robaxin       Class 3 morbid obesity based on BMI: Body mass index is 42.65 kg/m.SABRA Significantly low or high BMI is associated with higher medical risk.  Underweight - under 18  overweight - 25 to 29 obese - 30 or more Class 1 obesity: BMI of 30.0 to 34 Class 2 obesity: BMI of 35.0 to 39 Class 3 obesity: BMI of 40.0 to 49 Super Morbid Obesity: BMI 50-59 Super-super Morbid Obesity: BMI 60+ Healthy nutrition and physical  activity advised as adjunct to other disease management and risk reduction treatments    DVT prophylaxis: coumadin  IV fluids: no continuous IV fluids  Nutrition: regular Central lines / other devices: none  Code Status: FULL CODE ACP documentation reviewed:  none on file in [facility]  TOC needs: pt declines SNF will need at least [facility]  Medical barriers to dispo: culture results. Expected medical readiness for discharge pending cultures / abx .              Subjective / Brief ROS:  Patient reports fatigued but otherwise ok / bit better from yesterday  Denies CP/SOB.  Pain controlled.  Denies new weakness.  Tolerating diet.  Reports no concerns w/ urination/defecation.   Family Communication: none at this time     Objective Findings:  Vitals:   05/28/24 0452 05/28/24 0807 05/28/24 1150 05/28/24 1524  BP:  123/74 (!) 140/88 116/76  Pulse:  77 87 85  Resp:  15 15   Temp:  98.1 F (36.7 C) 97.9 F (36.6 C) 97.7 F (36.5 C)  TempSrc:      SpO2:  97% 100% 97%  Weight: 102.8 kg     Height:        Intake/Output Summary (Last 24 hours) at 05/28/2024 1652 Last data filed at 05/28/2024 1526 Gross per 24 hour  Intake 720 ml  Output 3050 ml  Net -2330 ml   Filed Weights   05/26/24 2124 05/28/24 0452  Weight: 112.7 kg 102.8 kg    Examination:  Physical  Exam Constitutional:      General: She is not in acute distress. Cardiovascular:     Rate and Rhythm: Normal rate and regular rhythm.  Pulmonary:     Effort: Pulmonary effort is normal.     Breath sounds: Rales present.  Abdominal:     Palpations: Abdomen is soft.  Musculoskeletal:     Right lower leg: Edema present.     Left lower leg: Edema present.  Neurological:     General: No focal deficit present.     Mental Status: She is alert and oriented to person, place, and time.  Psychiatric:        Mood and Affect: Mood normal.        Behavior: Behavior normal.          Scheduled Medications:    allopurinol   300 mg Oral Daily   baclofen   5-10 mg Oral BID   calcium -vitamin D   1 tablet Oral BID WC   cyanocobalamin   1,000 mcg Oral Daily   DULoxetine   60 mg Oral BID   feeding supplement  237 mL Oral BID BM   furosemide   40 mg Intravenous Q12H   gabapentin   300 mg Oral QID   levothyroxine   50 mcg Oral Q0600   magnesium  oxide  400 mg Oral Daily   metoprolol  tartrate  100 mg Oral BID   mirabegron  ER  25 mg Oral Daily   pantoprazole   40 mg Oral Daily   potassium chloride   40 mEq Oral Once   Warfarin - Pharmacist Dosing Inpatient   Does not apply q1600    Continuous Infusions:  cefTRIAXone  (ROCEPHIN )  IV 2 g (05/27/24 2036)    PRN Medications:  acetaminophen  **OR** acetaminophen , magnesium  hydroxide, methocarbamol , ondansetron  **OR** ondansetron  (ZOFRAN ) IV, traZODone   Antimicrobials from admission:  Anti-infectives (From admission, onward)    Start     Dose/Rate Route Frequency Ordered Stop   05/27/24 2200  cefTRIAXone  (ROCEPHIN ) 1 g in sodium chloride  0.9 % 100 mL IVPB  Status:  Discontinued        1 g 200 mL/hr over 30 Minutes Intravenous Every 24 hours 05/27/24 0455 05/27/24 1631   05/27/24 2200  cefTRIAXone  (ROCEPHIN ) 2 g in sodium chloride  0.9 % 100 mL IVPB        2 g 200 mL/hr over 30 Minutes Intravenous Every 24 hours 05/27/24 1631     05/26/24 2330  cefTRIAXone  (ROCEPHIN ) 2 g in sodium chloride  0.9 % 100 mL IVPB        2 g 200 mL/hr over 30 Minutes Intravenous  Once 05/26/24 2327 05/27/24 0020           Data Reviewed:  I have personally reviewed the following...  CBC: Recent Labs  Lab 05/26/24 2134 05/27/24 0847 05/28/24 0506  WBC 22.5* 18.2* 11.4*  NEUTROABS 18.3*  --   --   HGB 10.9* 10.0* 10.0*  HCT 32.2* 29.7* 30.1*  MCV 88.5 87.1 87.0  PLT 208 214 223   Basic Metabolic Panel: Recent Labs  Lab 05/26/24 2134 05/27/24 0847 05/28/24 0506  NA 131* 133* 135  K 4.0 3.1* 3.1*  CL 97* 100 98  CO2 23 24 29   GLUCOSE 119* 146* 87  BUN 21 19 19    CREATININE 1.02* 0.81 0.80  CALCIUM  7.5* 7.1* 7.2*   GFR: Estimated Creatinine Clearance: 70.9 mL/min (by C-G formula based on SCr of 0.8 mg/dL). Liver Function Tests: Recent Labs  Lab 05/26/24 2134  AST 63*  ALT 23  ALKPHOS 86  BILITOT 1.3*  PROT 6.4*  ALBUMIN 2.7*   No results for input(s): LIPASE, AMYLASE in the last 168 hours. No results for input(s): AMMONIA in the last 168 hours. Coagulation Profile: Recent Labs  Lab 05/26/24 2237 05/27/24 0847 05/28/24 0506  INR 1.5* 1.6* 1.4*   Cardiac Enzymes: No results for input(s): CKTOTAL, CKMB, CKMBINDEX, TROPONINI in the last 168 hours. BNP (last 3 results) No results for input(s): PROBNP in the last 8760 hours. HbA1C: No results for input(s): HGBA1C in the last 72 hours. CBG: No results for input(s): GLUCAP in the last 168 hours. Lipid Profile: No results for input(s): CHOL, HDL, LDLCALC, TRIG, CHOLHDL, LDLDIRECT in the last 72 hours. Thyroid Function Tests: No results for input(s): TSH, T4TOTAL, FREET4, T3FREE, THYROIDAB in the last 72 hours. Anemia Panel: No results for input(s): VITAMINB12, FOLATE, FERRITIN, TIBC, IRON, RETICCTPCT in the last 72 hours. Most Recent Urinalysis On File:     Component Value Date/Time   COLORURINE YELLOW (A) 05/26/2024 2237   APPEARANCEUR HAZY (A) 05/26/2024 2237   APPEARANCEUR Hazy 05/27/2013 1107   LABSPEC 1.013 05/26/2024 2237   LABSPEC 1.011 05/27/2013 1107   PHURINE 5.0 05/26/2024 2237   GLUCOSEU NEGATIVE 05/26/2024 2237   GLUCOSEU Negative 05/27/2013 1107   HGBUR MODERATE (A) 05/26/2024 2237   BILIRUBINUR NEGATIVE 05/26/2024 2237   BILIRUBINUR Negative 05/27/2013 1107   KETONESUR NEGATIVE 05/26/2024 2237   PROTEINUR NEGATIVE 05/26/2024 2237   NITRITE NEGATIVE 05/26/2024 2237   LEUKOCYTESUR SMALL (A) 05/26/2024 2237   LEUKOCYTESUR 1+ 05/27/2013 1107   Sepsis  Labs: @LABRCNTIP (procalcitonin:4,lacticidven:4) Microbiology: Recent Results (from the past 240 hours)  Blood Culture (routine x 2)     Status: None (Preliminary result)   Collection Time: 05/26/24 10:37 PM   Specimen: BLOOD  Result Value Ref Range Status   Specimen Description BLOOD BLOOD RIGHT ARM  Final   Special Requests   Final    BOTTLES DRAWN AEROBIC AND ANAEROBIC Blood Culture adequate volume   Culture  Setup Time   Final    GRAM NEGATIVE RODS AEROBIC BOTTLE ONLY CRITICAL VALUE NOTED.  VALUE IS CONSISTENT WITH PREVIOUSLY REPORTED AND CALLED VALUE. Performed at [facility], [address] [place], [place] [address]    Culture GRAM NEGATIVE RODS  Final   Report Status PENDING  Incomplete  Blood Culture (routine x 2)     Status: Abnormal (Preliminary result)   Collection Time: 05/26/24 10:37 PM   Specimen: BLOOD  Result Value Ref Range Status   Specimen Description   Final    BLOOD BLOOD LEFT ARM Performed at [facility], [address], [place], [place] [address]    Special Requests   Final    BOTTLES DRAWN AEROBIC AND ANAEROBIC Blood Culture adequate volume Performed at [facility], [address], [place], [place] [address]    Culture  Setup Time   Final    GRAM NEGATIVE RODS AEROBIC BOTTLE ONLY Organism ID to follow CRITICAL RESULT CALLED TO, READ BACK BY AND VERIFIED WITH: EMILY STEINBOCK PHARM.D 05/27/24 1626 KG Performed at [facility], [address] [place], [place] [address]    Culture (A)  Final    ESCHERICHIA COLI CULTURE REINCUBATED FOR BETTER GROWTH Performed at [facility], [address], [place], [place] [address]    Report Status PENDING  Incomplete  Resp panel by RT-PCR (RSV, Flu A&B, Covid) Anterior Nasal Swab     Status: None   Collection Time:  05/26/24 10:37 PM   Specimen: Anterior Nasal Swab  Result Value Ref Range Status   SARS Coronavirus 2 by RT PCR NEGATIVE NEGATIVE Final    Comment:  (NOTE) SARS-CoV-2 target nucleic acids are NOT DETECTED.  The SARS-CoV-2 RNA is generally detectable in upper respiratory specimens during the acute phase of infection. The lowest concentration of SARS-CoV-2 viral copies this assay can detect is 138 copies/mL. A negative result does not preclude SARS-Cov-2 infection and should not be used as the sole basis for treatment or other patient management decisions. A negative result may occur with  improper specimen collection/handling, submission of specimen other than nasopharyngeal swab, presence of viral mutation(s) within the areas targeted by this assay, and inadequate number of viral copies(<138 copies/mL). A negative result must be combined with clinical observations, patient history, and epidemiological information. The expected result is Negative.  Fact Sheet for Patients:  BloggerCourse.com  Fact Sheet for Healthcare Providers:  SeriousBroker.it  This test is no t yet approved or cleared by the [place]  FDA and  has been authorized for detection and/or diagnosis of SARS-CoV-2 by FDA under an Emergency Use Authorization (EUA). This EUA will remain  in effect (meaning this test can be used) for the duration of the COVID-19 declaration under Section 564(b)(1) of the Act, 21 U.S.C.section 360bbb-3(b)(1), unless the authorization is terminated  or revoked sooner.       Influenza A by PCR NEGATIVE NEGATIVE Final   Influenza B by PCR NEGATIVE NEGATIVE Final    Comment: (NOTE) The Xpert Xpress SARS-CoV-2/FLU/RSV plus assay is intended as an aid in the diagnosis of influenza from Nasopharyngeal swab specimens and should not be used as a sole basis for treatment. Nasal washings and aspirates are unacceptable for Xpert Xpress SARS-CoV-2/FLU/RSV testing.  Fact Sheet for Patients: BloggerCourse.com  Fact Sheet for Healthcare  Providers: SeriousBroker.it  This test is not yet approved or cleared by the [place]  FDA and has been authorized for detection and/or diagnosis of SARS-CoV-2 by FDA under an Emergency Use Authorization (EUA). This EUA will remain in effect (meaning this test can be used) for the duration of the COVID-19 declaration under Section 564(b)(1) of the Act, 21 U.S.C. section 360bbb-3(b)(1), unless the authorization is terminated or revoked.     Resp Syncytial Virus by PCR NEGATIVE NEGATIVE Final    Comment: (NOTE) Fact Sheet for Patients: BloggerCourse.com  Fact Sheet for Healthcare Providers: SeriousBroker.it  This test is not yet approved or cleared by the [place]  FDA and has been authorized for detection and/or diagnosis of SARS-CoV-2 by FDA under an Emergency Use Authorization (EUA). This EUA will remain in effect (meaning this test can be used) for the duration of the COVID-19 declaration under Section 564(b)(1) of the Act, 21 U.S.C. section 360bbb-3(b)(1), unless the authorization is terminated or revoked.  Performed at [facility], [address] [place], [place] [address]   Urine Culture     Status: None   Collection Time: 05/26/24 10:37 PM   Specimen: Urine, Clean Catch  Result Value Ref Range Status   Specimen Description   Final    URINE, CLEAN CATCH Performed at [name], [address] [place], [place] [address]    Special Requests   Final    NONE Performed at [facility], [address] [place], [place] [address]    Culture   Final    NO GROWTH Performed at [facility], [address] [place] [address] [place], [place] [address]  Report Status 05/28/2024 FINAL  Final  Blood Culture ID Panel (Reflexed)     Status: Abnormal   Collection Time: 05/26/24 10:37 PM  Result Value Ref Range Status   Enterococcus faecalis NOT DETECTED NOT DETECTED Final    Enterococcus Faecium NOT DETECTED NOT DETECTED Final   Listeria monocytogenes NOT DETECTED NOT DETECTED Final   Staphylococcus species NOT DETECTED NOT DETECTED Final   Staphylococcus aureus (BCID) NOT DETECTED NOT DETECTED Final   Staphylococcus epidermidis NOT DETECTED NOT DETECTED Final   Staphylococcus lugdunensis NOT DETECTED NOT DETECTED Final   Streptococcus species NOT DETECTED NOT DETECTED Final   Streptococcus agalactiae NOT DETECTED NOT DETECTED Final   Streptococcus pneumoniae NOT DETECTED NOT DETECTED Final   Streptococcus pyogenes NOT DETECTED NOT DETECTED Final   A.calcoaceticus-baumannii NOT DETECTED NOT DETECTED Final   Bacteroides fragilis NOT DETECTED NOT DETECTED Final   Enterobacterales DETECTED (A) NOT DETECTED Final    Comment: Enterobacterales represent a large order of gram negative bacteria, not a single organism. CRITICAL RESULT CALLED TO, READ BACK BY AND VERIFIED WITH: EMILY STEINBOCK PHARM.D 05/27/24 1626 KG    Enterobacter cloacae complex NOT DETECTED NOT DETECTED Final   Escherichia coli DETECTED (A) NOT DETECTED Final    Comment: CRITICAL RESULT CALLED TO, READ BACK BY AND VERIFIED WITH: EMILY STEINBOCK PHARM.D 05/27/24 1626 KG    Klebsiella aerogenes NOT DETECTED NOT DETECTED Final   Klebsiella oxytoca NOT DETECTED NOT DETECTED Final   Klebsiella pneumoniae NOT DETECTED NOT DETECTED Final   Proteus species NOT DETECTED NOT DETECTED Final   Salmonella species NOT DETECTED NOT DETECTED Final   Serratia marcescens NOT DETECTED NOT DETECTED Final   Haemophilus influenzae NOT DETECTED NOT DETECTED Final   Neisseria meningitidis NOT DETECTED NOT DETECTED Final   Pseudomonas aeruginosa NOT DETECTED NOT DETECTED Final   Stenotrophomonas maltophilia NOT DETECTED NOT DETECTED Final   Candida albicans NOT DETECTED NOT DETECTED Final   Candida auris NOT DETECTED NOT DETECTED Final   Candida glabrata NOT DETECTED NOT DETECTED Final   Candida krusei NOT DETECTED  NOT DETECTED Final   Candida parapsilosis NOT DETECTED NOT DETECTED Final   Candida tropicalis NOT DETECTED NOT DETECTED Final   Cryptococcus neoformans/gattii NOT DETECTED NOT DETECTED Final   CTX-M ESBL NOT DETECTED NOT DETECTED Final   Carbapenem resistance IMP NOT DETECTED NOT DETECTED Final   Carbapenem resistance KPC NOT DETECTED NOT DETECTED Final   Carbapenem resistance NDM NOT DETECTED NOT DETECTED Final   Carbapenem resist OXA 48 LIKE NOT DETECTED NOT DETECTED Final   Carbapenem resistance VIM NOT DETECTED NOT DETECTED Final    Comment: Performed at [facility], [address] [place], [place] [address]      Radiology Studies last 3 days: DG Chest Portable 1 View Result Date: 05/26/2024 EXAM: 1 VIEW XRAY OF THE CHEST 05/26/2024 10:45:58 PM COMPARISON: 01/12/23. CLINICAL HISTORY: Suspected sepsis, gen weakness. Per er note; Pt arrived by EMS with complaints of possible UTI. Per Aide at home, PCP put Pt on Keflex  and she took her first round of 3 doses today. Pt has Parkinsons and Lymphedema Right leg worse than Left. FINDINGS: LUNGS AND PLEURA: Low lung volumes. No focal consolidation, pleural effusion, or pneumothorax is identified. HEART AND MEDIASTINUM: The cardiomediastinal silhouette stable. Pulmonary vascularity is within normal limits. BONES AND SOFT TISSUES: No acute osseous abnormality. IMPRESSION: 1. No acute process. Electronically signed by: Norman Gatlin MD 05/26/2024 10:49 PM EDT RP Workstation: [id number]   US  Venous Img Lower  Bilateral Result Date: 05/26/2024 CLINICAL DATA:  Bilateral lower extremity edema EXAM: BILATERAL LOWER EXTREMITY VENOUS DOPPLER ULTRASOUND TECHNIQUE: Gray-scale sonography with compression, as well as color and duplex ultrasound, were performed to evaluate the deep venous system(s) from the level of the common femoral vein through the popliteal and proximal calf veins. COMPARISON:  01/19/2021 FINDINGS: VENOUS Normal compressibility of the  common femoral, superficial femoral, and popliteal veins, as well as the visualized calf veins. Visualized portions of profunda femoral vein and great saphenous vein unremarkable. No filling defects to suggest DVT on grayscale or color Doppler imaging. Doppler waveforms show normal direction of venous flow, normal respiratory plasticity and response to augmentation. OTHER None. Limitations: Suboptimal visualization of the calf veins due to subcutaneous edema and patient body habitus. IMPRESSION: 1. No evidence of deep venous thrombosis within either lower extremity. Electronically Signed   By: Ozell Daring M.D.   On: 05/26/2024 22:32      Hardy Harcum, DO [facility] 05/28/2024, 4:52 PM    Dictation software may have been used to generate the above note. Typos may occur and escape review in typed/dictated notes. Please contact Dr Marsa directly for clarity if needed.  Staff may message me via secure chat in Epic  but this may not receive an immediate response,  please page me for urgent matters!  If 7PM-7AM, please contact night coverage [email or web address]

## 2024-05-28 NOTE — Progress Notes (Signed)
 Heart Failure Navigator Progress Note  Assessed for Heart & Vascular TOC clinic readiness.  Patient does not meet criteria due to current [facility] patient of Dr. Marcina Millard, MD.   Navigator will sign off at this time.  Roxy Horseman, RN, BSN [facility] Heart Failure Navigator Secure Chat Only

## 2024-05-28 NOTE — Plan of Care (Signed)

## 2024-05-28 NOTE — TOC Progression Note (Signed)
 Transition of Care [facility]) - Progression Note    Patient Details  Name: KORAIMA ALBERTSEN MRN: [id number] Date of Birth: [birth year]  Transition of Care [facility]) CM/SW Contact  Alfonso Rummer, LCSW Phone Number: 05/28/2024, 12:13 PM  Clinical Narrative:   KEN DELENA Rummer met with Ms. Serano in room 246. Ms. Marseille reports she is active with [facility] and prefers to discharge home rather than SNF placement. Ms. Mustin reports Home Health Aide Bascom Sar is very good and transports pt to medical appts, cooks meals and helps with medications. Ms. Sar reports SNF history with [facility] in [place], [place]. Pt reports she ambulates with assistance of walker and electric wheelchair for further distances. Pt reports no barriers to afford medications and family is supportive.                       Expected Discharge Plan and Services      Pt reports going home with home health is preferred choice.                                          Social Drivers of Health (SDOH) Interventions SDOH Screenings   Food Insecurity: No Food Insecurity (05/27/2024)  Housing: Low Risk  (05/27/2024)  Transportation Needs: No Transportation Needs (05/27/2024)  Utilities: Not At Risk (05/27/2024)  Depression (PHQ2-9): Low Risk  (12/13/2023)  Financial Resource Strain: Low Risk  (01/19/2024)   Received from [facility]  Physical Activity: Insufficiently Active (05/02/2023)   Received from [facility]  Social Connections: Unknown (05/27/2024)  Stress: Stress Concern Present (05/02/2023)   Received from [facility]  Tobacco Use: Low Risk  (05/26/2024)  Health Literacy: Inadequate Health Literacy (05/02/2023)   Received from [facility]    Readmission Risk Interventions     No data to display

## 2024-05-28 NOTE — Plan of Care (Signed)
  Problem: Education: Goal: Knowledge of General Education information will improve Description: Including pain rating scale, medication(s)/side effects and non-pharmacologic comfort measures Outcome: Progressing   Problem: Clinical Measurements: Goal: Respiratory complications will improve Outcome: Progressing   Problem: Clinical Measurements: Goal: Cardiovascular complication will be avoided Outcome: Progressing   Problem: Activity: Goal: Risk for activity intolerance will decrease Outcome: Progressing   Problem: Nutrition: Goal: Adequate nutrition will be maintained Outcome: Progressing   Problem: Pain Managment: Goal: General experience of comfort will improve and/or be controlled Outcome: Progressing   Problem: Safety: Goal: Ability to remain free from injury will improve Outcome: Progressing

## 2024-05-29 DIAGNOSIS — R531 Weakness: Secondary | ICD-10-CM | POA: Diagnosis not present

## 2024-05-29 LAB — BASIC METABOLIC PANEL WITH GFR
Anion gap: 10 (ref 5–15)
BUN: 17 mg/dL (ref 8–23)
CO2: 30 mmol/L (ref 22–32)
Calcium: 7.4 mg/dL — ABNORMAL LOW (ref 8.9–10.3)
Chloride: 98 mmol/L (ref 98–111)
Creatinine, Ser: 0.76 mg/dL (ref 0.44–1.00)
GFR, Estimated: >60 mL/min (ref >60)
Glucose, Bld: 93 mg/dL (ref 70–99)
Potassium: 3.7 mmol/L (ref 3.5–5.1)
Sodium: 138 mmol/L (ref 135–145)

## 2024-05-29 LAB — PROTIME-INR
INR: 1.7 — ABNORMAL HIGH (ref 0.8–1.2)
Prothrombin Time: 21.1 s — ABNORMAL HIGH (ref 11.4–15.2)

## 2024-05-29 MED ORDER — WARFARIN SODIUM 6 MG PO TABS
6.0000 mg | ORAL_TABLET | Freq: Once | ORAL | Status: AC
  Administered 2024-05-29: 6 mg via ORAL
  Filled 2024-05-29: qty 1

## 2024-05-29 MED ORDER — POTASSIUM CHLORIDE CRYS ER 20 MEQ PO TBCR
20.0000 meq | EXTENDED_RELEASE_TABLET | Freq: Once | ORAL | Status: AC
Start: 2024-05-29 — End: 2024-05-29
  Administered 2024-05-29: 20 meq via ORAL
  Filled 2024-05-29: qty 1

## 2024-05-29 NOTE — TOC CM/SW Note (Signed)
 Transition of Care [facility]) - Inpatient Brief Assessment   Patient Details  Name: Suzanne Dixon MRN: [id number] Date of Birth: 1951-09-06  [facility]) CM/SW Contact:    Alfonso Rummer, LCSW Phone Number: 05/29/2024, 1:20 PM   Clinical Narrative: LCSW A. Rummer met with pt in room 246. Pt's daughter was on facetime during LCSW TOC visit. Pt daughter current resides in [place]. Pt reports she currently own 1100.00 to [facility] in [place] and would like to assess for medicaid to assist with financial cost of snf. Pt reports being active with home health and prefers to go home however she is receptive to snf placement for short term rehab only. Pt daughter encouraged pt to consider SNF placement. LCSW A Rummer urged pt daughter to collaborate with home health and other family members to develop a family support plan to assist pt when she is released from snf. Finance dept acknowledge they will meet with pt to assess for [place] Medicaid.    Transition of Care Asessment: Insurance and Status: Insurance coverage has been reviewed Patient has primary care physician: Yes (Dr Fernande)     Prior/Current Home Services: Current home services Social Drivers of Health Review: SDOH reviewed no interventions necessary Readmission risk has been reviewed: Yes Transition of care needs: transition of care needs identified, TOC will continue to follow

## 2024-05-29 NOTE — Progress Notes (Signed)
 Progress Note   Patient: Suzanne Dixon FMW:[id number] DOB: 1951-08-23 DOA: 05/26/2024     2 DOS: the patient was seen and examined on 05/29/2024   Brief hospital course:  HPI: Suzanne Dixon is a 73 y.o. African-American female with medical history significant for paroxysmal atrial fibrillation, vitamin B12 deficiency, depression, GERD, gout, hypertension, hypothyroidism, and OSA, who presented to the emergency room with acute onset of generalized weakness with loss of appetite and increased lymphedema.  She has been recently treated for UTI with p.o. Keflex .  She continues to have dysuria, urinary frequency and urgency.    09/08: admitted to hospitalist for UTI and concern for CHF> BCx (+)Ecoli.  09/09: continuing abx pending susceptibilities     ASSESSMENT & PLAN:   BCx x1 (+)EColi  Pending susceptibilities  UCx no growth but she was on abx prior to collection of specimen    Hypokalemia Replace as needed Monitor BMP   Lymphedema Acute on chronic HFpEF  Essential HTN  Most recent 2D echo revealed an EF of 50 to 55% with grade 1 diastolic function, revealed mitral valve regurgitation and moderate tricuspid valve regurgitation.  Diuresing  I&O Other rx: metoprolol . On coumadin  so no ASA>    Paroxysmal Afib Coumadin   Metoprolol     GERD PPI   Hypothyroid Synthroid    Gout Allopurinol    Anx/Dep Home Cymbalta  trazodone  prn   Neuropathy, pain Gabapentin , robaxin     Class 3 morbid obesity based on BMI: Body mass index is 42.65 kg/m.     DVT prophylaxis: coumadin  IV fluids: no continuous IV fluids  Nutrition: regular Central lines / other devices: none   Code Status: FULL CODE  TOC needs: SNF  Physical Exam Constitutional:      General: She is not in acute distress. Cardiovascular:     Rate and Rhythm: Normal rate and regular rhythm.  Pulmonary:     Effort: Pulmonary effort is normal.     Breath sounds: Rales present.  Abdominal:     Palpations: Abdomen  is soft.  Musculoskeletal:     Right lower leg: Edema present.     Left lower leg: Edema present.  Neurological:     General: No focal deficit present.     Mental Status: She is alert and oriented to person, place, and time.  Psychiatric:        Mood and Affect: Mood normal.        Behavior: Behavior normal.   Data reviewed:  Vitals:   05/29/24 0946 05/29/24 1000 05/29/24 1127 05/29/24 1530  BP: (!) 143/92  98/81 (!) 139/96  Pulse: 76  85 93  Resp:  19 18 20   Temp:   97.7 F (36.5 C) 97.8 F (36.6 C)  TempSrc:    Oral  SpO2:   98% 96%  Weight:      Height:          Latest Ref Rng & Units 05/28/2024    5:06 AM 05/27/2024    8:47 AM 05/26/2024    9:34 PM  CBC  WBC 4.0 - 10.5 K/uL 11.4  18.2  22.5   Hemoglobin 12.0 - 15.0 g/dL 89.9  89.9  89.0   Hematocrit 36.0 - 46.0 % 30.1  29.7  32.2   Platelets 150 - 400 K/uL 223  214  208        Latest Ref Rng & Units 05/29/2024    5:29 AM 05/28/2024    5:06 AM 05/27/2024    8:47 AM  BMP  Glucose 70 - 99 mg/dL 93  87  853   BUN 8 - 23 mg/dL 17  19  19    Creatinine 0.44 - 1.00 mg/dL 9.23  9.19  9.18   Sodium 135 - 145 mmol/L 138  135  133   Potassium 3.5 - 5.1 mmol/L 3.7  3.1  3.1   Chloride 98 - 111 mmol/L 98  98  100   CO2 22 - 32 mmol/L 30  29  24    Calcium  8.9 - 10.3 mg/dL 7.4  7.2  7.1       Author: Drue ONEIDA Potter, MD 05/29/2024 4:41 PM  For on call review [email or web address].

## 2024-05-29 NOTE — Care Management Important Message (Signed)
 Important Message  Patient Details  Name: Suzanne Dixon MRN: [id number] Date of Birth: 07/08/1951   Important Message Given:  Yes - Medicare IM     Rojelio SHAUNNA Rattler 05/29/2024, 1:36 PM

## 2024-05-29 NOTE — Consult Note (Signed)
 PHARMACY - ANTICOAGULATION CONSULT NOTE  Pharmacy Consult for Warfarin Indication: atrial fibrillation  Allergies  Allergen Reactions   Advair Diskus [Fluticasone-Salmeterol] Other (See Comments)    Joint pain    Alendronate     Muscle pain   Singulair [Montelukast] Other (See Comments)    muscle pain   Sulfa Antibiotics Hives   Venlafaxine Other (See Comments)    altered mental status/tremors    Patient Measurements: Height: 5' 4 (162.6 cm) Weight: 100 kg (220 lb 7.4 oz) IBW/kg (Calculated) : 54.7 HEPARIN  DW (KG): 81.7  Vital Signs: Temp: 98.4 F (36.9 C) (09/10 0358) BP: 145/74 (09/10 0358) Pulse Rate: 76 (09/10 0358)  Labs: Recent Labs    05/26/24 2134 05/26/24 2237 05/26/24 2355 05/27/24 0847 05/28/24 0506 05/29/24 0529  HGB 10.9*  --   --  10.0* 10.0*  --   HCT 32.2*  --   --  29.7* 30.1*  --   PLT 208  --   --  214 223  --   LABPROT  --    < >  --  20.4* 17.5* 21.1*  INR  --    < >  --  1.6* 1.4* 1.7*  CREATININE 1.02*  --   --  0.81 0.80 0.76  TROPONINIHS 115*  --  122*  --   --   --    < > = values in this interval not displayed.    Estimated Creatinine Clearance: 69.8 mL/min (by C-G formula based on SCr of 0.76 mg/dL).   Medical History: Past Medical History:  Diagnosis Date   A-fib (HCC)    Anemia    B12 deficiency    Breast cancer (HCC) 03/31/2013   Right - chemo- mastectomy   Breast cancer (HCC) 1991   Rt.- radiation   Complication of anesthesia 01/2009   Recent years with general anesthesia had itching following surgery   DDD (degenerative disc disease), cervical    Depression    Diverticulitis    Dyspnea    Edema of both legs    GERD (gastroesophageal reflux disease)    Gout    H/O mastectomy 2014   per patient   H/O: cesarean section 1987   per patient report   H/O: hysterectomy 1996   per patient   Hydradenitis    Hypertension    Hypothyroidism    Neuropathy    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HCC)    Parkinson's disease  (HCC)    Personal history of chemotherapy    Personal history of radiation therapy    Ruptured cervical disc 2002   per patient    Sleep apnea    cpap machine   Super obese    Tachycardia     Medications:  Warfarin 6 mg Mon/Wed/Fri and 3 mg EOD  Assessment: 73 yo female presenting to ED with complaint of generalized weakness and dysuria.  PMH includes Parkinson's, Afib, HTN, GERD, Gout, and lymphedema.  Pharmacy consulted to manage warfarin dosing for Afib. CHADSVASc 6 (age, sex, CVA, HTN).     Goal of Therapy:  INR 2-3 Monitor platelets by anticoagulation protocol: Yes    Date INR Warfarin Dose  9/7 1.5 --  9/8 1.6 7.5 mg  9/9 1.4 7.5 mg  9/10 1.7 6 mg     Plan:  INR is subtherapeutic, but trending up. Will give warfarin 6 mg x 1 (home dose). Predict INR will continue trending up. Daily INR. CBC at least every 3 days. Will defer enoxaparin  bridge  as INR trends up. If INR starts trending down, possibly start a enoxaparin  bridge until INR > 2.   Cathaleen GORMAN Blanch, PharmD, BCPS 05/29/2024,7:40 AM

## 2024-05-29 NOTE — NC FL2 (Signed)
 [place]  MEDICAID [id number] LEVEL OF CARE FORM     IDENTIFICATION  Patient Name: Suzanne Dixon Birthdate: 06/20/1951 Sex: female Admission Date (Current Location): 05/26/2024  [facility] and [place] Number:  Chiropodist and Address:  [facility], [address], [place], [place] [address]      Provider Number: [id number]  Attending Physician Name and Address:  Dorinda Drue DASEN, MD  Relative Name and Phone Number:  [name] Greenland [phone]    Current Level of Care: Hospital Recommended Level of Care: Skilled Nursing Facility Prior Approval Number:    Date Approved/Denied: 07/28/23 (pasrr approval date) PASRR Number: [id number]  Discharge Plan: SNF    Current Diagnoses: Patient Active Problem List   Diagnosis Date Noted   Generalized weakness 05/27/2024   Essential hypertension 05/27/2024   Paroxysmal atrial fibrillation (HCC) 05/27/2024   GERD without esophagitis 05/27/2024   Thoracic spondylosis with myelopathy 07/06/2023   Weakness of both lower extremities 07/06/2023   Spinal stenosis, thoracic 07/06/2023   Spinal stenosis of thoracolumbar region with radiculopathy 07/06/2023   Acute cystitis 06/29/2023   Acute back pain 06/29/2023   Pressure injury of skin 06/20/2022   Sepsis secondary to UTI (HCC) 06/19/2022   AMS (altered mental status) 06/19/2022   Parkinsonism (HCC) 05/30/2022   Acute metabolic encephalopathy 05/20/2022   Thoracic degenerative disc disease 08/24/2021   Aortic atherosclerosis (HCC) 08/23/2021   COVID-19 virus infection 06/19/2021   CVA (cerebral vascular accident) (HCC) 02/01/2021   Hypothyroidism    Left leg cellulitis    Acquired thrombophilia (HCC) 12/10/2020   Status post reverse total shoulder replacement, 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   Lymphedema 07/17/2020   Right rotator cuff tear arthropathy 04/07/2020   Edema of both legs 12/31/2019   Polyneuropathy associated with underlying disease (HCC) 12/31/2019   Swelling  of limb 12/31/2019   Hematuria, microscopic 06/07/2018   Age-related osteoporosis without current pathological fracture 12/08/2017   Rotator cuff arthropathy, right 01/03/2017   Biceps tendinitis of right upper extremity 12/05/2016   Complete tear of right rotator cuff 12/05/2016   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   History of breast cancer in female 06/02/2016   Morbid obesity (HCC) 12/01/2015   Allergy 04/01/2015   Arthritis 04/01/2015   Cancer (HCC) 04/01/2015   Edema leg 04/01/2015   Gout 04/01/2015   Hidradenitis 04/01/2015   Elevated lymphocyte count 04/01/2015   Disorder of peripheral nervous system 04/01/2015   Apnea, sleep 04/01/2015   Avitaminosis D 04/01/2015   Breast cancer (HCC) 04/01/2015   Hypertension 04/15/2014   B12 deficiency 02/24/2014   AF (paroxysmal atrial fibrillation) (HCC) 02/17/2014   Obesity (BMI 30-39.9) 06/04/2012   Depression, major, recurrent, moderate (HCC) 03/26/2012    Orientation RESPIRATION BLADDER Height & Weight     Self, Time, Situation, Place  Normal External catheter Weight: 220 lb 7.4 oz (100 kg) Height:  5' 4 (162.6 cm)  BEHAVIORAL SYMPTOMS/MOOD NEUROLOGICAL BOWEL NUTRITION STATUS      Continent Diet (regular diet fluid consistency)  AMBULATORY STATUS COMMUNICATION OF NEEDS Skin   Extensive Assist Verbally Normal                       Personal Care Assistance Level of Assistance  Bathing, Dressing Bathing Assistance: Limited assistance   Dressing Assistance: Limited assistance     Functional Limitations Info             SPECIAL CARE FACTORS  FREQUENCY  PT (By licensed PT)     PT Frequency: 5x a week              Contractures Contractures Info: Not present    Additional Factors Info                  Current Medications (05/29/2024):  This is the current hospital active medication list Current Facility-Administered  Medications  Medication Dose Route Frequency Provider Last Rate Last Admin   acetaminophen  (TYLENOL ) tablet 650 mg  650 mg Oral Q6H PRN Mansy, Jan A, MD   650 mg at 05/29/24 0957   Or   acetaminophen  (TYLENOL ) suppository 650 mg  650 mg Rectal Q6H PRN Mansy, Jan A, MD       allopurinol  (ZYLOPRIM ) tablet 300 mg  300 mg Oral Daily Mansy, Jan A, MD   300 mg at 05/29/24 0946   baclofen  (LIORESAL ) tablet 5-10 mg  5-10 mg Oral BID Mansy, Jan A, MD   10 mg at 05/29/24 0946   calcium -vitamin D  (OSCAL WITH D) 500-5 MG-MCG per tablet 1 tablet  1 tablet Oral BID WC Mansy, Jan A, MD   1 tablet at 05/29/24 0946   cefTRIAXone  (ROCEPHIN ) 2 g in sodium chloride  0.9 % 100 mL IVPB  2 g Intravenous Q24H Alexander, Natalie, DO   Stopped at 05/28/24 2155   cyanocobalamin  (VITAMIN B12) tablet 1,000 mcg  1,000 mcg Oral Daily Mansy, Jan A, MD   1,000 mcg at 05/29/24 0948   DULoxetine  (CYMBALTA ) DR capsule 60 mg  60 mg Oral BID Mansy, Jan A, MD   60 mg at 05/29/24 0948   feeding supplement (ENSURE PLUS HIGH PROTEIN) liquid 237 mL  237 mL Oral BID BM Alexander, Natalie, DO   237 mL at 05/29/24 0953   furosemide  (LASIX ) injection 40 mg  40 mg Intravenous Q12H Mansy, Jan A, MD   40 mg at 05/29/24 0945   gabapentin  (NEURONTIN ) capsule 300 mg  300 mg Oral QID Mansy, Jan A, MD   300 mg at 05/29/24 0948   levothyroxine  (SYNTHROID ) tablet 50 mcg  50 mcg Oral Q0600 Mansy, Jan A, MD   50 mcg at 05/29/24 0652   magnesium  hydroxide (MILK OF MAGNESIA) suspension 30 mL  30 mL Oral Daily PRN Mansy, Jan A, MD       magnesium  oxide (MAG-OX) tablet 400 mg  400 mg Oral Daily Mansy, Jan A, MD   400 mg at 05/29/24 0946   methocarbamol  (ROBAXIN ) tablet 500 mg  500 mg Oral Q6H PRN Mansy, Jan A, MD   500 mg at 05/28/24 1550   metoprolol  tartrate (LOPRESSOR ) tablet 100 mg  100 mg Oral BID Mansy, Jan A, MD   100 mg at 05/29/24 0946   mirabegron  ER (MYRBETRIQ ) tablet 25 mg  25 mg Oral Daily Mansy, Jan A, MD   25 mg at 05/29/24 9051   ondansetron   (ZOFRAN ) tablet 4 mg  4 mg Oral Q6H PRN Mansy, Jan A, MD       Or   ondansetron  (ZOFRAN ) injection 4 mg  4 mg Intravenous Q6H PRN Mansy, Jan A, MD       pantoprazole  (PROTONIX ) EC tablet 40 mg  40 mg Oral Daily Mansy, Jan A, MD   40 mg at 05/29/24 9052   traZODone  (DESYREL ) tablet 25 mg  25 mg Oral QHS PRN Mansy, Jan A, MD       warfarin (COUMADIN ) tablet 6 mg  6 mg Oral  LEOBARDO Tobie Cathaleen GORMAN, [facility]       Warfarin - Pharmacist Dosing Inpatient   Does not apply q1600 Dail Rankin GORMAN [facility]   Given at 05/27/24 1810     Discharge Medications: Please see discharge summary for a list of discharge medications.  Relevant Imaging Results:  Relevant Lab Results:   Additional Information    Alfonso Rummer, LCSW

## 2024-05-30 DIAGNOSIS — R531 Weakness: Secondary | ICD-10-CM | POA: Diagnosis not present

## 2024-05-30 LAB — CULTURE, BLOOD (ROUTINE X 2)
Special Requests: ADEQUATE
Special Requests: ADEQUATE

## 2024-05-30 LAB — PROTIME-INR
INR: 2 — ABNORMAL HIGH (ref 0.8–1.2)
Prothrombin Time: 23.6 s — ABNORMAL HIGH (ref 11.4–15.2)

## 2024-05-30 MED ORDER — WARFARIN SODIUM 6 MG PO TABS: 6.0000 mg | ORAL_TABLET | Freq: Once | ORAL | Status: DC

## 2024-05-30 MED ORDER — WARFARIN SODIUM 3 MG PO TABS
3.0000 mg | ORAL_TABLET | Freq: Once | ORAL | Status: AC
  Administered 2024-05-30: 3 mg via ORAL
  Filled 2024-05-30: qty 1

## 2024-05-30 MED ORDER — AMOXICILLIN 500 MG PO CAPS
1000.0000 mg | ORAL_CAPSULE | Freq: Three times a day (TID) | ORAL | Status: DC
Start: 2024-05-30 — End: 2024-06-03
  Administered 2024-05-30 – 2024-06-01 (×5): 1000 mg via ORAL
  Filled 2024-05-30 (×7): qty 2

## 2024-05-30 NOTE — Consult Note (Addendum)
 PHARMACY - ANTICOAGULATION CONSULT NOTE  Pharmacy Consult for Warfarin Indication: atrial fibrillation  Allergies  Allergen Reactions   Advair Diskus [Fluticasone-Salmeterol] Other (See Comments)    Joint pain    Alendronate     Muscle pain   Singulair [Montelukast] Other (See Comments)    muscle pain   Sulfa Antibiotics Hives   Venlafaxine Other (See Comments)    altered mental status/tremors    Patient Measurements: Height: 5' 4 (162.6 cm) Weight: 95.6 kg (210 lb 12.2 oz) IBW/kg (Calculated) : 54.7 HEPARIN  DW (KG): 81.7  Vital Signs: Temp: 98.6 F (37 C) (09/11 0353) BP: 159/86 (09/11 0353) Pulse Rate: 75 (09/11 0353)  Labs: Recent Labs    05/27/24 0847 05/28/24 0506 05/29/24 0529 05/30/24 0424  HGB 10.0* 10.0*  --   --   HCT 29.7* 30.1*  --   --   PLT 214 223  --   --   LABPROT 20.4* 17.5* 21.1* 23.6*  INR 1.6* 1.4* 1.7* 2.0*  CREATININE 0.81 0.80 0.76  --     Estimated Creatinine Clearance: 68.2 mL/min (by C-G formula based on SCr of 0.76 mg/dL).   Medical History: Past Medical History:  Diagnosis Date   A-fib (HCC)    Anemia    B12 deficiency    Breast cancer (HCC) 03/31/2013   Right - chemo- mastectomy   Breast cancer (HCC) 1991   Rt.- radiation   Complication of anesthesia 01/2009   Recent years with general anesthesia had itching following surgery   DDD (degenerative disc disease), cervical    Depression    Diverticulitis    Dyspnea    Edema of both legs    GERD (gastroesophageal reflux disease)    Gout    H/O mastectomy 2014   per patient   H/O: cesarean section 1987   per patient report   H/O: hysterectomy 1996   per patient   Hydradenitis    Hypertension    Hypothyroidism    Neuropathy    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HCC)    Parkinson's disease (HCC)    Personal history of chemotherapy    Personal history of radiation therapy    Ruptured cervical disc 2002   per patient    Sleep apnea    cpap machine   Super obese     Tachycardia     Medications:  Warfarin 6 mg Mon/Wed/Fri and 3 mg EOD  Assessment: 73 yo female presenting to ED with complaint of generalized weakness and dysuria.  PMH includes Parkinson's, Afib, HTN, GERD, Gout, and lymphedema.  Pharmacy consulted to manage warfarin dosing for Afib. CHADSVASc 6 (age, sex, CVA, HTN).     Goal of Therapy:  INR 2-3 Monitor platelets by anticoagulation protocol: Yes    Date INR Warfarin Dose  9/7 1.5 --  9/8 1.6 7.5 mg  9/9 1.4 7.5 mg  9/10 1.7 6 mg  9/11 2.0      Plan:  INR is subtherapeutic, but trending up. Will give warfarin 3 mg x 1 (home dose). Predict INR will continue trending up. Daily INR. CBC at least every 3 days. Will defer enoxaparin  bridge as INR trends up. If INR starts trending down, possibly start a enoxaparin  bridge until INR > 2.   Gwenda Heiner A Braxton Weisbecker, PharmD Clinical Pharmacist 05/30/2024 7:49 AM

## 2024-05-30 NOTE — TOC Progression Note (Signed)
 Transition of Care [facility]) - Progression Note    Patient Details  Name: Suzanne Dixon MRN: [id number] Date of Birth: 08/10/1951  Transition of [facility]) CM/SW Contact  Lauraine JAYSON Carpen, LCSW Phone Number: 05/30/2024, 1:22 PM  Clinical Narrative:   Patient would have to pay balance prior to admit to [facility]. Patient is agreeable to seeing what other options she has. CSW sent SNF referral to local facilities.  Expected Discharge Plan and Services                                               Social Drivers of Health (SDOH) Interventions SDOH Screenings   Food Insecurity: No Food Insecurity (05/27/2024)  Housing: Low Risk  (05/27/2024)  Transportation Needs: No Transportation Needs (05/27/2024)  Utilities: Not At Risk (05/27/2024)  Depression (PHQ2-9): Low Risk  (12/13/2023)  Financial Resource Strain: Low Risk  (01/19/2024)   Received from [facility]  Physical Activity: Insufficiently Active (05/02/2023)   Received from [facility]  Social Connections: Unknown (05/27/2024)  Stress: Stress Concern Present (05/02/2023)   Received from [facility]  Tobacco Use: Low Risk  (05/26/2024)  Health Literacy: Inadequate Health Literacy (05/02/2023)   Received from [facility]    Readmission Risk Interventions     No data to display

## 2024-05-30 NOTE — Plan of Care (Signed)

## 2024-05-30 NOTE — Progress Notes (Signed)
 Physical Therapy Treatment Patient Details Name: Suzanne Dixon MRN: [id number] DOB: 12-08-1950 Today's Date: 05/30/2024   History of Present Illness inda B Fineberg is a 73 y.o. female with medical history significant for paroxysmal atrial fibrillation, vitamin B12 deficiency, depression, GERD, gout, hypertension, hypothyroidism, and OSA, who presented to the emergency room with acute onset of generalized weakness with loss of appetite and increased lymphedema. Pt with Recent h/o of spine surgery in October 2024. Pt was receiving Home PT/OT until this ED visit.    PT Comments  Pt resting in bed upon PT arrival; pt agreeable to therapy.  Pt reporting chronic R hip pain with movement (but none at rest).  Pt seen for transfer training and activities to improve strength and endurance.  During session pt was min assist to stand from mildly elevated bed height up to RW (pt unable to stand from bed at regular height with 1 assist and cueing); CGA to take steps bed to recliner with RW use; and min assist to stand from recliner up to bariatric RW.  Pt requiring pacing and rest breaks with activities d/t pt fatigue, generalized weakness, and SOB with minimal activity.  Limited session d/t pt requesting to rest (d/t fatigue).  Will continue to focus on strengthening, activity tolerance, and functional mobility during hospitalization.   If plan is discharge home, recommend the following: A lot of help with walking and/or transfers;A lot of help with bathing/dressing/bathroom;Assistance with cooking/housework;Direct supervision/assist for medications management;Assist for transportation;Help with stairs or ramp for entrance   Can travel by private vehicle        Equipment Recommendations  None recommended by PT (Pt has needed DME at home already)    Recommendations for Other Services       Precautions / Restrictions Precautions Precautions: Fall Recall of Precautions/Restrictions:  Intact Restrictions Weight Bearing Restrictions Per Provider Order: No     Mobility  Bed Mobility Overal bed mobility: Needs Assistance Bed Mobility: Supine to Sit     Supine to sit: Min assist, Used rails     General bed mobility comments: assist for trunk; vc's to scoot to edge of bed    Transfers Overall transfer level: Needs assistance Equipment used: Rolling walker (2 wheels) Transfers: Sit to/from Stand, Bed to chair/wheelchair/BSC Sit to Stand: Min assist   Step pivot transfers: Contact guard assist       General transfer comment: pt unable to stand from bed at regular height with 1 assist and pt requesting bed height to bed raised; min assist to stand from mildly elevated bed height; CGA stand step turn bed to recliner with RW use; min assist to stand from recliner up to RW    Ambulation/Gait                   Stairs             Wheelchair Mobility     Tilt Bed    Modified Rankin (Stroke Patients Only)       Balance Overall balance assessment: Needs assistance Sitting-balance support: No upper extremity supported, Feet supported Sitting balance-Leahy Scale: Good Sitting balance - Comments: steady reaching within BOS   Standing balance support: Bilateral upper extremity supported, Reliant on assistive device for balance Standing balance-Leahy Scale: Fair Standing balance comment: steady static standing with B UE support on RW  Communication Communication Communication: No apparent difficulties  Cognition Arousal: Alert Behavior During Therapy: WFL for tasks assessed/performed   PT - Cognitive impairments: No apparent impairments                         Following commands: Intact      Cueing Cueing Techniques: Verbal cues  Exercises      General Comments  Nursing cleared pt for participation in physical therapy.  Pt agreeable to PT session.      Pertinent Vitals/Pain Pain  Assessment Pain Assessment: Faces Faces Pain Scale: Hurts little more (4/10 with movement; no pain at rest) Pain Location: R hip laterally and medially (pt reports having this pain since spinal surgery October 2024) Pain Descriptors / Indicators: Discomfort Pain Intervention(s): Limited activity within patient's tolerance, Monitored during session, Repositioned HR 80-91 bpm and SpO2 sats 100% on room air during session.    Home Living                          Prior Function            PT Goals (current goals can now be found in the care plan section) Acute Rehab PT Goals Patient Stated Goal:  I want to get stronger so I can go home alone. PT Goal Formulation: With patient Time For Goal Achievement: 06/10/24 Potential to Achieve Goals: Good Progress towards PT goals: Progressing toward goals    Frequency    Min 1X/week      PT Plan      Co-evaluation              AM-PAC PT 6 Clicks Mobility   Outcome Measure  Help needed turning from your back to your side while in a flat bed without using bedrails?: A Little Help needed moving from lying on your back to sitting on the side of a flat bed without using bedrails?: A Little Help needed moving to and from a bed to a chair (including a wheelchair)?: A Little Help needed standing up from a chair using your arms (e.g., wheelchair or bedside chair)?: A Lot Help needed to walk in hospital room?: A Lot Help needed climbing 3-5 steps with a railing? : Total 6 Click Score: 14    End of Session Equipment Utilized During Treatment: Gait belt Activity Tolerance: Patient limited by fatigue Patient left: in chair;with call bell/phone within reach;with chair alarm set;Other (comment) (B LE's elevated; purewick in place) Nurse Communication: Mobility status;Precautions PT Visit Diagnosis: History of falling (Z91.81);Muscle weakness (generalized) (M62.81);Difficulty in walking, not elsewhere classified  (R26.2);Pain Pain - Right/Left: Right Pain - part of body: Hip     Time: 0920-0949 PT Time Calculation (min) (ACUTE ONLY): 29 min  Charges:    $Therapeutic Activity: 23-37 mins PT General Charges $$ ACUTE PT VISIT: 1 Visit                     Damien Caulk, PT 05/30/24, 11:31 AM

## 2024-05-30 NOTE — Progress Notes (Signed)
 Progress Note   Patient: Suzanne Dixon FMW:[id number] DOB: 05/07/1951 DOA: 05/26/2024     3 DOS: the patient was seen and examined on 05/30/2024     Brief hospital course:  HPI: Suzanne Dixon is a 73 y.o. African-American female with medical history significant for paroxysmal atrial fibrillation, vitamin B12 deficiency, depression, GERD, gout, hypertension, hypothyroidism, and OSA, who presented to the emergency room with acute onset of generalized weakness with loss of appetite and increased lymphedema.  She has been recently treated for UTI with p.o. Keflex .  She continues to have dysuria, urinary frequency and urgency.    09/08: admitted to hospitalist for UTI and concern for CHF> BCx (+)Ecoli.  09/09: continuing abx pending susceptibilities     ASSESSMENT & PLAN:   E. coli bacteremia Will complete 7 days course of antibiotics I have discussed with infectious disease pharmacist TOC working on skilled nursing facility   Hypokalemia Replace as needed Monitor BMP   Lymphedema Acute on chronic HFpEF  Essential HTN  Most recent 2D echo revealed an EF of 50 to 55% with grade 1 diastolic function, revealed mitral valve regurgitation and moderate tricuspid valve regurgitation.  Diuresing  I&O Other rx: metoprolol . On coumadin  so no ASA>    Paroxysmal Afib Coumadin   Metoprolol     GERD PPI   Hypothyroid Synthroid    Gout Allopurinol    Anx/Dep Home Cymbalta  trazodone  prn   Neuropathy, pain Gabapentin , robaxin     Class 3 morbid obesity based on BMI: Body mass index is 42.65 kg/m.     DVT prophylaxis: coumadin  IV fluids: no continuous IV fluids  Nutrition: regular Central lines / other devices: none   Code Status: FULL CODE   TOC needs: SNF   Physical Exam Constitutional:      General: She is not in acute distress. Cardiovascular:     Rate and Rhythm: Normal rate and regular rhythm.  Pulmonary:     Effort: Pulmonary effort is normal.     Breath sounds:  Rales present.  Abdominal:     Palpations: Abdomen is soft.  Musculoskeletal: Edema improved Neurological:     General: No focal deficit present.     Mental Status: She is alert and oriented to person, place, and time.  Psychiatric:        Mood and Affect: Mood normal.        Behavior: Behavior normal.   Data reviewed:    Latest Ref Rng & Units 05/28/2024    5:06 AM 05/27/2024    8:47 AM 05/26/2024    9:34 PM  CBC  WBC 4.0 - 10.5 K/uL 11.4  18.2  22.5   Hemoglobin 12.0 - 15.0 g/dL 89.9  89.9  89.0   Hematocrit 36.0 - 46.0 % 30.1  29.7  32.2   Platelets 150 - 400 K/uL 223  214  208        Latest Ref Rng & Units 05/29/2024    5:29 AM 05/28/2024    5:06 AM 05/27/2024    8:47 AM  BMP  Glucose 70 - 99 mg/dL 93  87  853   BUN 8 - 23 mg/dL 17  19  19    Creatinine 0.44 - 1.00 mg/dL 9.23  9.19  9.18   Sodium 135 - 145 mmol/L 138  135  133   Potassium 3.5 - 5.1 mmol/L 3.7  3.1  3.1   Chloride 98 - 111 mmol/L 98  98  100   CO2 22 - 32 mmol/L 30  29  24   Calcium  8.9 - 10.3 mg/dL 7.4  7.2  7.1       Vitals:   05/30/24 0500 05/30/24 0928 05/30/24 1200 05/30/24 1555  BP:  138/71 122/77 123/81  Pulse:  81 73 80  Resp:  18 19 18   Temp:  98.2 F (36.8 C) 97.9 F (36.6 C) 98.4 F (36.9 C)  TempSrc:      SpO2:  100% 100% 98%  Weight: 95.6 kg     Height:        Author: Drue ONEIDA Potter, MD 05/30/2024 5:43 PM  For on call review [email or web address].

## 2024-05-31 DIAGNOSIS — R531 Weakness: Secondary | ICD-10-CM | POA: Diagnosis not present

## 2024-05-31 LAB — PROTIME-INR
INR: 2.2 — ABNORMAL HIGH (ref 0.8–1.2)
Prothrombin Time: 25.1 s — ABNORMAL HIGH (ref 11.4–15.2)

## 2024-05-31 MED ORDER — WARFARIN SODIUM 6 MG PO TABS
6.0000 mg | ORAL_TABLET | Freq: Once | ORAL | Status: AC
  Administered 2024-05-31: 6 mg via ORAL
  Filled 2024-05-31: qty 1

## 2024-05-31 NOTE — Progress Notes (Signed)
 Physical Therapy Treatment Patient Details Name: Suzanne Dixon MRN: [id number] DOB: 01-12-1951 Today's Date: 05/31/2024   History of Present Illness Suzanne Dixon is a 73 y.o. female with medical history significant for paroxysmal atrial fibrillation, vitamin B12 deficiency, depression, GERD, gout, hypertension, hypothyroidism, and OSA, who presented to the emergency room with acute onset of generalized weakness with loss of appetite and increased lymphedema. Pt with Recent h/o of spine surgery in October 2024. Pt was receiving Home PT/OT until this ED visit.    PT Comments  Pt resting in bed upon PT arrival; pt agreeable to therapy.  Session focused on bed mobility, transfer training, and functional strengthening/endurance.  Improved transfers (with less assist) noted today and improved activity tolerance in general noted (decreased SOB and decreased length of rest between activities noted).  Pt reporting decreased B LE edema.  Will continue to focus on strengthening, activity tolerance, and modified independence with functional mobility during hospitalization.  Pt reports plan to discharge home (pt educated on recommended assist and modifications for safe home discharge based on pt's current functional status and assist needs; pt verbalizing understanding and that she has this assist available).   If plan is discharge home, recommend the following: A lot of help with bathing/dressing/bathroom;Assistance with cooking/housework;Direct supervision/assist for medications management;Assist for transportation;Help with stairs or ramp for entrance;A little help with walking and/or transfers   Can travel by private vehicle     No  Equipment Recommendations  None recommended by PT (Pt has needed DME at home already)    Recommendations for Other Services       Precautions / Restrictions Precautions Precautions: Fall Recall of Precautions/Restrictions: Intact Restrictions Weight Bearing Restrictions  Per Provider Order: No     Mobility  Bed Mobility Overal bed mobility: Needs Assistance Bed Mobility: Supine to Sit     Supine to sit: Supervision, HOB elevated, Used rails Sit to supine: Min assist (assist for R LE)   General bed mobility comments: increased effort and time for pt to perform as much as possible on own    Transfers Overall transfer level: Needs assistance Equipment used: Rolling walker (2 wheels) Transfers: Sit to/from Stand Sit to Stand: Contact guard assist, Supervision           General transfer comment: CGA x2 trials progressing to SBA x3 trials standing from mildly elevated bed height (simulating height of surfaces at home)    Ambulation/Gait Ambulation/Gait assistance: Contact guard assist, Supervision Gait Distance (Feet):  (sidestep to R along bed a few feet with CGA; x10 steps in place B LE's with SBA) Assistive device: Rolling walker (2 wheels)   Gait velocity: decreased     General Gait Details: decreased B LE step length; steady with bariatric RW use   Stairs             Wheelchair Mobility     Tilt Bed    Modified Rankin (Stroke Patients Only)       Balance Overall balance assessment: Needs assistance Sitting-balance support: No upper extremity supported, Feet supported Sitting balance-Leahy Scale: Good Sitting balance - Comments: steady reaching within BOS   Standing balance support: Bilateral upper extremity supported, Reliant on assistive device for balance Standing balance-Leahy Scale: Good Standing balance comment: steady taking steps in standing with B UE support on RW                            Communication Communication Communication: No  apparent difficulties  Cognition Arousal: Alert Behavior During Therapy: WFL for tasks assessed/performed   PT - Cognitive impairments: No apparent impairments                         Following commands: Intact      Cueing Cueing Techniques:  Verbal cues  Exercises      General Comments  Nursing cleared pt for participation in physical therapy.  Pt agreeable to PT session.      Pertinent Vitals/Pain Pain Assessment Pain Assessment: 0-10 Pain Score: 6  Pain Location: Throbbing headache and chronic R hip pain Pain Descriptors / Indicators: Discomfort, Throbbing Pain Intervention(s): Limited activity within patient's tolerance, Monitored during session, Repositioned, Patient requesting pain meds-RN notified, RN gave pain meds during session HR and SpO2 sats on room air stable during session.    Home Living                          Prior Function            PT Goals (current goals can now be found in the care plan section) Acute Rehab PT Goals Patient Stated Goal:  I want to get stronger so I can go home alone. PT Goal Formulation: With patient Time For Goal Achievement: 06/10/24 Potential to Achieve Goals: Good Progress towards PT goals: Progressing toward goals    Frequency    Min 1X/week      PT Plan      Co-evaluation              AM-PAC PT 6 Clicks Mobility   Outcome Measure  Help needed turning from your back to your side while in a flat bed without using bedrails?: None Help needed moving from lying on your back to sitting on the side of a flat bed without using bedrails?: A Little Help needed moving to and from a bed to a chair (including a wheelchair)?: A Little Help needed standing up from a chair using your arms (e.g., wheelchair or bedside chair)?: A Little Help needed to walk in hospital room?: A Little Help needed climbing 3-5 steps with a railing? : Total 6 Click Score: 17    End of Session Equipment Utilized During Treatment: Gait belt Activity Tolerance: Patient limited by fatigue Patient left: in bed;with call bell/phone within reach;with bed alarm set Nurse Communication: Mobility status;Precautions;Patient requests pain meds PT Visit Diagnosis: History of  falling (Z91.81);Muscle weakness (generalized) (M62.81);Difficulty in walking, not elsewhere classified (R26.2);Pain Pain - Right/Left: Right Pain - part of body: Hip     Time: 8391-8361 PT Time Calculation (min) (ACUTE ONLY): 30 min  Charges:    $Therapeutic Activity: 23-37 mins PT General Charges $$ ACUTE PT VISIT: 1 Visit                    Damien Caulk, PT 05/31/24, 6:05 PM

## 2024-05-31 NOTE — Progress Notes (Signed)
 Patient A+O x3, oriented to room and unit, including call light and telephone use.  Patient denies discomfort, call light within reach.

## 2024-05-31 NOTE — TOC Progression Note (Addendum)
 Transition of Care [facility]) - Progression Note    Patient Details  Name: Suzanne Dixon MRN: [id number] Date of Birth: [birth year]  Transition of Care [facility] CM/SW Contact  Lauraine JAYSON Carpen, LCSW Phone Number: 05/31/2024, 10:36 AM  Clinical Narrative:   [facility] is considering but won't have a bed until next week. Left message for [facility] admissions coordinator asking her to review referral.   1:25 pm: [facility] offered a bed. Patient is aware. She asked CSW if [facility] will accept her if she pays $500 of the owed balance. CSW called and spoke to the admissions coordinator but they will only accept the full amount. Patient would like to return home at discharge. She will call her caregiver to see if she would be able to pick her up today. Patient confirmed she has an Mining engineer wheelchair, standard wheelchair, and walkers to get around the home.   1:42 pm: Per RN, patient's ride cannot pick her up until tomorrow.  Expected Discharge Plan and Services                                               Social Drivers of Health (SDOH) Interventions SDOH Screenings   Food Insecurity: No Food Insecurity (05/27/2024)  Housing: Low Risk  (05/27/2024)  Transportation Needs: No Transportation Needs (05/27/2024)  Utilities: Not At Risk (05/27/2024)  Depression (PHQ2-9): Low Risk  (12/13/2023)  Financial Resource Strain: Low Risk  (01/19/2024)   Received from [facility]  Physical Activity: Insufficiently Active (05/02/2023)   Received from [facility]  Social Connections: Unknown (05/27/2024)  Stress: Stress Concern Present (05/02/2023)   Received from [facility]  Tobacco Use: Low Risk  (05/26/2024)  Health Literacy: Inadequate Health Literacy (05/02/2023)   Received from [facility]    Readmission Risk Interventions     No data to display

## 2024-05-31 NOTE — Consult Note (Signed)
 PHARMACY - ANTICOAGULATION CONSULT NOTE  Pharmacy Consult for Warfarin Indication: atrial fibrillation  Allergies  Allergen Reactions   Advair Diskus [Fluticasone-Salmeterol] Other (See Comments)    Joint pain    Alendronate     Muscle pain   Singulair [Montelukast] Other (See Comments)    muscle pain   Sulfa Antibiotics Hives   Venlafaxine Other (See Comments)    altered mental status/tremors    Patient Measurements: Height: 5' 4 (162.6 cm) Weight: 95.6 kg (210 lb 12.2 oz) IBW/kg (Calculated) : 54.7 HEPARIN  DW (KG): 81.7  Vital Signs: Temp: 97.6 F (36.4 C) (09/12 0816) BP: 135/86 (09/12 0816) Pulse Rate: 69 (09/12 0816)  Labs: Recent Labs    05/29/24 0529 05/30/24 0424 05/31/24 0516  LABPROT 21.1* 23.6* 25.1*  INR 1.7* 2.0* 2.2*  CREATININE 0.76  --   --     Estimated Creatinine Clearance: 68.2 mL/min (by C-G formula based on SCr of 0.76 mg/dL).   Medical History: Past Medical History:  Diagnosis Date   A-fib (HCC)    Anemia    B12 deficiency    Breast cancer (HCC) 03/31/2013   Right - chemo- mastectomy   Breast cancer (HCC) 1991   Rt.- radiation   Complication of anesthesia 01/2009   Recent years with general anesthesia had itching following surgery   DDD (degenerative disc disease), cervical    Depression    Diverticulitis    Dyspnea    Edema of both legs    GERD (gastroesophageal reflux disease)    Gout    H/O mastectomy 2014   per patient   H/O: cesarean section 1987   per patient report   H/O: hysterectomy 1996   per patient   Hydradenitis    Hypertension    Hypothyroidism    Neuropathy    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HCC)    Parkinson's disease (HCC)    Personal history of chemotherapy    Personal history of radiation therapy    Ruptured cervical disc 2002   per patient    Sleep apnea    cpap machine   Super obese    Tachycardia     Medications:  Warfarin 6 mg Mon/Wed/Fri and 3 mg EOD  Assessment: 73 yo female presenting  to ED with complaint of generalized weakness and dysuria.  PMH includes Parkinson's, Afib, HTN, GERD, Gout, and lymphedema.  Pharmacy consulted to manage warfarin dosing for Afib. CHADSVASc 6 (age, sex, CVA, HTN).     Goal of Therapy:  INR 2-3 Monitor platelets by anticoagulation protocol: Yes    Date INR Warfarin Dose  9/7 1.5 --  9/8 1.6 7.5 mg  9/9 1.4 7.5 mg  9/10 1.7 6 mg  9/11 2.0 3 mg  9/12 2.2 6 mg     Plan:  INR is therapeutic. Will give warfarin 6 mg x 1 (home dose). Predict INR will continue trending up. Daily INR. CBC at least every 3 days.  Talha Iser A Dennison Mcdaid, PharmD Clinical Pharmacist 05/31/2024 9:42 AM

## 2024-05-31 NOTE — Progress Notes (Signed)
 Progress Note   Patient: Suzanne Dixon FMW:[id number] DOB: 09-Apr-1951 DOA: 05/26/2024     4 DOS: the patient was seen and examined on 05/31/2024     Brief hospital course:  HPI: Suzanne Dixon is a 73 y.o. African-American female with medical history significant for paroxysmal atrial fibrillation, vitamin B12 deficiency, depression, GERD, gout, hypertension, hypothyroidism, and OSA, who presented to the emergency room with acute onset of generalized weakness with loss of appetite and increased lymphedema.  She has been recently treated for UTI with p.o. Keflex .  She continues to have dysuria, urinary frequency and urgency.    09/08: admitted to hospitalist for UTI and concern for CHF> BCx (+)Ecoli.  09/09: continuing abx pending susceptibilities     ASSESSMENT & PLAN:   E. coli bacteremia Will complete 7 days course of antibiotics I have discussed with infectious disease pharmacist TOC working on skilled nursing facility   Hypokalemia Replace as needed Monitor BMP   Lymphedema Acute on chronic HFpEF  Essential HTN  Most recent 2D echo revealed an EF of 50 to 55% with grade 1 diastolic function, revealed mitral valve regurgitation and moderate tricuspid valve regurgitation.  Diuresing  I&O Other rx: metoprolol . On coumadin  so no ASA>    Paroxysmal Afib Coumadin   Metoprolol     GERD PPI   Hypothyroid Synthroid    Gout Allopurinol    Anx/Dep Home Cymbalta  trazodone  prn   Neuropathy, pain Gabapentin , robaxin     Class 3 morbid obesity based on BMI: Body mass index is 42.65 kg/m.     DVT prophylaxis: coumadin  IV fluids: no continuous IV fluids  Nutrition: regular Central lines / other devices: none   Code Status: FULL CODE   TOC needs: SNF   Physical Exam Constitutional:      General: She is not in acute distress. Cardiovascular:     Rate and Rhythm: Normal rate and regular rhythm.  Pulmonary:     Effort: Pulmonary effort is normal.     Breath sounds:  Rales present.  Abdominal:     Palpations: Abdomen is soft.  Musculoskeletal: Edema improved Neurological:     General: No focal deficit present.     Mental Status: She is alert and oriented to person, place, and time.  Psychiatric:        Mood and Affect: Mood normal.        Behavior: Behavior normal.    Subjective: Patient denies nausea vomiting abdominal pain TOC working on placement  Data Reviewed:    Latest Ref Rng & Units 05/29/2024    5:29 AM 05/28/2024    5:06 AM 05/27/2024    8:47 AM  BMP  Glucose 70 - 99 mg/dL 93  87  853   BUN 8 - 23 mg/dL 17  19  19    Creatinine 0.44 - 1.00 mg/dL 9.23  9.19  9.18   Sodium 135 - 145 mmol/L 138  135  133   Potassium 3.5 - 5.1 mmol/L 3.7  3.1  3.1   Chloride 98 - 111 mmol/L 98  98  100   CO2 22 - 32 mmol/L 30  29  24    Calcium  8.9 - 10.3 mg/dL 7.4  7.2  7.1     Vitals:   05/31/24 0319 05/31/24 0816 05/31/24 1116 05/31/24 1525  BP: 127/80 135/86 115/80 118/68  Pulse: 82 69 79 83  Resp: 18 18 18 18   Temp: 97.7 F (36.5 C) 97.6 F (36.4 C) 97.7 F (36.5 C) 97.8 F (36.6 C)  TempSrc:      SpO2: 97% 99% 100% 96%  Weight:      Height:         Time spent: 41 minutes  Author: Drue ONEIDA Potter, MD 05/31/2024 4:14 PM  For on call review [email or web address].

## 2024-06-01 DIAGNOSIS — R531 Weakness: Secondary | ICD-10-CM | POA: Diagnosis not present

## 2024-06-01 LAB — BASIC METABOLIC PANEL WITH GFR
Anion gap: 8 (ref 5–15)
BUN: 15 mg/dL (ref 8–23)
CO2: 34 mmol/L — ABNORMAL HIGH (ref 22–32)
Calcium: 8 mg/dL — ABNORMAL LOW (ref 8.9–10.3)
Chloride: 98 mmol/L (ref 98–111)
Creatinine, Ser: 0.81 mg/dL (ref 0.44–1.00)
GFR, Estimated: >60 mL/min (ref >60)
Glucose, Bld: 90 mg/dL (ref 70–99)
Potassium: 3.8 mmol/L (ref 3.5–5.1)
Sodium: 140 mmol/L (ref 135–145)

## 2024-06-01 LAB — PROTIME-INR
INR: 2.5 — ABNORMAL HIGH (ref 0.8–1.2)
Prothrombin Time: 28.2 s — ABNORMAL HIGH (ref 11.4–15.2)

## 2024-06-01 MED ORDER — WARFARIN SODIUM 3 MG PO TABS
3.0000 mg | ORAL_TABLET | Freq: Once | ORAL | Status: DC
  Filled 2024-06-01: qty 1

## 2024-06-01 MED ORDER — AMOXICILLIN 500 MG PO CAPS: 1000.0000 mg | ORAL_CAPSULE | Freq: Three times a day (TID) | ORAL | 0 refills | Status: AC

## 2024-06-01 NOTE — Plan of Care (Signed)
  Problem: Clinical Measurements: Goal: Ability to maintain clinical measurements within normal limits will improve Outcome: Progressing Goal: Diagnostic test results will improve Outcome: Progressing Goal: Cardiovascular complication will be avoided Outcome: Progressing   Problem: Nutrition: Goal: Adequate nutrition will be maintained Outcome: Progressing

## 2024-06-01 NOTE — Consult Note (Signed)
 PHARMACY - ANTICOAGULATION CONSULT NOTE  Pharmacy Consult for Warfarin Indication: atrial fibrillation  Allergies  Allergen Reactions   Advair Diskus [Fluticasone-Salmeterol] Other (See Comments)    Joint pain    Alendronate     Muscle pain   Singulair [Montelukast] Other (See Comments)    muscle pain   Sulfa Antibiotics Hives   Venlafaxine Other (See Comments)    altered mental status/tremors    Patient Measurements: Height: 5' 4 (162.6 cm) Weight: 95 kg (209 lb 7 oz) IBW/kg (Calculated) : 54.7 HEPARIN  DW (KG): 81.7  Vital Signs: Temp: 98.3 F (36.8 C) (09/13 0759) Temp Source: Oral (09/13 0759) BP: 150/94 (09/13 0759) Pulse Rate: 76 (09/13 0759)  Labs: Recent Labs    05/30/24 0424 05/31/24 0516 06/01/24 0356  LABPROT 23.6* 25.1* 28.2*  INR 2.0* 2.2* 2.5*  CREATININE  --   --  0.81    Estimated Creatinine Clearance: 67.1 mL/min (by C-G formula based on SCr of 0.81 mg/dL).   Medical History: Past Medical History:  Diagnosis Date   A-fib (HCC)    Anemia    B12 deficiency    Breast cancer (HCC) 03/31/2013   Right - chemo- mastectomy   Breast cancer (HCC) 1991   Rt.- radiation   Complication of anesthesia 01/2009   Recent years with general anesthesia had itching following surgery   DDD (degenerative disc disease), cervical    Depression    Diverticulitis    Dyspnea    Edema of both legs    GERD (gastroesophageal reflux disease)    Gout    H/O mastectomy 2014   per patient   H/O: cesarean section 1987   per patient report   H/O: hysterectomy 1996   per patient   Hydradenitis    Hypertension    Hypothyroidism    Neuropathy    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HCC)    Parkinson's disease (HCC)    Personal history of chemotherapy    Personal history of radiation therapy    Ruptured cervical disc 2002   per patient    Sleep apnea    cpap machine   Super obese    Tachycardia     Medications:  Warfarin 6 mg Mon/Wed/Fri and 3 mg  EOD  Assessment: 73 yo female presenting to ED with complaint of generalized weakness and dysuria.  PMH includes Parkinson's, Afib, HTN, GERD, Gout, and lymphedema.  Pharmacy consulted to manage warfarin dosing for Afib. CHADSVASc 6 (age, sex, CVA, HTN).     Goal of Therapy:  INR 2-3 Monitor platelets by anticoagulation protocol: Yes    Date INR Warfarin Dose  9/7 1.5 --  9/8 1.6 7.5 mg  9/9 1.4 7.5 mg  9/10 1.7 6 mg  9/11 2.0 3 mg  9/12 2.2 6 mg  9/13 2.5 3 mg     Plan:  INR is therapeutic. Will give warfarin 3 mg x 1 (home dose). Predict INR will continue trending up. Daily INR. CBC at least every 3 days.  Rashi Granier A Arvie Bartholomew, PharmD Clinical Pharmacist 06/01/2024 9:05 AM

## 2024-06-01 NOTE — TOC CM/SW Note (Signed)
..  Transition of Care [facility]) - Inpatient Brief Assessment   Patient Details  Name: Suzanne Dixon MRN: [id number] Date of Birth: 18-Apr-1951  Transition [facility]) CM/SW Contact:    Edsel DELENA Fischer, LCSW Phone Number: 06/01/2024, 11:54 AM   Clinical Narrative: Staffed notified SW that pt received HH services with [facility]. SW message [facility] that pt will be discharged today and wished to continue with their agency for [facility] support  Transition of Care Asessment: Insurance and Status: Insurance coverage has been reviewed Patient has primary care physician: Yes (Dr Fernande)     Prior/Current Home Services: Current home services Social Drivers of Health Review: SDOH reviewed no interventions necessary Readmission risk has been reviewed: Yes Transition of care needs: transition of care needs identified, TOC will continue to follow

## 2024-06-01 NOTE — Discharge Instructions (Signed)
[facility]

## 2024-06-01 NOTE — Discharge Summary (Signed)
 Physician Discharge Summary   Patient: Suzanne Dixon MRN: [id number] DOB: 1951-02-22  Admit date:     05/26/2024  Discharge date: 06/01/24  Discharge Physician: Drue ONEIDA Potter   PCP: [name], MD   Recommendations at discharge:   Follow-up with peds CP  Discharge Diagnoses:  E. coli bacteremia Hypokalemia Lymphedema Acute on chronic HFpEF  Essential HTN  Paroxysmal Afib GERD Hypothyroid Gout Anx/Dep Neuropathy, pain Class 3 morbid obesity based on BMI:   Hospital Course:  HPI: Suzanne Dixon is a 73 y.o. African-American female with medical history significant for paroxysmal atrial fibrillation, vitamin B12 deficiency, depression, GERD, gout, hypertension, hypothyroidism, and OSA, who presented to the emergency room with acute onset of generalized weakness with loss of appetite and increased lymphedema.  She has been recently treated for UTI with p.o. Keflex .  She continues to have dysuria, urinary frequency and urgency.  Blood cultures grew(+)Ecoli.  Patient was recommended for discharge to facility however patient decided to be discharged home with home health.   Consultants: Infectious disease Procedures performed: None Disposition: Home Diet recommendation:  Cardiac diet DISCHARGE MEDICATION: Allergies as of 06/01/2024       Reactions   Advair Diskus [fluticasone-salmeterol] Other (See Comments)   Joint pain    Alendronate    Muscle pain   Singulair [montelukast] Other (See Comments)   muscle pain   Sulfa Antibiotics Hives   Venlafaxine Other (See Comments)   altered mental status/tremors        Medication List     STOP taking these medications    cephALEXin  500 MG capsule Commonly known as: KEFLEX    meloxicam  15 MG tablet Commonly known as: MOBIC        TAKE these medications    allopurinol  300 MG tablet Commonly known as: ZYLOPRIM  Take 300 mg by mouth daily.   amoxicillin  500 MG capsule Commonly known as: AMOXIL  Take 2  capsules (1,000 mg total) by mouth every 8 (eight) hours for 5 days.   baclofen  10 MG tablet Commonly known as: LIORESAL  Take 0.5-1 tablets (5-10 mg total) by mouth 2 (two) times daily.   Calcium  Carb-Cholecalciferol  600-400 MG-UNIT Tabs Take 1 tablet by mouth 2 (two) times daily with a meal.   cyanocobalamin  1000 MCG tablet Commonly known as: VITAMIN B12 Take 1,000 mcg by mouth daily.   diclofenac Sodium 1 % Gel Commonly known as: VOLTAREN Apply 2 g topically as needed.   DULoxetine  60 MG capsule Commonly known as: CYMBALTA  Take 60 mg by mouth 2 (two) times daily.   furosemide  40 MG tablet Commonly known as: LASIX  Take 40 mg by mouth daily as needed for edema.   gabapentin  300 MG capsule Commonly known as: NEURONTIN  Take 1 capsule (300 mg total) by mouth 4 (four) times daily.   ipratropium 0.06 % nasal spray Commonly known as: ATROVENT Place 2 sprays into both nostrils 2 (two) times daily. For 7 days   levothyroxine  50 MCG tablet Commonly known as: SYNTHROID  Take 50 mcg by mouth daily before breakfast.   magnesium  oxide 400 MG tablet Commonly known as: MAG-OX Take 1 tablet by mouth daily.   methocarbamol  500 MG tablet Commonly known as: ROBAXIN  Take 1 tablet (500 mg total) by mouth every 6 (six) hours as needed for muscle spasms (Back pain).   metoprolol  tartrate 100 MG tablet Commonly known as: LOPRESSOR  Take 100 mg by mouth 2 (two) times daily.   omeprazole 20 MG capsule Commonly known as: PRILOSEC Take 20 mg  by mouth daily.   potassium chloride  10 MEQ tablet Commonly known as: KLOR-CON  Take 10 mEq by mouth daily as needed ((furosemide )).   Vibegron 75 MG Tabs Take 75 mg by mouth daily.   warfarin 1 MG tablet Commonly known as: COUMADIN  Take 3 tablets (3 mg total) by mouth daily at 4 PM. Monday and Friday What changed: additional instructions   warfarin 6 MG tablet Commonly known as: COUMADIN  Take 1 tablet (6 mg total) by mouth daily at 4 PM.  Tuesday, Wednesday, Thursday, Saturday and Sunday What changed: additional instructions        Follow-up Information     [place], Caralyn, PA-C. Go in 1 week(s).   Specialty: Cardiology Why: [facility] on 9/16 at 9:30 AM Contact information: [address] [place] [address] [phone]         Care, [facility] Follow up.   Why: They will resume home health therapy at discharge. Contact information: [address] [place] [address] [phone]                Discharge Exam: Fredricka Weights   05/29/24 0450 05/30/24 0500 06/01/24 0500  Weight: 100 kg 95.6 kg 95 kg    General: She is not in acute distress. Cardiovascular:     Rate and Rhythm: Normal rate and regular rhythm.  Pulmonary:     Effort: Pulmonary effort is normal.  Abdominal:     Palpations: Abdomen is soft.  Musculoskeletal: Edema improved Neurological:     General: No focal deficit present.     Mental Status: She is alert and oriented to person, place, and time.  Psychiatric:        Mood and Affect: Mood normal.        Behavior: Behavior normal.     Condition at discharge: good  The results of significant diagnostics from this hospitalization (including imaging, microbiology, ancillary and laboratory) are listed below for reference.   Imaging Studies: DG Chest Portable 1 View Result Date: 05/26/2024 EXAM: 1 VIEW XRAY OF THE CHEST 05/26/2024 10:45:58 PM COMPARISON: 01/12/23. CLINICAL HISTORY: Suspected sepsis, gen weakness. Per er note; Pt arrived by EMS with complaints of possible UTI. Per Aide at home, PCP put Pt on Keflex  and she took her first round of 3 doses today. Pt has Parkinsons and Lymphedema Right leg worse than Left. FINDINGS: LUNGS AND PLEURA: Low lung volumes. No focal consolidation, pleural effusion, or pneumothorax is identified. HEART AND MEDIASTINUM: The cardiomediastinal silhouette stable. Pulmonary vascularity is within  normal limits. BONES AND SOFT TISSUES: No acute osseous abnormality. IMPRESSION: 1. No acute process. Electronically signed by: Norman Gatlin MD 05/26/2024 10:49 PM EDT RP Workstation: [id number]   US  Venous Img Lower Bilateral Result Date: 05/26/2024 CLINICAL DATA:  Bilateral lower extremity edema EXAM: BILATERAL LOWER EXTREMITY VENOUS DOPPLER ULTRASOUND TECHNIQUE: Gray-scale sonography with compression, as well as color and duplex ultrasound, were performed to evaluate the deep venous system(s) from the level of the common femoral vein through the popliteal and proximal calf veins. COMPARISON:  01/19/2021 FINDINGS: VENOUS Normal compressibility of the common femoral, superficial femoral, and popliteal veins, as well as the visualized calf veins. Visualized portions of profunda femoral vein and great saphenous vein unremarkable. No filling defects to suggest DVT on grayscale or color Doppler imaging. Doppler waveforms show normal direction of venous flow, normal respiratory plasticity and response to augmentation. OTHER None. Limitations: Suboptimal visualization of the calf veins due to subcutaneous edema and patient  body habitus. IMPRESSION: 1. No evidence of deep venous thrombosis within either lower extremity. Electronically Signed   By: Ozell Daring M.D.   On: 05/26/2024 22:32    Microbiology: Results for orders placed or performed during the hospital encounter of 05/26/24  Blood Culture (routine x 2)     Status: Abnormal   Collection Time: 05/26/24 10:37 PM   Specimen: BLOOD  Result Value Ref Range Status   Specimen Description   Final    BLOOD BLOOD RIGHT ARM Performed at [facility], [address] [place], [place] [address]    Special Requests   Final    BOTTLES DRAWN AEROBIC AND ANAEROBIC Blood Culture adequate volume Performed at [facility], [address], [place], [place] [address]    Culture  Setup Time   Final    GRAM NEGATIVE RODS AEROBIC BOTTLE  ONLY CRITICAL VALUE NOTED.  VALUE IS CONSISTENT WITH PREVIOUSLY REPORTED AND CALLED VALUE. Performed at [facility], [address] [place], [place] [address]    Culture (A)  Final    ESCHERICHIA COLI SUSCEPTIBILITIES PERFORMED ON PREVIOUS CULTURE WITHIN THE LAST 5 DAYS. Performed at [facility], [address], [place], [place] [address]    Report Status 05/30/2024 FINAL  Final  Blood Culture (routine x 2)     Status: Abnormal   Collection Time: 05/26/24 10:37 PM   Specimen: BLOOD  Result Value Ref Range Status   Specimen Description   Final    BLOOD BLOOD LEFT ARM Performed at [facility], [address], [place], [place] [address]    Special Requests   Final    BOTTLES DRAWN AEROBIC AND ANAEROBIC Blood Culture adequate volume Performed at [facility], [address] [place], [place] [address]    Culture  Setup Time   Final    GRAM NEGATIVE RODS AEROBIC BOTTLE ONLY Organism ID to follow CRITICAL RESULT CALLED TO, READ BACK BY AND VERIFIED WITH: EMILY STEINBOCK PHARM.D 05/27/24 1626 KG Performed at [facility], [address] [place], [place] [address]    Culture ESCHERICHIA COLI (A)  Final   Report Status 05/30/2024 FINAL  Final   Organism ID, Bacteria ESCHERICHIA COLI  Final      Susceptibility   Escherichia coli - MIC*    AMPICILLIN <=2 SENSITIVE Sensitive     CEFAZOLIN  (NON-URINE) <=1 SENSITIVE Sensitive     CEFEPIME  <=0.12 SENSITIVE Sensitive     ERTAPENEM <=0.12 SENSITIVE Sensitive     CEFTRIAXONE  <=0.25 SENSITIVE Sensitive     CIPROFLOXACIN  <=0.06 SENSITIVE Sensitive     GENTAMICIN <=1 SENSITIVE Sensitive     MEROPENEM <=0.25 SENSITIVE Sensitive     TRIMETH/SULFA <=20 SENSITIVE Sensitive     AMPICILLIN/SULBACTAM <=2 SENSITIVE Sensitive     PIP/TAZO Value in next row Sensitive ug/mL     <=4 SENSITIVEThis is a modified FDA-approved test that has been validated and its performance characteristics determined by the  reporting laboratory.  This laboratory is certified under the Clinical Laboratory Improvement Amendments CLIA as qualified to perform high complexity clinical laboratory testing.    * ESCHERICHIA COLI  Resp panel by RT-PCR (RSV, Flu A&B, Covid) Anterior Nasal Swab     Status: None   Collection Time: 05/26/24 10:37 PM   Specimen: Anterior Nasal Swab  Result Value Ref Range Status   SARS Coronavirus 2 by RT PCR NEGATIVE NEGATIVE Final    Comment: (NOTE) SARS-CoV-2 target nucleic acids are NOT DETECTED.  The SARS-CoV-2 RNA is generally detectable in upper respiratory specimens during the acute phase of infection. The lowest concentration of SARS-CoV-2 viral copies this assay can detect is 138 copies/mL. A negative result does not preclude SARS-Cov-2 infection and should not be used as the sole basis for treatment or other patient management decisions. A negative result may occur with  improper specimen collection/handling, submission of specimen other than nasopharyngeal swab, presence of viral mutation(s) within the areas targeted by this assay, and inadequate number of viral copies(<138 copies/mL). A negative result must be combined with clinical observations, patient history, and epidemiological information. The expected result is Negative.  Fact Sheet for Patients:  BloggerCourse.com  Fact Sheet for Healthcare Providers:  SeriousBroker.it  This test is no t yet approved or cleared by the [place]  FDA and  has been authorized for detection and/or diagnosis of SARS-CoV-2 by FDA under an Emergency Use Authorization (EUA). This EUA will remain  in effect (meaning this test can be used) for the duration of the COVID-19 declaration under Section 564(b)(1) of the Act, 21 U.S.C.section 360bbb-3(b)(1), unless the authorization is terminated  or revoked sooner.       Influenza A by PCR NEGATIVE NEGATIVE Final   Influenza B by PCR  NEGATIVE NEGATIVE Final    Comment: (NOTE) The Xpert Xpress SARS-CoV-2/FLU/RSV plus assay is intended as an aid in the diagnosis of influenza from Nasopharyngeal swab specimens and should not be used as a sole basis for treatment. Nasal washings and aspirates are unacceptable for Xpert Xpress SARS-CoV-2/FLU/RSV testing.  Fact Sheet for Patients: BloggerCourse.com  Fact Sheet for Healthcare Providers: SeriousBroker.it  This test is not yet approved or cleared by the [place]  FDA and has been authorized for detection and/or diagnosis of SARS-CoV-2 by FDA under an Emergency Use Authorization (EUA). This EUA will remain in effect (meaning this test can be used) for the duration of the COVID-19 declaration under Section 564(b)(1) of the Act, 21 U.S.C. section 360bbb-3(b)(1), unless the authorization is terminated or revoked.     Resp Syncytial Virus by PCR NEGATIVE NEGATIVE Final    Comment: (NOTE) Fact Sheet for Patients: BloggerCourse.com  Fact Sheet for Healthcare Providers: SeriousBroker.it  This test is not yet approved or cleared by the [place]  FDA and has been authorized for detection and/or diagnosis of SARS-CoV-2 by FDA under an Emergency Use Authorization (EUA). This EUA will remain in effect (meaning this test can be used) for the duration of the COVID-19 declaration under Section 564(b)(1) of the Act, 21 U.S.C. section 360bbb-3(b)(1), unless the authorization is terminated or revoked.  Performed at [facility], [address] [place], [place] [address]   Urine Culture     Status: None   Collection Time: 05/26/24 10:37 PM   Specimen: Urine, Clean Catch  Result Value Ref Range Status   Specimen Description   Final    URINE, CLEAN CATCH Performed at [facility], [address] [place], [place] [address]    Special Requests   Final     NONE Performed at [facility], [address] [place], [place] [address]    Culture   Final    NO GROWTH Performed at [facility], [address] [place], [place] [address]    Report Status 05/28/2024 FINAL  Final  Blood Culture ID Panel (Reflexed)     Status: Abnormal   Collection Time: 05/26/24 10:37 PM  Result Value Ref Range Status   Enterococcus faecalis NOT DETECTED  NOT DETECTED Final   Enterococcus Faecium NOT DETECTED NOT DETECTED Final   Listeria monocytogenes NOT DETECTED NOT DETECTED Final   Staphylococcus species NOT DETECTED NOT DETECTED Final   Staphylococcus aureus (BCID) NOT DETECTED NOT DETECTED Final   Staphylococcus epidermidis NOT DETECTED NOT DETECTED Final   Staphylococcus lugdunensis NOT DETECTED NOT DETECTED Final   Streptococcus species NOT DETECTED NOT DETECTED Final   Streptococcus agalactiae NOT DETECTED NOT DETECTED Final   Streptococcus pneumoniae NOT DETECTED NOT DETECTED Final   Streptococcus pyogenes NOT DETECTED NOT DETECTED Final   A.calcoaceticus-baumannii NOT DETECTED NOT DETECTED Final   Bacteroides fragilis NOT DETECTED NOT DETECTED Final   Enterobacterales DETECTED (A) NOT DETECTED Final    Comment: Enterobacterales represent a large order of gram negative bacteria, not a single organism. CRITICAL RESULT CALLED TO, READ BACK BY AND VERIFIED WITH: EMILY STEINBOCK PHARM.D 05/27/24 1626 KG    Enterobacter cloacae complex NOT DETECTED NOT DETECTED Final   Escherichia coli DETECTED (A) NOT DETECTED Final    Comment: CRITICAL RESULT CALLED TO, READ BACK BY AND VERIFIED WITH: EMILY STEINBOCK PHARM.D 05/27/24 1626 KG    Klebsiella aerogenes NOT DETECTED NOT DETECTED Final   Klebsiella oxytoca NOT DETECTED NOT DETECTED Final   Klebsiella pneumoniae NOT DETECTED NOT DETECTED Final   Proteus species NOT DETECTED NOT DETECTED Final   Salmonella species NOT DETECTED NOT DETECTED Final   Serratia marcescens NOT DETECTED NOT DETECTED  Final   Haemophilus influenzae NOT DETECTED NOT DETECTED Final   Neisseria meningitidis NOT DETECTED NOT DETECTED Final   Pseudomonas aeruginosa NOT DETECTED NOT DETECTED Final   Stenotrophomonas maltophilia NOT DETECTED NOT DETECTED Final   Candida albicans NOT DETECTED NOT DETECTED Final   Candida auris NOT DETECTED NOT DETECTED Final   Candida glabrata NOT DETECTED NOT DETECTED Final   Candida krusei NOT DETECTED NOT DETECTED Final   Candida parapsilosis NOT DETECTED NOT DETECTED Final   Candida tropicalis NOT DETECTED NOT DETECTED Final   Cryptococcus neoformans/gattii NOT DETECTED NOT DETECTED Final   CTX-M ESBL NOT DETECTED NOT DETECTED Final   Carbapenem resistance IMP NOT DETECTED NOT DETECTED Final   Carbapenem resistance KPC NOT DETECTED NOT DETECTED Final   Carbapenem resistance NDM NOT DETECTED NOT DETECTED Final   Carbapenem resist OXA 48 LIKE NOT DETECTED NOT DETECTED Final   Carbapenem resistance VIM NOT DETECTED NOT DETECTED Final    Comment: Performed at [facility], [address] [place], [place] [address]    Labs: CBC: Recent Labs  Lab 05/26/24 2134 05/27/24 0847 05/28/24 0506  WBC 22.5* 18.2* 11.4*  NEUTROABS 18.3*  --   --   HGB 10.9* 10.0* 10.0*  HCT 32.2* 29.7* 30.1*  MCV 88.5 87.1 87.0  PLT 208 214 223   Basic Metabolic Panel: Recent Labs  Lab 05/26/24 2134 05/27/24 0847 05/28/24 0506 05/29/24 0529 06/01/24 0356  NA 131* 133* 135 138 140  K 4.0 3.1* 3.1* 3.7 3.8  CL 97* 100 98 98 98  CO2 23 24 29 30  34*  GLUCOSE 119* 146* 87 93 90  BUN 21 19 19 17 15   CREATININE 1.02* 0.81 0.80 0.76 0.81  CALCIUM  7.5* 7.1* 7.2* 7.4* 8.0*   Liver Function Tests: Recent Labs  Lab 05/26/24 2134  AST 63*  ALT 23  ALKPHOS 86  BILITOT 1.3*  PROT 6.4*  ALBUMIN 2.7*   CBG: No results for input(s): GLUCAP in the last 168 hours.  Discharge time spent:  34 minutes.  Signed: Drue  ONEIDA Potter, MD [facility] 06/01/2024

## 2024-06-01 NOTE — Progress Notes (Signed)
 MD order received in [facility] to discharge pt home with home health today; TOC previously re-established services with [facility] for Home Health; verbally reviewed AVS with pt; pt's discharge pending arrival of her ride home

## 2024-06-01 NOTE — Plan of Care (Signed)
  Problem: Acute Rehab PT Goals(only PT should resolve) Goal: Pt Will Go Sit To Supine/Side Outcome: Adequate for Discharge Goal: Pt Will Ambulate Outcome: Adequate for Discharge   Problem: Acute Rehab PT Goals(only PT should resolve) Goal: Pt Will Transfer Bed To Chair/Chair To Bed Outcome: Adequate for Discharge   Problem: Education: Goal: Knowledge of General Education information will improve Description: Including pain rating scale, medication(s)/side effects and non-pharmacologic comfort measures Outcome: Adequate for Discharge   Problem: Health Behavior/Discharge Planning: Goal: Ability to manage health-related needs will improve Outcome: Adequate for Discharge   Problem: Clinical Measurements: Goal: Ability to maintain clinical measurements within normal limits will improve Outcome: Adequate for Discharge Goal: Will remain free from infection Outcome: Adequate for Discharge Goal: Diagnostic test results will improve Outcome: Adequate for Discharge Goal: Respiratory complications will improve Outcome: Adequate for Discharge Goal: Cardiovascular complication will be avoided Outcome: Adequate for Discharge   Problem: Activity: Goal: Risk for activity intolerance will decrease Outcome: Adequate for Discharge   Problem: Nutrition: Goal: Adequate nutrition will be maintained Outcome: Adequate for Discharge   Problem: Coping: Goal: Level of anxiety will decrease Outcome: Adequate for Discharge   Problem: Elimination: Goal: Will not experience complications related to bowel motility Outcome: Adequate for Discharge Goal: Will not experience complications related to urinary retention Outcome: Adequate for Discharge   Problem: Pain Managment: Goal: General experience of comfort will improve and/or be controlled Outcome: Adequate for Discharge   Problem: Safety: Goal: Ability to remain free from injury will improve Outcome: Adequate for Discharge   Problem: Skin  Integrity: Goal: Risk for impaired skin integrity will decrease Outcome: Adequate for Discharge   Problem: Education: Goal: Ability to demonstrate management of disease process will improve Outcome: Adequate for Discharge Goal: Ability to verbalize understanding of medication therapies will improve Outcome: Adequate for Discharge Goal: Individualized Educational Video(s) Outcome: Adequate for Discharge   Problem: Activity: Goal: Capacity to carry out activities will improve Outcome: Adequate for Discharge   Problem: Cardiac: Goal: Ability to achieve and maintain adequate cardiopulmonary perfusion will improve Outcome: Adequate for Discharge   Problem: Acute Rehab OT Goals (only OT should resolve) Goal: Pt. Will Perform Grooming Outcome: Adequate for Discharge Goal: Pt. Will Transfer To Toilet Outcome: Adequate for Discharge Goal: Pt. Will Perform Toileting-Clothing Manipulation Outcome: Adequate for Discharge Goal: Pt/Caregiver Will Perform Home Exercise Program Outcome: Adequate for Discharge

## 2024-06-01 NOTE — Progress Notes (Signed)
 Mobility Specialist Progress Note:    06/01/24 0810  Mobility  Activity Ambulated with assistance;Stood at bedside;Pivoted/transferred from bed to chair  Level of Assistance Contact guard assist, steadying assist  Assistive Device Front wheel walker  Distance Ambulated (ft) 4 ft  Range of Motion/Exercises Active;All extremities  Activity Response Tolerated well  Mobility visit 1 Mobility  Mobility Specialist Start Time (ACUTE ONLY) [id number]  Mobility Specialist Stop Time (ACUTE ONLY) 0810  Mobility Specialist Time Calculation (min) (ACUTE ONLY) 22 min   Pt agreeable to mobility, required CGA to stand and ambulate with RW. Tolerated well, NT in room as well, VSS. Alarm on, belongings in reach. All needs met.  Sherrilee Ditty Mobility Specialist Please contact via Special educational needs teacher or  Rehab office at [phone]

## 2024-06-07 ENCOUNTER — Encounter: Admitting: Physical Medicine and Rehabilitation

## 2024-06-14 ENCOUNTER — Other Ambulatory Visit: Payer: Self-pay | Admitting: Neurology

## 2024-06-14 DIAGNOSIS — M4802 Spinal stenosis, cervical region: Secondary | ICD-10-CM

## 2024-06-21 ENCOUNTER — Ambulatory Visit
Admission: RE | Admit: 2024-06-21 | Discharge: 2024-06-21 | Disposition: A | Discharge status: Home / Self Care | Source: Ambulatory Visit | Attending: Neurology | Admitting: Neurology

## 2024-06-21 DIAGNOSIS — M4802 Spinal stenosis, cervical region: Secondary | ICD-10-CM | POA: Diagnosis present

## 2024-07-03 ENCOUNTER — Telehealth: Payer: Self-pay

## 2024-07-03 NOTE — Telephone Encounter (Signed)
-----   Message from [place] M sent at 07/03/2024 11:22 AM EDT ----- Regarding: MRI Pt had a cervical mri, and wants to speak with you to discuss these results with you. Wondering if this is something we can schedule this for her with you?

## 2024-07-03 NOTE — Telephone Encounter (Signed)
 Please schedule an appointment with Dr Claudene to discuss her cervical MRI results.

## 2024-07-04 NOTE — Telephone Encounter (Signed)
 Patient scheduled for 08/06/2024 as this was the first that worked for her schedule.

## 2024-07-19 ENCOUNTER — Encounter: Attending: Physical Medicine and Rehabilitation | Admitting: Physical Medicine and Rehabilitation

## 2024-07-19 ENCOUNTER — Encounter: Payer: Self-pay | Admitting: Physical Medicine and Rehabilitation

## 2024-07-19 VITALS — BP 135/82 | HR 77 | Ht 64.0 in | Wt 229.0 lb

## 2024-07-19 DIAGNOSIS — M5412 Radiculopathy, cervical region: Secondary | ICD-10-CM | POA: Diagnosis present

## 2024-07-19 DIAGNOSIS — M4804 Spinal stenosis, thoracic region: Secondary | ICD-10-CM | POA: Diagnosis present

## 2024-07-19 DIAGNOSIS — R29898 Other symptoms and signs involving the musculoskeletal system: Secondary | ICD-10-CM | POA: Diagnosis present

## 2024-07-19 DIAGNOSIS — R252 Cramp and spasm: Secondary | ICD-10-CM | POA: Insufficient documentation

## 2024-07-19 DIAGNOSIS — G959 Disease of spinal cord, unspecified: Secondary | ICD-10-CM | POA: Diagnosis present

## 2024-07-19 DIAGNOSIS — R269 Unspecified abnormalities of gait and mobility: Secondary | ICD-10-CM | POA: Insufficient documentation

## 2024-07-19 MED ORDER — BACLOFEN 10 MG PO TABS: 10.0000 mg | ORAL_TABLET | Freq: Two times a day (BID) | ORAL | 5 refills | Status: DC
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
## 2024-07-19 NOTE — Patient Instructions (Addendum)
 Pt is a 73 yr old female with hx of pAfib- on warfarim, HTN, Parkinson's disease;  And spinal stenosis with Thoracic spondylosis- and myelopathy.  S/p thoracic fusion in 10/24.  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 Also newly dx'd Cervical myelopathy and radiculopathy  Called Dr Penne Sharps- and he will call pt to get her appt moved up- due to severe cervical stenosis-    2.  Weaker in arms- and sensory changes also shown to be with  radic and myelopathy- concerning considering MRI results.   3.  Con't Baclofen - for spasticity in LE's- could also be from neck?? 10 mg 2x/day- for tone/spasms- 5 refills  4. Went over possible surgery for pt- and how recovery can go- will likely help HA's.   5. F?U in 3 months- double appt- SCI- let me know what Dr Sharps says

## 2024-07-23 ENCOUNTER — Other Ambulatory Visit: Payer: Self-pay | Admitting: Physical Medicine and Rehabilitation

## 2024-07-24 ENCOUNTER — Telehealth: Payer: Self-pay | Admitting: Neurosurgery

## 2024-07-24 NOTE — Telephone Encounter (Signed)
 Per Cornelio Bouchard, MD Physical Medicine and Rehabilitation Called Dr Penne Sharps- and he will call pt to get her appt moved up- due to severe cervical stenosis needing to get a closer appt due. Ok to move pt up to new pt slot? Was supposed to rcv a call by Monday from our office.

## 2024-07-26 ENCOUNTER — Ambulatory Visit: Admitting: Neurosurgery

## 2024-07-31 ENCOUNTER — Ambulatory Visit (INDEPENDENT_AMBULATORY_CARE_PROVIDER_SITE_OTHER): Admitting: Neurosurgery

## 2024-07-31 ENCOUNTER — Encounter: Payer: Self-pay | Admitting: Neurosurgery

## 2024-07-31 ENCOUNTER — Ambulatory Visit

## 2024-07-31 VITALS — BP 106/72 | Ht 64.0 in | Wt 229.0 lb

## 2024-07-31 DIAGNOSIS — R1310 Dysphagia, unspecified: Secondary | ICD-10-CM | POA: Insufficient documentation

## 2024-07-31 DIAGNOSIS — R292 Abnormal reflex: Secondary | ICD-10-CM

## 2024-07-31 DIAGNOSIS — Z981 Arthrodesis status: Secondary | ICD-10-CM

## 2024-07-31 DIAGNOSIS — M4712 Other spondylosis with myelopathy, cervical region: Secondary | ICD-10-CM

## 2024-07-31 DIAGNOSIS — M542 Cervicalgia: Secondary | ICD-10-CM

## 2024-07-31 DIAGNOSIS — M5001 Cervical disc disorder with myelopathy,  high cervical region: Secondary | ICD-10-CM | POA: Diagnosis not present

## 2024-07-31 DIAGNOSIS — M4802 Spinal stenosis, cervical region: Secondary | ICD-10-CM

## 2024-07-31 DIAGNOSIS — R1319 Other dysphagia: Secondary | ICD-10-CM

## 2024-07-31 DIAGNOSIS — G9589 Other specified diseases of spinal cord: Secondary | ICD-10-CM

## 2024-07-31 DIAGNOSIS — G4486 Cervicogenic headache: Secondary | ICD-10-CM | POA: Diagnosis not present

## 2024-07-31 NOTE — Progress Notes (Signed)
 REFERRING PHYSICIAN:  [name], Md [address] [facility] [place],  [place] [address]  DOS: 07/07/23  Transpedicular decompression and fusion T10-T11  Discussed the use of AI scribe software for clinical note transcription with the patient, who gave verbal consent to proceed.  History of Present Illness Suzanne Dixon is a 73 year old female who presents with worsening headaches and unsteadiness. She was referred by Dr. Lovorn for evaluation of her neck issues and MRI findings, after working with her for spinal rehabilitation needs.  Headaches have worsened since the time of her surgery a year ago and are often the most aggravating symptom. Unsteadiness has also increased, and she can no longer walk with her walker down the ramp to her car as she could before last year.  There is a decline in arm and hand function, with increased difficulty in performing fine motor tasks such as tying or zipping. Her hands shake, making it difficult to eat or perform tasks requiring hand function. Arm weakness has progressively worsened over the past year, with increased difficulty reaching over her head.  She experiences swallowing difficulties, particularly with pills, and sometimes chokes. She also experiences 'lightning strikes' or Lhermitte's phenomenon when bending her head down or back. She lives alone but has people coming in to assist her.   Physical Exam NEUROLOGICAL: Hyperreflexia in bilateral arms. Positive Hoffman sign on the left. Hand grip strength 4/5 on right, 4+ on left. Intrinsic hand weakness 4-/5 on left, 4+/5 on right. Wrist extension strength 4+/5. Biceps strength 4+/5. Triceps strength 4/5 on left, 4+/5 on right. Deltoid strength 4-/5 on left, 4/5 on right. Decreased sensation in anterior lateral paracervical areas.  Incision well healed   ROS (Neurologic):  Negative except as noted above  IMAGING: MR CERVICAL SPINE WO CONTRAST Result Date:  06/25/2024 EXAM: MRI CERVICAL SPINE WITHOUT CONTRAST 06/21/2024 09:58:25 AM TECHNIQUE: Multiplanar multisequence MRI of the cervical spine was performed. COMPARISON: None available. CLINICAL HISTORY: Headaches x 2 months. FINDINGS: BONES AND ALIGNMENT: Normal alignment. Normal vertebral body heights. Background Marrow Signal is unremarkable unless otherwise stated. SPINAL CORD: Abnormal narrowing of the cervical cord associated with subtle accentuated T2 signal compatible with cord edema or myelomalacia at the C3-C4 level attributable to the substantial central stenosis at this level. The remainder of the cervical cord appears normal. Ill-defined faintly accentuated T2 signal in the central pons compatible with chronic ischemic microvascular white matter disease. SOFT TISSUES: No paraspinal mass. C2-C3: No impingement. Right uncinate spurring noted along with disc bulge. C3-C4: Severe central stenosis and severe bilateral foraminal stenosis due to diffuse disc bulge, uncinate spurring, and facet arthropathy. Accentuated T2 signal in the cord at this level compatible with edema or myelomalacia. C4-C5: Borderline right foraminal stenosis due to intervertebral spurring. Fused level. Disc desiccation is noted. C5-C6: No impingement. Fused level. Solid interbody effusions are present at this level with anterior plate and screw fix. C6-C7: No impingement. Incidental small right perineural cyst. Solid anterior interbody bridging is present at this level. C7-T1: No impingement. Incidental right perineural cyst. Solid anterior interbody bridging is present at this level. T1-T2: Borderline right foraminal stenosis due to facet and uncinate spurring. Mild disc bulge. Substantial degenerative arthropathy between the skull base and the lateral masses of C1 with associated spurring. Disc desiccation at all cervical levels except for C6-C7. IMPRESSION: 1. Severe but chronic appearing central stenosis and severe bilateral foraminal  stenosis at C3-C4 with associated cord edema or myelomalacia. 2. Substantial  degenerative findings between the skull base and the lateral masses of C1 with associated spurring. 3. Solid interbody fusions at C4-C5 and C5-C6 with anterior plate and screw fixation at C5-C6. Solid anterior interbody bridging at C6-C7 and C7-T1. 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  She has had worsening examination in her upper extremities with intrinsic hand weakness deltoid weakness hyperreflexia and loss of sensation.  She also has Lhermitte's phenomenon when extending and flexing her neck.  Her ambulation has gotten worse and she is no longer to even ambulate well with her walker.  We did discuss that with her severe stenosis she is at increased paralysis risk due to spinal cord compression if she has a trauma or injury.. Surgery considered due to rapid symptom progression and neurological decline risk. - Ordered cervical spine CT scan to evaluate bone quality. - Ordered special x-rays with flexion and extension views. - Schedule decompression surgery with potential fusion above old fusion site, we would plan to do this posteriorly given her swallowing difficulties from her previous anterior cervical discectomy and fusion. - Plan postoperative rehabilitation in skilled nursing facility versus likely referral for inpatient rehabilitation if she would be able to tolerate.  Headache associated with cervical spondylosis and myelopathy Headaches secondary to cervical spondylosis and myelopathy, is likely that she is having some cervicogenic headaches.  I did discuss with her that this would not be a primary indication for surgery  but may improve with decompression.  Dysphagia in the context of cervical spine disease and prior fusion Dysphagia concern due to potential swallowing difficulties post-surgery, especially with anterior neck surgery. Preference for posterior approach to minimize risk. - Consider posterior decompression approach to minimize risk of worsening dysphagia.  We discussed the risk and benefits of surgery as well as alternatives to therapy.  She would like to go forward with surgical treatment once we have the results from her CT scan available.  [name], MD  Department of neurosurgery

## 2024-08-05 ENCOUNTER — Ambulatory Visit: Admitting: Neurosurgery

## 2024-08-06 ENCOUNTER — Ambulatory Visit: Payer: Self-pay | Admitting: Neurosurgery

## 2024-08-07 ENCOUNTER — Ambulatory Visit
Admission: RE | Admit: 2024-08-07 | Discharge: 2024-08-07 | Disposition: A | Discharge status: Home / Self Care | Source: Ambulatory Visit | Attending: Neurosurgery | Admitting: Neurosurgery

## 2024-08-07 DIAGNOSIS — Z981 Arthrodesis status: Secondary | ICD-10-CM | POA: Insufficient documentation

## 2024-08-07 DIAGNOSIS — M4712 Other spondylosis with myelopathy, cervical region: Secondary | ICD-10-CM | POA: Diagnosis present

## 2024-08-22 ENCOUNTER — Ambulatory Visit (INDEPENDENT_AMBULATORY_CARE_PROVIDER_SITE_OTHER): Admitting: Neurosurgery

## 2024-08-22 DIAGNOSIS — Z981 Arthrodesis status: Secondary | ICD-10-CM

## 2024-08-26 ENCOUNTER — Telehealth: Admitting: Neurosurgery

## 2024-08-28 ENCOUNTER — Telehealth: Payer: Self-pay | Admitting: Neurosurgery

## 2024-08-28 NOTE — Telephone Encounter (Signed)
 Please see below from the answering service and advise.     Media Information  Document Information  AMB Correspondence  NEUROSURGERY ANSWERING SERVICE CALL  08/27/2024 12:23  Attached To:  [name]  Source Information  Default, Provider, MD

## 2024-09-02 NOTE — Progress Notes (Signed)
 Documentation for warfarin management Last 3 INR results:  Lab Results  Component Value Date   INR 2.0 (!) 09/02/2024   INR 1.8 (!) 08/07/2024   INR 2.4 (!) 07/22/2024    Dosage adjustment or any change in the treatment regimen today: No.  INR within goal range. Patient reports taking one-time increased warfarin dose as instructed post 08/07/24 sub-therapeutic INR. Patient advised to continue current warfarin dosing regimen of 30mg  total weekly dose, 6mg  (6mg  x 1) Mon,Wed,Fri and 3mg  (1mg  x 3)all other days.   Management of result: nurse driven protocol implemented  Patient/caregiver education provided during today's visit: dosing.  Patient/caregiver did verbalize understanding of the no change in dose and any education provided.   The patient/caregiver is instructed to return in two weeks for recheck.  Future Appointments     Date/Time Provider Department Center Visit Type   09/16/2024 2:30 PM [facility] NURSE POD C [place] C NURSE VISIT   09/16/2024 2:30 PM [facility]ANTICO[facility] MARYL BROCKS ANTI-COAG   11/18/2024 10:15 AM Paraschos, Marsa, MD [facility] C FOLLOW UP   12/02/2024 10:15 AM Maree Jannett Hering, MD [facility] C RETURN VISIT   01/15/2025 10:00 AM [facility] C LAB   01/22/2025 10:00 AM Fernande Ophelia Marinell DOUGLAS, MD [facility] C PHYSICAL

## 2024-09-05 ENCOUNTER — Telehealth: Payer: Self-pay

## 2024-09-05 ENCOUNTER — Other Ambulatory Visit: Payer: Self-pay

## 2024-09-05 ENCOUNTER — Ambulatory Visit: Payer: Self-pay | Admitting: Neurosurgery

## 2024-09-05 ENCOUNTER — Ambulatory Visit: Admitting: Neurosurgery

## 2024-09-05 DIAGNOSIS — M4712 Other spondylosis with myelopathy, cervical region: Secondary | ICD-10-CM

## 2024-09-05 DIAGNOSIS — R262 Difficulty in walking, not elsewhere classified: Secondary | ICD-10-CM | POA: Insufficient documentation

## 2024-09-05 DIAGNOSIS — Z981 Arthrodesis status: Secondary | ICD-10-CM

## 2024-09-05 DIAGNOSIS — R29898 Other symptoms and signs involving the musculoskeletal system: Secondary | ICD-10-CM

## 2024-09-05 DIAGNOSIS — M50321 Other cervical disc degeneration at C4-C5 level: Secondary | ICD-10-CM

## 2024-09-05 DIAGNOSIS — Z01818 Encounter for other preprocedural examination: Secondary | ICD-10-CM

## 2024-09-05 NOTE — Progress Notes (Signed)
 Hide a follow-up phone visit with Suzanne Dixon today.  She was at home and I was in the office.  She gave consent to go forward with a phone visit.  She has a history of cervical myelopathy with worsening cervical myeloradiculopathy.  When we last saw her in clinic we planned for cervical CT scan to evaluate for her bony anatomy.  And then are following up on that today.  We discussed the risks and benefits of an anterior versus posterior approach to her cervical spondylosis and myelopathy for adjacent segment disease.  Given her continued swallowing difficulties and subjective worsening of her swallowing difficulties in the face of her previous multiple anterior cervical spinal operations we will plan doing a posterior approach.  This would involve a C3-C5 posterior spinal instrumentation and fusion and a C3-4 cervical laminectomy and decompression.  Given her bone quality I would like to extend down to the C5 level for 1 extra area of purchase as she has had some adjacent level issues in the past.  She would like to do this after the new year and we will have our team reach out to schedule this so we can work on medical clearance.  [name], MD  Spent 10 minutes on call today

## 2024-09-05 NOTE — Addendum Note (Signed)
 Addended by: Alden Feagan E on: 09/05/2024 04:19 PM   Modules accepted: Orders

## 2024-09-05 NOTE — Telephone Encounter (Signed)
 Called patient to schedule surgery and instructions listed below. Printed out physical copy and placed in outgoing mail per patient's request.  Please see below for information in regards to your upcoming surgery:   Planned surgery: C3-4 laminectomy, C3-5 posterior instrumentation and arthrodesis, autograft harvest.   Surgery date: 09/20/24 at [facility] (Medical Mall: [address], [place], [place] [address]) - you will find out your arrival time the business day before your surgery.    Pre-op appointment at [facility] Pre-admit Testing: you will receive a call with a date/time for this appointment. If you are scheduled for an in person appointment, Pre-admit Testing is located on the first floor of the [facility] building, [address], [address]. During this appointment, they will advise you which medications you can take the morning of surgery, and which medications you will need to hold for surgery. Labs (such as blood work, EKG) may be done at your pre-op appointment. You are not required to fast for these labs. Should you need to change your pre-op appointment, please call Pre-admit testing at [phone].     Blood thinners:   Warfarin (coumadin ): stop Warfarin 7 days prior, resume Warfarin 14 days after     Surgical clearance: we will send a clearance form to Ophelia Sage, MD. They may wish to see you in their office prior to signing the clearance form. If so, they may call you to schedule an appointment.    NSAIDS (Non-steroidal anti-inflammatory drugs): because you are having a fusion, please avoid taking any NSAIDS (examples: ibuprofen, motrin, aleve, naproxen, meloxicam , diclofenac) for 3 months after surgery. Celebrex is an exception and is OK to take, if prescribed. Tylenol  is not an NSAID.    Common restrictions after spine surgery: No bending, lifting, or twisting (BLT). Avoid lifting objects heavier than 10 pounds for the  first 6 weeks after surgery. Where possible, avoid household activities that involve lifting, bending, reaching, pushing, or pulling such as laundry, vacuuming, grocery shopping, and childcare. Try to arrange for help from friends and family for these activities while you heal. Do not drive while taking prescription pain medication. Weeks 6 through 12 after surgery: avoid lifting more than 25 pounds.     X-rays after surgery: Because you are having a fusion or arthroplasty: for appointments after your 2 week follow-up: please arrive our office 30 minutes prior to your appointment for x-rays. This applies to every appointment after your 2 week follow-up. Failure to do so may result in your appointment being rescheduled.     How to contact us :  If you have any questions/concerns before or after surgery, you can reach us  at [phone], or you can send a mychart message. We can be reached by phone or mychart 8am-4pm, Monday-Friday.  *Please note: Calls after 4pm are forwarded to a third party answering service. Mychart messages are not routinely monitored during evenings, weekends, and holidays. Please call our office to contact the answering service for urgent concerns during non-business hours.     If you have FMLA/disability paperwork, please drop it off or fax it to [phone]   Appointments/FMLA & disability paperwork: Reche Hait, & Nichole Registered Nurse/Surgery scheduler: Kendelyn, RN & Katie, RN Certified Medical Assistants: Don, CMA, Elenor, CMA, Damien, CMA, & Auston, [place] Physician Assistants: Lyle Decamp, PA-C, Edsel Goods, PA-C & Glade Boys, PA-C Surgeons: Penne Sharps, MD & [name], MD    [facility] PREADMIT TESTING VISIT and SURGERY INFORMATION SHEET  Now that surgery has been scheduled you can anticipate several phone calls from [facility]. A pharmacy technician will call you to verify your current list of  medications taken at home.               The Pre-Service Center will call to verify your insurance information and to give you billing estimates and information.             The Preadmit Testing Office will be calling to schedule a visit to obtain information for the anesthesia team and provide instructions on preparation for surgery.  What can you expect for the Preadmit Testing Visit: Appointments may be scheduled in-person or by telephone.  If a telephone visit is scheduled, you may be asked to come into the office to have lab tests or other studies performed.   This visit will not be completed any greater than 14 days prior to your surgery.  If your surgery has been scheduled for a future date, please do not be alarmed if we have not contacted you to schedule an appointment more than a month prior to the surgery date.    Please be prepared to provide the following information during this appointment:            -Personal medical history                                               -Medication and allergy list            -Any history of problems with anesthesia              -Recent lab work or diagnostic studies            -Please notify us  of any needs we should be aware of to provide the best care possible           -You will be provided with instructions on how to prepare for your surgery.    On The Day of Surgery:  You must have a driver to take you home after surgery, you will be asked not to drive for 24 hours following surgery.  Taxi, Gisele and non-medical transport will not be acceptable means of transportation unless you have a responsible individual who will be traveling with you.  Visitors in the surgical area:   2 people will be able to visit you in your room once your preparation for surgery has been completed. During surgery, your visitors will be asked to wait in the Surgery Waiting Area.  It is not a requirement for them to stay, if they prefer to leave and come back.  Your  visitor(s) will be given an update once the surgery has been completed.  No visitors are allowed in the initial recovery room to respect patient privacy and safety.  Once you are more awake and transfer to the secondary recovery area, or are transferred to an inpatient room, visitors will again be able to see you.  To respect and protect your privacy: We will ask on the day of surgery who your driver will be and what the contact number for that individual will be. We will ask if it is okay to share information with this individual, or if there is an alternative individual that we, or the surgeon, should contact to provide updates and information. If family  or friends come to the surgical information desk requesting information about you, who you have not listed with us , no information will be given.   It may be helpful to designate someone as the main contact who will be responsible for updating your other friends and family.    PREADMIT TESTING OFFICE: [phone] SAME DAY SURGERY: [phone] We look forward to caring for you before and throughout the process of your surgery.

## 2024-09-10 NOTE — Progress Notes (Signed)
 Please remove from chart, cancelled appointment

## 2024-09-16 ENCOUNTER — Encounter
Admission: RE | Admit: 2024-09-16 | Discharge: 2024-09-16 | Disposition: A | Discharge status: Home / Self Care | Source: Ambulatory Visit | Attending: Neurosurgery | Admitting: Neurosurgery

## 2024-09-16 ENCOUNTER — Telehealth: Payer: Self-pay

## 2024-09-16 ENCOUNTER — Other Ambulatory Visit: Payer: Self-pay

## 2024-09-16 VITALS — BP 142/88 | HR 74 | Resp 14 | Ht 64.0 in | Wt 231.0 lb

## 2024-09-16 DIAGNOSIS — I10 Essential (primary) hypertension: Secondary | ICD-10-CM | POA: Diagnosis not present

## 2024-09-16 DIAGNOSIS — Z87440 Personal history of urinary (tract) infections: Secondary | ICD-10-CM | POA: Diagnosis not present

## 2024-09-16 DIAGNOSIS — E669 Obesity, unspecified: Secondary | ICD-10-CM | POA: Diagnosis not present

## 2024-09-16 DIAGNOSIS — I480 Paroxysmal atrial fibrillation: Secondary | ICD-10-CM | POA: Diagnosis not present

## 2024-09-16 DIAGNOSIS — Z6830 Body mass index (BMI) 30.0-30.9, adult: Secondary | ICD-10-CM | POA: Insufficient documentation

## 2024-09-16 DIAGNOSIS — Z7901 Long term (current) use of anticoagulants: Secondary | ICD-10-CM | POA: Diagnosis not present

## 2024-09-16 DIAGNOSIS — Z01818 Encounter for other preprocedural examination: Secondary | ICD-10-CM | POA: Diagnosis present

## 2024-09-16 DIAGNOSIS — R3129 Other microscopic hematuria: Secondary | ICD-10-CM | POA: Diagnosis not present

## 2024-09-16 DIAGNOSIS — D6869 Other thrombophilia: Secondary | ICD-10-CM | POA: Diagnosis not present

## 2024-09-16 DIAGNOSIS — I639 Cerebral infarction, unspecified: Secondary | ICD-10-CM | POA: Diagnosis not present

## 2024-09-16 HISTORY — DX: Obstructive sleep apnea (adult) (pediatric): G47.33

## 2024-09-16 HISTORY — DX: Unspecified osteoarthritis, unspecified site: M19.90

## 2024-09-16 HISTORY — DX: Anxiety disorder, unspecified: F41.9

## 2024-09-16 LAB — CBC
HCT: 39 % (ref 36.0–46.0)
Hemoglobin: 12.6 g/dL (ref 12.0–15.0)
MCH: 30 pg (ref 26.0–34.0)
MCHC: 32.3 g/dL (ref 30.0–36.0)
MCV: 92.9 fL (ref 80.0–100.0)
Platelets: 203 K/uL (ref 150–400)
RBC: 4.2 MIL/uL (ref 3.87–5.11)
RDW: 15.4 % (ref 11.5–15.5)
WBC: 5.2 K/uL (ref 4.0–10.5)
nRBC: 0 % (ref 0.0–0.2)

## 2024-09-16 LAB — BASIC METABOLIC PANEL WITH GFR
Anion gap: 9 (ref 5–15)
BUN: 23 mg/dL (ref 8–23)
CO2: 29 mmol/L (ref 22–32)
Calcium: 9.2 mg/dL (ref 8.9–10.3)
Chloride: 104 mmol/L (ref 98–111)
Creatinine, Ser: 0.81 mg/dL (ref 0.44–1.00)
GFR, Estimated: >60 mL/min
Glucose, Bld: 70 mg/dL (ref 70–99)
Potassium: 4.2 mmol/L (ref 3.5–5.1)
Sodium: 142 mmol/L (ref 135–145)

## 2024-09-16 LAB — SURGICAL PCR SCREEN
MRSA, PCR: NEGATIVE
Staphylococcus aureus: POSITIVE — AB

## 2024-09-16 LAB — TYPE AND SCREEN
ABO/RH(D): B POS
Antibody Screen: NEGATIVE

## 2024-09-16 NOTE — Progress Notes (Addendum)
 Medical Clearance on chart from Dr Fernande. Moderate Risk.Hold Coumadin  7 days prior to surgery. Last dose will be on 09-18-24

## 2024-09-16 NOTE — Patient Instructions (Addendum)
 Your procedure is scheduled on: Thursday  09/26/24 To find out your arrival time, please call [phone] between 1PM - 3PM on: Wednesday 09/25/24 Report to the Registration Desk on the 1st floor of the Medical Mall. If your arrival time is 6:00 am, do not arrive before that time as the Medical Mall entrance doors do not open until 6:00 am.  REMEMBER: Instructions that are not followed completely may result in serious medical risk, up to and including death; or upon the discretion of your surgeon and anesthesiologist your surgery may need to be rescheduled.  Do not eat food or drink any liquids after midnight the night before surgery.  No gum chewing or hard candies.  One week prior to surgery: Stop Anti-inflammatories (NSAIDS) such as Advil, Aleve, Ibuprofen, Motrin, Naproxen, Naprosyn and Aspirin  based products such as Excedrin, Goody's Powder, BC Powder.   You may however, continue to take Tylenol  if needed for pain up until the day of surgery.  Stop ANY OVER THE COUNTER supplements and vitamins for at least 7 days until after surgery. (Stop your calcium  and B12 last dose 09/18/24)  **Follow recommendations regarding stopping blood thinners.** Your last dose of Coumadin  should have been 09/18/24  Continue taking all of your other prescription medications up until the day of surgery.  ON THE DAY OF SURGERY ONLY TAKE THESE MEDICATIONS WITH SIPS OF WATER :  allopurinol  (ZYLOPRIM ) 300 MG tablet  baclofen  (LIORESAL ) 10 MG tablet  DULoxetine  (CYMBALTA ) 60 MG capsule  FLUoxetine (PROZAC) 10 MG tablet  gabapentin  (NEURONTIN ) 300 MG capsule  levothyroxine  (SYNTHROID , LEVOTHROID) 50 MCG tablet  metoprolol  tartrate (LOPRESSOR ) 100 MG tablet  omeprazole (PRILOSEC) 20 MG capsule   No Alcohol for 24 hours before or after surgery.  No Smoking including e-cigarettes for 24 hours before surgery.  No chewable tobacco products for at least 6 hours before surgery.  No nicotine patches on the day of  surgery.  Do not use any recreational drugs for at least a week (preferably 2 weeks) before your surgery.  Please be advised that the combination of cocaine and anesthesia may have negative outcomes, up to and including death. If you test positive for cocaine, your surgery will be cancelled.  On the morning of surgery brush your teeth with toothpaste and water , you may rinse your mouth with mouthwash if you wish. Do not swallow any toothpaste or mouthwash.  Use CHG Soap or wipes as directed on instruction sheet. (You can pick this up at our office in the [facility], the building to the left of the [employer], [address] at [address]  Do not shave body hair from the neck down 48 hours before surgery.  Do not wear lotions, powders, or perfumes the night before surgery or on the day of surgery   Wear comfortable clothing (specific to your surgery type) to the hospital.  Do not wear jewelry, make-up, hairpins, clips or nail polish.  For welded (permanent) jewelry: bracelets, anklets, waist bands, etc.  Please have this removed prior to surgery.  If it is not removed, there is a chance that hospital personnel will need to cut it off on the day of surgery.  Contact lenses, hearing aids and dentures may not be worn into surgery.  Do not bring valuables to the hospital. [facility] is not responsible for any missing/lost belongings or valuables.   Bring your C-PAP to the hospital in case you may have to spend the night.  Notify your doctor if there is any change in your medical condition (cold, fever, infection).  After surgery, you can help prevent lung complications by doing breathing exercises.  Take deep breaths and cough every 1-2 hours. Your doctor may order a device called an Incentive Spirometer to help you take deep breaths.  If you are being admitted to the hospital overnight, leave your suitcase in the car. After surgery it may be brought to  your room.  In case of increased patient census, it may be necessary for you, the patient, to continue your postoperative care in the Same Day Surgery department.  Please call the Pre-admissions Testing Dept. at [phone] if you have any questions about these instructions.  Surgery Visitation Policy:  Patients having surgery or a procedure may have two visitors.  Children under the age of 63 must have an adult with them who is not the patient.  Inpatient Visitation:    Visiting hours are 7 a.m. to 8 p.m. Up to four visitors are allowed at one time in a patient room. The visitors may rotate out with other people during the day.  One visitor age 61 or older may stay with the patient overnight and must be in the room by 8 p.m.   Merchandiser, Retail to address health-related social needs:  [email or web address]    Pre-operative 4 CHG Bath Instructions   You can play a key role in reducing the risk of infection after surgery. Your skin needs to be as free of germs as possible. You can reduce the number of germs on your skin by washing with CHG (chlorhexidine  gluconate) soap before surgery. CHG is an antiseptic soap that kills germs and continues to kill germs even after washing.   DO NOT use if you have an allergy to chlorhexidine /CHG or antibacterial soaps. If your skin becomes reddened or irritated, stop using the CHG and notify one of our RNs at [phone].   Please shower with the CHG soap starting 4 days before surgery using the following schedule:   Sunday 09/22/24 - Wednesday 09/25/24    Please keep in mind the following:  DO NOT shave, including legs and underarms, starting the day of your first shower.   You may shave your face at any point before/day of surgery.  Place clean sheets on your bed the day you start using CHG soap. Use a clean washcloth (not used since being washed) for each shower. DO NOT sleep with pets once you start using the CHG.    CHG Shower Instructions:  If you choose to wash your hair and private area, wash first with your normal shampoo/soap.  After you use shampoo/soap, rinse your hair and body thoroughly to remove shampoo/soap residue.  Turn the water  OFF and apply about 3 tablespoons (45 ml) of CHG soap to a CLEAN washcloth.  Apply CHG soap ONLY FROM YOUR NECK DOWN TO YOUR TOES (washing for 3-5 minutes)  DO NOT use CHG soap on face, private areas, open wounds, or sores.  Pay special attention to the area where your surgery is being performed.  If you are having back surgery, having someone wash your back for you may be helpful. Wait 2 minutes after CHG soap is applied, then you may rinse off the CHG soap.  Pat dry with a clean towel  Put on clean clothes/pajamas   If you choose to wear lotion, please use ONLY the CHG-compatible lotions on the back of this paper.     Additional  instructions for the day of surgery: DO NOT APPLY any lotions, deodorants, cologne, or perfumes.   Put on clean/comfortable clothes.  Brush your teeth.  Ask your nurse before applying any prescription medications to the skin.      CHG Compatible Lotions   Aveeno Moisturizing lotion  Cetaphil Moisturizing Cream  Cetaphil Moisturizing Lotion  Clairol Herbal Essence Moisturizing Lotion, Dry Skin  Clairol Herbal Essence Moisturizing Lotion, Extra Dry Skin  Clairol Herbal Essence Moisturizing Lotion, Normal Skin  Curel Age Defying Therapeutic Moisturizing Lotion with Alpha Hydroxy  Curel Extreme Care Body Lotion  Curel Soothing Hands Moisturizing Hand Lotion  Curel Therapeutic Moisturizing Cream, Fragrance-Free  Curel Therapeutic Moisturizing Lotion, Fragrance-Free  Curel Therapeutic Moisturizing Lotion, Original Formula  Eucerin Daily Replenishing Lotion  Eucerin Dry Skin Therapy Plus Alpha Hydroxy Crme  Eucerin Dry Skin Therapy Plus Alpha Hydroxy Lotion  Eucerin Original Crme  Eucerin Original Lotion  Eucerin Plus  Crme Eucerin Plus Lotion  Eucerin TriLipid Replenishing Lotion  Keri Anti-Bacterial Hand Lotion  Keri Deep Conditioning Original Lotion Dry Skin Formula Softly Scented  Keri Deep Conditioning Original Lotion, Fragrance Free Sensitive Skin Formula  Keri Lotion Fast Absorbing Fragrance Free Sensitive Skin Formula  Keri Lotion Fast Absorbing Softly Scented Dry Skin Formula  Keri Original Lotion  Keri Skin Renewal Lotion Keri Silky Smooth Lotion  Keri Silky Smooth Sensitive Skin Lotion  Nivea Body Creamy Conditioning Oil  Nivea Body Extra Enriched Lotion  Nivea Body Original Lotion  Nivea Body Sheer Moisturizing Lotion Nivea Crme  Nivea Skin Firming Lotion  NutraDerm 30 Skin Lotion  NutraDerm Skin Lotion  NutraDerm Therapeutic Skin Cream  NutraDerm Therapeutic Skin Lotion  ProShield Protective Hand Cream  Provon moisturizing lotion  How to Use an Incentive Spirometer  An incentive spirometer is a tool that measures how well you are filling your lungs with each breath. Learning to take long, deep breaths using this tool can help you keep your lungs clear and active. This may help to reverse or lessen your chance of developing breathing (pulmonary) problems, especially infection. You may be asked to use a spirometer: After a surgery. If you have a lung problem or a history of smoking. After a long period of time when you have been unable to move or be active. If the spirometer includes an indicator to show the highest number that you have reached, your health care provider or respiratory therapist will help you set a goal. Keep a log of your progress as told by your health care provider. What are the risks? Breathing too quickly may cause dizziness or cause you to pass out. Take your time so you do not get dizzy or light-headed. If you are in pain, you may need to take pain medicine before doing incentive spirometry. It is harder to take a deep breath if you are having pain.   Sit up on  the edge of your bed or on a chair. Hold the incentive spirometer so that it is in an upright position. Before you use the spirometer, breathe out normally. Place the mouthpiece in your mouth. Make sure your lips are closed tightly around it. Breathe in slowly and as deeply as you can through your mouth, causing the piston or the ball to rise toward the top of the chamber. Hold your breath for 3-5 seconds, or for as long as possible. If the spirometer includes a coach indicator, use this to guide you in breathing. Slow down your breathing if the indicator goes above  the marked areas. Remove the mouthpiece from your mouth and breathe out normally. The piston or ball will return to the bottom of the chamber. Rest for a few seconds, then repeat the steps 10 or more times. Take your time and take a few normal breaths between deep breaths so that you do not get dizzy or light-headed. Do this every 1-2 hours when you are awake. If the spirometer includes a goal marker to show the highest number you have reached (best effort), use this as a goal to work toward during each repetition. After each set of 10 deep breaths, cough a few times. This will help to make sure that your lungs are clear. If you have an incision on your chest or abdomen from surgery, place a pillow or a rolled-up towel firmly against the incision when you cough. This can help to reduce pain while taking deep breaths and coughing. General tips When you are able to get out of bed: Walk around often. Continue to take deep breaths and cough in order to clear your lungs. Keep using the incentive spirometer until your health care provider says it is okay to stop using it. If you have been in the hospital, you may be told to keep using the spirometer at home. Contact a health care provider if: You are having difficulty using the spirometer. You have trouble using the spirometer as often as instructed. Your pain medicine is not giving  enough relief for you to use the spirometer as told. You have a fever. Get help right away if: You develop shortness of breath. You develop a cough with bloody mucus from the lungs. You have fluid or blood coming from an incision site after you cough. Summary An incentive spirometer is a tool that can help you learn to take long, deep breaths to keep your lungs clear and active. You may be asked to use a spirometer after a surgery, if you have a lung problem or a history of smoking, or if you have been inactive for a long period of time. Use your incentive spirometer as instructed every 1-2 hours while you are awake. If you have an incision on your chest or abdomen, place a pillow or a rolled-up towel firmly against your incision when you cough. This will help to reduce pain. Get help right away if you have shortness of breath, you cough up bloody mucus, or blood comes from your incision when you cough. This information is not intended to replace advice given to you by your health care provider. Make sure you discuss any questions you have with your health care provider. Document Revised: 11/25/2019 Document Reviewed: 11/25/2019 Elsevier Patient Education  2023 [employer].

## 2024-09-16 NOTE — Telephone Encounter (Signed)
 Received secure chat from [place], RN with pre-admit testing, that Suzanne Dixon did not stop her coumadin  for surgery (last dose was yesterday). Per discussion with Dr Claudene, surgery will be postponed to 09/26/24. Cherie has discussed this with Suzanne Dixon during her pre-admit testing appointment.  Planned surgery: C3-4 laminectomy, C3-5 posterior instrumentation and arthrodesis, autograft harvest.     Surgery date: 09/26/24 at [facility]: [address], [place], [place] [address]) - you will find out your arrival time the business day before your surgery.       Pre-op appointment at [facility] Pre-admit Testing: you will receive a call with a date/time for this appointment. If you are scheduled for an in person appointment, Pre-admit Testing is located on the first floor of the [facility], [address], [address]. During this appointment, they will advise you which medications you can take the morning of surgery, and which medications you will need to hold for surgery. Labs (such as blood work, EKG) may be done at your pre-op appointment. You are not required to fast for these labs. Should you need to change your pre-op appointment, please call Pre-admit testing at [phone].        Blood thinners:    Warfarin (coumadin ): stop Warfarin 7 days prior, resume Warfarin 14 days after        Surgical clearance: we will send a clearance form to Ophelia Sage, MD. They may wish to see you in their office prior to signing the clearance form. If so, they may call you to schedule an appointment.       NSAIDS (Non-steroidal anti-inflammatory drugs): because you are having a fusion, please avoid taking any NSAIDS (examples: ibuprofen, motrin, aleve, naproxen, meloxicam , diclofenac) for 3 months after surgery. Celebrex is an exception and is OK to take, if prescribed. Tylenol  is not an NSAID.       Common restrictions after spine surgery: No  bending, lifting, or twisting (BLT). Avoid lifting objects heavier than 10 pounds for the first 6 weeks after surgery. Where possible, avoid household activities that involve lifting, bending, reaching, pushing, or pulling such as laundry, vacuuming, grocery shopping, and childcare. Try to arrange for help from friends and family for these activities while you heal. Do not drive while taking prescription pain medication. Weeks 6 through 12 after surgery: avoid lifting more than 25 pounds.        X-rays after surgery: Because you are having a fusion or arthroplasty: for appointments after your 2 week follow-up: please arrive our office 30 minutes prior to your appointment for x-rays. This applies to every appointment after your 2 week follow-up. Failure to do so may result in your appointment being rescheduled.        How to contact us :  If you have any questions/concerns before or after surgery, you can reach us  at [phone], or you can send a mychart message. We can be reached by phone or mychart 8am-4pm, Monday-Friday.  *Please note: Calls after 4pm are forwarded to a third party answering service. Mychart messages are not routinely monitored during evenings, weekends, and holidays. Please call our office to contact the answering service for urgent concerns during non-business hours.         If you have FMLA/disability paperwork, please drop it off or fax it to [phone]     Appointments/FMLA & disability paperwork: Reche Hait, & Nichole Registered Nurse/Surgery scheduler: Smith Potenza, RN & Izetta, RN Certified Medical Assistants: Don,  CMA, Elenor, CMA, [place], CMA, & [place], [place] Physician Assistants: Lyle Decamp, PA-C, Edsel Goods, PA-C & Glade Boys, PA-C Surgeons: Penne Sharps, MD & [name], MD       [facility] PREADMIT TESTING VISIT and SURGERY INFORMATION SHEET    Now that surgery has been scheduled you can anticipate several phone  calls from [facility] services. A pharmacy technician will call you to verify your current list of medications taken at home.               The Pre-Service Center will call to verify your insurance information and to give you billing estimates and information.             The Preadmit Testing Office will be calling to schedule a visit to obtain information for the anesthesia team and provide instructions on preparation for surgery.   What can you expect for the Preadmit Testing Visit: Appointments may be scheduled in-person or by telephone.  If a telephone visit is scheduled, you may be asked to come into the office to have lab tests or other studies performed.   This visit will not be completed any greater than 14 days prior to your surgery.  If your surgery has been scheduled for a future date, please do not be alarmed if we have not contacted you to schedule an appointment more than a month prior to the surgery date.     Please be prepared to provide the following information during this appointment:            -Personal medical history                                               -Medication and allergy list            -Any history of problems with anesthesia              -Recent lab work or diagnostic studies            -Please notify us  of any needs we should be aware of to provide the best care possible           -You will be provided with instructions on how to prepare for your surgery.       On The Day of Surgery:   You must have a driver to take you home after surgery, you will be asked not to drive for 24 hours following surgery.  Taxi, Gisele and non-medical transport will not be acceptable means of transportation unless you have a responsible individual who will be traveling with you.   Visitors in the surgical area:   2 people will be able to visit you in your room once your preparation for surgery has been completed. During surgery, your visitors will be asked to wait in the  Surgery Waiting Area.  It is not a requirement for them to stay, if they prefer to leave and come back.  Your visitor(s) will be given an update once the surgery has been completed.   No visitors are allowed in the initial recovery room to respect patient privacy and safety.   Once you are more awake and transfer to the secondary recovery area, or are transferred to an inpatient room, visitors will again be able to see you.   To respect and protect your  privacy: We will ask on the day of surgery who your driver will be and what the contact number for that individual will be. We will ask if it is okay to share information with this individual, or if there is an alternative individual that we, or the surgeon, should contact to provide updates and information. If family or friends come to the surgical information desk requesting information about you, who you have not listed with us , no information will be given.   It may be helpful to designate someone as the main contact who will be responsible for updating your other friends and family.     PREADMIT TESTING OFFICE: [phone] SAME DAY SURGERY: [phone] We look forward to caring for you before and throughout the process of your surgery.

## 2024-09-16 NOTE — Telephone Encounter (Signed)
 Placed updated surgery information and post-op appointments in the mail to patient. Only changed 3rd post op appointment. Left message on pt's voicemail about 3rd post op appointment change.

## 2024-09-16 NOTE — Progress Notes (Signed)
 Faxed cardiac clearance over to Dr Ammon with fax confirmation received

## 2024-09-17 DIAGNOSIS — Z01818 Encounter for other preprocedural examination: Secondary | ICD-10-CM | POA: Diagnosis not present

## 2024-09-17 LAB — URINALYSIS, COMPLETE (UACMP) WITH MICROSCOPIC
Bilirubin Urine: NEGATIVE
Glucose, UA: NEGATIVE mg/dL
Ketones, ur: NEGATIVE mg/dL
Nitrite: POSITIVE — AB
Protein, ur: NEGATIVE mg/dL
Specific Gravity, Urine: 1.019 (ref 1.005–1.030)
pH: 5 (ref 5.0–8.0)

## 2024-09-19 ENCOUNTER — Encounter: Payer: Self-pay | Admitting: Urgent Care

## 2024-09-20 ENCOUNTER — Ambulatory Visit: Payer: Self-pay | Admitting: Urgent Care

## 2024-09-20 DIAGNOSIS — Z01812 Encounter for preprocedural laboratory examination: Secondary | ICD-10-CM

## 2024-09-20 DIAGNOSIS — B9620 Unspecified Escherichia coli [E. coli] as the cause of diseases classified elsewhere: Secondary | ICD-10-CM

## 2024-09-20 LAB — URINE CULTURE: Culture: >=100000 — AB

## 2024-09-20 MED ORDER — CEPHALEXIN 500 MG PO CAPS: 500.0000 mg | ORAL_CAPSULE | Freq: Two times a day (BID) | ORAL | 0 refills | Status: AC

## 2024-09-20 NOTE — Progress Notes (Signed)
 " [facility] Perioperative Services: Pre-Admission/Anesthesia Testing  Abnormal Lab Notification and Treatment Plan of Care   Date: 09/20/2024  Name: Suzanne Dixon DOB: 1951/01/23 MRN:   [id number]  Re: Abnormal labs noted during PAT appointment   Notified:  Provider Name Provider Role Notification Mode  Claudene Riis, MD Neurosurgery (Surgeon) Routed and/or faxed via [facility]   Abnormal Lab Value(s):   Lab Results  Component Value Date   COLORURINE YELLOW (A) 09/17/2024   APPEARANCEUR HAZY (A) 09/17/2024   LABSPEC 1.019 09/17/2024   PHURINE 5.0 09/17/2024   GLUCOSEU NEGATIVE 09/17/2024   HGBUR MODERATE (A) 09/17/2024   BILIRUBINUR NEGATIVE 09/17/2024   KETONESUR NEGATIVE 09/17/2024   PROTEINUR NEGATIVE 09/17/2024   NITRITE POSITIVE (A) 09/17/2024   LEUKOCYTESUR TRACE (A) 09/17/2024   EPIU 0-5 09/17/2024   WBCU 0-5 09/17/2024   RBCU 6-10 09/17/2024   BACTERIA MANY (A) 09/17/2024   CULT >=100,000 COLONIES/mL ESCHERICHIA COLI (A) 09/17/2024    Clinical Information and Notes:  Patient is scheduled for an elective C3-4 LAMINECTOMY, C3-5 POSTERIOR INSTRUMENTATION AND ARTHRODESIS, AUTOGRAFT HARVEST on 09/26/2024.    UA performed in PAT consistent with/concerning for infection.  No leukocytosis noted on CBC; WBC 5.2 Renal function: Estimated Creatinine Clearance: 70.8 mL/min (by C-G formula based on SCr of 0.81 mg/dL). Urine C&S added to assess for pathogenically significant growth.  ATTENTION --> PENDING CONVERSATION WITH PT AT THIS TIME -- NOTE/CONTENTS NOT FINAL UNTIL SIGNED  Impression and Plan:  [name] with a UA that was (+) for infection; reflex culture sent. Culture grew out significant Escherichia coli colony counts. Patient was contacted to discuss. Patient reporting that she is experiencing mild UTI symptoms including dysuria, frequency, and urgency. She denies abdominal and back pain. No nausea, vomiting, or fevers. Patient with surgery  scheduled soon. In efforts to avoid delaying patient's procedure, or have her experience any potentially significant perioperative complications related to the aforementioned, I would like to proceed with empiric treatment for urinary tract infection.  Allergies reviewed. Culture report also reviewed to ensure culture appropriate coverage is being provided. Will treat with a 5 day course of CEPHALEXIN . Of note, cephalexin  dosed 500mg  BID has been found to be as equally safe and effective as QID dosing in the treatment of female patients with uncomplicated urinary tract infections Arthur et al., 2023). Patient encouraged to complete the entire course of antibiotics even if she begins to feel better. She was advised that if culture demonstrates resistance to the prescribed antibiotic, she will be contacted and advised of the need to change the antibiotic being used to treat her infection.   Meds ordered this encounter  Medications   cephALEXin  (KEFLEX ) 500 MG capsule    Sig: Take 1 capsule (500 mg total) by mouth 2 (two) times daily for 5 days. Increase WATER  intake while taking this medication.    Dispense:  10 capsule    Refill:  0    Please contact the patient as soon as it is available for pickup. Rx is for preoperative UTI treatment and needs to be started ASAP.   Patient encouraged to increase her fluid intake as much as possible. Discussed that water  is always best to flush the urinary tract. She was advised to avoid caffeine containing fluids until her infections clears, as caffeine can cause her to experience painful bladder spasms.   May use Tylenol  as needed for pain/fever should she experience these symptoms.   Patient instructed to call surgeon's office or  PAT with any questions or concerns related to the above outlined course of treatment. Additionally, she was instructed to call if she feels like she is getting worse overall while on treatment. Results and treatment plan of care  forwarded to primary attending surgeon to make them aware.   Encounter Diagnoses  Name Primary?   Pre-operative laboratory examination Yes   E. coli UTI (urinary tract infection)    Citation: Brad DELENA Chang HM, Eid K, Jameson AP, Dumkow LE. Two Times Versus Four Times Daily Cephalexin  Dosing for the Treatment of Uncomplicated Urinary Tract Infections in Females. Open Forum Infect Dis. 2023 Aug 11;10(9):ofad430. doi: 10.1093/ofid/ofad430. PMID: 62220402; PMCID: EFR89458707.  Dorise Pereyra, MSN, APRN, FNP-C, CEN [facility]  Perioperative Services Nurse Practitioner Phone: [phone] Fax: [phone] 09/20/2024 10:40 AM  NOTE: This note has been prepared using Dragon dictation software. Despite my best ability to proofread, there is always the potential that unintentional transcriptional errors may still occur from this process.  "

## 2024-09-26 ENCOUNTER — Encounter: Payer: Self-pay | Admitting: Neurosurgery

## 2024-09-26 ENCOUNTER — Ambulatory Visit

## 2024-09-26 ENCOUNTER — Ambulatory Visit: Payer: Self-pay | Admitting: Urgent Care

## 2024-09-26 ENCOUNTER — Observation Stay
Admission: RE | Admit: 2024-09-26 | Discharge: 2024-09-29 | Disposition: A | Discharge status: Home / Self Care | Attending: Neurosurgery | Admitting: Neurosurgery

## 2024-09-26 ENCOUNTER — Encounter: Admission: RE | Payer: Self-pay | Source: Home / Self Care

## 2024-09-26 ENCOUNTER — Other Ambulatory Visit: Payer: Self-pay

## 2024-09-26 DIAGNOSIS — Z981 Arthrodesis status: Secondary | ICD-10-CM

## 2024-09-26 DIAGNOSIS — R29898 Other symptoms and signs involving the musculoskeletal system: Secondary | ICD-10-CM | POA: Insufficient documentation

## 2024-09-26 DIAGNOSIS — M50321 Other cervical disc degeneration at C4-C5 level: Secondary | ICD-10-CM

## 2024-09-26 DIAGNOSIS — I480 Paroxysmal atrial fibrillation: Secondary | ICD-10-CM

## 2024-09-26 DIAGNOSIS — Z01812 Encounter for preprocedural laboratory examination: Secondary | ICD-10-CM

## 2024-09-26 DIAGNOSIS — E669 Obesity, unspecified: Secondary | ICD-10-CM

## 2024-09-26 DIAGNOSIS — Z01818 Encounter for other preprocedural examination: Secondary | ICD-10-CM

## 2024-09-26 DIAGNOSIS — Z87440 Personal history of urinary (tract) infections: Secondary | ICD-10-CM

## 2024-09-26 DIAGNOSIS — I10 Essential (primary) hypertension: Secondary | ICD-10-CM

## 2024-09-26 DIAGNOSIS — G959 Disease of spinal cord, unspecified: Secondary | ICD-10-CM | POA: Diagnosis present

## 2024-09-26 DIAGNOSIS — I639 Cerebral infarction, unspecified: Secondary | ICD-10-CM

## 2024-09-26 DIAGNOSIS — N390 Urinary tract infection, site not specified: Secondary | ICD-10-CM

## 2024-09-26 DIAGNOSIS — R8281 Pyuria: Secondary | ICD-10-CM

## 2024-09-26 DIAGNOSIS — M4712 Other spondylosis with myelopathy, cervical region: Secondary | ICD-10-CM | POA: Diagnosis present

## 2024-09-26 DIAGNOSIS — M5412 Radiculopathy, cervical region: Principal | ICD-10-CM

## 2024-09-26 DIAGNOSIS — R3129 Other microscopic hematuria: Secondary | ICD-10-CM

## 2024-09-26 DIAGNOSIS — Z7901 Long term (current) use of anticoagulants: Secondary | ICD-10-CM

## 2024-09-26 DIAGNOSIS — A498 Other bacterial infections of unspecified site: Secondary | ICD-10-CM

## 2024-09-26 DIAGNOSIS — M4802 Spinal stenosis, cervical region: Secondary | ICD-10-CM | POA: Diagnosis not present

## 2024-09-26 DIAGNOSIS — R8271 Bacteriuria: Secondary | ICD-10-CM

## 2024-09-26 DIAGNOSIS — R262 Difficulty in walking, not elsewhere classified: Secondary | ICD-10-CM | POA: Diagnosis present

## 2024-09-26 DIAGNOSIS — D6869 Other thrombophilia: Secondary | ICD-10-CM

## 2024-09-26 HISTORY — PX: POSTERIOR CERVICAL FUSION/FORAMINOTOMY: SHX5038

## 2024-09-26 LAB — PROTIME-INR
INR: 1.1 (ref 0.8–1.2)
Prothrombin Time: 15.1 s (ref 11.4–15.2)

## 2024-09-26 MED ORDER — METHOCARBAMOL 1000 MG/10ML IJ SOLN: 500.0000 mg | Freq: Four times a day (QID) | INTRAMUSCULAR | Status: DC | PRN

## 2024-09-26 MED ORDER — SODIUM CHLORIDE 0.9% FLUSH: 3.0000 mL | INTRAVENOUS | Status: DC | PRN

## 2024-09-26 MED ORDER — ONDANSETRON HCL 4 MG PO TABS: 4.0000 mg | ORAL_TABLET | Freq: Four times a day (QID) | ORAL | Status: DC | PRN

## 2024-09-26 MED ORDER — SENNA 8.6 MG PO TABS
1.0000 | ORAL_TABLET | Freq: Two times a day (BID) | ORAL | Status: DC
  Administered 2024-09-27 – 2024-09-29 (×5): 8.6 mg via ORAL
  Filled 2024-09-26 (×6): qty 1

## 2024-09-26 MED ORDER — PROPOFOL 1000 MG/100ML IV EMUL
INTRAVENOUS | Status: AC
  Filled 2024-09-26: qty 100

## 2024-09-26 MED ORDER — POTASSIUM CHLORIDE CRYS ER 10 MEQ PO TBCR: 10.0000 meq | EXTENDED_RELEASE_TABLET | Freq: Every day | ORAL | Status: DC | PRN

## 2024-09-26 MED ORDER — FENTANYL CITRATE (PF) 100 MCG/2ML IJ SOLN
INTRAMUSCULAR | Status: AC
  Filled 2024-09-26: qty 2

## 2024-09-26 MED ORDER — SUCCINYLCHOLINE CHLORIDE 200 MG/10ML IV SOSY
PREFILLED_SYRINGE | INTRAVENOUS | Status: AC
  Filled 2024-09-26: qty 10

## 2024-09-26 MED ORDER — DEXAMETHASONE SOD PHOSPHATE PF 10 MG/ML IJ SOLN
INTRAMUSCULAR | Status: DC | PRN
  Administered 2024-09-26: 10 mg via INTRAVENOUS

## 2024-09-26 MED ORDER — ONDANSETRON HCL 4 MG/2ML IJ SOLN
INTRAMUSCULAR | Status: DC | PRN
  Administered 2024-09-26: 4 mg via INTRAVENOUS

## 2024-09-26 MED ORDER — CEFAZOLIN SODIUM-DEXTROSE 2-4 GM/100ML-% IV SOLN
2.0000 g | Freq: Once | INTRAVENOUS | Status: AC
  Administered 2024-09-26: 2 g via INTRAVENOUS

## 2024-09-26 MED ORDER — OXYCODONE HCL 5 MG PO TABS
10.0000 mg | ORAL_TABLET | ORAL | Status: DC | PRN
  Filled 2024-09-26: qty 2

## 2024-09-26 MED ORDER — POLYETHYLENE GLYCOL 3350 17 G PO PACK
17.0000 g | PACK | Freq: Every day | ORAL | Status: DC | PRN
  Administered 2024-09-29: 17 g via ORAL
  Filled 2024-09-26: qty 1

## 2024-09-26 MED ORDER — ACETAMINOPHEN 500 MG PO TABS
ORAL_TABLET | ORAL | Status: AC
  Filled 2024-09-26: qty 2

## 2024-09-26 MED ORDER — CHLORHEXIDINE GLUCONATE 4 % EX SOLN
1.0000 | CUTANEOUS | 1 refills | Status: AC
  Filled 2024-09-26: qty 236, 5d supply, fill #0

## 2024-09-26 MED ORDER — FLUOXETINE HCL 10 MG PO CAPS
10.0000 mg | ORAL_CAPSULE | Freq: Every day | ORAL | Status: DC
  Administered 2024-09-26 – 2024-09-29 (×3): 10 mg via ORAL
  Filled 2024-09-26 (×4): qty 1

## 2024-09-26 MED ORDER — FESOTERODINE FUMARATE ER 4 MG PO TB24
4.0000 mg | ORAL_TABLET | Freq: Every day | ORAL | Status: DC
  Administered 2024-09-26 – 2024-09-29 (×4): 4 mg via ORAL
  Filled 2024-09-26 (×4): qty 1

## 2024-09-26 MED ORDER — MIDAZOLAM HCL 2 MG/2ML IJ SOLN
INTRAMUSCULAR | Status: AC
  Filled 2024-09-26: qty 2

## 2024-09-26 MED ORDER — MAGNESIUM CITRATE PO SOLN: 1.0000 | Freq: Once | ORAL | Status: DC | PRN

## 2024-09-26 MED ORDER — MIDAZOLAM HCL (PF) 2 MG/2ML IJ SOLN
INTRAMUSCULAR | Status: DC | PRN
  Administered 2024-09-26: 2 mg via INTRAVENOUS

## 2024-09-26 MED ORDER — BUPIVACAINE HCL (PF) 0.5 % IJ SOLN
INTRAMUSCULAR | Status: AC
  Filled 2024-09-26: qty 30

## 2024-09-26 MED ORDER — LEVOTHYROXINE SODIUM 50 MCG PO TABS
50.0000 ug | ORAL_TABLET | Freq: Every day | ORAL | Status: DC
  Administered 2024-09-27 – 2024-09-29 (×3): 50 ug via ORAL
  Filled 2024-09-26 (×3): qty 1

## 2024-09-26 MED ORDER — BUPIVACAINE-EPINEPHRINE (PF) 0.5% -1:200000 IJ SOLN
INTRAMUSCULAR | Status: AC
  Filled 2024-09-26: qty 10

## 2024-09-26 MED ORDER — OYSTER SHELL CALCIUM/D3 500-5 MG-MCG PO TABS
1.0000 | ORAL_TABLET | Freq: Two times a day (BID) | ORAL | Status: DC
  Administered 2024-09-27 – 2024-09-29 (×4): 1 via ORAL
  Filled 2024-09-26 (×4): qty 1

## 2024-09-26 MED ORDER — IRRISEPT - 450ML BOTTLE WITH 0.05% CHG IN STERILE WATER, USP 99.95% OPTIME
TOPICAL | Status: DC | PRN
  Administered 2024-09-26: 450 mL via TOPICAL

## 2024-09-26 MED ORDER — PROPOFOL 500 MG/50ML IV EMUL
INTRAVENOUS | Status: DC | PRN
  Administered 2024-09-26: 120 ug/kg/min via INTRAVENOUS
  Administered 2024-09-26: 130 ug/kg/min via INTRAVENOUS
  Administered 2024-09-26: 50 mg via INTRAVENOUS
  Administered 2024-09-26: 130 ug/kg/min via INTRAVENOUS
  Administered 2024-09-26: 150 mg via INTRAVENOUS

## 2024-09-26 MED ORDER — PANTOPRAZOLE SODIUM 40 MG PO TBEC
40.0000 mg | DELAYED_RELEASE_TABLET | Freq: Every day | ORAL | Status: DC
  Administered 2024-09-27 – 2024-09-29 (×3): 40 mg via ORAL
  Filled 2024-09-26 (×3): qty 1

## 2024-09-26 MED ORDER — METHOCARBAMOL 500 MG PO TABS
500.0000 mg | ORAL_TABLET | Freq: Four times a day (QID) | ORAL | Status: DC | PRN
  Administered 2024-09-28 – 2024-09-29 (×2): 500 mg via ORAL
  Filled 2024-09-26 (×2): qty 1

## 2024-09-26 MED ORDER — ACETAMINOPHEN 325 MG PO TABS
650.0000 mg | ORAL_TABLET | ORAL | Status: DC | PRN
  Administered 2024-09-28 – 2024-09-29 (×2): 650 mg via ORAL
  Filled 2024-09-26 (×2): qty 2

## 2024-09-26 MED ORDER — OXYCODONE HCL 5 MG PO TABS: 5.0000 mg | ORAL_TABLET | Freq: Once | ORAL | Status: AC | PRN

## 2024-09-26 MED ORDER — ORAL CARE MOUTH RINSE: 15.0000 mL | Freq: Once | OROMUCOSAL | Status: AC

## 2024-09-26 MED ORDER — CHLORHEXIDINE GLUCONATE 0.12 % MT SOLN
15.0000 mL | Freq: Once | OROMUCOSAL | Status: AC
  Administered 2024-09-26: 15 mL via OROMUCOSAL

## 2024-09-26 MED ORDER — ACETAMINOPHEN 10 MG/ML IV SOLN
INTRAVENOUS | Status: DC | PRN
  Administered 2024-09-26: 1000 mg via INTRAVENOUS

## 2024-09-26 MED ORDER — ACETAMINOPHEN 500 MG PO TABS
1000.0000 mg | ORAL_TABLET | Freq: Four times a day (QID) | ORAL | Status: AC
  Administered 2024-09-27 (×3): 1000 mg via ORAL
  Filled 2024-09-26 (×2): qty 2

## 2024-09-26 MED ORDER — ACETAMINOPHEN 10 MG/ML IV SOLN
INTRAVENOUS | Status: AC
  Filled 2024-09-26: qty 100

## 2024-09-26 MED ORDER — ONDANSETRON HCL 4 MG/2ML IJ SOLN
INTRAMUSCULAR | Status: AC
  Filled 2024-09-26: qty 2

## 2024-09-26 MED ORDER — CELECOXIB 200 MG PO CAPS
ORAL_CAPSULE | ORAL | Status: AC
  Filled 2024-09-26: qty 1

## 2024-09-26 MED ORDER — SERTRALINE HCL 50 MG PO TABS
50.0000 mg | ORAL_TABLET | Freq: Every day | ORAL | Status: DC
  Administered 2024-09-26 – 2024-09-29 (×4): 50 mg via ORAL
  Filled 2024-09-26 (×4): qty 1

## 2024-09-26 MED ORDER — HYDROMORPHONE HCL 1 MG/ML IJ SOLN: 1.0000 mg | INTRAMUSCULAR | Status: DC | PRN

## 2024-09-26 MED ORDER — OXYCODONE HCL 5 MG/5ML PO SOLN
ORAL | Status: AC
  Filled 2024-09-26: qty 5

## 2024-09-26 MED ORDER — DOCUSATE SODIUM 100 MG PO CAPS
100.0000 mg | ORAL_CAPSULE | Freq: Two times a day (BID) | ORAL | Status: DC
  Administered 2024-09-27 – 2024-09-29 (×5): 100 mg via ORAL
  Filled 2024-09-26 (×6): qty 1

## 2024-09-26 MED ORDER — ENOXAPARIN SODIUM 60 MG/0.6ML IJ SOSY
50.0000 mg | PREFILLED_SYRINGE | INTRAMUSCULAR | Status: DC
  Administered 2024-09-27 – 2024-09-29 (×3): 50 mg via SUBCUTANEOUS
  Filled 2024-09-26 (×3): qty 0.6

## 2024-09-26 MED ORDER — GABAPENTIN 300 MG PO CAPS
300.0000 mg | ORAL_CAPSULE | Freq: Four times a day (QID) | ORAL | Status: DC
  Administered 2024-09-26 – 2024-09-29 (×10): 300 mg via ORAL
  Filled 2024-09-26 (×2): qty 1
  Filled 2024-09-26 (×2): qty 3
  Filled 2024-09-26 (×5): qty 1

## 2024-09-26 MED ORDER — GABAPENTIN 300 MG PO CAPS
300.0000 mg | ORAL_CAPSULE | Freq: Once | ORAL | Status: AC
  Administered 2024-09-26: 300 mg via ORAL

## 2024-09-26 MED ORDER — HYDRALAZINE HCL 20 MG/ML IJ SOLN
5.0000 mg | Freq: Once | INTRAMUSCULAR | Status: AC
  Administered 2024-09-26: 5 mg via INTRAVENOUS

## 2024-09-26 MED ORDER — ALUM & MAG HYDROXIDE-SIMETH 200-200-20 MG/5ML PO SUSP: 30.0000 mL | Freq: Four times a day (QID) | ORAL | Status: DC | PRN

## 2024-09-26 MED ORDER — CELECOXIB 200 MG PO CAPS
200.0000 mg | ORAL_CAPSULE | Freq: Once | ORAL | Status: AC
  Administered 2024-09-26: 200 mg via ORAL

## 2024-09-26 MED ORDER — CEFAZOLIN SODIUM-DEXTROSE 2-4 GM/100ML-% IV SOLN
INTRAVENOUS | Status: AC
  Filled 2024-09-26: qty 100

## 2024-09-26 MED ORDER — FENTANYL CITRATE (PF) 100 MCG/2ML IJ SOLN
INTRAMUSCULAR | Status: DC | PRN
  Administered 2024-09-26 (×2): 50 ug via INTRAVENOUS

## 2024-09-26 MED ORDER — SODIUM CHLORIDE 0.9 % IV SOLN: 250.0000 mL | INTRAVENOUS | Status: AC

## 2024-09-26 MED ORDER — ACETAMINOPHEN 650 MG RE SUPP: 650.0000 mg | RECTAL | Status: DC | PRN

## 2024-09-26 MED ORDER — REMIFENTANIL HCL 1 MG IV SOLR
INTRAVENOUS | Status: AC
  Filled 2024-09-26: qty 1000

## 2024-09-26 MED ORDER — BISACODYL 5 MG PO TBEC
5.0000 mg | DELAYED_RELEASE_TABLET | Freq: Every day | ORAL | Status: DC | PRN
  Administered 2024-09-29: 5 mg via ORAL
  Filled 2024-09-26: qty 1

## 2024-09-26 MED ORDER — LACTATED RINGERS IV SOLN: INTRAVENOUS | Status: DC

## 2024-09-26 MED ORDER — GABAPENTIN 300 MG PO CAPS
ORAL_CAPSULE | ORAL | Status: AC
  Filled 2024-09-26: qty 1

## 2024-09-26 MED ORDER — PHENYLEPHRINE HCL-NACL 20-0.9 MG/250ML-% IV SOLN
INTRAVENOUS | Status: DC | PRN
  Administered 2024-09-26: 30 ug/min via INTRAVENOUS

## 2024-09-26 MED ORDER — PHENOL 1.4 % MT LIQD: 1.0000 | OROMUCOSAL | Status: DC | PRN

## 2024-09-26 MED ORDER — HYDRALAZINE HCL 20 MG/ML IJ SOLN
INTRAMUSCULAR | Status: AC
  Filled 2024-09-26: qty 1

## 2024-09-26 MED ORDER — VITAMIN B-12 1000 MCG PO TABS
1000.0000 ug | ORAL_TABLET | Freq: Every day | ORAL | Status: DC
  Administered 2024-09-26 – 2024-09-29 (×4): 1000 ug via ORAL
  Filled 2024-09-26 (×4): qty 1

## 2024-09-26 MED ORDER — VANCOMYCIN HCL 1000 MG IV SOLR
INTRAVENOUS | Status: DC | PRN
  Administered 2024-09-26: 1000 mg via TOPICAL

## 2024-09-26 MED ORDER — MUPIROCIN 2 % EX OINT
1.0000 | TOPICAL_OINTMENT | Freq: Two times a day (BID) | CUTANEOUS | 0 refills | Status: AC
  Filled 2024-09-26: qty 22, 22d supply, fill #0

## 2024-09-26 MED ORDER — SURGIFLO WITH THROMBIN (HEMOSTATIC MATRIX KIT) OPTIME
TOPICAL | Status: DC | PRN
  Administered 2024-09-26: 1 via TOPICAL

## 2024-09-26 MED ORDER — PHENYLEPHRINE HCL (PRESSORS) 10 MG/ML IV SOLN
INTRAVENOUS | Status: DC | PRN
  Administered 2024-09-26 (×2): 160 ug via INTRAVENOUS

## 2024-09-26 MED ORDER — ALLOPURINOL 300 MG PO TABS
300.0000 mg | ORAL_TABLET | Freq: Every day | ORAL | Status: DC
  Administered 2024-09-26 – 2024-09-29 (×4): 300 mg via ORAL
  Filled 2024-09-26 (×4): qty 1

## 2024-09-26 MED ORDER — MAGNESIUM OXIDE 400 MG PO TABS
400.0000 mg | ORAL_TABLET | Freq: Every day | ORAL | Status: DC
  Administered 2024-09-27 – 2024-09-29 (×3): 400 mg via ORAL
  Filled 2024-09-26 (×6): qty 1

## 2024-09-26 MED ORDER — PHENYLEPHRINE 80 MCG/ML (10ML) SYRINGE FOR IV PUSH (FOR BLOOD PRESSURE SUPPORT)
PREFILLED_SYRINGE | INTRAVENOUS | Status: AC
  Filled 2024-09-26: qty 10

## 2024-09-26 MED ORDER — 0.9 % SODIUM CHLORIDE (POUR BTL) OPTIME
TOPICAL | Status: DC | PRN
  Administered 2024-09-26: 500 mL

## 2024-09-26 MED ORDER — SODIUM CHLORIDE 0.9% FLUSH
3.0000 mL | Freq: Two times a day (BID) | INTRAVENOUS | Status: DC
  Administered 2024-09-27 – 2024-09-29 (×4): 3 mL via INTRAVENOUS

## 2024-09-26 MED ORDER — FUROSEMIDE 40 MG PO TABS: 40.0000 mg | ORAL_TABLET | Freq: Every day | ORAL | Status: DC | PRN

## 2024-09-26 MED ORDER — FENTANYL CITRATE (PF) 100 MCG/2ML IJ SOLN
25.0000 ug | INTRAMUSCULAR | Status: DC | PRN
  Administered 2024-09-26: 50 ug via INTRAVENOUS
  Administered 2024-09-26 (×2): 25 ug via INTRAVENOUS

## 2024-09-26 MED ORDER — SUCCINYLCHOLINE CHLORIDE 200 MG/10ML IV SOSY
PREFILLED_SYRINGE | INTRAVENOUS | Status: DC | PRN
  Administered 2024-09-26: 100 mg via INTRAVENOUS

## 2024-09-26 MED ORDER — CHLORHEXIDINE GLUCONATE 0.12 % MT SOLN
OROMUCOSAL | Status: AC
  Filled 2024-09-26: qty 15

## 2024-09-26 MED ORDER — OXYCODONE HCL 5 MG/5ML PO SOLN
5.0000 mg | Freq: Once | ORAL | Status: AC | PRN
  Administered 2024-09-26: 5 mg via ORAL

## 2024-09-26 MED ORDER — MENTHOL 3 MG MT LOZG: 1.0000 | LOZENGE | OROMUCOSAL | Status: DC | PRN

## 2024-09-26 MED ORDER — ACETAMINOPHEN 500 MG PO TABS: 1000.0000 mg | ORAL_TABLET | Freq: Once | ORAL | Status: DC

## 2024-09-26 MED ORDER — ONDANSETRON HCL 4 MG/2ML IJ SOLN: 4.0000 mg | Freq: Four times a day (QID) | INTRAMUSCULAR | Status: DC | PRN

## 2024-09-26 MED ORDER — LIDOCAINE HCL (CARDIAC) PF 100 MG/5ML IV SOSY
PREFILLED_SYRINGE | INTRAVENOUS | Status: DC | PRN
  Administered 2024-09-26: 80 mg via INTRAVENOUS

## 2024-09-26 MED ORDER — BUPIVACAINE-EPINEPHRINE (PF) 0.5% -1:200000 IJ SOLN
INTRAMUSCULAR | Status: DC | PRN
  Administered 2024-09-26: 10 mL

## 2024-09-26 MED ORDER — VANCOMYCIN HCL 1000 MG IV SOLR
INTRAVENOUS | Status: AC
  Filled 2024-09-26: qty 20

## 2024-09-26 MED ORDER — LIDOCAINE HCL (PF) 2 % IJ SOLN
INTRAMUSCULAR | Status: AC
  Filled 2024-09-26: qty 5

## 2024-09-26 MED ORDER — REMIFENTANIL HCL 1 MG IV SOLR
INTRAVENOUS | Status: DC | PRN
  Administered 2024-09-26: .03 ug/kg/min via INTRAVENOUS

## 2024-09-26 MED ORDER — DULOXETINE HCL 30 MG PO CPEP
60.0000 mg | ORAL_CAPSULE | Freq: Two times a day (BID) | ORAL | Status: DC
  Administered 2024-09-26 – 2024-09-29 (×6): 60 mg via ORAL
  Filled 2024-09-26: qty 2
  Filled 2024-09-26: qty 1
  Filled 2024-09-26 (×3): qty 2
  Filled 2024-09-26: qty 1
  Filled 2024-09-26 (×2): qty 2

## 2024-09-26 MED ORDER — IPRATROPIUM BROMIDE 0.06 % NA SOLN
2.0000 | Freq: Two times a day (BID) | NASAL | Status: DC
  Administered 2024-09-26 – 2024-09-28 (×5): 2 via NASAL
  Filled 2024-09-26 (×2): qty 15

## 2024-09-26 MED ORDER — OXYCODONE HCL 5 MG PO TABS
5.0000 mg | ORAL_TABLET | ORAL | Status: DC | PRN
  Administered 2024-09-26: 5 mg via ORAL

## 2024-09-26 MED ORDER — METOPROLOL TARTRATE 50 MG PO TABS
100.0000 mg | ORAL_TABLET | Freq: Two times a day (BID) | ORAL | Status: DC
  Administered 2024-09-26 – 2024-09-29 (×6): 100 mg via ORAL
  Filled 2024-09-26 (×2): qty 4
  Filled 2024-09-26 (×4): qty 2

## 2024-09-26 NOTE — Op Note (Signed)
 Indications: Ms. Ajai Terhaar is a 74 y.o. female with complex spinal history.  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  She continued to follow-up with us  after she was in rehabilitation and was found to have worsening symptoms of cervical myelopathy.  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  Given her symptomatology anatomy and swallowing difficulties we planned for a posterior cervical decompression and fusion to decompress across the area of compression and to fixate her into her previous fusion construct.  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   Preoperative Note:   Risks of surgery discussed include: infection, bleeding, stroke, coma, death, paralysis, CSF leak, nerve/spinal cord injury, numbness, tingling, weakness, complex regional pain syndrome, recurrent stenosis and/or disc herniation, vascular injury, development of instability, neck/back pain, need for further surgery, persistent symptoms, development of deformity, and the risks of anesthesia. The patient understood these risks and agreed to proceed.  Operative Note:   OPERATIVE PROCEDURE:  1. Posterior Segmental Instrumentation C3-5 using Nuvasive Reline C 2. Posterolateral arthrodesis from C3-5 3. Cervical Laminectomy from C3-C4 for decompression of the spinal cord 4. Harvesting of autograft via the same incision  OPERATIVE PROCEDURE:  After induction of general anesthesia, the patient was  placed in the prone position on the operative table.  A midline incision was then planned using fluoroscopy.  A timeout was performed, and antibiotics given.  Next, the posterior cervical region was prepped and draped in the usual sterile fashion. The incision was injected with local anesthetic, then opened sharply. A subperiosteal dissection was then carried out to expose the posterior elements from C2 and C5, with careful attention paid to maintaining the C2/3 facet capsule.  After satisfactory exposure had been obtained, our attention was turned to placement of lateral mass screws.  On each side, the high speed drill was used to remove the soft tissue of the facet from C3, C4, and C5. Lateral mass screws were then placed at each level using a modified Magerl technique. Briefly, a pilot hole was drilled in the lateral mass using the high-speed drill on each side.  Next, a drill was used to drill a tract in each lateral mass to 14mm with the exception of the left C5 level. A balltip probe was used to confirm lack of breach. We then placed 3.5x 14 mm screws at C3, 3.5x 14 mm screws at C4, 3.5x 14 mm screw on the right and 12 mm screw on the left at C5.  Rods were measured and shaped, then secured to the screws according to manufacturer's specifications.  After placement of the lateral mass screws, the high speed drill was used to drill trough laminectomies from C3 to C4. The spinous processes were disconnected from the intraspinous ligaments at C2/3 and C4/5, then the ligamentum flavum was carefully divided at C2/3 and C4/5 using a Kerrison punch and upgoing curettes. The laminae and spinous processes were then removed en bloc. The decompression was completed using a Kerrison punch until the spinal cord was adequately decompressed.  The leading edge of C5 and the inferior margin of C2 were  also felt to ensure adequate decompression.   After decompression was complete, final AP and lateral radiographs were  taken.  The wound was copiously irrigated and hemostasis was achieved.  Using high-speed drill, the lateral margin of the lateral masses was gently decorticated.  The bone harvested from the laminectomy was processed and placed posterolaterally and in the facet joints to aid in arthrodesis.  The wound was closed in a multilayer fashion using interrupted 0 and 2-0 Vicryl sutures.  The final skin edges were reapproximated using staples.  After closure, the patient was flipped supine. Patient was then handed back over to anesthesia.  All counts were correct at the conclusion of the procedure.  Neurological monitoring was used throughout, and there were no changes.  Lyle Decamp acted as an designer, television/film set throughout the case. An assistant was required for this procedure due to the complexity.  The assistant provided assistance in tissue manipulation and suction, and was required for the successful and safe performance of the procedure. I performed the critical portions of the procedure.  [name] MD  Implant Name Type Inv. Item Serial No. Manufacturer Lot No. LRB No. Used Action  ALLOGRAFT BONESTRIP KORE 2.5X5 - [id number] Bone Implant ALLOGRAFT BONESTRIP KORE 2.5X5 [id number] MUSCULOSKELETL TRANSPLANT FNDN  N/A 1 Implanted  IMPL QUARTEX 3.5X14MM - [id number] Neuro Prosthesis/Implant IMPL QUARTEX 3.5X14MM  [employer]  N/A 5 Implanted  IMPL QUARTEX 3.5X12MM - [id number] Neuro Prosthesis/Implant IMPL QUARTEX 3.5X12MM  [employer]  N/A 1 Implanted  CAP LOCKING - [id number] Cap CAP LOCKING  [employer]  N/A 6 Implanted  ROD ELLIPSE 40MMX3.5 - [id number] Rod ROD ELLIPSE 40MMX3.5  [employer]  N/A 2 Implanted

## 2024-09-26 NOTE — Transfer of Care (Signed)
 Immediate Anesthesia Transfer of Care Note  Patient: Suzanne Dixon  Procedure(s) Performed: C3-4 laminectomy, C3-5 posterior instrumentation and arthrodesis, autograft harvest. (Spine Cervical)  Patient Location: PACU  Anesthesia Type:General  Level of Consciousness: drowsy and patient cooperative  Airway & Oxygen Therapy: Patient Spontanous Breathing and Patient connected to face mask oxygen  Post-op Assessment: Report given to RN and Post -op Vital signs reviewed and stable  Post vital signs: Reviewed and stable  Last Vitals:  Vitals Value Taken Time  BP 164/100 09/26/24 16:21  Temp    Pulse 68 09/26/24 16:25  Resp 18 09/26/24 16:24  SpO2 100 % 09/26/24 16:25  Vitals shown include unfiled device data.  Last Pain:  Vitals:   09/26/24 1313  TempSrc: Temporal  PainSc: 0-No pain         Complications: No notable events documented.

## 2024-09-26 NOTE — Anesthesia Postprocedure Evaluation (Signed)
"   Anesthesia Post Note  Patient: [name]  Procedure(s) Performed: C3-4 laminectomy, C3-5 posterior instrumentation and arthrodesis, autograft harvest. (Spine Cervical)  Patient location during evaluation: PACU Anesthesia Type: General Level of consciousness: awake and alert Pain management: pain level controlled Vital Signs Assessment: post-procedure vital signs reviewed and stable Respiratory status: spontaneous breathing, nonlabored ventilation, respiratory function stable and patient connected to nasal cannula oxygen Cardiovascular status: blood pressure returned to baseline and stable Postop Assessment: no apparent nausea or vomiting Anesthetic complications: no   No notable events documented.   Last Vitals:  Vitals:   09/26/24 1738 09/26/24 1748  BP: (!) 175/110   Pulse:  77  Resp:  17  Temp:  36.6 C  SpO2: 100% 100%    Last Pain:  Vitals:   09/26/24 1813  TempSrc:   PainSc: 6                  [name]      "

## 2024-09-26 NOTE — Anesthesia Procedure Notes (Signed)
 Procedure Name: Intubation Date/Time: 09/26/2024 1:47 PM  Performed by: Veronica Alm BROCKS, CRNAPre-anesthesia Checklist: Patient identified, Emergency Drugs available, Suction available and Patient being monitored Patient Re-evaluated:Patient Re-evaluated prior to induction Oxygen Delivery Method: Circle system utilized Preoxygenation: Pre-oxygenation with 100% oxygen Induction Type: IV induction Ventilation: Mask ventilation without difficulty Laryngoscope Size: McGrath, 4 and 3 Grade View: Grade I Tube type: Oral Tube size: 7.5 mm Number of attempts: 1 Airway Equipment and Method: Stylet and Oral airway Placement Confirmation: ETT inserted through vocal cords under direct vision, positive ETCO2 and breath sounds checked- equal and bilateral Secured at: 22 cm Tube secured with: Tape Dental Injury: Teeth and Oropharynx as per pre-operative assessment

## 2024-09-26 NOTE — Anesthesia Preprocedure Evaluation (Signed)
 "                                  Anesthesia Evaluation  Patient identified by MRN, date of birth, ID band Patient awake    Reviewed: Allergy & Precautions, NPO status , Patient's Chart, lab work & pertinent test results  History of Anesthesia Complications Negative for: history of anesthetic complications  Airway Mallampati: III  TM Distance: >3 FB Neck ROM: full    Dental no notable dental hx.    Pulmonary shortness of breath, sleep apnea    Pulmonary exam normal        Cardiovascular hypertension, On Medications + dysrhythmias Atrial Fibrillation      Neuro/Psych  Headaches PSYCHIATRIC DISORDERS Anxiety Depression     Neuromuscular disease CVA    GI/Hepatic Neg liver ROS,GERD  ,,  Endo/Other  Hypothyroidism  Class 3 obesity  Renal/GU      Musculoskeletal   Abdominal   Peds  Hematology  (+) Blood dyscrasia, anemia   Anesthesia Other Findings Past Medical History: No date: A-fib (HCC) No date: Anemia No date: Anxiety No date: Arthritis No date: B12 deficiency 03/31/2013: Breast cancer (HCC)     Comment:  Right - chemo- mastectomy 1991: Breast cancer (HCC)     Comment:  Rt.- radiation 01/2009: Complication of anesthesia     Comment:  Recent years with general anesthesia had itching               following surgery No date: DDD (degenerative disc disease), cervical No date: Depression No date: Diverticulitis No date: Dyspnea No date: Edema of both legs No date: GERD (gastroesophageal reflux disease) No date: Gout 06/19/2021: History of 2019 novel coronavirus disease (COVID-19) No date: Hydradenitis No date: Hypertension No date: Hypothyroidism No date: Neuropathy No date: OSA on CPAP 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) No date: Parkinson's disease (HCC) No date: Personal history of chemotherapy No date: Personal history of radiation therapy 2002: Ruptured cervical disc No date: Super obese No date: Tachycardia  Past Surgical  History: No date: ABDOMINAL HYSTERECTOMY 07/07/2023: APPLICATION OF INTRAOPERATIVE CT SCAN; N/A     Comment:  Procedure: APPLICATION OF INTRAOPERATIVE CT SCAN;                Surgeon: [name], MD;  Location: [facility];                Service: Neurosurgery;  Laterality: N/A; 2002: BACK SURGERY     Comment:  plate and 2 screws in neck 8008,7985: BREAST BIOPSY; Right     Comment:  Positive No date: BREAST CYST ASPIRATION; Left 2014: BREAST SURGERY; Right     Comment:  mastectomy 05/17/2019: CATARACT EXTRACTION W/PHACO; Left     Comment:  Procedure: CATARACT EXTRACTION PHACO AND INTRAOCULAR               LENS PLACEMENT (IOC);  Surgeon: Myrna Adine Anes, MD;                Location: [facility];  Service: Ophthalmology;  Laterality:              Left;  US  00:28 CDE 2.15 FLUID PACK LOT # [id number] H 06/14/2019: CATARACT EXTRACTION W/PHACO; Right     Comment:  Procedure: CATARACT EXTRACTION PHACO AND INTRAOCULAR               LENS PLACEMENT (IOC) RIGHT;  Surgeon: Myrna,  Adine Anes,              MD;  Location: [facility];  Service: Ophthalmology;                Laterality: Right;  US  00:35.0 CDE 2.37 Fluid Pack Lot               #[id number] H No date: CESAREAN SECTION 04/09/2012: CHOLECYSTECTOMY 06/22/2015: COLONOSCOPY WITH PROPOFOL ; N/A     Comment:  Procedure: COLONOSCOPY WITH PROPOFOL ;  Surgeon: Lamar ONEIDA Holmes, MD;  Location: [facility];  Service:               Endoscopy;  Laterality: N/A; 05/25/2021: COLONOSCOPY WITH PROPOFOL ; N/A     Comment:  Procedure: COLONOSCOPY WITH PROPOFOL ;  Surgeon:               Maryruth Ole ONEIDA, MD;  Location: [facility] ENDOS[facility];                Service: Endoscopy;  Laterality: N/A; No date: ESOPHAGOGASTRODUODENOSCOPY 05/25/2021: ESOPHAGOGASTRODUODENOSCOPY; N/A     Comment:  Procedure: ESOPHAGOGASTRODUODENOSCOPY (EGD);  Surgeon:               Maryruth Ole ONEIDA, MD;  Location: [facility];                Service: Endoscopy;  Laterality:  N/A; No date: EYE SURGERY No date: LAPAROSCOPIC GASTRIC BYPASS No date: MASTECTOMY; Right No date: MASTECTOMY MODIFIED RADICAL No date: mastectomy partial      Comment:  lumpectomy No date: NEUROPLASTY / TRANSPOSITION ULNAR NERVE AT ELBOW No date: OOPHORECTOMY 05/13/2015: PORT-A-CATH REMOVAL; Left     Comment:  Procedure: REMOVAL PORT-A-CATH LEFT CHEST ;  Surgeon:               Charlie FORBES Fell, MD;  Location: [facility];  Service:               General;  Laterality: Left; No date: REDUCTION MAMMAPLASTY; Left 12/03/2020: REVERSE SHOULDER ARTHROPLASTY; Right     Comment:  Procedure: REVERSE SHOULDER ARTHROPLASTY;  Surgeon:               Edie Norleen PARAS, MD;  Location: [facility];  Service:               Orthopedics;  Laterality: Right; 01/26/2009: ROUX-EN-Y GASTRIC BYPASS 01/03/2017: SHOULDER ARTHROSCOPY WITH OPEN ROTATOR CUFF REPAIR; Right     Comment:  Procedure: SHOULDER ARTHROSCOPY WITH OPEN ROTATOR CUFF               REPAIR;  Surgeon: Norleen PARAS Edie, MD;  Location: [facility];               Service: Orthopedics;  Laterality: Right; 01/03/2017: SHOULDER ARTHROSCOPY WITH SUBACROMIAL DECOMPRESSION,  ROTATOR CUFF REPAIR AND BICEP TENDON REPAIR; Right     Comment:  Procedure: SHOULDER ARTHROSCOPY WITH SUBACROMIAL               DECOMPRESSION, ROTATOR CUFF REPAIR AND BICEP TENDON               REPAIR;  Surgeon: Norleen PARAS Edie, MD;  Location: [facility];               Service: Orthopedics;  Laterality: Right;  Limited               debridement No date: SPINE SURGERY     Reproductive/Obstetrics negative  OB ROS                              Anesthesia Physical Anesthesia Plan  ASA: 3  Anesthesia Plan: General ETT   Post-op Pain Management: Tylenol  PO (pre-op)*, Gabapentin  PO (pre-op)*, Celebrex  PO (pre-op)* and Dilaudid  IV   Induction: Intravenous  PONV Risk Score and Plan: 3 and Ondansetron , Dexamethasone , Midazolam  and Treatment may vary due to age or medical  condition  Airway Management Planned: Oral ETT  Additional Equipment:   Intra-op Plan:   Post-operative Plan: Extubation in OR  Informed Consent: I have reviewed the patients History and Physical, chart, labs and discussed the procedure including the risks, benefits and alternatives for the proposed anesthesia with the patient or authorized representative who has indicated his/her understanding and acceptance.     Dental Advisory Given  Plan Discussed with: Anesthesiologist, CRNA and Surgeon  Anesthesia Plan Comments: (Patient consented for risks of anesthesia including but not limited to:  - adverse reactions to medications - damage to eyes, teeth, lips or other oral mucosa - nerve damage due to positioning  - sore throat or hoarseness - Damage to heart, brain, nerves, lungs, other parts of body or loss of life  Patient voiced understanding and assent.)         Anesthesia Quick Evaluation  "

## 2024-09-26 NOTE — Discharge Instructions (Signed)
 " Your surgeon has performed an operation on your cervical spine (neck) to relieve pressure on the spinal cord and/or nerves by making an incision on the back of your neck. Two rods and screws were placed to help stabilize this.   The following are instructions to help in your recovery once you have been discharged from the hospital. Even if you feel well, it is important that you follow these activity guidelines. If you do not let your neck heal properly from the surgery, you can increase the chance of return of your symptoms and other complications.  * Do not take anti-inflammatory medications for 3 months after surgery (naproxen [Aleve], ibuprofen [Advil, Motrin], celecoxib  [Celebrex ], etc.). These medications can prevent your bones from healing properly.  Activity    No bending, lifting, or twisting (BLT). Avoid lifting objects heavier than 10 pounds (gallon milk jug).  Where possible, avoid household activities that involve lifting, bending, reaching, pushing, or pulling such as laundry, vacuuming, grocery shopping, and childcare. Try to arrange for help from friends and family for these activities while your back heals.  Increase physical activity slowly as tolerated.  Taking short walks is encouraged, but avoid strenuous exercise. Do not jog, run, bicycle, lift weights, or participate in any other exercises unless specifically allowed by your doctor.  Talk to your doctor before resuming sexual activity.  You should not drive until cleared by your doctor.  Until released by your doctor, you should not return to work or school.  You should rest at home and let your body heal.   You may shower three days after your surgery.  After showering, lightly dab your incision dry. Do not take a tub bath or go swimming until approved by your doctor at your follow-up appointment.  If your doctor ordered a cervical collar (neck brace) for you, you should wear it whenever you are out of bed. You may  remove it when lying down or sleeping, but you should wear it at all other times. Not all neck surgeries require a cervical collar.  If you smoke, we strongly recommend that you quit.  Smoking has been proven to interfere with normal bone healing and will dramatically reduce the success rate of your surgery. Please contact [facility] (800-QUIT-NOW) and use the resources at www.[facility].com for assistance in stopping smoking.  Surgical Incision   If you have a dressing on your incision, you may remove it two days after your surgery. Keep your incision area clean and dry.  You have staples that will be removed at your first follow-up appointment. Diet           You may return to your usual diet. However, you may experience discomfort when swallowing in the first month after your surgery. This is normal. You may find that softer foods are more comfortable for you to swallow. Be sure to stay hydrated.  You have been prescribed narcotic pain medications.  This often will cause constipation along with the anesthesia that you underwent.  Please obtain Colace and senna. This has been sent to your local pharmacy, but at times it is not covered by insurance and you may obtain it over the counter..  You should take a stool softener and laxative for the duration of you taking the narcotic pain medications.  When to Contact Us   You may experience pain in your neck and/or pain between your shoulder blades. This is normal and should improve in the next few weeks with the help of pain medication,  muscle relaxers, and rest. Some patients report that a warm compress on the back of the neck or between the shoulder blades helps.  However, should you experience any of the following, contact us  immediately: New numbness or weakness Pain that is progressively getting worse, and is not relieved by your pain medication, muscle relaxers, rest, and warm compresses Bleeding, redness, swelling, pain, or drainage from  surgical incision Chills or flu-like symptoms Fever greater than 101.0 F (38.3 C) Inability to eat, drink fluids, or take medications Problems with bowel or bladder functions Difficulty breathing or shortness of breath Warmth, tenderness, or swelling in your calf Contact Information How to contact us :  If you have any questions/concerns before or after surgery, you can reach us  at [phone], or you can send a mychart message. We can be reached by phone or mychart 8am-4pm, Monday-Friday.  *Please note: Calls after 4pm are forwarded to a third party answering service. Mychart messages are not routinely monitored during evenings, weekends, and holidays. Please call our office to contact the answering service for urgent concerns during non-business hours.    "

## 2024-09-26 NOTE — H&P (Signed)
 "  REFERRING PHYSICIAN:  No referring provider defined for this encounter.   Discussed the use of AI scribe software for clinical note transcription with the patient, who gave verbal consent to proceed.  History of Present Illness Suzanne Dixon is a 74 year old female who presents with worsening headaches and unsteadiness. She was referred by Dr. Lovorn for evaluation of her neck issues and MRI findings, after working with her for spinal rehabilitation needs. Headaches have worsened since the time of her surgery a year ago and are often the most aggravating symptom. Unsteadiness has also increased, and she can no longer walk with her walker down the ramp to her car as she could before last year. There is a decline in arm and hand function, with increased difficulty in performing fine motor tasks such as tying or zipping. Her hands shake, making it difficult to eat or perform tasks requiring hand function. Arm weakness has progressively worsened over the past year, with increased difficulty reaching over her head. She experiences swallowing difficulties, particularly with pills, and sometimes chokes. She also experiences 'lightning strikes' or Lhermitte's phenomenon when bending her head down or back. She lives alone but has people coming in to assist her.   Physical Exam NEUROLOGICAL: Hyperreflexia in bilateral arms. Positive Hoffman sign on the left. Hand grip strength 4-/5 on right, 4 on left. Intrinsic hand weakness 4-/5 on left, 4/5 on right. Wrist extension strength 4/5. Biceps strength 4/5. Triceps strength 4/5 on left, 4/5 on right. Deltoid strength 4-/5 on left, 4-/5 on right. Decreased sensation in anterior lateral paracervical areas.  Incision well healed   ROS (Neurologic):  Negative except as noted above  IMAGING: MR CERVICAL SPINE WO CONTRAST Result Date: 06/25/2024 EXAM: MRI CERVICAL SPINE WITHOUT CONTRAST 06/21/2024 09:58:25 AM TECHNIQUE: Multiplanar multisequence MRI of the  cervical spine was performed. COMPARISON: None available. CLINICAL HISTORY: Headaches x 2 months. FINDINGS: BONES AND ALIGNMENT: Normal alignment. Normal vertebral body heights. Background Marrow Signal is unremarkable unless otherwise stated. SPINAL CORD: Abnormal narrowing of the cervical cord associated with subtle accentuated T2 signal compatible with cord edema or myelomalacia at the C3-C4 level attributable to the substantial central stenosis at this level. The remainder of the cervical cord appears normal. Ill-defined faintly accentuated T2 signal in the central pons compatible with chronic ischemic microvascular white matter disease. SOFT TISSUES: No paraspinal mass. C2-C3: No impingement. Right uncinate spurring noted along with disc bulge. C3-C4: Severe central stenosis and severe bilateral foraminal stenosis due to diffuse disc bulge, uncinate spurring, and facet arthropathy. Accentuated T2 signal in the cord at this level compatible with edema or myelomalacia. C4-C5: Borderline right foraminal stenosis due to intervertebral spurring. Fused level. Disc desiccation is noted. C5-C6: No impingement. Fused level. Solid interbody effusions are present at this level with anterior plate and screw fix. C6-C7: No impingement. Incidental small right perineural cyst. Solid anterior interbody bridging is present at this level. C7-T1: No impingement. Incidental right perineural cyst. Solid anterior interbody bridging is present at this level. T1-T2: Borderline right foraminal stenosis due to facet and uncinate spurring. Mild disc bulge. Substantial degenerative arthropathy between the skull base and the lateral masses of C1 with associated spurring. Disc desiccation at all cervical levels except for C6-C7. IMPRESSION: 1. Severe but chronic appearing central stenosis and severe bilateral foraminal stenosis at C3-C4 with associated cord edema or myelomalacia. 2. Substantial degenerative findings between the skull base  and the lateral masses of C1 with associated spurring. 3. Solid interbody fusions  at C4-C5 and C5-C6 with anterior plate and screw fixation at C5-C6. Solid anterior interbody bridging at C6-C7 and C7-T1. 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  She has had worsening examination in her upper extremities with intrinsic hand weakness deltoid weakness hyperreflexia and loss of sensation.  She also has Lhermitte's phenomenon when extending and flexing her neck.  Her ambulation has gotten worse and she is no longer to even ambulate well with her walker.  We did discuss that with her severe stenosis she is at increased paralysis risk due to spinal cord compression if she has a trauma or injury. CT scan obtained with good bony anatomy for posterior instrumentation. Surgery considered due to rapid symptom progression and neurological decline risk. - Will go forward with C3-4 Laminectomy and C3-5 Posterior instrumentation and arthrodesis   We discussed the risk and benefits of surgery as well as alternatives to therapy.  She would like to go forward with surgical treatment.  [name], MD  Department of neurosurgery "

## 2024-09-27 ENCOUNTER — Encounter: Payer: Self-pay | Admitting: Neurosurgery

## 2024-09-27 ENCOUNTER — Other Ambulatory Visit: Payer: Self-pay

## 2024-09-27 MED ORDER — GABAPENTIN 300 MG PO CAPS
ORAL_CAPSULE | ORAL | Status: AC
  Filled 2024-09-27: qty 1

## 2024-09-27 MED ORDER — ACETAMINOPHEN 500 MG PO TABS
ORAL_TABLET | ORAL | Status: AC
  Filled 2024-09-27: qty 2

## 2024-09-27 NOTE — TOC Initial Note (Signed)
 Transition of Care [facility]) - Initial/Assessment Note    Patient Details  Name: Suzanne Dixon MRN: [id number] Date of Birth: 1951/04/23  Transition of Care [facility]) CM/SW Contact:    Daved JONETTA Hamilton, RN Phone Number: 09/27/2024, 5:43 PM  Clinical Narrative:                  Spoke with patient, introduced self and explained role. Patient verbalized she is independent at home prior to hospital encounter. Patient states she has family or friends assist her with transportation when needed. Discussed with patient therapy recommendations for [facility] PT/OT once she is medically ready for discharge, patient verbalized she is current with [facility] and would like to continue with them once she gets back home. [facility] selected and completed in [place], notified Cheryl with [facility]. No DME recommended at the time of this documentation. Patient will be transported home by her friend and co-worker.  TOC signing off.   Barriers to Discharge: Continued Medical Work up   Patient Goals and CMS Choice     Choice offered to / list presented to : Patient      Expected Discharge Plan and Services   Discharge Planning Services: CM Consult   Living arrangements for the past 2 months: Single Family Home                           HH Arranged: PT, OT HH Agency: [facility] Date [facility] Agency Contacted: 09/27/24 Time HH Agency Contacted: [phone] Representative spoke with at [facility] Agency: [place]  Prior Living Arrangements/Services Living arrangements for the past 2 months: Single Family Home Lives with:: Self Patient language and need for interpreter reviewed:: Yes Do you feel safe going back to the place where you live?: Yes      Need for Family Participation in Patient Care: No (Comment) Care giver support system in place?: Yes (comment)   Criminal Activity/Legal Involvement Pertinent to Current Situation/Hospitalization: No - Comment as needed  Activities of Daily Living   ADL Screening  (condition at time of admission) Independently performs ADLs?: No Does the patient have a NEW difficulty with bathing/dressing/toileting/self-feeding that is expected to last >3 days?: Yes (Initiates electronic notice to provider for possible OT consult) Does the patient have a NEW difficulty with getting in/out of bed, walking, or climbing stairs that is expected to last >3 days?: Yes (Initiates electronic notice to provider for possible PT consult) Does the patient have a NEW difficulty with communication that is expected to last >3 days?: Yes (Initiates electronic notice to provider for possible SLP consult) Is the patient deaf or have difficulty hearing?: Yes (a little bit trouble pt stated) Does the patient have difficulty seeing, even when wearing glasses/contacts?: No Does the patient have difficulty concentrating, remembering, or making decisions?: No  Permission Sought/Granted Permission sought to share information with : Facility Medical Sales Representative, Case Manager                Emotional Assessment   Attitude/Demeanor/Rapport: Engaged Affect (typically observed): Appropriate Orientation: : Oriented to Self, Oriented to Place, Oriented to  Time, Oriented to Situation Alcohol / Substance Use: Not Applicable Psych Involvement: No (comment)  Admission diagnosis:  S/P cervical spinal fusion [Z98.1] Spondylosis, cervical, with myelopathy [M47.12] 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 with history of fusion procedure [M50.321, Z98.1] Ambulatory dysfunction [R26.2] Upper extremity weakness [R29.898] Cervical myelopathy (HCC) [G95.9] Patient Active Problem List   Diagnosis  Date Noted   Cervical myelopathy (HCC) 09/26/2024   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 with history of fusion procedure 09/05/2024   Ambulatory dysfunction 09/05/2024   Upper extremity weakness 09/05/2024   S/P cervical spinal fusion 07/31/2024   Spondylosis,  cervical, with myelopathy 07/31/2024   Cervicogenic headache 07/31/2024   Dysphagia 07/31/2024   Cervical myelopathy with cervical radiculopathy (HCC) 07/19/2024   Spasticity 07/19/2024   Abnormality of gait 07/19/2024   Generalized weakness 05/27/2024   Essential hypertension 05/27/2024   Paroxysmal atrial fibrillation (HCC) 05/27/2024   GERD without esophagitis 05/27/2024   Thoracic spondylosis with myelopathy 07/06/2023   Weakness of both lower extremities 07/06/2023   Spinal stenosis, thoracic 07/06/2023   Spinal stenosis of thoracolumbar region with radiculopathy 07/06/2023   Acute cystitis 06/29/2023   Acute back pain 06/29/2023   Pressure injury of skin 06/20/2022   Sepsis secondary to UTI (HCC) 06/19/2022   AMS (altered mental status) 06/19/2022   Parkinsonism (HCC) 05/30/2022   Acute metabolic encephalopathy 05/20/2022   Thoracic degenerative disc disease 08/24/2021   Aortic atherosclerosis 08/23/2021   COVID-19 virus infection 06/19/2021   CVA (cerebral vascular accident) (HCC) 02/01/2021   Hypothyroidism    Left leg cellulitis    Acquired thrombophilia 12/10/2020   Status post reverse total shoulder replacement, 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   Lymphedema 07/17/2020   Right rotator cuff tear arthropathy 04/07/2020   Edema of both legs 12/31/2019   Polyneuropathy associated with underlying disease 12/31/2019   Swelling of limb 12/31/2019   Hematuria, microscopic 06/07/2018   Age-related osteoporosis without current pathological fracture 12/08/2017   Rotator cuff arthropathy, right 01/03/2017   Biceps tendinitis of right upper extremity 12/05/2016   Complete tear of right rotator cuff 12/05/2016   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   History of breast cancer in female 06/02/2016   Morbid obesity (HCC) 12/01/2015   Allergy 04/01/2015   Arthritis 04/01/2015   Cancer (HCC) 04/01/2015   Edema leg 04/01/2015    Gout 04/01/2015   Hidradenitis 04/01/2015   Elevated lymphocyte count 04/01/2015   Disorder of peripheral nervous system 04/01/2015   Apnea, sleep 04/01/2015   Avitaminosis D 04/01/2015   Breast cancer (HCC) 04/01/2015   Hypertension 04/15/2014   B12 deficiency 02/24/2014   AF (paroxysmal atrial fibrillation) (HCC) 02/17/2014   Obesity (BMI 30-39.9) 06/04/2012   Depression, major, recurrent, moderate (HCC) 03/26/2012   PCP:  [name], MD Pharmacy:   [facility] [address] [place], [place] - [address] [place] [address] Phone: [phone] Fax: [phone]  [facility] - [place], [place] - [address] [place] [address] Phone: [phone] Fax: [phone]  [id number] - [place], [place] - [address] [place] [address] Phone: [phone] Fax: [phone]  [facility] [address] [place] [address] Phone: [phone] Fax: [phone]     Social Drivers of Health (SDOH) Social History: SDOH Screenings   Food Insecurity: No Food Insecurity (09/26/2024)  Housing: Low Risk (09/26/2024)  Transportation Needs: No Transportation Needs (09/26/2024)  Utilities: Not At Risk (09/26/2024)  Depression (PHQ2-9): Low Risk (12/13/2023)  Financial Resource Strain: Low Risk  (08/28/2024)   Received from [facility]  Physical Activity: Insufficiently Active (05/02/2023)   Received from [facility]  Social Connections: Unknown (09/26/2024)  Stress: Stress Concern Present (  05/02/2023)   Received from [facility]  Tobacco Use: Low Risk (09/26/2024)  Health Literacy: Inadequate Health Literacy (05/02/2023)   Received from [facility]   SDOH Interventions:     Readmission Risk Interventions     No data to display

## 2024-09-27 NOTE — Progress Notes (Signed)
 Attending Progress Note  History: Suzanne Dixon is here for C3-4 laminectomy, C3-5 posterior instrumentation and arthrodesis, autograft harvest. for Cervical myelopathy, adjacent segment disease with [name], MD. The procedure was performed on 09/26/2024, they are currently 1 Day Post-Op.   POD1: Significant neck pain overnight, improved this morning. Patient feels improved strength slightly.   Physical Exam: Vitals:   09/27/24 1559 09/27/24 2050  BP: 113/64 123/71  Pulse: 84 74  Resp: 18 16  Temp: 97.9 F (36.6 C) 98.2 F (36.8 C)  SpO2: 100% 100%    AA Ox3 CNI  Strength: Hand grip strength 4/5 on right, 4+ on left. Intrinsic hand weakness 4/5 on left, 4+/5 on right. Wrist extension strength 4+/5. Biceps strength 4+/5. Triceps strength 4+/5 on left, 4+/5 on right. Deltoid strength 4/5 on left, 4/5 on right. Antigravity in BLLE.   Data:  No results for input(s): NA, K, CL, CO2, BUN, CREATININE, LABGLOM, GLUCOSE, CALCIUM  in the last 168 hours. No results for input(s): AST, ALT, ALKPHOS in the last 168 hours.  Invalid input(s): TBILI   No results for input(s): WBC, HGB, HCT, PLT in the last 168 hours. Recent Labs  Lab 09/26/24 1253  INR 1.1        DG Cervical Spine 2-3 Views Result Date: 09/26/2024 EXAM: 2 or 3 VIEW(S) XRAY OF THE CERVICAL SPINE 09/26/2024 03:48:00 PM COMPARISON: None available. CLINICAL HISTORY: [id number] Surgery, elective [id number] Surgery, elective [id number] FINDINGS: Intraoperative fluoroscopy was utilized for surgical control purposes. Fluoroscopy time: 4 seconds. Dose: 0.332 mgy. Spot images provided: 5. Images demonstrate preexisting anterior plate and screw fixation of C5-C6. Localization marker posterior to C3. Placement of posterior rod and screw fixation from C3 through C5. IMPRESSION: 1. Intraoperative fluoroscopy for surgical control purposes, demonstrating posterior fixation of the cervical spine from C3 through C5.  Electronically signed by: Elsie Gravely MD 09/26/2024 07:45 PM EST RP Workstation: [id number]   DG C-Arm 1-60 Min-No Report Result Date: 09/26/2024 Fluoroscopy was utilized by the requesting physician.  No radiographic interpretation.   DG C-Arm 1-60 Min-No Report Result Date: 09/26/2024 Fluoroscopy was utilized by the requesting physician.  No radiographic interpretation.    Other tests/results: no oter results   Assessment/Plan: Suzanne Dixon is here for C3-4 laminectomy, C3-5 posterior instrumentation and arthrodesis, autograft harvest. for Cervical myelopathy, adjacent segment disease with [name], MD. The procedure was performed on 09/26/2024, they are currently 1 Day Post-Op.   - Drains: None - mobilize - pain control - DVT prophylaxis - PTOT - Likely DC 1/10  Penne MICAEL Claudene, MD/MSCR Department of Neurosurgery

## 2024-09-27 NOTE — Progress Notes (Signed)
 Patient transferred to Bed 136 - report given to charge nurse,

## 2024-09-27 NOTE — Evaluation (Signed)
 Physical Therapy Evaluation Patient Details Name: Suzanne Dixon MRN: [id number] DOB: 1950-11-15 Today's Date: 09/27/2024  History of Present Illness  Pt is a 74 yo female s/p C3-C4 laminectomy, C3-5 posterior arthrodesis. PMH: paroxysmal atrial fibrillation, hypertension, prior CVA, obesity, hx of breast cancer s/p chemo, mastectomy,and radiation, neuropathy, lymphedema, parkinson's disease.  Clinical Impression  Pt alert, agreeable to PT, reported 5/10 neck pain. At baseline the pt has an aide come 3x a week or PRN, able to perform dressing/participate in bathing, and modI with RW, manual or electric wheelchair depending on the day. She was able to perform bed mobility with supervision, use of bed features. Sit <> Stand with RW and CGA, and step pivoted to recliner with CGA as well. Extra time needed for all tasks but no physical assistance needed.  Overall the patient demonstrated deficits (see PT Problem List) that impede the patient's functional abilities, safety, and mobility and would benefit from skilled PT intervention.          If plan is discharge home, recommend the following: A little help with walking and/or transfers;A little help with bathing/dressing/bathroom;Assistance with cooking/housework;Assist for transportation;Help with stairs or ramp for entrance   Can travel by private vehicle        Equipment Recommendations None recommended by PT  Recommendations for Other Services       Functional Status Assessment Patient has had a recent decline in their functional status and demonstrates the ability to make significant improvements in function in a reasonable and predictable amount of time.     Precautions / Restrictions Precautions Precautions: Fall;Cervical Recall of Precautions/Restrictions: Intact Restrictions Weight Bearing Restrictions Per Provider Order: No      Mobility  Bed Mobility Overal bed mobility: Needs Assistance Bed Mobility: Supine to Sit      Supine to sit: Supervision, Used rails, HOB elevated          Transfers Overall transfer level: Needs assistance Equipment used: Rolling walker (2 wheels) Transfers: Sit to/from Stand, Bed to chair/wheelchair/BSC Sit to Stand: Contact guard assist   Step pivot transfers: Contact guard assist            Ambulation/Gait                  Stairs            Wheelchair Mobility     Tilt Bed    Modified Rankin (Stroke Patients Only)       Balance Overall balance assessment: Needs assistance Sitting-balance support: Feet supported Sitting balance-Leahy Scale: Good     Standing balance support: Bilateral upper extremity supported Standing balance-Leahy Scale: Fair                               Pertinent Vitals/Pain Pain Assessment Pain Assessment: 0-10 Pain Score: 5  Pain Location: neck Pain Descriptors / Indicators: Sore Pain Intervention(s): Limited activity within patient's tolerance, Monitored during session, Repositioned    Home Living Family/patient expects to be discharged to:: Private residence Living Arrangements: Alone Available Help at Discharge: Family;Available PRN/intermittently;Personal care attendant Type of Home: House Home Access: Ramped entrance       Home Layout: One level Home Equipment: Agricultural Consultant (2 wheels);Cane - single point;Wheelchair - manual;Shower seat;Hospital bed;Electric scooter      Prior Function Prior Level of Function : Needs assist             Mobility Comments: Pt Mod I with  transfers to W/C and [employer]. HHaide x 4 hours for, cleaning, bathing, minimal cooking and house chores. ADLs Comments: Pt has HH aide that assists her with ADLs. She endorses getting wheelchair in bathroom and able to transfer herself with grab bars onto toilet for toileting needs.     Extremity/Trunk Assessment   Upper Extremity Assessment Upper Extremity Assessment: Generalized weakness     Lower Extremity Assessment Lower Extremity Assessment: Generalized weakness (able to lift against gravity)       Communication   Communication Communication: No apparent difficulties    Cognition Arousal: Alert Behavior During Therapy: WFL for tasks assessed/performed   PT - Cognitive impairments: No apparent impairments                                 Cueing       General Comments      Exercises     Assessment/Plan    PT Assessment Patient needs continued PT services  PT Problem List Pain;Decreased strength;Decreased range of motion;Decreased activity tolerance;Decreased knowledge of use of DME;Decreased balance;Decreased mobility       PT Treatment Interventions DME instruction;Balance training;Gait training;Neuromuscular re-education;Stair training;Patient/family education;Functional mobility training;Therapeutic activities;Therapeutic exercise    PT Goals (Current goals can be found in the Care Plan section)  Acute Rehab PT Goals Patient Stated Goal: to go home PT Goal Formulation: With patient Time For Goal Achievement: 10/11/24 Potential to Achieve Goals: Good    Frequency 7X/week     Co-evaluation               AM-PAC PT 6 Clicks Mobility  Outcome Measure Help needed turning from your back to your side while in a flat bed without using bedrails?: A Little Help needed moving from lying on your back to sitting on the side of a flat bed without using bedrails?: A Little Help needed moving to and from a bed to a chair (including a wheelchair)?: A Little Help needed standing up from a chair using your arms (e.g., wheelchair or bedside chair)?: A Little Help needed to walk in hospital room?: A Little Help needed climbing 3-5 steps with a railing? : A Little 6 Click Score: 18    End of Session   Activity Tolerance: Patient tolerated treatment well Patient left: in chair;with call bell/phone within reach Nurse Communication:  Mobility status PT Visit Diagnosis: Other abnormalities of gait and mobility (R26.89);Difficulty in walking, not elsewhere classified (R26.2);Muscle weakness (generalized) (M62.81);Pain Pain - Right/Left:  (midline) Pain - part of body:  (cervical)    Time: 8984-8964 PT Time Calculation (min) (ACUTE ONLY): 20 min   Charges:   PT Evaluation $PT Eval Low Complexity: 1 Low PT Treatments $Therapeutic Activity: 8-22 mins PT General Charges $$ ACUTE PT VISIT: 1 Visit         Doyal Shams PT, DPT 1:19 PM,09/27/2024

## 2024-09-27 NOTE — Evaluation (Signed)
 Occupational Therapy Evaluation Patient Details Name: Suzanne Dixon MRN: [id number] DOB: 10-06-1950 Today's Date: 09/27/2024   History of Present Illness   Pt is a 74 yo female s/p C3-C4 laminectomy, C3-5 posterior arthrodesis. PMH: paroxysmal atrial fibrillation, hypertension, prior CVA, obesity, hx of breast cancer s/p chemo, mastectomy,and radiation, neuropathy, lymphedema, parkinson's disease.     Clinical Impressions Patient presenting with decreased Ind in self care, balance, functional mobility, self care, balance,and safety awareness. Patient reports living at home alone and has an aide that comes 3x/wk for several hours to assist with ADLs and IADLs as needed. Pt uses RW to transfer to manual or power wheelchair for mobility in home.Pt stands with min A and ambulates with CGA and use of RW for step pivot transfer into recliner chair. OT educating pt on cervical precautions related to self care needs. Patient will benefit from acute OT to increase overall independence in the areas of ADLs, functional mobility, and safety awareness in order to safely discharge.     If plan is discharge home, recommend the following:   A little help with walking and/or transfers;A little help with bathing/dressing/bathroom;Assistance with cooking/housework;Help with stairs or ramp for entrance;Assist for transportation     Functional Status Assessment   Patient has had a recent decline in their functional status and demonstrates the ability to make significant improvements in function in a reasonable and predictable amount of time.     Equipment Recommendations   None recommended by OT      Precautions/Restrictions   Precautions Precautions: Fall;Cervical Recall of Precautions/Restrictions: Intact     Mobility Bed Mobility Overal bed mobility: Needs Assistance Bed Mobility: Supine to Sit     Supine to sit: Supervision, Used rails, HOB elevated          Transfers Overall  transfer level: Needs assistance Equipment used: Rolling walker (2 wheels) Transfers: Sit to/from Stand, Bed to chair/wheelchair/BSC Sit to Stand: Contact guard assist     Step pivot transfers: Contact guard assist            Balance Overall balance assessment: Needs assistance Sitting-balance support: Feet supported Sitting balance-Leahy Scale: Good     Standing balance support: Bilateral upper extremity supported Standing balance-Leahy Scale: Fair                             ADL either performed or assessed with clinical judgement   ADL Overall ADL's : Needs assistance/impaired                         Toilet Transfer: Minimal assistance Toilet Transfer Details (indicate cue type and reason): simulated                 Vision Patient Visual Report: No change from baseline              Pertinent Vitals/Pain Pain Assessment Pain Assessment: Faces Faces Pain Scale: Hurts little more Pain Location: neck Pain Descriptors / Indicators: Sore, Discomfort Pain Intervention(s): Limited activity within patient's tolerance, Monitored during session, Repositioned     Extremity/Trunk Assessment Upper Extremity Assessment Upper Extremity Assessment: Generalized weakness   Lower Extremity Assessment Lower Extremity Assessment: Generalized weakness       Communication Communication Communication: No apparent difficulties   Cognition Arousal: Alert Behavior During Therapy: WFL for tasks assessed/performed Cognition: No apparent impairments  Following commands: Intact                  Home Living Family/patient expects to be discharged to:: Private residence Living Arrangements: Alone Available Help at Discharge: Family;Available PRN/intermittently;Personal care attendant Type of Home: House Home Access: Ramped entrance     Home Layout: One level     Bathroom Shower/Tub: Retail Banker: Handicapped height     Home Equipment: Agricultural Consultant (2 wheels);Cane - single point;Wheelchair - manual;Shower seat;Hospital bed;Electric scooter          Prior Functioning/Environment Prior Level of Function : Needs assist             Mobility Comments: Pt Mod I with transfers to W/C and [employer]. HHaide x 4 hours for, cleaning, bathing, minimal cooking and house chores. ADLs Comments: Pt has HH aide that assists her with ADLs. She endorses getting wheelchair in bathroom and able to transfer herself with grab bars onto toilet for toileting needs. She is able to wash and dress herself on the days aide is not there.    OT Problem List: Decreased strength;Decreased safety awareness;Decreased activity tolerance;Impaired balance (sitting and/or standing);Decreased coordination   OT Treatment/Interventions: Self-care/ADL training;Therapeutic activities;Therapeutic exercise;Neuromuscular education;Patient/family education;Energy conservation;Balance training      OT Goals(Current goals can be found in the care plan section)   Acute Rehab OT Goals Patient Stated Goal: to go home OT Goal Formulation: With patient Time For Goal Achievement: 10/11/24 Potential to Achieve Goals: Fair ADL Goals Pt Will Perform Grooming: with modified independence;sitting Pt Will Perform Lower Body Dressing: with supervision;sit to/from stand;with adaptive equipment Pt Will Transfer to Toilet: with supervision;squat pivot transfer Pt Will Perform Toileting - Clothing Manipulation and hygiene: with supervision;sit to/from stand   OT Frequency:  Min 2X/week       AM-PAC OT 6 Clicks Daily Activity     Outcome Measure Help from another person eating meals?: None Help from another person taking care of personal grooming?: None Help from another person toileting, which includes using toliet, bedpan, or urinal?: A Little Help from another person bathing (including  washing, rinsing, drying)?: A Little Help from another person to put on and taking off regular upper body clothing?: None Help from another person to put on and taking off regular lower body clothing?: A Little 6 Click Score: 21   End of Session Equipment Utilized During Treatment: Rolling walker (2 wheels) Nurse Communication: Mobility status  Activity Tolerance: Patient tolerated treatment well Patient left: with bed alarm set;with chair alarm set;in chair  OT Visit Diagnosis: Unsteadiness on feet (R26.81);Repeated falls (R29.6);Muscle weakness (generalized) (M62.81)                Time: 1020-1036 OT Time Calculation (min): 16 min Charges:  OT General Charges $OT Visit: 1 Visit OT Evaluation $OT Eval Moderate Complexity: 1 [address] [place], OTR/L , [facility]IS ascom [phone]  09/27/2024, 1:40 PM

## 2024-09-27 NOTE — Plan of Care (Signed)
" °  Problem: Clinical Measurements: Goal: Postoperative complications will be avoided or minimized Outcome: Progressing   Problem: Activity: Goal: Will remain free from falls Outcome: Progressing   Problem: Pain Management: Goal: Pain level will decrease Outcome: Progressing   "

## 2024-09-27 NOTE — Plan of Care (Signed)
" °  Problem: Respiratory: Goal: Will regain and/or maintain adequate ventilation Outcome: Progressing Goal: Respiratory status will improve Outcome: Progressing   Problem: Education: Goal: Ability to verbalize activity precautions or restrictions will improve Outcome: Progressing   Problem: Pain Management: Goal: Pain level will decrease Outcome: Progressing   Problem: Bladder/Genitourinary: Goal: Urinary functional status for postoperative course will improve Outcome: Progressing   "

## 2024-09-28 ENCOUNTER — Other Ambulatory Visit: Payer: Self-pay

## 2024-09-28 NOTE — Plan of Care (Signed)
" °  Problem: Bowel/Gastric: Goal: Gastrointestinal status for postoperative course will improve Outcome: Progressing   Problem: Cardiac: Goal: Ability to maintain an adequate cardiac output Outcome: Progressing   Problem: Nutritional: Goal: Will attain and maintain optimal nutritional status Outcome: Progressing   Problem: Neurological: Goal: Will regain or maintain usual level of consciousness Outcome: Progressing   Problem: Respiratory: Goal: Will regain and/or maintain adequate ventilation Outcome: Progressing   Problem: Skin Integrity: Goal: Demonstrates signs of wound healing without infection Outcome: Progressing   Problem: Education: Goal: Ability to verbalize activity precautions or restrictions will improve Outcome: Progressing   "

## 2024-09-28 NOTE — Progress Notes (Signed)
 Attending Progress Note  History: Suzanne Dixon is here for C3-4 laminectomy, C3-5 posterior instrumentation and arthrodesis, autograft harvest. for Cervical myelopathy, adjacent segment disease with Suzanne Dixon ORN, MD. The procedure was performed on 09/26/2024, they are currently 2 Days Post-Op.   POD2: Doing much better POD1: Significant neck pain overnight, improved this morning. Patient feels improved strength slightly.   Physical Exam: Vitals:   09/28/24 0512 09/28/24 0751  BP: 117/63 126/73  Pulse: 70 72  Resp: 18 17  Temp: 97.6 F (36.4 C) (!) 97.4 F (36.3 C)  SpO2: 100% 100%    AA Ox3 CNI  Strength: Hand grip strength 4+/5 on right, 4+ on left. Intrinsic hand weakness 4/5 on left, 4+/5 on right. Wrist extension strength 4+/5. Biceps strength 4+/5. Triceps strength 4+/5 on left, 4+/5 on right. Deltoid strength 4/5 on left, 4/5 on right. Antigravity in BLLE.   Dressing c/d/i  Data:  No results for input(s): NA, K, CL, CO2, BUN, CREATININE, LABGLOM, GLUCOSE, CALCIUM  in the last 168 hours. No results for input(s): AST, ALT, ALKPHOS in the last 168 hours.  Invalid input(s): TBILI   No results for input(s): WBC, HGB, HCT, PLT in the last 168 hours. Recent Labs  Lab 09/26/24 1253  INR 1.1        DG Cervical Spine 2-3 Views Result Date: 09/26/2024 EXAM: 2 or 3 VIEW(S) XRAY OF THE CERVICAL SPINE 09/26/2024 03:48:00 PM COMPARISON: None available. CLINICAL HISTORY: [id number] Surgery, elective [id number] Surgery, elective [phone] FINDINGS: Intraoperative fluoroscopy was utilized for surgical control purposes. Fluoroscopy time: 4 seconds. Dose: 0.332 mgy. Spot images provided: 5. Images demonstrate preexisting anterior plate and screw fixation of C5-C6. Localization marker posterior to C3. Placement of posterior rod and screw fixation from C3 through C5. IMPRESSION: 1. Intraoperative fluoroscopy for surgical control purposes, demonstrating posterior  fixation of the cervical spine from C3 through C5. Electronically signed by: Elsie Gravely MD 09/26/2024 07:45 PM EST RP Workstation: [id number]   DG C-Arm 1-60 Min-No Report Result Date: 09/26/2024 Fluoroscopy was utilized by the requesting physician.  No radiographic interpretation.   DG C-Arm 1-60 Min-No Report Result Date: 09/26/2024 Fluoroscopy was utilized by the requesting physician.  No radiographic interpretation.    Other tests/results: no other results   Assessment/Plan: Suzanne Dixon is here for C3-4 laminectomy, C3-5 posterior instrumentation and arthrodesis, autograft harvest. for Cervical myelopathy, adjacent segment disease with Suzanne Dixon ORN, MD. The procedure was performed on 09/26/2024, they are currently 2 Days Post-Op.   - Drains: None - mobilize - pain control - DVT prophylaxis - PTOT - Change Dressing today  [name] MD Department of Neurosurgery

## 2024-09-28 NOTE — Progress Notes (Signed)
 Physical Therapy Treatment Patient Details Name: Suzanne Dixon MRN: [id number] DOB: 02-28-1951 Today's Date: 09/28/2024   History of Present Illness Pt is a 74 yo female s/p C3-C4 laminectomy, C3-5 posterior arthrodesis. PMH: paroxysmal atrial fibrillation, hypertension, prior CVA, obesity, hx of breast cancer s/p chemo, mastectomy,and radiation, neuropathy, lymphedema, parkinson's disease.    PT Comments  Pt progressed with gait, ambulating 35' with RW, CGA level.  Pt continues to experience limitations to mobility, demonstrating decreased activity tolerance, general weakness, standing imbalance, and pain requiring that pt be at a CGA level for safety.  Pt will benefit from continued PT services upon discharge to safely address deficits listed in patient problem list for decreased caregiver assistance and eventual return to PLOF.      If plan is discharge home, recommend the following: A little help with walking and/or transfers;A little help with bathing/dressing/bathroom;Assistance with cooking/housework;Assist for transportation;Help with stairs or ramp for entrance   Can travel by private vehicle        Equipment Recommendations  None recommended by PT    Recommendations for Other Services       Precautions / Restrictions Precautions Precautions: Fall;Cervical Recall of Precautions/Restrictions: Intact Restrictions Weight Bearing Restrictions Per Provider Order: No     Mobility  Bed Mobility   Bed Mobility: Supine to Sit     Supine to sit: Supervision, Used rails, HOB elevated          Transfers Overall transfer level: Needs assistance Equipment used: Rolling walker (2 wheels) Transfers: Sit to/from Stand, Bed to chair/wheelchair/BSC Sit to Stand: Contact guard assist   Step pivot transfers: Contact guard assist            Ambulation/Gait Ambulation/Gait assistance: Contact guard assist Gait Distance (Feet): 35 Feet Assistive device: Rolling walker (2  wheels) Gait Pattern/deviations: Step-through pattern, Decreased stride length, Trunk flexed       General Gait Details: slow but steady movement.   Stairs             Wheelchair Mobility     Tilt Bed    Modified Rankin (Stroke Patients Only)       Balance Overall balance assessment: Needs assistance Sitting-balance support: Feet supported Sitting balance-Leahy Scale: Good     Standing balance support: Bilateral upper extremity supported Standing balance-Leahy Scale: Fair Standing balance comment: LOB posteriorly in standing x1 requiring Min A to correct                            Communication Communication Communication: No apparent difficulties  Cognition Arousal: Alert Behavior During Therapy: WFL for tasks assessed/performed   PT - Cognitive impairments: No apparent impairments                         Following commands: Intact      Cueing    Exercises      General Comments        Pertinent Vitals/Pain Pain Assessment Faces Pain Scale: Hurts whole lot Pain Location: neck Pain Descriptors / Indicators: Sore, Discomfort Pain Intervention(s): Limited activity within patient's tolerance, Monitored during session, Premedicated before session    Home Living                          Prior Function            PT Goals (current goals can now be found in the  care plan section) Acute Rehab PT Goals Patient Stated Goal: to go home PT Goal Formulation: With patient Time For Goal Achievement: 10/11/24 Potential to Achieve Goals: Good Progress towards PT goals: Progressing toward goals    Frequency    7X/week      PT Plan      Co-evaluation              AM-PAC PT 6 Clicks Mobility   Outcome Measure  Help needed turning from your back to your side while in a flat bed without using bedrails?: A Little Help needed moving from lying on your back to sitting on the side of a flat bed without using  bedrails?: A Little Help needed moving to and from a bed to a chair (including a wheelchair)?: A Little Help needed standing up from a chair using your arms (e.g., wheelchair or bedside chair)?: A Little Help needed to walk in hospital room?: A Little Help needed climbing 3-5 steps with a railing? : A Little 6 Click Score: 18    End of Session   Activity Tolerance: Patient tolerated treatment well Patient left: in chair;with call bell/phone within reach Nurse Communication: Mobility status PT Visit Diagnosis: Other abnormalities of gait and mobility (R26.89);Difficulty in walking, not elsewhere classified (R26.2);Muscle weakness (generalized) (M62.81);Pain Pain - Right/Left:  (midline) Pain - part of body:  (neck)     Time: 8969-8945 PT Time Calculation (min) (ACUTE ONLY): 24 min  Charges:    $Therapeutic Activity: 23-37 mins PT General Charges $$ ACUTE PT VISIT: 1 Visit                    Harland Irving, PTA  09/28/2024, 11:05 AM

## 2024-09-28 NOTE — Plan of Care (Signed)
" °  Problem: Education: Goal: Knowledge of the prescribed therapeutic regimen will improve Outcome: Progressing   Problem: Bowel/Gastric: Goal: Gastrointestinal status for postoperative course will improve Outcome: Progressing   Problem: Cardiac: Goal: Ability to maintain an adequate cardiac output Outcome: Progressing Goal: Will show no evidence of cardiac arrhythmias Outcome: Progressing   Problem: Nutritional: Goal: Will attain and maintain optimal nutritional status Outcome: Progressing   Problem: Neurological: Goal: Will regain or maintain usual level of consciousness Outcome: Progressing   Problem: Clinical Measurements: Goal: Ability to maintain clinical measurements within normal limits Outcome: Progressing Goal: Postoperative complications will be avoided or minimized Outcome: Progressing   Problem: Respiratory: Goal: Will regain and/or maintain adequate ventilation Outcome: Progressing Goal: Respiratory status will improve Outcome: Progressing   Problem: Skin Integrity: Goal: Demonstrates signs of wound healing without infection Outcome: Progressing   Problem: Urinary Elimination: Goal: Will remain free from infection Outcome: Progressing Goal: Ability to achieve and maintain adequate urine output Outcome: Progressing   Problem: Education: Goal: Ability to verbalize activity precautions or restrictions will improve Outcome: Progressing Goal: Knowledge of the prescribed therapeutic regimen will improve Outcome: Progressing Goal: Understanding of discharge needs will improve Outcome: Progressing   Problem: Activity: Goal: Ability to avoid complications of mobility impairment will improve Outcome: Progressing Goal: Ability to tolerate increased activity will improve Outcome: Progressing Goal: Will remain free from falls Outcome: Progressing   Problem: Bowel/Gastric: Goal: Gastrointestinal status for postoperative course will improve Outcome:  Progressing   Problem: Clinical Measurements: Goal: Ability to maintain clinical measurements within normal limits will improve Outcome: Progressing Goal: Postoperative complications will be avoided or minimized Outcome: Progressing Goal: Diagnostic test results will improve Outcome: Progressing   Problem: Pain Management: Goal: Pain level will decrease Outcome: Progressing   Problem: Skin Integrity: Goal: Will show signs of wound healing Outcome: Progressing   Problem: Health Behavior/Discharge Planning: Goal: Identification of resources available to assist in meeting health care needs will improve Outcome: Progressing   Problem: Bladder/Genitourinary: Goal: Urinary functional status for postoperative course will improve Outcome: Progressing   Problem: Education: Goal: Knowledge of General Education information will improve Description: Including pain rating scale, medication(s)/side effects and non-pharmacologic comfort measures Outcome: Progressing   Problem: Health Behavior/Discharge Planning: Goal: Ability to manage health-related needs will improve Outcome: Progressing   Problem: Clinical Measurements: Goal: Ability to maintain clinical measurements within normal limits will improve Outcome: Progressing Goal: Will remain free from infection Outcome: Progressing Goal: Diagnostic test results will improve Outcome: Progressing Goal: Respiratory complications will improve Outcome: Progressing Goal: Cardiovascular complication will be avoided Outcome: Progressing   Problem: Activity: Goal: Risk for activity intolerance will decrease Outcome: Progressing   Problem: Nutrition: Goal: Adequate nutrition will be maintained Outcome: Progressing   Problem: Coping: Goal: Level of anxiety will decrease Outcome: Progressing   Problem: Elimination: Goal: Will not experience complications related to bowel motility Outcome: Progressing Goal: Will not experience  complications related to urinary retention Outcome: Progressing   Problem: Pain Managment: Goal: General experience of comfort will improve and/or be controlled Outcome: Progressing   Problem: Safety: Goal: Ability to remain free from injury will improve Outcome: Progressing   Problem: Skin Integrity: Goal: Risk for impaired skin integrity will decrease Outcome: Progressing   "

## 2024-09-29 ENCOUNTER — Other Ambulatory Visit: Payer: Self-pay

## 2024-09-29 MED ORDER — OXYCODONE HCL 5 MG PO TABS
5.0000 mg | ORAL_TABLET | ORAL | 0 refills | Status: AC | PRN
  Filled 2024-09-29: qty 30, 4d supply, fill #0

## 2024-09-29 MED ORDER — METHOCARBAMOL 500 MG PO TABS
500.0000 mg | ORAL_TABLET | Freq: Four times a day (QID) | ORAL | 0 refills | Status: AC | PRN
  Filled 2024-09-29: qty 60, 15d supply, fill #0

## 2024-09-29 MED ORDER — POLYETHYLENE GLYCOL 3350 17 GM/SCOOP PO POWD
17.0000 g | Freq: Every day | ORAL | 0 refills | Status: AC | PRN
  Filled 2024-09-29: qty 238, 14d supply, fill #0

## 2024-09-29 NOTE — Plan of Care (Signed)
" °  Problem: Education: Goal: Knowledge of the prescribed therapeutic regimen will improve Outcome: Progressing   Problem: Cardiac: Goal: Ability to maintain an adequate cardiac output Outcome: Progressing   Problem: Nutritional: Goal: Will attain and maintain optimal nutritional status Outcome: Progressing   Problem: Respiratory: Goal: Will regain and/or maintain adequate ventilation Outcome: Progressing   Problem: Skin Integrity: Goal: Demonstrates signs of wound healing without infection Outcome: Progressing   Problem: Education: Goal: Knowledge of the prescribed therapeutic regimen will improve Outcome: Progressing   Problem: Activity: Goal: Ability to avoid complications of mobility impairment will improve Outcome: Progressing   "

## 2024-09-29 NOTE — Progress Notes (Signed)
 Attending Progress Note  History: Suzanne Dixon is here for C3-4 laminectomy, C3-5 posterior instrumentation and arthrodesis, autograft harvest. for Cervical myelopathy, adjacent segment disease with [name], MD. The procedure was performed on 09/26/2024, they are currently 3 Days Post-Op.   POD3: Needs BM POD2: Doing much better POD1: Significant neck pain overnight, improved this morning. Patient feels improved strength slightly.   Physical Exam: Vitals:   09/29/24 0357 09/29/24 0934  BP: 128/72 (!) 160/87  Pulse: 72 83  Resp: 18 17  Temp: 97.7 F (36.5 C) 98.2 F (36.8 C)  SpO2: 99% 100%    AA Ox3 CNI  Strength: Hand grip strength 4+/5 on right, 4+ on left. Intrinsic hand weakness 4/5 on left, 4+/5 on right. Wrist extension strength 4+/5. Biceps strength 4+/5. Triceps strength 4+/5 on left, 4+/5 on right. Deltoid strength 4/5 on left, 4/5 on right. Antigravity in BLLE.   Dressing c/d/i  Data:  No results for input(s): NA, K, CL, CO2, BUN, CREATININE, LABGLOM, GLUCOSE, CALCIUM  in the last 168 hours. No results for input(s): AST, ALT, ALKPHOS in the last 168 hours.  Invalid input(s): TBILI   No results for input(s): WBC, HGB, HCT, PLT in the last 168 hours. Recent Labs  Lab 09/26/24 1253  INR 1.1        No results found.   Other tests/results: no other results   Assessment/Plan: [name] is here for C3-4 laminectomy, C3-5 posterior instrumentation and arthrodesis, autograft harvest. for Cervical myelopathy, adjacent segment disease with [name], MD. The procedure was performed on 09/26/2024, they are currently 3 Days Post-Op.   - Drains: None - mobilize - pain control - DVT prophylaxis - PTOT - BM today  [name] MD Department of Neurosurgery

## 2024-09-29 NOTE — Progress Notes (Addendum)
 Physical Therapy Treatment Patient Details Name: Suzanne Dixon MRN: [id number] DOB: June 05, 1951 Today's Date: 09/29/2024   History of Present Illness Pt is a 74 yo female s/p C3-C4 laminectomy, C3-5 posterior arthrodesis. PMH: paroxysmal atrial fibrillation, hypertension, prior CVA, obesity, hx of breast cancer s/p chemo, mastectomy,and radiation, neuropathy, lymphedema, parkinson's disease.    PT Comments  Pt on BSC upon arrival with successful large BM.  Assisted with care.  She stated she is too fatigued to walk after BM and asks to return to bed and walk later.  Min a x 1 to settle in bed.  Returned about an hour later and she is able to get OOB and walk to door and back - self selected - then opts to remain in chair.  RN notified of request for pain medication.    Pt stated she remains comfortable with discharge home.  Stated she has an aide that lives 4 minutes away and will help.  Encouraged +1 at home for general safety.  Pt stated she has a ramp at home to access her home.   If plan is discharge home, recommend the following: A little help with walking and/or transfers;A little help with bathing/dressing/bathroom;Assistance with cooking/housework;Assist for transportation;Help with stairs or ramp for entrance   Can travel by private vehicle        Equipment Recommendations  None recommended by PT    Recommendations for Other Services       Precautions / Restrictions Precautions Precautions: Fall;Cervical Recall of Precautions/Restrictions: Intact Restrictions Weight Bearing Restrictions Per Provider Order: No     Mobility  Bed Mobility Overal bed mobility: Needs Assistance Bed Mobility: Supine to Sit, Sit to Supine     Supine to sit: Used rails, Supervision, HOB elevated Sit to supine: Contact guard assist, Min assist, HOB elevated, Used rails   General bed mobility comments: light assist to reposition in supine Patient Response: Cooperative  Transfers Overall  transfer level: Needs assistance Equipment used: Rolling walker (2 wheels) Transfers: Sit to/from Stand Sit to Stand: Contact guard assist                Ambulation/Gait Ambulation/Gait assistance: Contact guard assist Gait Distance (Feet): 30 Feet Assistive device: Rolling walker (2 wheels) Gait Pattern/deviations: Step-through pattern, Decreased stride length, Trunk flexed Gait velocity: dec     General Gait Details: slow but steady movement.   Stairs             Wheelchair Mobility     Tilt Bed Tilt Bed Patient Response: Cooperative  Modified Rankin (Stroke Patients Only)       Balance Overall balance assessment: Needs assistance Sitting-balance support: Feet supported Sitting balance-Leahy Scale: Good     Standing balance support: Bilateral upper extremity supported Standing balance-Leahy Scale: Fair                              Hotel Manager: No apparent difficulties  Cognition Arousal: Alert Behavior During Therapy: WFL for tasks assessed/performed   PT - Cognitive impairments: No apparent impairments                         Following commands: Intact      Cueing Cueing Techniques: Verbal cues  Exercises Other Exercises Other Exercises: BSC large formed soft BM    General Comments        Pertinent Vitals/Pain Pain Assessment Pain Assessment: 0-10 Pain Score:  5  Pain Location: neck Pain Descriptors / Indicators: Sore, Discomfort Pain Intervention(s): Limited activity within patient's tolerance, Monitored during session, Repositioned, Patient requesting pain meds-RN notified    Home Living                          Prior Function            PT Goals (current goals can now be found in the care plan section) Progress towards PT goals: Progressing toward goals    Frequency    7X/week      PT Plan      Co-evaluation              AM-PAC PT 6 Clicks  Mobility   Outcome Measure  Help needed turning from your back to your side while in a flat bed without using bedrails?: A Little Help needed moving from lying on your back to sitting on the side of a flat bed without using bedrails?: A Little Help needed moving to and from a bed to a chair (including a wheelchair)?: A Little Help needed standing up from a chair using your arms (e.g., wheelchair or bedside chair)?: A Little Help needed to walk in hospital room?: A Little Help needed climbing 3-5 steps with a railing? : A Little 6 Click Score: 18    End of Session Equipment Utilized During Treatment: Gait belt Activity Tolerance: Patient tolerated treatment well Patient left: in chair;with call bell/phone within reach Nurse Communication: Mobility status PT Visit Diagnosis: Other abnormalities of gait and mobility (R26.89);Difficulty in walking, not elsewhere classified (R26.2);Muscle weakness (generalized) (M62.81);Pain     Time: 8955-8890 PT Time Calculation (min) (ACUTE ONLY): 25 min  Charges:    $Gait Training: 8-22 mins $Therapeutic Activity: 8-22 mins PT General Charges $$ ACUTE PT VISIT: 1 Visit                   Lauraine Gills, PTA 09/29/2024, 11:17 AM

## 2024-09-29 NOTE — Plan of Care (Signed)

## 2024-09-29 NOTE — Progress Notes (Signed)
 Reviewed discharge instructions with patient. Patient acknowledged understanding. Patient received meds at bedside. Patient discharged with personal belongings. Patient wheeled out by staff. Patient transported home via family vehicle. No distress noted in patient.

## 2024-09-29 NOTE — Discharge Summary (Signed)
 Physician Discharge Summary  Patient ID: Suzanne Dixon MRN: [id number] DOB/AGE: [birth year] 76 y.o.  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  She tolerated the procedure well and worked with PTOT.  She was stable for discharge on POD3.  Consults: None  Significant Diagnostic Studies: radiology: X-Ray: correct placement of implants  Treatments: surgery: C3-4 decompression, C3-5 instrumentation  Discharge Exam: Blood pressure (!) 160/87, pulse 83, temperature 98.2 F (36.8 C), resp. rate 17, height 5' 4 (1.626 m), weight 104.8 kg, SpO2 100%. General appearance: alert and cooperative MAEW with near full strength  Incision c/d/i  Disposition: home with home health  Discharge Instructions     Incentive spirometry RT   Complete by: As directed       Allergies as of 09/29/2024       Reactions   Advair Diskus [fluticasone-salmeterol] Other (See Comments)   Joint pain    Alendronate    Muscle pain   Singulair [montelukast] Other (See Comments)   muscle pain   Sulfa Antibiotics Hives   Venlafaxine Other (See Comments)   altered mental status/tremors        Medication List     STOP taking these medications    baclofen  10 MG tablet Commonly known as: LIORESAL    diclofenac Sodium 1 % Gel Commonly known as: VOLTAREN   meloxicam  15 MG tablet Commonly known as: MOBIC    warfarin 1 MG tablet Commonly known as: COUMADIN    warfarin 6 MG tablet Commonly known as: COUMADIN        TAKE these medications     allopurinol  300 MG tablet Commonly known as: ZYLOPRIM  Take 300 mg by mouth daily.   Calcium  Carb-Cholecalciferol  600-400 MG-UNIT Tabs Take 1 tablet by mouth 2 (two) times daily with a meal.   chlorhexidine  4 % external liquid Commonly known as: HIBICLENS  Apply 15 mLs (1 Application total) topically as directed for 30 doses. Use as directed daily for 5 days every other week for 6 weeks.   cyanocobalamin  1000 MCG tablet Commonly known as: VITAMIN B12 Take 1,000 mcg by mouth daily.   DULoxetine  60 MG capsule Commonly known as: CYMBALTA  Take 60 mg by mouth 2 (two) times daily.   estradiol 0.01 % Crea vaginal cream Commonly known as: ESTRACE Place 0.25 Applicatorfuls vaginally once a week. Apply to peri urethral area once a week   FLUoxetine  10 MG tablet Commonly known as: PROZAC  Take 10 mg by mouth daily.   furosemide  40 MG tablet Commonly known as: LASIX  Take 40 mg by mouth daily as needed for edema.   gabapentin  300 MG capsule Commonly known as: NEURONTIN  Take 1 capsule (300 mg total) by mouth 4 (four) times daily.   ipratropium 0.06 % nasal spray Commonly known as: ATROVENT  Place 2 sprays into both nostrils 2 (two) times daily. For 7 days   levothyroxine  50 MCG tablet Commonly known as: SYNTHROID  Take 50 mcg by mouth daily before breakfast.   magnesium  oxide 400 MG tablet Commonly known as: MAG-OX Take 1 tablet by mouth daily.   methocarbamol  500 MG tablet Commonly known as: ROBAXIN  Take  1 tablet (500 mg total) by mouth every 6 (six) hours as needed for muscle spasms. What changed: reasons to take this   metoprolol  tartrate 100 MG tablet Commonly known as: LOPRESSOR  Take 100 mg by mouth 2 (two) times daily.   mupirocin  ointment 2 % Commonly known as: BACTROBAN  Place 1 Application into the nose 2 (two) times daily for 60 doses. Use as directed 2 times daily for 5 days every other week for 6 weeks.   omeprazole 20 MG capsule Commonly known as:  PRILOSEC Take 20 mg by mouth daily.   oxyCODONE  5 MG immediate release tablet Commonly known as: Oxy IR/ROXICODONE  Take 1 tablet (5 mg total) by mouth every 3 (three) hours as needed for moderate pain (pain score 4-6).   polyethylene glycol 17 g packet Commonly known as: MIRALAX  / GLYCOLAX  Take 17 g by mouth daily as needed for mild constipation.   potassium chloride  10 MEQ tablet Commonly known as: KLOR-CON  Take 10 mEq by mouth daily as needed ((furosemide )).   sertraline  50 MG tablet Commonly known as: ZOLOFT  Take 50 mg by mouth daily.   solifenacin 5 MG tablet Commonly known as: VESICARE Take 5 mg by mouth daily.        Contact information for after-discharge care     Home Medical Care     [facility]) .   Service: Home Health Services                     Signed: [name] 09/29/2024, 9:58 AM

## 2024-09-30 ENCOUNTER — Other Ambulatory Visit: Payer: Self-pay

## 2024-10-01 ENCOUNTER — Telehealth: Payer: Self-pay | Admitting: Neurosurgery

## 2024-10-01 NOTE — Telephone Encounter (Signed)
 Pt is experiencing extreme neck pain, the medication works for a short amount of time. It makes her very sleepy & the pain goes right back. They are wanting to know if there are any other options or what would provide any type of relief. Pt says that they felt like they couldn't go home as quick as they did, and they may want to try rehab. Pt also states that they are having weakness in her neck.   Pt says the wound is covered, and they have a wash that they are cleaning with. Does the wound need to be covered back up after cleaning?

## 2024-10-01 NOTE — Telephone Encounter (Signed)
 DOS: 09/26/24 C3-4 laminectomy, C3-5 posterior instrumentation and arthrodesis, autograft harvest with Dr. Claudene Fortis patient to follow up on complaints of pain. She states this pain is different than prior to surgery and is localized to the surgical site. She is a poor historian with medication management and states that she has trouble with her medicine and it is usually pre-packaged.   When I talked to the patient, her aide was present, who stated that patient is usually really good about her medications. She states patient lives alone and an aide is not there 24/7. Questioned if rehab was still an option. Informed her to check with her insurance to see if this was an option as many plans require additional hospital stay once discharged.   Advised patient to take medications as listed below and if this does not improve her pain to give us  a call back.   Current Regimen: Tylenol  - not taking. Advised to take 1000mg  every 6-8 hours.  Cymbalta  - not taking - advised to take 60mg  BID Gabapentin  300mg  - unsure how often she has taken it. Advised to take 4 times a day.  Robaxin  - unsure. Advised to take 500mg  every 6 hours Oxycodone  - unsure how often she is taking it. Advised it is prescrtibed 5mg  every 3 hours - complains she starts falling asleep as soon as the meds hit her system, wakes up hurting again.  Ice/heat - Advised to utilize ice or heat 15 minutes on 15 minutes off.   Patient and aide additionally asked about directions for CHG bathing. They were both under the impression that this was to be used as a wound wash. Provided correct instructions and referred them to AVS.

## 2024-10-02 ENCOUNTER — Other Ambulatory Visit: Payer: Self-pay

## 2024-10-08 ENCOUNTER — Ambulatory Visit: Admitting: Physician Assistant

## 2024-10-08 ENCOUNTER — Encounter: Payer: Self-pay | Admitting: Physician Assistant

## 2024-10-08 VITALS — BP 134/82 | Temp 97.8°F | Ht 64.0 in | Wt 231.0 lb

## 2024-10-08 DIAGNOSIS — Z981 Arthrodesis status: Secondary | ICD-10-CM

## 2024-10-08 DIAGNOSIS — G959 Disease of spinal cord, unspecified: Secondary | ICD-10-CM

## 2024-10-08 NOTE — Progress Notes (Unsigned)
" ° °  REFERRING PHYSICIAN:  [name], [place] [address] [facility] [place],  [place] [address]  DOS: 09/27/23 C3-4 lami, C3-5 instrumentation  HISTORY OF PRESENT ILLNESS: Suzanne Dixon is 2 weeks status post C3-4 Lami, C3-5 posterior fusion. Overall, she is doing extremely well.  She does have some pain and stiffness in her trap muscles as expected, but is able to manage this without pain medication.     PHYSICAL EXAMINATION:  NEUROLOGICAL:  General: In no acute distress.   Awake, alert, oriented to person, place, and time.  Pupils equal round and reactive to light.  Facial tone is symmetric.    Strength: Patient largely demonstrates 4+/5 strength to bilateral upper extremities.  Incision c/d/I.  Small area at the distal aspect of her incision that is continuing to heal.  Imaging:  No interval imaging  Assessment / Plan: Suzanne Dixon is doing well after C3-4 Lami, C3-5 posterior fusion.  She is doing extremely well with her pain.  I encouraged her to use pain medication if she has any trouble.  We discussed activity escalation and I have advised the patient to lift up to 10 pounds until 6 weeks after surgery, then increase up to 25 pounds until 12 weeks after surgery.  After 12 weeks post-op, the patient advised to increase activity as tolerated. she will return to clinic in approximately 1 month for her 6-week postop visit.  Advised patient to continue with her postop precautions.  Some Medihoney and extra bandages were also given to the patient to use.    Advised to contact the office if any questions or concerns arise.   Lyle Decamp PA-C Dept of Neurosurgery   "

## 2024-10-18 ENCOUNTER — Encounter: Attending: Physical Medicine and Rehabilitation | Admitting: Physical Medicine and Rehabilitation

## 2024-10-30 ENCOUNTER — Other Ambulatory Visit

## 2024-10-30 ENCOUNTER — Encounter: Admitting: Neurosurgery

## 2024-12-09 ENCOUNTER — Encounter: Admitting: Physician Assistant

## 2024-12-09 ENCOUNTER — Other Ambulatory Visit

## 2024-12-16 ENCOUNTER — Other Ambulatory Visit

## 2024-12-16 ENCOUNTER — Encounter: Admitting: Physician Assistant

## 2024-12-18 ENCOUNTER — Other Ambulatory Visit

## 2024-12-18 ENCOUNTER — Encounter: Admitting: Physician Assistant

## 2025-02-24 ENCOUNTER — Ambulatory Visit (INDEPENDENT_AMBULATORY_CARE_PROVIDER_SITE_OTHER): Admitting: Nurse Practitioner
